# Patient Record
Sex: Female | Born: 1945
Health system: Southern US, Community
[De-identification: ages and names within clinical notes are randomized; demographics above are authoritative.]

## PROBLEM LIST (undated history)

## (undated) DIAGNOSIS — N189 Chronic kidney disease, unspecified: Secondary | ICD-10-CM

## (undated) DIAGNOSIS — Z8719 Personal history of other diseases of the digestive system: Secondary | ICD-10-CM

## (undated) DIAGNOSIS — K5792 Diverticulitis of intestine, part unspecified, without perforation or abscess without bleeding: Secondary | ICD-10-CM

## (undated) DIAGNOSIS — I8290 Acute embolism and thrombosis of unspecified vein: Secondary | ICD-10-CM

## (undated) DIAGNOSIS — K630 Abscess of intestine: Secondary | ICD-10-CM

## (undated) DIAGNOSIS — R42 Dizziness and giddiness: Secondary | ICD-10-CM

## (undated) DIAGNOSIS — K759 Inflammatory liver disease, unspecified: Secondary | ICD-10-CM

## (undated) DIAGNOSIS — H43399 Other vitreous opacities, unspecified eye: Secondary | ICD-10-CM

## (undated) DIAGNOSIS — R011 Cardiac murmur, unspecified: Secondary | ICD-10-CM

## (undated) DIAGNOSIS — J449 Chronic obstructive pulmonary disease, unspecified: Secondary | ICD-10-CM

## (undated) DIAGNOSIS — M1990 Unspecified osteoarthritis, unspecified site: Secondary | ICD-10-CM

## (undated) DIAGNOSIS — D573 Sickle-cell trait: Secondary | ICD-10-CM

## (undated) DIAGNOSIS — Z87440 Personal history of urinary (tract) infections: Secondary | ICD-10-CM

## (undated) DIAGNOSIS — E039 Hypothyroidism, unspecified: Secondary | ICD-10-CM

## (undated) DIAGNOSIS — Z9289 Personal history of other medical treatment: Secondary | ICD-10-CM

## (undated) DIAGNOSIS — I509 Heart failure, unspecified: Secondary | ICD-10-CM

## (undated) DIAGNOSIS — H269 Unspecified cataract: Secondary | ICD-10-CM

## (undated) DIAGNOSIS — E7800 Pure hypercholesterolemia, unspecified: Secondary | ICD-10-CM

## (undated) DIAGNOSIS — K219 Gastro-esophageal reflux disease without esophagitis: Secondary | ICD-10-CM

## (undated) DIAGNOSIS — J189 Pneumonia, unspecified organism: Secondary | ICD-10-CM

## (undated) DIAGNOSIS — F329 Major depressive disorder, single episode, unspecified: Secondary | ICD-10-CM

## (undated) DIAGNOSIS — E669 Obesity, unspecified: Secondary | ICD-10-CM

## (undated) DIAGNOSIS — R51 Headache: Secondary | ICD-10-CM

## (undated) DIAGNOSIS — I10 Essential (primary) hypertension: Secondary | ICD-10-CM

## (undated) DIAGNOSIS — F32A Depression, unspecified: Secondary | ICD-10-CM

## (undated) HISTORY — DX: Abscess of intestine: K63.0

## (undated) HISTORY — DX: Diverticulitis of intestine, part unspecified, without perforation or abscess without bleeding: K57.92

## (undated) HISTORY — DX: Heart failure, unspecified: I50.9

## (undated) HISTORY — DX: Obesity, unspecified: E66.9

## (undated) HISTORY — DX: Personal history of other medical treatment: Z92.89

## (undated) HISTORY — DX: Gastro-esophageal reflux disease without esophagitis: K21.9

---
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
## 1966-09-14 DIAGNOSIS — I8290 Acute embolism and thrombosis of unspecified vein: Secondary | ICD-10-CM

## 1966-09-14 HISTORY — DX: Acute embolism and thrombosis of unspecified vein: I82.90

## 2000-09-14 HISTORY — PX: CHOLECYSTECTOMY: SHX55

## 2007-04-07 LAB — HM COLONOSCOPY: HM Colonoscopy: NORMAL

## 2011-11-13 LAB — HM MAMMOGRAPHY

## 2013-02-20 ENCOUNTER — Encounter (HOSPITAL_COMMUNITY): Payer: Self-pay

## 2013-02-20 ENCOUNTER — Emergency Department (HOSPITAL_COMMUNITY)
Admission: EM | Admit: 2013-02-20 | Discharge: 2013-02-20 | Disposition: A | Discharge status: Home / Self Care | Payer: Medicare Other | Attending: Emergency Medicine | Admitting: Emergency Medicine

## 2013-02-20 DIAGNOSIS — R609 Edema, unspecified: Secondary | ICD-10-CM | POA: Insufficient documentation

## 2013-02-20 DIAGNOSIS — E039 Hypothyroidism, unspecified: Secondary | ICD-10-CM | POA: Diagnosis not present

## 2013-02-20 DIAGNOSIS — E7800 Pure hypercholesterolemia, unspecified: Secondary | ICD-10-CM | POA: Insufficient documentation

## 2013-02-20 DIAGNOSIS — D573 Sickle-cell trait: Secondary | ICD-10-CM | POA: Diagnosis not present

## 2013-02-20 DIAGNOSIS — R252 Cramp and spasm: Secondary | ICD-10-CM | POA: Insufficient documentation

## 2013-02-20 DIAGNOSIS — R110 Nausea: Secondary | ICD-10-CM | POA: Insufficient documentation

## 2013-02-20 DIAGNOSIS — F17200 Nicotine dependence, unspecified, uncomplicated: Secondary | ICD-10-CM | POA: Insufficient documentation

## 2013-02-20 DIAGNOSIS — R55 Syncope and collapse: Secondary | ICD-10-CM | POA: Insufficient documentation

## 2013-02-20 DIAGNOSIS — E876 Hypokalemia: Secondary | ICD-10-CM | POA: Diagnosis not present

## 2013-02-20 DIAGNOSIS — R635 Abnormal weight gain: Secondary | ICD-10-CM | POA: Insufficient documentation

## 2013-02-20 DIAGNOSIS — R61 Generalized hyperhidrosis: Secondary | ICD-10-CM | POA: Insufficient documentation

## 2013-02-20 DIAGNOSIS — I10 Essential (primary) hypertension: Secondary | ICD-10-CM | POA: Insufficient documentation

## 2013-02-20 DIAGNOSIS — R42 Dizziness and giddiness: Secondary | ICD-10-CM | POA: Diagnosis not present

## 2013-02-20 DIAGNOSIS — R5381 Other malaise: Secondary | ICD-10-CM | POA: Diagnosis not present

## 2013-02-20 DIAGNOSIS — R112 Nausea with vomiting, unspecified: Secondary | ICD-10-CM | POA: Diagnosis not present

## 2013-02-20 DIAGNOSIS — F329 Major depressive disorder, single episode, unspecified: Secondary | ICD-10-CM | POA: Diagnosis not present

## 2013-02-20 DIAGNOSIS — Z86718 Personal history of other venous thrombosis and embolism: Secondary | ICD-10-CM | POA: Diagnosis not present

## 2013-02-20 DIAGNOSIS — F3289 Other specified depressive episodes: Secondary | ICD-10-CM | POA: Insufficient documentation

## 2013-02-20 DIAGNOSIS — R404 Transient alteration of awareness: Secondary | ICD-10-CM | POA: Diagnosis not present

## 2013-02-20 DIAGNOSIS — R011 Cardiac murmur, unspecified: Secondary | ICD-10-CM | POA: Diagnosis not present

## 2013-02-20 HISTORY — DX: Pure hypercholesterolemia, unspecified: E78.00

## 2013-02-20 HISTORY — DX: Hypothyroidism, unspecified: E03.9

## 2013-02-20 HISTORY — DX: Depression, unspecified: F32.A

## 2013-02-20 HISTORY — DX: Major depressive disorder, single episode, unspecified: F32.9

## 2013-02-20 HISTORY — DX: Essential (primary) hypertension: I10

## 2013-02-20 HISTORY — DX: Sickle-cell trait: D57.3

## 2013-02-20 LAB — CBC WITH DIFFERENTIAL/PLATELET
Basophils Absolute: 0 10*3/uL (ref 0.0–0.1)
Basophils Relative: 0 % (ref 0–1)
Eosinophils Absolute: 0.2 10*3/uL (ref 0.0–0.7)
Eosinophils Relative: 2 % (ref 0–5)
HCT: 34.6 % — ABNORMAL LOW (ref 36.0–46.0)
Hemoglobin: 12.3 g/dL (ref 12.0–15.0)
Lymphocytes Relative: 15 % (ref 12–46)
Lymphs Abs: 1.4 10*3/uL (ref 0.7–4.0)
MCH: 33.5 pg (ref 26.0–34.0)
MCHC: 35.5 g/dL (ref 30.0–36.0)
MCV: 94.3 fL (ref 78.0–100.0)
Monocytes Absolute: 0.4 10*3/uL (ref 0.1–1.0)
Monocytes Relative: 4 % (ref 3–12)
Neutro Abs: 7.5 10*3/uL (ref 1.7–7.7)
Neutrophils Relative %: 79 % — ABNORMAL HIGH (ref 43–77)
Platelets: 202 10*3/uL (ref 150–400)
RBC: 3.67 MIL/uL — ABNORMAL LOW (ref 3.87–5.11)
RDW: 14.5 % (ref 11.5–15.5)
WBC: 9.5 10*3/uL (ref 4.0–10.5)

## 2013-02-20 LAB — URINALYSIS, ROUTINE W REFLEX MICROSCOPIC
Bilirubin Urine: NEGATIVE
Glucose, UA: NEGATIVE mg/dL
Hgb urine dipstick: NEGATIVE
Ketones, ur: NEGATIVE mg/dL
Nitrite: NEGATIVE
Protein, ur: NEGATIVE mg/dL
Specific Gravity, Urine: 1.009 (ref 1.005–1.030)
Urobilinogen, UA: 0.2 mg/dL (ref 0.0–1.0)
pH: 7 (ref 5.0–8.0)

## 2013-02-20 LAB — COMPREHENSIVE METABOLIC PANEL
ALT: 57 U/L — ABNORMAL HIGH (ref 0–35)
AST: 68 U/L — ABNORMAL HIGH (ref 0–37)
Albumin: 4.3 g/dL (ref 3.5–5.2)
Alkaline Phosphatase: 92 U/L (ref 39–117)
BUN: 10 mg/dL (ref 6–23)
CO2: 27 mEq/L (ref 19–32)
Calcium: 10.1 mg/dL (ref 8.4–10.5)
Chloride: 97 mEq/L (ref 96–112)
Creatinine, Ser: 1.65 mg/dL — ABNORMAL HIGH (ref 0.50–1.10)
GFR calc Af Amer: 36 mL/min — ABNORMAL LOW (ref >90)
GFR calc non Af Amer: 31 mL/min — ABNORMAL LOW (ref >90)
Glucose, Bld: 88 mg/dL (ref 70–99)
Potassium: 3.2 mEq/L — ABNORMAL LOW (ref 3.5–5.1)
Sodium: 139 mEq/L (ref 135–145)
Total Bilirubin: 1.1 mg/dL (ref 0.3–1.2)
Total Protein: 8.3 g/dL (ref 6.0–8.3)

## 2013-02-20 LAB — URINE MICROSCOPIC-ADD ON

## 2013-02-20 LAB — TROPONIN I: Troponin I: <0.3 ng/mL (ref <0.30)

## 2013-02-20 LAB — MAGNESIUM: Magnesium: 2.3 mg/dL (ref 1.5–2.5)

## 2013-02-20 LAB — PRO B NATRIURETIC PEPTIDE: Pro B Natriuretic peptide (BNP): 9.6 pg/mL (ref 0–125)

## 2013-02-20 MED ORDER — SODIUM CHLORIDE 0.9 % IV BOLUS (SEPSIS)
1000.0000 mL | Freq: Once | INTRAVENOUS | Status: AC
  Administered 2013-02-20: 1000 mL via INTRAVENOUS

## 2013-02-20 MED ORDER — POTASSIUM CHLORIDE CRYS ER 20 MEQ PO TBCR
40.0000 meq | EXTENDED_RELEASE_TABLET | Freq: Once | ORAL | Status: AC
  Administered 2013-02-20: 40 meq via ORAL
  Filled 2013-02-20: qty 2

## 2013-02-20 NOTE — ED Notes (Signed)
PND:LO31<AF> Expected date:<BR> Expected time:<BR> Means of arrival:<BR> Comments:<BR> Ems/ weakness,

## 2013-02-20 NOTE — ED Provider Notes (Addendum)
History     CSN: [id number]  Arrival date & time 02/20/13  1308   First MD Initiated Contact with Patient 02/20/13 [phone]      Chief Complaint  Patient presents with  . Dizziness  . Nausea    (Consider location/radiation/quality/duration/timing/severity/associated sxs/prior treatment) HPI Comments: Pt comes in with cc of dizziness. Pt has thyroid hx, HL, smoking hx. She is not hypertensive. The only meds she was on was a thyroid meds, she has not take it in a few days. Pt started having some diffuse swelling of her body, with weight gain, over the past few weeks. Pt started taking lasix 40 mg q daily for it. Today, she took 80 mg total. Reports going to the bathroom, and on her way, getting nauseated, dizzy - described as spinning and lightheadedness, got sweaty, and fell like she was going to loose balance, so she slowly sat down. She then urinated, and felt weak - called daughter. EMS had patient noted ot be in the 90s SBP at their arrival. She feels a lot better - EMS provided some fluids. She also c/o diffuse muscle cramps.  The history is provided by the patient.    Past Medical History  Diagnosis Date  . Hypertension   . Hypothyroid   . Depression   . Hypercholesteremia   . Sickle cell trait   . Embolus     Past Surgical History  Procedure Laterality Date  . Cholecystectomy    . Tonsillectomy      Family History  Problem Relation Age of Onset  . Hypertension Mother   . Aneurysm Mother   . Cancer Mother   . Cancer Other     History  Substance Use Topics  . Smoking status: Current Every Day Smoker -- 0.25 packs/day for 25 years    Types: Cigarettes  . Smokeless tobacco: Never Used  . Alcohol Use: No    OB History   Grav Para Term Preterm Abortions TAB SAB Ect Mult Living                  Review of Systems  Constitutional: Negative for activity change.  HENT: Negative for facial swelling and neck pain.   Respiratory: Negative for cough, shortness of  breath and wheezing.   Cardiovascular: Negative for chest pain and palpitations.  Gastrointestinal: Negative for nausea, vomiting, abdominal pain, diarrhea, constipation, blood in stool and abdominal distention.  Genitourinary: Negative for hematuria and difficulty urinating.  Skin: Negative for color change.  Neurological: Positive for dizziness and weakness. Negative for seizures, syncope and speech difficulty.  Hematological: Does not bruise/bleed easily.  Psychiatric/Behavioral: Negative for confusion.    Allergies  Estrogens  Home Medications  No current outpatient prescriptions on file.  BP 123/82  Pulse 88  Temp(Src) 97.4 F (36.3 C) (Oral)  Resp 18  SpO2 96%  Physical Exam  Nursing note and vitals reviewed. Constitutional: She is oriented to person, place, and time. She appears well-developed and well-nourished.  HENT:  Head: Normocephalic and atraumatic.  Eyes: EOM are normal. Pupils are equal, round, and reactive to light.  Neck: Neck supple. No JVD present.  Cardiovascular: Normal rate, regular rhythm and intact distal pulses.   Murmur heard. Pulmonary/Chest: Effort normal. No respiratory distress.  Abdominal: Soft. She exhibits no distension and no mass. There is no tenderness. There is no rebound and no guarding.  Musculoskeletal:  No pitting edema, symmetric swelling - if present.  Neurological: She is alert and oriented to person, place,  and time. No cranial nerve deficit. Coordination normal.  Cerebellar exam is normal (finger to nose) Sensory exam normal for bilateral upper and lower extremities - and patient is able to discriminate between sharp and dull. Motor exam is 4+/5   Skin: Skin is warm and dry.    ED Course  Procedures (including critical care time)  Labs Reviewed  CBC WITH DIFFERENTIAL  URINALYSIS, ROUTINE W REFLEX MICROSCOPIC  TROPONIN I  MAGNESIUM  COMPREHENSIVE METABOLIC PANEL  PRO B NATRIURETIC PEPTIDE  TSH   No results  found.   No diagnosis found.    MDM   Date: 02/20/2013  Rate: 67  Rhythm: normal sinus rhythm  QRS Axis: normal  Intervals: normal  ST/T Wave abnormalities: nonspecific ST/T changes  Conduction Disutrbances:none  Narrative Interpretation:   Old EKG Reviewed: none available  Pt comes in with cc of dizziness- near syncope and diffuse swelling x few months.  DDx includes: Orthostatic hypotension Stroke Vertebral artery dissection/stenosis Dysrhythmia PE Vasovagal/neurocardiogenic syncope Aortic stenosis Valvular disorder/Cardiomyopathy Anemia   Pt appeared to have had an orthostatic event or neurocardiogenic / vasovagal event. The prodrome appears to make the latter more likely. Low Bp at EMS arrival, makes dehydration and orthostatics possible as well - especially in light of her self medicating.  Finally thyroid dx is possible cause of the swelling.  We will hydrate, get basic labs and screening ekg and torponin. Pt has an appt with PCP in 1 week. Will get TSH ordered  - pcp can follow it.  I dont think this was a TIA event - her ABCD2 score would be 1 if it was, appropriate for outpatient workup.     Varney Biles, MD 02/20/13 [place], MD 02/20/13 1736

## 2013-02-20 NOTE — Progress Notes (Signed)
   CARE MANAGEMENT ED NOTE 02/20/2013  Patient:  Valerie Jackson, Valerie Jackson   Account Number:  [id number]  Date Initiated:  02/20/2013  Documentation initiated by:  Livia Snellen  Subjective/Objective Assessment:   Patient presented to ED with swelling to face and hands.     Subjective/Objective Assessment Detail:     Action/Plan:   Action/Plan Detail:   Anticipated DC Date:       Status Recommendation to Physician:   Result of Recommendation:    Other ED [place]  Other  PCP issues    Choice offered to / List presented to:            Status of service:  Completed, signed off  ED Comments:   ED Comments Detail:  Patient listed as not having a PCP.  EDCM spoke with patient who confirmed that she did not have a PCP as of yet since she just moved here a few months ago from [place]. Patient stated that she has an appointment with the nurse practioner at [facility] on [address] (she could not remember the name of the NP).  Patinet intends on making this group her PCP.  No further needs at this time.

## 2013-02-20 NOTE — ED Notes (Signed)
Per EMS- Patient c/o having swelling to hands and face. Patient self-medicated with Lasix 40 mg at 0700 and repeated at 1140. Patient then began to  Have dizziness, muscle cramping, and nausea. Patient denies COPD and CHF history. Patient given 437m NS IV per EMS.

## 2013-02-21 LAB — TSH: TSH: 67.77 u[IU]/mL — ABNORMAL HIGH (ref 0.350–4.500)

## 2013-02-27 ENCOUNTER — Ambulatory Visit (INDEPENDENT_AMBULATORY_CARE_PROVIDER_SITE_OTHER): Payer: Medicare Other | Admitting: Internal Medicine

## 2013-02-27 ENCOUNTER — Encounter: Payer: Self-pay | Admitting: Internal Medicine

## 2013-02-27 ENCOUNTER — Other Ambulatory Visit (INDEPENDENT_AMBULATORY_CARE_PROVIDER_SITE_OTHER): Payer: Medicare Other

## 2013-02-27 VITALS — BP 122/88 | HR 78 | Temp 97.6°F | Ht 65.5 in | Wt 206.8 lb

## 2013-02-27 DIAGNOSIS — I10 Essential (primary) hypertension: Secondary | ICD-10-CM

## 2013-02-27 DIAGNOSIS — E785 Hyperlipidemia, unspecified: Secondary | ICD-10-CM

## 2013-02-27 DIAGNOSIS — E876 Hypokalemia: Secondary | ICD-10-CM | POA: Diagnosis not present

## 2013-02-27 DIAGNOSIS — E039 Hypothyroidism, unspecified: Secondary | ICD-10-CM | POA: Diagnosis not present

## 2013-02-27 DIAGNOSIS — R739 Hyperglycemia, unspecified: Secondary | ICD-10-CM

## 2013-02-27 DIAGNOSIS — R600 Localized edema: Secondary | ICD-10-CM

## 2013-02-27 DIAGNOSIS — R7309 Other abnormal glucose: Secondary | ICD-10-CM | POA: Diagnosis not present

## 2013-02-27 DIAGNOSIS — R609 Edema, unspecified: Secondary | ICD-10-CM

## 2013-02-27 LAB — COMPREHENSIVE METABOLIC PANEL
ALT: 55 U/L — ABNORMAL HIGH (ref 0–35)
AST: 69 U/L — ABNORMAL HIGH (ref 0–37)
Albumin: 4.1 g/dL (ref 3.5–5.2)
Alkaline Phosphatase: 66 U/L (ref 39–117)
BUN: 9 mg/dL (ref 6–23)
CO2: 27 mEq/L (ref 19–32)
Calcium: 10 mg/dL (ref 8.4–10.5)
Chloride: 105 mEq/L (ref 96–112)
Creatinine, Ser: 1.9 mg/dL — ABNORMAL HIGH (ref 0.4–1.2)
GFR: 33.81 mL/min — ABNORMAL LOW (ref >60.00)
Glucose, Bld: 91 mg/dL (ref 70–99)
Potassium: 3.1 mEq/L — ABNORMAL LOW (ref 3.5–5.1)
Sodium: 142 mEq/L (ref 135–145)
Total Bilirubin: 0.9 mg/dL (ref 0.3–1.2)
Total Protein: 7.3 g/dL (ref 6.0–8.3)

## 2013-02-27 LAB — CBC
HCT: 30.8 % — ABNORMAL LOW (ref 36.0–46.0)
Hemoglobin: 10.5 g/dL — ABNORMAL LOW (ref 12.0–15.0)
MCHC: 34.2 g/dL (ref 30.0–36.0)
MCV: 98.9 fl (ref 78.0–100.0)
Platelets: 188 10*3/uL (ref 150.0–400.0)
RBC: 3.11 Mil/uL — ABNORMAL LOW (ref 3.87–5.11)
RDW: 15 % — ABNORMAL HIGH (ref 11.5–14.6)
WBC: 6.9 10*3/uL (ref 4.5–10.5)

## 2013-02-27 LAB — T4, FREE: Free T4: 0 ng/dL — ABNORMAL LOW (ref 0.60–1.60)

## 2013-02-27 LAB — LIPID PANEL
Cholesterol: 338 mg/dL — ABNORMAL HIGH (ref 0–200)
HDL: 48 mg/dL (ref >39.00)
Total CHOL/HDL Ratio: 7
Triglycerides: 309 mg/dL — ABNORMAL HIGH (ref 0.0–149.0)
VLDL: 61.8 mg/dL — ABNORMAL HIGH (ref 0.0–40.0)

## 2013-02-27 LAB — T3: T3, Total: <10 ng/dL — ABNORMAL LOW (ref 80.0–204.0)

## 2013-02-27 LAB — HEMOGLOBIN A1C: Hgb A1c MFr Bld: 4.7 % (ref 4.6–6.5)

## 2013-02-27 LAB — LDL CHOLESTEROL, DIRECT: Direct LDL: 226 mg/dL

## 2013-02-27 NOTE — Progress Notes (Signed)
HPI  Pt presents to the clinic today to establish care. She recently moved from [place]. She has not seen her PCP in [place] for a year prior to moving. She does have some concerns today about total body swelling. This started approximately 1 month ago. She self medicated with Lasix which did seem to bring some of the swelling down. She was seen in the ER 02/20/2013 and found to be hypotensive, hypokalemic and dehydrated likely due to overuse of Lasix which was not prescribed to her. She reports that she does not understand the reason for the swelling. She has no known heart issue, having a normal echo in 2011. She has never had any renal insufficiency. Additionally, she also has a history of hypothyroidism. She self d/c'd meds in Sept 2013. At the ER recently her TSH was 67.  Flu: 2012 TD: 2013 Pneumovax: never Eye doctor: yearly Dentist: yearly Pap Smear: no longer screening Mammogram: 2013 Colonoscopy: more than 5 years ago (does not want to repeat)  Past Medical History  Diagnosis Date  . Hypertension   . Hypothyroid   . Depression   . Hypercholesteremia   . Sickle cell trait   . Embolus   . GERD (gastroesophageal reflux disease)   . History of blood transfusion     No current outpatient prescriptions on file.   No current facility-administered medications for this visit.    Allergies  Allergen Reactions  . Estrogens     Blood clot in leg    Family History  Problem Relation Age of Onset  . Hypertension Mother   . Aneurysm Mother   . Cancer Mother     Uterine  . Thyroid cancer Mother   . Cancer Other   . Thyroid cancer Sister   . Arthritis Sister   . Hyperlipidemia Sister   . Hypertension Sister   . Stroke Sister   . Kidney disease Sister   . Hyperlipidemia Brother   . Thyroid cancer Daughter   . Breast cancer Maternal Grandmother   . Hypertension Maternal Grandmother     History   Social History  . Marital Status: Married    Spouse Name: N/A    Number  of Children: 1  . Years of Education: 14   Occupational History  . Retired     Therapist, sports   Social History Main Topics  . Smoking status: Current Every Day Smoker -- 0.25 packs/day for 25 years    Types: Cigarettes  . Smokeless tobacco: Never Used  . Alcohol Use: No  . Drug Use: No  . Sexually Active: Not on file   Other Topics Concern  . Not on file   Social History Narrative   Regular exercise-no   Caffeine Use-yes    ROS:  Constitutional: Denies fever, malaise, fatigue, headache or abrupt weight changes.  HEENT: Pt reports floaters. Denies eye pain, eye redness, ear pain, ringing in the ears, wax buildup, runny nose, nasal congestion, bloody nose, or sore throat. Respiratory: Denies difficulty breathing, shortness of breath, cough or sputum production.   Cardiovascular: Pt reports generalized swelling. Denies chest pain, chest tightness, palpitations.  Gastrointestinal: Denies abdominal pain, bloating, constipation, diarrhea or blood in the stool.  GU: Denies frequency, urgency, pain with urination, blood in urine, odor or discharge. Musculoskeletal: Denies decrease in range of motion, difficulty with gait, muscle pain or joint pain and swelling.  Skin: Denies redness, rashes, lesions or ulcercations.  Neurological: Denies dizziness, difficulty with memory, difficulty with speech or problems  with balance and coordination.   No other specific complaints in a complete review of systems (except as listed in HPI above).  PE:  BP 122/88  Pulse 78  Temp(Src) 97.6 F (36.4 C) (Oral)  Ht 5' 5.5" (1.664 m)  Wt 206 lb 12 oz (93.781 kg)  BMI 33.87 kg/m2  SpO2 96% Wt Readings from Last 3 Encounters:  02/27/13 206 lb 12 oz (93.781 kg)    General: Appears her stated age, obese but  well developed, well nourished in NAD. HEENT: Head: normal shape and size; Eyes: sclera white, no icterus, conjunctiva pink, PERRLA and EOMs intact; Ears: Tm's gray and intact, normal light reflex; Nose:  mucosa pink and moist, septum midline; Throat/Mouth: Teeth present, mucosa pink and moist, no lesions or ulcerations noted.  Neck: Normal range of motion. Neck supple, trachea midline. No massses, lumps or thyromegaly present.  Cardiovascular: Normal rate and rhythm. S1,S2 noted.  No murmur, rubs or gallops noted. No JVD, 1+ pitting edema of BLE.[name] No carotid bruits noted. Pulmonary/Chest: Normal effort and positive vesicular breath sounds. No respiratory distress. No wheezes, rales or ronchi noted.  Abdomen: Soft and nontender. Normal bowel sounds, no bruits noted. No distention or masses noted. Liver, spleen and kidneys non palpable. Musculoskeletal: Normal range of motion. No signs of joint swelling. No difficulty with gait.  Neurological: Alert and oriented. Cranial nerves II-XII intact. Coordination normal. +DTRs bilaterally. Psychiatric: Mood and affect normal. Behavior is normal. Judgment and thought content normal.     BMET    Component Value Date/Time   NA 139 02/20/2013 1600   K 3.2* 02/20/2013 1600   CL 97 02/20/2013 1600   CO2 27 02/20/2013 1600   GLUCOSE 88 02/20/2013 1600   BUN 10 02/20/2013 1600   CREATININE 1.65* 02/20/2013 1600   CALCIUM 10.1 02/20/2013 1600   GFRNONAA 31* 02/20/2013 1600   GFRAA 36* 02/20/2013 1600    Lipid Panel  No results found for this basename: chol, trig, hdl, cholhdl, vldl, ldlcalc    CBC    Component Value Date/Time   WBC 9.5 02/20/2013 1600   RBC 3.67* 02/20/2013 1600   HGB 12.3 02/20/2013 1600   HCT 34.6* 02/20/2013 1600   PLT 202 02/20/2013 1600   MCV 94.3 02/20/2013 1600   MCH 33.5 02/20/2013 1600   MCHC 35.5 02/20/2013 1600   RDW 14.5 02/20/2013 1600   LYMPHSABS 1.4 02/20/2013 1600   MONOABS 0.4 02/20/2013 1600   EOSABS 0.2 02/20/2013 1600   BASOSABS 0.0 02/20/2013 1600    Hgb A1C No results found for this basename: HGBA1C     Assessment and Plan:  Preventative Health:  Pt declines repeat colonoscopy, pap smear or pneumovax Continue to work on diet and  exercise

## 2013-02-27 NOTE — Patient Instructions (Signed)

## 2013-02-28 ENCOUNTER — Telehealth: Payer: Self-pay | Admitting: *Deleted

## 2013-02-28 DIAGNOSIS — E785 Hyperlipidemia, unspecified: Secondary | ICD-10-CM | POA: Insufficient documentation

## 2013-02-28 DIAGNOSIS — I10 Essential (primary) hypertension: Secondary | ICD-10-CM | POA: Insufficient documentation

## 2013-02-28 DIAGNOSIS — E039 Hypothyroidism, unspecified: Secondary | ICD-10-CM | POA: Insufficient documentation

## 2013-02-28 DIAGNOSIS — E876 Hypokalemia: Secondary | ICD-10-CM | POA: Insufficient documentation

## 2013-02-28 NOTE — Assessment & Plan Note (Signed)
Will recheck lipid profile today Continue to work on diet and exercise.

## 2013-02-28 NOTE — Telephone Encounter (Signed)
Pt wanted to let you know that the hypertensive diuretic she was previously taking was Avalide.

## 2013-02-28 NOTE — Assessment & Plan Note (Signed)
TSH elevated Will recheck T3 and free T4 If elevated will restart synthroid

## 2013-02-28 NOTE — Assessment & Plan Note (Signed)
Will repeat BMET today Stay off medications that are not prescribed for you

## 2013-02-28 NOTE — Assessment & Plan Note (Signed)
Well controlled without medication Continue to monitor

## 2013-03-03 ENCOUNTER — Other Ambulatory Visit: Payer: Self-pay | Admitting: Internal Medicine

## 2013-03-03 ENCOUNTER — Telehealth: Payer: Self-pay

## 2013-03-03 MED ORDER — LEVOTHYROXINE SODIUM 100 MCG PO TABS: 100.0000 ug | ORAL_TABLET | Freq: Every day | ORAL | Status: DC

## 2013-03-03 MED ORDER — SIMVASTATIN 10 MG PO TABS: 10.0000 mg | ORAL_TABLET | Freq: Every day | ORAL | Status: DC

## 2013-03-03 NOTE — Telephone Encounter (Signed)
I have called in statin and synthroid. I need to see her back in 1 month to recheck TSH

## 2013-03-03 NOTE — Telephone Encounter (Signed)
Patient advised per NP and would like a statin drug sent to her pharmacy. "She also stated that her pervious synthroid dose was 0.1 mcg, so NP may want to increase it."  Previous message---->Please call pt and let her know her cholesterol and triglycerides are very elevated. I want to know if she would like to try 3 months of lifestyle changes or start on a statin. All t3 and t4 are very low. I recommend she restart her synthroid. I need t oknow what dose she was on prior. All other labs were normal or stable.

## 2013-03-03 NOTE — Telephone Encounter (Signed)
Pt.notified

## 2013-03-21 DIAGNOSIS — H02839 Dermatochalasis of unspecified eye, unspecified eyelid: Secondary | ICD-10-CM | POA: Diagnosis not present

## 2013-03-21 DIAGNOSIS — H35379 Puckering of macula, unspecified eye: Secondary | ICD-10-CM | POA: Diagnosis not present

## 2013-03-21 DIAGNOSIS — H2510 Age-related nuclear cataract, unspecified eye: Secondary | ICD-10-CM | POA: Diagnosis not present

## 2013-03-21 DIAGNOSIS — H43819 Vitreous degeneration, unspecified eye: Secondary | ICD-10-CM | POA: Diagnosis not present

## 2013-04-12 ENCOUNTER — Other Ambulatory Visit: Payer: Medicare Other

## 2013-04-12 ENCOUNTER — Ambulatory Visit (INDEPENDENT_AMBULATORY_CARE_PROVIDER_SITE_OTHER): Payer: Medicare Other | Admitting: Internal Medicine

## 2013-04-12 ENCOUNTER — Encounter: Payer: Self-pay | Admitting: Internal Medicine

## 2013-04-12 VITALS — BP 112/74 | HR 92 | Temp 97.9°F | Wt 189.0 lb

## 2013-04-12 DIAGNOSIS — R102 Pelvic and perineal pain: Secondary | ICD-10-CM

## 2013-04-12 DIAGNOSIS — E669 Obesity, unspecified: Secondary | ICD-10-CM | POA: Insufficient documentation

## 2013-04-12 DIAGNOSIS — R109 Unspecified abdominal pain: Secondary | ICD-10-CM

## 2013-04-12 DIAGNOSIS — N949 Unspecified condition associated with female genital organs and menstrual cycle: Secondary | ICD-10-CM

## 2013-04-12 LAB — POCT URINALYSIS DIPSTICK
Bilirubin, UA: NEGATIVE
Blood, UA: NEGATIVE
Glucose, UA: NEGATIVE
Ketones, UA: NEGATIVE
Nitrite, UA: NEGATIVE
Protein, UA: 30
Spec Grav, UA: 1.01
Urobilinogen, UA: 0.2
pH, UA: 6.5

## 2013-04-12 MED ORDER — CIPROFLOXACIN HCL 500 MG PO TABS: 500.0000 mg | ORAL_TABLET | Freq: Two times a day (BID) | ORAL | Status: DC

## 2013-04-12 MED ORDER — HYDROCODONE-ACETAMINOPHEN 5-325 MG PO TABS: 1.0000 | ORAL_TABLET | Freq: Four times a day (QID) | ORAL | Status: DC | PRN

## 2013-04-12 NOTE — Assessment & Plan Note (Deleted)
Slightly elevated today Pt will discuss this with cardiology tomorrow Encouraged pt to maintain a low sodium diet and to work on exercise Will see if cardiology will draw CMET

## 2013-04-12 NOTE — Assessment & Plan Note (Signed)
Start a 2000 calorie low fat low sodium diet Try to get in aeroic exercise 3 days per week

## 2013-04-12 NOTE — Assessment & Plan Note (Deleted)
Will see if cardiology can draw TSH for me

## 2013-04-12 NOTE — Patient Instructions (Signed)
Pelvic Pain, Female Female pelvic pain can be caused by many different things and start from a variety of places. Pelvic pain refers to pain that is located in the lower half of the abdomen and between your hips. The pain may occur over a short period of time (acute) or may be reoccurring (chronic). The cause of pelvic pain may be related to disorders affecting the female reproductive organs (gynecologic), but it may also be related to the bladder, kidney stones, an intestinal complication, or muscle or skeletal problems. Getting help right away for pelvic pain is important, especially if there has been severe, sharp, or a sudden onset of unusual pain. It is also important to get help right away because some types of pelvic pain can be life threatening.  CAUSES  Below are only some of the causes of pelvic pain. The causes of pelvic pain can be in one of several categories.   Gynecologic.  Pelvic inflammatory disease.  Sexually transmitted infection.  Ovarian cyst or a twisted ovarian ligament (ovarian torsion).  Uterine lining that grows outside the uterus (endometriosis).  Fibroids, cysts, or tumors.  Ovulation.  Pregnancy.  Pregnancy that occurs outside the uterus (ectopic pregnancy).  Miscarriage.  Labor.  Abruption of the placenta or ruptured uterus.  Infection.  Uterine infection (endometritis).  Bladder infection.  Diverticulitis.  Miscarriage related to a uterine infection (septic abortion).  Bladder.  Inflammation of the bladder (cystitis).  Kidney stone(s).  Gastrointenstinal.  Constipation.  Diverticulitis.  Neurologic.  Trauma.  Feeling pelvic pain because of mental or emotional causes (psychosomatic).  Cancers of the bowel or pelvis. EVALUATION  Your caregiver will want to take a careful history of your concerns. This includes recent changes in your health, a careful gynecologic history of your periods (menses), and a sexual history. Obtaining  your family history and medical history is also important. Your caregiver may suggest a pelvic exam. A pelvic exam will help identify the location and severity of the pain. It also helps in the evaluation of which organ system may be involved. In order to identify the cause of the pelvic pain and be properly treated, your caregiver may order tests. These tests may include:   A pregnancy test.  Pelvic ultrasonography.  An X-ray exam of the abdomen.  A urinalysis or evaluation of vaginal discharge.  Blood tests. HOME CARE INSTRUCTIONS   Only take over-the-counter or prescription medicines for pain, discomfort, or fever as directed by your caregiver.   Rest as directed by your caregiver.   Eat a balanced diet.   Drink enough fluids to make your urine clear or pale yellow, or as directed.   Avoid sexual intercourse if it causes pain.   Apply warm or cold compresses to the lower abdomen depending on which one helps the pain.   Avoid stressful situations.   Keep a journal of your pelvic pain. Write down when it started, where the pain is located, and if there are things that seem to be associated with the pain, such as food or your menstrual cycle.  Follow up with your caregiver as directed.  SEEK MEDICAL CARE IF:  Your medicine does not help your pain.  You have abnormal vaginal discharge. SEEK IMMEDIATE MEDICAL CARE IF:   You have heavy bleeding from the vagina.   Your pelvic pain increases.   You feel lightheaded or faint.   You have chills.   You have pain with urination or blood in your urine.   You have uncontrolled  diarrhea or vomiting.   You have a fever or persistent symptoms for more than 3 days.  You have a fever and your symptoms suddenly get worse.   You are being physically or sexually abused.  MAKE SURE YOU:  Understand these instructions.  Will watch your condition.  Will get help if you are not doing well or get worse. Document  Released: 07/28/2004 Document Revised: 03/01/2012 Document Reviewed: 12/21/2011 [facility] Patient Information 2014 [place], [place].

## 2013-04-12 NOTE — Progress Notes (Signed)
HPI  Pt presents to the clinic today with c/o pelvic pain. This started Monday. She did have some associated nausea but no diarrhea of vomiting. She is having normal BM's. She has not noticed any blood in her stool. She did take some of her sisters vicodin which did help relieve the pain. She does c/o urgency butthis is a chronic issue for her and she is not taking her Detrol LA. She denies vaginal pain, bleeding, discharge or odor.  Past Medical History  Diagnosis Date  . Hypertension   . Hypothyroid   . Depression   . Hypercholesteremia   . Sickle cell trait   . Embolus   . GERD (gastroesophageal reflux disease)   . History of blood transfusion     Current Outpatient Prescriptions  Medication Sig Dispense Refill  . levothyroxine (SYNTHROID, LEVOTHROID) 100 MCG tablet Take 1 tablet (100 mcg total) by mouth daily.  90 tablet  3  . simvastatin (ZOCOR) 10 MG tablet Take 1 tablet (10 mg total) by mouth at bedtime.  90 tablet  3  . ciprofloxacin (CIPRO) 500 MG tablet Take 1 tablet (500 mg total) by mouth 2 (two) times daily.  6 tablet  0  . HYDROcodone-acetaminophen (NORCO/VICODIN) 5-325 MG per tablet Take 1 tablet by mouth every 6 (six) hours as needed for pain.  30 tablet  0   No current facility-administered medications for this visit.    Allergies  Allergen Reactions  . Estrogens     Blood clot in leg    Family History  Problem Relation Age of Onset  . Hypertension Mother   . Aneurysm Mother   . Cancer Mother     Uterine  . Thyroid cancer Mother   . Cancer Other   . Thyroid cancer Sister   . Arthritis Sister   . Hyperlipidemia Sister   . Hypertension Sister   . Stroke Sister   . Kidney disease Sister   . Hyperlipidemia Brother   . Thyroid cancer Daughter   . Breast cancer Maternal Grandmother   . Hypertension Maternal Grandmother     History   Social History  . Marital Status: Married    Spouse Name: N/A    Number of Children: 1  . Years of Education: 14    Occupational History  . Retired     Therapist, sports   Social History Main Topics  . Smoking status: Current Every Day Smoker -- 0.25 packs/day for 25 years    Types: Cigarettes  . Smokeless tobacco: Never Used  . Alcohol Use: No  . Drug Use: No  . Sexually Active: Yes   Other Topics Concern  . Not on file   Social History Narrative   Regular exercise-no   Caffeine Use-yes    ROS:  Constitutional: Denies fever, malaise, fatigue, headache or abrupt weight changes.  Gastrointestinal: Denies abdominal pain, bloating, constipation, diarrhea or blood in the stool.  GU: Pt reports urgency. Denies frequency, urgency, pain with urination, blood in urine, odor or discharge.   No other specific complaints in a complete review of systems (except as listed in HPI above).  PE:  BP 112/74  Pulse 92  Temp(Src) 97.9 F (36.6 C) (Oral)  Wt 189 lb (85.73 kg)  BMI 30.96 kg/m2  SpO2 98% Wt Readings from Last 3 Encounters:  04/12/13 189 lb (85.73 kg)  02/27/13 206 lb 12 oz (93.781 kg)    General: Appears her stated age, well developed, well nourished in NAD. Cardiovascular: Normal rate and  rhythm. S1,S2 noted.  No murmur, rubs or gallops noted. No JVD or BLE edema. No carotid bruits noted. Pulmonary/Chest: Normal effort and positive vesicular breath sounds. No respiratory distress. No wheezes, rales or ronchi noted.  Abdomen: Soft and tender in the pelvic region. Normal bowel sounds, no bruits noted. No distention or masses noted. Liver, spleen and kidneys non palpable. Musculoskeletal: Normal range of motion. No signs of joint swelling. No difficulty with gait.    BMET    Component Value Date/Time   NA 142 02/27/2013 1558   K 3.1* 02/27/2013 1558   CL 105 02/27/2013 1558   CO2 27 02/27/2013 1558   GLUCOSE 91 02/27/2013 1558   BUN 9 02/27/2013 1558   CREATININE 1.9* 02/27/2013 1558   CALCIUM 10.0 02/27/2013 1558   GFRNONAA 31* 02/20/2013 1600   GFRAA 36* 02/20/2013 1600    Lipid Panel      Component Value Date/Time   CHOL 338* 02/27/2013 1558   TRIG 309.0* 02/27/2013 1558   HDL 48.00 02/27/2013 1558   CHOLHDL 7 02/27/2013 1558   VLDL 61.8* 02/27/2013 1558    CBC    Component Value Date/Time   WBC 6.9 02/27/2013 1558   RBC 3.11* 02/27/2013 1558   HGB 10.5* 02/27/2013 1558   HCT 30.8* 02/27/2013 1558   PLT 188.0 02/27/2013 1558   MCV 98.9 02/27/2013 1558   MCH 33.5 02/20/2013 1600   MCHC 34.2 02/27/2013 1558   RDW 15.0* 02/27/2013 1558   LYMPHSABS 1.4 02/20/2013 1600   MONOABS 0.4 02/20/2013 1600   EOSABS 0.2 02/20/2013 1600   BASOSABS 0.0 02/20/2013 1600    Hgb A1C Lab Results  Component Value Date   HGBA1C 4.7 02/27/2013     Assessment and Plan:  Pelvic pain, bilateral, new onset with additional workup required:  Will check urinalysis to r/o infection Moderate leuks- will start cipro and send urine for culture Will obtain pelvic ultrasound eRx for Norco  RTC as needed or if symptoms persist or worsen

## 2013-04-12 NOTE — Assessment & Plan Note (Deleted)
Consume a low fat diet Work on diet and exercise Encouraged pt to quit smoking Will have cardiology draw lipid panel

## 2013-04-13 ENCOUNTER — Ambulatory Visit
Admission: RE | Admit: 2013-04-13 | Discharge: 2013-04-13 | Disposition: A | Discharge status: Home / Self Care | Payer: Medicare Other | Source: Ambulatory Visit | Attending: Internal Medicine | Admitting: Internal Medicine

## 2013-04-13 DIAGNOSIS — R102 Pelvic and perineal pain: Secondary | ICD-10-CM

## 2013-04-13 DIAGNOSIS — N859 Noninflammatory disorder of uterus, unspecified: Secondary | ICD-10-CM | POA: Diagnosis not present

## 2013-04-13 LAB — URINE CULTURE
Colony Count: NO GROWTH
Organism ID, Bacteria: NO GROWTH

## 2013-04-17 ENCOUNTER — Encounter: Payer: Self-pay | Admitting: Internal Medicine

## 2013-04-17 ENCOUNTER — Other Ambulatory Visit (INDEPENDENT_AMBULATORY_CARE_PROVIDER_SITE_OTHER): Payer: Medicare Other

## 2013-04-17 ENCOUNTER — Telehealth: Payer: Self-pay

## 2013-04-17 ENCOUNTER — Ambulatory Visit (INDEPENDENT_AMBULATORY_CARE_PROVIDER_SITE_OTHER): Payer: Medicare Other | Admitting: Internal Medicine

## 2013-04-17 ENCOUNTER — Encounter: Payer: Self-pay | Admitting: *Deleted

## 2013-04-17 VITALS — BP 102/78 | HR 93 | Temp 97.9°F | Wt 190.0 lb

## 2013-04-17 DIAGNOSIS — E039 Hypothyroidism, unspecified: Secondary | ICD-10-CM

## 2013-04-17 DIAGNOSIS — N8500 Endometrial hyperplasia, unspecified: Secondary | ICD-10-CM | POA: Diagnosis not present

## 2013-04-17 DIAGNOSIS — N390 Urinary tract infection, site not specified: Secondary | ICD-10-CM

## 2013-04-17 LAB — TSH: TSH: 0.92 u[IU]/mL (ref 0.35–5.50)

## 2013-04-17 LAB — POCT URINALYSIS DIPSTICK
Bilirubin, UA: NEGATIVE
Glucose, UA: NEGATIVE
Ketones, UA: NEGATIVE
Leukocytes, UA: NEGATIVE
Nitrite, UA: NEGATIVE
Protein, UA: 2000
Spec Grav, UA: 1.015
Urobilinogen, UA: 0.2
pH, UA: 5

## 2013-04-17 MED ORDER — LEVOTHYROXINE SODIUM 100 MCG PO TABS: 100.0000 ug | ORAL_TABLET | Freq: Every day | ORAL | Status: DC

## 2013-04-17 MED ORDER — SIMVASTATIN 10 MG PO TABS: 10.0000 mg | ORAL_TABLET | Freq: Every day | ORAL | Status: DC

## 2013-04-17 NOTE — Progress Notes (Signed)
Subjective:    Patient ID: Valerie Jackson, female    DOB: 03/20/1946, 67 y.o.   MRN: [id number]  HPI  Pt presents to the clinic today with c/o recurrent pelvic pain and pressure. This originally started about 1 week ago. She was treated for a UTI. Urine culture did come back negative. The pain is intermittent. She denies urgency, frequency, abnormal BM's, or abnormal vaginal bleeding. She has not had a pap smear in about 20 years ago. She did have a pelvic ultrasound last week as well. Additionally, she is here to f/u thyroid. In June, her TSH was 63. She was off of her synthroid at that time. We restarted her synthroid at that time. She would like her TSH rechecked today.  Review of Systems      Past Medical History  Diagnosis Date  . Hypertension   . Hypothyroid   . Depression   . Hypercholesteremia   . Sickle cell trait   . Embolus   . GERD (gastroesophageal reflux disease)   . History of blood transfusion     Current Outpatient Prescriptions  Medication Sig Dispense Refill  . HYDROcodone-acetaminophen (NORCO/VICODIN) 5-325 MG per tablet Take 1 tablet by mouth every 6 (six) hours as needed for pain.  30 tablet  0  . levothyroxine (SYNTHROID, LEVOTHROID) 100 MCG tablet Take 1 tablet (100 mcg total) by mouth daily.  90 tablet  3  . simvastatin (ZOCOR) 10 MG tablet Take 1 tablet (10 mg total) by mouth at bedtime.  90 tablet  3   No current facility-administered medications for this visit.    Allergies  Allergen Reactions  . Estrogens     Blood clot in leg    Family History  Problem Relation Age of Onset  . Hypertension Mother   . Aneurysm Mother   . Cancer Mother     Uterine  . Thyroid cancer Mother   . Cancer Other   . Thyroid cancer Sister   . Arthritis Sister   . Hyperlipidemia Sister   . Hypertension Sister   . Stroke Sister   . Kidney disease Sister   . Hyperlipidemia Brother   . Thyroid cancer Daughter   . Breast cancer Maternal Grandmother   .  Hypertension Maternal Grandmother     History   Social History  . Marital Status: Married    Spouse Name: N/A    Number of Children: 1  . Years of Education: 14   Occupational History  . Retired     Therapist, sports   Social History Main Topics  . Smoking status: Current Every Day Smoker -- 0.25 packs/day for 25 years    Types: Cigarettes  . Smokeless tobacco: Never Used  . Alcohol Use: No  . Drug Use: No  . Sexually Active: Yes   Other Topics Concern  . Not on file   Social History Narrative   Regular exercise-no   Caffeine Use-yes     Constitutional: Denies fever, malaise, fatigue, headache or abrupt weight changes. .  Gastrointestinal: Denies abdominal pain, bloating, constipation, diarrhea or blood in the stool.  GU: Pt reports pelvic pain. Denies urgency, frequency, pain with urination, burning sensation, blood in urine, odor or discharge. Skin: Denies redness, rashes, lesions or ulcercations.  Neurological: Denies dizziness, difficulty with memory, difficulty with speech or problems with balance and coordination.   No other specific complaints in a complete review of systems (except as listed in HPI above).  Objective:   Physical Exam   BP 102/78  Pulse 93  Temp(Src) 97.9 F (36.6 C) (Oral)  Wt 190 lb (86.183 kg)  BMI 31.13 kg/m2  SpO2 97% Wt Readings from Last 3 Encounters:  04/17/13 190 lb (86.183 kg)  04/12/13 189 lb (85.73 kg)  02/27/13 206 lb 12 oz (93.781 kg)    General: Appears her stated age, well developed, well nourished in NAD. Skin: Warm, dry and intact. No rashes, lesions or ulcerations noted. Cardiovascular: Normal rate and rhythm. S1,S2 noted.  No murmur, rubs or gallops noted. No JVD or BLE edema. No carotid bruits noted. Pulmonary/Chest: Normal effort and positive vesicular breath sounds. No respiratory distress. No wheezes, rales or ronchi noted.  Abdomen: Soft and tender in the pelvic region. Normal bowel sounds, no bruits noted. No distention  or masses noted. Liver, spleen and kidneys non palpable..  Neurological: Alert and oriented. Cranial nerves II-XII intact. Coordination normal. +DTRs bilaterally.   BMET    Component Value Date/Time   NA 142 02/27/2013 1558   K 3.1* 02/27/2013 1558   CL 105 02/27/2013 1558   CO2 27 02/27/2013 1558   GLUCOSE 91 02/27/2013 1558   BUN 9 02/27/2013 1558   CREATININE 1.9* 02/27/2013 1558   CALCIUM 10.0 02/27/2013 1558   GFRNONAA 31* 02/20/2013 1600   GFRAA 36* 02/20/2013 1600    Lipid Panel     Component Value Date/Time   CHOL 338* 02/27/2013 1558   TRIG 309.0* 02/27/2013 1558   HDL 48.00 02/27/2013 1558   CHOLHDL 7 02/27/2013 1558   VLDL 61.8* 02/27/2013 1558    CBC    Component Value Date/Time   WBC 6.9 02/27/2013 1558   RBC 3.11* 02/27/2013 1558   HGB 10.5* 02/27/2013 1558   HCT 30.8* 02/27/2013 1558   PLT 188.0 02/27/2013 1558   MCV 98.9 02/27/2013 1558   MCH 33.5 02/20/2013 1600   MCHC 34.2 02/27/2013 1558   RDW 15.0* 02/27/2013 1558   LYMPHSABS 1.4 02/20/2013 1600   MONOABS 0.4 02/20/2013 1600   EOSABS 0.2 02/20/2013 1600   BASOSABS 0.0 02/20/2013 1600    Hgb A1C Lab Results  Component Value Date   HGBA1C 4.7 02/27/2013         Assessment & Plan:   Pelvic pain, recurrent:  Ultrasounds reviewed- benign cystic hyperplasia Urinalysis negative for infection Refferal placed for gyn

## 2013-04-17 NOTE — Telephone Encounter (Signed)
Mail order refill done

## 2013-04-17 NOTE — Patient Instructions (Signed)

## 2013-04-17 NOTE — Assessment & Plan Note (Signed)
Will recheck TSH today Continue current therapy at this time

## 2013-04-19 ENCOUNTER — Encounter: Payer: Self-pay | Admitting: *Deleted

## 2013-04-24 DIAGNOSIS — N8500 Endometrial hyperplasia, unspecified: Secondary | ICD-10-CM | POA: Diagnosis not present

## 2013-04-24 DIAGNOSIS — Z9189 Other specified personal risk factors, not elsewhere classified: Secondary | ICD-10-CM | POA: Diagnosis not present

## 2013-05-02 DIAGNOSIS — R9389 Abnormal findings on diagnostic imaging of other specified body structures: Secondary | ICD-10-CM | POA: Diagnosis not present

## 2013-05-03 LAB — HM PAP SMEAR: HM Pap smear: NORMAL

## 2013-07-19 ENCOUNTER — Ambulatory Visit (INDEPENDENT_AMBULATORY_CARE_PROVIDER_SITE_OTHER): Payer: Medicare Other | Admitting: Internal Medicine

## 2013-07-19 ENCOUNTER — Encounter: Payer: Self-pay | Admitting: Internal Medicine

## 2013-07-19 ENCOUNTER — Ambulatory Visit: Payer: Medicare Other | Admitting: Internal Medicine

## 2013-07-19 ENCOUNTER — Other Ambulatory Visit (INDEPENDENT_AMBULATORY_CARE_PROVIDER_SITE_OTHER): Payer: Medicare Other

## 2013-07-19 VITALS — BP 122/80 | HR 81 | Temp 98.4°F | Resp 16 | Ht 65.5 in | Wt 191.5 lb

## 2013-07-19 DIAGNOSIS — D510 Vitamin B12 deficiency anemia due to intrinsic factor deficiency: Secondary | ICD-10-CM | POA: Diagnosis not present

## 2013-07-19 DIAGNOSIS — E039 Hypothyroidism, unspecified: Secondary | ICD-10-CM

## 2013-07-19 DIAGNOSIS — E876 Hypokalemia: Secondary | ICD-10-CM

## 2013-07-19 DIAGNOSIS — R7401 Elevation of levels of liver transaminase levels: Secondary | ICD-10-CM

## 2013-07-19 DIAGNOSIS — E785 Hyperlipidemia, unspecified: Secondary | ICD-10-CM

## 2013-07-19 DIAGNOSIS — Z23 Encounter for immunization: Secondary | ICD-10-CM | POA: Diagnosis not present

## 2013-07-19 DIAGNOSIS — D573 Sickle-cell trait: Secondary | ICD-10-CM | POA: Insufficient documentation

## 2013-07-19 DIAGNOSIS — R7402 Elevation of levels of lactic acid dehydrogenase (LDH): Secondary | ICD-10-CM | POA: Diagnosis not present

## 2013-07-19 LAB — CBC WITH DIFFERENTIAL/PLATELET
Basophils Absolute: 0.1 10*3/uL (ref 0.0–0.1)
Basophils Relative: 1.1 % (ref 0.0–3.0)
Eosinophils Absolute: 0.4 10*3/uL (ref 0.0–0.7)
Eosinophils Relative: 5.2 % — ABNORMAL HIGH (ref 0.0–5.0)
HCT: 36.5 % (ref 36.0–46.0)
Hemoglobin: 12.4 g/dL (ref 12.0–15.0)
Lymphocytes Relative: 31.6 % (ref 12.0–46.0)
Lymphs Abs: 2.5 10*3/uL (ref 0.7–4.0)
MCHC: 33.9 g/dL (ref 30.0–36.0)
MCV: 84.7 fl (ref 78.0–100.0)
Monocytes Absolute: 0.4 10*3/uL (ref 0.1–1.0)
Monocytes Relative: 4.7 % (ref 3.0–12.0)
Neutro Abs: 4.5 10*3/uL (ref 1.4–7.7)
Neutrophils Relative %: 57.4 % (ref 43.0–77.0)
Platelets: 222 10*3/uL (ref 150.0–400.0)
RBC: 4.31 Mil/uL (ref 3.87–5.11)
RDW: 15.1 % — ABNORMAL HIGH (ref 11.5–14.6)
WBC: 7.8 10*3/uL (ref 4.5–10.5)

## 2013-07-19 LAB — COMPREHENSIVE METABOLIC PANEL
ALT: 21 U/L (ref 0–35)
AST: 17 U/L (ref 0–37)
Albumin: 4.1 g/dL (ref 3.5–5.2)
Alkaline Phosphatase: 72 U/L (ref 39–117)
BUN: 10 mg/dL (ref 6–23)
CO2: 29 mEq/L (ref 19–32)
Calcium: 9.6 mg/dL (ref 8.4–10.5)
Chloride: 106 mEq/L (ref 96–112)
Creatinine, Ser: 1.2 mg/dL (ref 0.4–1.2)
GFR: 57.39 mL/min — ABNORMAL LOW (ref >60.00)
Glucose, Bld: 98 mg/dL (ref 70–99)
Potassium: 4.1 mEq/L (ref 3.5–5.1)
Sodium: 141 mEq/L (ref 135–145)
Total Bilirubin: 0.7 mg/dL (ref 0.3–1.2)
Total Protein: 7.6 g/dL (ref 6.0–8.3)

## 2013-07-19 LAB — TSH: TSH: 10.32 u[IU]/mL — ABNORMAL HIGH (ref 0.35–5.50)

## 2013-07-19 LAB — LIPID PANEL
Cholesterol: 130 mg/dL (ref 0–200)
HDL: 53.2 mg/dL (ref >39.00)
LDL Cholesterol: 50 mg/dL (ref 0–99)
Total CHOL/HDL Ratio: 2
Triglycerides: 135 mg/dL (ref 0.0–149.0)
VLDL: 27 mg/dL (ref 0.0–40.0)

## 2013-07-19 LAB — IBC PANEL
Iron: 73 ug/dL (ref 42–145)
Saturation Ratios: 24.1 % (ref 20.0–50.0)
Transferrin: 216.4 mg/dL (ref 212.0–360.0)

## 2013-07-19 LAB — FERRITIN: Ferritin: 100.6 ng/mL (ref 10.0–291.0)

## 2013-07-19 NOTE — Assessment & Plan Note (Signed)
I will recheck her CBC and will look at her iron level as well

## 2013-07-19 NOTE — Assessment & Plan Note (Signed)
I will recheck her TSH and will adjust her dose if needed 

## 2013-07-19 NOTE — Assessment & Plan Note (Addendum)
I think she has fatty liver disease - I tried to order an U/S but the software blocked the order (not covered by her medicare plan) Will recheck her LFT's today and will screen her for Hep C as well

## 2013-07-19 NOTE — Patient Instructions (Signed)

## 2013-07-19 NOTE — Progress Notes (Signed)
Pre-visit discussion using our clinic review tool. No additional management support is needed unless otherwise documented below in the visit note.  

## 2013-07-19 NOTE — Progress Notes (Signed)
  Subjective:    Patient ID: Valerie Jackson, female    DOB: [birth year], 67 y.o.   MRN: [id number]  Thyroid Problem Presents for follow-up visit. Patient reports no anxiety, cold intolerance, constipation, depressed mood, diaphoresis, diarrhea, dry skin, fatigue, hair loss, heat intolerance, hoarse voice, leg swelling, nail problem, palpitations, tremors, visual change, weight gain or weight loss. The symptoms have been stable. Past treatments include levothyroxine.      Review of Systems  Constitutional: Negative.  Negative for fever, chills, weight loss, weight gain, diaphoresis, activity change, appetite change, fatigue and unexpected weight change.  HENT: Negative.  Negative for hoarse voice.   Eyes: Negative.   Respiratory: Negative.  Negative for cough, choking, chest tightness, shortness of breath, wheezing and stridor.   Cardiovascular: Negative.  Negative for chest pain, palpitations and leg swelling.  Gastrointestinal: Negative.  Negative for nausea, vomiting, abdominal pain, diarrhea, constipation, blood in stool and anal bleeding.  Endocrine: Negative.  Negative for cold intolerance and heat intolerance.  Genitourinary: Negative.   Musculoskeletal: Negative.  Negative for arthralgias, joint swelling, myalgias and neck stiffness.  Skin: Negative.   Allergic/Immunologic: Negative.   Neurological: Negative for dizziness, tremors, weakness, light-headedness and headaches.  Hematological: Negative.  Negative for adenopathy. Does not bruise/bleed easily.  Psychiatric/Behavioral: Negative.        Objective:   Physical Exam  Vitals reviewed. Constitutional: She is oriented to person, place, and time. She appears well-developed and well-nourished. No distress.  HENT:  Head: Normocephalic and atraumatic.  Mouth/Throat: Oropharynx is clear and moist. No oropharyngeal exudate.  Eyes: Conjunctivae are normal. Right eye exhibits no discharge. Left eye exhibits no discharge. No scleral  icterus.  Neck: Normal range of motion. Neck supple. No JVD present. No tracheal deviation present. No thyromegaly present.  Cardiovascular: Normal rate, regular rhythm, normal heart sounds and intact distal pulses.  Exam reveals no gallop and no friction rub.   No murmur heard. Pulmonary/Chest: Effort normal and breath sounds normal. No stridor. No respiratory distress. She has no wheezes. She has no rales. She exhibits no tenderness.  Abdominal: Soft. Bowel sounds are normal. She exhibits no distension and no mass. There is no tenderness. There is no rebound and no guarding.  Musculoskeletal: Normal range of motion. She exhibits no edema and no tenderness.  Lymphadenopathy:    She has no cervical adenopathy.  Neurological: She is oriented to person, place, and time.  Skin: Skin is warm and dry. No rash noted. She is not diaphoretic. No erythema. No pallor.  Psychiatric: She has a normal mood and affect. Her behavior is normal. Judgment and thought content normal.     Lab Results  Component Value Date   WBC 6.9 02/27/2013   HGB 10.5* 02/27/2013   HCT 30.8* 02/27/2013   PLT 188.0 02/27/2013   GLUCOSE 91 02/27/2013   CHOL 338* 02/27/2013   TRIG 309.0* 02/27/2013   HDL 48.00 02/27/2013   LDLDIRECT 226.0 02/27/2013   ALT 55* 02/27/2013   AST 69* 02/27/2013   NA 142 02/27/2013   K 3.1* 02/27/2013   CL 105 02/27/2013   CREATININE 1.9* 02/27/2013   BUN 9 02/27/2013   CO2 27 02/27/2013   TSH 0.92 04/17/2013   HGBA1C 4.7 02/27/2013       Assessment & Plan:

## 2013-07-19 NOTE — Assessment & Plan Note (Signed)
FLP today 

## 2013-07-19 NOTE — Assessment & Plan Note (Signed)
I will recheck her K+ level today 

## 2013-07-20 ENCOUNTER — Encounter: Payer: Self-pay | Admitting: Internal Medicine

## 2013-07-20 LAB — HEPATITIS C ANTIBODY: HCV Ab: NEGATIVE

## 2013-07-20 MED ORDER — LEVOTHYROXINE SODIUM 125 MCG PO TABS: 125.0000 ug | ORAL_TABLET | Freq: Every day | ORAL | Status: DC

## 2013-07-20 NOTE — Addendum Note (Signed)
Addended by: Janith Lima on: 07/20/2013 07:35 AM   Modules accepted: Orders, Medications

## 2013-09-11 ENCOUNTER — Emergency Department (HOSPITAL_COMMUNITY): Payer: Medicare Other

## 2013-09-11 ENCOUNTER — Encounter (HOSPITAL_COMMUNITY): Payer: Self-pay | Admitting: Emergency Medicine

## 2013-09-11 ENCOUNTER — Inpatient Hospital Stay (HOSPITAL_COMMUNITY)
Admission: EM | Admit: 2013-09-11 | Discharge: 2013-09-21 | DRG: 372 | Disposition: A | Discharge status: Home / Self Care | Payer: Medicare Other | Attending: Internal Medicine | Admitting: Internal Medicine

## 2013-09-11 DIAGNOSIS — K5792 Diverticulitis of intestine, part unspecified, without perforation or abscess without bleeding: Secondary | ICD-10-CM

## 2013-09-11 DIAGNOSIS — F17200 Nicotine dependence, unspecified, uncomplicated: Secondary | ICD-10-CM | POA: Diagnosis present

## 2013-09-11 DIAGNOSIS — R7401 Elevation of levels of liver transaminase levels: Secondary | ICD-10-CM

## 2013-09-11 DIAGNOSIS — E876 Hypokalemia: Secondary | ICD-10-CM | POA: Diagnosis present

## 2013-09-11 DIAGNOSIS — Z8249 Family history of ischemic heart disease and other diseases of the circulatory system: Secondary | ICD-10-CM | POA: Diagnosis not present

## 2013-09-11 DIAGNOSIS — K630 Abscess of intestine: Principal | ICD-10-CM | POA: Diagnosis present

## 2013-09-11 DIAGNOSIS — R109 Unspecified abdominal pain: Secondary | ICD-10-CM | POA: Diagnosis not present

## 2013-09-11 DIAGNOSIS — Z803 Family history of malignant neoplasm of breast: Secondary | ICD-10-CM

## 2013-09-11 DIAGNOSIS — R197 Diarrhea, unspecified: Secondary | ICD-10-CM | POA: Diagnosis present

## 2013-09-11 DIAGNOSIS — R112 Nausea with vomiting, unspecified: Secondary | ICD-10-CM | POA: Diagnosis present

## 2013-09-11 DIAGNOSIS — I10 Essential (primary) hypertension: Secondary | ICD-10-CM | POA: Diagnosis not present

## 2013-09-11 DIAGNOSIS — J189 Pneumonia, unspecified organism: Secondary | ICD-10-CM | POA: Diagnosis not present

## 2013-09-11 DIAGNOSIS — K5720 Diverticulitis of large intestine with perforation and abscess without bleeding: Secondary | ICD-10-CM | POA: Diagnosis present

## 2013-09-11 DIAGNOSIS — D72829 Elevated white blood cell count, unspecified: Secondary | ICD-10-CM | POA: Diagnosis present

## 2013-09-11 DIAGNOSIS — R1013 Epigastric pain: Secondary | ICD-10-CM | POA: Diagnosis not present

## 2013-09-11 DIAGNOSIS — R141 Gas pain: Secondary | ICD-10-CM | POA: Diagnosis present

## 2013-09-11 DIAGNOSIS — F329 Major depressive disorder, single episode, unspecified: Secondary | ICD-10-CM | POA: Diagnosis present

## 2013-09-11 DIAGNOSIS — Z808 Family history of malignant neoplasm of other organs or systems: Secondary | ICD-10-CM | POA: Diagnosis not present

## 2013-09-11 DIAGNOSIS — K5730 Diverticulosis of large intestine without perforation or abscess without bleeding: Secondary | ICD-10-CM | POA: Diagnosis not present

## 2013-09-11 DIAGNOSIS — D573 Sickle-cell trait: Secondary | ICD-10-CM | POA: Diagnosis present

## 2013-09-11 DIAGNOSIS — R1032 Left lower quadrant pain: Secondary | ICD-10-CM | POA: Diagnosis present

## 2013-09-11 DIAGNOSIS — Z6831 Body mass index (BMI) 31.0-31.9, adult: Secondary | ICD-10-CM

## 2013-09-11 DIAGNOSIS — Z8701 Personal history of pneumonia (recurrent): Secondary | ICD-10-CM

## 2013-09-11 DIAGNOSIS — R142 Eructation: Secondary | ICD-10-CM | POA: Diagnosis present

## 2013-09-11 DIAGNOSIS — K219 Gastro-esophageal reflux disease without esophagitis: Secondary | ICD-10-CM | POA: Diagnosis present

## 2013-09-11 DIAGNOSIS — K651 Peritoneal abscess: Secondary | ICD-10-CM | POA: Diagnosis not present

## 2013-09-11 DIAGNOSIS — E7800 Pure hypercholesterolemia, unspecified: Secondary | ICD-10-CM | POA: Diagnosis present

## 2013-09-11 DIAGNOSIS — E039 Hypothyroidism, unspecified: Secondary | ICD-10-CM | POA: Diagnosis present

## 2013-09-11 DIAGNOSIS — E785 Hyperlipidemia, unspecified: Secondary | ICD-10-CM | POA: Diagnosis not present

## 2013-09-11 DIAGNOSIS — E669 Obesity, unspecified: Secondary | ICD-10-CM

## 2013-09-11 DIAGNOSIS — Z823 Family history of stroke: Secondary | ICD-10-CM

## 2013-09-11 DIAGNOSIS — K5732 Diverticulitis of large intestine without perforation or abscess without bleeding: Secondary | ICD-10-CM | POA: Diagnosis present

## 2013-09-11 DIAGNOSIS — R933 Abnormal findings on diagnostic imaging of other parts of digestive tract: Secondary | ICD-10-CM | POA: Diagnosis not present

## 2013-09-11 DIAGNOSIS — F3289 Other specified depressive episodes: Secondary | ICD-10-CM | POA: Diagnosis present

## 2013-09-11 LAB — CBC WITH DIFFERENTIAL/PLATELET
Basophils Absolute: 0 10*3/uL (ref 0.0–0.1)
Basophils Relative: 0 % (ref 0–1)
Eosinophils Absolute: 0.1 10*3/uL (ref 0.0–0.7)
Eosinophils Relative: 0 % (ref 0–5)
HCT: 37.2 % (ref 36.0–46.0)
Hemoglobin: 12.8 g/dL (ref 12.0–15.0)
Lymphocytes Relative: 8 % — ABNORMAL LOW (ref 12–46)
Lymphs Abs: 1.2 10*3/uL (ref 0.7–4.0)
MCH: 29.2 pg (ref 26.0–34.0)
MCHC: 34.4 g/dL (ref 30.0–36.0)
MCV: 84.7 fL (ref 78.0–100.0)
Monocytes Absolute: 0.8 10*3/uL (ref 0.1–1.0)
Monocytes Relative: 5 % (ref 3–12)
Neutro Abs: 13.4 10*3/uL — ABNORMAL HIGH (ref 1.7–7.7)
Neutrophils Relative %: 86 % — ABNORMAL HIGH (ref 43–77)
Platelets: 213 10*3/uL (ref 150–400)
RBC: 4.39 MIL/uL (ref 3.87–5.11)
RDW: 13.5 % (ref 11.5–15.5)
WBC: 15.5 10*3/uL — ABNORMAL HIGH (ref 4.0–10.5)

## 2013-09-11 LAB — COMPREHENSIVE METABOLIC PANEL
ALT: 58 U/L — ABNORMAL HIGH (ref 0–35)
AST: 48 U/L — ABNORMAL HIGH (ref 0–37)
Albumin: 3.5 g/dL (ref 3.5–5.2)
Alkaline Phosphatase: 98 U/L (ref 39–117)
BUN: 12 mg/dL (ref 6–23)
CO2: 22 mEq/L (ref 19–32)
Calcium: 9.9 mg/dL (ref 8.4–10.5)
Chloride: 98 mEq/L (ref 96–112)
Creatinine, Ser: 1.1 mg/dL (ref 0.50–1.10)
GFR calc Af Amer: 58 mL/min — ABNORMAL LOW (ref >90)
GFR calc non Af Amer: 50 mL/min — ABNORMAL LOW (ref >90)
Glucose, Bld: 130 mg/dL — ABNORMAL HIGH (ref 70–99)
Potassium: 3.3 mEq/L — ABNORMAL LOW (ref 3.5–5.1)
Sodium: 134 mEq/L — ABNORMAL LOW (ref 135–145)
Total Bilirubin: 1.2 mg/dL (ref 0.3–1.2)
Total Protein: 7.6 g/dL (ref 6.0–8.3)

## 2013-09-11 MED ORDER — IOHEXOL 300 MG/ML  SOLN
50.0000 mL | Freq: Once | INTRAMUSCULAR | Status: AC | PRN
  Administered 2013-09-11: 50 mL via ORAL

## 2013-09-11 MED ORDER — SODIUM CHLORIDE 0.9 % IV BOLUS (SEPSIS)
1000.0000 mL | Freq: Once | INTRAVENOUS | Status: AC
  Administered 2013-09-11: 1000 mL via INTRAVENOUS

## 2013-09-11 MED ORDER — HYDROMORPHONE HCL PF 1 MG/ML IJ SOLN
1.0000 mg | Freq: Once | INTRAMUSCULAR | Status: AC
  Administered 2013-09-11: 1 mg via INTRAVENOUS
  Filled 2013-09-11: qty 1

## 2013-09-11 MED ORDER — ONDANSETRON HCL 4 MG/2ML IJ SOLN
4.0000 mg | Freq: Once | INTRAMUSCULAR | Status: AC
  Administered 2013-09-11: 4 mg via INTRAVENOUS
  Filled 2013-09-11: qty 2

## 2013-09-11 MED ORDER — IOHEXOL 300 MG/ML  SOLN
100.0000 mL | Freq: Once | INTRAMUSCULAR | Status: AC | PRN
  Administered 2013-09-11: 100 mL via INTRAVENOUS

## 2013-09-11 NOTE — ED Provider Notes (Signed)
CSN: [id number]     Arrival date & time 09/11/13  2141 History   First MD Initiated Contact with Patient 09/11/13 2210     Chief Complaint  Patient presents with  . Abdominal Pain   (Consider location/radiation/quality/duration/timing/severity/associated sxs/prior Treatment) Patient is a 67 y.o. female presenting with abdominal pain. The history is provided by the patient. No language interpreter was used.  Abdominal Pain Pain severity:  Moderate Context: not sick contacts, not suspicious food intake and not trauma   Associated symptoms: vomiting   Associated symptoms: no chills, no diarrhea, no dysuria, no fever, no nausea and no shortness of breath    Pt is a 67 year old female who presents with lower abdominal pain. She reports that she has been having abdominal pain and episodic vomiting that started 2 days ago. The vomiting has resolved but she continues to have abdominal pain. She denies fever, chills or diarrhea. She reports that she feels pain and pressure in her lower abdomen when she tries to urinate or have a bowel movement.   Past Medical History  Diagnosis Date  . Hypertension   . Hypothyroid   . Depression   . Hypercholesteremia   . Sickle cell trait   . Embolus   . GERD (gastroesophageal reflux disease)   . History of blood transfusion    Past Surgical History  Procedure Laterality Date  . Cholecystectomy  2002  . Tonsillectomy  1952   Family History  Problem Relation Age of Onset  . Hypertension Mother   . Aneurysm Mother   . Cancer Mother     Uterine  . Thyroid cancer Mother   . Cancer Other   . Thyroid cancer Sister   . Arthritis Sister   . Hyperlipidemia Sister   . Hypertension Sister   . Stroke Sister   . Kidney disease Sister   . Hyperlipidemia Brother   . Thyroid cancer Daughter   . Breast cancer Maternal Grandmother   . Hypertension Maternal Grandmother    History  Substance Use Topics  . Smoking status: Current Every Day Smoker -- 0.25  packs/day for 25 years    Types: Cigarettes  . Smokeless tobacco: Never Used  . Alcohol Use: No   OB History   Grav Para Term Preterm Abortions TAB SAB Ect Mult Living                 Review of Systems  Constitutional: Negative for fever and chills.  Respiratory: Negative for shortness of breath.   Gastrointestinal: Positive for vomiting and abdominal pain. Negative for nausea and diarrhea.  Genitourinary: Negative for dysuria and pelvic pain.  All other systems reviewed and are negative.    Allergies  Estrogens  Home Medications   Current Outpatient Rx  Name  Route  Sig  Dispense  Refill  . levothyroxine (SYNTHROID, LEVOTHROID) 125 MCG tablet   Oral   Take 1 tablet (125 mcg total) by mouth daily.   90 tablet   1    BP 112/71  Pulse 81  Temp(Src) 98.3 F (36.8 C) (Oral)  Resp 20  Ht 5' 5"  (1.651 m)  Wt 185 lb (83.915 kg)  BMI 30.79 kg/m2  SpO2 94% Physical Exam  Nursing note and vitals reviewed. Constitutional: She is oriented to person, place, and time. She appears well-developed and well-nourished. No distress.  HENT:  Head: Normocephalic and atraumatic.  Mouth/Throat: Oropharynx is clear and moist.  Eyes: Conjunctivae and EOM are normal.  Neck: Normal range of  motion. Neck supple. No JVD present. No tracheal deviation present. No thyromegaly present.  Cardiovascular: Normal rate, regular rhythm, normal heart sounds and intact distal pulses.   Pulmonary/Chest: Effort normal and breath sounds normal. No respiratory distress. She has no wheezes.  Abdominal: Soft. Normal appearance and bowel sounds are normal. There is no hepatosplenomegaly. There is tenderness in the right lower quadrant and left lower quadrant. There is rebound and guarding. There is no rigidity, no CVA tenderness, no tenderness at McBurney's point and negative Murphy's sign.  Musculoskeletal: Normal range of motion.  Lymphadenopathy:    She has no cervical adenopathy.  Neurological: She is  alert and oriented to person, place, and time.  Skin: Skin is warm and dry.  Psychiatric: She has a normal mood and affect. Her behavior is normal. Judgment and thought content normal.    ED Course  Procedures (including critical care time) Labs Review Labs Reviewed - No data to display Imaging Review No results found.  EKG Interpretation   None       MDM   1. Acute diverticulitis    Acute diverticulitis with small abscess, approx 1.5cm. Will admit by hospitalist for IV antibiotics.    Elisha Headland, NP 09/18/13 2257

## 2013-09-11 NOTE — ED Notes (Signed)
Pt reports ab pain that started 2 days ago. Pain located epigastric region down to entire abdomen area with tenderness. Pt denies diarrhea but reports vomiting 2 days ago.

## 2013-09-12 ENCOUNTER — Encounter (HOSPITAL_COMMUNITY): Payer: Self-pay | Admitting: Internal Medicine

## 2013-09-12 DIAGNOSIS — K5720 Diverticulitis of large intestine with perforation and abscess without bleeding: Secondary | ICD-10-CM | POA: Diagnosis present

## 2013-09-12 DIAGNOSIS — I10 Essential (primary) hypertension: Secondary | ICD-10-CM

## 2013-09-12 DIAGNOSIS — K5732 Diverticulitis of large intestine without perforation or abscess without bleeding: Secondary | ICD-10-CM

## 2013-09-12 DIAGNOSIS — E876 Hypokalemia: Secondary | ICD-10-CM

## 2013-09-12 LAB — CBC
HCT: 34 % — ABNORMAL LOW (ref 36.0–46.0)
Hemoglobin: 11.9 g/dL — ABNORMAL LOW (ref 12.0–15.0)
MCH: 29.8 pg (ref 26.0–34.0)
MCHC: 35 g/dL (ref 30.0–36.0)
MCV: 85 fL (ref 78.0–100.0)
Platelets: 203 10*3/uL (ref 150–400)
RBC: 4 MIL/uL (ref 3.87–5.11)
RDW: 13.5 % (ref 11.5–15.5)
WBC: 16.5 10*3/uL — ABNORMAL HIGH (ref 4.0–10.5)

## 2013-09-12 LAB — COMPREHENSIVE METABOLIC PANEL
ALT: 78 U/L — ABNORMAL HIGH (ref 0–35)
AST: 61 U/L — ABNORMAL HIGH (ref 0–37)
Albumin: 3 g/dL — ABNORMAL LOW (ref 3.5–5.2)
Alkaline Phosphatase: 92 U/L (ref 39–117)
BUN: 12 mg/dL (ref 6–23)
CO2: 24 mEq/L (ref 19–32)
Calcium: 9.6 mg/dL (ref 8.4–10.5)
Chloride: 103 mEq/L (ref 96–112)
Creatinine, Ser: 1.02 mg/dL (ref 0.50–1.10)
GFR calc Af Amer: 64 mL/min — ABNORMAL LOW (ref >90)
GFR calc non Af Amer: 55 mL/min — ABNORMAL LOW (ref >90)
Glucose, Bld: 146 mg/dL — ABNORMAL HIGH (ref 70–99)
Potassium: 4.2 mEq/L (ref 3.7–5.3)
Sodium: 139 mEq/L (ref 137–147)
Total Bilirubin: 1.2 mg/dL (ref 0.3–1.2)
Total Protein: 6.9 g/dL (ref 6.0–8.3)

## 2013-09-12 LAB — PHOSPHORUS: Phosphorus: 3.5 mg/dL (ref 2.3–4.6)

## 2013-09-12 LAB — URINALYSIS, ROUTINE W REFLEX MICROSCOPIC
Bilirubin Urine: NEGATIVE
Glucose, UA: NEGATIVE mg/dL
Ketones, ur: NEGATIVE mg/dL
Leukocytes, UA: NEGATIVE
Nitrite: NEGATIVE
Protein, ur: 100 mg/dL — AB
Specific Gravity, Urine: 1.041 — ABNORMAL HIGH (ref 1.005–1.030)
Urobilinogen, UA: 1 mg/dL (ref 0.0–1.0)
pH: 6.5 (ref 5.0–8.0)

## 2013-09-12 LAB — LIPASE, BLOOD: Lipase: 24 U/L (ref 11–59)

## 2013-09-12 LAB — URINE MICROSCOPIC-ADD ON

## 2013-09-12 LAB — MAGNESIUM: Magnesium: 2.1 mg/dL (ref 1.5–2.5)

## 2013-09-12 LAB — TSH: TSH: 0.111 u[IU]/mL — ABNORMAL LOW (ref 0.350–4.500)

## 2013-09-12 MED ORDER — HYDROMORPHONE HCL PF 1 MG/ML IJ SOLN
1.0000 mg | Freq: Once | INTRAMUSCULAR | Status: AC
  Administered 2013-09-12: 1 mg via INTRAVENOUS
  Filled 2013-09-12: qty 1

## 2013-09-12 MED ORDER — HYDROCODONE-ACETAMINOPHEN 5-325 MG PO TABS
1.0000 | ORAL_TABLET | ORAL | Status: DC | PRN
  Administered 2013-09-12 (×2): 2 via ORAL
  Filled 2013-09-12 (×3): qty 2

## 2013-09-12 MED ORDER — POTASSIUM CHLORIDE 10 MEQ/100ML IV SOLN
10.0000 meq | INTRAVENOUS | Status: AC
  Administered 2013-09-12 (×4): 10 meq via INTRAVENOUS
  Filled 2013-09-12 (×4): qty 100

## 2013-09-12 MED ORDER — ACETAMINOPHEN 325 MG PO TABS: 650.0000 mg | ORAL_TABLET | Freq: Four times a day (QID) | ORAL | Status: DC | PRN

## 2013-09-12 MED ORDER — LEVOTHYROXINE SODIUM 125 MCG PO TABS
125.0000 ug | ORAL_TABLET | Freq: Every day | ORAL | Status: DC
  Administered 2013-09-12 – 2013-09-21 (×10): 125 ug via ORAL
  Filled 2013-09-12 (×12): qty 1

## 2013-09-12 MED ORDER — ACETAMINOPHEN 650 MG RE SUPP: 650.0000 mg | Freq: Four times a day (QID) | RECTAL | Status: DC | PRN

## 2013-09-12 MED ORDER — ONDANSETRON HCL 4 MG/2ML IJ SOLN
4.0000 mg | Freq: Four times a day (QID) | INTRAMUSCULAR | Status: DC | PRN
  Administered 2013-09-12 – 2013-09-13 (×2): 4 mg via INTRAVENOUS
  Filled 2013-09-12 (×2): qty 2

## 2013-09-12 MED ORDER — MORPHINE SULFATE 2 MG/ML IJ SOLN
2.0000 mg | INTRAMUSCULAR | Status: DC | PRN
  Administered 2013-09-12: 2 mg via INTRAVENOUS
  Filled 2013-09-12: qty 1

## 2013-09-12 MED ORDER — MORPHINE SULFATE 2 MG/ML IJ SOLN
2.0000 mg | INTRAMUSCULAR | Status: DC | PRN
  Administered 2013-09-12 – 2013-09-13 (×6): 4 mg via INTRAVENOUS
  Administered 2013-09-14 (×2): 2 mg via INTRAVENOUS
  Administered 2013-09-14: 4 mg via INTRAVENOUS
  Administered 2013-09-15: 2 mg via INTRAVENOUS
  Administered 2013-09-15 (×2): 4 mg via INTRAVENOUS
  Administered 2013-09-15: 2 mg via INTRAVENOUS
  Administered 2013-09-15: 4 mg via INTRAVENOUS
  Filled 2013-09-12 (×5): qty 2
  Filled 2013-09-12: qty 1
  Filled 2013-09-12 (×3): qty 2
  Filled 2013-09-12: qty 1
  Filled 2013-09-12 (×4): qty 2

## 2013-09-12 MED ORDER — ENOXAPARIN SODIUM 40 MG/0.4ML ~~LOC~~ SOLN
40.0000 mg | SUBCUTANEOUS | Status: DC
  Filled 2013-09-12 (×9): qty 0.4

## 2013-09-12 MED ORDER — SODIUM CHLORIDE 0.9 % IV SOLN
INTRAVENOUS | Status: AC
  Administered 2013-09-12: 04:00:00 via INTRAVENOUS

## 2013-09-12 MED ORDER — PIPERACILLIN-TAZOBACTAM 3.375 G IVPB
3.3750 g | Freq: Three times a day (TID) | INTRAVENOUS | Status: DC
  Administered 2013-09-12 – 2013-09-21 (×28): 3.375 g via INTRAVENOUS
  Filled 2013-09-12 (×30): qty 50

## 2013-09-12 MED ORDER — VITAMINS A & D EX OINT
TOPICAL_OINTMENT | CUTANEOUS | Status: AC
  Administered 2013-09-12: 04:00:00
  Filled 2013-09-12: qty 5

## 2013-09-12 MED ORDER — ONDANSETRON HCL 4 MG PO TABS: 4.0000 mg | ORAL_TABLET | Freq: Four times a day (QID) | ORAL | Status: DC | PRN

## 2013-09-12 MED ORDER — POLYETHYLENE GLYCOL 3350 17 GM/SCOOP PO POWD
1.0000 | Freq: Once | ORAL | Status: AC
  Administered 2013-09-12: 1 via ORAL
  Filled 2013-09-12: qty 255

## 2013-09-12 MED ORDER — HYDRALAZINE HCL 20 MG/ML IJ SOLN: 10.0000 mg | INTRAMUSCULAR | Status: DC | PRN

## 2013-09-12 NOTE — ED Notes (Signed)
Patient states that she is still unable to void.

## 2013-09-12 NOTE — Progress Notes (Signed)
Patient refusing Lovenox, statinig she will walk three times in the hall today instead

## 2013-09-12 NOTE — Progress Notes (Signed)
Pt got to 1316 around 3 a.m.[name] Pt was able use bedpan had an out put of 300cc.

## 2013-09-12 NOTE — H&P (Signed)
PCP:  [name], MD    Chief Complaint:  Abdominal pain  HPI: Valerie Jackson is a 67 y.o. female   has a past medical history of Hypertension; Hypothyroid; Depression; Hypercholesteremia; Sickle cell trait; Embolus; GERD (gastroesophageal reflux disease); and History of blood transfusion.   Presented with  2 day history of abdominal pain associated with nausea and vomiting. Denies fever chills or diarrhea. Pain mainly in the center of abdomen as well as right lower quadrant. Patient presented to emerge department where a CT scan was done showing diverticulitis to 1.5 cm abscess. ER physician spoke to Gen. surgery with this point recommends medicine admission with the IV antibiotics. Hospitalist was called for an admission.    Review of Systems:    Pertinent positives include: abdominal pain, nausea, vomiting,   Constitutional:  No weight loss, night sweats, Fevers, chills, fatigue, weight loss  HEENT:  No headaches, Difficulty swallowing,Tooth/dental problems,Sore throat,  No sneezing, itching, ear ache, nasal congestion, post nasal drip,  Cardio-vascular:  No chest pain, Orthopnea, PND, anasarca, dizziness, palpitations.no Bilateral lower extremity swelling  GI:  No heartburn, indigestion, diarrhea, change in bowel habits, loss of appetite, melena, blood in stool, hematemesis Resp:  no shortness of breath at rest. No dyspnea on exertion, No excess mucus, no productive cough, No non-productive cough, No coughing up of blood.No change in color of mucus.No wheezing. Skin:  no rash or lesions. No jaundice GU:  no dysuria, change in color of urine, no urgency or frequency. No straining to urinate.  No flank pain.  Musculoskeletal:  No joint pain or no joint swelling. No decreased range of motion. No back pain.  Psych:  No change in mood or affect. No depression or anxiety. No memory loss.  Neuro: no localizing neurological complaints, no tingling, no weakness, no double vision, no  gait abnormality, no slurred speech, no confusion  Otherwise ROS are negative except for above, 10 systems were reviewed  Past Medical History: Past Medical History  Diagnosis Date  . Hypertension   . Hypothyroid   . Depression   . Hypercholesteremia   . Sickle cell trait   . Embolus   . GERD (gastroesophageal reflux disease)   . History of blood transfusion    Past Surgical History  Procedure Laterality Date  . Cholecystectomy  2002  . Tonsillectomy  1952     Medications: Prior to Admission medications   Medication Sig Start Date End Date Taking? Authorizing Provider  levothyroxine (SYNTHROID, LEVOTHROID) 125 MCG tablet Take 1 tablet (125 mcg total) by mouth daily. 07/20/13  Yes Janith Lima, MD    Allergies:   Allergies  Allergen Reactions  . Estrogens     Blood clot in leg    Social History:  Ambulatory independently  Lives at home   reports that she has been smoking Cigarettes.  She has a 6.25 pack-year smoking history. She has never used smokeless tobacco. She reports that she does not drink alcohol or use illicit drugs.   Family History: family history includes Aneurysm in her mother; Arthritis in her sister; Breast cancer in her maternal grandmother; Cancer in her mother and other; Hyperlipidemia in her brother and sister; Hypertension in her maternal grandmother, mother, and sister; Kidney disease in her sister; Stroke in her sister; Thyroid cancer in her daughter, mother, and sister.    Physical Exam: Patient Vitals for the past 24 hrs:  BP Temp Temp src Pulse Resp SpO2 Height Weight  09/12/13 0303 152/68 mmHg 97.9 F (  36.6 C) Oral 63 18 97 % 5' 5"  (1.651 m) 85.231 kg (187 lb 14.4 oz)  09/12/13 0133 135/98 mmHg 97.9 F (36.6 C) Oral 73 17 96 % - -  09/11/13 2150 112/71 mmHg 98.3 F (36.8 C) Oral 81 20 94 % 5' 5"  (1.651 m) 83.915 kg (185 lb)    1. General:  Actively vomiting during the interview 2. Psychological: Alert and  Oriented 3. Head/ENT:    Dry Mucous Membranes                          Head Non traumatic, neck supple                          Normal Dentition 4. SKIN:  decreased Skin turgor,  Skin clean Dry and intact no rash 5. Heart: Regular rate and rhythm no Murmur, Rub or gallop 6. Lungs: Clear to auscultation bilaterally, no wheezes or crackles   7. Abdomen: Soft, right lower quadrant tenderness, Non distended 8. Lower extremities: no clubbing, cyanosis, or edema 9. Neurologically Grossly intact, moving all 4 extremities equally 10. MSK: Normal range of motion  body mass index is 31.27 kg/(m^2).   Labs on Admission:   Recent Labs  09/11/13 2240  NA 134*  K 3.3*  CL 98  CO2 22  GLUCOSE 130*  BUN 12  CREATININE 1.10  CALCIUM 9.9    Recent Labs  09/11/13 2240  AST 48*  ALT 58*  ALKPHOS 98  BILITOT 1.2  PROT 7.6  ALBUMIN 3.5    Recent Labs  09/11/13 2240  LIPASE 24    Recent Labs  09/11/13 2240  WBC 15.5*  NEUTROABS 13.4*  HGB 12.8  HCT 37.2  MCV 84.7  PLT 213   No results found for this basename: CKTOTAL, CKMB, CKMBINDEX, TROPONINI,  in the last 72 hours No results found for this basename: TSH, T4TOTAL, FREET3, T3FREE, THYROIDAB,  in the last 72 hours No results found for this basename: VITAMINB12, FOLATE, FERRITIN, TIBC, IRON, RETICCTPCT,  in the last 72 hours Lab Results  Component Value Date   HGBA1C 4.7 02/27/2013    Estimated Creatinine Clearance: 52.8 ml/min (by C-G formula based on Cr of 1.1). ABG No results found for this basename: phart, pco2, po2, hco3, tco2, acidbasedef, o2sat     No results found for this basename: DDIMER     Other results:   Cultures: No results found for this basename: sdes, specrequest, cult, reptstatus       Radiological Exams on Admission: Ct Abdomen Pelvis W Contrast  09/12/2013   CLINICAL DATA:  Abdominal pain starting 2 days ago. Pain is in the epigastric region along the entire abdomen with tenderness. Vomiting 2 days ago. White cell  count 15.5.  EXAM: CT ABDOMEN AND PELVIS WITH CONTRAST  TECHNIQUE: Multidetector CT imaging of the abdomen and pelvis was performed using the standard protocol following bolus administration of intravenous contrast.  CONTRAST:  49m OMNIPAQUE IOHEXOL 300 MG/ML SOLN, 1023mOMNIPAQUE IOHEXOL 300 MG/ML SOLN  COMPARISON:  None.  FINDINGS: Mild dependent changes in the lung bases. Small peripheral collapse. Surgical clip adjacent to the right cardiophrenic angle. Small esophageal hiatal hernia.  Surgical absence of the gallbladder. There is mild intra and extrahepatic bile duct dilatation. This may be physiologic in the setting of previous cholecystectomy. No obstructing stone or mass is visualized. Non radiopaque common duct stone is not excluded. Consider further  evaluation with liver function studies and if positive, consider MRCP in the elective setting. Diffuse fatty infiltration of the liver without focal liver lesion. Spleen size is normal. The pancreas, adrenal glands, abdominal aorta, inferior vena cava, and retroperitoneal lymph nodes are unremarkable. Multiple small subcentimeter cysts in the kidneys. No solid mass or hydronephrosis demonstrated. The stomach, small bowel, and colon are not abnormally distended. No free air or free fluid in the abdomen. Small umbilical hernia containing fat.  Pelvis: Uterus and ovaries are not enlarged. Diverticulosis of the sigmoid colon with infiltration around the colon and with small fluid collections inferior to the colon and in the perirectal space. Changes are consistent with acute diverticulitis. Early abscess measuring 1.5 cm diameter is suggested. The appendix is not visualized. Bladder wall is not thickened. No significant pelvic lymphadenopathy. The degenerative changes in the lumbar spine. No destructive bone lesions appreciated.  IMPRESSION: Diverticulosis with acute diverticulitis in the sigmoid colon. Pericolonic infiltration with small abscess measuring 1.5 cm  diameter.   Electronically Signed   By: Lucienne Capers M.D.   On: 09/12/2013 00:17    Chart has been reviewed  Assessment/Plan  A 67 year old female history of diet-controlled hypertension here with evidence of diverticulitis and small abscess  Present on Admission:  . Diverticulitis large intestine - admit, bowel rest, IV fluids, Zosyn IV if no improvement would need to reimage  . Hypertension - hydralazine when necessary  . Hypokalemia -replace  . Pericolonic abscess due to diverticulitis - IV Zosyn if increasing in size will need drainage    Prophylaxis:  Lovenox, Protonix  CODE STATUS: Full code  Other plan as per orders.  I have spent a total of 55 min on this admission  Jazline Cumbee 09/12/2013, 3:40 AM

## 2013-09-12 NOTE — Progress Notes (Signed)
[facility] PROGRESS NOTE  Valerie Jackson ESP:[id number] DOB: 1946/02/06 DOA: 09/11/2013 PCP: [name], MD  Presented with 2 day history of abdominal pain associated with nausea and vomiting. Denies fever chills or diarrhea. Pain mainly in the center of abdomen as well as right lower quadrant.  CT scan was done showing diverticulitis to 1.5 cm abscess. ER physician spoke to Gen. surgery with this point recommends medicine admission with the IV antibiotics. Hospitalist was called for an admission.     Assessment/Plan:  Diverticulitis with 1.5 cm abscess 1st episode of diverticulitis. Patient on IV Zosyn Normally has 3 BM a day.  No BM for past 3 days. Start on gentle slow miralax bowel prep (just to sip) Percocet for pain, morphine for breakthru.  If no improvement would re-image and consult surgery. Patient understands she will need a colonoscopy after this has healed.  Hypertension Not on medications at home  Hypokalemia Resolved with IV supplementation.    DVT Prophylaxis:  Patient refuses lovenox.  Will ambulate.  Code Status: full Family Communication: patient A&O Disposition Plan: inpatient.   Consultants:    Procedures:    Antibiotics:  Zosyn  HPI/Subjective: Patient with cramping pain at rest and significant pain with movement.  Objective: Filed Vitals:   09/12/13 0133 09/12/13 0303 09/12/13 0345 09/12/13 0527  BP: 135/98 152/68  122/82  Pulse: 73 63  76  Temp: 97.9 F (36.6 C) 97.9 F (36.6 C)  97.8 F (36.6 C)  TempSrc: Oral Oral  Oral  Resp: 17 18  18   Height:  5' 5"  (1.651 m)    Weight:  85.231 kg (187 lb 14.4 oz)    SpO2: 96% 97% 94% 97%    Intake/Output Summary (Last 24 hours) at 09/12/13 1113 Last data filed at 09/12/13 0853  Gross per 24 hour  Intake      0 ml  Output   1300 ml  Net  -1300 ml   Filed Weights   09/11/13 2150 09/12/13 0303  Weight: 83.915 kg (185 lb) 85.231 kg (187 lb 14.4 oz)    Exam: General: Well  developed, well nourished, NAD, appears stated age  20:  PERR, EOMI, Anicteic Sclera, MMM. No pharyngeal erythema or exudates  Neck: Supple, no JVD, no masses  Cardiovascular: RRR, S1 S2 auscultated, no rubs, murmurs or gallops.   Respiratory: Clear to auscultation bilaterally with equal chest rise  Abdomen: Soft, tender to palpation in RLQ, nondistended, + decreased.bowel sounds  Extremities: warm dry without cyanosis clubbing or edema.  Neuro: AAOx3, cranial nerves grossly intact. Strength 5/5 in upper and lower extremities  Skin: Without rashes exudates or nodules.   Psych: Normal affect and demeanor with intact judgement and insight       Data Reviewed: Basic Metabolic Panel:  Recent Labs Lab 09/11/13 2240 09/12/13 0608  NA 134* 139  K 3.3* 4.2  CL 98 103  CO2 22 24  GLUCOSE 130* 146*  BUN 12 12  CREATININE 1.10 1.02  CALCIUM 9.9 9.6  MG  --  2.1  PHOS  --  3.5   Liver Function Tests:  Recent Labs Lab 09/11/13 2240 09/12/13 0608  AST 48* 61*  ALT 58* 78*  ALKPHOS 98 92  BILITOT 1.2 1.2  PROT 7.6 6.9  ALBUMIN 3.5 3.0*    Recent Labs Lab 09/11/13 2240  LIPASE 24   No results found for this basename: AMMONIA,  in the last 168 hours  Recent Labs Lab 09/11/13 2240 09/12/13 0608  WBC  15.5* 16.5*  NEUTROABS 13.4*  --   HGB 12.8 11.9*  HCT 37.2 34.0*  MCV 84.7 85.0  PLT 213 203   BNP (last 3 results)  Recent Labs  02/20/13 1600  PROBNP 9.6     Studies: Ct Abdomen Pelvis W Contrast  09/12/2013   CLINICAL DATA:  Abdominal pain starting 2 days ago. Pain is in the epigastric region along the entire abdomen with tenderness. Vomiting 2 days ago. White cell count 15.5.  EXAM: CT ABDOMEN AND PELVIS WITH CONTRAST  TECHNIQUE: Multidetector CT imaging of the abdomen and pelvis was performed using the standard protocol following bolus administration of intravenous contrast.  CONTRAST:  96m OMNIPAQUE IOHEXOL 300 MG/ML SOLN, 1021mOMNIPAQUE IOHEXOL 300  MG/ML SOLN  COMPARISON:  None.  FINDINGS: Mild dependent changes in the lung bases. Small peripheral collapse. Surgical clip adjacent to the right cardiophrenic angle. Small esophageal hiatal hernia.  Surgical absence of the gallbladder. There is mild intra and extrahepatic bile duct dilatation. This may be physiologic in the setting of previous cholecystectomy. No obstructing stone or mass is visualized. Non radiopaque common duct stone is not excluded. Consider further evaluation with liver function studies and if positive, consider MRCP in the elective setting. Diffuse fatty infiltration of the liver without focal liver lesion. Spleen size is normal. The pancreas, adrenal glands, abdominal aorta, inferior vena cava, and retroperitoneal lymph nodes are unremarkable. Multiple small subcentimeter cysts in the kidneys. No solid mass or hydronephrosis demonstrated. The stomach, small bowel, and colon are not abnormally distended. No free air or free fluid in the abdomen. Small umbilical hernia containing fat.  Pelvis: Uterus and ovaries are not enlarged. Diverticulosis of the sigmoid colon with infiltration around the colon and with small fluid collections inferior to the colon and in the perirectal space. Changes are consistent with acute diverticulitis. Early abscess measuring 1.5 cm diameter is suggested. The appendix is not visualized. Bladder wall is not thickened. No significant pelvic lymphadenopathy. The degenerative changes in the lumbar spine. No destructive bone lesions appreciated.  IMPRESSION: Diverticulosis with acute diverticulitis in the sigmoid colon. Pericolonic infiltration with small abscess measuring 1.5 cm diameter.   Electronically Signed   By: WiLucienne Capers.D.   On: 09/12/2013 00:17    Scheduled Meds: . enoxaparin (LOVENOX) injection  40 mg Subcutaneous Q24H  . levothyroxine  125 mcg Oral QAC breakfast  . piperacillin-tazobactam (ZOSYN)  IV  3.375 g Intravenous Q8H  . polyethylene  glycol powder  1 Container Oral Once   Continuous Infusions: . sodium chloride 125 mL/hr at 09/12/13 0358    Active Problems:   Hypokalemia   Hypertension   Pericolonic abscess due to diverticulitis   Diverticulitis large intestine    YoKaren Kitchens[facility] Pager [phone] 7PM-7AM, please contact night-coverage at [email or web address], password [email or web address]2/30/2014, 11:13 AM  LOS: 1 day

## 2013-09-12 NOTE — Care Management Note (Signed)
   CARE MANAGEMENT NOTE 09/12/2013  Patient:  Valerie Jackson, Valerie Jackson   Account Number:  [id number]  Date Initiated:  09/12/2013  Documentation initiated by:  Jonmichael Beadnell  Subjective/Objective Assessment:   67 yo female admitte with Diverticulitis large intestine. PCP:  [name], MD     Action/Plan:   Home when stable   Anticipated DC Date:     Anticipated DC Plan:        [place]  CM consult      Choice offered to / List presented to:  NA   DME arranged  NA      DME agency  NA     [place] arranged  NA      [place] agency  NA   Status of service:  In process, will continue to follow Medicare Important Message given?   (If response is "NO", the following Medicare IM given date fields will be blank) Date Medicare IM given:   Date Additional Medicare IM given:    Discharge Disposition:    Per UR Regulation:  Reviewed for med. necessity/level of care/duration of stay  If discussed at [place] of Stay Meetings, dates discussed:    Comments:  09/12/13 [place] Chart reviewed for utilization of services. pt is a retired Therapist, sports. No needs identified at this time.

## 2013-09-13 DIAGNOSIS — D573 Sickle-cell trait: Secondary | ICD-10-CM

## 2013-09-13 DIAGNOSIS — E039 Hypothyroidism, unspecified: Secondary | ICD-10-CM

## 2013-09-13 LAB — BASIC METABOLIC PANEL
BUN: 9 mg/dL (ref 6–23)
CO2: 22 mEq/L (ref 19–32)
Calcium: 8.9 mg/dL (ref 8.4–10.5)
Chloride: 103 mEq/L (ref 96–112)
Creatinine, Ser: 1.08 mg/dL (ref 0.50–1.10)
GFR calc Af Amer: 60 mL/min — ABNORMAL LOW (ref >90)
GFR calc non Af Amer: 52 mL/min — ABNORMAL LOW (ref >90)
Glucose, Bld: 108 mg/dL — ABNORMAL HIGH (ref 70–99)
Potassium: 3.9 mEq/L (ref 3.7–5.3)
Sodium: 137 mEq/L (ref 137–147)

## 2013-09-13 LAB — CBC
HCT: 32.1 % — ABNORMAL LOW (ref 36.0–46.0)
Hemoglobin: 10.8 g/dL — ABNORMAL LOW (ref 12.0–15.0)
MCH: 29.3 pg (ref 26.0–34.0)
MCHC: 33.6 g/dL (ref 30.0–36.0)
MCV: 87 fL (ref 78.0–100.0)
Platelets: 194 10*3/uL (ref 150–400)
RBC: 3.69 MIL/uL — ABNORMAL LOW (ref 3.87–5.11)
RDW: 13.6 % (ref 11.5–15.5)
WBC: 10.6 10*3/uL — ABNORMAL HIGH (ref 4.0–10.5)

## 2013-09-13 NOTE — Progress Notes (Signed)
[facility] PROGRESS NOTE  Valerie Jackson VFM:[id number] DOB: [birth year] DOA: 09/11/2013 PCP: [name], MD  Assessment/Plan: Diverticulitis with 1.5 cm abscess  1st episode of diverticulitis.  -Continue  IV Zosyn , wbc's continued to decrease -Normally has 3 BM a day. No BM for past 3 days.  -Continue morphine for pain control   -If no improvement would re-image and consult surgery (percutaneous drainage of abscess).  -Consult GI in the a.m.: Patient understands she will need a colonoscopy after this has healed .  Hypertension  Not on medications at home   Hypokalemia  Resolved with IV supplementation.  Hypothyroidism -Continue home medication   Code Status: Full  Family Communication: No family available for discussion of plan Disposition Plan: Resolution of abscess/diverticulitis   Consultants:   Procedures: CT abdomen pelvis with contrast 09/11/2013 Diverticulosis with acute diverticulitis in the sigmoid colon.  Pericolonic infiltration with small abscess measuring 1.5 cm  diameter.  Antibiotics: Zosyn 12/30>>   HPI/Subjective: 67 yo BF PMHx sickle cell trait, HTN, hypothyroidism, hypokalemia. Presented with 2 day history of abdominal pain associated with nausea and vomiting. Denies fever chills or diarrhea. Pain mainly in the center of abdomen as well as right lower quadrant. CT scan was done showing diverticulitis to 1.5 cm abscess. ER physician spoke to Gen. surgery with this point recommends medicine admission with the IV antibiotics. 09/13/2013 patient states still having mid abdominal pain especially when she moves/changes position positive flatulence negative BM.      Objective: Filed Vitals:   09/12/13 1400 09/12/13 2253 09/13/13 0733 09/13/13 1444  BP: 125/71 115/73 122/70 119/68  Pulse: 77 77 73 71  Temp: 98 F (36.7 C) 98.3 F (36.8 C) 98.6 F (37 C) 98.7 F (37.1 C)  TempSrc: Oral Oral Oral Oral  Resp: 18 20 20 18   Height:       Weight:      SpO2: 96% 98% 96% 96%    Intake/Output Summary (Last 24 hours) at 09/13/13 1916 Last data filed at 09/13/13 1818  Gross per 24 hour  Intake   2460 ml  Output   2250 ml  Net    210 ml   Filed Weights   09/11/13 2150 09/12/13 0303  Weight: 83.915 kg (185 lb) 85.231 kg (187 lb 14.4 oz)    Exam:   General: A./O. x4, moderate abdominal pain   Cardiovascular: RRR, (-) M/R/G, DP/PT pulse (+1) bilat  Respiratory: CTA Bilat  Abdomen: Diffuse Abd tenderness RLQ> LUQ> LLQ, (-) Bowel sounds  Musculoskeletal: (-) pedal edema   Data Reviewed: Basic Metabolic Panel:  Recent Labs Lab 09/11/13 2240 09/12/13 0608 09/13/13 0440  NA 134* 139 137  K 3.3* 4.2 3.9  CL 98 103 103  CO2 22 24 22   GLUCOSE 130* 146* 108*  BUN 12 12 9   CREATININE 1.10 1.02 1.08  CALCIUM 9.9 9.6 8.9  MG  --  2.1  --   PHOS  --  3.5  --    Liver Function Tests:  Recent Labs Lab 09/11/13 2240 09/12/13 0608  AST 48* 61*  ALT 58* 78*  ALKPHOS 98 92  BILITOT 1.2 1.2  PROT 7.6 6.9  ALBUMIN 3.5 3.0*    Recent Labs Lab 09/11/13 2240  LIPASE 24   No results found for this basename: AMMONIA,  in the last 168 hours CBC:  Recent Labs Lab 09/11/13 2240 09/12/13 0608 09/13/13 0440  WBC 15.5* 16.5* 10.6*  NEUTROABS 13.4*  --   --   HGB  12.8 11.9* 10.8*  HCT 37.2 34.0* 32.1*  MCV 84.7 85.0 87.0  PLT 213 203 194   Cardiac Enzymes: No results found for this basename: CKTOTAL, CKMB, CKMBINDEX, TROPONINI,  in the last 168 hours BNP (last 3 results)  Recent Labs  02/20/13 1600  PROBNP 9.6   CBG: No results found for this basename: GLUCAP,  in the last 168 hours  No results found for this or any previous visit (from the past 240 hour(s)).   Studies: Ct Abdomen Pelvis W Contrast  09/12/2013   CLINICAL DATA:  Abdominal pain starting 2 days ago. Pain is in the epigastric region along the entire abdomen with tenderness. Vomiting 2 days ago. White cell count 15.5.  EXAM: CT  ABDOMEN AND PELVIS WITH CONTRAST  TECHNIQUE: Multidetector CT imaging of the abdomen and pelvis was performed using the standard protocol following bolus administration of intravenous contrast.  CONTRAST:  9m OMNIPAQUE IOHEXOL 300 MG/ML SOLN, 1022mOMNIPAQUE IOHEXOL 300 MG/ML SOLN  COMPARISON:  None.  FINDINGS: Mild dependent changes in the lung bases. Small peripheral collapse. Surgical clip adjacent to the right cardiophrenic angle. Small esophageal hiatal hernia.  Surgical absence of the gallbladder. There is mild intra and extrahepatic bile duct dilatation. This may be physiologic in the setting of previous cholecystectomy. No obstructing stone or mass is visualized. Non radiopaque common duct stone is not excluded. Consider further evaluation with liver function studies and if positive, consider MRCP in the elective setting. Diffuse fatty infiltration of the liver without focal liver lesion. Spleen size is normal. The pancreas, adrenal glands, abdominal aorta, inferior vena cava, and retroperitoneal lymph nodes are unremarkable. Multiple small subcentimeter cysts in the kidneys. No solid mass or hydronephrosis demonstrated. The stomach, small bowel, and colon are not abnormally distended. No free air or free fluid in the abdomen. Small umbilical hernia containing fat.  Pelvis: Uterus and ovaries are not enlarged. Diverticulosis of the sigmoid colon with infiltration around the colon and with small fluid collections inferior to the colon and in the perirectal space. Changes are consistent with acute diverticulitis. Early abscess measuring 1.5 cm diameter is suggested. The appendix is not visualized. Bladder wall is not thickened. No significant pelvic lymphadenopathy. The degenerative changes in the lumbar spine. No destructive bone lesions appreciated.  IMPRESSION: Diverticulosis with acute diverticulitis in the sigmoid colon. Pericolonic infiltration with small abscess measuring 1.5 cm diameter.    Electronically Signed   By: WiLucienne Capers.D.   On: 09/12/2013 00:17    Scheduled Meds: . enoxaparin (LOVENOX) injection  40 mg Subcutaneous Q24H  . levothyroxine  125 mcg Oral QAC breakfast  . piperacillin-tazobactam (ZOSYN)  IV  3.375 g Intravenous Q8H   Continuous Infusions:   Active Problems:   Hypokalemia   Hypertension   Pericolonic abscess due to diverticulitis   Diverticulitis large intestine    Time spent: 50 minutes   Ellajane Stong, J  [facility] Pager [phone] 7PM-7AM, please contact night-coverage at [email or web address], password [email or web address]2/31/2014, 7:16 PM  LOS: 2 days

## 2013-09-14 ENCOUNTER — Encounter (HOSPITAL_COMMUNITY): Payer: Self-pay | Admitting: Gastroenterology

## 2013-09-14 DIAGNOSIS — K630 Abscess of intestine: Secondary | ICD-10-CM

## 2013-09-14 DIAGNOSIS — K5792 Diverticulitis of intestine, part unspecified, without perforation or abscess without bleeding: Secondary | ICD-10-CM

## 2013-09-14 HISTORY — DX: Diverticulitis of intestine, part unspecified, without perforation or abscess without bleeding: K57.92

## 2013-09-14 HISTORY — DX: Abscess of intestine: K63.0

## 2013-09-14 LAB — COMPREHENSIVE METABOLIC PANEL
ALT: 123 U/L — ABNORMAL HIGH (ref 0–35)
AST: 75 U/L — ABNORMAL HIGH (ref 0–37)
Albumin: 2.6 g/dL — ABNORMAL LOW (ref 3.5–5.2)
Alkaline Phosphatase: 161 U/L — ABNORMAL HIGH (ref 39–117)
BUN: 7 mg/dL (ref 6–23)
CO2: 24 mEq/L (ref 19–32)
Calcium: 9.2 mg/dL (ref 8.4–10.5)
Chloride: 104 mEq/L (ref 96–112)
Creatinine, Ser: 1.14 mg/dL — ABNORMAL HIGH (ref 0.50–1.10)
GFR calc Af Amer: 56 mL/min — ABNORMAL LOW (ref >90)
GFR calc non Af Amer: 48 mL/min — ABNORMAL LOW (ref >90)
Glucose, Bld: 100 mg/dL — ABNORMAL HIGH (ref 70–99)
Potassium: 3.7 mEq/L (ref 3.7–5.3)
Sodium: 141 mEq/L (ref 137–147)
Total Bilirubin: 1.2 mg/dL (ref 0.3–1.2)
Total Protein: 6.4 g/dL (ref 6.0–8.3)

## 2013-09-14 LAB — CBC WITH DIFFERENTIAL/PLATELET
Basophils Absolute: 0 10*3/uL (ref 0.0–0.1)
Basophils Relative: 0 % (ref 0–1)
Eosinophils Absolute: 0.2 10*3/uL (ref 0.0–0.7)
Eosinophils Relative: 3 % (ref 0–5)
HCT: 31.3 % — ABNORMAL LOW (ref 36.0–46.0)
Hemoglobin: 10.7 g/dL — ABNORMAL LOW (ref 12.0–15.0)
Lymphocytes Relative: 18 % (ref 12–46)
Lymphs Abs: 1.6 10*3/uL (ref 0.7–4.0)
MCH: 29.4 pg (ref 26.0–34.0)
MCHC: 34.2 g/dL (ref 30.0–36.0)
MCV: 86 fL (ref 78.0–100.0)
Monocytes Absolute: 0.6 10*3/uL (ref 0.1–1.0)
Monocytes Relative: 8 % (ref 3–12)
Neutro Abs: 6 10*3/uL (ref 1.7–7.7)
Neutrophils Relative %: 71 % (ref 43–77)
Platelets: 205 10*3/uL (ref 150–400)
RBC: 3.64 MIL/uL — ABNORMAL LOW (ref 3.87–5.11)
RDW: 13.3 % (ref 11.5–15.5)
WBC: 8.4 10*3/uL (ref 4.0–10.5)

## 2013-09-14 LAB — MAGNESIUM: Magnesium: 2 mg/dL (ref 1.5–2.5)

## 2013-09-14 MED ORDER — SODIUM CHLORIDE 0.9 % IV SOLN
INTRAVENOUS | Status: DC
  Administered 2013-09-15 (×2): 100 mL/h via INTRAVENOUS
  Administered 2013-09-15 – 2013-09-19 (×8): via INTRAVENOUS
  Administered 2013-09-20 (×2): 100 mL/h via INTRAVENOUS
  Administered 2013-09-20 – 2013-09-21 (×2): via INTRAVENOUS

## 2013-09-14 MED ORDER — POLYETHYLENE GLYCOL 3350 17 G PO PACK
17.0000 g | PACK | Freq: Every day | ORAL | Status: DC
  Administered 2013-09-14 – 2013-09-17 (×4): 17 g via ORAL
  Filled 2013-09-14 (×5): qty 1

## 2013-09-14 NOTE — Progress Notes (Signed)
[facility] PROGRESS NOTE  Valerie Jackson TAV:[id number] DOB: [birth year] DOA: 09/11/2013 PCP: [name], MD  Assessment/Plan: Diverticulitis with 1.5 cm abscess  1st episode of diverticulitis.  -Continue  IV Zosyn , wbc's continued to decrease -Positive flatulence today however still no bowel movement start MiraLax daily   -Continue morphine for pain control   - Consulted GI Dr. Lizbeth Bark [facility]); agreed with continued anabiotic for 2-4 weeks with reimaging of abdomen at the two-week mark. If no resolution of abscess IR to perform (percutaneous drainage of abscess)  -Patient with flatulence diet advance to clear liquids -Start normal saline at 187m/hr to ensure patient does not become dehydrated  Hypertension  Stable continue to monitor   Hypokalemia  Resolved with IV supplementation.  Hypothyroidism -Continue home medication  DVT prophylaxis -Patient has refused Lovenox and SCDs counseled on the sequela of not following this treatment.   Code Status: Full  Family Communication: No family available for discussion of plan Disposition Plan: Resolution of abscess/diverticulitis   Consultants:   Procedures: CT abdomen pelvis with contrast 09/11/2013 Diverticulosis with acute diverticulitis in the sigmoid colon.  Pericolonic infiltration with small abscess measuring 1.5 cm  diameter.  Antibiotics: Zosyn 12/30>>   HPI/Subjective: 68yo BF PMHx sickle cell trait, HTN, hypothyroidism, hypokalemia. Presented with 2 day history of abdominal pain associated with nausea and vomiting. Denies fever chills or diarrhea. Pain mainly in the center of abdomen as well as right lower quadrant. CT scan was done showing diverticulitis to 1.5 cm abscess. ER physician spoke to Gen. surgery with this point recommends medicine admission with the IV antibiotics. 09/13/2013 patient states still having mid abdominal pain especially when she moves/changes position positive flatulence  negative BM. 09/14/2013 states abdomen is feeling improved, positive bowel sounds, negative nausea/vomiting with clear liquids, positive flatulence. States still no BM. States [facility].Magod [facility] evaluated her today     Objective: Filed Vitals:   09/13/13 1444 09/13/13 2329 09/14/13 0538 09/14/13 1402  BP: 119/68 124/74 134/70 152/88  Pulse: 71 79 72 78  Temp: 98.7 F (37.1 C) 98.5 F (36.9 C) 98.6 F (37 C) 97.6 F (36.4 C)  TempSrc: Oral Oral Oral Oral  Resp: 18 16 16 18   Height:      Weight:      SpO2: 96% 97% 98% 100%    Intake/Output Summary (Last 24 hours) at 09/14/13 1818 Last data filed at 09/14/13 1725  Gross per 24 hour  Intake    480 ml  Output   1850 ml  Net  -1370 ml   Filed Weights   09/11/13 2150 09/12/13 0303  Weight: 83.915 kg (185 lb) 85.231 kg (187 lb 14.4 oz)    Exam:   General: A./O. x4, mild - moderate abdominal pain   Cardiovascular: RRR, (-) M/R/G, DP/PT pulse (+1) bilat  Respiratory: CTA Bilat  Abdomen: Diffuse Abd tenderness RLQ> LUQ> LLQ, (+) Bowel sounds; overall improvement from yesterday's presentation  Musculoskeletal: (-) pedal edema   Data Reviewed: Basic Metabolic Panel:  Recent Labs Lab 09/11/13 2240 09/12/13 0608 09/13/13 0440 09/14/13 0741  NA 134* 139 137 141  K 3.3* 4.2 3.9 3.7  CL 98 103 103 104  CO2 22 24 22 24   GLUCOSE 130* 146* 108* 100*  BUN 12 12 9 7   CREATININE 1.10 1.02 1.08 1.14*  CALCIUM 9.9 9.6 8.9 9.2  MG  --  2.1  --  2.0  PHOS  --  3.5  --   --  Liver Function Tests:  Recent Labs Lab 09/11/13 2240 09/12/13 0608 09/14/13 0741  AST 48* 61* 75*  ALT 58* 78* 123*  ALKPHOS 98 92 161*  BILITOT 1.2 1.2 1.2  PROT 7.6 6.9 6.4  ALBUMIN 3.5 3.0* 2.6*    Recent Labs Lab 09/11/13 2240  LIPASE 24   No results found for this basename: AMMONIA,  in the last 168 hours CBC:  Recent Labs Lab 09/11/13 2240 09/12/13 0608 09/13/13 0440 09/14/13 0741  WBC 15.5* 16.5* 10.6* 8.4  NEUTROABS  13.4*  --   --  6.0  HGB 12.8 11.9* 10.8* 10.7*  HCT 37.2 34.0* 32.1* 31.3*  MCV 84.7 85.0 87.0 86.0  PLT 213 203 194 205   Cardiac Enzymes: No results found for this basename: CKTOTAL, CKMB, CKMBINDEX, TROPONINI,  in the last 168 hours BNP (last 3 results)  Recent Labs  02/20/13 1600  PROBNP 9.6   CBG: No results found for this basename: GLUCAP,  in the last 168 hours  No results found for this or any previous visit (from the past 240 hour(s)).   Studies: No results found.  Scheduled Meds: . enoxaparin (LOVENOX) injection  40 mg Subcutaneous Q24H  . levothyroxine  125 mcg Oral QAC breakfast  . piperacillin-tazobactam (ZOSYN)  IV  3.375 g Intravenous Q8H   Continuous Infusions:   Active Problems:   Hypokalemia   Hypertension   Unspecified hypothyroidism   Sickle cell trait   Pericolonic abscess due to diverticulitis   Diverticulitis large intestine    Time spent: 40 minutes   Kennth Vanbenschoten, J  [facility] Pager [phone]. If 7PM-7AM, please contact night-coverage at [email or web address], password [facility] 09/14/2013, 6:18 PM  LOS: 3 days

## 2013-09-14 NOTE — Consult Note (Signed)
Reason for Consult: Diverticulitis Referring Physician: Hospital team  Valerie Jackson is an 68 y.o. female.  HPI: Patient seen at the request of the hospital team for diverticulitis and small abscess who is slowly getting better on IV antibiotic but still has a moderate amount of abdominal pain but is passing gas and tolerating clear liquid and this is her first attack however diverticular problems run in the family and 1 relative does have Crohn's disease but no colon cancer or obvious colon polyps and she did have an uneventful colonoscopy in [place] about 10 years ago and she does have a long history of IBS and lately has been having bowel movements regularly after eating without any blood and has actually gained weight 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 Hypertension   . Hypothyroid   . Depression   . Hypercholesteremia   . Sickle cell trait   . Embolus   . GERD (gastroesophageal reflux disease)   . History of blood transfusion     Past Surgical History  Procedure Laterality Date  . Cholecystectomy  2002  . Tonsillectomy  1952    Family History  Problem Relation Age of Onset  . Hypertension Mother   . Aneurysm Mother   . Cancer Mother     Uterine  . Thyroid cancer Mother   . Cancer Other   . Thyroid cancer Sister   . Arthritis Sister   . Hyperlipidemia Sister   . Hypertension Sister   . Stroke Sister   . Kidney disease Sister   . Hyperlipidemia Brother   . Thyroid cancer Daughter   . Breast cancer Maternal Grandmother   . Hypertension Maternal Grandmother     Social History:  reports that she has been smoking Cigarettes.  She has a 6.25 pack-year smoking history. She has never used smokeless tobacco. She reports that she does not drink alcohol or use illicit drugs.  Allergies:  Allergies   Allergen Reactions  . Estrogens     Blood clot in leg    Medications: I have reviewed the patient's current medications.  Results for orders placed during the hospital encounter of 09/11/13 (from the past 48 hour(s))  CBC     Status: Abnormal   Collection Time    09/13/13  4:40 AM      Result Value Range   WBC 10.6 (*) 4.0 - 10.5 K/uL   RBC 3.69 (*) 3.87 - 5.11 MIL/uL   Hemoglobin 10.8 (*) 12.0 - 15.0 g/dL   HCT 32.1 (*) 36.0 - 46.0 %   MCV 87.0  78.0 - 100.0 fL   MCH 29.3  26.0 - 34.0 pg   MCHC 33.6  30.0 - 36.0 g/dL   RDW 13.6  11.5 - 15.5 %   Platelets 194  150 - 400 K/uL  BASIC METABOLIC PANEL     Status: Abnormal   Collection Time    09/13/13  4:40 AM      Result Value Range   Sodium 137  137 - 147 mEq/L   Comment: Please note change in reference range.   Potassium 3.9  3.7 - 5.3 mEq/L   Comment: Please note change in reference range.   Chloride 103  96 - 112 mEq/L   CO2 22  19 - 32 mEq/L  Glucose, Bld 108 (*) 70 - 99 mg/dL   BUN 9  6 - 23 mg/dL   Creatinine, Ser 1.08  0.50 - 1.10 mg/dL   Calcium 8.9  8.4 - 10.5 mg/dL   GFR calc non Af Amer 52 (*) >90 mL/min   GFR calc Af Amer 60 (*) >90 mL/min   Comment: (NOTE)     The eGFR has been calculated using the CKD EPI equation.     This calculation has not been validated in all clinical situations.     eGFR's persistently <90 mL/min signify possible Chronic Kidney     Disease.  CBC WITH DIFFERENTIAL     Status: Abnormal   Collection Time    09/14/13  7:41 AM      Result Value Range   WBC 8.4  4.0 - 10.5 K/uL   RBC 3.64 (*) 3.87 - 5.11 MIL/uL   Hemoglobin 10.7 (*) 12.0 - 15.0 g/dL   HCT 31.3 (*) 36.0 - 46.0 %   MCV 86.0  78.0 - 100.0 fL   MCH 29.4  26.0 - 34.0 pg   MCHC 34.2  30.0 - 36.0 g/dL   RDW 13.3  11.5 - 15.5 %   Platelets 205  150 - 400 K/uL   Neutrophils Relative % 71  43 - 77 %   Neutro Abs 6.0  1.7 - 7.7 K/uL   Lymphocytes Relative 18  12 - 46 %   Lymphs Abs 1.6  0.7 - 4.0 K/uL   Monocytes  Relative 8  3 - 12 %   Monocytes Absolute 0.6  0.1 - 1.0 K/uL   Eosinophils Relative 3  0 - 5 %   Eosinophils Absolute 0.2  0.0 - 0.7 K/uL   Basophils Relative 0  0 - 1 %   Basophils Absolute 0.0  0.0 - 0.1 K/uL  COMPREHENSIVE METABOLIC PANEL     Status: Abnormal   Collection Time    09/14/13  7:41 AM      Result Value Range   Sodium 141  137 - 147 mEq/L   Comment: Please note change in reference range.   Potassium 3.7  3.7 - 5.3 mEq/L   Comment: Please note change in reference range.   Chloride 104  96 - 112 mEq/L   CO2 24  19 - 32 mEq/L   Glucose, Bld 100 (*) 70 - 99 mg/dL   BUN 7  6 - 23 mg/dL   Creatinine, Ser 1.14 (*) 0.50 - 1.10 mg/dL   Calcium 9.2  8.4 - 10.5 mg/dL   Total Protein 6.4  6.0 - 8.3 g/dL   Albumin 2.6 (*) 3.5 - 5.2 g/dL   AST 75 (*) 0 - 37 U/L   ALT 123 (*) 0 - 35 U/L   Alkaline Phosphatase 161 (*) 39 - 117 U/L   Total Bilirubin 1.2  0.3 - 1.2 mg/dL   GFR calc non Af Amer 48 (*) >90 mL/min   GFR calc Af Amer 56 (*) >90 mL/min   Comment: (NOTE)     The eGFR has been calculated using the CKD EPI equation.     This calculation has not been validated in all clinical situations.     eGFR's persistently <90 mL/min signify possible Chronic Kidney     Disease.  MAGNESIUM     Status: None   Collection Time    09/14/13  7:41 AM      Result Value Range   Magnesium 2.0  1.5 -  2.5 mg/dL    No results found.  ROS negative except above Blood pressure 152/88, pulse 78, temperature 97.6 F (36.4 C), temperature source Oral, resp. rate 18, height 5' 5"  (1.651 m), weight 85.231 kg (187 lb 14.4 oz), SpO2 100.00%. Physical Exam vital signs stable afebrile no acute distress abdomen is soft occasional bowel sounds right lower quadrant tenderness is the worse but she also has some left lower quadrant as well but no guarding or rebound and her white count is decreased and CT report reviewed and no pedal edema good peripheral pulses  Assessment/Plan: Diverticulitis with small  abscess Plan: Would not advance diet until pain is significantly better and would continue antibiotics for 2 weeks and then repeat CT scan as long as patient does well and I will determine how much longer of antibiotics she needs or if we should proceed with drainage probably percutaneous versus surgery and she will need another colonoscopy at some point when stable and I'm happy to see her back in the office the week or 2 after discharge to set up all of the above and myself or one of my partners will check on her tomorrow  [facility] E 09/14/2013, 2:25 PM

## 2013-09-15 DIAGNOSIS — E785 Hyperlipidemia, unspecified: Secondary | ICD-10-CM

## 2013-09-15 LAB — CBC WITH DIFFERENTIAL/PLATELET
Basophils Absolute: 0 10*3/uL (ref 0.0–0.1)
Basophils Relative: 0 % (ref 0–1)
Eosinophils Absolute: 0.3 10*3/uL (ref 0.0–0.7)
Eosinophils Relative: 4 % (ref 0–5)
HCT: 31 % — ABNORMAL LOW (ref 36.0–46.0)
Hemoglobin: 10.6 g/dL — ABNORMAL LOW (ref 12.0–15.0)
Lymphocytes Relative: 19 % (ref 12–46)
Lymphs Abs: 1.3 10*3/uL (ref 0.7–4.0)
MCH: 29 pg (ref 26.0–34.0)
MCHC: 34.2 g/dL (ref 30.0–36.0)
MCV: 84.7 fL (ref 78.0–100.0)
Monocytes Absolute: 0.6 10*3/uL (ref 0.1–1.0)
Monocytes Relative: 9 % (ref 3–12)
Neutro Abs: 4.9 10*3/uL (ref 1.7–7.7)
Neutrophils Relative %: 69 % (ref 43–77)
Platelets: 215 10*3/uL (ref 150–400)
RBC: 3.66 MIL/uL — ABNORMAL LOW (ref 3.87–5.11)
RDW: 13.1 % (ref 11.5–15.5)
WBC: 7.2 10*3/uL (ref 4.0–10.5)

## 2013-09-15 LAB — COMPREHENSIVE METABOLIC PANEL
ALT: 99 U/L — ABNORMAL HIGH (ref 0–35)
AST: 47 U/L — ABNORMAL HIGH (ref 0–37)
Albumin: 2.6 g/dL — ABNORMAL LOW (ref 3.5–5.2)
Alkaline Phosphatase: 158 U/L — ABNORMAL HIGH (ref 39–117)
BUN: 5 mg/dL — ABNORMAL LOW (ref 6–23)
CO2: 23 mEq/L (ref 19–32)
Calcium: 9 mg/dL (ref 8.4–10.5)
Chloride: 104 mEq/L (ref 96–112)
Creatinine, Ser: 1.1 mg/dL (ref 0.50–1.10)
GFR calc Af Amer: 58 mL/min — ABNORMAL LOW (ref >90)
GFR calc non Af Amer: 50 mL/min — ABNORMAL LOW (ref >90)
Glucose, Bld: 102 mg/dL — ABNORMAL HIGH (ref 70–99)
Potassium: 3.2 mEq/L — ABNORMAL LOW (ref 3.7–5.3)
Sodium: 139 mEq/L (ref 137–147)
Total Bilirubin: 1 mg/dL (ref 0.3–1.2)
Total Protein: 6.5 g/dL (ref 6.0–8.3)

## 2013-09-15 LAB — MAGNESIUM: Magnesium: 2 mg/dL (ref 1.5–2.5)

## 2013-09-15 MED ORDER — MORPHINE SULFATE 2 MG/ML IJ SOLN
2.0000 mg | INTRAMUSCULAR | Status: DC | PRN
  Administered 2013-09-16: 4 mg via INTRAVENOUS
  Administered 2013-09-16 – 2013-09-17 (×3): 2 mg via INTRAVENOUS
  Administered 2013-09-17: 4 mg via INTRAVENOUS
  Administered 2013-09-17 – 2013-09-18 (×4): 2 mg via INTRAVENOUS
  Filled 2013-09-15 (×3): qty 1
  Filled 2013-09-15: qty 2
  Filled 2013-09-15 (×6): qty 1

## 2013-09-15 MED ORDER — MORPHINE SULFATE 4 MG/ML IJ SOLN
INTRAMUSCULAR | Status: AC
  Administered 2013-09-15: 4 mg
  Filled 2013-09-15: qty 1

## 2013-09-15 NOTE — Progress Notes (Signed)
[facility] PROGRESS NOTE  Valerie Jackson HGD:[id number] DOB: [birth year] DOA: 09/11/2013 PCP: [name], MD  Assessment/Plan: Diverticulitis with 1.5 cm abscess  1st episode of diverticulitis.  -Continue  IV Zosyn , wbc's continued to decrease -Positive flatulence today however still no bowel movement start MiraLax daily   -Continue morphine for pain control   - Consulted GI Dr. Lizbeth [facility]); agreed with continued anabiotic for 2-4 weeks with reimaging of abdomen at the two-week mark. If no resolution of abscess IR to perform (percutaneous drainage of abscess)  -Secondary to increasing pain with clear liquid diet will DC clear liquids and return to n.p.o. with ice chips  -Continue normal saline at 15m/hr to ensure patient does not become dehydrated  Hypertension  Stable continue to monitor   Hypokalemia  Resolved with IV supplementation. Continue to monitor  Hypothyroidism -Continue home medication  DVT prophylaxis -Patient has refused Lovenox and SCDs counseled on the sequela of not following this treatment.   Code Status: Full  Family Communication: No family available for discussion of plan Disposition Plan: Resolution of abscess/diverticulitis   [facility]   Procedures: CT abdomen pelvis with contrast 09/11/2013 Diverticulosis with acute diverticulitis in the sigmoid colon.  Pericolonic infiltration with small abscess measuring 1.5 cm  diameter.  Antibiotics: Zosyn 12/30>>   HPI/Subjective: 68yo BF PMHx sickle cell trait, HTN, hypothyroidism, hypokalemia. Presented with 2 day history of abdominal pain associated with nausea and vomiting. Denies fever chills or diarrhea. Pain mainly in the center of abdomen as well as right lower quadrant. CT scan was done showing diverticulitis to 1.5 cm abscess. ER physician spoke to Gen. surgery with this point recommends medicine admission with the IV antibiotics. 09/13/2013 patient  states still having mid abdominal pain especially when she moves/changes position positive flatulence negative BM. 09/14/2013 states abdomen is feeling improved, positive bowel sounds, negative nausea/vomiting with clear liquids, positive flatulence. States still no BM. [facility]. 09/15/2013 patient complained of increased abdominal pain which is now situated more in the left lower quadrant left upper quadrant of the abdomen. Pain in the epigastric area has decreased.    Objective: Filed Vitals:   09/14/13 2055 09/15/13 0512 09/15/13 1400 09/15/13 2251  BP: 144/92 133/60 107/65 140/65  Pulse: 73 67 64 58  Temp:  99.2 F (37.3 C) 98 F (36.7 C) 98.4 F (36.9 C)  TempSrc:  Oral Oral Oral  Resp:  18 18 14   Height:      Weight:      SpO2:  97% 98% 97%    Intake/Output Summary (Last 24 hours) at 09/15/13 2335 Last data filed at 09/15/13 1725  Gross per 24 hour  Intake 1276.67 ml  Output   2800 ml  Net -1523.33 ml   Filed Weights   09/11/13 2150 09/12/13 0303  Weight: 83.915 kg (185 lb) 85.231 kg (187 lb 14.4 oz)    Exam:   General: A./O. x4, mild - moderate abdominal pain now centered more in the LLQ/LUQ of abdomen   Cardiovascular: RRR, (-) M/R/G, DP/PT pulse (+1) bilat  Respiratory: CTA Bilat  Abdomen: Diffuse Abd tenderness LLQ>LUQ>RLQ, (+) Bowel sounds; setback from yesterday's presentation   Musculoskeletal: (-) pedal edema   Data Reviewed: Basic Metabolic Panel:  Recent Labs Lab 09/11/13 2240 09/12/13 0608 09/13/13 0440 09/14/13 0741 09/15/13 0426  NA 134* 139 137 141 139  K 3.3* 4.2 3.9 3.7 3.2*  CL 98 103 103 104 104  CO2 22 24 22 24 23   GLUCOSE 130* 146* 108* 100* 102*  BUN 12 12 9 7  5*  CREATININE 1.10 1.02 1.08 1.14* 1.10  CALCIUM 9.9 9.6 8.9 9.2 9.0  MG  --  2.1  --  2.0 2.0  PHOS  --  3.5  --   --   --    Liver Function Tests:  Recent Labs Lab 09/11/13 2240 09/12/13 0608 09/14/13 0741 09/15/13 0426   AST 48* 61* 75* 47*  ALT 58* 78* 123* 99*  ALKPHOS 98 92 161* 158*  BILITOT 1.2 1.2 1.2 1.0  PROT 7.6 6.9 6.4 6.5  ALBUMIN 3.5 3.0* 2.6* 2.6*    Recent Labs Lab 09/11/13 2240  LIPASE 24   No results found for this basename: AMMONIA,  in the last 168 hours CBC:  Recent Labs Lab 09/11/13 2240 09/12/13 0608 09/13/13 0440 09/14/13 0741 09/15/13 0426  WBC 15.5* 16.5* 10.6* 8.4 7.2  NEUTROABS 13.4*  --   --  6.0 4.9  HGB 12.8 11.9* 10.8* 10.7* 10.6*  HCT 37.2 34.0* 32.1* 31.3* 31.0*  MCV 84.7 85.0 87.0 86.0 84.7  PLT 213 203 194 205 215   Cardiac Enzymes: No results found for this basename: CKTOTAL, CKMB, CKMBINDEX, TROPONINI,  in the last 168 hours BNP (last 3 results)  Recent Labs  02/20/13 1600  PROBNP 9.6   CBG: No results found for this basename: GLUCAP,  in the last 168 hours  No results found for this or any previous visit (from the past 240 hour(s)).   Studies: No results found.  Scheduled Meds: . enoxaparin (LOVENOX) injection  40 mg Subcutaneous Q24H  . levothyroxine  125 mcg Oral QAC breakfast  . morphine      . piperacillin-tazobactam (ZOSYN)  IV  3.375 g Intravenous Q8H  . polyethylene glycol  17 g Oral Daily   Continuous Infusions: . sodium chloride 100 mL/hr (09/15/13 2142)    Active Problems:   Hypokalemia   Hypertension   Unspecified hypothyroidism   Sickle cell trait   Pericolonic abscess due to diverticulitis   Diverticulitis large intestine    Time spent: 40 minutes   WOODS, CURTIS, J  [facility] Pager [phone]. If 7PM-7AM, please contact night-coverage at [email or web address], password [facility] 09/15/2013, 11:35 PM  LOS: 4 days

## 2013-09-15 NOTE — Progress Notes (Signed)
[facility] Progress Note  Subjective: Patient still experiencing more abdominal pain more centralized less localized to the lower quadrants than yesterday no nausea or vomiting  Objective: Vital signs in last 24 hours: Temp:  [97.6 F (36.4 C)-99.2 F (37.3 C)] 99.2 F (37.3 C) (01/02 0512) Pulse Rate:  [67-78] 67 (01/02 0512) Resp:  [16-18] 18 (01/02 0512) BP: (133-152)/(60-92) 133/60 mmHg (01/02 0512) SpO2:  [97 %-100 %] 97 % (01/02 0512) Weight change:    PE: Abdomen fairly markedly tender throughout with some guarding although no rebound  Lab Results: Results for orders placed during the hospital encounter of 09/11/13 (from the past 24 hour(s))  COMPREHENSIVE METABOLIC PANEL     Status: Abnormal   Collection Time    09/15/13  4:26 AM      Result Value Range   Sodium 139  137 - 147 mEq/L   Potassium 3.2 (*) 3.7 - 5.3 mEq/L   Chloride 104  96 - 112 mEq/L   CO2 23  19 - 32 mEq/L   Glucose, Bld 102 (*) 70 - 99 mg/dL   BUN 5 (*) 6 - 23 mg/dL   Creatinine, Ser 1.10  0.50 - 1.10 mg/dL   Calcium 9.0  8.4 - 10.5 mg/dL   Total Protein 6.5  6.0 - 8.3 g/dL   Albumin 2.6 (*) 3.5 - 5.2 g/dL   AST 47 (*) 0 - 37 U/L   ALT 99 (*) 0 - 35 U/L   Alkaline Phosphatase 158 (*) 39 - 117 U/L   Total Bilirubin 1.0  0.3 - 1.2 mg/dL   GFR calc non Af Amer 50 (*) >90 mL/min   GFR calc Af Amer 58 (*) >90 mL/min  CBC WITH DIFFERENTIAL     Status: Abnormal   Collection Time    09/15/13  4:26 AM      Result Value Range   WBC 7.2  4.0 - 10.5 K/uL   RBC 3.66 (*) 3.87 - 5.11 MIL/uL   Hemoglobin 10.6 (*) 12.0 - 15.0 g/dL   HCT 31.0 (*) 36.0 - 46.0 %   MCV 84.7  78.0 - 100.0 fL   MCH 29.0  26.0 - 34.0 pg   MCHC 34.2  30.0 - 36.0 g/dL   RDW 13.1  11.5 - 15.5 %   Platelets 215  150 - 400 K/uL   Neutrophils Relative % 69  43 - 77 %   Neutro Abs 4.9  1.7 - 7.7 K/uL   Lymphocytes Relative 19  12 - 46 %   Lymphs Abs 1.3  0.7 - 4.0 K/uL   Monocytes Relative 9  3 - 12 %   Monocytes  Absolute 0.6  0.1 - 1.0 K/uL   Eosinophils Relative 4  0 - 5 %   Eosinophils Absolute 0.3  0.0 - 0.7 K/uL   Basophils Relative 0  0 - 1 %   Basophils Absolute 0.0  0.0 - 0.1 K/uL  MAGNESIUM     Status: None   Collection Time    09/15/13  4:26 AM      Result Value Range   Magnesium 2.0  1.5 - 2.5 mg/dL    Studies/Results: No results found.    Assessment: Diverticulitis with diverticular abscess  Plan: Broad-spectrum antibiotics with followup imaging in one or 2 weeks to see if her resolution of abscess. If not will need percutaneous or surgical drainage. Will hold diet clear liquids for now.    Shonn Farruggia C 09/15/2013, 10:17 AM

## 2013-09-15 NOTE — Progress Notes (Signed)
PATIENT HAD EPISODE OF FEELING A LITTLE DIZZY WHEN SHE STOOD UP, ORTHOSTATICS DONE ,NOT ORTHOSTATIC, FEELING OF DIZZINESS WENT AWAY. WILL CONTINUE TO MONITOR. ENCOURAGED PATIENT NOT TO GET OOB WITHOUT ASSISTANCE. Dondra Prader, RN

## 2013-09-16 ENCOUNTER — Inpatient Hospital Stay (HOSPITAL_COMMUNITY): Payer: Medicare Other

## 2013-09-16 LAB — COMPREHENSIVE METABOLIC PANEL
ALT: 84 U/L — ABNORMAL HIGH (ref 0–35)
AST: 44 U/L — ABNORMAL HIGH (ref 0–37)
Albumin: 2.6 g/dL — ABNORMAL LOW (ref 3.5–5.2)
Alkaline Phosphatase: 162 U/L — ABNORMAL HIGH (ref 39–117)
BUN: 4 mg/dL — ABNORMAL LOW (ref 6–23)
CO2: 25 mEq/L (ref 19–32)
Calcium: 9.3 mg/dL (ref 8.4–10.5)
Chloride: 107 mEq/L (ref 96–112)
Creatinine, Ser: 1.04 mg/dL (ref 0.50–1.10)
GFR calc Af Amer: 63 mL/min — ABNORMAL LOW (ref >90)
GFR calc non Af Amer: 54 mL/min — ABNORMAL LOW (ref >90)
Glucose, Bld: 100 mg/dL — ABNORMAL HIGH (ref 70–99)
Potassium: 3.5 mEq/L — ABNORMAL LOW (ref 3.7–5.3)
Sodium: 144 mEq/L (ref 137–147)
Total Bilirubin: 1 mg/dL (ref 0.3–1.2)
Total Protein: 6.5 g/dL (ref 6.0–8.3)

## 2013-09-16 LAB — CBC WITH DIFFERENTIAL/PLATELET
Basophils Absolute: 0 10*3/uL (ref 0.0–0.1)
Basophils Relative: 0 % (ref 0–1)
Eosinophils Absolute: 0.3 10*3/uL (ref 0.0–0.7)
Eosinophils Relative: 4 % (ref 0–5)
HCT: 31.3 % — ABNORMAL LOW (ref 36.0–46.0)
Hemoglobin: 10.6 g/dL — ABNORMAL LOW (ref 12.0–15.0)
Lymphocytes Relative: 16 % (ref 12–46)
Lymphs Abs: 1.4 10*3/uL (ref 0.7–4.0)
MCH: 29 pg (ref 26.0–34.0)
MCHC: 33.9 g/dL (ref 30.0–36.0)
MCV: 85.8 fL (ref 78.0–100.0)
Monocytes Absolute: 0.6 10*3/uL (ref 0.1–1.0)
Monocytes Relative: 7 % (ref 3–12)
Neutro Abs: 6.4 10*3/uL (ref 1.7–7.7)
Neutrophils Relative %: 73 % (ref 43–77)
Platelets: 214 10*3/uL (ref 150–400)
RBC: 3.65 MIL/uL — ABNORMAL LOW (ref 3.87–5.11)
RDW: 13.1 % (ref 11.5–15.5)
WBC: 8.9 10*3/uL (ref 4.0–10.5)

## 2013-09-16 LAB — MAGNESIUM: Magnesium: 2 mg/dL (ref 1.5–2.5)

## 2013-09-16 NOTE — Progress Notes (Signed)
[facility] PROGRESS NOTE Subjective patient states that she is passing gas still having pain. She feels the pain may be worse today than yesterday that she is still passing colitis. Her CT scan was 5 days ago and she has been in the hospital starting her 5th day of antibiotics.  Objective: Vital signs in last 24 hours: Temp:  [98 F (36.7 C)-98.4 F (36.9 C)] 98.3 F (36.8 C) (01/03 0510) Pulse Rate:  [58-64] 58 (01/03 0510) Resp:  [14-18] 14 (01/03 0510) BP: (107-140)/(65-72) 138/72 mmHg (01/03 0510) SpO2:  [92 %-98 %] 92 % (01/03 0510) Last BM Date: 09/11/13  Intake/Output from previous day: 01/02 0701 - 01/03 0700 In: 1626.7 [P.O.:590; I.V.:1036.7] Out: 4275 [Urine:4275] Intake/Output this shift:    PE: General-- no severe distress  Abdomen-- none distended with positive bowel sounds. His moderate lower abdominal tenderness is not really localized.  Lab Results:  Recent Labs  09/14/13 0741 09/15/13 0426 09/16/13 0501  WBC 8.4 7.2 8.9  HGB 10.7* 10.6* 10.6*  HCT 31.3* 31.0* 31.3*  PLT 205 215 214   BMET  Recent Labs  09/14/13 0741 09/15/13 0426 09/16/13 0501  NA 141 139 144  K 3.7 3.2* 3.5*  CL 104 104 107  CO2 24 23 25   CREATININE 1.14* 1.10 1.04   LFT  Recent Labs  09/14/13 0741 09/15/13 0426 09/16/13 0501  PROT 6.4 6.5 6.5  AST 75* 47* 44*  ALT 123* 99* 84*  ALKPHOS 161* 158* 162*  BILITOT 1.2 1.0 1.0   PT/INR No results found for this basename: LABPROT, INR,  in the last 72 hours PANCREAS No results found for this basename: LIPASE,  in the last 72 hours       Studies/Results: No results found.  Medications: I have reviewed the patient's current medications.  Assessment/Plan: 1. Diverticulitis with peridiverticular abscess. Patient is now been on 5 days of antibiotics and is not sure that she is improving. It's possible that the abscess is expanding. We will plan on going ahead with repeat CT scan tomorrow to reassess  the abscess.   Amandalee Lacap JR,Severt L 09/16/2013, 12:14 PM

## 2013-09-16 NOTE — Progress Notes (Signed)
PATIENT'S PAIN NOT CONTROLLED, CALLED ON CALL MD, ORDERS RECEIVED, Jalayne Ganesh, RN

## 2013-09-16 NOTE — Progress Notes (Signed)
[facility] PROGRESS NOTE  Valerie Jackson QJJ:[id number] DOB: 09-Mar-1946 DOA: 09/11/2013 PCP: Valerie Calico, MD  Assessment/Plan: Diverticulitis with 1.5 cm abscess  1st episode of diverticulitis.  -Continue  IV Zosyn , leukocytosis has resolved  -Continued Positive flatulence today, however still no bowel movement continue MiraLax daily   -Continue morphine for pain control   - Consulted GI Dr. Lizbeth Jackson [facility]); agreed with continued anabiotic for 2-4 weeks with reimaging of abdomen at the two-week mark. If no resolution of abscess IR to perform (percutaneous drainage of abscess). NOTE;  Dr. Laurence Jackson [facility] GI) saw patient today and would like to obtain an abdominal and pelvic CT in the a.m. instead of waiting for the previously discussed 2 weeks.  -Continue patient n.p.o. with ice chips  -Continue normal saline at 143m/hr to ensure patient does not become dehydrated  Hypertension  Stable continue to monitor   Hypokalemia  Resolved with IV supplementation. Continue to monitor  Hypothyroidism -Continue home medication  DVT prophylaxis -Patient has refused Lovenox and SCDs counseled on the sequela of not following this treatment.   Code Status: Full  Family Communication: No family available for discussion of plan Disposition Plan: Resolution of abscess/diverticulitis   [facility]   Procedures: CT abdomen pelvis with contrast 09/11/2013 Diverticulosis with acute diverticulitis in the sigmoid colon.  Pericolonic infiltration with small abscess measuring 1.5 cm  diameter.  Antibiotics: Zosyn 12/30>>   HPI/Subjective: 68yo BF PMHx sickle cell trait, HTN, hypothyroidism, hypokalemia. Presented with 2 day history of abdominal pain associated with nausea and vomiting. Denies fever chills or diarrhea. Pain mainly in the center of abdomen as well as right lower quadrant. CT scan was done showing diverticulitis to 1.5 cm abscess. ER  physician spoke to Gen. surgery with this point recommends medicine admission with the IV antibiotics. 09/13/2013 patient states still having mid abdominal pain especially when she moves/changes position positive flatulence negative BM. 09/14/2013 states abdomen is feeling improved, positive bowel sounds, negative nausea/vomiting with clear liquids, positive flatulence. States still no BM. States [facility] her today. 09/15/2013 patient complained of increased abdominal pain which is now situated more in the left lower quadrant left upper quadrant of the abdomen. Pain in the epigastric area has decreased. 09/16/2013 she concerned because he was visited this afternoon to surgeons who were informing her she might have to have a hemicolectomy with colostomy, she was unsure of the name of the physician or who placed consult. Currently she feels she is improving, she's had decreased requirement for pain medication, and been able to ambulate around room and to bathroom, without to much discomfort..      Objective: Filed Vitals:   09/15/13 0512 09/15/13 1400 09/15/13 2251 09/16/13 0510  BP: 133/60 107/65 140/65 138/72  Pulse: 67 64 58 58  Temp: 99.2 F (37.3 C) 98 F (36.7 C) 98.4 F (36.9 C) 98.3 F (36.8 C)  TempSrc: Oral Oral Oral Oral  Resp: 18 18 14 14   Height:      Weight:      SpO2: 97% 98% 97% 92%    Intake/Output Summary (Last 24 hours) at 09/16/13 0744 Last data filed at 09/16/13 0314  Gross per 24 hour  Intake 1626.67 ml  Output   4275 ml  Net -2648.33 ml   Filed Weights   09/11/13 2150 09/12/13 0303  Weight: 83.915 kg (185 lb) 85.231 kg (187 lb 14.4 oz)    Exam:   General: A./O.  x4, mild - moderate abdominal pain now centered more in the LLQ/RUQ of abdomen, mild epigastric pain to palpation   Cardiovascular: RRR, (-) M/R/G, DP/PT pulse (+1) bilat  Respiratory: CTA Bilat  Abdomen: Diffuse Abd tenderness LLQ>RLQ>LLQ, (+) Bowel sounds; positive flatus     Musculoskeletal: (-) pedal edema   Data Reviewed: Basic Metabolic Panel:  Recent Labs Lab 09/11/13 2240 09/12/13 0608 09/13/13 0440 09/14/13 0741 09/15/13 0426 09/16/13 0501  NA 134* 139 137 141 139 144  K 3.3* 4.2 3.9 3.7 3.2* 3.5*  CL 98 103 103 104 104 107  CO2 22 24 22 24 23 25   GLUCOSE 130* 146* 108* 100* 102* 100*  BUN 12 12 9 7  5* 4*  CREATININE 1.10 1.02 1.08 1.14* 1.10 1.04  CALCIUM 9.9 9.6 8.9 9.2 9.0 9.3  MG  --  2.1  --  2.0 2.0 2.0  PHOS  --  3.5  --   --   --   --    Liver Function Tests:  Recent Labs Lab 09/11/13 2240 09/12/13 0608 09/14/13 0741 09/15/13 0426 09/16/13 0501  AST 48* 61* 75* 47* 44*  ALT 58* 78* 123* 99* 84*  ALKPHOS 98 92 161* 158* 162*  BILITOT 1.2 1.2 1.2 1.0 1.0  PROT 7.6 6.9 6.4 6.5 6.5  ALBUMIN 3.5 3.0* 2.6* 2.6* 2.6*    Recent Labs Lab 09/11/13 2240  LIPASE 24   No results found for this basename: AMMONIA,  in the last 168 hours CBC:  Recent Labs Lab 09/11/13 2240 09/12/13 0608 09/13/13 0440 09/14/13 0741 09/15/13 0426 09/16/13 0501  WBC 15.5* 16.5* 10.6* 8.4 7.2 8.9  NEUTROABS 13.4*  --   --  6.0 4.9 6.4  HGB 12.8 11.9* 10.8* 10.7* 10.6* 10.6*  HCT 37.2 34.0* 32.1* 31.3* 31.0* 31.3*  MCV 84.7 85.0 87.0 86.0 84.7 85.8  PLT 213 203 194 205 215 214   Cardiac Enzymes: No results found for this basename: CKTOTAL, CKMB, CKMBINDEX, TROPONINI,  in the last 168 hours BNP (last 3 results)  Recent Labs  02/20/13 1600  PROBNP 9.6   CBG: No results found for this basename: GLUCAP,  in the last 168 hours  No results found for this or any previous visit (from the past 240 hour(s)).   Studies: No results found.  Scheduled Meds: . enoxaparin (LOVENOX) injection  40 mg Subcutaneous Q24H  . levothyroxine  125 mcg Oral QAC breakfast  . piperacillin-tazobactam (ZOSYN)  IV  3.375 g Intravenous Q8H  . polyethylene glycol  17 g Oral Daily   Continuous Infusions: . sodium chloride 100 mL/hr (09/15/13 2142)     Active Problems:   Hypokalemia   Hypertension   Unspecified hypothyroidism   Sickle cell trait   Pericolonic abscess due to diverticulitis   Diverticulitis large intestine    Time spent: 40 minutes   [name], J  [facility] Pager [phone]. If 7PM-7AM, please contact night-coverage at [email or web address], password [facility] 09/16/2013, 7:44 AM  LOS: 5 days

## 2013-09-17 ENCOUNTER — Inpatient Hospital Stay (HOSPITAL_COMMUNITY): Payer: Medicare Other

## 2013-09-17 LAB — COMPREHENSIVE METABOLIC PANEL
ALT: 63 U/L — ABNORMAL HIGH (ref 0–35)
AST: 23 U/L (ref 0–37)
Albumin: 2.7 g/dL — ABNORMAL LOW (ref 3.5–5.2)
Alkaline Phosphatase: 146 U/L — ABNORMAL HIGH (ref 39–117)
BUN: 6 mg/dL (ref 6–23)
CO2: 24 mEq/L (ref 19–32)
Calcium: 9.3 mg/dL (ref 8.4–10.5)
Chloride: 104 mEq/L (ref 96–112)
Creatinine, Ser: 1.05 mg/dL (ref 0.50–1.10)
GFR calc Af Amer: 62 mL/min — ABNORMAL LOW (ref >90)
GFR calc non Af Amer: 53 mL/min — ABNORMAL LOW (ref >90)
Glucose, Bld: 87 mg/dL (ref 70–99)
Potassium: 3.2 mEq/L — ABNORMAL LOW (ref 3.7–5.3)
Sodium: 142 mEq/L (ref 137–147)
Total Bilirubin: 0.9 mg/dL (ref 0.3–1.2)
Total Protein: 6.7 g/dL (ref 6.0–8.3)

## 2013-09-17 LAB — CBC WITH DIFFERENTIAL/PLATELET
Basophils Absolute: 0.1 10*3/uL (ref 0.0–0.1)
Basophils Relative: 1 % (ref 0–1)
Eosinophils Absolute: 0.4 10*3/uL (ref 0.0–0.7)
Eosinophils Relative: 5 % (ref 0–5)
HCT: 31 % — ABNORMAL LOW (ref 36.0–46.0)
Hemoglobin: 10.4 g/dL — ABNORMAL LOW (ref 12.0–15.0)
Lymphocytes Relative: 19 % (ref 12–46)
Lymphs Abs: 1.6 10*3/uL (ref 0.7–4.0)
MCH: 28.4 pg (ref 26.0–34.0)
MCHC: 33.5 g/dL (ref 30.0–36.0)
MCV: 84.7 fL (ref 78.0–100.0)
Monocytes Absolute: 0.6 10*3/uL (ref 0.1–1.0)
Monocytes Relative: 7 % (ref 3–12)
Neutro Abs: 5.6 10*3/uL (ref 1.7–7.7)
Neutrophils Relative %: 69 % (ref 43–77)
Platelets: 238 10*3/uL (ref 150–400)
RBC: 3.66 MIL/uL — ABNORMAL LOW (ref 3.87–5.11)
RDW: 13 % (ref 11.5–15.5)
WBC: 8.2 10*3/uL (ref 4.0–10.5)

## 2013-09-17 LAB — MAGNESIUM: Magnesium: 2 mg/dL (ref 1.5–2.5)

## 2013-09-17 MED ORDER — IOHEXOL 300 MG/ML  SOLN
50.0000 mL | Freq: Once | INTRAMUSCULAR | Status: AC | PRN
  Administered 2013-09-17: 50 mL via ORAL

## 2013-09-17 MED ORDER — IOHEXOL 300 MG/ML  SOLN
100.0000 mL | Freq: Once | INTRAMUSCULAR | Status: AC | PRN
  Administered 2013-09-17: 100 mL via INTRAVENOUS

## 2013-09-17 NOTE — Progress Notes (Addendum)
IR aware of request for CT guided abscess aspiration/poss drain for diverticular abscess. Imaging reviewed with Dr. Laurence Ferrari. Abscess is slightly larger and amenable to at least aspiration, possible drain placement. Keep pt NPO Hold Lovenox tomorrow. IR PA will see and d/w pt prior to procedure.  Ascencion Dike PA-C Interventional Radiology 09/17/2013 12:29 PM

## 2013-09-17 NOTE — Progress Notes (Signed)
[facility] PROGRESS NOTE Subjective Pt still having some pain ? Worse after the CT contrast. CT shows persistent abcess probably larger no perforation.  Objective: Vital signs in last 24 hours: Temp:  [98.4 F (36.9 C)-98.9 F (37.2 C)] 98.7 F (37.1 C) (01/04 0619) Pulse Rate:  [63-72] 63 (01/04 0619) Resp:  [16] 16 (01/04 0619) BP: (128-157)/(81-88) 138/83 mmHg (01/04 0619) SpO2:  [96 %-100 %] 96 % (01/04 0619) Last BM Date: 09/11/13  Intake/Output from previous day: 01/03 0701 - 01/04 0700 In: 5393.3 [P.O.:480; I.V.:4113.3; IV Piggyback:800] Out: 3100 [Urine:3100] Intake/Output this shift:    PE: General--NAD  Abdomen--+ BSs but still tender diffusely across the lower abd   Lab Results:  Recent Labs  09/15/13 0426 09/16/13 0501 09/17/13 0508  WBC 7.2 8.9 8.2  HGB 10.6* 10.6* 10.4*  HCT 31.0* 31.3* 31.0*  PLT 215 214 238   BMET  Recent Labs  09/15/13 0426 09/16/13 0501 09/17/13 0508  NA 139 144 142  K 3.2* 3.5* 3.2*  CL 104 107 104  CO2 23 25 24   CREATININE 1.10 1.04 1.05   LFT  Recent Labs  09/15/13 0426 09/16/13 0501 09/17/13 0508  PROT 6.5 6.5 6.7  AST 47* 44* 23  ALT 99* 84* 63*  ALKPHOS 158* 162* 146*  BILITOT 1.0 1.0 0.9   PT/INR No results found for this basename: LABPROT, INR,  in the last 72 hours PANCREAS No results found for this basename: LIPASE,  in the last 72 hours       Studies/Results: Ct Abdomen Pelvis W Contrast  09/17/2013   CLINICAL DATA:  Followup evaluation for sigmoid diverticulitis and pericolonic abscess.  EXAM: CT ABDOMEN AND PELVIS WITH CONTRAST  TECHNIQUE: Multidetector CT imaging of the abdomen and pelvis was performed using the standard protocol following bolus administration of intravenous contrast.  CONTRAST:  29m OMNIPAQUE IOHEXOL 300 MG/ML SOLN, 1066mOMNIPAQUE IOHEXOL 300 MG/ML SOLN  COMPARISON:  CT of the abdomen and pelvis 09/11/2013.  FINDINGS: Lung Bases: Minimal subsegmental  atelectasis in the lower lobes of the lungs bilaterally.  Abdomen/Pelvis: Status post cholecystectomy. Mild intrahepatic biliary ductal dilatation is unchanged compared to the prior study, and appears to be chronic in this patient. Common bile duct is slightly prominent measuring 10 mm in the porta hepatis (also unchanged). No focal hepatic lesions are identified on today's examination. Diffuse decreased attenuation throughout the hepatic parenchyma is suggestive of hepatic steatosis (although this is difficult to say for certain on a contrast-enhanced CT scan). The appearance of the pancreas, spleen and bilateral adrenal glands is unremarkable. Numerous subcentimeter low attenuation lesions in the kidneys bilaterally are unchanged in size, number and pattern of distribution compared to the prior study, and although too small to definitively characterize are favored to represent small cysts.  Again noted are numerous colonic diverticulae, most severe in the region of the sigmoid colon. Immediately inferior to the distal sigmoid colon in the central pelvis, posterior to the uterine body, there is a well-defined 2.4 x 3.4 x 2.0 cm low-attenuation fluid collection with rim enhancement, compatible with a diverticular abscess. This appears slightly larger than the prior study. This is best demonstrated on axial image 75 of series 2, sagittal image 103 of series 6, and coronal image 129 of series 5. Surrounding inflammatory changes are again noted in the associated sigmoid mesocolon. No pneumoperitoneum to suggest frank perforation at this time. Trace volume of ascites. No pathologic distention of small bowel. No definite lymphadenopathy identified within the abdomen  or pelvis. Uterus and bilateral ovaries are unremarkable in appearance. Urinary bladder is normal in appearance.  Musculoskeletal: There are no aggressive appearing lytic or blastic lesions noted in the visualized portions of the skeleton.  IMPRESSION: 1.  Persistent evidence of sigmoid diverticulitis with enlarging diverticular abscess, as discussed above. No signs of frank perforation at this time. 2. Status post cholecystectomy. Persistent mild dilatation of the common bile duct and mild intrahepatic biliary ductal dilatation, which appears to be chronic in this patient. Correlation with liver function tests is recommended. 3. Possible hepatic steatosis. 4. Additional incidental findings, as above.   Electronically Signed   By: Vinnie Langton M.D.   On: 09/17/2013 10:44    Medications: I have reviewed the patient's current medications.  Assessment/Plan: 1. Dirverticulitis with persistent abcess. Pt now has been on AB for 6-7 days and the abcess appears to be getting larger. Feel that need to proceed with CT guided catheter drainage. Have discussed with interventional radiology and with pt. Will order for tomorrow and keep on current ABs    Rosi Secrist JR,Banos L 09/17/2013, 12:29 PM

## 2013-09-17 NOTE — Progress Notes (Signed)
[facility] PROGRESS NOTE  Valerie Jackson NOM:[id number] DOB: 1945-10-20 DOA: 09/11/2013 PCP: Valerie Calico, MD  Assessment/Plan: Diverticulitis with 1.5 cm abscess  1st episode of diverticulitis.  -Continue  IV Zosyn , leukocytosis has resolved  -Continued Positive flatulence today, however still no bowel movement continue MiraLax daily   -Continue morphine for pain control   - Consulted [facility] Dr. Lizbeth Bark [facility]); agreed with continued anabiotic for 2-4 weeks with reimaging of abdomen at the two-week mark. If no resolution of abscess IR to perform (percutaneous drainage of abscess). NOTE;  Dr. Laurence [facility]) saw patient today and obtained and abdominal and pelvic CT see results below;  -Continue patient n.p.o. with ice chips  -Continue normal saline at 166m/hr to ensure patient does not become dehydrated -Per Dr. EOletta Lamasnote patient will be scheduled for IR drainage of abscess on 09/18/2013  Hypertension  Stable continue to monitor   Hypokalemia  Resolved with IV supplementation. Continue to monitor  Hypothyroidism -Continue home medication  DVT prophylaxis -Patient has refused Lovenox and SCDs counseled on the sequela of not following this treatment.   Code Status: Full  Family Communication: No family available for discussion of plan Disposition Plan: Resolution of abscess/diverticulitis   Consultants: Valerie Jackson from Valerie Jackson   Procedures: CT abdomen and pelvis with contrast 09/17/2013 1. Persistent evidence of sigmoid diverticulitis with enlarging  diverticular abscess, as discussed above. No signs of frank  perforation at this time.  2. Status post cholecystectomy. Persistent mild dilatation of the  common bile duct and mild intrahepatic biliary ductal dilatation,  which appears to be chronic in this patient. Correlation with liver  function tests is recommended.  3. Possible hepatic steatosis.    CT abdomen pelvis with contrast  09/11/2013 Diverticulosis with acute diverticulitis in the sigmoid colon.  Pericolonic infiltration with small abscess measuring 1.5 cm  diameter.  Antibiotics: Zosyn 12/30>>   HPI/Subjective: 68yo BF PMHx sickle cell trait, HTN, hypothyroidism, hypokalemia. Presented with 2 day history of abdominal pain associated with nausea and vomiting. Denies fever chills or diarrhea. Pain mainly in the center of abdomen as well as right lower quadrant. CT scan was done showing diverticulitis to 1.5 cm abscess. ER physician spoke to Gen. surgery with this point recommends medicine admission with the IV antibiotics. 09/13/2013 patient states still having mid abdominal pain especially when she moves/changes position positive flatulence negative BM. 09/14/2013 states abdomen is feeling improved, positive bowel sounds, negative nausea/vomiting with clear liquids, positive flatulence. States still no BM. [facility] [place] [facility] 09/15/2013 patient complained of increased abdominal pain which is now situated more in the left lower quadrant left upper quadrant of the abdomen. Pain in the epigastric area has decreased. 09/16/2013 she concerned because he was visited this afternoon to surgeons who were informing her she might have to have a hemicolectomy with colostomy, she was unsure of the name of the physician or who placed consult. Currently she feels she is improving, she's had decreased requirement for pain medication, and been able to ambulate around room and to bathroom, without to much discomfort.. 09/17/2013 patient sitting in bed comfortably, with some anxiety concerning procedure in the a.m.      Objective: Filed Vitals:   09/16/13 1500 09/16/13 2226 09/17/13 0619 09/17/13 1415  BP: 128/81 157/88 138/83 136/80  Pulse: 64 72 63 70  Temp: 98.4 F (36.9 C) 98.9 F (37.2 C) 98.7 F (37.1 C) 98.3 F (36.8 C)  TempSrc: Oral Oral  Oral Oral  Resp: 16 16 16 16   Height:      Weight:       SpO2: 100% 99% 96% 98%    Intake/Output Summary (Last 24 hours) at 09/17/13 1850 Last data filed at 09/17/13 1700  Gross per 24 hour  Intake 5187.33 ml  Output   2400 ml  Net 2787.33 ml   Filed Weights   09/11/13 2150 09/12/13 0303  Weight: 83.915 kg (185 lb) 85.231 kg (187 lb 14.4 oz)    Exam:   General: A./O. x4, mild - moderate abdominal pain now centered more in the LLQ/RUQ of abdomen, mild epigastric pain to palpation   Cardiovascular: RRR, (-) M/R/G, DP/PT pulse (+1) bilat  Respiratory: CTA Bilat  Abdomen: Nontender, nondistended, (+) Bowel sounds; positive flatus    Musculoskeletal: (-) pedal edema   Data Reviewed: Basic Metabolic Panel:  Recent Labs Lab 09/11/13 2240 09/12/13 0608 09/13/13 0440 09/14/13 0741 09/15/13 0426 09/16/13 0501 09/17/13 0508  NA 134* 139 137 141 139 144 142  K 3.3* 4.2 3.9 3.7 3.2* 3.5* 3.2*  CL 98 103 103 104 104 107 104  CO2 22 24 22 24 23 25 24   GLUCOSE 130* 146* 108* 100* 102* 100* 87  BUN 12 12 9 7  5* 4* 6  CREATININE 1.10 1.02 1.08 1.14* 1.10 1.04 1.05  CALCIUM 9.9 9.6 8.9 9.2 9.0 9.3 9.3  MG  --  2.1  --  2.0 2.0 2.0 2.0  PHOS  --  3.5  --   --   --   --   --    Liver Function Tests:  Recent Labs Lab 09/12/13 0608 09/14/13 0741 09/15/13 0426 09/16/13 0501 09/17/13 0508  AST 61* 75* 47* 44* 23  ALT 78* 123* 99* 84* 63*  ALKPHOS 92 161* 158* 162* 146*  BILITOT 1.2 1.2 1.0 1.0 0.9  PROT 6.9 6.4 6.5 6.5 6.7  ALBUMIN 3.0* 2.6* 2.6* 2.6* 2.7*    Recent Labs Lab 09/11/13 2240  LIPASE 24   No results found for this basename: AMMONIA,  in the last 168 hours CBC:  Recent Labs Lab 09/11/13 2240  09/13/13 0440 09/14/13 0741 09/15/13 0426 09/16/13 0501 09/17/13 0508  WBC 15.5*  < > 10.6* 8.4 7.2 8.9 8.2  NEUTROABS 13.4*  --   --  6.0 4.9 6.4 5.6  HGB 12.8  < > 10.8* 10.7* 10.6* 10.6* 10.4*  HCT 37.2  < > 32.1* 31.3* 31.0* 31.3* 31.0*  MCV 84.7  < > 87.0 86.0 84.7 85.8 84.7  PLT 213  < > 194 205 215  214 238  < > = values in this interval not displayed. Cardiac Enzymes: No results found for this basename: CKTOTAL, CKMB, CKMBINDEX, TROPONINI,  in the last 168 hours BNP (last 3 results)  Recent Labs  02/20/13 1600  PROBNP 9.6   CBG: No results found for this basename: GLUCAP,  in the last 168 hours  No results found for this or any previous visit (from the past 240 hour(s)).   Studies: Ct Abdomen Pelvis W Contrast  09/17/2013   CLINICAL DATA:  Followup evaluation for sigmoid diverticulitis and pericolonic abscess.  EXAM: CT ABDOMEN AND PELVIS WITH CONTRAST  TECHNIQUE: Multidetector CT imaging of the abdomen and pelvis was performed using the standard protocol following bolus administration of intravenous contrast.  CONTRAST:  17m OMNIPAQUE IOHEXOL 300 MG/ML SOLN, 1048mOMNIPAQUE IOHEXOL 300 MG/ML SOLN  COMPARISON:  CT of the abdomen and pelvis 09/11/2013.  FINDINGS: Lung  Bases: Minimal subsegmental atelectasis in the lower lobes of the lungs bilaterally.  Abdomen/Pelvis: Status post cholecystectomy. Mild intrahepatic biliary ductal dilatation is unchanged compared to the prior study, and appears to be chronic in this patient. Common bile duct is slightly prominent measuring 10 mm in the porta hepatis (also unchanged). No focal hepatic lesions are identified on today's examination. Diffuse decreased attenuation throughout the hepatic parenchyma is suggestive of hepatic steatosis (although this is difficult to say for certain on a contrast-enhanced CT scan). The appearance of the pancreas, spleen and bilateral adrenal glands is unremarkable. Numerous subcentimeter low attenuation lesions in the kidneys bilaterally are unchanged in size, number and pattern of distribution compared to the prior study, and although too small to definitively characterize are favored to represent small cysts.  Again noted are numerous colonic diverticulae, most severe in the region of the sigmoid colon. Immediately  inferior to the distal sigmoid colon in the central pelvis, posterior to the uterine body, there is a well-defined 2.4 x 3.4 x 2.0 cm low-attenuation fluid collection with rim enhancement, compatible with a diverticular abscess. This appears slightly larger than the prior study. This is best demonstrated on axial image 75 of series 2, sagittal image 103 of series 6, and coronal image 129 of series 5. Surrounding inflammatory changes are again noted in the associated sigmoid mesocolon. No pneumoperitoneum to suggest frank perforation at this time. Trace volume of ascites. No pathologic distention of small bowel. No definite lymphadenopathy identified within the abdomen or pelvis. Uterus and bilateral ovaries are unremarkable in appearance. Urinary bladder is normal in appearance.  Musculoskeletal: There are no aggressive appearing lytic or blastic lesions noted in the visualized portions of the skeleton.  IMPRESSION: 1. Persistent evidence of sigmoid diverticulitis with enlarging diverticular abscess, as discussed above. No signs of frank perforation at this time. 2. Status post cholecystectomy. Persistent mild dilatation of the common bile duct and mild intrahepatic biliary ductal dilatation, which appears to be chronic in this patient. Correlation with liver function tests is recommended. 3. Possible hepatic steatosis. 4. Additional incidental findings, as above.   Electronically Signed   By: Vinnie Langton M.D.   On: 09/17/2013 10:44    Scheduled Meds: . enoxaparin (LOVENOX) injection  40 mg Subcutaneous Q24H  . levothyroxine  125 mcg Oral QAC breakfast  . piperacillin-tazobactam (ZOSYN)  IV  3.375 g Intravenous Q8H  . polyethylene glycol  17 g Oral Daily   Continuous Infusions: . sodium chloride 100 mL/hr at 09/17/13 1305    Active Problems:   Hypokalemia   Hypertension   Unspecified hypothyroidism   Sickle cell trait   Pericolonic abscess due to diverticulitis   Diverticulitis large  intestine    Time spent: 40 minutes   Radha Coggins, J  [facility] Pager [phone]. If 7PM-7AM, please contact night-coverage at [email or web address], password [facility] 09/17/2013, 6:49 PM  LOS: 6 days

## 2013-09-18 ENCOUNTER — Inpatient Hospital Stay (HOSPITAL_COMMUNITY): Payer: Medicare Other

## 2013-09-18 DIAGNOSIS — K5732 Diverticulitis of large intestine without perforation or abscess without bleeding: Secondary | ICD-10-CM

## 2013-09-18 LAB — COMPREHENSIVE METABOLIC PANEL
ALT: 46 U/L — ABNORMAL HIGH (ref 0–35)
AST: 17 U/L (ref 0–37)
Albumin: 2.7 g/dL — ABNORMAL LOW (ref 3.5–5.2)
Alkaline Phosphatase: 133 U/L — ABNORMAL HIGH (ref 39–117)
BUN: 7 mg/dL (ref 6–23)
CO2: 23 mEq/L (ref 19–32)
Calcium: 9.3 mg/dL (ref 8.4–10.5)
Chloride: 103 mEq/L (ref 96–112)
Creatinine, Ser: 1 mg/dL (ref 0.50–1.10)
GFR calc Af Amer: 66 mL/min — ABNORMAL LOW (ref >90)
GFR calc non Af Amer: 57 mL/min — ABNORMAL LOW (ref >90)
Glucose, Bld: 81 mg/dL (ref 70–99)
Potassium: 3.1 mEq/L — ABNORMAL LOW (ref 3.7–5.3)
Sodium: 142 mEq/L (ref 137–147)
Total Bilirubin: 0.9 mg/dL (ref 0.3–1.2)
Total Protein: 6.8 g/dL (ref 6.0–8.3)

## 2013-09-18 LAB — CBC WITH DIFFERENTIAL/PLATELET
Basophils Absolute: 0 10*3/uL (ref 0.0–0.1)
Basophils Relative: 0 % (ref 0–1)
Eosinophils Absolute: 0.4 10*3/uL (ref 0.0–0.7)
Eosinophils Relative: 4 % (ref 0–5)
HCT: 32.2 % — ABNORMAL LOW (ref 36.0–46.0)
Hemoglobin: 10.9 g/dL — ABNORMAL LOW (ref 12.0–15.0)
Lymphocytes Relative: 14 % (ref 12–46)
Lymphs Abs: 1.4 10*3/uL (ref 0.7–4.0)
MCH: 28.7 pg (ref 26.0–34.0)
MCHC: 33.9 g/dL (ref 30.0–36.0)
MCV: 84.7 fL (ref 78.0–100.0)
Monocytes Absolute: 0.7 10*3/uL (ref 0.1–1.0)
Monocytes Relative: 7 % (ref 3–12)
Neutro Abs: 7.4 10*3/uL (ref 1.7–7.7)
Neutrophils Relative %: 75 % (ref 43–77)
Platelets: 238 10*3/uL (ref 150–400)
RBC: 3.8 MIL/uL — ABNORMAL LOW (ref 3.87–5.11)
RDW: 13 % (ref 11.5–15.5)
WBC: 9.9 10*3/uL (ref 4.0–10.5)

## 2013-09-18 LAB — PROTIME-INR
INR: 1.15 (ref 0.00–1.49)
Prothrombin Time: 14.5 seconds (ref 11.6–15.2)

## 2013-09-18 LAB — POTASSIUM: Potassium: 2.9 mEq/L — CL (ref 3.7–5.3)

## 2013-09-18 LAB — MAGNESIUM: Magnesium: 2 mg/dL (ref 1.5–2.5)

## 2013-09-18 MED ORDER — METRONIDAZOLE 500 MG PO TABS
500.0000 mg | ORAL_TABLET | Freq: Three times a day (TID) | ORAL | Status: DC
  Administered 2013-09-18 – 2013-09-19 (×4): 500 mg via ORAL
  Filled 2013-09-18 (×6): qty 1

## 2013-09-18 MED ORDER — POTASSIUM CHLORIDE 10 MEQ/100ML IV SOLN
10.0000 meq | INTRAVENOUS | Status: AC
  Administered 2013-09-18 – 2013-09-19 (×3): 10 meq via INTRAVENOUS
  Filled 2013-09-18 (×4): qty 100

## 2013-09-18 NOTE — Progress Notes (Signed)
[facility] PROGRESS NOTE Subjective Pt about same some pain with eating. A different radiologist reviewed CT today and didn't feel aspiration advisable and treat with AB and reconsider in 2-3 weeks if needed. Pt still NPO  Objective: Vital signs in last 24 hours: Temp:  [98.3 F (36.8 C)-98.8 F (37.1 C)] 98.3 F (36.8 C) (01/05 0601) Pulse Rate:  [63-70] 63 (01/05 0601) Resp:  [16-18] 16 (01/05 0601) BP: (134-175)/(71-86) 134/86 mmHg (01/05 0601) SpO2:  [98 %-99 %] 98 % (01/05 0601) Last BM Date: 09/17/13  Intake/Output from previous day: 01/04 0701 - 01/05 0700 In: 1940.7 [I.V.:1790.7; IV Piggyback:150] Out: 2200 [Urine:2200] Intake/Output this shift:    PE: General--NAD  Abdomen--nondistended and soft still tender in lower abdomen  Lab Results:  Recent Labs  09/16/13 0501 09/17/13 0508 09/18/13 0512  WBC 8.9 8.2 9.9  HGB 10.6* 10.4* 10.9*  HCT 31.3* 31.0* 32.2*  PLT 214 238 238   BMET  Recent Labs  09/16/13 0501 09/17/13 0508 09/18/13 0512  NA 144 142 142  K 3.5* 3.2* 3.1*  CL 107 104 103  CO2 25 24 23   CREATININE 1.04 1.05 1.00   LFT  Recent Labs  09/16/13 0501 09/17/13 0508 09/18/13 0512  PROT 6.5 6.7 6.8  AST 44* 23 17  ALT 84* 63* 46*  ALKPHOS 162* 146* 133*  BILITOT 1.0 0.9 0.9   PT/INR  Recent Labs  09/18/13 0512  LABPROT 14.5  INR 1.15   PANCREAS No results found for this basename: LIPASE,  in the last 72 hours       Studies/Results: Ct Abdomen Pelvis W Contrast  09/17/2013   CLINICAL DATA:  Followup evaluation for sigmoid diverticulitis and pericolonic abscess.  EXAM: CT ABDOMEN AND PELVIS WITH CONTRAST  TECHNIQUE: Multidetector CT imaging of the abdomen and pelvis was performed using the standard protocol following bolus administration of intravenous contrast.  CONTRAST:  78m OMNIPAQUE IOHEXOL 300 MG/ML SOLN, 1045mOMNIPAQUE IOHEXOL 300 MG/ML SOLN  COMPARISON:  CT of the abdomen and pelvis 09/11/2013.   FINDINGS: Lung Bases: Minimal subsegmental atelectasis in the lower lobes of the lungs bilaterally.  Abdomen/Pelvis: Status post cholecystectomy. Mild intrahepatic biliary ductal dilatation is unchanged compared to the prior study, and appears to be chronic in this patient. Common bile duct is slightly prominent measuring 10 mm in the porta hepatis (also unchanged). No focal hepatic lesions are identified on today's examination. Diffuse decreased attenuation throughout the hepatic parenchyma is suggestive of hepatic steatosis (although this is difficult to say for certain on a contrast-enhanced CT scan). The appearance of the pancreas, spleen and bilateral adrenal glands is unremarkable. Numerous subcentimeter low attenuation lesions in the kidneys bilaterally are unchanged in size, number and pattern of distribution compared to the prior study, and although too small to definitively characterize are favored to represent small cysts.  Again noted are numerous colonic diverticulae, most severe in the region of the sigmoid colon. Immediately inferior to the distal sigmoid colon in the central pelvis, posterior to the uterine body, there is a well-defined 2.4 x 3.4 x 2.0 cm low-attenuation fluid collection with rim enhancement, compatible with a diverticular abscess. This appears slightly larger than the prior study. This is best demonstrated on axial image 75 of series 2, sagittal image 103 of series 6, and coronal image 129 of series 5. Surrounding inflammatory changes are again noted in the associated sigmoid mesocolon. No pneumoperitoneum to suggest frank perforation at this time. Trace volume of ascites. No pathologic distention of  small bowel. No definite lymphadenopathy identified within the abdomen or pelvis. Uterus and bilateral ovaries are unremarkable in appearance. Urinary bladder is normal in appearance.  Musculoskeletal: There are no aggressive appearing lytic or blastic lesions noted in the visualized  portions of the skeleton.  IMPRESSION: 1. Persistent evidence of sigmoid diverticulitis with enlarging diverticular abscess, as discussed above. No signs of frank perforation at this time. 2. Status post cholecystectomy. Persistent mild dilatation of the common bile duct and mild intrahepatic biliary ductal dilatation, which appears to be chronic in this patient. Correlation with liver function tests is recommended. 3. Possible hepatic steatosis. 4. Additional incidental findings, as above.   Electronically Signed   By: Vinnie Langton M.D.   On: 09/17/2013 10:44    Medications: I have reviewed the patient's current medications.  Assessment/Plan: 1. Diverticulitis with abscess. Radiology prefers to wait  For now. Will start clears and oral flagyl and hope we can send home soon.   Valerie Jackson JR,Silveria L 09/18/2013, 9:45 AM

## 2013-09-18 NOTE — Progress Notes (Signed)
Patient ID: Valerie Jackson, female   DOB: [birth year], 68 y.o.   MRN: [id number] I have reviewed the imaging and consulted with Dr. Marlou Starks of [facility]. I recommend holding off on aspiration of the pelvic fluid collection at this time. The collection is very small and surrounded by bowel. Clinically the patient has improved. Would recommend a follow up scan no sooner than one, preferably two weeks.

## 2013-09-18 NOTE — Progress Notes (Signed)
[facility] PROGRESS NOTE  Raniyah Curenton TXM:[id number] DOB: 10-24-1945 DOA: 09/11/2013 PCP: [name], MD  Assessment/Plan: Diverticulitis with 1.5 cm abscess  1st episode of diverticulitis.  -Continue  IV Zosyn , leukocytosis has resolved  -Continued Positive flatulence today, and diarrhea with clear liquid diet. DC MiraLax    -Continue morphine for pain control   - Consulted GI Dr. Lizbeth Bark [facility]); agreed with continued anabiotic for 2-4 weeks with reimaging of abdomen at the two-week mark. If no resolution of abscess IR to perform (percutaneous drainage of abscess). NOTE;  Dr. Laurence Spates [facility]) saw patient today and obtained and abdominal and pelvic CT see results below;  -Continue patient n.p.o. with ice chips  -Continue normal saline at 116m/hr to ensure patient does not become dehydrated -09/18/2013 Met with  Dr. EOletta Lamasand we agreed the patient should have PICC line placed to allow antibiotics, possible feeding. Will place consult for IR PICC line placement   Hypertension  Stable continue to monitor   Hypokalemia  Will supplement with IV potassium, obtain repeat potassium level in the a.m.  Hypothyroidism -Continue home medication  DVT prophylaxis -Patient has refused Lovenox and SCDs counseled on the sequela of not following this treatment.   Code Status: Full  Family Communication: No family available for discussion of plan Disposition Plan: Resolution of abscess/diverticulitis   Consultants: Dr.Magod, Dr[facility]. PAutumn Messing (surgery)   Procedures: CT abdomen and pelvis with contrast 09/17/2013 1. Persistent evidence of sigmoid diverticulitis with enlarging  diverticular abscess, as discussed above. No signs of frank  perforation at this time.  2. Status post cholecystectomy. Persistent mild dilatation of the  common bile duct and mild intrahepatic biliary ductal dilatation,  which appears to be chronic in this  patient. Correlation with liver  function tests is recommended.  3. Possible hepatic steatosis.    CT abdomen pelvis with contrast 09/11/2013 Diverticulosis with acute diverticulitis in the sigmoid colon.  Pericolonic infiltration with small abscess measuring 1.5 cm  diameter.  Antibiotics: Zosyn 12/30>>   HPI/Subjective: 68yo BF PMHx sickle cell trait, HTN, hypothyroidism, hypokalemia. Presented with 2 day history of abdominal pain associated with nausea and vomiting. Denies fever chills or diarrhea. Pain mainly in the center of abdomen as well as right lower quadrant. CT scan was done showing diverticulitis to 1.5 cm abscess. ER physician spoke to Gen. surgery with this point recommends medicine admission with the IV antibiotics. 09/13/2013 patient states still having mid abdominal pain especially when she moves/changes position positive flatulence negative BM. 09/14/2013 states abdomen is feeling improved, positive bowel sounds, negative nausea/vomiting with clear liquids, positive flatulence. States still no BM. States [facility]. 09/15/2013 patient complained of increased abdominal pain which is now situated more in the left lower quadrant left upper quadrant of the abdomen. Pain in the epigastric area has decreased. 09/16/2013 she concerned because he was visited this afternoon to surgeons who were informing her she might have to have a hemicolectomy with colostomy, she was unsure of the name of the physician or who placed consult. Currently she feels she is improving, she's had decreased requirement for pain medication, and been able to ambulate around room and to bathroom, without to much discomfort.. 09/17/2013 patient sitting in bed comfortably, with some anxiety concerning procedure in the a.m. 09/18/2013 IR and Dr. TMarlou Starksfelt aspiration of colonic abscess was too high risk. Procedure was canceled. Today patient having increased abdominal pain, nausea,  diarrhea after  starting on clear liquid diet for a second time. Negative fever, chills. Negative nausea/vomiting.    Objective: Filed Vitals:   09/17/13 2049 09/17/13 2107 09/18/13 0108 09/18/13 0601  BP: 175/85 172/84 142/71 134/86  Pulse: 70   63  Temp: 98.8 F (37.1 C)   98.3 F (36.8 C)  TempSrc: Oral   Oral  Resp: 18   16  Height:      Weight:      SpO2: 99%   98%    Intake/Output Summary (Last 24 hours) at 09/18/13 2956 Last data filed at 09/18/13 0610  Gross per 24 hour  Intake 1940.67 ml  Output   2200 ml  Net -259.33 ml   Filed Weights   09/11/13 2150 09/12/13 0303  Weight: 83.915 kg (185 lb) 85.231 kg (187 lb 14.4 oz)    Exam:   General: A./O. x4, mild - moderate abdominal pain now centered more in the RLQ/epigastric area of  abdomen, with palpation   Cardiovascular: RRR, (-) M/R/G, DP/PT pulse (+1) bilat  Respiratory: CTA Bilat  Abdomen:  Positive pain epigastric/RLQ to palpation, nondistended, (+) Bowel sounds; positive flatus    Musculoskeletal: (-) pedal edema   Data Reviewed: Basic Metabolic Panel:  Recent Labs Lab 09/11/13 2240  09/12/13 0608  09/14/13 0741 09/15/13 0426 09/16/13 0501 09/17/13 0508 09/18/13 0512  NA 134*  --  139  < > 141 139 144 142 142  K 3.3*  --  4.2  < > 3.7 3.2* 3.5* 3.2* 3.1*  CL 98  --  103  < > 104 104 107 104 103  CO2 22  --  24  < > _0 GLUCOSE 130*  --  146*  < > 100* 102* 100* 87 81  BUN 12  --  12  < > 7 5* 4* 6 7  CREATININE 1.10  --  1.02  < > 1.14* 1.10 1.04 1.05 1.00  CALCIUM 9.9  --  9.6  < > 9.2 9.0 9.3 9.3 9.3  MG  --   < > 2.1  --  2.0 2.0 2.0 2.0 2.0  PHOS  --   --  3.5  --   --   --   --   --   --   < > = values in this interval not displayed. Liver Function Tests:  Recent Labs Lab 09/14/13 0741 09/15/13 0426 09/16/13 0501 09/17/13 0508 09/18/13 0512  AST 75* 47* 44* 23 17  ALT 123* 99* 84* 63* 46*  ALKPHOS 161* 158* 162* 146* 133*  BILITOT 1.2 1.0 1.0 0.9 0.9  PROT 6.4 6.5 6.5 6.7  6.8  ALBUMIN 2.6* 2.6* 2.6* 2.7* 2.7*    Recent Labs Lab 09/11/13 2240  LIPASE 24   No results found for this basename: AMMONIA,  in the last 168 hours CBC:  Recent Labs Lab 09/14/13 0741 09/15/13 0426 09/16/13 0501 09/17/13 0508 09/18/13 0512  WBC 8.4 7.2 8.9 8.2 9.9  NEUTROABS 6.0 4.9 6.4 5.6 7.4  HGB 10.7* 10.6* 10.6* 10.4* 10.9*  HCT 31.3* 31.0* 31.3* 31.0* 32.2*  MCV 86.0 84.7 85.8 84.7 84.7  PLT 205 215 214 238 238   Cardiac Enzymes: No results found for this basename: CKTOTAL, CKMB, CKMBINDEX, TROPONINI,  in the last 168 hours BNP (last 3 results)  Recent Labs  02/20/13 1600  PROBNP 9.6   CBG: No results found for this basename: GLUCAP,  in the last 168 hours  No results found for this or any previous visit (from the past 240 hour(s)).   Studies: Ct Abdomen Pelvis W Contrast  09/17/2013   CLINICAL DATA:  Followup evaluation for sigmoid diverticulitis and pericolonic abscess.  EXAM: CT ABDOMEN AND PELVIS WITH CONTRAST  TECHNIQUE: Multidetector CT imaging of the abdomen and pelvis was performed using the standard protocol following bolus administration of intravenous contrast.  CONTRAST:  42m OMNIPAQUE IOHEXOL 300 MG/ML SOLN, 1042mOMNIPAQUE IOHEXOL 300 MG/ML SOLN  COMPARISON:  CT of the abdomen and pelvis 09/11/2013.  FINDINGS: Lung Bases: Minimal subsegmental atelectasis in the lower lobes of the lungs bilaterally.  Abdomen/Pelvis: Status post cholecystectomy. Mild intrahepatic biliary ductal dilatation is unchanged compared to the prior study, and appears to be chronic in this patient. Common bile duct is slightly prominent measuring 10 mm in the porta hepatis (also unchanged). No focal hepatic lesions are identified on today's examination. Diffuse decreased attenuation throughout the hepatic parenchyma is suggestive of hepatic steatosis (although this is difficult to say for certain on a contrast-enhanced CT scan). The appearance of the pancreas, spleen and  bilateral adrenal glands is unremarkable. Numerous subcentimeter low attenuation lesions in the kidneys bilaterally are unchanged in size, number and pattern of distribution compared to the prior study, and although too small to definitively characterize are favored to represent small cysts.  Again noted are numerous colonic diverticulae, most severe in the region of the sigmoid colon. Immediately inferior to the distal sigmoid colon in the central pelvis, posterior to the uterine body, there is a well-defined 2.4 x 3.4 x 2.0 cm low-attenuation fluid collection with rim enhancement, compatible with a diverticular abscess. This appears slightly larger than the prior study. This is best demonstrated on axial image 75 of series 2, sagittal image 103 of series 6, and coronal image 129 of series 5. Surrounding inflammatory changes are again noted in the associated sigmoid mesocolon. No pneumoperitoneum to suggest frank perforation at this time. Trace volume of ascites. No pathologic distention of small bowel. No definite lymphadenopathy identified within the abdomen or pelvis. Uterus and bilateral ovaries are unremarkable in appearance. Urinary bladder is normal in appearance.  Musculoskeletal: There are no aggressive appearing lytic or blastic lesions noted in the visualized portions of the skeleton.  IMPRESSION: 1. Persistent evidence of sigmoid diverticulitis with enlarging diverticular abscess, as discussed above. No signs of frank perforation at this time. 2. Status post cholecystectomy. Persistent mild dilatation of the common bile duct and mild intrahepatic biliary ductal dilatation, which appears to be chronic in this patient. Correlation with liver function tests is recommended. 3. Possible hepatic steatosis. 4. Additional incidental findings, as above.   Electronically Signed   By: DaVinnie Langton.D.   On: 09/17/2013 10:44    Scheduled Meds: . enoxaparin (LOVENOX) injection  40 mg Subcutaneous Q24H  .  levothyroxine  125 mcg Oral QAC breakfast  . piperacillin-tazobactam (ZOSYN)  IV  3.375 g Intravenous Q8H  . polyethylene glycol  17 g Oral Daily   Continuous Infusions: . sodium chloride 100 mL/hr at 09/18/13 0100    Active Problems:   Hypokalemia   Hypertension   Unspecified hypothyroidism   Sickle cell trait   Pericolonic abscess due to diverticulitis   Diverticulitis large intestine    Time spent: 40 minutes   Jozy Mcphearson, J  [facility] Pager [phone] 7PM-7AM, please contact night-coverage at [email or web address], password [email or web address]/01/2014, 8:22 AM  LOS: 7 days

## 2013-09-18 NOTE — Consult Note (Signed)
Reason for Consult:abdominal pain Referring Physician: Dr. Blase Mess is an 68 y.o. female.  HPI:  The pt is a 68yo bf who presented last week with LLQ abdominal pain. She was found to have diverticulitis of sigmoid colon. She has been on Zosyn and she says she feels much better. Repeat CT yesterday shows a small fluid collection next to colon. She has not had a fever or elevated wbc in several days  Past Medical History  Diagnosis Date  . Hypertension   . Hypothyroid   . Depression   . Hypercholesteremia   . Sickle cell trait   . Embolus   . GERD (gastroesophageal reflux disease)   . History of blood transfusion     Past Surgical History  Procedure Laterality Date  . Cholecystectomy  2002  . Tonsillectomy  1952    Family History  Problem Relation Age of Onset  . Hypertension Mother   . Aneurysm Mother   . Cancer Mother     Uterine  . Thyroid cancer Mother   . Cancer Other   . Thyroid cancer Sister   . Arthritis Sister   . Hyperlipidemia Sister   . Hypertension Sister   . Stroke Sister   . Kidney disease Sister   . Hyperlipidemia Brother   . Thyroid cancer Daughter   . Breast cancer Maternal Grandmother   . Hypertension Maternal Grandmother     Social History:  reports that she has been smoking Cigarettes.  She has a 6.25 pack-year smoking history. She has never used smokeless tobacco. She reports that she does not drink alcohol or use illicit drugs.  Allergies:  Allergies  Allergen Reactions  . Estrogens     Blood clot in leg    Medications: I have reviewed the patient's current medications.  Results for orders placed during the hospital encounter of 09/11/13 (from the past 48 hour(s))  COMPREHENSIVE METABOLIC PANEL     Status: Abnormal   Collection Time    09/17/13  5:08 AM      Result Value Range   Sodium 142  137 - 147 mEq/L   Comment: Please note change in reference range.   Potassium 3.2 (*) 3.7 - 5.3 mEq/L   Comment: Please note change  in reference range.   Chloride 104  96 - 112 mEq/L   CO2 24  19 - 32 mEq/L   Glucose, Bld 87  70 - 99 mg/dL   BUN 6  6 - 23 mg/dL   Creatinine, Ser 1.05  0.50 - 1.10 mg/dL   Calcium 9.3  8.4 - 10.5 mg/dL   Total Protein 6.7  6.0 - 8.3 g/dL   Albumin 2.7 (*) 3.5 - 5.2 g/dL   AST 23  0 - 37 U/L   ALT 63 (*) 0 - 35 U/L   Alkaline Phosphatase 146 (*) 39 - 117 U/L   Total Bilirubin 0.9  0.3 - 1.2 mg/dL   GFR calc non Af Amer 53 (*) >90 mL/min   GFR calc Af Amer 62 (*) >90 mL/min   Comment: (NOTE)     The eGFR has been calculated using the CKD EPI equation.     This calculation has not been validated in all clinical situations.     eGFR's persistently <90 mL/min signify possible Chronic Kidney     Disease.  CBC WITH DIFFERENTIAL     Status: Abnormal   Collection Time    09/17/13  5:08 AM  Result Value Range   WBC 8.2  4.0 - 10.5 K/uL   RBC 3.66 (*) 3.87 - 5.11 MIL/uL   Hemoglobin 10.4 (*) 12.0 - 15.0 g/dL   HCT 31.0 (*) 36.0 - 46.0 %   MCV 84.7  78.0 - 100.0 fL   MCH 28.4  26.0 - 34.0 pg   MCHC 33.5  30.0 - 36.0 g/dL   RDW 13.0  11.5 - 15.5 %   Platelets 238  150 - 400 K/uL   Neutrophils Relative % 69  43 - 77 %   Neutro Abs 5.6  1.7 - 7.7 K/uL   Lymphocytes Relative 19  12 - 46 %   Lymphs Abs 1.6  0.7 - 4.0 K/uL   Monocytes Relative 7  3 - 12 %   Monocytes Absolute 0.6  0.1 - 1.0 K/uL   Eosinophils Relative 5  0 - 5 %   Eosinophils Absolute 0.4  0.0 - 0.7 K/uL   Basophils Relative 1  0 - 1 %   Basophils Absolute 0.1  0.0 - 0.1 K/uL  MAGNESIUM     Status: None   Collection Time    09/17/13  5:08 AM      Result Value Range   Magnesium 2.0  1.5 - 2.5 mg/dL  COMPREHENSIVE METABOLIC PANEL     Status: Abnormal   Collection Time    09/18/13  5:12 AM      Result Value Range   Sodium 142  137 - 147 mEq/L   Comment: Please note change in reference range.   Potassium 3.1 (*) 3.7 - 5.3 mEq/L   Comment: Please note change in reference range.   Chloride 103  96 - 112 mEq/L    CO2 23  19 - 32 mEq/L   Glucose, Bld 81  70 - 99 mg/dL   BUN 7  6 - 23 mg/dL   Creatinine, Ser 1.00  0.50 - 1.10 mg/dL   Calcium 9.3  8.4 - 10.5 mg/dL   Total Protein 6.8  6.0 - 8.3 g/dL   Albumin 2.7 (*) 3.5 - 5.2 g/dL   AST 17  0 - 37 U/L   ALT 46 (*) 0 - 35 U/L   Alkaline Phosphatase 133 (*) 39 - 117 U/L   Total Bilirubin 0.9  0.3 - 1.2 mg/dL   GFR calc non Af Amer 57 (*) >90 mL/min   GFR calc Af Amer 66 (*) >90 mL/min   Comment: (NOTE)     The eGFR has been calculated using the CKD EPI equation.     This calculation has not been validated in all clinical situations.     eGFR's persistently <90 mL/min signify possible Chronic Kidney     Disease.  CBC WITH DIFFERENTIAL     Status: Abnormal   Collection Time    09/18/13  5:12 AM      Result Value Range   WBC 9.9  4.0 - 10.5 K/uL   RBC 3.80 (*) 3.87 - 5.11 MIL/uL   Hemoglobin 10.9 (*) 12.0 - 15.0 g/dL   HCT 32.2 (*) 36.0 - 46.0 %   MCV 84.7  78.0 - 100.0 fL   MCH 28.7  26.0 - 34.0 pg   MCHC 33.9  30.0 - 36.0 g/dL   RDW 13.0  11.5 - 15.5 %   Platelets 238  150 - 400 K/uL   Neutrophils Relative % 75  43 - 77 %   Neutro Abs 7.4  1.7 - 7.7  K/uL   Lymphocytes Relative 14  12 - 46 %   Lymphs Abs 1.4  0.7 - 4.0 K/uL   Monocytes Relative 7  3 - 12 %   Monocytes Absolute 0.7  0.1 - 1.0 K/uL   Eosinophils Relative 4  0 - 5 %   Eosinophils Absolute 0.4  0.0 - 0.7 K/uL   Basophils Relative 0  0 - 1 %   Basophils Absolute 0.0  0.0 - 0.1 K/uL  MAGNESIUM     Status: None   Collection Time    09/18/13  5:12 AM      Result Value Range   Magnesium 2.0  1.5 - 2.5 mg/dL  PROTIME-INR     Status: None   Collection Time    09/18/13  5:12 AM      Result Value Range   Prothrombin Time 14.5  11.6 - 15.2 seconds   INR 1.15  0.00 - 1.49    Ct Abdomen Pelvis W Contrast  09/17/2013   CLINICAL DATA:  Followup evaluation for sigmoid diverticulitis and pericolonic abscess.  EXAM: CT ABDOMEN AND PELVIS WITH CONTRAST  TECHNIQUE: Multidetector CT  imaging of the abdomen and pelvis was performed using the standard protocol following bolus administration of intravenous contrast.  CONTRAST:  80m OMNIPAQUE IOHEXOL 300 MG/ML SOLN, 1070mOMNIPAQUE IOHEXOL 300 MG/ML SOLN  COMPARISON:  CT of the abdomen and pelvis 09/11/2013.  FINDINGS: Lung Bases: Minimal subsegmental atelectasis in the lower lobes of the lungs bilaterally.  Abdomen/Pelvis: Status post cholecystectomy. Mild intrahepatic biliary ductal dilatation is unchanged compared to the prior study, and appears to be chronic in this patient. Common bile duct is slightly prominent measuring 10 mm in the porta hepatis (also unchanged). No focal hepatic lesions are identified on today's examination. Diffuse decreased attenuation throughout the hepatic parenchyma is suggestive of hepatic steatosis (although this is difficult to say for certain on a contrast-enhanced CT scan). The appearance of the pancreas, spleen and bilateral adrenal glands is unremarkable. Numerous subcentimeter low attenuation lesions in the kidneys bilaterally are unchanged in size, number and pattern of distribution compared to the prior study, and although too small to definitively characterize are favored to represent small cysts.  Again noted are numerous colonic diverticulae, most severe in the region of the sigmoid colon. Immediately inferior to the distal sigmoid colon in the central pelvis, posterior to the uterine body, there is a well-defined 2.4 x 3.4 x 2.0 cm low-attenuation fluid collection with rim enhancement, compatible with a diverticular abscess. This appears slightly larger than the prior study. This is best demonstrated on axial image 75 of series 2, sagittal image 103 of series 6, and coronal image 129 of series 5. Surrounding inflammatory changes are again noted in the associated sigmoid mesocolon. No pneumoperitoneum to suggest frank perforation at this time. Trace volume of ascites. No pathologic distention of small  bowel. No definite lymphadenopathy identified within the abdomen or pelvis. Uterus and bilateral ovaries are unremarkable in appearance. Urinary bladder is normal in appearance.  Musculoskeletal: There are no aggressive appearing lytic or blastic lesions noted in the visualized portions of the skeleton.  IMPRESSION: 1. Persistent evidence of sigmoid diverticulitis with enlarging diverticular abscess, as discussed above. No signs of frank perforation at this time. 2. Status post cholecystectomy. Persistent mild dilatation of the common bile duct and mild intrahepatic biliary ductal dilatation, which appears to be chronic in this patient. Correlation with liver function tests is recommended. 3. Possible hepatic steatosis. 4. Additional incidental findings,  as above.   Electronically Signed   By: Vinnie Langton M.D.   On: 09/17/2013 10:44    Review of Systems  Constitutional: Negative.   HENT: Negative.   Eyes: Negative.   Respiratory: Negative.   Cardiovascular: Negative.   Gastrointestinal: Positive for abdominal pain and diarrhea.  Genitourinary: Negative.   Musculoskeletal: Negative.   Skin: Negative.   Neurological: Negative.   Endo/Heme/Allergies: Negative.   Psychiatric/Behavioral: Negative.    Blood pressure 134/86, pulse 63, temperature 98.3 F (36.8 C), temperature source Oral, resp. rate 16, height 5' 5"  (1.651 m), weight 187 lb 14.4 oz (85.231 kg), SpO2 98.00%. Physical Exam  Constitutional: She is oriented to person, place, and time. She appears well-developed and well-nourished.  HENT:  Head: Normocephalic and atraumatic.  Eyes: Conjunctivae and EOM are normal. Pupils are equal, round, and reactive to light.  Neck: Normal range of motion. Neck supple.  Cardiovascular: Normal rate, regular rhythm and normal heart sounds.   Respiratory: Effort normal and breath sounds normal.  GI: Soft. Bowel sounds are normal.  There is tenderness focally in LLQ but no sign of peritonitis   Musculoskeletal: Normal range of motion.  Neurological: She is alert and oriented to person, place, and time.  Skin: Skin is warm and dry.  Psychiatric: She has a normal mood and affect. Her behavior is normal.    Assessment/Plan: The pt appears to have sigmoid diverticulitis with a small abscess associated with it. She is better on IV abx. The fluid collection is very small and there is not a great window to get to it.  My recommendation is to add flagyl for better anaerobic coverage. I would try her on clears to see if she can tolerate it. If she does I would try switching her to oral abx like cipro and flagyl. She would need a repeat CT in a couple of weeks if she is still doing ok. I believe the fluid collection will either resolve or it will get bigger in which case aspiration would be easier and less risky. We will follow  TOTH III,PAUL S 09/18/2013, 9:31 AM

## 2013-09-19 ENCOUNTER — Inpatient Hospital Stay (HOSPITAL_COMMUNITY): Payer: Medicare Other

## 2013-09-19 LAB — CBC WITH DIFFERENTIAL/PLATELET
Basophils Absolute: 0 10*3/uL (ref 0.0–0.1)
Basophils Relative: 0 % (ref 0–1)
Eosinophils Absolute: 0.4 10*3/uL (ref 0.0–0.7)
Eosinophils Relative: 4 % (ref 0–5)
HCT: 33.6 % — ABNORMAL LOW (ref 36.0–46.0)
Hemoglobin: 11.6 g/dL — ABNORMAL LOW (ref 12.0–15.0)
Lymphocytes Relative: 19 % (ref 12–46)
Lymphs Abs: 1.9 10*3/uL (ref 0.7–4.0)
MCH: 29.2 pg (ref 26.0–34.0)
MCHC: 34.5 g/dL (ref 30.0–36.0)
MCV: 84.6 fL (ref 78.0–100.0)
Monocytes Absolute: 0.8 10*3/uL (ref 0.1–1.0)
Monocytes Relative: 8 % (ref 3–12)
Neutro Abs: 7 10*3/uL (ref 1.7–7.7)
Neutrophils Relative %: 69 % (ref 43–77)
Platelets: 280 10*3/uL (ref 150–400)
RBC: 3.97 MIL/uL (ref 3.87–5.11)
RDW: 12.9 % (ref 11.5–15.5)
WBC: 10.1 10*3/uL (ref 4.0–10.5)

## 2013-09-19 LAB — COMPREHENSIVE METABOLIC PANEL
ALT: 36 U/L — ABNORMAL HIGH (ref 0–35)
AST: 15 U/L (ref 0–37)
Albumin: 2.9 g/dL — ABNORMAL LOW (ref 3.5–5.2)
Alkaline Phosphatase: 120 U/L — ABNORMAL HIGH (ref 39–117)
BUN: 4 mg/dL — ABNORMAL LOW (ref 6–23)
CO2: 27 mEq/L (ref 19–32)
Calcium: 9.4 mg/dL (ref 8.4–10.5)
Chloride: 105 mEq/L (ref 96–112)
Creatinine, Ser: 1.08 mg/dL (ref 0.50–1.10)
GFR calc Af Amer: 60 mL/min — ABNORMAL LOW (ref >90)
GFR calc non Af Amer: 52 mL/min — ABNORMAL LOW (ref >90)
Glucose, Bld: 105 mg/dL — ABNORMAL HIGH (ref 70–99)
Potassium: 3.4 mEq/L — ABNORMAL LOW (ref 3.7–5.3)
Sodium: 143 mEq/L (ref 137–147)
Total Bilirubin: 0.9 mg/dL (ref 0.3–1.2)
Total Protein: 7.1 g/dL (ref 6.0–8.3)

## 2013-09-19 LAB — MAGNESIUM: Magnesium: 2 mg/dL (ref 1.5–2.5)

## 2013-09-19 MED ORDER — LIDOCAINE HCL 1 % IJ SOLN
INTRAMUSCULAR | Status: AC
  Filled 2013-09-19: qty 20

## 2013-09-19 NOTE — Procedures (Signed)
Interventional Radiology Procedure Note  Procedure: Placement of right brachial vein dual-lumen PowerPICC.  Tip at superior cavoatrial junction and ready for use. Complications: None immediate. Recommendations:  - Routine line care  Signed,  Criselda Peaches, MD [facility]

## 2013-09-19 NOTE — Care Management Note (Signed)
Cm spoke with patient at the bedside concerning discharge planning. Pt to possibly dc home with IV ABX. Pt offered choice of [place] agencies in [place]. Pt awaiting final decision of IV ABX or oral ABX upon discharge.      Venita Lick Garnet Chatmon,MSN,RN [phone]

## 2013-09-19 NOTE — Care Management Note (Signed)
Cm confirmed with Dr. Sherral Hammers, pt disposition is home when medically stable with IV ABX and possible TPN. Per pt choice [facility] to provide [facility] services at discharge. [facility] Pam Chandler notified. Pt's daughter to assist in home care.     [facility] Chaquita Basques,MSN,RN [phone]

## 2013-09-19 NOTE — Progress Notes (Signed)
INITIAL NUTRITION ASSESSMENT  DOCUMENTATION CODES Per approved criteria  -Obesity Unspecified   INTERVENTION: -Recommend to advance to low fiber diet as tolerated -Monitor PO intake  NUTRITION DIAGNOSIS: Inadequate oral intake related to inability to eat as evidenced by NPO status.   Goal: Pt to meet >/= 90% of their estimated nutrition needs   Monitor:  Diet advancement, total protein/energy intake, GI profile, labs, weights  Reason for Assessment: Malnutrition Screening Tool  68 y.o. female  Admitting Dx: <principal problem not specified>  ASSESSMENT: Valerie Jackson is a 68 y.o. female has a past medical history of Hypertension; Hypothyroid; Depression; Hypercholesteremia; Sickle cell trait; Embolus; GERD (gastroesophageal reflux disease); and History of blood transfusion. Presented with 2 day history of abdominal pain associated with nausea and vomiting. Denies fever chills or diarrhea. Pain mainly in the center of abdomen as well as right lower quadrant. Patient presented to emerge department where a CT scan was done showing diverticulitis to 1.5 cm abscess. ER physician spoke to Gen. surgery with this point recommends medicine admission with the IV antibiotics  -Pt reported gradual intentional wt loss of 20 lbs since 02/2013. -MD noted pt with diverticulitis and abscess. -D/c'd Miralax when pt began experiencing loose stools -100% PO on 1/05 when on clear liquid diet; however pt had feelings of nausea/abd pain post meal and was downgraded to NPO -Pt reported not having solid foods since 12/30. Was eager for diet advancement. RN noted they may trial her clear liquid again today pending GI recommendations -PICC line placed for antibiotics, may assist in pt tolerating diet advancement -Potassium being repleted  Height: Ht Readings from Last 1 Encounters:  09/12/13 5' 5"  (1.651 m)    Weight: Wt Readings from Last 1 Encounters:  09/12/13 187 lb 14.4 oz (85.231 kg)    Ideal  Body Weight: 125 lbs  % Ideal Body Weight: 150%  Wt Readings from Last 10 Encounters:  09/12/13 187 lb 14.4 oz (85.231 kg)  07/19/13 191 lb 8 oz (86.864 kg)  04/17/13 190 lb (86.183 kg)  04/12/13 189 lb (85.73 kg)  02/27/13 206 lb 12 oz (93.781 kg)    Usual Body Weight: 187 lbs  % Usual Body Weight: 100%  BMI:  Body mass index is 31.27 kg/(m^2). Obesity I  Estimated Nutritional Needs: Kcal: 1550-1750 Protein: 80-90 gram Fluid: 2000 ml/daily  Skin: WDL  Diet Order: NPO  EDUCATION NEEDS: -No education needs identified at this time   Intake/Output Summary (Last 24 hours) at 09/19/13 1023 Last data filed at 09/19/13 0600  Gross per 24 hour  Intake 2863.33 ml  Output   2000 ml  Net 863.33 ml    Last BM: 1/05   Labs:   Recent Labs Lab 09/17/13 0508 09/18/13 0512 09/18/13 1501 09/19/13 0510  NA 142 142  --  143  K 3.2* 3.1* 2.9* 3.4*  CL 104 103  --  105  CO2 24 23  --  27  BUN 6 7  --  4*  CREATININE 1.05 1.00  --  1.08  CALCIUM 9.3 9.3  --  9.4  MG 2.0 2.0  --  2.0  GLUCOSE 87 81  --  105*    CBG (last 3)  No results found for this basename: GLUCAP,  in the last 72 hours  Scheduled Meds: . enoxaparin (LOVENOX) injection  40 mg Subcutaneous Q24H  . levothyroxine  125 mcg Oral QAC breakfast  . lidocaine      . metroNIDAZOLE  500 mg Oral  Q8H  . piperacillin-tazobactam (ZOSYN)  IV  3.375 g Intravenous Q8H    Continuous Infusions: . sodium chloride 100 mL/hr at 09/19/13 8756    Past Medical History  Diagnosis Date  . Hypertension   . Hypothyroid   . Depression   . Hypercholesteremia   . Sickle cell trait   . Embolus   . GERD (gastroesophageal reflux disease)   . History of blood transfusion     Past Surgical History  Procedure Laterality Date  . Cholecystectomy  2002  . Tonsillectomy  Media LDN Clinical Dietitian [phone]

## 2013-09-19 NOTE — Progress Notes (Signed)
[facility] PROGRESS NOTE  Valerie Jackson XAJ:[id number] DOB: 1945-11-06 DOA: 09/11/2013 PCP: Valerie Calico, MD  Assessment/Plan: Diverticulitis with 1.5 cm abscess  1st episode of diverticulitis.  -Continue  IV Zosyn , leukocytosis has resolved  -Continued Positive flatulence today, and diarrhea with clear liquid diet. DC MiraLax    -Continue morphine for pain control   - Consulted GI Dr. Lizbeth Bark [facility]); agreed with continued anabiotic for 2-4 weeks with reimaging of abdomen at the two-week mark. If no resolution of abscess IR to perform (percutaneous drainage of abscess). NOTE;  Dr. Laurence Spates [facility]) saw patient today and obtained and abdominal and pelvic CT see results below;  -Continue patient n.p.o. with ice chips  -Continue normal saline at 151m/hr to ensure patient does not become dehydrated -09/18/2013 Met with  Dr. EOletta Lamasand we agreed the patient should have PICC line placed to allow antibiotics, possible feeding. -S/P IR PICC line placement 09/19/2013 -DO NOT RESTART FLAGYL, patient will restart clear liquids today if still having diffuse abdominal pain in the a.m. we will know for sure the cause. We'll then have to make arrangements for continued IV antibiotics and TPN prior to discharge  Hypertension  Stable continue to monitor   Hypokalemia  Will supplement with IV potassium, obtain repeat potassium level in the a.m.  Hypothyroidism -Continue home medication  DVT prophylaxis -Patient has refused Lovenox and SCDs counseled on the sequela of not following this treatment.   Code Status: Full  Family Communication: No family available for discussion of plan Disposition Plan: Resolution of abscess/diverticulitis   Consultants: Dr.Magod, [place] [facility] [place]. PAutumn Messing (surgery)   Procedures: CT abdomen and pelvis with contrast 09/17/2013 1. Persistent evidence of sigmoid diverticulitis with enlarging  diverticular abscess, as discussed  above. No signs of frank  perforation at this time.  2. Status post cholecystectomy. Persistent mild dilatation of the  common bile duct and mild intrahepatic biliary ductal dilatation,  which appears to be chronic in this patient. Correlation with liver  function tests is recommended.  3. Possible hepatic steatosis.    CT abdomen pelvis with contrast 09/11/2013 Diverticulosis with acute diverticulitis in the sigmoid colon.  Pericolonic infiltration with small abscess measuring 1.5 cm  diameter.  Antibiotics: Zosyn 12/30>> Flagyl 1/5>>.stopped 1/6   HPI/Subjective: 68yo BF PMHx sickle cell trait, HTN, hypothyroidism, hypokalemia. Presented with 2 day history of abdominal pain associated with nausea and vomiting. Denies fever chills or diarrhea. Pain mainly in the center of abdomen as well as right lower quadrant. CT scan was done showing diverticulitis to 1.5 cm abscess. ER physician spoke to Gen. surgery with this point recommends medicine admission with the IV antibiotics. 09/13/2013 patient states still having mid abdominal pain especially when she moves/changes position positive flatulence negative BM. 09/14/2013 states abdomen is feeling improved, positive bowel sounds, negative nausea/vomiting with clear liquids, positive flatulence. States still no BM. [facility]. 09/15/2013 patient complained of increased abdominal pain which is now situated more in the left lower quadrant left upper quadrant of the abdomen. Pain in the epigastric area has decreased. 09/16/2013 she concerned because he was visited this afternoon to surgeons who were informing her she might have to have a hemicolectomy with colostomy, she was unsure of the name of the physician or who placed consult. Currently she feels she is improving, she's had decreased requirement for pain medication, and been able to ambulate around room and to bathroom, without  to much discomfort.. 09/17/2013 patient  sitting in bed comfortably, with some anxiety concerning procedure in the a.m. 09/18/2013 IR and Dr. Marlou Starks felt aspiration of colonic abscess was too high risk. Procedure was canceled. Today patient having increased abdominal pain, nausea, diarrhea after starting on clear liquid diet for a second time. Negative fever, chills. Negative nausea/vomiting. 09/19/2013 patient  S/P right arm PICC placement double-lumen. Having increasing diffuse abdominal pain most likely secondary to Flagyl being started PO.     Objective: Filed Vitals:   09/19/13 1105 09/19/13 1130 09/19/13 1155 09/19/13 1333  BP: 126/83 130/84 133/82 144/88  Pulse: 59 60 59 58  Temp: 98 F (36.7 C) 98 F (36.7 C) 98.1 F (36.7 C) 98.1 F (36.7 C)  TempSrc: Oral Oral Oral Oral  Resp: _0 Height:      Weight:      SpO2: 99% 99% 98% 98%    Intake/Output Summary (Last 24 hours) at 09/19/13 1431 Last data filed at 09/19/13 1418  Gross per 24 hour  Intake   1840 ml  Output   2950 ml  Net  -1110 ml   Filed Weights   09/11/13 2150 09/12/13 0303  Weight: 83.915 kg (185 lb) 85.231 kg (187 lb 14.4 oz)    Exam:   General: A./O. x4, mild - moderate abdominal pain now centered more in the RLQ/epigastric area of  abdomen, with palpation   Cardiovascular: RRR, (-) M/R/G, DP/PT pulse (+1) bilat  Respiratory: CTA Bilat  Abdomen:  Positive diffuse pain to palpation, nondistended, (+) Bowel sounds; positive flatus    Musculoskeletal: (-) pedal edema   Data Reviewed: Basic Metabolic Panel:  Recent Labs Lab 09/15/13 0426 09/16/13 0501 09/17/13 0508 09/18/13 0512 09/18/13 1501 09/19/13 0510  NA 139 144 142 142  --  143  K 3.2* 3.5* 3.2* 3.1* 2.9* 3.4*  CL 104 107 104 103  --  105  CO2 _1 --  27  GLUCOSE 102* 100* 87 81  --  105*  BUN 5* 4* 6 7  --  4*  CREATININE 1.10 1.04 1.05 1.00  --  1.08  CALCIUM 9.0 9.3 9.3 9.3  --  9.4  MG 2.0 2.0 2.0 2.0  --  2.0   Liver Function Tests:  Recent  Labs Lab 09/15/13 0426 09/16/13 0501 09/17/13 0508 09/18/13 0512 09/19/13 0510  AST 47* 44* _2 ALT 99* 84* 63* 46* 36*  ALKPHOS 158* 162* 146* 133* 120*  BILITOT 1.0 1.0 0.9 0.9 0.9  PROT 6.5 6.5 6.7 6.8 7.1  ALBUMIN 2.6* 2.6* 2.7* 2.7* 2.9*   No results found for this basename: LIPASE, AMYLASE,  in the last 168 hours No results found for this basename: AMMONIA,  in the last 168 hours CBC:  Recent Labs Lab 09/15/13 0426 09/16/13 0501 09/17/13 0508 09/18/13 0512 09/19/13 0510  WBC 7.2 8.9 8.2 9.9 10.1  NEUTROABS 4.9 6.4 5.6 7.4 7.0  HGB 10.6* 10.6* 10.4* 10.9* 11.6*  HCT 31.0* 31.3* 31.0* 32.2* 33.6*  MCV 84.7 85.8 84.7 84.7 84.6  PLT 215 214 238 238 280   Cardiac Enzymes: No results found for this basename: CKTOTAL, CKMB, CKMBINDEX, TROPONINI,  in the last 168 hours BNP (last 3 results)  Recent Labs  02/20/13 1600  PROBNP 9.6   CBG: No results found for this basename: GLUCAP,  in the last 168 hours  No results found for this or any previous visit (from the past  240 hour(s)).   Studies: No results found.  Scheduled Meds: . enoxaparin (LOVENOX) injection  40 mg Subcutaneous Q24H  . levothyroxine  125 mcg Oral QAC breakfast  . lidocaine      . metroNIDAZOLE  500 mg Oral Q8H  . piperacillin-tazobactam (ZOSYN)  IV  3.375 g Intravenous Q8H   Continuous Infusions: . sodium chloride 100 mL/hr at 09/19/13 3382    Active Problems:   Hypokalemia   Hypertension   Unspecified hypothyroidism   Sickle cell trait   Pericolonic abscess due to diverticulitis   Diverticulitis large intestine    Time spent: 40 minutes   WOODS, CURTIS, J  [facility] Pager [phone]. If 7PM-7AM, please contact night-coverage at [email or web address], password [facility] 09/19/2013, 2:31 PM  LOS: 8 days

## 2013-09-19 NOTE — Progress Notes (Signed)
Subjective: She is feeling better her diet is up to full liquids  Objective: Vital signs in last 24 hours: Temp:  [98.1 F (36.7 C)-98.8 F (37.1 C)] 98.1 F (36.7 C) (01/07 0528) Pulse Rate:  [65-68] 65 (01/07 0528) Resp:  [18] 18 (01/07 0528) BP: (134-149)/(73-84) 134/73 mmHg (01/07 0528) SpO2:  [96 %-99 %] 99 % (01/07 0528) Last BM Date: 09/19/13 360 PO recorded. BM recorded 09/17/13 Clears now up to fulls Afebrile, VSS K+ 3.1 Alk Phos up Day 9 of Zosyn Flagyl discontinued yesterday by Dr. Sherral Hammers. Intake/Output from previous day: 01/06 0701 - 01/07 0700 In: 2910 [P.O.:360; I.V.:2400; IV Piggyback:150] Out: 1250 [Urine:1250] Intake/Output this shift: Total I/O In: 773.3 [I.V.:773.3] Out: -   General appearance: alert, cooperative and no distress GI: soft, she still feels something RLQ, but it's better.  No distension, or peritonitis.  Lab Results:   Recent Labs  09/19/13 0510 09/20/13 0550  WBC 10.1 8.2  HGB 11.6* 10.5*  HCT 33.6* 30.9*  PLT 280 243    BMET  Recent Labs  09/19/13 0510 09/20/13 0550  NA 143 144  K 3.4* 3.1*  CL 105 106  CO2 27 25  GLUCOSE 105* 94  BUN 4* 4*  CREATININE 1.08 1.00  CALCIUM 9.4 9.2   PT/INR  Recent Labs  09/18/13 0512  LABPROT 14.5  INR 1.15     Recent Labs Lab 09/16/13 0501 09/17/13 0508 09/18/13 0512 09/19/13 0510 09/20/13 0550  AST 44* 23 17 15 14   ALT 84* 63* 46* 36* 27  ALKPHOS 162* 146* 133* 120* 99  BILITOT 1.0 0.9 0.9 0.9 0.7  PROT 6.5 6.7 6.8 7.1 6.5  ALBUMIN 2.6* 2.7* 2.7* 2.9* 2.8*     Lipase     Component Value Date/Time   LIPASE 24 09/11/2013 2240     Studies/Results: Ir Fluoro Guide Cv Line Right  09/19/2013   CLINICAL DATA:  68 year old female with a clinical history of pneumonia in need durable central venous access for outpatient IV antibiotic therapy.  EXAM: IR RIGHT FLOURO GUIDE CV LINE; IR ULTRASOUND GUIDANCE VASC ACCESS RIGHT  TECHNIQUE: The right arm was prepped with  chlorhexidine, draped in the usual sterile fashion using maximum barrier technique (cap and mask, sterile gown, sterile gloves, large sterile sheet, hand hygiene and cutaneous antiseptic). Local anesthesia was attained by infiltration with 1% lidocaine.  Ultrasound demonstrated patency of the right brachial vein, and this was documented with an image. Under real-time ultrasound guidance, this vein was accessed with a 21 gauge micropuncture needle and image documentation was performed. The needle was exchanged over a guidewire for a peel-away sheath through which a 36 cm 5 [place] dual lumen Power injectable PICC was advanced, and positioned with its tip at the lower SVC/right atrial junction. Fluoroscopy during the procedure and fluoro spot radiograph confirms appropriate catheter position. The catheter was flushed, secured to the skin with Prolene sutures, and covered with a sterile dressing.  FLUOROSCOPY TIME:  12 seconds  COMPLICATIONS: None.  The patient tolerated the procedure well.  IMPRESSION: Successful placement of a right Power PICC with sonographic and fluoroscopic guidance. The catheter is ready for use.  Signed,  Criselda Peaches, MD  [facility]  [facility]   Electronically Signed   By: Jacqulynn Cadet M.D.   On: 09/19/2013 17:07   Ir US Guide Vasc Access Right  09/19/2013   CLINICAL DATA:  68 year old female with a clinical history of pneumonia in need durable  central venous access for outpatient IV antibiotic therapy.  EXAM: IR RIGHT FLOURO GUIDE CV LINE; IR ULTRASOUND GUIDANCE VASC ACCESS RIGHT  TECHNIQUE: The right arm was prepped with chlorhexidine, draped in the usual sterile fashion using maximum barrier technique (cap and mask, sterile gown, sterile gloves, large sterile sheet, hand hygiene and cutaneous antiseptic). Local anesthesia was attained by infiltration with 1% lidocaine.  Ultrasound demonstrated patency of the right brachial vein, and  this was documented with an image. Under real-time ultrasound guidance, this vein was accessed with a 21 gauge micropuncture needle and image documentation was performed. The needle was exchanged over a guidewire for a peel-away sheath through which a 36 cm 5 [place] dual lumen Power injectable PICC was advanced, and positioned with its tip at the lower SVC/right atrial junction. Fluoroscopy during the procedure and fluoro spot radiograph confirms appropriate catheter position. The catheter was flushed, secured to the skin with Prolene sutures, and covered with a sterile dressing.  FLUOROSCOPY TIME:  12 seconds  COMPLICATIONS: None.  The patient tolerated the procedure well.  IMPRESSION: Successful placement of a right Power PICC with sonographic and fluoroscopic guidance. The catheter is ready for use.  Signed,  Criselda Peaches, MD  [facility]  [facility]   Electronically Signed   By: Jacqulynn Cadet M.D.   On: 09/19/2013 17:07    Medications: . enoxaparin (LOVENOX) injection  40 mg Subcutaneous Q24H  . levothyroxine  125 mcg Oral QAC breakfast  . piperacillin-tazobactam (ZOSYN)  IV  3.375 g Intravenous Q8H    Assessment/Plan Diverticulitis with abscess Hypertension Hypothyroid Depression Hx of Sickle cell trait Hx of PE GERD Hypercholesterolemia   Plan:  Continue antibiotics, she can follow up with primary, or Dr. Oletta Lamas.  Would repeat CT scan in about 7 days.    LOS: 9 days    Vidyuth Belsito 09/20/2013

## 2013-09-19 NOTE — Progress Notes (Signed)
PT Cancellation Note  Patient Details Name: Valerie Jackson MRN: [id number] DOB: Mar 06, 1946   Cancelled Treatment:    Reason Eval/Treat Not Completed: PT screened, no needs identified, will sign off. Pt ambulated 300' independently.    Blondell Reveal Kistler 09/19/2013, 11:17 AM [phone]

## 2013-09-19 NOTE — Progress Notes (Signed)
  Subjective: Overall she seems better but she did have some gas pains during the night. No fever  Objective: Vital signs in last 24 hours: Temp:  [97.9 F (36.6 C)-98.9 F (37.2 C)] 97.9 F (36.6 C) (01/06 0532) Pulse Rate:  [52-72] 72 (01/06 0532) Resp:  [16-18] 18 (01/06 0532) BP: (136-145)/(70-88) 145/79 mmHg (01/06 0532) SpO2:  [96 %-98 %] 96 % (01/06 0532) Last BM Date: 09/18/13  Intake/Output from previous day: 01/05 0701 - 01/06 0700 In: 2863.3 [P.O.:480; I.V.:2383.3] Out: 2400 [Urine:2400] Intake/Output this shift:    Resp: clear to auscultation bilaterally Cardio: regular rate and rhythm GI: soft, focally tender in LLQ. no peritonitis  Lab Results:   Recent Labs  09/18/13 0512 09/19/13 0510  WBC 9.9 10.1  HGB 10.9* 11.6*  HCT 32.2* 33.6*  PLT 238 280   BMET  Recent Labs  09/18/13 0512 09/18/13 1501 09/19/13 0510  NA 142  --  143  K 3.1* 2.9* 3.4*  CL 103  --  105  CO2 23  --  27  GLUCOSE 81  --  105*  BUN 7  --  4*  CREATININE 1.00  --  1.08  CALCIUM 9.3  --  9.4   PT/INR  Recent Labs  09/18/13 0512  LABPROT 14.5  INR 1.15   ABG No results found for this basename: PHART, PCO2, PO2, HCO3,  in the last 72 hours  Studies/Results: No results found.  Anti-infectives: Anti-infectives   Start     Dose/Rate Route Frequency Ordered Stop   09/18/13 1400  metroNIDAZOLE (FLAGYL) tablet 500 mg     500 mg Oral 3 times per day 09/18/13 0953     09/12/13 0315  piperacillin-tazobactam (ZOSYN) IVPB 3.375 g     3.375 g 12.5 mL/hr over 240 Minutes Intravenous 3 times per day 09/12/13 0300        Assessment/Plan: s/p * No surgery found * Agree with adding flagyl Would let her have clears and see if she can tolerate. She did not get much of a trial yesterday  LOS: 8 days    TOTH III,PAUL S 09/19/2013

## 2013-09-20 LAB — COMPREHENSIVE METABOLIC PANEL
ALT: 27 U/L (ref 0–35)
AST: 14 U/L (ref 0–37)
Albumin: 2.8 g/dL — ABNORMAL LOW (ref 3.5–5.2)
Alkaline Phosphatase: 99 U/L (ref 39–117)
BUN: 4 mg/dL — ABNORMAL LOW (ref 6–23)
CO2: 25 mEq/L (ref 19–32)
Calcium: 9.2 mg/dL (ref 8.4–10.5)
Chloride: 106 mEq/L (ref 96–112)
Creatinine, Ser: 1 mg/dL (ref 0.50–1.10)
GFR calc Af Amer: 66 mL/min — ABNORMAL LOW (ref >90)
GFR calc non Af Amer: 57 mL/min — ABNORMAL LOW (ref >90)
Glucose, Bld: 94 mg/dL (ref 70–99)
Potassium: 3.1 mEq/L — ABNORMAL LOW (ref 3.7–5.3)
Sodium: 144 mEq/L (ref 137–147)
Total Bilirubin: 0.7 mg/dL (ref 0.3–1.2)
Total Protein: 6.5 g/dL (ref 6.0–8.3)

## 2013-09-20 LAB — CBC WITH DIFFERENTIAL/PLATELET
Basophils Absolute: 0 10*3/uL (ref 0.0–0.1)
Basophils Relative: 0 % (ref 0–1)
Eosinophils Absolute: 0.4 10*3/uL (ref 0.0–0.7)
Eosinophils Relative: 5 % (ref 0–5)
HCT: 30.9 % — ABNORMAL LOW (ref 36.0–46.0)
Hemoglobin: 10.5 g/dL — ABNORMAL LOW (ref 12.0–15.0)
Lymphocytes Relative: 19 % (ref 12–46)
Lymphs Abs: 1.6 10*3/uL (ref 0.7–4.0)
MCH: 28.8 pg (ref 26.0–34.0)
MCHC: 34 g/dL (ref 30.0–36.0)
MCV: 84.7 fL (ref 78.0–100.0)
Monocytes Absolute: 0.6 10*3/uL (ref 0.1–1.0)
Monocytes Relative: 7 % (ref 3–12)
Neutro Abs: 5.6 10*3/uL (ref 1.7–7.7)
Neutrophils Relative %: 69 % (ref 43–77)
Platelets: 243 10*3/uL (ref 150–400)
RBC: 3.65 MIL/uL — ABNORMAL LOW (ref 3.87–5.11)
RDW: 13.1 % (ref 11.5–15.5)
WBC: 8.2 10*3/uL (ref 4.0–10.5)

## 2013-09-20 LAB — MAGNESIUM: Magnesium: 1.9 mg/dL (ref 1.5–2.5)

## 2013-09-20 MED ORDER — POTASSIUM CHLORIDE 20 MEQ/15ML (10%) PO LIQD
20.0000 meq | Freq: Once | ORAL | Status: AC
  Administered 2013-09-20: 20 meq via ORAL
  Filled 2013-09-20: qty 15

## 2013-09-20 MED ORDER — OXYCODONE-ACETAMINOPHEN 5-325 MG PO TABS: 1.0000 | ORAL_TABLET | ORAL | Status: DC | PRN

## 2013-09-20 NOTE — Progress Notes (Signed)
[facility] PROGRESS NOTE Subjective Pt states she is ok after clears  Objective: Vital signs in last 24 hours: Temp:  [98.1 F (36.7 C)-98.8 F (37.1 C)] 98.1 F (36.7 C) (01/07 0528) Pulse Rate:  [58-68] 65 (01/07 0528) Resp:  [18] 18 (01/07 0528) BP: (133-149)/(73-88) 134/73 mmHg (01/07 0528) SpO2:  [96 %-99 %] 99 % (01/07 0528) Last BM Date: 09/19/13  Intake/Output from previous day: 01/06 0701 - 01/07 0700 In: 2910 [P.O.:360; I.V.:2400; IV Piggyback:150] Out: 1250 [Urine:1250] Intake/Output this shift:    PE: General--laughing no distress Heart-- Lungs-- Abdomen--soft minimal tender  Lab Results:  Recent Labs  09/18/13 0512 09/19/13 0510 09/20/13 0550  WBC 9.9 10.1 8.2  HGB 10.9* 11.6* 10.5*  HCT 32.2* 33.6* 30.9*  PLT 238 280 243   BMET  Recent Labs  09/18/13 0512 09/18/13 1501 09/19/13 0510 09/20/13 0550  NA 142  --  143 144  K 3.1* 2.9* 3.4* 3.1*  CL 103  --  105 106  CO2 23  --  27 25  CREATININE 1.00  --  1.08 1.00   LFT  Recent Labs  09/18/13 0512 09/19/13 0510 09/20/13 0550  PROT 6.8 7.1 6.5  AST 17 15 14   ALT 46* 36* 27  ALKPHOS 133* 120* 99  BILITOT 0.9 0.9 0.7   PT/INR  Recent Labs  09/18/13 0512  LABPROT 14.5  INR 1.15   PANCREAS No results found for this basename: LIPASE,  in the last 72 hours       Studies/Results: Ir Fluoro Guide Cv Line Right  09/19/2013   CLINICAL DATA:  68 year old female with a clinical history of pneumonia in need durable central venous access for outpatient IV antibiotic therapy.  EXAM: IR RIGHT FLOURO GUIDE CV LINE; IR ULTRASOUND GUIDANCE VASC ACCESS RIGHT  TECHNIQUE: The right arm was prepped with chlorhexidine, draped in the usual sterile fashion using maximum barrier technique (cap and mask, sterile gown, sterile gloves, large sterile sheet, hand hygiene and cutaneous antiseptic). Local anesthesia was attained by infiltration with 1% lidocaine.  Ultrasound demonstrated patency  of the right brachial vein, and this was documented with an image. Under real-time ultrasound guidance, this vein was accessed with a 21 gauge micropuncture needle and image documentation was performed. The needle was exchanged over a guidewire for a peel-away sheath through which a 36 cm 5 [place] dual lumen Power injectable PICC was advanced, and positioned with its tip at the lower SVC/right atrial junction. Fluoroscopy during the procedure and fluoro spot radiograph confirms appropriate catheter position. The catheter was flushed, secured to the skin with Prolene sutures, and covered with a sterile dressing.  FLUOROSCOPY TIME:  12 seconds  COMPLICATIONS: None.  The patient tolerated the procedure well.  IMPRESSION: Successful placement of a right Power PICC with sonographic and fluoroscopic guidance. The catheter is ready for use.  Signed,  Criselda Peaches, MD  [facility]  [facility]   Electronically Signed   By: Jacqulynn Cadet M.D.   On: 09/19/2013 17:07   Ir US Guide Vasc Access Right  09/19/2013   CLINICAL DATA:  68 year old female with a clinical history of pneumonia in need durable central venous access for outpatient IV antibiotic therapy.  EXAM: IR RIGHT FLOURO GUIDE CV LINE; IR ULTRASOUND GUIDANCE VASC ACCESS RIGHT  TECHNIQUE: The right arm was prepped with chlorhexidine, draped in the usual sterile fashion using maximum barrier technique (cap and mask, sterile gown, sterile gloves, large sterile sheet, hand hygiene  and cutaneous antiseptic). Local anesthesia was attained by infiltration with 1% lidocaine.  Ultrasound demonstrated patency of the right brachial vein, and this was documented with an image. Under real-time ultrasound guidance, this vein was accessed with a 21 gauge micropuncture needle and image documentation was performed. The needle was exchanged over a guidewire for a peel-away sheath through which a 36 cm 5 [place] dual lumen Power  injectable PICC was advanced, and positioned with its tip at the lower SVC/right atrial junction. Fluoroscopy during the procedure and fluoro spot radiograph confirms appropriate catheter position. The catheter was flushed, secured to the skin with Prolene sutures, and covered with a sterile dressing.  FLUOROSCOPY TIME:  12 seconds  COMPLICATIONS: None.  The patient tolerated the procedure well.  IMPRESSION: Successful placement of a right Power PICC with sonographic and fluoroscopic guidance. The catheter is ready for use.  Signed,  Criselda Peaches, MD  [facility]  [facility]   Electronically Signed   By: Jacqulynn Cadet M.D.   On: 09/19/2013 17:07    Medications: I have reviewed the patient's current medications.  Assessment/Plan: 1. Diverticulitis with abcess. Have discussed in detail with her and daughter. Think she could go home on po Augmentin and FLs with suppliments boast etc. Can f/u with me and/or surgery and repeat CT 3-4 weeks Oral pain meds and miralax to keep the stools sort.   Ebonie Westerlund JR,Hinger L 09/20/2013, 11:54 AM

## 2013-09-20 NOTE — Progress Notes (Signed)
[facility] PROGRESS NOTE  Valerie Jackson KCM:[id number] DOB: 07/01/1946 DOA: 09/11/2013 PCP: [name], MD  Brief narrative: 68 year old female with past medical history of sickle cell trait, hypertension who presented to [facility] 09/11/2013 with ongoing abdominal pain, nausea and vomiting. She was subsequently found to have a diverticular abscess roughly an inch in diameter which managed by NPO and IV antibiotics (Initially flagyl and zosyn but now only on Zosyn) and per IR not for perc drainage as it is too small for drainage. Pt had ongoing complaints of pain and diet was NPO/ CLD and it was even thought about TPN if she continues to struggle with pain. This am she is better and tolerates CLD.   Assessment/Plan:   Principal Problem: Diverticulitis with 1.5 cm abscess  - diet advanced from clear liquids to full liquids - on zosyn but will likely go home with PO Augmentin when ready for discharge - appreciate GI and surgery following  - CT abd to be repeated in about 1 week  Active Problems: Hypokalemia - replete with PO potassium  Code Status: Full  Family Communication: family at the bedside  Disposition Plan: home when stable  Consultants:  Dr.Magod, Dr. Katherine Basset from [facility]  Dr. Autumn Messing, (surgery)   Antibiotics:  Zosyn 12/30>>  Flagyl 1/5>>.stopped 1/6    Leisa Lenz, MD  [facility] Pager [phone]  If 7PM-7AM, please contact night-coverage [email or web address] Password [email or web address] 09/20/2013, 4:21 PM   LOS: 9 days    HPI/Subjective: Feels better this am.   Objective: Filed Vitals:   09/19/13 1155 09/19/13 1333 09/19/13 2026 09/20/13 0528  BP: 133/82 144/88 149/84 134/73  Pulse: 59 58 68 65  Temp: 98.1 F (36.7 C) 98.1 F (36.7 C) 98.8 F (37.1 C) 98.1 F (36.7 C)  TempSrc: Oral Oral Oral Oral  Resp: 18 18 18 18   Height:      Weight:      SpO2: 98% 98% 96% 99%    Intake/Output Summary (Last 24 hours) at 09/20/13 1621 Last data filed at  09/20/13 1344  Gross per 24 hour  Intake 2733.33 ml  Output      0 ml  Net 2733.33 ml    Exam:   General:  Pt is alert, follows commands appropriately, not in acute distress  Cardiovascular: Regular rate and rhythm, S1/S2, no murmurs, no rubs, no gallops  Respiratory: Clear to auscultation bilaterally, no wheezing, no crackles, no rhonchi  Abdomen: Soft, non tender, non distended, bowel sounds present, no guarding  Extremities: No edema, pulses DP and PT palpable bilaterally  Neuro: Grossly nonfocal  Data Reviewed: Basic Metabolic Panel:  Recent Labs Lab 09/16/13 0501 09/17/13 0508 09/18/13 0512 09/18/13 1501 09/19/13 0510 09/20/13 0550  NA 144 142 142  --  143 144  K 3.5* 3.2* 3.1* 2.9* 3.4* 3.1*  CL 107 104 103  --  105 106  CO2 25 24 23   --  27 25  GLUCOSE 100* 87 81  --  105* 94  BUN 4* 6 7  --  4* 4*  CREATININE 1.04 1.05 1.00  --  1.08 1.00  CALCIUM 9.3 9.3 9.3  --  9.4 9.2  MG 2.0 2.0 2.0  --  2.0 1.9   Liver Function Tests:  Recent Labs Lab 09/16/13 0501 09/17/13 0508 09/18/13 0512 09/19/13 0510 09/20/13 0550  AST 44* 23 17 15 14   ALT 84* 63* 46* 36* 27  ALKPHOS 162* 146* 133* 120* 99  BILITOT 1.0 0.9  0.9 0.9 0.7  PROT 6.5 6.7 6.8 7.1 6.5  ALBUMIN 2.6* 2.7* 2.7* 2.9* 2.8*   No results found for this basename: LIPASE, AMYLASE,  in the last 168 hours No results found for this basename: AMMONIA,  in the last 168 hours CBC:  Recent Labs Lab 09/16/13 0501 09/17/13 0508 09/18/13 0512 09/19/13 0510 09/20/13 0550  WBC 8.9 8.2 9.9 10.1 8.2  NEUTROABS 6.4 5.6 7.4 7.0 5.6  HGB 10.6* 10.4* 10.9* 11.6* 10.5*  HCT 31.3* 31.0* 32.2* 33.6* 30.9*  MCV 85.8 84.7 84.7 84.6 84.7  PLT 214 238 238 280 243   Cardiac Enzymes: No results found for this basename: CKTOTAL, CKMB, CKMBINDEX, TROPONINI,  in the last 168 hours BNP: No components found with this basename: POCBNP,  CBG: No results found for this basename: GLUCAP,  in the last 168 hours  No  results found for this or any previous visit (from the past 240 hour(s)).   Studies: Ir Fluoro Guide Cv Line Right  09/19/2013   CLINICAL DATA:  68 year old female with a clinical history of pneumonia in need durable central venous access for outpatient IV antibiotic therapy.  EXAM: IR RIGHT FLOURO GUIDE CV LINE; IR ULTRASOUND GUIDANCE VASC ACCESS RIGHT  TECHNIQUE: The right arm was prepped with chlorhexidine, draped in the usual sterile fashion using maximum barrier technique (cap and mask, sterile gown, sterile gloves, large sterile sheet, hand hygiene and cutaneous antiseptic). Local anesthesia was attained by infiltration with 1% lidocaine.  Ultrasound demonstrated patency of the right brachial vein, and this was documented with an image. Under real-time ultrasound guidance, this vein was accessed with a 21 gauge micropuncture needle and image documentation was performed. The needle was exchanged over a guidewire for a peel-away sheath through which a 36 cm 5 [place] dual lumen Power injectable PICC was advanced, and positioned with its tip at the lower SVC/right atrial junction. Fluoroscopy during the procedure and fluoro spot radiograph confirms appropriate catheter position. The catheter was flushed, secured to the skin with Prolene sutures, and covered with a sterile dressing.  FLUOROSCOPY TIME:  12 seconds  COMPLICATIONS: None.  The patient tolerated the procedure well.  IMPRESSION: Successful placement of a right Power PICC with sonographic and fluoroscopic guidance. The catheter is ready for use.  Signed,  Criselda Peaches, MD  [facility]  [facility]   Electronically Signed   By: Jacqulynn Cadet M.D.   On: 09/19/2013 17:07   Ir US Guide Vasc Access Right  09/19/2013   CLINICAL DATA:  68 year old female with a clinical history of pneumonia in need durable central venous access for outpatient IV antibiotic therapy.  EXAM: IR RIGHT FLOURO GUIDE CV LINE; IR  ULTRASOUND GUIDANCE VASC ACCESS RIGHT  TECHNIQUE: The right arm was prepped with chlorhexidine, draped in the usual sterile fashion using maximum barrier technique (cap and mask, sterile gown, sterile gloves, large sterile sheet, hand hygiene and cutaneous antiseptic). Local anesthesia was attained by infiltration with 1% lidocaine.  Ultrasound demonstrated patency of the right brachial vein, and this was documented with an image. Under real-time ultrasound guidance, this vein was accessed with a 21 gauge micropuncture needle and image documentation was performed. The needle was exchanged over a guidewire for a peel-away sheath through which a 36 cm 5 [place] dual lumen Power injectable PICC was advanced, and positioned with its tip at the lower SVC/right atrial junction. Fluoroscopy during the procedure and fluoro spot radiograph confirms appropriate catheter position. The catheter was  flushed, secured to the skin with Prolene sutures, and covered with a sterile dressing.  FLUOROSCOPY TIME:  12 seconds  COMPLICATIONS: None.  The patient tolerated the procedure well.  IMPRESSION: Successful placement of a right Power PICC with sonographic and fluoroscopic guidance. The catheter is ready for use.  Signed,  Criselda Peaches, MD  [facility]  [facility]   Electronically Signed   By: Jacqulynn Cadet M.D.   On: 09/19/2013 17:07    Scheduled Meds: . enoxaparin (LOVENOX) injection  40 mg Subcutaneous Q24H  . levothyroxine  125 mcg Oral QAC breakfast  . piperacillin-tazobactam (ZOSYN)  IV  3.375 g Intravenous Q8H   Continuous Infusions: . sodium chloride 100 mL/hr (09/20/13 1248)

## 2013-09-20 NOTE — ED Provider Notes (Signed)
Medical screening examination/treatment/procedure(s) were conducted as a shared visit with non-physician practitioner(s) and myself.  I personally evaluated the patient during the encounter.     Pt with lower abdominal pain.  CT scan revealed complicated acute diverticulitis.  Pt was admitted for iv abx and further treatment.  Kathalene Frames, MD 09/20/13 [phone]

## 2013-09-21 LAB — COMPREHENSIVE METABOLIC PANEL
ALT: 22 U/L (ref 0–35)
AST: 13 U/L (ref 0–37)
Albumin: 2.9 g/dL — ABNORMAL LOW (ref 3.5–5.2)
Alkaline Phosphatase: 96 U/L (ref 39–117)
BUN: <3 mg/dL — ABNORMAL LOW (ref 6–23)
CO2: 25 mEq/L (ref 19–32)
Calcium: 9.4 mg/dL (ref 8.4–10.5)
Chloride: 105 mEq/L (ref 96–112)
Creatinine, Ser: 0.97 mg/dL (ref 0.50–1.10)
GFR calc Af Amer: 68 mL/min — ABNORMAL LOW (ref >90)
GFR calc non Af Amer: 59 mL/min — ABNORMAL LOW (ref >90)
Glucose, Bld: 103 mg/dL — ABNORMAL HIGH (ref 70–99)
Potassium: 3.2 mEq/L — ABNORMAL LOW (ref 3.7–5.3)
Sodium: 142 mEq/L (ref 137–147)
Total Bilirubin: 0.6 mg/dL (ref 0.3–1.2)
Total Protein: 6.7 g/dL (ref 6.0–8.3)

## 2013-09-21 LAB — CBC WITH DIFFERENTIAL/PLATELET
Basophils Absolute: 0 10*3/uL (ref 0.0–0.1)
Basophils Relative: 0 % (ref 0–1)
Eosinophils Absolute: 0.4 10*3/uL (ref 0.0–0.7)
Eosinophils Relative: 4 % (ref 0–5)
HCT: 31.8 % — ABNORMAL LOW (ref 36.0–46.0)
Hemoglobin: 10.8 g/dL — ABNORMAL LOW (ref 12.0–15.0)
Lymphocytes Relative: 20 % (ref 12–46)
Lymphs Abs: 2.2 10*3/uL (ref 0.7–4.0)
MCH: 29.1 pg (ref 26.0–34.0)
MCHC: 34 g/dL (ref 30.0–36.0)
MCV: 85.7 fL (ref 78.0–100.0)
Monocytes Absolute: 0.7 10*3/uL (ref 0.1–1.0)
Monocytes Relative: 6 % (ref 3–12)
Neutro Abs: 7.5 10*3/uL (ref 1.7–7.7)
Neutrophils Relative %: 70 % (ref 43–77)
Platelets: 251 10*3/uL (ref 150–400)
RBC: 3.71 MIL/uL — ABNORMAL LOW (ref 3.87–5.11)
RDW: 13.4 % (ref 11.5–15.5)
WBC: 10.7 10*3/uL — ABNORMAL HIGH (ref 4.0–10.5)

## 2013-09-21 LAB — MAGNESIUM: Magnesium: 1.9 mg/dL (ref 1.5–2.5)

## 2013-09-21 MED ORDER — OXYCODONE-ACETAMINOPHEN 5-325 MG PO TABS: 1.0000 | ORAL_TABLET | ORAL | Status: DC | PRN

## 2013-09-21 MED ORDER — ONDANSETRON HCL 4 MG PO TABS: 4.0000 mg | ORAL_TABLET | Freq: Four times a day (QID) | ORAL | Status: DC | PRN

## 2013-09-21 MED ORDER — AMOXICILLIN-POT CLAVULANATE 875-125 MG PO TABS: 1.0000 | ORAL_TABLET | Freq: Two times a day (BID) | ORAL | Status: DC

## 2013-09-21 MED ORDER — POTASSIUM CHLORIDE ER 10 MEQ PO TBCR: 10.0000 meq | EXTENDED_RELEASE_TABLET | Freq: Two times a day (BID) | ORAL | Status: DC

## 2013-09-21 MED ORDER — POTASSIUM CHLORIDE 20 MEQ/15ML (10%) PO LIQD
20.0000 meq | Freq: Once | ORAL | Status: AC
  Administered 2013-09-21: 20 meq via ORAL
  Filled 2013-09-21: qty 15

## 2013-09-21 NOTE — Discharge Instructions (Signed)
Diverticulitis °A diverticulum is a small pouch or sac on the colon. Diverticulosis is the presence of these diverticula on the colon. Diverticulitis is the irritation (inflammation) or infection of diverticula. °CAUSES  °The colon and its diverticula contain bacteria. If food particles block the tiny opening to a diverticulum, the bacteria inside can grow and cause an increase in pressure. This leads to infection and inflammation and is called diverticulitis. °SYMPTOMS  °· Abdominal pain and tenderness. Usually, the pain is located on the left side of your abdomen. However, it could be located elsewhere. °· Fever. °· Bloating. °· Feeling sick to your stomach (nausea). °· Throwing up (vomiting). °· Abnormal stools. °DIAGNOSIS  °Your caregiver will take a history and perform a physical exam. Since many things can cause abdominal pain, other tests may be necessary. Tests may include: °· Blood tests. °· Urine tests. °· X-ray of the abdomen. °· CT scan of the abdomen. °Sometimes, surgery is needed to determine if diverticulitis or other conditions are causing your symptoms. °TREATMENT  °Most of the time, you can be treated without surgery. Treatment includes: °· Resting the bowels by only having liquids for a few days. As you improve, you will need to eat a low-fiber diet. °· Intravenous (IV) fluids if you are losing body fluids (dehydrated). °· Antibiotic medicines that treat infections may be given. °· Pain and nausea medicine, if needed. °· Surgery if the inflamed diverticulum has burst. °HOME CARE INSTRUCTIONS  °· Try a clear liquid diet (broth, tea, or water for as long as directed by your caregiver). You may then gradually begin a low-fiber diet as tolerated.  °A low-fiber diet is a diet with less than 10 grams of fiber. Choose the foods below to reduce fiber in the diet: °· White breads, cereals, rice, and pasta. °· Cooked fruits and vegetables or soft fresh fruits and vegetables without the skin. °· Ground or  well-cooked tender beef, ham, veal, lamb, pork, or poultry. °· Eggs and seafood. °· After your diverticulitis symptoms have improved, your caregiver may put you on a high-fiber diet. A high-fiber diet includes 14 grams of fiber for every 1000 calories consumed. For a standard 2000 calorie diet, you would need 28 grams of fiber. Follow these diet guidelines to help you increase the fiber in your diet. It is important to slowly increase the amount fiber in your diet to avoid gas, constipation, and bloating. °· Choose whole-grain breads, cereals, pasta, and brown rice. °· Choose fresh fruits and vegetables with the skin on. Do not overcook vegetables because the more vegetables are cooked, the more fiber is lost. °· Choose more nuts, seeds, legumes, dried peas, beans, and lentils. °· Look for food products that have greater than 3 grams of fiber per serving on the Nutrition Facts label. °· Take all medicine as directed by your caregiver. °· If your caregiver has given you a follow-up appointment, it is very important that you go. Not going could result in lasting (chronic) or permanent injury, pain, and disability. If there is any problem keeping the appointment, call to reschedule. °SEEK MEDICAL CARE IF:  °· Your pain does not improve. °· You have a hard time advancing your diet beyond clear liquids. °· Your bowel movements do not return to normal. °SEEK IMMEDIATE MEDICAL CARE IF:  °· Your pain becomes worse. °· You have an oral temperature above 102° F (38.9° C), not controlled by medicine. °· You have repeated vomiting. °· You have bloody or black, tarry stools. °·   Symptoms that brought you to your caregiver become worse or are not getting better. °MAKE SURE YOU:  °· Understand these instructions. °· Will watch your condition. °· Will get help right away if you are not doing well or get worse. °Document Released: 06/10/2005 Document Revised: 11/23/2011 Document Reviewed: 10/06/2010 °[employer]® Patient Information  ©2014 [employer]. ° °

## 2013-09-21 NOTE — Progress Notes (Signed)
Patient medically stable. DC instruction given. DC'd to home with daughter.

## 2013-09-21 NOTE — Discharge Summary (Signed)
Physician Discharge Summary  Valerie Jackson TDV:[id number] DOB: 05/27/1946 DOA: 09/11/2013  PCP: [name], MD  Admit date: 09/11/2013 Discharge date: 09/21/2013  Recommendations for Outpatient Follow-up:  1. Please followup with GI per scheduled appointment 2. please continue Augmentin for 2 weeks and discharged as prescribed 3. please have CT abdomen repeat as per scheduled appointment on 09/27/2013 at 10 am 4. please note that we have repleted potassium prior to discharge. Please continue taking potassium for 2 more days on discharge as prescribed  Discharge Diagnoses:  Active Problems:   Hypokalemia   Hypertension   Unspecified hypothyroidism   Sickle cell trait   Pericolonic abscess due to diverticulitis   Diverticulitis large intestine    Discharge Condition: medically stable for discharge home today  Diet recommendation: as tolerated   History of present illness:  68 year old female with past medical history of sickle cell trait, hypertension who presented to [facility] 09/11/2013 with ongoing abdominal pain, nausea and vomiting. She was subsequently found to have a diverticular abscess roughly an inch in diameter which managed by NPO and IV antibiotics (Initially flagyl and zosyn but now only on Zosyn) and per IR not for perc drainage as it is too small for drainage. Pt had ongoing complaints of pain and diet was NPO/ CLD and it was even thought about TPN if she continues to struggle with pain. This am she is better and tolerates regular diet.  Assessment/Plan:   Principal Problem:  Diverticulitis with 1.5 cm abscess  - diet advanced from clear liquids to full liquids to regular; tolerates well - on zosyn but being discharged with PO Augmentin  - appreciate GI and surgery following while pt in hospital  - CT abd to be repeated in about 1 week; i scheduled it for 01/14 Active Problems:  Hypokalemia  - repleted with PO potassium   Code Status: Full  Family Communication:  family at the bedside   Consultants:  Dr.Magod, Dr. Katherine Basset from [facility]  Dr. Autumn Messing, (surgery)   Antibiotics:  Zosyn 12/30>>  Flagyl 1/5>>.stopped 1/6    Signed:  Leisa Lenz, MD  [facility] 09/21/2013, 12:33 PM  Pager #: [phone]   Discharge Exam: Filed Vitals:   09/21/13 0535  BP: 142/82  Pulse: 80  Temp: 98.2 F (36.8 C)  Resp: 18   Filed Vitals:   09/20/13 0528 09/20/13 1400 09/20/13 2313 09/21/13 0535  BP: 134/73 129/72 151/87 142/82  Pulse: 65 81 69 80  Temp: 98.1 F (36.7 C) 98.1 F (36.7 C) 98.1 F (36.7 C) 98.2 F (36.8 C)  TempSrc: Oral Oral Oral Oral  Resp: 18 18 16 18   Height:      Weight:      SpO2: 99% 99% 97% 100%    General: Pt is alert, follows commands appropriately, not in acute distress Cardiovascular: Regular rate and rhythm, S1/S2 +, no murmurs, no rubs, no gallops Respiratory: Clear to auscultation bilaterally, no wheezing, no crackles, no rhonchi Abdominal: Soft, non tender, non distended, bowel sounds +, no guarding Extremities: no edema, no cyanosis, pulses palpable bilaterally DP and PT Neuro: Grossly nonfocal  Discharge Instructions  Discharge Orders   Future Appointments Provider Department Dept Phone   10/18/2013 10:00 AM Janith Lima, MD [place] [phone]   Future Orders Complete By Expires   Call MD for:  difficulty breathing, headache or visual disturbances  As directed    Call MD for:  persistant dizziness or light-headedness  As  directed    Call MD for:  persistant nausea and vomiting  As directed    Call MD for:  severe uncontrolled pain  As directed    Diet - low sodium heart healthy  As directed    Discharge instructions  As directed    Comments:     1. Please followup with GI per scheduled appointment 2. please continue Augmentin for 2 weeks and discharged as prescribed 3. please have CT abdomen repeat as per scheduled appointment on 09/27/2013 at 10 am 4.  please note that we have repleted potassium prior to discharge. Please continue taking potassium for 2 more days on discharge as prescribed   Increase activity slowly  As directed        Medication List         amoxicillin-clavulanate 875-125 MG per tablet  Commonly known as:  AUGMENTIN  Take 1 tablet by mouth 2 (two) times daily.     levothyroxine 125 MCG tablet  Commonly known as:  SYNTHROID, LEVOTHROID  Take 1 tablet (125 mcg total) by mouth daily.     ondansetron 4 MG tablet  Commonly known as:  ZOFRAN  Take 1 tablet (4 mg total) by mouth every 6 (six) hours as needed for nausea.     oxyCODONE-acetaminophen 5-325 MG per tablet  Commonly known as:  PERCOCET/ROXICET  Take 1 tablet by mouth every 4 (four) hours as needed for severe pain.     potassium chloride 10 MEQ tablet  Commonly known as:  K-DUR  Take 1 tablet (10 mEq total) by mouth 2 (two) times daily.           Follow-up Information   Follow up with EDWARDS JR,Kuehne L, MD. Schedule an appointment as soon as possible for a visit in 2 weeks.   Specialty:  Gastroenterology   Contact information:   [address] [place] [address] [phone]       Follow up with [facility] On 09/27/2013. (radiology dept; at 10 am; please call and verify)    Contact information:   [place] [id number] [phone]       The results of significant diagnostics from this hospitalization (including imaging, microbiology, ancillary and laboratory) are listed below for reference.    Significant Diagnostic Studies: Ct Abdomen Pelvis W Contrast  09/17/2013   CLINICAL DATA:  Followup evaluation for sigmoid diverticulitis and pericolonic abscess.  EXAM: CT ABDOMEN AND PELVIS WITH CONTRAST  TECHNIQUE: Multidetector CT imaging of the abdomen and pelvis was performed using the standard protocol following bolus administration of intravenous contrast.   CONTRAST:  68m OMNIPAQUE IOHEXOL 300 MG/ML SOLN, 1066mOMNIPAQUE IOHEXOL 300 MG/ML SOLN  COMPARISON:  CT of the abdomen and pelvis 09/11/2013.  FINDINGS: Lung Bases: Minimal subsegmental atelectasis in the lower lobes of the lungs bilaterally.  Abdomen/Pelvis: Status post cholecystectomy. Mild intrahepatic biliary ductal dilatation is unchanged compared to the prior study, and appears to be chronic in this patient. Common bile duct is slightly prominent measuring 10 mm in the porta hepatis (also unchanged). No focal hepatic lesions are identified on today's examination. Diffuse decreased attenuation throughout the hepatic parenchyma is suggestive of hepatic steatosis (although this is difficult to say for certain on a contrast-enhanced CT scan). The appearance of the pancreas, spleen and bilateral adrenal glands is  unremarkable. Numerous subcentimeter low attenuation lesions in the kidneys bilaterally are unchanged in size, number and pattern of distribution compared to the prior study, and although too small to definitively characterize are favored to represent small cysts.  Again noted are numerous colonic diverticulae, most severe in the region of the sigmoid colon. Immediately inferior to the distal sigmoid colon in the central pelvis, posterior to the uterine body, there is a well-defined 2.4 x 3.4 x 2.0 cm low-attenuation fluid collection with rim enhancement, compatible with a diverticular abscess. This appears slightly larger than the prior study. This is best demonstrated on axial image 75 of series 2, sagittal image 103 of series 6, and coronal image 129 of series 5. Surrounding inflammatory changes are again noted in the associated sigmoid mesocolon. No pneumoperitoneum to suggest frank perforation at this time. Trace volume of ascites. No pathologic distention of small bowel. No definite lymphadenopathy identified within the abdomen or pelvis. Uterus and bilateral ovaries are unremarkable in appearance.  Urinary bladder is normal in appearance.  Musculoskeletal: There are no aggressive appearing lytic or blastic lesions noted in the visualized portions of the skeleton.  IMPRESSION: 1. Persistent evidence of sigmoid diverticulitis with enlarging diverticular abscess, as discussed above. No signs of frank perforation at this time. 2. Status post cholecystectomy. Persistent mild dilatation of the common bile duct and mild intrahepatic biliary ductal dilatation, which appears to be chronic in this patient. Correlation with liver function tests is recommended. 3. Possible hepatic steatosis. 4. Additional incidental findings, as above.   Electronically Signed   By: Vinnie Langton M.D.   On: 09/17/2013 10:44   Ct Abdomen Pelvis W Contrast  09/12/2013   CLINICAL DATA:  Abdominal pain starting 2 days ago. Pain is in the epigastric region along the entire abdomen with tenderness. Vomiting 2 days ago. White cell count 15.5.  EXAM: CT ABDOMEN AND PELVIS WITH CONTRAST  TECHNIQUE: Multidetector CT imaging of the abdomen and pelvis was performed using the standard protocol following bolus administration of intravenous contrast.  CONTRAST:  21m OMNIPAQUE IOHEXOL 300 MG/ML SOLN, 1045mOMNIPAQUE IOHEXOL 300 MG/ML SOLN  COMPARISON:  None.  FINDINGS: Mild dependent changes in the lung bases. Small peripheral collapse. Surgical clip adjacent to the right cardiophrenic angle. Small esophageal hiatal hernia.  Surgical absence of the gallbladder. There is mild intra and extrahepatic bile duct dilatation. This may be physiologic in the setting of previous cholecystectomy. No obstructing stone or mass is visualized. Non radiopaque common duct stone is not excluded. Consider further evaluation with liver function studies and if positive, consider MRCP in the elective setting. Diffuse fatty infiltration of the liver without focal liver lesion. Spleen size is normal. The pancreas, adrenal glands, abdominal aorta, inferior vena cava, and  retroperitoneal lymph nodes are unremarkable. Multiple small subcentimeter cysts in the kidneys. No solid mass or hydronephrosis demonstrated. The stomach, small bowel, and colon are not abnormally distended. No free air or free fluid in the abdomen. Small umbilical hernia containing fat.  Pelvis: Uterus and ovaries are not enlarged. Diverticulosis of the sigmoid colon with infiltration around the colon and with small fluid collections inferior to the colon and in the perirectal space. Changes are consistent with acute diverticulitis. Early abscess measuring 1.5 cm diameter is suggested. The appendix is not visualized. Bladder wall is not thickened. No significant pelvic lymphadenopathy. The degenerative changes in the lumbar spine. No destructive bone lesions appreciated.  IMPRESSION: Diverticulosis with acute diverticulitis in the sigmoid colon. Pericolonic infiltration with small abscess  measuring 1.5 cm diameter.   Electronically Signed   By: Lucienne Capers M.D.   On: 09/12/2013 00:17   Ir Fluoro Guide Cv Line Right  09/19/2013   CLINICAL DATA:  68 year old female with a clinical history of pneumonia in need durable central venous access for outpatient IV antibiotic therapy.  EXAM: IR RIGHT FLOURO GUIDE CV LINE; IR ULTRASOUND GUIDANCE VASC ACCESS RIGHT  TECHNIQUE: The right arm was prepped with chlorhexidine, draped in the usual sterile fashion using maximum barrier technique (cap and mask, sterile gown, sterile gloves, large sterile sheet, hand hygiene and cutaneous antiseptic). Local anesthesia was attained by infiltration with 1% lidocaine.  Ultrasound demonstrated patency of the right brachial vein, and this was documented with an image. Under real-time ultrasound guidance, this vein was accessed with a 21 gauge micropuncture needle and image documentation was performed. The needle was exchanged over a guidewire for a peel-away sheath through which a 36 cm 5 [place] dual lumen Power injectable PICC was  advanced, and positioned with its tip at the lower SVC/right atrial junction. Fluoroscopy during the procedure and fluoro spot radiograph confirms appropriate catheter position. The catheter was flushed, secured to the skin with Prolene sutures, and covered with a sterile dressing.  FLUOROSCOPY TIME:  12 seconds  COMPLICATIONS: None.  The patient tolerated the procedure well.  IMPRESSION: Successful placement of a right Power PICC with sonographic and fluoroscopic guidance. The catheter is ready for use.  Signed,  Criselda Peaches, MD  [facility]  [facility]   Electronically Signed   By: Jacqulynn Cadet M.D.   On: 09/19/2013 17:07   Ir US Guide Vasc Access Right  09/19/2013   CLINICAL DATA:  68 year old female with a clinical history of pneumonia in need durable central venous access for outpatient IV antibiotic therapy.  EXAM: IR RIGHT FLOURO GUIDE CV LINE; IR ULTRASOUND GUIDANCE VASC ACCESS RIGHT  TECHNIQUE: The right arm was prepped with chlorhexidine, draped in the usual sterile fashion using maximum barrier technique (cap and mask, sterile gown, sterile gloves, large sterile sheet, hand hygiene and cutaneous antiseptic). Local anesthesia was attained by infiltration with 1% lidocaine.  Ultrasound demonstrated patency of the right brachial vein, and this was documented with an image. Under real-time ultrasound guidance, this vein was accessed with a 21 gauge micropuncture needle and image documentation was performed. The needle was exchanged over a guidewire for a peel-away sheath through which a 36 cm 5 [place] dual lumen Power injectable PICC was advanced, and positioned with its tip at the lower SVC/right atrial junction. Fluoroscopy during the procedure and fluoro spot radiograph confirms appropriate catheter position. The catheter was flushed, secured to the skin with Prolene sutures, and covered with a sterile dressing.  FLUOROSCOPY TIME:  12 seconds   COMPLICATIONS: None.  The patient tolerated the procedure well.  IMPRESSION: Successful placement of a right Power PICC with sonographic and fluoroscopic guidance. The catheter is ready for use.  Signed,  Criselda Peaches, MD  [facility]  [facility]   Electronically Signed   By: Jacqulynn Cadet M.D.   On: 09/19/2013 17:07    Microbiology: No results found for this or any previous visit (from the past 240 hour(s)).   Labs: Basic Metabolic Panel:  Recent Labs Lab 09/17/13 0508 09/18/13 0512 09/18/13 1501 09/19/13 0510 09/20/13 0550 09/21/13 0600  NA 142 142  --  143 144 142  K 3.2* 3.1* 2.9* 3.4* 3.1* 3.2*  CL  104 103  --  105 106 105  CO2 24 23  --  27 25 25   GLUCOSE 87 81  --  105* 94 103*  BUN 6 7  --  4* 4* <3*  CREATININE 1.05 1.00  --  1.08 1.00 0.97  CALCIUM 9.3 9.3  --  9.4 9.2 9.4  MG 2.0 2.0  --  2.0 1.9 1.9   Liver Function Tests:  Recent Labs Lab 09/17/13 0508 09/18/13 0512 09/19/13 0510 09/20/13 0550 09/21/13 0600  AST 23 17 15 14 13   ALT 63* 46* 36* 27 22  ALKPHOS 146* 133* 120* 99 96  BILITOT 0.9 0.9 0.9 0.7 0.6  PROT 6.7 6.8 7.1 6.5 6.7  ALBUMIN 2.7* 2.7* 2.9* 2.8* 2.9*   No results found for this basename: LIPASE, AMYLASE,  in the last 168 hours No results found for this basename: AMMONIA,  in the last 168 hours CBC:  Recent Labs Lab 09/17/13 0508 09/18/13 0512 09/19/13 0510 09/20/13 0550 09/21/13 0600  WBC 8.2 9.9 10.1 8.2 10.7*  NEUTROABS 5.6 7.4 7.0 5.6 7.5  HGB 10.4* 10.9* 11.6* 10.5* 10.8*  HCT 31.0* 32.2* 33.6* 30.9* 31.8*  MCV 84.7 84.7 84.6 84.7 85.7  PLT 238 238 280 243 251   Cardiac Enzymes: No results found for this basename: CKTOTAL, CKMB, CKMBINDEX, TROPONINI,  in the last 168 hours BNP: BNP (last 3 results)  Recent Labs  02/20/13 1600  PROBNP 9.6   CBG: No results found for this basename: GLUCAP,  in the last 168 hours  Time coordinating discharge: Over 30  minutes

## 2013-09-21 NOTE — Progress Notes (Signed)
[facility] PROGRESS NOTE Subjective Pt tolerating FLs w/o much pain c/o "gas"  Objective: Vital signs in last 24 hours: Temp:  [98.1 F (36.7 C)-98.2 F (36.8 C)] 98.2 F (36.8 C) (01/08 0535) Pulse Rate:  [69-81] 80 (01/08 0535) Resp:  [16-18] 18 (01/08 0535) BP: (129-151)/(72-87) 142/82 mmHg (01/08 0535) SpO2:  [97 %-100 %] 100 % (01/08 0535) Last BM Date: 09/20/13  Intake/Output from previous day: 01/07 0701 - 01/08 0700 In: 2973.3 [P.O.:480; I.V.:2373.3; IV Piggyback:120] Out: -  Intake/Output this shift:    PE: General--alert laughing  Abdomen--minimal tender nondistended  Lab Results:  Recent Labs  09/19/13 0510 09/20/13 0550 09/21/13 0600  WBC 10.1 8.2 10.7*  HGB 11.6* 10.5* 10.8*  HCT 33.6* 30.9* 31.8*  PLT 280 243 251   BMET  Recent Labs  09/18/13 1501 09/19/13 0510 09/20/13 0550 09/21/13 0600  NA  --  143 144 142  K 2.9* 3.4* 3.1* 3.2*  CL  --  105 106 105  CO2  --  27 25 25   CREATININE  --  1.08 1.00 0.97   LFT  Recent Labs  09/19/13 0510 09/20/13 0550 09/21/13 0600  PROT 7.1 6.5 6.7  AST 15 14 13   ALT 36* 27 22  ALKPHOS 120* 99 96  BILITOT 0.9 0.7 0.6   PT/INR No results found for this basename: LABPROT, INR,  in the last 72 hours PANCREAS No results found for this basename: LIPASE,  in the last 72 hours       Studies/Results: Ir Fluoro Guide Cv Line Right  09/19/2013   CLINICAL DATA:  68 year old female with a clinical history of pneumonia in need durable central venous access for outpatient IV antibiotic therapy.  EXAM: IR RIGHT FLOURO GUIDE CV LINE; IR ULTRASOUND GUIDANCE VASC ACCESS RIGHT  TECHNIQUE: The right arm was prepped with chlorhexidine, draped in the usual sterile fashion using maximum barrier technique (cap and mask, sterile gown, sterile gloves, large sterile sheet, hand hygiene and cutaneous antiseptic). Local anesthesia was attained by infiltration with 1% lidocaine.  Ultrasound demonstrated patency  of the right brachial vein, and this was documented with an image. Under real-time ultrasound guidance, this vein was accessed with a 21 gauge micropuncture needle and image documentation was performed. The needle was exchanged over a guidewire for a peel-away sheath through which a 36 cm 5 [place] dual lumen Power injectable PICC was advanced, and positioned with its tip at the lower SVC/right atrial junction. Fluoroscopy during the procedure and fluoro spot radiograph confirms appropriate catheter position. The catheter was flushed, secured to the skin with Prolene sutures, and covered with a sterile dressing.  FLUOROSCOPY TIME:  12 seconds  COMPLICATIONS: None.  The patient tolerated the procedure well.  IMPRESSION: Successful placement of a right Power PICC with sonographic and fluoroscopic guidance. The catheter is ready for use.  Signed,  Criselda Peaches, MD  [facility]  [facility]   Electronically Signed   By: Jacqulynn Cadet M.D.   On: 09/19/2013 17:07   Ir US Guide Vasc Access Right  09/19/2013   CLINICAL DATA:  68 year old female with a clinical history of pneumonia in need durable central venous access for outpatient IV antibiotic therapy.  EXAM: IR RIGHT FLOURO GUIDE CV LINE; IR ULTRASOUND GUIDANCE VASC ACCESS RIGHT  TECHNIQUE: The right arm was prepped with chlorhexidine, draped in the usual sterile fashion using maximum barrier technique (cap and mask, sterile gown, sterile gloves, large sterile sheet, hand hygiene and  cutaneous antiseptic). Local anesthesia was attained by infiltration with 1% lidocaine.  Ultrasound demonstrated patency of the right brachial vein, and this was documented with an image. Under real-time ultrasound guidance, this vein was accessed with a 21 gauge micropuncture needle and image documentation was performed. The needle was exchanged over a guidewire for a peel-away sheath through which a 36 cm 5 [place] dual lumen Power  injectable PICC was advanced, and positioned with its tip at the lower SVC/right atrial junction. Fluoroscopy during the procedure and fluoro spot radiograph confirms appropriate catheter position. The catheter was flushed, secured to the skin with Prolene sutures, and covered with a sterile dressing.  FLUOROSCOPY TIME:  12 seconds  COMPLICATIONS: None.  The patient tolerated the procedure well.  IMPRESSION: Successful placement of a right Power PICC with sonographic and fluoroscopic guidance. The catheter is ready for use.  Signed,  Criselda Peaches, MD  [facility]  [facility]   Electronically Signed   By: Jacqulynn Cadet M.D.   On: 09/19/2013 17:07    Medications: I have reviewed the patient's current medications.  Assessment/Plan: 1. Diverticulitis with small abscess.  Seems to be doing ok. Would send home on 2 weeks augmentin and miralax to keep stools soft and low residue diet and f/u in office with me in about 2 weeks.   Hildred Mollica JR,Weismann L 09/21/2013, 8:48 AM

## 2013-09-21 NOTE — Progress Notes (Signed)
Patient ID: Valerie Jackson, female   DOB: 11/04/1945, 68 y.o.   MRN: [id number]  General Surgery - [facility] - Progress Note  Subjective: Patient pleasant, no complaints, looking forward to "real food" for breakfast.  No nausea or emesis.  No abdominal pain.  Objective: Vital signs in last 24 hours: Temp:  [98.1 F (36.7 C)-98.2 F (36.8 C)] 98.2 F (36.8 C) (01/08 0535) Pulse Rate:  [69-81] 80 (01/08 0535) Resp:  [16-18] 18 (01/08 0535) BP: (129-151)/(72-87) 142/82 mmHg (01/08 0535) SpO2:  [97 %-100 %] 100 % (01/08 0535) Last BM Date: 09/20/13  Intake/Output from previous day: 01/07 0701 - 01/08 0700 In: 2973.3 [P.O.:480; I.V.:2373.3; IV Piggyback:120] Out: -   Exam: HEENT - clear, not icteric Neck - soft Abd - soft without distension; BS present; no tenderness; no mass Ext - no significant edema Neuro - grossly intact, no focal deficits  Lab Results:   Recent Labs  09/20/13 0550 09/21/13 0600  WBC 8.2 10.7*  HGB 10.5* 10.8*  HCT 30.9* 31.8*  PLT 243 251     Recent Labs  09/20/13 0550 09/21/13 0600  NA 144 142  K 3.1* 3.2*  CL 106 105  CO2 25 25  GLUCOSE 94 103*  BUN 4* <3*  CREATININE 1.00 0.97  CALCIUM 9.2 9.4    Studies/Results: No results found.  Assessment / Plan: 1.  Acute diverticulitis with small abscess  Improving on IV abx  Diet advanced per GI  Anticipating discharge home later today  Antibiotics for next 2 weeks per GI and medical service - po or IV per medical team  Will arrange follow up at [place] office in 3-4 weeks with Dr. Autumn Messing  Will sign off - call if surgical issue arises  Earnstine Regal, MD, [facility] Office: [phone]  09/21/2013

## 2013-09-25 ENCOUNTER — Other Ambulatory Visit: Payer: Self-pay | Admitting: Internal Medicine

## 2013-09-25 DIAGNOSIS — R7989 Other specified abnormal findings of blood chemistry: Secondary | ICD-10-CM

## 2013-09-25 DIAGNOSIS — K5732 Diverticulitis of large intestine without perforation or abscess without bleeding: Secondary | ICD-10-CM

## 2013-09-27 ENCOUNTER — Encounter: Payer: Self-pay | Admitting: Internal Medicine

## 2013-09-27 ENCOUNTER — Telehealth (INDEPENDENT_AMBULATORY_CARE_PROVIDER_SITE_OTHER): Payer: Self-pay

## 2013-09-27 ENCOUNTER — Ambulatory Visit (HOSPITAL_COMMUNITY)
Admission: RE | Admit: 2013-09-27 | Discharge: 2013-09-27 | Disposition: A | Discharge status: Home / Self Care | Payer: Medicare Other | Source: Ambulatory Visit | Attending: Internal Medicine | Admitting: Internal Medicine

## 2013-09-27 DIAGNOSIS — K632 Fistula of intestine: Secondary | ICD-10-CM | POA: Insufficient documentation

## 2013-09-27 DIAGNOSIS — E7800 Pure hypercholesterolemia, unspecified: Secondary | ICD-10-CM | POA: Diagnosis present

## 2013-09-27 DIAGNOSIS — Z8249 Family history of ischemic heart disease and other diseases of the circulatory system: Secondary | ICD-10-CM | POA: Diagnosis not present

## 2013-09-27 DIAGNOSIS — K7689 Other specified diseases of liver: Secondary | ICD-10-CM

## 2013-09-27 DIAGNOSIS — K838 Other specified diseases of biliary tract: Secondary | ICD-10-CM | POA: Insufficient documentation

## 2013-09-27 DIAGNOSIS — F329 Major depressive disorder, single episode, unspecified: Secondary | ICD-10-CM | POA: Diagnosis present

## 2013-09-27 DIAGNOSIS — Z86718 Personal history of other venous thrombosis and embolism: Secondary | ICD-10-CM | POA: Diagnosis not present

## 2013-09-27 DIAGNOSIS — Z6829 Body mass index (BMI) 29.0-29.9, adult: Secondary | ICD-10-CM | POA: Diagnosis not present

## 2013-09-27 DIAGNOSIS — K651 Peritoneal abscess: Secondary | ICD-10-CM

## 2013-09-27 DIAGNOSIS — K630 Abscess of intestine: Secondary | ICD-10-CM | POA: Diagnosis not present

## 2013-09-27 DIAGNOSIS — F3289 Other specified depressive episodes: Secondary | ICD-10-CM | POA: Diagnosis present

## 2013-09-27 DIAGNOSIS — N731 Chronic parametritis and pelvic cellulitis: Secondary | ICD-10-CM | POA: Diagnosis not present

## 2013-09-27 DIAGNOSIS — F17200 Nicotine dependence, unspecified, uncomplicated: Secondary | ICD-10-CM | POA: Diagnosis present

## 2013-09-27 DIAGNOSIS — Z823 Family history of stroke: Secondary | ICD-10-CM | POA: Diagnosis not present

## 2013-09-27 DIAGNOSIS — D573 Sickle-cell trait: Secondary | ICD-10-CM | POA: Diagnosis not present

## 2013-09-27 DIAGNOSIS — K5732 Diverticulitis of large intestine without perforation or abscess without bleeding: Secondary | ICD-10-CM

## 2013-09-27 DIAGNOSIS — I10 Essential (primary) hypertension: Secondary | ICD-10-CM | POA: Diagnosis not present

## 2013-09-27 DIAGNOSIS — D739 Disease of spleen, unspecified: Secondary | ICD-10-CM

## 2013-09-27 DIAGNOSIS — E785 Hyperlipidemia, unspecified: Secondary | ICD-10-CM | POA: Diagnosis present

## 2013-09-27 DIAGNOSIS — Z803 Family history of malignant neoplasm of breast: Secondary | ICD-10-CM | POA: Diagnosis not present

## 2013-09-27 DIAGNOSIS — K219 Gastro-esophageal reflux disease without esophagitis: Secondary | ICD-10-CM | POA: Diagnosis not present

## 2013-09-27 DIAGNOSIS — K5730 Diverticulosis of large intestine without perforation or abscess without bleeding: Secondary | ICD-10-CM | POA: Diagnosis not present

## 2013-09-27 DIAGNOSIS — N281 Cyst of kidney, acquired: Secondary | ICD-10-CM

## 2013-09-27 DIAGNOSIS — E039 Hypothyroidism, unspecified: Secondary | ICD-10-CM | POA: Diagnosis not present

## 2013-09-27 DIAGNOSIS — E669 Obesity, unspecified: Secondary | ICD-10-CM | POA: Diagnosis present

## 2013-09-27 DIAGNOSIS — Z808 Family history of malignant neoplasm of other organs or systems: Secondary | ICD-10-CM | POA: Diagnosis not present

## 2013-09-27 DIAGNOSIS — Z79899 Other long term (current) drug therapy: Secondary | ICD-10-CM | POA: Diagnosis not present

## 2013-09-27 DIAGNOSIS — M62838 Other muscle spasm: Secondary | ICD-10-CM | POA: Diagnosis not present

## 2013-09-27 MED ORDER — IOHEXOL 300 MG/ML  SOLN
100.0000 mL | Freq: Once | INTRAMUSCULAR | Status: AC | PRN
Start: 2013-09-27 — End: 2013-09-27
  Administered 2013-09-27: 100 mL via INTRAVENOUS

## 2013-09-27 MED ORDER — IOHEXOL 300 MG/ML  SOLN
50.0000 mL | Freq: Once | INTRAMUSCULAR | Status: AC | PRN
  Administered 2013-09-27: 50 mL via ORAL

## 2013-09-27 NOTE — Progress Notes (Unsigned)
Patient ID: Valerie Jackson, female   DOB: Jun 26, 1946, 68 y.o.   MRN: [id number] I got a phone call from radiology department in regards to CT abdomen/pelvis for [facility]. I have reviewed the results and it looks like she has an enlarging abscess from last imaging study about one week ago. I called interventional radiology and recommendation was to see if surgery can intervene first. If not, then most likely she will need drainage. I paged surgery on-call, waiting for response. Leisa Lenz [facility] [phone]

## 2013-09-27 NOTE — Telephone Encounter (Signed)
Dr. Charlies Silvers Patient is to be seen for peri abscess, Dr. Marlou Starks advise her to call office for urg. Appt.  Patient was called which stated she could not be here in the next 10-15 minutes, due to DR. Christiana Pellant. Ov being done for the day but she could be here tomorrow. Appt scheduled 09-28-12@3p  patient aware.

## 2013-09-28 ENCOUNTER — Encounter (HOSPITAL_COMMUNITY): Payer: Self-pay | Admitting: *Deleted

## 2013-09-28 ENCOUNTER — Encounter (INDEPENDENT_AMBULATORY_CARE_PROVIDER_SITE_OTHER): Payer: Self-pay | Admitting: Surgery

## 2013-09-28 ENCOUNTER — Inpatient Hospital Stay (HOSPITAL_COMMUNITY)
Admission: AD | Admit: 2013-09-28 | Discharge: 2013-10-17 | DRG: 330 | Disposition: A | Discharge status: Home Health | Payer: Medicare Other | Source: Ambulatory Visit | Attending: General Surgery | Admitting: General Surgery

## 2013-09-28 ENCOUNTER — Ambulatory Visit (INDEPENDENT_AMBULATORY_CARE_PROVIDER_SITE_OTHER): Payer: Medicare Other | Admitting: Surgery

## 2013-09-28 VITALS — BP 116/80 | HR 66 | Temp 97.5°F | Resp 16 | Ht 66.0 in | Wt 181.6 lb

## 2013-09-28 DIAGNOSIS — Z86718 Personal history of other venous thrombosis and embolism: Secondary | ICD-10-CM

## 2013-09-28 DIAGNOSIS — E7800 Pure hypercholesterolemia, unspecified: Secondary | ICD-10-CM | POA: Diagnosis present

## 2013-09-28 DIAGNOSIS — Z79899 Other long term (current) drug therapy: Secondary | ICD-10-CM

## 2013-09-28 DIAGNOSIS — K5720 Diverticulitis of large intestine with perforation and abscess without bleeding: Secondary | ICD-10-CM

## 2013-09-28 DIAGNOSIS — K219 Gastro-esophageal reflux disease without esophagitis: Secondary | ICD-10-CM | POA: Diagnosis present

## 2013-09-28 DIAGNOSIS — F17200 Nicotine dependence, unspecified, uncomplicated: Secondary | ICD-10-CM | POA: Diagnosis present

## 2013-09-28 DIAGNOSIS — E785 Hyperlipidemia, unspecified: Secondary | ICD-10-CM | POA: Diagnosis present

## 2013-09-28 DIAGNOSIS — Z803 Family history of malignant neoplasm of breast: Secondary | ICD-10-CM

## 2013-09-28 DIAGNOSIS — M62838 Other muscle spasm: Secondary | ICD-10-CM | POA: Diagnosis not present

## 2013-09-28 DIAGNOSIS — I10 Essential (primary) hypertension: Secondary | ICD-10-CM | POA: Diagnosis present

## 2013-09-28 DIAGNOSIS — E669 Obesity, unspecified: Secondary | ICD-10-CM | POA: Diagnosis present

## 2013-09-28 DIAGNOSIS — F3289 Other specified depressive episodes: Secondary | ICD-10-CM | POA: Diagnosis present

## 2013-09-28 DIAGNOSIS — K5732 Diverticulitis of large intestine without perforation or abscess without bleeding: Secondary | ICD-10-CM

## 2013-09-28 DIAGNOSIS — F329 Major depressive disorder, single episode, unspecified: Secondary | ICD-10-CM | POA: Diagnosis present

## 2013-09-28 DIAGNOSIS — K630 Abscess of intestine: Secondary | ICD-10-CM | POA: Diagnosis present

## 2013-09-28 DIAGNOSIS — Z823 Family history of stroke: Secondary | ICD-10-CM

## 2013-09-28 DIAGNOSIS — Z6829 Body mass index (BMI) 29.0-29.9, adult: Secondary | ICD-10-CM

## 2013-09-28 DIAGNOSIS — Z8249 Family history of ischemic heart disease and other diseases of the circulatory system: Secondary | ICD-10-CM

## 2013-09-28 DIAGNOSIS — D573 Sickle-cell trait: Secondary | ICD-10-CM | POA: Diagnosis present

## 2013-09-28 DIAGNOSIS — Z808 Family history of malignant neoplasm of other organs or systems: Secondary | ICD-10-CM

## 2013-09-28 DIAGNOSIS — E039 Hypothyroidism, unspecified: Secondary | ICD-10-CM | POA: Diagnosis present

## 2013-09-28 LAB — CBC WITH DIFFERENTIAL/PLATELET
Basophils Absolute: 0 10*3/uL (ref 0.0–0.1)
Basophils Relative: 0 % (ref 0–1)
Eosinophils Absolute: 0.3 10*3/uL (ref 0.0–0.7)
Eosinophils Relative: 3 % (ref 0–5)
HCT: 31.8 % — ABNORMAL LOW (ref 36.0–46.0)
Hemoglobin: 10.8 g/dL — ABNORMAL LOW (ref 12.0–15.0)
Lymphocytes Relative: 16 % (ref 12–46)
Lymphs Abs: 1.7 10*3/uL (ref 0.7–4.0)
MCH: 28.9 pg (ref 26.0–34.0)
MCHC: 34 g/dL (ref 30.0–36.0)
MCV: 85 fL (ref 78.0–100.0)
Monocytes Absolute: 0.6 10*3/uL (ref 0.1–1.0)
Monocytes Relative: 6 % (ref 3–12)
Neutro Abs: 7.7 10*3/uL (ref 1.7–7.7)
Neutrophils Relative %: 75 % (ref 43–77)
Platelets: 338 10*3/uL (ref 150–400)
RBC: 3.74 MIL/uL — ABNORMAL LOW (ref 3.87–5.11)
RDW: 13.3 % (ref 11.5–15.5)
WBC: 10.4 10*3/uL (ref 4.0–10.5)

## 2013-09-28 LAB — BASIC METABOLIC PANEL
BUN: 10 mg/dL (ref 6–23)
CO2: 25 mEq/L (ref 19–32)
Calcium: 9.6 mg/dL (ref 8.4–10.5)
Chloride: 100 mEq/L (ref 96–112)
Creatinine, Ser: 1.02 mg/dL (ref 0.50–1.10)
GFR calc Af Amer: 64 mL/min — ABNORMAL LOW (ref >90)
GFR calc non Af Amer: 55 mL/min — ABNORMAL LOW (ref >90)
Glucose, Bld: 109 mg/dL — ABNORMAL HIGH (ref 70–99)
Potassium: 3.5 mEq/L — ABNORMAL LOW (ref 3.7–5.3)
Sodium: 139 mEq/L (ref 137–147)

## 2013-09-28 LAB — PREALBUMIN: Prealbumin: 15.4 mg/dL — ABNORMAL LOW (ref 17.0–34.0)

## 2013-09-28 MED ORDER — PIPERACILLIN-TAZOBACTAM 3.375 G IVPB
3.3750 g | Freq: Three times a day (TID) | INTRAVENOUS | Status: DC
  Administered 2013-09-29 – 2013-10-04 (×17): 3.375 g via INTRAVENOUS
  Filled 2013-09-28 (×18): qty 50

## 2013-09-28 MED ORDER — ONDANSETRON HCL 4 MG/2ML IJ SOLN
4.0000 mg | Freq: Four times a day (QID) | INTRAMUSCULAR | Status: DC | PRN
  Administered 2013-10-10 – 2013-10-11 (×3): 4 mg via INTRAVENOUS
  Filled 2013-09-28 (×3): qty 2

## 2013-09-28 MED ORDER — PIPERACILLIN-TAZOBACTAM 3.375 G IVPB
3.3750 g | Freq: Once | INTRAVENOUS | Status: AC
  Administered 2013-09-28: 3.375 g via INTRAVENOUS
  Filled 2013-09-28: qty 50

## 2013-09-28 MED ORDER — ACETAMINOPHEN 325 MG PO TABS: 650.0000 mg | ORAL_TABLET | Freq: Four times a day (QID) | ORAL | Status: DC | PRN

## 2013-09-28 MED ORDER — HYDROMORPHONE HCL PF 1 MG/ML IJ SOLN
1.0000 mg | INTRAMUSCULAR | Status: DC | PRN
  Administered 2013-10-11 – 2013-10-12 (×7): 1 mg via INTRAVENOUS
  Filled 2013-09-28 (×8): qty 1

## 2013-09-28 MED ORDER — KCL IN DEXTROSE-NACL 20-5-0.45 MEQ/L-%-% IV SOLN
INTRAVENOUS | Status: DC
  Administered 2013-09-28: 18:00:00 via INTRAVENOUS
  Filled 2013-09-28 (×2): qty 1000

## 2013-09-28 MED ORDER — HYDROCODONE-ACETAMINOPHEN 5-325 MG PO TABS
1.0000 | ORAL_TABLET | ORAL | Status: DC | PRN
  Administered 2013-09-28 – 2013-10-15 (×9): 1 via ORAL
  Administered 2013-10-15: 2 via ORAL
  Administered 2013-10-16: 1 via ORAL
  Filled 2013-09-28 (×3): qty 1
  Filled 2013-09-28: qty 2
  Filled 2013-09-28: qty 1
  Filled 2013-09-28 (×2): qty 2
  Filled 2013-09-28 (×2): qty 1
  Filled 2013-09-28: qty 2
  Filled 2013-09-28 (×2): qty 1

## 2013-09-28 MED ORDER — ACETAMINOPHEN 650 MG RE SUPP: 650.0000 mg | Freq: Four times a day (QID) | RECTAL | Status: DC | PRN

## 2013-09-28 MED ORDER — FLUCONAZOLE 150 MG PO TABS
150.0000 mg | ORAL_TABLET | Freq: Once | ORAL | Status: AC
  Administered 2013-09-28: 150 mg via ORAL
  Filled 2013-09-28: qty 1

## 2013-09-28 NOTE — Progress Notes (Signed)
General Surgery [facility]  Chief Complaint  Patient presents with  . Follow-up    Diverticular disease with abscess - referral from Dr. Leisa Lenz    HISTORY: Patient is a 68 year old female recently hospitalized with acute diverticulitis with abscess formation. Patient was treated with bowel rest and intravenous antibiotics. She was discharged home 1 week ago on oral Augmentin.  Patient has continued to have lower abdominal pain. She is eating a very limited diet. She is having loose bowel movements. Patient underwent a CT scan of the abdomen and pelvis on 09/27/2013. This shows persistent acute diverticulitis involving the sigmoid colon. There is a fistula present and a complex abscess in the pelvis measuring 4.8 cm in size, increased from her prior study. Patient presents today for evaluation on recommendation from her medical doctor.  Past Medical History  Diagnosis Date  . Hypertension   . Hypothyroid   . Depression   . Hypercholesteremia   . Sickle cell trait   . Embolus   . GERD (gastroesophageal reflux disease)   . History of blood transfusion     Current Outpatient Prescriptions  Medication Sig Dispense Refill  . amoxicillin-clavulanate (AUGMENTIN) 875-125 MG per tablet Take 1 tablet by mouth 2 (two) times daily.  28 tablet  0  . levothyroxine (SYNTHROID, LEVOTHROID) 125 MCG tablet Take 1 tablet (125 mcg total) by mouth daily.  90 tablet  1  . ondansetron (ZOFRAN) 4 MG tablet Take 1 tablet (4 mg total) by mouth every 6 (six) hours as needed for nausea.  45 tablet  0  . oxyCODONE-acetaminophen (PERCOCET/ROXICET) 5-325 MG per tablet Take 1 tablet by mouth every 4 (four) hours as needed for severe pain.  60 tablet  0  . Polyethylene Glycol 3350 (MIRALAX PO) Take by mouth.      . potassium chloride (K-DUR) 10 MEQ tablet Take 1 tablet (10 mEq total) by mouth 2 (two) times daily.  4 tablet  0   No current facility-administered medications for this visit.     Allergies  Allergen Reactions  . Estrogens     Blood clot in leg    Family History  Problem Relation Age of Onset  . Hypertension Mother   . Aneurysm Mother   . Cancer Mother     Uterine  . Thyroid cancer Mother   . Cancer Other   . Thyroid cancer Sister   . Arthritis Sister   . Hyperlipidemia Sister   . Hypertension Sister   . Stroke Sister   . Kidney disease Sister   . Hyperlipidemia Brother   . Thyroid cancer Daughter   . Breast cancer Maternal Grandmother   . Hypertension Maternal Grandmother     History   Social History  . Marital Status: Divorced    Spouse Name: N/A    Number of Children: 1  . Years of Education: 14   Occupational History  . Retired     Therapist, sports   Social History Main Topics  . Smoking status: Current Every Day Smoker -- 0.25 packs/day for 25 years    Types: Cigarettes  . Smokeless tobacco: Never Used  . Alcohol Use: No  . Drug Use: No  . Sexual Activity: Yes   Other Topics Concern  . None   Social History Narrative   Regular exercise-no   Caffeine Use-yes    REVIEW OF SYSTEMS - PERTINENT POSITIVES ONLY: Persistent lower abdominal pain greater on the right than the left. Anorexia.  EXAM: Filed Vitals:  09/28/13 1514  BP: 116/80  Pulse: 66  Temp: 97.5 F (36.4 C)  Resp: 16    GENERAL: well-developed, well-nourished, mild discomfort HEENT: normocephalic; pupils equal and reactive; sclerae clear; dentition good; mucous membranes moist NECK:  symmetric on extension; no palpable anterior or posterior cervical lymphadenopathy; no supraclavicular masses; no tenderness CHEST: clear to auscultation bilaterally without rales, rhonchi, or wheezes CARDIAC: regular rate and rhythm without significant murmur; peripheral pulses are full ABDOMEN: soft without distension; bowel sounds present; no mass; no hepatosplenomegaly; tenderness to palpation bilateral lower quadrants, maximum tenderness in the suprapubic region with voluntary  guarding; mild rebound tenderness EXT:  non-tender without edema; no deformity NEURO: no gross focal deficits; no sign of tremor   LABORATORY RESULTS: See [facility] (CHL-Epic) for most recent results  RADIOLOGY RESULTS: See [facility] (CHL-Epic) for most recent results  IMPRESSION: #1 acute diverticulitis #2 pelvic abscess secondary to acute diverticulitis  PLAN: I discussed the findings on yesterday's CT scan with the patient and her daughter. We reviewed the written report and the images from her scan. This clearly shows a worsening acute inflammatory process despite oral antibiotic therapy and low-residue diet.  I have recommended readmission to the hospital. We will start her on intravenous antibiotics. We will schedule her for evaluation by interventional radiology for possible percutaneous drainage. If percutaneous drainage is not successful, she will require laparotomy with resection and probable colostomy placement. Patient and her daughter understand and agree to proceed.  I will notify Dr. Jackolyn Confer who is managing the general surgery service at the hospital.  Earnstine Regal, MD, [facility]  Primary Care Physician: [name], MD

## 2013-09-28 NOTE — H&P (Signed)
Valerie Regal, MD Physician Signed  Progress Notes Service date: 09/28/2013 3:39 PM  Related encounter: Office Visit from 09/28/2013 in [facility]    Chief Complaint   Patient presents with   .  Follow-up       Diverticular disease with abscess - referral from Dr. Leisa Lenz      HISTORY: Patient is a 68 year old female recently hospitalized with acute diverticulitis with abscess formation. Patient was treated with bowel rest and intravenous antibiotics. She was discharged home 1 week ago on oral Augmentin.   Patient has continued to have lower abdominal pain. She is eating a very limited diet. She is having loose bowel movements. Patient underwent a CT scan of the abdomen and pelvis on 09/27/2013. This shows persistent acute diverticulitis involving the sigmoid colon. There is a fistula present and a complex abscess in the pelvis measuring 4.8 cm in size, increased from her prior study. Patient presents today for evaluation on recommendation from her medical doctor.    Past Medical History   Diagnosis  Date   .  Hypertension     .  Hypothyroid     .  Depression     .  Hypercholesteremia     .  Sickle cell trait     .  Embolus     .  GERD (gastroesophageal reflux disease)     .  History of blood transfusion         Current Outpatient Prescriptions   Medication  Sig  Dispense  Refill   .  amoxicillin-clavulanate (AUGMENTIN) 875-125 MG per tablet  Take 1 tablet by mouth 2 (two) times daily.   28 tablet   0   .  levothyroxine (SYNTHROID, LEVOTHROID) 125 MCG tablet  Take 1 tablet (125 mcg total) by mouth daily.   90 tablet   1   .  ondansetron (ZOFRAN) 4 MG tablet  Take 1 tablet (4 mg total) by mouth every 6 (six) hours as needed for nausea.   45 tablet   0   .  oxyCODONE-acetaminophen (PERCOCET/ROXICET) 5-325 MG per tablet  Take 1 tablet by mouth every 4 (four) hours as needed for severe pain.   60 tablet   0   .   Polyethylene Glycol 3350 (MIRALAX PO)  Take by mouth.         .  potassium chloride (K-DUR) 10 MEQ tablet  Take 1 tablet (10 mEq total) by mouth 2 (two) times daily.   4 tablet   0       No current facility-administered medications for this visit.       Allergies   Allergen  Reactions   .  Estrogens         Blood clot in leg       Family History   Problem  Relation  Age of Onset   .  Hypertension  Mother     .  Aneurysm  Mother     .  Cancer  Mother         Uterine   .  Thyroid cancer  Mother     .  Cancer  Other     .  Thyroid cancer  Sister     .  Arthritis  Sister     .  Hyperlipidemia  Sister     .  Hypertension  Sister     .  Stroke  Sister     .  Kidney disease  Sister     .  Hyperlipidemia  Brother     .  Thyroid cancer  Daughter     .  Breast cancer  Maternal Grandmother     .  Hypertension  Maternal Grandmother         History       Social History   .  Marital Status:  Divorced       Spouse Name:  N/A       Number of Children:  1   .  Years of Education:  14       Occupational History   .  Retired         Therapist, sports       Social History Main Topics   .  Smoking status:  Current Every Day Smoker -- 0.25 packs/day for 25 years       Types:  Cigarettes   .  Smokeless tobacco:  Never Used   .  Alcohol Use:  No   .  Drug Use:  No   .  Sexual Activity:  Yes       Other Topics  Concern   .  None       Social History Narrative     Regular exercise-no     Caffeine Use-yes      REVIEW OF SYSTEMS - PERTINENT POSITIVES ONLY: Persistent lower abdominal pain greater on the right than the left. Anorexia.   EXAM: Filed Vitals:     09/28/13 1514   BP:  116/80   Pulse:  66   Temp:  97.5 F (36.4 C)   Resp:  16      GENERAL:      well-developed, well-nourished, mild discomfort HEENT:           normocephalic; pupils equal and reactive; sclerae clear; dentition good; mucous membranes moist NECK:             symmetric on extension; no palpable anterior  or posterior cervical lymphadenopathy; no supraclavicular masses; no tenderness CHEST:           clear to auscultation bilaterally without rales, rhonchi, or wheezes CARDIAC:       regular rate and rhythm without significant murmur; peripheral pulses are full ABDOMEN:     soft without distension; bowel sounds present; no mass; no hepatosplenomegaly; tenderness to palpation bilateral lower quadrants, maximum tenderness in the suprapubic region with voluntary guarding; mild rebound tenderness EXT:                non-tender without edema; no deformity NEURO:          no gross focal deficits; no sign of tremor     LABORATORY RESULTS: See [facility] (CHL-Epic) for most recent results   RADIOLOGY RESULTS: See [facility] (CHL-Epic) for most recent results   IMPRESSION: #1 acute diverticulitis #2 pelvic abscess secondary to acute diverticulitis   PLAN: I discussed the findings on yesterday's CT scan with the patient and her daughter. We reviewed the written report and the images from her scan. This clearly shows a worsening acute inflammatory process despite oral antibiotic therapy and low-residue diet.   I have recommended readmission to the hospital. We will start her on intravenous antibiotics. We will schedule her for evaluation by interventional radiology for possible percutaneous drainage. If percutaneous drainage is not successful, she will require laparotomy with resection and probable colostomy placement. Patient and her daughter understand and agree to proceed.   I  will notify Dr. Jackolyn Confer who is managing the general surgery service at the hospital.   Valerie Regal, MD, [facility]   Primary Care Physician: [name], MD  H&P by Dr. Harlow Asa done in the office.   She reports a vaginitis and 16 pound wt loss since November with this, she just cannot eat at home, no taste.  Labs pending, I will check  prealbumin and we may want to consider TNA if it is real low before considering surgery.  i will get a wet prep tonight.

## 2013-09-28 NOTE — Progress Notes (Signed)
ANTIBIOTIC CONSULT NOTE - INITIAL  Pharmacy Consult for Zosyn Indication: Diverticulitis with abscess  Allergies  Allergen Reactions  . Estrogens     Blood clot in leg    Patient Measurements: Wt: 82.4kg Ht: 66 in  Vital Signs: Temp: 97.8 F (36.6 C) (01/15 1651) Temp src: Oral (01/15 1651) BP: 105/46 mmHg (01/15 1651) Pulse Rate: 70 (01/15 1651) Intake/Output from previous day:   Intake/Output from this shift:    Labs: 09/21/13: SCr 0.97, CrCl 62 ml/min/1.65m (normalized)  No results found for this basename: WBC, HGB, PLT, LABCREA, CREATININE,  in the last 72 hours The CrCl is unknown because both a height and weight (above a minimum accepted value) are required for this calculation. No results found for this basename: VANCOTROUGH, VANCOPEAK, VANCORANDOM, GENTTROUGH, GENTPEAK, GENTRANDOM, TOBRATROUGH, TOBRAPEAK, TOBRARND, AMIKACINPEAK, AMIKACINTROU, AMIKACIN,  in the last 72 hours   Microbiology: No results found for this or any previous visit (from the past 720 hour(s)).  Medical History: Past Medical History  Diagnosis Date  . Hypertension   . Hypothyroid   . Depression   . Hypercholesteremia   . Sickle cell trait   . Embolus   . GERD (gastroesophageal reflux disease)   . History of blood transfusion     Medications:  Scheduled:  . piperacillin-tazobactam (ZOSYN)  IV  3.375 g Intravenous Once   Infusions:  . dextrose 5 % and 0.45 % NaCl with KCl 20 mEq/L     Assessment: 89yof recently hospitalized with acute diverticulitis with abscess formation. Patient was treated with bowel rest and IV antibiotics (Zosyn 12/30-1/8). She was discharged home 1 week ago on oral Augmentin. Pt continued to have abdominal pain, CT scan of the abdomen and pelvis on 01/14 shows persistent acute diverticulitis involving the sigmoid colon. There is a fistula present and a complex abscess in the pelvis measuring 4.8 cm in size, increased from her prior study. Admit, resume IV abx  (Zosyn per Pharmacy), IR to evaluate for possible drainage.   Goal of Therapy:  Eradication of infection Dose per renal function  Plan:  Zosyn 3.375gm IV q8h (4hr extended infusions) Follow up renal function & cultures (if ordered)  EPeggyann Juba PharmD, BCPS Pager: [phone]/15/2015,5:39 PM

## 2013-09-29 ENCOUNTER — Inpatient Hospital Stay (HOSPITAL_COMMUNITY): Payer: Medicare Other

## 2013-09-29 DIAGNOSIS — K5732 Diverticulitis of large intestine without perforation or abscess without bleeding: Secondary | ICD-10-CM

## 2013-09-29 DIAGNOSIS — I10 Essential (primary) hypertension: Secondary | ICD-10-CM

## 2013-09-29 LAB — URINE CULTURE
Colony Count: NO GROWTH
Culture: NO GROWTH

## 2013-09-29 MED ORDER — LEVOTHYROXINE SODIUM 125 MCG PO TABS
125.0000 ug | ORAL_TABLET | Freq: Every day | ORAL | Status: DC
  Administered 2013-09-29 – 2013-10-17 (×19): 125 ug via ORAL
  Filled 2013-09-29 (×23): qty 1

## 2013-09-29 MED ORDER — MIDAZOLAM HCL 2 MG/2ML IJ SOLN
INTRAMUSCULAR | Status: AC
  Filled 2013-09-29: qty 6

## 2013-09-29 MED ORDER — HEPARIN SODIUM (PORCINE) 5000 UNIT/ML IJ SOLN
5000.0000 [IU] | Freq: Three times a day (TID) | INTRAMUSCULAR | Status: AC
  Filled 2013-09-29 (×36): qty 1

## 2013-09-29 MED ORDER — POTASSIUM CHLORIDE IN NACL 40-0.9 MEQ/L-% IV SOLN
INTRAVENOUS | Status: DC
  Administered 2013-09-29 (×2): via INTRAVENOUS
  Administered 2013-09-30: 100 mL/h via INTRAVENOUS
  Administered 2013-09-30: 07:00:00 via INTRAVENOUS
  Administered 2013-10-01: 100 mL/h via INTRAVENOUS
  Administered 2013-10-02 – 2013-10-13 (×16): via INTRAVENOUS
  Filled 2013-09-29 (×36): qty 1000

## 2013-09-29 MED ORDER — FENTANYL CITRATE 0.05 MG/ML IJ SOLN
INTRAMUSCULAR | Status: AC
  Filled 2013-09-29: qty 6

## 2013-09-29 MED ORDER — FLUCONAZOLE 150 MG PO TABS
150.0000 mg | ORAL_TABLET | Freq: Once | ORAL | Status: AC
  Administered 2013-09-29: 150 mg via ORAL
  Filled 2013-09-29: qty 1

## 2013-09-29 NOTE — Progress Notes (Signed)
Patient seen and examined.  Still quite tender in her LLQ.  She went for CT guided drain placement and abscess is now 2 cm in size.  Will keep her on IV antibiotics and bowel rest.  When tenderness improves/resolves, will start clear liquids.

## 2013-09-29 NOTE — Care Management Note (Addendum)
    Page 1 of 1   10/16/2013     10:59:18 AM   CARE MANAGEMENT NOTE 10/16/2013  Patient:  KITRINA, MAURIN R   Account Number:  [id number]  Date Initiated:  09/29/2013  Documentation initiated by:  Sunday Spillers  Subjective/Objective Assessment:   68 yo female admitted with abd pain, diverticular abscess. PTA lived at home with daugther     Action/Plan:   Home when stable   Anticipated DC Date:  10/17/2013   Anticipated DC Plan:  [place]  CM consult      [facility] Choice  HOME HEALTH   Choice offered to / List presented to:  C-1 Patient        [place] arranged  HH-1 RN  [place].   Status of service:  Completed, signed off Medicare Important Message given?   (If response is "NO", the following Medicare IM given date fields will be blank) Date Medicare IM given:   Date Additional Medicare IM given:    Discharge Disposition:  [place]  Per UR Regulation:  Reviewed for med. necessity/level of care/duration of stay  If discussed at [place] of Stay Meetings, dates discussed:    Comments:  10-16-13 Sunday Spillers RN CM 1000 Spoke with patient at bedside, lives with daughter who will be primary caregiver at d/c. Wants [facility] for [facility] services. Awaiting final orders, contacted [facility] to follow for ostomy wound care and PT.

## 2013-09-29 NOTE — Progress Notes (Signed)
Patient ID: Valerie Jackson, female   DOB: 1946-01-07, 68 y.o.   MRN: [id number] Request received for CT guided drainage of diverticular abscess in pt. Imaging studies were reviewed by Dr. Barbie Banner. Additional PMH as below. Exam: pt awake/alert; chest- CTA bilat; heart- RRR; abd- soft,+BS, mildly tender pelvic region; ext- no edema.   Filed Vitals:   09/28/13 1651 09/28/13 1900 09/28/13 2201 09/29/13 0527  BP: 105/46  127/82 113/74  Pulse: 70  77 67  Temp: 97.8 F (36.6 C)  98.7 F (37.1 C) 97.7 F (36.5 C)  TempSrc: Oral  Oral Oral  Resp: 18  18 18   Height:  5' 6"  (1.676 m)    Weight:  182 lb 6.2 oz (82.73 kg)    SpO2: 100%  98% 98%   Past Medical History  Diagnosis Date  . Hypertension   . Hypothyroid   . Depression   . Hypercholesteremia   . Sickle cell trait   . Embolus   . GERD (gastroesophageal reflux disease)   . History of blood transfusion    Past Surgical History  Procedure Laterality Date  . Cholecystectomy  2002  . Tonsillectomy  1952   Ct Abdomen Pelvis W Contrast  09/27/2013   CLINICAL DATA:  Followup diverticulitis and diverticular abscess.  EXAM: CT ABDOMEN AND PELVIS WITH CONTRAST  TECHNIQUE: Multidetector CT imaging of the abdomen and pelvis was performed using the standard protocol following bolus administration of intravenous contrast.  CONTRAST:  121m OMNIPAQUE IOHEXOL 300 MG/ML  SOLN  COMPARISON:  09/17/2013  FINDINGS: Mild sigmoid diverticulitis shows no significant change. Extraluminal gas is again seen along a complex fistulous tract in the sigmoid mesocolon, posterior to the uterus. This terminates and a focal collection in the inferior pelvic cul-de-sac containing fluid in gas, which measures 3.3 x 4.8 by 4.4 cm. This is increased in size from 2.4 x 3.4 cm and is consistent with a diverticular abscess. No evidence of bowel obstruction.  Hepatic steatosis again demonstrated. Prior cholecystectomy again noted. Diffuse biliary ductal dilatation is stable. A few  tiny sub-cm low-attenuation lesions in the spleen are stable, presumably representing benign cysts or hemangiomas. The pancreas and adrenal glands are normal in appearance. A few tiny bilateral renal cysts are again demonstrated, however there is no evidence of renal masses or hydronephrosis. No other soft tissue masses or lymphadenopathy identified within the abdomen or pelvis.  IMPRESSION: Persistent mild sigmoid diverticulitis. Complex gas containing fistulae in the sigmoid mesocolon, terminating in a 4.8 cm abscess in the inferior pelvic cul-de-sac, which shows further increase in size since previous study.  Stable hepatic steatosis, and diffuse biliary ductal dilatation likely related to prior cholecystectomy.  These results will be called to the ordering clinician or representative by the Radiologist Assistant, and communication documented in the PACS Dashboard.   Electronically Signed   By: JEarle GellM.D.   On: 09/27/2013 12:08   Ct Abdomen Pelvis W Contrast  09/17/2013   CLINICAL DATA:  Followup evaluation for sigmoid diverticulitis and pericolonic abscess.  EXAM: CT ABDOMEN AND PELVIS WITH CONTRAST  TECHNIQUE: Multidetector CT imaging of the abdomen and pelvis was performed using the standard protocol following bolus administration of intravenous contrast.  CONTRAST:  539mOMNIPAQUE IOHEXOL 300 MG/ML SOLN, 1005mMNIPAQUE IOHEXOL 300 MG/ML SOLN  COMPARISON:  CT of the abdomen and pelvis 09/11/2013.  FINDINGS: Lung Bases: Minimal subsegmental atelectasis in the lower lobes of the lungs bilaterally.  Abdomen/Pelvis: Status post cholecystectomy. Mild intrahepatic biliary ductal dilatation is  unchanged compared to the prior study, and appears to be chronic in this patient. Common bile duct is slightly prominent measuring 10 mm in the porta hepatis (also unchanged). No focal hepatic lesions are identified on today's examination. Diffuse decreased attenuation throughout the hepatic parenchyma is suggestive of  hepatic steatosis (although this is difficult to say for certain on a contrast-enhanced CT scan). The appearance of the pancreas, spleen and bilateral adrenal glands is unremarkable. Numerous subcentimeter low attenuation lesions in the kidneys bilaterally are unchanged in size, number and pattern of distribution compared to the prior study, and although too small to definitively characterize are favored to represent small cysts.  Again noted are numerous colonic diverticulae, most severe in the region of the sigmoid colon. Immediately inferior to the distal sigmoid colon in the central pelvis, posterior to the uterine body, there is a well-defined 2.4 x 3.4 x 2.0 cm low-attenuation fluid collection with rim enhancement, compatible with a diverticular abscess. This appears slightly larger than the prior study. This is best demonstrated on axial image 75 of series 2, sagittal image 103 of series 6, and coronal image 129 of series 5. Surrounding inflammatory changes are again noted in the associated sigmoid mesocolon. No pneumoperitoneum to suggest frank perforation at this time. Trace volume of ascites. No pathologic distention of small bowel. No definite lymphadenopathy identified within the abdomen or pelvis. Uterus and bilateral ovaries are unremarkable in appearance. Urinary bladder is normal in appearance.  Musculoskeletal: There are no aggressive appearing lytic or blastic lesions noted in the visualized portions of the skeleton.  IMPRESSION: 1. Persistent evidence of sigmoid diverticulitis with enlarging diverticular abscess, as discussed above. No signs of frank perforation at this time. 2. Status post cholecystectomy. Persistent mild dilatation of the common bile duct and mild intrahepatic biliary ductal dilatation, which appears to be chronic in this patient. Correlation with liver function tests is recommended. 3. Possible hepatic steatosis. 4. Additional incidental findings, as above.   Electronically  Signed   By: Vinnie Langton M.D.   On: 09/17/2013 10:44   Ct Abdomen Pelvis W Contrast  09/12/2013   CLINICAL DATA:  Abdominal pain starting 2 days ago. Pain is in the epigastric region along the entire abdomen with tenderness. Vomiting 2 days ago. White cell count 15.5.  EXAM: CT ABDOMEN AND PELVIS WITH CONTRAST  TECHNIQUE: Multidetector CT imaging of the abdomen and pelvis was performed using the standard protocol following bolus administration of intravenous contrast.  CONTRAST:  76m OMNIPAQUE IOHEXOL 300 MG/ML SOLN, 1028mOMNIPAQUE IOHEXOL 300 MG/ML SOLN  COMPARISON:  None.  FINDINGS: Mild dependent changes in the lung bases. Small peripheral collapse. Surgical clip adjacent to the right cardiophrenic angle. Small esophageal hiatal hernia.  Surgical absence of the gallbladder. There is mild intra and extrahepatic bile duct dilatation. This may be physiologic in the setting of previous cholecystectomy. No obstructing stone or mass is visualized. Non radiopaque common duct stone is not excluded. Consider further evaluation with liver function studies and if positive, consider MRCP in the elective setting. Diffuse fatty infiltration of the liver without focal liver lesion. Spleen size is normal. The pancreas, adrenal glands, abdominal aorta, inferior vena cava, and retroperitoneal lymph nodes are unremarkable. Multiple small subcentimeter cysts in the kidneys. No solid mass or hydronephrosis demonstrated. The stomach, small bowel, and colon are not abnormally distended. No free air or free fluid in the abdomen. Small umbilical hernia containing fat.  Pelvis: Uterus and ovaries are not enlarged. Diverticulosis of the sigmoid colon  with infiltration around the colon and with small fluid collections inferior to the colon and in the perirectal space. Changes are consistent with acute diverticulitis. Early abscess measuring 1.5 cm diameter is suggested. The appendix is not visualized. Bladder wall is not  thickened. No significant pelvic lymphadenopathy. The degenerative changes in the lumbar spine. No destructive bone lesions appreciated.  IMPRESSION: Diverticulosis with acute diverticulitis in the sigmoid colon. Pericolonic infiltration with small abscess measuring 1.5 cm diameter.   Electronically Signed   By: Lucienne Capers M.D.   On: 09/12/2013 00:17   Ir Fluoro Guide Cv Line Right  09/19/2013   CLINICAL DATA:  68 year old female with a clinical history of pneumonia in need durable central venous access for outpatient IV antibiotic therapy.  EXAM: IR RIGHT FLOURO GUIDE CV LINE; IR ULTRASOUND GUIDANCE VASC ACCESS RIGHT  TECHNIQUE: The right arm was prepped with chlorhexidine, draped in the usual sterile fashion using maximum barrier technique (cap and mask, sterile gown, sterile gloves, large sterile sheet, hand hygiene and cutaneous antiseptic). Local anesthesia was attained by infiltration with 1% lidocaine.  Ultrasound demonstrated patency of the right brachial vein, and this was documented with an image. Under real-time ultrasound guidance, this vein was accessed with a 21 gauge micropuncture needle and image documentation was performed. The needle was exchanged over a guidewire for a peel-away sheath through which a 36 cm 5 [place] dual lumen Power injectable PICC was advanced, and positioned with its tip at the lower SVC/right atrial junction. Fluoroscopy during the procedure and fluoro spot radiograph confirms appropriate catheter position. The catheter was flushed, secured to the skin with Prolene sutures, and covered with a sterile dressing.  FLUOROSCOPY TIME:  12 seconds  COMPLICATIONS: None.  The patient tolerated the procedure well.  IMPRESSION: Successful placement of a right Power PICC with sonographic and fluoroscopic guidance. The catheter is ready for use.  Signed,  Criselda Peaches, MD  [facility]  [facility]   Electronically Signed   By: Jacqulynn Cadet M.D.   On: 09/19/2013 17:07   Ir US Guide Vasc Access Right  09/19/2013   CLINICAL DATA:  68 year old female with a clinical history of pneumonia in need durable central venous access for outpatient IV antibiotic therapy.  EXAM: IR RIGHT FLOURO GUIDE CV LINE; IR ULTRASOUND GUIDANCE VASC ACCESS RIGHT  TECHNIQUE: The right arm was prepped with chlorhexidine, draped in the usual sterile fashion using maximum barrier technique (cap and mask, sterile gown, sterile gloves, large sterile sheet, hand hygiene and cutaneous antiseptic). Local anesthesia was attained by infiltration with 1% lidocaine.  Ultrasound demonstrated patency of the right brachial vein, and this was documented with an image. Under real-time ultrasound guidance, this vein was accessed with a 21 gauge micropuncture needle and image documentation was performed. The needle was exchanged over a guidewire for a peel-away sheath through which a 36 cm 5 [place] dual lumen Power injectable PICC was advanced, and positioned with its tip at the lower SVC/right atrial junction. Fluoroscopy during the procedure and fluoro spot radiograph confirms appropriate catheter position. The catheter was flushed, secured to the skin with Prolene sutures, and covered with a sterile dressing.  FLUOROSCOPY TIME:  12 seconds  COMPLICATIONS: None.  The patient tolerated the procedure well.  IMPRESSION: Successful placement of a right Power PICC with sonographic and fluoroscopic guidance. The catheter is ready for use.  Signed,  Criselda Peaches, MD  [facility]  [facility]  Electronically Signed   By: Jacqulynn Cadet M.D.   On: 09/19/2013 17:07  Results for orders placed during the hospital encounter of 46/95/07  BASIC METABOLIC PANEL      Result Value Range   Sodium 139  137 - 147 mEq/L   Potassium 3.5 (*) 3.7 - 5.3 mEq/L   Chloride 100  96 - 112 mEq/L   CO2 25  19 - 32 mEq/L   Glucose, Bld 109 (*) 70 - 99  mg/dL   BUN 10  6 - 23 mg/dL   Creatinine, Ser 1.02  0.50 - 1.10 mg/dL   Calcium 9.6  8.4 - 10.5 mg/dL   GFR calc non Af Amer 55 (*) >90 mL/min   GFR calc Af Amer 64 (*) >90 mL/min  CBC WITH DIFFERENTIAL      Result Value Range   WBC 10.4  4.0 - 10.5 K/uL   RBC 3.74 (*) 3.87 - 5.11 MIL/uL   Hemoglobin 10.8 (*) 12.0 - 15.0 g/dL   HCT 31.8 (*) 36.0 - 46.0 %   MCV 85.0  78.0 - 100.0 fL   MCH 28.9  26.0 - 34.0 pg   MCHC 34.0  30.0 - 36.0 g/dL   RDW 13.3  11.5 - 15.5 %   Platelets 338  150 - 400 K/uL   Neutrophils Relative % 75  43 - 77 %   Neutro Abs 7.7  1.7 - 7.7 K/uL   Lymphocytes Relative 16  12 - 46 %   Lymphs Abs 1.7  0.7 - 4.0 K/uL   Monocytes Relative 6  3 - 12 %   Monocytes Absolute 0.6  0.1 - 1.0 K/uL   Eosinophils Relative 3  0 - 5 %   Eosinophils Absolute 0.3  0.0 - 0.7 K/uL   Basophils Relative 0  0 - 1 %   Basophils Absolute 0.0  0.0 - 0.1 K/uL  PREALBUMIN      Result Value Range   Prealbumin 15.4 (*) 17.0 - 34.0 mg/dL   A/P: Pt with diverticular abscess. Plan is for CT guided drainage of abscess today. Details/risks of procedure d/w pt/daughter with their understanding and consent.

## 2013-09-29 NOTE — Progress Notes (Signed)
Patient ID: Valerie Jackson, female   DOB: 09/05/1946, 68 y.o.   MRN: [id number] The patient was brought down for a pelvic abscess drain. Ct imaging shows that the abscess has markedly improved. The collection is mostly gas now. No abscess drain is indicated at this time.

## 2013-09-29 NOTE — Progress Notes (Signed)
Subjective: Feeling Ok this AM, discharge from vagina is better this AM after diflucan.  NPO and awaiting IR.  Objective: Vital signs in last 24 hours: Temp:  [97.5 F (36.4 C)-98.7 F (37.1 C)] 97.7 F (36.5 C) (01/16 0527) Pulse Rate:  [66-77] 67 (01/16 0527) Resp:  [16-18] 18 (01/16 0527) BP: (105-127)/(46-82) 113/74 mmHg (01/16 0527) SpO2:  [98 %-100 %] 98 % (01/16 0527) Weight:  [82.373 kg (181 lb 9.6 oz)-82.73 kg (182 lb 6.2 oz)] 82.73 kg (182 lb 6.2 oz) (01/15 1900) Last BM Date: 09/21/13 Npo Afebrile, VSS K+ 3.5 Prealbumin 15.3 Intake/Output from previous day: 01/15 0701 - 01/16 0700 In: 744.2 [P.O.:120; I.V.:574.2; IV Piggyback:50] Out: 1350 [Urine:1350] Intake/Output this shift:    General appearance: alert, cooperative and no distress GI: soft sore, no distension.  Lab Results:   Recent Labs  09/28/13 1719  WBC 10.4  HGB 10.8*  HCT 31.8*  PLT 338    BMET  Recent Labs  09/28/13 1719  NA 139  K 3.5*  CL 100  CO2 25  GLUCOSE 109*  BUN 10  CREATININE 1.02  CALCIUM 9.6   PT/INR No results found for this basename: LABPROT, INR,  in the last 72 hours  No results found for this basename: AST, ALT, ALKPHOS, BILITOT, PROT, ALBUMIN,  in the last 168 hours   Lipase     Component Value Date/Time   LIPASE 24 09/11/2013 2240     Studies/Results: Ct Abdomen Pelvis W Contrast  09/27/2013   CLINICAL DATA:  Followup diverticulitis and diverticular abscess.  EXAM: CT ABDOMEN AND PELVIS WITH CONTRAST  TECHNIQUE: Multidetector CT imaging of the abdomen and pelvis was performed using the standard protocol following bolus administration of intravenous contrast.  CONTRAST:  158m OMNIPAQUE IOHEXOL 300 MG/ML  SOLN  COMPARISON:  09/17/2013  FINDINGS: Mild sigmoid diverticulitis shows no significant change. Extraluminal gas is again seen along a complex fistulous tract in the sigmoid mesocolon, posterior to the uterus. This terminates and a focal collection in  the inferior pelvic cul-de-sac containing fluid in gas, which measures 3.3 x 4.8 by 4.4 cm. This is increased in size from 2.4 x 3.4 cm and is consistent with a diverticular abscess. No evidence of bowel obstruction.  Hepatic steatosis again demonstrated. Prior cholecystectomy again noted. Diffuse biliary ductal dilatation is stable. A few tiny sub-cm low-attenuation lesions in the spleen are stable, presumably representing benign cysts or hemangiomas. The pancreas and adrenal glands are normal in appearance. A few tiny bilateral renal cysts are again demonstrated, however there is no evidence of renal masses or hydronephrosis. No other soft tissue masses or lymphadenopathy identified within the abdomen or pelvis.  IMPRESSION: Persistent mild sigmoid diverticulitis. Complex gas containing fistulae in the sigmoid mesocolon, terminating in a 4.8 cm abscess in the inferior pelvic cul-de-sac, which shows further increase in size since previous study.  Stable hepatic steatosis, and diffuse biliary ductal dilatation likely related to prior cholecystectomy.  These results will be called to the ordering clinician or representative by the Radiologist Assistant, and communication documented in the PACS Dashboard.   Electronically Signed   By: JEarle GellM.D.   On: 09/27/2013 12:08    Medications: . fluconazole  150 mg Oral Once  . levothyroxine  125 mcg Oral Daily  . piperacillin-tazobactam (ZOSYN)  IV  3.375 g Intravenous Q8H    Assessment/Plan Diverticulitis with enlarging abscess on PO antibiotics. Vaginitis after antibiotics Hypertension  Hypothyroid  Depression  Hx of Sickle cell trait  Hx of PE  GERD  Hypercholesterolemia   Plan:  IR to drain today, IV antibiotics, IV hydration, and follow.   LOS: 1 day    Calleigh Lafontant 09/29/2013

## 2013-09-30 LAB — BASIC METABOLIC PANEL
BUN: 8 mg/dL (ref 6–23)
CO2: 25 mEq/L (ref 19–32)
Calcium: 9.4 mg/dL (ref 8.4–10.5)
Chloride: 106 mEq/L (ref 96–112)
Creatinine, Ser: 1.24 mg/dL — ABNORMAL HIGH (ref 0.50–1.10)
GFR calc Af Amer: 51 mL/min — ABNORMAL LOW (ref >90)
GFR calc non Af Amer: 44 mL/min — ABNORMAL LOW (ref >90)
Glucose, Bld: 93 mg/dL (ref 70–99)
Potassium: 4.5 mEq/L (ref 3.7–5.3)
Sodium: 142 mEq/L (ref 137–147)

## 2013-09-30 LAB — CBC
HCT: 30 % — ABNORMAL LOW (ref 36.0–46.0)
Hemoglobin: 9.7 g/dL — ABNORMAL LOW (ref 12.0–15.0)
MCH: 27.8 pg (ref 26.0–34.0)
MCHC: 32.3 g/dL (ref 30.0–36.0)
MCV: 86 fL (ref 78.0–100.0)
Platelets: 326 10*3/uL (ref 150–400)
RBC: 3.49 MIL/uL — ABNORMAL LOW (ref 3.87–5.11)
RDW: 13.3 % (ref 11.5–15.5)
WBC: 6.2 10*3/uL (ref 4.0–10.5)

## 2013-09-30 NOTE — Progress Notes (Signed)
General Surgery Note  LOS: 2 days  POD -     Assessment/Plan: 1.  Diverticulitis with abscess.  Admitted 09/10/2013 - 09/21/2013, but then was redmitted.  Went to IR 09/29/2013, but because of improvement of abscess - no drain  WBC - 6,200 - 09/30/2013  On Zosyn - 1/15 >>>>  Overall better.  Will leave on just sips from floor.  2.  DVT prophylaxis - SQ Heparin 3.  HTN 4.  Hypothyroid 5.  Depression 6.  History of Sickle cell trait 7.  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  She has only been in [place] for one year.  She is here because of her daughter. Still does not feel good.  She is not a runner.  I asked her about the [place].  Objective:   Filed Vitals:   09/30/13 0525  BP: 128/79  Pulse: 67  Temp: 97.9 F (36.6 C)  Resp: 18     Intake/Output from previous day:  01/16 0701 - 01/17 0700 In: 2056.7 [I.V.:1906.7; IV Piggyback:150] Out: 1800 [Urine:1800]  Intake/Output this shift:  Total I/O In: -  Out: 450 [Urine:450]   Physical Exam:   General: AA F who is alert and oriented.    HEENT: Normal. Pupils equal. .   Lungs: Clear   Abdomen: BS present. Tender RLQ.     Lab Results:    Recent Labs  09/28/13 1719 09/30/13 0608  WBC 10.4 6.2  HGB 10.8* 9.7*  HCT 31.8* 30.0*  PLT 338 326    BMET   Recent Labs  09/28/13 1719 09/30/13 0608  NA 139 142  K 3.5* 4.5  CL 100 106  CO2 25 25  GLUCOSE 109* 93  BUN 10 8  CREATININE 1.02 1.24*  CALCIUM 9.6 9.4    PT/INR  No results found for this basename: LABPROT, INR,  in the last 72 hours  ABG  No results found for this basename: PHART, PCO2, PO2, HCO3,  in the last 72 hours   Studies/Results:  Ct Pelvis Wo Contrast  09/29/2013   CLINICAL DATA:  Pelvic abscess.  EXAM: CT PELVIS WITHOUT CONTRAST  TECHNIQUE: Multidetector CT imaging of the pelvis was performed  following the standard protocol without intravenous contrast.  COMPARISON:  09/27/2013  FINDINGS: The patient was positioned prone for anticipated trans gluteal abscess drainage. Imaging demonstrates that the fluid component of the abscess has nearly completely resolved. There continues to be a gas collection deep in the pelvis with a small amount of fluid. A few air bubbles are present anterior and superior to the sigmoid colon. Overall, there has been marked improvement. There is persistent wall thickening of the colon. Today, the pelvic fluid collection which is mostly gas measures 2.0 x 1.6 cm and previously measured up to 4.5 cm.  IMPRESSION: Marked improvement in the pelvic abscess which is now mostly gas. No drainage is indicated at this time. Continued IV antibiotics is recommended.   Electronically Signed   By: Maryclare Bean M.D.   On: 09/29/2013 10:59     Anti-infectives:   Anti-infectives   Start     Dose/Rate Route Frequency Ordered Stop   09/29/13 2200  fluconazole (DIFLUCAN) tablet 150 mg     150 mg Oral  Once 09/29/13 0708 09/29/13 2118   09/29/13 0200  piperacillin-tazobactam (ZOSYN) IVPB 3.375  g     3.375 g 12.5 mL/hr over 240 Minutes Intravenous Every 8 hours 09/28/13 1743     09/28/13 2000  fluconazole (DIFLUCAN) tablet 150 mg     150 mg Oral  Once 09/28/13 1842 09/28/13 2127   09/28/13 1800  piperacillin-tazobactam (ZOSYN) IVPB 3.375 g     3.375 g 12.5 mL/hr over 240 Minutes Intravenous  Once 09/28/13 1650 09/28/13 2249      Alphonsa Overall, MD, FACS Pager: [place] [facility] Office: [phone] 09/30/2013

## 2013-10-01 LAB — CBC WITH DIFFERENTIAL/PLATELET
Basophils Absolute: 0.1 10*3/uL (ref 0.0–0.1)
Basophils Relative: 1 % (ref 0–1)
Eosinophils Absolute: 0.4 10*3/uL (ref 0.0–0.7)
Eosinophils Relative: 4 % (ref 0–5)
HCT: 32 % — ABNORMAL LOW (ref 36.0–46.0)
Hemoglobin: 10.4 g/dL — ABNORMAL LOW (ref 12.0–15.0)
Lymphocytes Relative: 23 % (ref 12–46)
Lymphs Abs: 2.1 10*3/uL (ref 0.7–4.0)
MCH: 27.7 pg (ref 26.0–34.0)
MCHC: 32.5 g/dL (ref 30.0–36.0)
MCV: 85.3 fL (ref 78.0–100.0)
Monocytes Absolute: 0.5 10*3/uL (ref 0.1–1.0)
Monocytes Relative: 5 % (ref 3–12)
Neutro Abs: 6.2 10*3/uL (ref 1.7–7.7)
Neutrophils Relative %: 68 % (ref 43–77)
Platelets: 333 10*3/uL (ref 150–400)
RBC: 3.75 MIL/uL — ABNORMAL LOW (ref 3.87–5.11)
RDW: 13.1 % (ref 11.5–15.5)
WBC: 9.1 10*3/uL (ref 4.0–10.5)

## 2013-10-01 MED ORDER — BIOTENE DRY MOUTH MT LIQD: 15.0000 mL | OROMUCOSAL | Status: DC | PRN

## 2013-10-01 MED ORDER — CHLORHEXIDINE GLUCONATE 0.12 % MT SOLN
15.0000 mL | Freq: Two times a day (BID) | OROMUCOSAL | Status: DC
  Administered 2013-10-01 – 2013-10-14 (×13): 15 mL via OROMUCOSAL
  Filled 2013-10-01 (×30): qty 15

## 2013-10-01 NOTE — Progress Notes (Signed)
Pt refusing heparin. Pt educated on about reason for heparin use and possible consequences. Dr Lucia Gaskins notified  that patient is refusing heparin. No new orders.

## 2013-10-01 NOTE — Progress Notes (Signed)
Pharmacy: Brief Antibiotic note: Zosyn   68 yo F on D#4 IV Zosyn for Diverticularitis with abscess.  Admitted 09/10/2013 - 09/21/2013 for diverticulitis, but then was readmitted Friday, 09/30/2103 for recurrent disease.  Started on IV zosyn at that time.   Slight bump in Scr 1.24, CrCl 47 ml/min   Cultures 1/15 Urine: NG Final    Plan  1.) Continue Zosyn 3.375 grams IV q8h 2.) Monitor renal function 3.) Pharmacy will sign off on writing notes since no cultures to follow.   Will monitor renal function and Length of therapy peripherally.   Houda Brau, Gaye Alken PharmD Pager #: [phone] 1:58 PM 10/01/2013

## 2013-10-01 NOTE — Progress Notes (Signed)
General Surgery Note  LOS: 3 days  POD -     Assessment/Plan: 1.  Diverticulitis with abscess.  Admitted 09/10/2013 - 09/21/2013 for diverticulitis, but then was readmitted Friday, 09/30/2103 for recurrent disease.  Went to IR 09/29/2013, but because of improvement of abscess - no drain was placed.  WBC - 9,100 - 10/01/2013  On Zosyn - 1/15 >>>>  Looks a little better than yesterday.  Will start clear liq tray.  2.  DVT prophylaxis - SQ Heparin 3.  HTN 4.  Hypothyroid 5.  Depression 6.  History of Sickle cell trait 7.  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  She has only been in [place] for one year.  She is here because of her daughter.   She is bored.  She has some pain with bowel movements, but is otherwise doing okay.  She has tolerated the sips from the floor.  Objective:   Filed Vitals:   10/01/13 0510  BP: 127/88  Pulse: 71  Temp: 98.1 F (36.7 C)  Resp: 18     Intake/Output from previous day:  01/17 0701 - 01/18 0700 In: 2533.3 [I.V.:2383.3; IV Piggyback:150] Out: 2525 [Urine:2525]  Intake/Output this shift:      Physical Exam:   General: AA F who is alert and oriented.    HEENT: Normal. Pupils equal. .   Lungs: Clear   Abdomen: BS present. She has minimal suprapubic pain.  No peritoneal sxes.     Lab Results:     Recent Labs  09/30/13 0608 10/01/13 0515  WBC 6.2 9.1  HGB 9.7* 10.4*  HCT 30.0* 32.0*  PLT 326 333    BMET    Recent Labs  09/28/13 1719 09/30/13 0608  NA 139 142  K 3.5* 4.5  CL 100 106  CO2 25 25  GLUCOSE 109* 93  BUN 10 8  CREATININE 1.02 1.24*  CALCIUM 9.6 9.4    PT/INR  No results found for this basename: LABPROT, INR,  in the last 72 hours  ABG  No results found for this basename: PHART, PCO2, PO2, HCO3,  in the last 72 hours   Studies/Results:  Ct Pelvis Wo Contrast  09/29/2013    CLINICAL DATA:  Pelvic abscess.  EXAM: CT PELVIS WITHOUT CONTRAST  TECHNIQUE: Multidetector CT imaging of the pelvis was performed following the standard protocol without intravenous contrast.  COMPARISON:  09/27/2013  FINDINGS: The patient was positioned prone for anticipated trans gluteal abscess drainage. Imaging demonstrates that the fluid component of the abscess has nearly completely resolved. There continues to be a gas collection deep in the pelvis with a small amount of fluid. A few air bubbles are present anterior and superior to the sigmoid colon. Overall, there has been marked improvement. There is persistent wall thickening of the colon. Today, the pelvic fluid collection which is mostly gas measures 2.0 x 1.6 cm and previously measured up to 4.5 cm.  IMPRESSION: Marked improvement in the pelvic abscess which is now mostly gas. No drainage is indicated at this time. Continued IV antibiotics is recommended.   Electronically Signed   By: Maryclare Bean M.D.   On: 09/29/2013 10:59     Anti-infectives:   Anti-infectives   Start     Dose/Rate Route Frequency Ordered Stop   09/29/13 2200  fluconazole (DIFLUCAN) tablet 150 mg  150 mg Oral  Once 09/29/13 0708 09/29/13 2118   09/29/13 0200  piperacillin-tazobactam (ZOSYN) IVPB 3.375 g     3.375 g 12.5 mL/hr over 240 Minutes Intravenous Every 8 hours 09/28/13 1743     09/28/13 2000  fluconazole (DIFLUCAN) tablet 150 mg     150 mg Oral  Once 09/28/13 1842 09/28/13 2127   09/28/13 1800  piperacillin-tazobactam (ZOSYN) IVPB 3.375 g     3.375 g 12.5 mL/hr over 240 Minutes Intravenous  Once 09/28/13 1650 09/28/13 2249      Alphonsa Overall, MD, FACS Pager: [facility] Office: [phone] 10/01/2013

## 2013-10-02 LAB — BASIC METABOLIC PANEL
BUN: 4 mg/dL — ABNORMAL LOW (ref 6–23)
CO2: 22 mEq/L (ref 19–32)
Calcium: 9.9 mg/dL (ref 8.4–10.5)
Chloride: 104 mEq/L (ref 96–112)
Creatinine, Ser: 1.14 mg/dL — ABNORMAL HIGH (ref 0.50–1.10)
GFR calc Af Amer: 56 mL/min — ABNORMAL LOW (ref >90)
GFR calc non Af Amer: 48 mL/min — ABNORMAL LOW (ref >90)
Glucose, Bld: 96 mg/dL (ref 70–99)
Potassium: 4.5 mEq/L (ref 3.7–5.3)
Sodium: 140 mEq/L (ref 137–147)

## 2013-10-02 LAB — CBC WITH DIFFERENTIAL/PLATELET
Basophils Absolute: 0 10*3/uL (ref 0.0–0.1)
Basophils Relative: 1 % (ref 0–1)
Eosinophils Absolute: 0.3 10*3/uL (ref 0.0–0.7)
Eosinophils Relative: 4 % (ref 0–5)
HCT: 33.1 % — ABNORMAL LOW (ref 36.0–46.0)
Hemoglobin: 11 g/dL — ABNORMAL LOW (ref 12.0–15.0)
Lymphocytes Relative: 33 % (ref 12–46)
Lymphs Abs: 2.3 10*3/uL (ref 0.7–4.0)
MCH: 28.4 pg (ref 26.0–34.0)
MCHC: 33.2 g/dL (ref 30.0–36.0)
MCV: 85.3 fL (ref 78.0–100.0)
Monocytes Absolute: 0.4 10*3/uL (ref 0.1–1.0)
Monocytes Relative: 6 % (ref 3–12)
Neutro Abs: 4 10*3/uL (ref 1.7–7.7)
Neutrophils Relative %: 57 % (ref 43–77)
Platelets: 341 10*3/uL (ref 150–400)
RBC: 3.88 MIL/uL (ref 3.87–5.11)
RDW: 13.3 % (ref 11.5–15.5)
WBC: 7.1 10*3/uL (ref 4.0–10.5)

## 2013-10-02 MED ORDER — ENSURE PUDDING PO PUDG
1.0000 | Freq: Three times a day (TID) | ORAL | Status: DC
  Administered 2013-10-02: 1 via ORAL
  Filled 2013-10-02 (×44): qty 1

## 2013-10-02 MED ORDER — BOOST / RESOURCE BREEZE PO LIQD
1.0000 | Freq: Two times a day (BID) | ORAL | Status: DC
  Administered 2013-10-02 – 2013-10-17 (×25): 1 via ORAL

## 2013-10-02 NOTE — Progress Notes (Signed)
  Subjective: She is a little down now, tired of being in hospital and sick.  Nothing taste good.  It hurts deep in RLQ and to have a BM.    Objective: Vital signs in last 24 hours: Temp:  [97.3 F (36.3 C)-98.5 F (36.9 C)] 98 F (36.7 C) (01/19 0500) Pulse Rate:  [65-91] 65 (01/19 0500) Resp:  [18] 18 (01/19 0500) BP: (127-147)/(85-91) 127/85 mmHg (01/19 0500) SpO2:  [96 %-98 %] 96 % (01/19 0500) Last BM Date: 10/01/13 360 PO recorded,  1 BM recorded 09/30/13.   Diet: clears Afebrile, VSS,  Creatinine is up some, but improved from 09/30/13  She has had a normal creatinine on admit. WBC is 7.1K No films Intake/Output from previous day: 01/18 0701 - 01/19 0700 In: 2910 [P.O.:360; I.V.:2400; IV Piggyback:150] Out: 7078 [Urine:1650] Intake/Output this shift:    General appearance: alert, cooperative and no distress Resp: clear to auscultation bilaterally GI: soft, tender deep RLQ, +BS and BM.  Lab Results:   Recent Labs  10/01/13 0515 10/02/13 0446  WBC 9.1 7.1  HGB 10.4* 11.0*  HCT 32.0* 33.1*  PLT 333 341    BMET  Recent Labs  09/30/13 0608 10/02/13 0446  NA 142 140  K 4.5 4.5  CL 106 104  CO2 25 22  GLUCOSE 93 96  BUN 8 4*  CREATININE 1.24* 1.14*  CALCIUM 9.4 9.9   PT/INR No results found for this basename: LABPROT, INR,  in the last 72 hours  No results found for this basename: AST, ALT, ALKPHOS, BILITOT, PROT, ALBUMIN,  in the last 168 hours   Lipase     Component Value Date/Time   LIPASE 24 09/11/2013 2240     Studies/Results: No results found.  Medications: . chlorhexidine  15 mL Mouth/Throat BID  . heparin subcutaneous  5,000 Units Subcutaneous Q8H  . levothyroxine  125 mcg Oral QAC breakfast  . piperacillin-tazobactam (ZOSYN)  IV  3.375 g Intravenous Q8H    Assessment/Plan 1. Diverticulitis with abscess. Admitted 09/10/2013 - 09/21/2013 for diverticulitis, but then was readmitted Friday, 09/30/2103 for recurrent disease.  Went to  IR 09/29/2013, but because of improvement of abscess - no drain was placed.  WBC - 9,100 - 10/01/2013  On Zosyn - 1/15 >>>>  2. DVT prophylaxis - SQ Heparin  3. HTN  4. Hypothyroid  5. Depression  6. History of Sickle cell trait  7. Hx of DVT in 20's on BCP (computer wrongly has her as having a PE)   Plan:  I will leave her on clears, and discuss with Dr. Dalbert Batman.  Repeat CT later this week and see how she is doing.     LOS: 4 days    Valerie Jackson 10/02/2013

## 2013-10-02 NOTE — Progress Notes (Addendum)
General surgery attending note:  Patient interviewed and examined. History to date reviewed and imaging studies reviewed. I agree with the assessment and treatment plan outlined by Mr. Creig Hines, [place].  In some ways she is better with absence of leukocytosis, absence of distention, resumption of bowel function. She still has pain, right lower greater than left lower quadrant, and is somewhat tender in RLQ and LLQ.  Allow full liquid diet Reevaluate tomorrow. If pain persists we'll need to reconsider  timing of nextCT scan. There is no acute surgical need.   Edsel Petrin. Dalbert Batman, M.D., [facility] General and Minimally invasive Surgery Breast and Colorectal Surgery Office:   [phone] Pager:   [phone]

## 2013-10-02 NOTE — Progress Notes (Signed)
INITIAL NUTRITION ASSESSMENT  DOCUMENTATION CODES Per approved criteria  -Not Applicable   INTERVENTION: - Ensure Pudding po TID, each supplement provides 170 kcal and 4 grams of protein - Resource Breeze po BID, each supplement provides 250 kcal and 9 grams of protein  NUTRITION DIAGNOSIS: Inadequate oral intake related to diverticular disease as evidenced by wt loss.   Goal: Pt to meet >/= 90% of their estimated nutrition needs   Monitor:  Wt, po intake, labs, acceptance of supplements  Reason for Assessment: Consult  68 y.o. female  Admitting Dx: <principal problem not specified>  ASSESSMENT: 68 year old female recently hospitalized with acute diverticulitis with abscess formation. Patient was treated with bowel rest and intravenous antibiotics. She was discharged home 1 week ago on oral Augmentin.  Patient has continued to have lower abdominal pain. She is eating a very limited diet. She is having loose bowel movements. Patient underwent a CT scan of the abdomen and pelvis on 09/27/2013. This shows persistent acute diverticulitis involving the sigmoid colon.  Pt reports that she has lost 25 lbs within the past year. She says that she has lost her taste for many foods. She does not like Ensure Complete, but agreed to try Ensure Pudding. She also had questions about her diet once discharged from the hospital which were answered by RD. She is recommended to follow a low residue diet by her physician.  Height: Ht Readings from Last 1 Encounters:  09/28/13 5' 6"  (1.676 m)    Weight: Wt Readings from Last 1 Encounters:  09/28/13 182 lb 6.2 oz (82.73 kg)    Ideal Body Weight: 59.3 kg  % Ideal Body Weight: 139%  Wt Readings from Last 10 Encounters:  09/28/13 182 lb 6.2 oz (82.73 kg)  09/28/13 181 lb 9.6 oz (82.373 kg)  09/12/13 187 lb 14.4 oz (85.231 kg)  07/19/13 191 lb 8 oz (86.864 kg)  04/17/13 190 lb (86.183 kg)  04/12/13 189 lb (85.73 kg)  02/27/13 206 lb 12 oz  (93.781 kg)    Usual Body Weight: 206 lbs, April 2014  % Usual Body Weight: 88%  BMI:  Body mass index is 29.45 kg/(m^2).  Estimated Nutritional Needs: Kcal: 2000-2200 Protein: 100-110 g Fluid: 2.0-2.2 L  Skin: WNL  Diet Order: Full Liquid  EDUCATION NEEDS: -Education needs addressed   Intake/Output Summary (Last 24 hours) at 10/02/13 1446 Last data filed at 10/02/13 1318  Gross per 24 hour  Intake 2103.33 ml  Output   1900 ml  Net 203.33 ml    Last BM: 1/17   Labs:   Recent Labs Lab 09/28/13 1719 09/30/13 0608 10/02/13 0446  NA 139 142 140  K 3.5* 4.5 4.5  CL 100 106 104  CO2 25 25 22   BUN 10 8 4*  CREATININE 1.02 1.24* 1.14*  CALCIUM 9.6 9.4 9.9  GLUCOSE 109* 93 96    CBG (last 3)  No results found for this basename: GLUCAP,  in the last 72 hours  Scheduled Meds: . chlorhexidine  15 mL Mouth/Throat BID  . heparin subcutaneous  5,000 Units Subcutaneous Q8H  . levothyroxine  125 mcg Oral QAC breakfast  . piperacillin-tazobactam (ZOSYN)  IV  3.375 g Intravenous Q8H    Continuous Infusions: . 0.9 % NaCl with KCl 40 mEq / L 100 mL/hr at 10/02/13 6294    Past Medical History  Diagnosis Date  . Hypertension   . Hypothyroid   . Depression   . Hypercholesteremia   . Sickle cell trait   .  Embolus   . GERD (gastroesophageal reflux disease)   . History of blood transfusion     Past Surgical History  Procedure Laterality Date  . Cholecystectomy  2002  . Tonsillectomy  1952    Terrace Arabia RD, LDN

## 2013-10-03 ENCOUNTER — Inpatient Hospital Stay (HOSPITAL_COMMUNITY): Payer: Medicare Other

## 2013-10-03 ENCOUNTER — Encounter (HOSPITAL_COMMUNITY): Payer: Self-pay | Admitting: Radiology

## 2013-10-03 MED ORDER — IOHEXOL 300 MG/ML  SOLN
100.0000 mL | Freq: Once | INTRAMUSCULAR | Status: AC | PRN
  Administered 2013-10-03: 100 mL via INTRAVENOUS

## 2013-10-03 MED ORDER — IOHEXOL 300 MG/ML  SOLN
25.0000 mL | INTRAMUSCULAR | Status: AC
  Administered 2013-10-03: 25 mL via ORAL

## 2013-10-03 NOTE — Progress Notes (Signed)
  Subjective: Alert and stable. Tolerating full liquid diet. States that she feels a little bloated.  States her pain is no better and no worse last 48 hours.Urine output increased. Denies pain at rest, but states it is still tender when palpated.Remains afebrile, normotensive with normal pulse rate.  Objective: Vital signs in last 24 hours: Temp:  [97.8 F (36.6 C)-98.1 F (36.7 C)] 98.1 F (36.7 C) (01/20 0528) Pulse Rate:  [69-84] 81 (01/20 0528) Resp:  [16-18] 16 (01/20 0528) BP: (120-153)/(82-92) 120/82 mmHg (01/20 0528) SpO2:  [98 %-100 %] 98 % (01/20 0528) Last BM Date: 10/01/13  Intake/Output from previous day: 01/19 0701 - 01/20 0700 In: 1426.7 [P.O.:480; I.V.:796.7; IV Piggyback:150] Out: 2550 [Urine:2550] Intake/Output this shift: Total I/O In: 946.7 [I.V.:796.7; IV Piggyback:150] Out: 1150 [Urine:1150]  General appearance: alert. Cooperative. Pleasant. Mental status normal. Does not appear ill. GI: soft, nondistended. Tender across lower abdomen with deep palpation. I don't feel a mass. A little bit of guarding present  Lab Results:   Recent Labs  10/01/13 0515 10/02/13 0446  WBC 9.1 7.1  HGB 10.4* 11.0*  HCT 32.0* 33.1*  PLT 333 341   BMET  Recent Labs  10/02/13 0446  NA 140  K 4.5  CL 104  CO2 22  GLUCOSE 96  BUN 4*  CREATININE 1.14*  CALCIUM 9.9   PT/INR No results found for this basename: LABPROT, INR,  in the last 72 hours ABG No results found for this basename: PHART, PCO2, PO2, HCO3,  in the last 72 hours  Studies/Results: No results found.  Anti-infectives: Anti-infectives   Start     Dose/Rate Route Frequency Ordered Stop   09/29/13 2200  fluconazole (DIFLUCAN) tablet 150 mg     150 mg Oral  Once 09/29/13 0708 09/29/13 2118   09/29/13 0200  piperacillin-tazobactam (ZOSYN) IVPB 3.375 g     3.375 g 12.5 mL/hr over 240 Minutes Intravenous Every 8 hours 09/28/13 1743     09/28/13 2000  fluconazole (DIFLUCAN) tablet 150 mg     150  mg Oral  Once 09/28/13 1842 09/28/13 2127   09/28/13 1800  piperacillin-tazobactam (ZOSYN) IVPB 3.375 g     3.375 g 12.5 mL/hr over 240 Minutes Intravenous  Once 09/28/13 1650 09/28/13 2249      Assessment/Plan:  1. Diverticulitis with abscess. Admitted 09/10/2013 - 09/21/2013 for diverticulitis, but then was readmitted Friday, 09/30/2103 for recurrent disease.  Went to IR 09/29/2013, but because of improvement of abscess - no drain was placed.  WBC - 7.1 on 10/02/2012 On Zosyn - 1/15 >>>>  Has persistent tenderness on palpation. Wonder whether we have missed treating  a drainable abscess Repeat CT abd/pelvis with contrast today  2. DVT prophylaxis - SQ Heparin  3. HTN  4. Hypothyroid  5. Depression  6. History of Sickle cell trait  7. Hx of DVT in 20's on BCP (computer wrongly has her as having a PE)    LOS: 5 days    Derrik Mceachern M 10/03/2013

## 2013-10-04 LAB — BASIC METABOLIC PANEL
BUN: 6 mg/dL (ref 6–23)
CO2: 23 mEq/L (ref 19–32)
Calcium: 10.1 mg/dL (ref 8.4–10.5)
Chloride: 103 mEq/L (ref 96–112)
Creatinine, Ser: 1.2 mg/dL — ABNORMAL HIGH (ref 0.50–1.10)
GFR calc Af Amer: 53 mL/min — ABNORMAL LOW (ref >90)
GFR calc non Af Amer: 45 mL/min — ABNORMAL LOW (ref >90)
Glucose, Bld: 99 mg/dL (ref 70–99)
Potassium: 4.3 mEq/L (ref 3.7–5.3)
Sodium: 141 mEq/L (ref 137–147)

## 2013-10-04 LAB — CBC
HCT: 36 % (ref 36.0–46.0)
Hemoglobin: 11.9 g/dL — ABNORMAL LOW (ref 12.0–15.0)
MCH: 28.2 pg (ref 26.0–34.0)
MCHC: 33.1 g/dL (ref 30.0–36.0)
MCV: 85.3 fL (ref 78.0–100.0)
Platelets: 360 10*3/uL (ref 150–400)
RBC: 4.22 MIL/uL (ref 3.87–5.11)
RDW: 13.5 % (ref 11.5–15.5)
WBC: 10.7 10*3/uL — ABNORMAL HIGH (ref 4.0–10.5)

## 2013-10-04 MED ORDER — POLYETHYLENE GLYCOL 3350 17 G PO PACK
17.0000 g | PACK | Freq: Every day | ORAL | Status: DC
  Administered 2013-10-04 – 2013-10-08 (×5): 17 g via ORAL
  Filled 2013-10-04 (×6): qty 1

## 2013-10-04 MED ORDER — SODIUM CHLORIDE 0.9 % IV SOLN
1.0000 g | INTRAVENOUS | Status: DC
  Administered 2013-10-04 – 2013-10-14 (×11): 1 g via INTRAVENOUS
  Filled 2013-10-04 (×14): qty 1

## 2013-10-04 NOTE — Progress Notes (Signed)
Subjective: She still has discomfort deep in RLQ.  Would like to move diet up, but  Waiting on Dr. Dalbert Batman.  Objective: Vital signs in last 24 hours: Temp:  [97.9 F (36.6 C)-98.4 F (36.9 C)] 97.9 F (36.6 C) (01/21 0535) Pulse Rate:  [82-90] 90 (01/21 0535) Resp:  [16-18] 18 (01/21 0535) BP: (120-137)/(79-94) 122/81 mmHg (01/21 0535) SpO2:  [97 %-98 %] 98 % (01/21 0535) Last BM Date: 10/01/13 1140 Po recorded;  Full liquid diet Afebrile, VSS Creatinine is 1.2,  WBC is up some to 10.7 Day 7 of Zosyn CT scan yesterday:  . The pelvic collections of gas and fluid particularly along the cul-de-sac from acute diverticulitis have reduced in size compared to the prior diagnostic CT of 09/27/2013. I do note that oral contrast, which is relatively concentrated in the sigmoid colon on today's exam, does appear to extend into these extraluminal connect collections, indicating continuity with the sigmoid colon. These collections are probably draining through a fistulous connection into the sigmoid colon.   Intake/Output from previous day: 01/20 0701 - 01/21 0700 In: 2127.9 [P.O.:1140; I.V.:887.9; IV Piggyback:100] Out: 2250 [Urine:2250] Intake/Output this shift: Total I/O In: -  Out: 300 [Urine:300]  General appearance: alert, cooperative and no distress GI: soft, non-tender; bowel sounds normal; no masses,  no organomegaly and sore in RLQ with deep palaption.  Lab Results:   Recent Labs  10/02/13 0446 10/04/13 0531  WBC 7.1 10.7*  HGB 11.0* 11.9*  HCT 33.1* 36.0  PLT 341 360    BMET  Recent Labs  10/02/13 0446 10/04/13 0531  NA 140 141  K 4.5 4.3  CL 104 103  CO2 22 23  GLUCOSE 96 99  BUN 4* 6  CREATININE 1.14* 1.20*  CALCIUM 9.9 10.1   PT/INR No results found for this basename: LABPROT, INR,  in the last 72 hours  No results found for this basename: AST, ALT, ALKPHOS, BILITOT, PROT, ALBUMIN,  in the last 168 hours   Lipase     Component Value Date/Time    LIPASE 24 09/11/2013 2240     Studies/Results: Ct Abdomen Pelvis W Contrast  10/03/2013   CLINICAL DATA:  Lower abdominal pain. Recent diverticulitis with pelvic abscess.  EXAM: CT ABDOMEN AND PELVIS WITH CONTRAST  TECHNIQUE: Multidetector CT imaging of the abdomen and pelvis was performed using the standard protocol following bolus administration of intravenous contrast.  CONTRAST:  168m OMNIPAQUE IOHEXOL 300 MG/ML  SOLN  COMPARISON:  CT PELVIS W/O CM dated 09/29/2013; CT ABD/PELVIS W CM dated 09/27/2013; CT ABD/PELVIS W CM dated 09/17/2013; CT ABD/PELVIS W CM dated 09/11/2013  FINDINGS: Diffuse hepatic steatosis. Intrahepatic biliary dilatation with common bile duct measuring approximately 7 mm (formerly 8 mm).  Pancreas and adrenal glands unremarkable. Stable 8 mm splenic hypodensity, image 13 of series 2.  Scattered small hypodense lesions in the kidneys, likely cysts but technically too small to characterize.  Small retroperitoneal lymph nodes are not pathologically enlarged by size criteria.  There is a small diverticulum of the transverse duodenum, without surrounding inflammatory findings.  Appendix normal.  Terminal ileum unremarkable.  There is diffuse wall thickening of the sigmoid colon with scattered sigmoid colon diverticula. Extraluminal gas in the pelvic cul-de-sac region noted with several small locules of gas and fluid on images 70-75 of series 2, and a string of gas locules along the posterior uterine margin slightly reduced in conspicuity compared to the prior diagnostic CT scan. There appears to be oral contrast in this  extraluminal gas and fluid, suggesting a continued connection/ fistula to the sigmoid colon.  Previously gas extended up towards the right ovary ; this appears to have resolved although continued inflammatory involvement in the vicinity of the right ovary is not completely excluded.  Urinary bladder unremarkable. There is degenerative disc disease at L4-5 and L5-S1.  Low-grade presacral edema.  IMPRESSION: 1. The pelvic collections of gas and fluid particularly along the cul-de-sac from acute diverticulitis have reduced in size compared to the prior diagnostic CT of 09/27/2013. I do note that oral contrast, which is relatively concentrated in the sigmoid colon on today's exam, does appear to extend into these extraluminal connect collections, indicating continuity with the sigmoid colon. These collections are probably draining through a fistulous connection into the sigmoid colon. 2. The gas density previously tracking along the right ovary has resolved, although some residual inflammatory involvement of the right ovary is not completely excluded. 3. Other incidental stable findings as noted above.   Electronically Signed   By: Sherryl Barters M.D.   On: 10/03/2013 13:29    Medications: . chlorhexidine  15 mL Mouth/Throat BID  . feeding supplement (ENSURE)  1 Container Oral TID BM  . feeding supplement (RESOURCE BREEZE)  1 Container Oral BID BM  . heparin subcutaneous  5,000 Units Subcutaneous Q8H  . levothyroxine  125 mcg Oral QAC breakfast  . piperacillin-tazobactam (ZOSYN)  IV  3.375 g Intravenous Q8H    Assessment/Plan 1. Diverticulitis with abscess. Admitted 09/10/2013 - 09/21/2013 for diverticulitis, but then was readmitted Friday, 09/30/2103 for recurrent disease.  Went to IR 09/29/2013, but because of improvement of abscess - no drain was placed.  WBC - 9,100 - 10/01/2013  On Zosyn - 1/15 >>>>  2. DVT prophylaxis - SQ Heparin  3. HTN  4. Hypothyroid  5. Depression  6. History of Sickle cell trait  7. Hx of DVT in 20's on BCP (computer wrongly has her as having a PE)  Plan:  Will discuss with Dr. Dalbert Batman.   LOS: 6 days    Earnstine Regal 10/04/2013

## 2013-10-04 NOTE — Progress Notes (Signed)
General surgery attending note:  I have interviewed and examined this patient this afternoon, and I agree with the assessment and treatment plan outlined by Mr. Creig Hines, [place].  She states she feels about the same. She is begging for a regular diet. She still having stools and passing flatus, but her stools are somewhat disordered, not well formed. She says she has no pain when she had relates that she says she has no pain at rest. She only has pain during palpation.  CT scan shows some luminal air bubbles but no significant abscess. I reviewed this with the radiologist yesterday.  Exam reveals her abdomen is quite soft and only tender in the suprapubic area, left lower quadrant right lower quadrant on deep palpation. No guarding or mass.  Assessment/plan: Acute diverticulitis, rectosigmoid colon with extraluminal air bubbles. Micro-perforation almost certain.  This is her second hospitalization within one month for this problem. I told her this almost certainly means she will eventually need a resection. We had a long discussion about this. It is possible that this episode will resolve allowing an one stage resection following elective bowel prep and readmission. It is also possible that she may not become asymptomatic and may require resection during this admission which may involve either colostomy or diverting loop ileostomy. She is totally opposed to a loop ileostomy because of her nursing history. She is afraid of skin necrosis and dehydration. Although she does not want a colostomy, she says she would much prefer that to a diverting loop ileostomy following the anastomosis. I told her that was reasonable and that we would not perform an ileostomy unless it was a life or death situation.  Today, I will change her antibiotics from Zosyn to [facility], advance her to a low-residue diet, start daily MiraLAX. We will see how she does.   Edsel Petrin. Dalbert Batman, M.D., [facility] General and Minimally invasive Surgery Breast and Colorectal Surgery Office:   [phone] Pager:   [phone]

## 2013-10-05 NOTE — Progress Notes (Addendum)
General surgery attending note:  Patient is doing better. Had 2 bowel movements which were soft. Tolerating low residue diet and tolerating MiraLAX without cramps or nausea. Fully ambulatory.  On exam, abdomen is soft and flat and essentially benign. The only findings are subjective tenderness to deep palpation in the suprapubic area. This seems much better.  Assess/Plan: Acute diverticulitis, rectosigmoid colon with extraluminal air bubbles. Microperforation  without significant abscess.  This is her second hospitalization within one month for this problem. I told her this almost certainly means she will eventually need a resection.    She is totally opposed to a loop ileostomy because of her nursing history. She is afraid of skin necrosis and dehydration. Although she does not want a colostomy, she says she would much prefer that to a diverting loop ileostomy following the anastomosis. I told her that was reasonable and that we would not perform an ileostomy unless it was a life or death situation.   Today, we will continue the IV Invanz and she has she does. Check CBC tomorrow.   Edsel Petrin. Dalbert Batman, M.D., [facility] General and Minimally invasive Surgery Breast and Colorectal Surgery Office:   [phone] Pager:   [phone]

## 2013-10-05 NOTE — Progress Notes (Addendum)
Due to patient request bed was not placed in the lowest position.  Patient is alert and oriented. Nurse was notified. Velora Heckler, SN [facility]

## 2013-10-06 LAB — CBC
HCT: 33.6 % — ABNORMAL LOW (ref 36.0–46.0)
Hemoglobin: 11 g/dL — ABNORMAL LOW (ref 12.0–15.0)
MCH: 28.4 pg (ref 26.0–34.0)
MCHC: 32.7 g/dL (ref 30.0–36.0)
MCV: 86.8 fL (ref 78.0–100.0)
Platelets: 287 10*3/uL (ref 150–400)
RBC: 3.87 MIL/uL (ref 3.87–5.11)
RDW: 13.8 % (ref 11.5–15.5)
WBC: 7.8 10*3/uL (ref 4.0–10.5)

## 2013-10-06 NOTE — Progress Notes (Signed)
  Subjective: She feels better, less discomfort. Tolerating PO's.    Objective: Vital signs in last 24 hours: Temp:  [97.2 F (36.2 C)-97.9 F (36.6 C)] 97.9 F (36.6 C) (01/23 0602) Pulse Rate:  [72-81] 72 (01/23 0602) Resp:  [18] 18 (01/23 0602) BP: (138-163)/(80-102) 138/93 mmHg (01/23 0602) SpO2:  [98 %] 98 % (01/23 0602) Last BM Date: 10/05/13 Low fiber diet Labs OK WBC 7.8 Intake/Output from previous day: 01/22 0701 - 01/23 0700 In: 1073.8 [P.O.:120; I.V.:903.8; IV Piggyback:50] Out: 3200 [Urine:3200] Intake/Output this shift: Total I/O In: -  Out: 450 [Urine:450]  General appearance: alert, cooperative and no distress GI: soft, non-tender; bowel sounds normal; no masses,  no organomegaly  Lab Results:   Recent Labs  10/04/13 0531 10/06/13 0552  WBC 10.7* 7.8  HGB 11.9* 11.0*  HCT 36.0 33.6*  PLT 360 287    BMET  Recent Labs  10/04/13 0531  NA 141  K 4.3  CL 103  CO2 23  GLUCOSE 99  BUN 6  CREATININE 1.20*  CALCIUM 10.1   PT/INR No results found for this basename: LABPROT, INR,  in the last 72 hours  No results found for this basename: AST, ALT, ALKPHOS, BILITOT, PROT, ALBUMIN,  in the last 168 hours   Lipase     Component Value Date/Time   LIPASE 24 09/11/2013 2240     Studies/Results: No results found.  Medications: . chlorhexidine  15 mL Mouth/Throat BID  . ertapenem  1 g Intravenous Q24H  . feeding supplement (ENSURE)  1 Container Oral TID BM  . feeding supplement (RESOURCE BREEZE)  1 Container Oral BID BM  . heparin subcutaneous  5,000 Units Subcutaneous Q8H  . levothyroxine  125 mcg Oral QAC breakfast  . polyethylene glycol  17 g Oral Daily    Assessment/Plan 1. Diverticulitis with abscess. Admitted 09/10/2013 - 09/21/2013 for diverticulitis, but then was readmitted Friday, 09/30/2103 for recurrent disease.  Went to IR 09/29/2013, but because of improvement of abscess - no drain was placed.  WBC - 9,100 - 10/01/2013  On Zosyn  - 1/15 >>>>  2. DVT prophylaxis - SQ Heparin  3. HTN  4. Hypothyroid  5. Depression  6. History of Sickle cell trait  7. Hx of DVT in 20's on BCP (computer wrongly has her as having a PE)   Plan:  Our plan currently is to continue IV antibiotics for now and reaccess  Next week.  LOS: 8 days    Valerie Jackson 10/06/2013

## 2013-10-06 NOTE — Progress Notes (Signed)
General surgery attending:  I have personally interviewed and examined this patient this afternoon. I agree with the assessment and treatment plan outlined Mr. Creig Hines, [place].  We are going slow with discharge plans because of her recurrent problems And 2 admissions within one month.. She is slowly but clearly improving on Invanz. We will continue the low-residue diet and daily MiraLAX.  Consider CT scan Monday and discharge home He come 2 weeks of antibiotics and further evaluation as outpatient. She is comfortable with this plan.    Edsel Petrin. Dalbert Batman, M.D., [facility] General and Minimally invasive Surgery Breast and Colorectal Surgery Office:   [phone] Pager:   [phone]

## 2013-10-06 NOTE — Progress Notes (Signed)
Patient continues to refuse ordered Heparin and SCD hose.  She is aware of their use of DVT prevention.  She states understanding and tells this nurse that she walks a lot and dose not need to have heparin or SCD hose.

## 2013-10-07 NOTE — Progress Notes (Signed)
Patient ID: [name], female   DOB: 1946-07-18, 68 y.o.   MRN: [id number]  General Surgery - [facility] - Progress Note  Subjective: Patient without complaint.  Mild pain well controlled.  Tolerating low residue diet.  Objective: Vital signs in last 24 hours: Temp:  [97.4 F (36.3 C)-98.5 F (36.9 C)] 97.4 F (36.3 C) (01/24 0526) Pulse Rate:  [85-88] 85 (01/24 0526) Resp:  [18] 18 (01/24 0526) BP: (135-153)/(82-95) 135/95 mmHg (01/24 0526) SpO2:  [94 %-99 %] 94 % (01/24 0526) Last BM Date: 10/05/13  Intake/Output from previous day: 01/23 0701 - 01/24 0700 In: 720 [P.O.:720] Out: 3900 [Urine:3900]  Exam: HEENT - clear, not icteric Neck - soft Chest - clear bilaterally Cor - RRR, no murmur Abd - soft without distension; BS present; mild tenderness, poorly localized Ext - no significant edema Neuro - grossly intact, no focal deficits  Lab Results:   Recent Labs  10/06/13 0552  WBC 7.8  HGB 11.0*  HCT 33.6*  PLT 287    No results found for this basename: NA, K, CL, CO2, GLUCOSE, BUN, CREATININE, CALCIUM,  in the last 72 hours  Studies/Results: No results found.  Assessment / Plan: 1.  Acute diverticulitis with abscess  IV Invanz  Low residue diet  Plan repeat CT Monday or Tuesday  Earnstine Regal, MD, [facility] Office: [phone]  10/07/2013

## 2013-10-08 MED ORDER — ZOLPIDEM TARTRATE 5 MG PO TABS
5.0000 mg | ORAL_TABLET | Freq: Every evening | ORAL | Status: DC | PRN
  Administered 2013-10-09 – 2013-10-17 (×8): 5 mg via ORAL
  Filled 2013-10-08 (×8): qty 1

## 2013-10-08 NOTE — Progress Notes (Signed)
Patient ID: Valerie Jackson, female   DOB: September 10, 1946, 68 y.o.   MRN: [id number]  General Surgery - [facility] - Progress Note  Subjective: Patient comfortable in bed.  Soft BM's.  Request sleeping pill.  Objective: Vital signs in last 24 hours: Temp:  [97.7 F (36.5 C)-98.1 F (36.7 C)] 97.7 F (36.5 C) (01/25 0535) Pulse Rate:  [79-88] 79 (01/25 0535) Resp:  [16-18] 18 (01/25 0535) BP: (127-147)/(86-101) 147/90 mmHg (01/25 0535) SpO2:  [96 %-97 %] 97 % (01/25 0535) Last BM Date: 10/08/13  Intake/Output from previous day: 01/24 0701 - 01/25 0700 In: 4676.3 [P.O.:1080; I.V.:3596.3] Out: 2850 [Urine:2850]  Exam: HEENT - clear, not icteric Neck - soft Chest - clear bilaterally Cor - RRR, no murmur Abd - soft without distension; mild suprapubic tenderness to palpation; no mass; BS present Ext - no significant edema Neuro - grossly intact, no focal deficits  Lab Results:   Recent Labs  10/06/13 0552  WBC 7.8  HGB 11.0*  HCT 33.6*  PLT 287    No results found for this basename: NA, K, CL, CO2, GLUCOSE, BUN, CREATININE, CALCIUM,  in the last 72 hours  Studies/Results: No results found.  Assessment / Plan: 1.  Acute diverticulitis with abscess  IV abx  Low residue diet  Ambulating  Repeat CT scan tomorrow or Tuesday  Earnstine Regal, MD, [facility] Office: [phone]  10/08/2013

## 2013-10-09 ENCOUNTER — Encounter (HOSPITAL_COMMUNITY): Payer: Self-pay | Admitting: Radiology

## 2013-10-09 ENCOUNTER — Inpatient Hospital Stay (HOSPITAL_COMMUNITY): Payer: Medicare Other

## 2013-10-09 LAB — COMPREHENSIVE METABOLIC PANEL
ALT: 25 U/L (ref 0–35)
AST: 23 U/L (ref 0–37)
Albumin: 3.4 g/dL — ABNORMAL LOW (ref 3.5–5.2)
Alkaline Phosphatase: 97 U/L (ref 39–117)
BUN: 15 mg/dL (ref 6–23)
CO2: 22 mEq/L (ref 19–32)
Calcium: 10 mg/dL (ref 8.4–10.5)
Chloride: 102 mEq/L (ref 96–112)
Creatinine, Ser: 0.96 mg/dL (ref 0.50–1.10)
GFR calc Af Amer: 69 mL/min — ABNORMAL LOW (ref >90)
GFR calc non Af Amer: 59 mL/min — ABNORMAL LOW (ref >90)
Glucose, Bld: 136 mg/dL — ABNORMAL HIGH (ref 70–99)
Potassium: 4.4 mEq/L (ref 3.7–5.3)
Sodium: 140 mEq/L (ref 137–147)
Total Bilirubin: 0.5 mg/dL (ref 0.3–1.2)
Total Protein: 7.8 g/dL (ref 6.0–8.3)

## 2013-10-09 LAB — CBC
HCT: 35.5 % — ABNORMAL LOW (ref 36.0–46.0)
Hemoglobin: 12 g/dL (ref 12.0–15.0)
MCH: 28.9 pg (ref 26.0–34.0)
MCHC: 33.8 g/dL (ref 30.0–36.0)
MCV: 85.5 fL (ref 78.0–100.0)
Platelets: 229 10*3/uL (ref 150–400)
RBC: 4.15 MIL/uL (ref 3.87–5.11)
RDW: 13.7 % (ref 11.5–15.5)
WBC: 8.3 10*3/uL (ref 4.0–10.5)

## 2013-10-09 MED ORDER — ERYTHROMYCIN BASE 250 MG PO TABS
1000.0000 mg | ORAL_TABLET | ORAL | Status: AC
  Administered 2013-10-10 (×3): 1000 mg via ORAL
  Filled 2013-10-09 (×3): qty 4

## 2013-10-09 MED ORDER — POLYETHYLENE GLYCOL 3350 17 G PO PACK
17.0000 g | PACK | Freq: Two times a day (BID) | ORAL | Status: DC
  Administered 2013-10-09 – 2013-10-15 (×8): 17 g via ORAL
  Filled 2013-10-09 (×15): qty 1

## 2013-10-09 MED ORDER — IOHEXOL 300 MG/ML  SOLN
100.0000 mL | Freq: Once | INTRAMUSCULAR | Status: AC | PRN
Start: 2013-10-09 — End: 2013-10-09
  Administered 2013-10-09: 100 mL via INTRAVENOUS

## 2013-10-09 MED ORDER — NEOMYCIN SULFATE 500 MG PO TABS
1000.0000 mg | ORAL_TABLET | ORAL | Status: AC
  Administered 2013-10-10 (×3): 1000 mg via ORAL
  Filled 2013-10-09 (×3): qty 2

## 2013-10-09 MED ORDER — IOHEXOL 300 MG/ML  SOLN
50.0000 mL | Freq: Once | INTRAMUSCULAR | Status: AC | PRN
  Administered 2013-10-09: 50 mL via ORAL

## 2013-10-09 NOTE — Progress Notes (Signed)
  Subjective: She is eating, low fiber, having soft BM's, but still has same deep RLQ discomfort she had last week at this time.  Objective: Vital signs in last 24 hours: Temp:  [97.9 F (36.6 C)-98.2 F (36.8 C)] 97.9 F (36.6 C) (01/26 0625) Pulse Rate:  [82-90] 82 (01/26 0625) Resp:  [18] 18 (01/26 0625) BP: (127-147)/(81-97) 127/81 mmHg (01/26 0625) SpO2:  [95 %-97 %] 97 % (01/26 0625) Last BM Date: 10/08/13 Low fiber diet, + BM x 2 .Readmitted 1/8 with 13 days of Zosyn Changed to Invanz 10/04/13 today will be day 6 of Invanz Afebrile, VSS BP up some  Last labs 10/06/13 Intake/Output from previous day: 01/25 0701 - 01/26 0700 In: 2282.5 [P.O.:480; I.V.:1802.5] Out: 2400 [Urine:2400] Intake/Output this shift:    General appearance: alert, cooperative and no distress Resp: clear to auscultation bilaterally GI: soft, tender deep RLQ, no real change from last week per the patient..  No distension  no pain just lying around.  Lab Results:  No results found for this basename: WBC, HGB, HCT, PLT,  in the last 72 hours  BMET No results found for this basename: NA, K, CL, CO2, GLUCOSE, BUN, CREATININE, CALCIUM,  in the last 72 hours PT/INR No results found for this basename: LABPROT, INR,  in the last 72 hours  No results found for this basename: AST, ALT, ALKPHOS, BILITOT, PROT, ALBUMIN,  in the last 168 hours   Lipase     Component Value Date/Time   LIPASE 24 09/11/2013 2240     Studies/Results: No results found.  Medications: . chlorhexidine  15 mL Mouth/Throat BID  . ertapenem  1 g Intravenous Q24H  . feeding supplement (ENSURE)  1 Container Oral TID BM  . feeding supplement (RESOURCE BREEZE)  1 Container Oral BID BM  . heparin subcutaneous  5,000 Units Subcutaneous Q8H  . levothyroxine  125 mcg Oral QAC breakfast  . polyethylene glycol  17 g Oral Daily    Assessment/Plan 1. Diverticulitis with abscess. Admitted 09/10/2013 - 09/21/2013 for diverticulitis, but  then was readmitted Friday, 09/30/2103 for recurrent disease.  Went to IR 09/29/2013, but because of improvement of abscess - no drain was placed.  WBC - 9,100 - 10/01/2013  On Zosyn - 1/15 >>>>  2. DVT prophylaxis - SQ Heparin  3. HTN  4. Hypothyroid  5. Depression  6. History of Sickle cell trait  7. Hx of DVT in 20's on BCP (computer wrongly has her as having a PE)   Plan:  Repeat labs, and CT this AM.   LOS: 11 days    Arash Karstens 10/09/2013

## 2013-10-09 NOTE — Progress Notes (Signed)
Pt continues to have tenderness despite 11 days of IV antibiotics.  She is not having pain unless she is examined, but on exam has palpable colon and tenderness.  I think she would greatly benefit from resection of affected bowel.  I discussed that I will remove sigmoid and make intraoperative decision whether bowel is safe to reconnect.  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  She will consent to colostomy.  I advised her that that is the standard that I would perform.  I also reviewed the operation and that taking ostomy down is another surgery.  We will tentatively plan for Wednesday.

## 2013-10-10 NOTE — Progress Notes (Signed)
Subjective: Continued pain despite 6 days of Invanz and prior to that zosyn.  Understands at this point she needs to undergo surgery to get her better and that may mean an ostomy  Objective: Vital signs in last 24 hours: Temp:  [97.4 F (36.3 C)-97.7 F (36.5 C)] 97.7 F (36.5 C) (01/27 0532) Pulse Rate:  [76-93] 90 (01/27 0532) Resp:  [18] 18 (01/27 0532) BP: (110-131)/(66-89) 116/79 mmHg (01/27 0532) SpO2:  [95 %-99 %] 97 % (01/27 0532) Last BM Date: 10/09/13  Intake/Output from previous day: 01/26 0701 - 01/27 0700 In: 2997.5 [P.O.:1200; I.V.:1797.5] Out: 3450 [Urine:3450] Intake/Output this shift:    PE: Gen:  Alert, NAD, pleasant Abd: Soft, tender in suprapubic and RLQ, ND, +BS, no HSM   Lab Results:   Recent Labs  10/09/13 1015  WBC 8.3  HGB 12.0  HCT 35.5*  PLT 229   BMET  Recent Labs  10/09/13 1015  NA 140  K 4.4  CL 102  CO2 22  GLUCOSE 136*  BUN 15  CREATININE 0.96  CALCIUM 10.0   PT/INR No results found for this basename: LABPROT, INR,  in the last 72 hours CMP     Component Value Date/Time   NA 140 10/09/2013 1015   K 4.4 10/09/2013 1015   CL 102 10/09/2013 1015   CO2 22 10/09/2013 1015   GLUCOSE 136* 10/09/2013 1015   BUN 15 10/09/2013 1015   CREATININE 0.96 10/09/2013 1015   CALCIUM 10.0 10/09/2013 1015   PROT 7.8 10/09/2013 1015   ALBUMIN 3.4* 10/09/2013 1015   AST 23 10/09/2013 1015   ALT 25 10/09/2013 1015   ALKPHOS 97 10/09/2013 1015   BILITOT 0.5 10/09/2013 1015   GFRNONAA 59* 10/09/2013 1015   GFRAA 69* 10/09/2013 1015   Lipase     Component Value Date/Time   LIPASE 24 09/11/2013 2240       Studies/Results: Ct Abdomen Pelvis W Contrast  10/09/2013   CLINICAL DATA:  Persistent lower abdominal pain.  Diverticulitis.  EXAM: CT ABDOMEN AND PELVIS WITH CONTRAST  TECHNIQUE: Multidetector CT imaging of the abdomen and pelvis was performed using the standard protocol following bolus administration of intravenous contrast.  CONTRAST:   135m OMNIPAQUE IOHEXOL 300 MG/ML  SOLN  COMPARISON:  10/03/2013  FINDINGS: Diffuse hepatic steatosis again noted. Mild intrahepatic biliary duct dilatation is stable. Extrahepatic common duct measures 7-8 mm. Gallbladder is surgically absent.  No focal abnormality is seen in the liver. 8 mm hypodensity in the upper spleen is unchanged as is the 4 mm hypodensity in the inferior spleen. These are nonspecific, but stable in the interval. Small hiatal hernia noted. Stomach, duodenum, pancreas, and adrenal glands are unremarkable.  Kidneys show areas of focal cortical scarring bilaterally. Scattered hypo attenuating lesions within the parenchyma of each kidney are too small to characterize but have not changed substantially these likely represent cysts.  No abdominal aortic aneurysm. No free fluid or lymphadenopathy in the abdomen.  Imaging through the pelvis again shows advanced diverticular change with wall thickening in the sigmoid colon. There is pericolonic edema/ inflammation but this appears stable to slightly decreased in the interval. There is no evidence for a discrete or drainable pericolonic fluid collection at this time. The terminal ileum is normal. The appendix is not visualized, the appendix is normal.  There does appear to be some trace fluid in the endometrial cavity of the uterus which would be abnormal in a postmenopausal female. No adnexal mass.  Bone windows  reveal no worrisome lytic or sclerotic osseous lesions.  IMPRESSION: Stable to slight improvement in pericolonic edema/ inflammation and wall thickening in the sigmoid colon. Imaging features remain compatible with diverticulitis. There is no pericolonic abscess at this time.  Question trace fluid in the endometrial cavity. To be abnormal in a postmenopausal female. Consider pelvic ultrasound after resolution of acute symptoms to further evaluate.   Electronically Signed   By: Misty Stanley M.D.   On: 10/09/2013 16:19     Anti-infectives: Anti-infectives   Start     Dose/Rate Route Frequency Ordered Stop   10/10/13 1700  neomycin (MYCIFRADIN) tablet 1,000 mg     1,000 mg Oral 3 times per day on Tue 10/09/13 1629 10/17/13 1659   10/10/13 1700  erythromycin (E-MYCIN) tablet 1,000 mg     1,000 mg Oral 3 times per day on Tue 10/09/13 1629 10/17/13 1659   10/04/13 1400  ertapenem (INVANZ) 1 g in sodium chloride 0.9 % 50 mL IVPB    Comments:  First dose now.   1 g 100 mL/hr over 30 Minutes Intravenous Every 24 hours 10/04/13 1224     09/29/13 2200  fluconazole (DIFLUCAN) tablet 150 mg     150 mg Oral  Once 09/29/13 0708 09/29/13 2118   09/29/13 0200  piperacillin-tazobactam (ZOSYN) IVPB 3.375 g  Status:  Discontinued     3.375 g 12.5 mL/hr over 240 Minutes Intravenous Every 8 hours 09/28/13 1743 10/04/13 1224   09/28/13 2000  fluconazole (DIFLUCAN) tablet 150 mg     150 mg Oral  Once 09/28/13 1842 09/28/13 2127   09/28/13 1800  piperacillin-tazobactam (ZOSYN) IVPB 3.375 g     3.375 g 12.5 mL/hr over 240 Minutes Intravenous  Once 09/28/13 1650 09/28/13 2249       Assessment/Plan 1. Sigmoid diverticulitis with abscess. Admitted 09/10/2013 - 09/21/2013 for diverticulitis, but then was readmitted Friday, 09/30/2103 for recurrent disease.  Went to IR 09/29/2013, but because of improvement of abscess - no drain was placed.  WBC - 8.3 on 1/26 On Zosyn - 1/15 >>>> 1/21 On Invanz - 1/21 >>>>  2. DVT prophylaxis - SQ Heparin  3. HTN  4. Hypothyroid  5. Depression  6. History of Sickle cell trait  7. Hx of DVT in 20's on BCP (computer wrongly has her as having a PE)   Plan:  1.  Repeat labs normal 2.  CT shows stable to only slightly improvement 3.  Will plan for OR tomorrow for open sigmoid colectomy possible ostomy 4.  Pre op miralax, neomycin and erythromycin for prep 5.  Consent form, hold heparin after midnight     LOS: 12 days    DORT, Johnna Bollier 10/10/2013, 8:03 AM Pager: [phone]

## 2013-10-10 NOTE — Progress Notes (Signed)
Seen, agree with above. Pt marked for ostomy Plan ex lap, sigmoid colectomy, possible ostomy vs primary anastomosis.

## 2013-10-10 NOTE — Consult Note (Signed)
WOC ostomy consult note: Preoperative Stoma Site Selection per Dr. Marlowe Aschoff request Patient seen per Dr. Marlowe Aschoff request for stoma site selection in anticipation of surgery tomorrow.  MD note appreciated and see that patient does not wish to have an ileostomy.  Marked for a colostomy today.  Abdomen seen in the sitting, standing and lying position.  Site selected is within the abdominal rectus muscle and 6.5cm to the left of the umbilicus and 2cm below.  A skin marking pen is used to mark and the mark is covered with a thin film adhesive dressing. Education provided: Patient assured that [place] nurse would be available after surgery in the event a stoma is created to assist with and direct ostomy care. Patient understands the role of the [place] nurse. [place] nursing team will follow in the event of stoma creation, and will remain available to this patient, the nursing and surgical teams.  Please re-consult if needed. Thanks, Maudie Flakes, MSN, RN, [place], [place], [place] [phone])

## 2013-10-10 NOTE — Progress Notes (Signed)
NUTRITION FOLLOW UP  Intervention:   - Continue Resource Breeze BID - Recommend MD allow pt to have double portions once diet advanced to low fiber. Encouraged high protein meal/beverage selections.  - Will continue to monitor   Nutrition Dx:   Inadequate oral intake related to diverticular disease as evidenced by wt loss - improving    Goal:   Pt to meet >/= 90% of their estimated nutrition needs - not met    Monitor:   Weights, labs, diet advancement, abdominal pain  Assessment:   68 year old female recently hospitalized with acute diverticulitis with abscess formation. Patient was treated with bowel rest and intravenous antibiotics. She was discharged home 1 week ago on oral Augmentin.  Patient has continued to have lower abdominal pain. She is eating a very limited diet. She is having loose bowel movements. Patient underwent a CT scan of the abdomen and pelvis on 09/27/2013. This shows persistent acute diverticulitis involving the sigmoid colon.   1/19 - Pt reports that she has lost 25 lbs within the past year. She says that she has lost her taste for many foods. She does not like Ensure Complete, but agreed to try Ensure Pudding. She also had questions about her diet once discharged from the hospital which were answered by RD. She is recommended to follow a low residue diet by her physician.  1/27 - Reviewed events since last RD visit. Diet advanced to low fiber 1/21. Plan is for surgery tomorrow for open sigmoid colectomy and possible ostomy. Met with pt who c/o small portion sizes of foods on low fiber diet. States she does not like the Ensure pudding but has been drinking the [employer]. States she has been eating 50% of meals. C/o abdominal pain on palpitation as well as lots of gas.   Height: Ht Readings from Last 1 Encounters:  09/28/13 5' 6"  (1.676 m)    Weight Status:   Wt Readings from Last 1 Encounters:  09/28/13 182 lb 6.2 oz (82.73 kg)    Re-estimated needs:   Kcal: 1750-1950 Protein: 70-90g Fluid: 1.7-1.9L/day  Skin: Intact  Diet Order: Full Liquid   Intake/Output Summary (Last 24 hours) at 10/10/13 1026 Last data filed at 10/10/13 1000  Gross per 24 hour  Intake   2580 ml  Output   3450 ml  Net   -870 ml    Last BM: 1/26   Labs:   Recent Labs Lab 10/04/13 0531 10/09/13 1015  NA 141 140  K 4.3 4.4  CL 103 102  CO2 23 22  BUN 6 15  CREATININE 1.20* 0.96  CALCIUM 10.1 10.0  GLUCOSE 99 136*    CBG (last 3)  No results found for this basename: GLUCAP,  in the last 72 hours  Scheduled Meds: . chlorhexidine  15 mL Mouth/Throat BID  . ertapenem  1 g Intravenous Q24H  . neomycin  1,000 mg Oral Custom   And  . erythromycin  1,000 mg Oral Custom  . feeding supplement (ENSURE)  1 Container Oral TID BM  . feeding supplement (RESOURCE BREEZE)  1 Container Oral BID BM  . heparin subcutaneous  5,000 Units Subcutaneous Q8H  . levothyroxine  125 mcg Oral QAC breakfast  . polyethylene glycol  17 g Oral BID    Continuous Infusions: . 0.9 % NaCl with KCl 40 mEq / L 75 mL/hr at 10/09/13 [place] MS, [place], [place] Pager [phone] After Hours Pager

## 2013-10-11 ENCOUNTER — Encounter (HOSPITAL_COMMUNITY): Payer: Medicare Other | Admitting: Anesthesiology

## 2013-10-11 ENCOUNTER — Inpatient Hospital Stay (HOSPITAL_COMMUNITY): Payer: Medicare Other | Admitting: Anesthesiology

## 2013-10-11 ENCOUNTER — Encounter (HOSPITAL_COMMUNITY): Admission: AD | Disposition: A | Discharge status: Home Health | Payer: Self-pay | Source: Ambulatory Visit

## 2013-10-11 DIAGNOSIS — K5730 Diverticulosis of large intestine without perforation or abscess without bleeding: Secondary | ICD-10-CM

## 2013-10-11 HISTORY — PX: COLON RESECTION: SHX5231

## 2013-10-11 LAB — SURGICAL PCR SCREEN
MRSA, PCR: NEGATIVE
Staphylococcus aureus: NEGATIVE

## 2013-10-11 SURGERY — COLON RESECTION
Anesthesia: General | Site: Abdomen

## 2013-10-11 MED ORDER — SODIUM CHLORIDE 0.9 % IJ SOLN
INTRAMUSCULAR | Status: AC
  Filled 2013-10-11: qty 10

## 2013-10-11 MED ORDER — BUPIVACAINE 0.25 % ON-Q PUMP DUAL CATH 300 ML
300.0000 mL | INJECTION | Status: DC
  Administered 2013-10-11: 300 mL
  Filled 2013-10-11: qty 300

## 2013-10-11 MED ORDER — HYDROMORPHONE HCL PF 1 MG/ML IJ SOLN
INTRAMUSCULAR | Status: AC
  Filled 2013-10-11: qty 1

## 2013-10-11 MED ORDER — 0.9 % SODIUM CHLORIDE (POUR BTL) OPTIME
TOPICAL | Status: DC | PRN
  Administered 2013-10-11: 5000 mL

## 2013-10-11 MED ORDER — LACTATED RINGERS IV SOLN: INTRAVENOUS | Status: DC

## 2013-10-11 MED ORDER — SENSORCAINE 0.25 % 300ML FOR PAIN PUMP OPTIME: 300.0000 mL | INTRAMUSCULAR | Status: DC

## 2013-10-11 MED ORDER — DEXAMETHASONE SODIUM PHOSPHATE 10 MG/ML IJ SOLN
INTRAMUSCULAR | Status: AC
  Filled 2013-10-11: qty 1

## 2013-10-11 MED ORDER — FENTANYL CITRATE 0.05 MG/ML IJ SOLN
INTRAMUSCULAR | Status: AC
  Filled 2013-10-11: qty 5

## 2013-10-11 MED ORDER — ROCURONIUM BROMIDE 100 MG/10ML IV SOLN
INTRAVENOUS | Status: DC | PRN
  Administered 2013-10-11 (×3): 10 mg via INTRAVENOUS
  Administered 2013-10-11: 30 mg via INTRAVENOUS

## 2013-10-11 MED ORDER — ONDANSETRON HCL 4 MG/2ML IJ SOLN
INTRAMUSCULAR | Status: DC | PRN
  Administered 2013-10-11: 4 mg via INTRAVENOUS

## 2013-10-11 MED ORDER — PROPOFOL 10 MG/ML IV BOLUS
INTRAVENOUS | Status: AC
  Filled 2013-10-11: qty 20

## 2013-10-11 MED ORDER — FENTANYL CITRATE 0.05 MG/ML IJ SOLN
INTRAMUSCULAR | Status: DC | PRN
  Administered 2013-10-11: 50 ug via INTRAVENOUS
  Administered 2013-10-11: 100 ug via INTRAVENOUS
  Administered 2013-10-11 (×6): 50 ug via INTRAVENOUS

## 2013-10-11 MED ORDER — HYDROMORPHONE HCL PF 1 MG/ML IJ SOLN
0.2500 mg | INTRAMUSCULAR | Status: DC | PRN
  Administered 2013-10-11 (×3): 0.5 mg via INTRAVENOUS

## 2013-10-11 MED ORDER — SODIUM CHLORIDE 0.9 % IV SOLN
INTRAVENOUS | Status: AC
  Filled 2013-10-11: qty 1

## 2013-10-11 MED ORDER — LACTATED RINGERS IV SOLN
INTRAVENOUS | Status: DC | PRN
  Administered 2013-10-11 (×3): via INTRAVENOUS

## 2013-10-11 MED ORDER — GLYCOPYRROLATE 0.2 MG/ML IJ SOLN
INTRAMUSCULAR | Status: AC
  Filled 2013-10-11: qty 3

## 2013-10-11 MED ORDER — FENTANYL CITRATE 0.05 MG/ML IJ SOLN
INTRAMUSCULAR | Status: AC
  Filled 2013-10-11: qty 2

## 2013-10-11 MED ORDER — MIDAZOLAM HCL 5 MG/5ML IJ SOLN
INTRAMUSCULAR | Status: DC | PRN
  Administered 2013-10-11: 2 mg via INTRAVENOUS

## 2013-10-11 MED ORDER — EPHEDRINE SULFATE 50 MG/ML IJ SOLN
INTRAMUSCULAR | Status: AC
  Filled 2013-10-11: qty 1

## 2013-10-11 MED ORDER — PROMETHAZINE HCL 25 MG/ML IJ SOLN
INTRAMUSCULAR | Status: AC
  Filled 2013-10-11: qty 1

## 2013-10-11 MED ORDER — NEOSTIGMINE METHYLSULFATE 1 MG/ML IJ SOLN
INTRAMUSCULAR | Status: DC | PRN
  Administered 2013-10-11: 4 mg via INTRAVENOUS

## 2013-10-11 MED ORDER — EPHEDRINE SULFATE 50 MG/ML IJ SOLN
INTRAMUSCULAR | Status: DC | PRN
  Administered 2013-10-11: 10 mg via INTRAVENOUS

## 2013-10-11 MED ORDER — NEOSTIGMINE METHYLSULFATE 1 MG/ML IJ SOLN
INTRAMUSCULAR | Status: AC
  Filled 2013-10-11: qty 10

## 2013-10-11 MED ORDER — ONDANSETRON HCL 4 MG/2ML IJ SOLN
INTRAMUSCULAR | Status: AC
  Filled 2013-10-11: qty 2

## 2013-10-11 MED ORDER — GLYCOPYRROLATE 0.2 MG/ML IJ SOLN
INTRAMUSCULAR | Status: DC | PRN
  Administered 2013-10-11: 0.6 mg via INTRAVENOUS

## 2013-10-11 MED ORDER — PROMETHAZINE HCL 25 MG/ML IJ SOLN
6.2500 mg | INTRAMUSCULAR | Status: DC | PRN
  Administered 2013-10-11: 12.5 mg via INTRAVENOUS

## 2013-10-11 MED ORDER — MIDAZOLAM HCL 2 MG/2ML IJ SOLN
INTRAMUSCULAR | Status: AC
Start: 2013-10-11 — End: 2013-10-11
  Filled 2013-10-11: qty 2

## 2013-10-11 MED ORDER — PROPOFOL 10 MG/ML IV BOLUS
INTRAVENOUS | Status: DC | PRN
  Administered 2013-10-11: 120 mg via INTRAVENOUS

## 2013-10-11 MED ORDER — ROCURONIUM BROMIDE 100 MG/10ML IV SOLN
INTRAVENOUS | Status: AC
  Filled 2013-10-11: qty 1

## 2013-10-11 MED ORDER — DEXAMETHASONE SODIUM PHOSPHATE 10 MG/ML IJ SOLN
INTRAMUSCULAR | Status: DC | PRN
  Administered 2013-10-11: 10 mg via INTRAVENOUS

## 2013-10-11 MED ORDER — SUCCINYLCHOLINE CHLORIDE 20 MG/ML IJ SOLN
INTRAMUSCULAR | Status: DC | PRN
  Administered 2013-10-11: 100 mg via INTRAVENOUS

## 2013-10-11 SURGICAL SUPPLY — 91 items
BANDAGE GAUZE ELAST BULKY 4 IN (GAUZE/BANDAGES/DRESSINGS) ×3 IMPLANT
BENZOIN TINCTURE PRP APPL 2/3 (GAUZE/BANDAGES/DRESSINGS) IMPLANT
BLADE EXTENDED COATED 6.5IN (ELECTRODE) ×3 IMPLANT
BLADE HEX COATED 2.75 (ELECTRODE) ×6 IMPLANT
BLADE SURG SZ10 CARB STEEL (BLADE) ×6 IMPLANT
CANISTER SUCTION 2500CC (MISCELLANEOUS) ×3 IMPLANT
CATH KIT ON Q 7.5IN SLV (PAIN MANAGEMENT) IMPLANT
CATH KIT ON-Q SILVERSOAK 5IN (CATHETERS) ×6 IMPLANT
CELLS DAT CNTRL 66122 CELL SVR (MISCELLANEOUS) IMPLANT
COUNTER NEEDLE 20 DBL MAG RED (NEEDLE) ×3 IMPLANT
COVER MAYO STAND STRL (DRAPES) ×6 IMPLANT
DECANTER SPIKE VIAL GLASS SM (MISCELLANEOUS) ×3 IMPLANT
DERMABOND ADVANCED (GAUZE/BANDAGES/DRESSINGS)
DERMABOND ADVANCED .7 DNX12 (GAUZE/BANDAGES/DRESSINGS) IMPLANT
DRAIN CHANNEL 19F RND (DRAIN) IMPLANT
DRAPE LAPAROSCOPIC ABDOMINAL (DRAPES) IMPLANT
DRAPE LG THREE QUARTER DISP (DRAPES) ×3 IMPLANT
DRAPE TOWEL STR TPT 18X26 WHT (DRAPES) IMPLANT
DRAPE UTILITY XL STRL (DRAPES) ×6 IMPLANT
DRAPE WARM FLUID 44X44 (DRAPE) ×3 IMPLANT
DRSG OPSITE POSTOP 4X10 (GAUZE/BANDAGES/DRESSINGS) IMPLANT
DRSG OPSITE POSTOP 4X6 (GAUZE/BANDAGES/DRESSINGS) IMPLANT
DRSG OPSITE POSTOP 4X8 (GAUZE/BANDAGES/DRESSINGS) IMPLANT
DRSG TEGADERM 4X4.75 (GAUZE/BANDAGES/DRESSINGS) IMPLANT
ELECT REM PT RETURN 9FT ADLT (ELECTROSURGICAL) ×3
ELECTRODE REM PT RTRN 9FT ADLT (ELECTROSURGICAL) ×2 IMPLANT
ENDOLOOP SUT PDS II  0 18 (SUTURE)
ENDOLOOP SUT PDS II 0 18 (SUTURE) IMPLANT
GLOVE BIO SURGEON STRL SZ 6 (GLOVE) ×6 IMPLANT
GLOVE INDICATOR 6.5 STRL GRN (GLOVE) ×6 IMPLANT
GOWN SPEC L3 XXLG W/TWL (GOWN DISPOSABLE) ×6 IMPLANT
GOWN STRL REIN 2XL LVL4 (GOWN DISPOSABLE) ×6 IMPLANT
GOWN STRL REUS W/TWL 2XL LVL3 (GOWN DISPOSABLE) ×6 IMPLANT
GOWN STRL REUS W/TWL XL LVL3 (GOWN DISPOSABLE) ×12 IMPLANT
KIT BASIN OR (CUSTOM PROCEDURE TRAY) ×3 IMPLANT
LEGGING LITHOTOMY PAIR STRL (DRAPES) IMPLANT
LIGASURE IMPACT 36 18CM CVD LR (INSTRUMENTS) ×3 IMPLANT
LUBRICANT JELLY K Y 4OZ (MISCELLANEOUS) IMPLANT
PACK GENERAL/GYN (CUSTOM PROCEDURE TRAY) ×3 IMPLANT
PAD ABD 8X10 STRL (GAUZE/BANDAGES/DRESSINGS) ×6 IMPLANT
PENCIL BUTTON HOLSTER BLD 10FT (ELECTRODE) ×3 IMPLANT
RETRACTOR WND ALEXIS 25 LRG (MISCELLANEOUS) ×2 IMPLANT
RTRCTR WOUND ALEXIS 18CM MED (MISCELLANEOUS)
RTRCTR WOUND ALEXIS 25CM LRG (MISCELLANEOUS) ×3
SCALPEL HARMONIC ACE (MISCELLANEOUS) IMPLANT
SCISSORS LAP 5X35 DISP (ENDOMECHANICALS) IMPLANT
SET IRRIG TUBING LAPAROSCOPIC (IRRIGATION / IRRIGATOR) IMPLANT
SHEET LAVH (DRAPES) ×3 IMPLANT
SLEEVE SURGEON STRL (DRAPES) IMPLANT
SOLUTION ANTI FOG 6CC (MISCELLANEOUS) IMPLANT
SPONGE GAUZE 4X4 12PLY (GAUZE/BANDAGES/DRESSINGS) IMPLANT
SPONGE LAP 18X18 X RAY DECT (DISPOSABLE) ×6 IMPLANT
STAPLER CUT CVD 40MM BLUE (STAPLE) ×3 IMPLANT
STAPLER PROXIMATE 75MM BLUE (STAPLE) ×3 IMPLANT
STAPLER VISISTAT 35W (STAPLE) ×3 IMPLANT
STRIP CLOSURE SKIN 1/2X4 (GAUZE/BANDAGES/DRESSINGS) IMPLANT
SUCTION POOLE TIP (SUCTIONS) ×3 IMPLANT
SUT MNCRL AB 4-0 PS2 18 (SUTURE) IMPLANT
SUT PDS AB 1 CTX 36 (SUTURE) IMPLANT
SUT PDS AB 1 TP1 96 (SUTURE) ×6 IMPLANT
SUT PROLENE 2 0 CT2 30 (SUTURE) ×3 IMPLANT
SUT PROLENE 2 0 KS (SUTURE) IMPLANT
SUT SILK 2 0 (SUTURE) ×1
SUT SILK 2 0 SH CR/8 (SUTURE) ×3 IMPLANT
SUT SILK 2 0SH CR/8 30 (SUTURE) ×3 IMPLANT
SUT SILK 2-0 18XBRD TIE 12 (SUTURE) ×2 IMPLANT
SUT SILK 3 0 (SUTURE) ×1
SUT SILK 3 0 SH CR/8 (SUTURE) ×3 IMPLANT
SUT SILK 3-0 18XBRD TIE 12 (SUTURE) ×2 IMPLANT
SUT VIC AB 2-0 SH 18 (SUTURE) IMPLANT
SUT VIC AB 3-0 SH 18 (SUTURE) IMPLANT
SUT VIC AB 3-0 SH 8-18 (SUTURE) ×6 IMPLANT
SUT VIC AB 4-0 PS2 27 (SUTURE) IMPLANT
SUT VICRYL 2 0 18  UND BR (SUTURE) ×1
SUT VICRYL 2 0 18 UND BR (SUTURE) ×2 IMPLANT
SUT VICRYL 3 0 BR 18  UND (SUTURE) ×1
SUT VICRYL 3 0 BR 18 UND (SUTURE) ×2 IMPLANT
SYR BULB IRRIGATION 50ML (SYRINGE) ×3 IMPLANT
TOWEL OR 17X26 10 PK STRL BLUE (TOWEL DISPOSABLE) ×6 IMPLANT
TOWEL OR NON WOVEN STRL DISP B (DISPOSABLE) ×6 IMPLANT
TRAY FOLEY CATH 14FRSI W/METER (CATHETERS) ×3 IMPLANT
TRAY LAP CHOLE (CUSTOM PROCEDURE TRAY) ×3 IMPLANT
TROCAR BLADELESS OPT 5 75 (ENDOMECHANICALS) IMPLANT
TROCAR XCEL BLUNT TIP 100MML (ENDOMECHANICALS) IMPLANT
TROCAR XCEL NON-BLD 11X100MML (ENDOMECHANICALS) IMPLANT
TROCAR XCEL UNIV SLVE 11M 100M (ENDOMECHANICALS) IMPLANT
TUBING CONNECTING 10 (TUBING) IMPLANT
TUBING INSUFFLATION 10FT LAP (TUBING) IMPLANT
TUNNELER SHEATH ON-Q 16GX12 DP (PAIN MANAGEMENT) ×3 IMPLANT
WATER STERILE IRR 1500ML POUR (IV SOLUTION) ×3 IMPLANT
YANKAUER SUCT BULB TIP 10FT TU (MISCELLANEOUS) ×3 IMPLANT

## 2013-10-11 NOTE — Transfer of Care (Signed)
Immediate Anesthesia Transfer of Care Note  Patient: Valerie Jackson  Procedure(s) Performed: Procedure(s): EXPLORATORY LAPAROTOMY WITH RECTOSIGMOID COLECTOMY AND END COLOSTOMY (N/A)  Patient Location: PACU  Anesthesia Type:General  Level of Consciousness: sedated and patient cooperative  Airway & Oxygen Therapy: Patient Spontanous Breathing and Patient connected to face mask oxygen  Post-op Assessment: Report given to PACU RN and Post -op Vital signs reviewed and stable  Post vital signs: Reviewed and stable  Complications: No apparent anesthesia complications

## 2013-10-11 NOTE — Progress Notes (Signed)
Patient is NPO, awaiting for surgery.  No concerns/questions.  Anxious for it to be over with.  Still quite tender to palpation in suprapubic area and RLQ.

## 2013-10-11 NOTE — Anesthesia Preprocedure Evaluation (Signed)
Anesthesia Evaluation    Airway Mallampati: II TM Distance: >3 FB Neck ROM: Full    Dental  (+) Caps and Dental Advisory Given,    Pulmonary Current Smoker,  breath sounds clear to auscultation  Pulmonary exam normal       Cardiovascular hypertension, Rhythm:Regular Rate:Normal     Neuro/Psych Depression    GI/Hepatic GERD-  ,Hx of Diverticulitis    Endo/Other  Hypothyroidism   Renal/GU      Musculoskeletal negative musculoskeletal ROS (+)   Abdominal   Peds  Hematology  (+) Sickle cell trait ,   Anesthesia Other Findings Caps covering upper incisors.  Reproductive/Obstetrics                           Anesthesia Physical Anesthesia Plan  ASA: II  Anesthesia Plan: General   Post-op Pain Management:    Induction: Intravenous  Airway Management Planned: Oral ETT  Additional Equipment:   Intra-op Plan:   Post-operative Plan: Extubation in OR  Informed Consent: I have reviewed the patients History and Physical, chart, labs and discussed the procedure including the risks, benefits and alternatives for the proposed anesthesia with the patient or authorized representative who has indicated his/her understanding and acceptance.   Dental advisory given  Plan Discussed with:   Anesthesia Plan Comments:         Anesthesia Quick Evaluation

## 2013-10-11 NOTE — Progress Notes (Signed)
Seen, agree with above. Plan sigmoid resection +/- ostomy.

## 2013-10-11 NOTE — Op Note (Signed)
OPERATIVE REPORT   PREOPERATIVE DIAGNOSIS: Non resolving sigmoid diverticulitis  POSTOPERATIVE DIAGNOSIS: Rectosigmoid diverticulitis with abscess   PROCEDURE PERFORMED: Sigmoid colectomy in conjunction with partial proctectomy with end sigmoid colostomy   SURGEON: Stark Klein, MD   ASSISTANT: Ralene Ok, MD   ANESTHESIA: General.   FINDINGS: Perforated sigmoid colon   ESTIMATED BLOOD LOSS: 150 mL.   COMPLICATIONS: None known.   PROCEDURE:  Patient was identified in the holding area and taken to  the operating room where she was placed supine on the operating room  table. General anesthesia was induced. The abdomen was prepped and  draped in a sterile fashion. Time-out was performed according to the  surgical safety check list. When all was correct we continued.   The midline was incised and the subcutaneous tissues were divided with a  Bovie electrocautery. The fascia was opened in the midline. The sigmoid colon did not immediately appear perforated.  The Bookwalter retractor was used to facilitate  visibility. The white line of Toldt of the descending colon was taken down with the Bovie  Electrocautery.  The GIA stapler was used to fire across the sigmoid proximal and the Ligasure was used to divide the mesocolon high up near the colon wall. The rectum was adherent to the posterior uterus and there was an abscess in between.  The rectum was mobilized below the level of the cervix.  The LigaSure was used to take down the mesorectum.   The rectum was divided with the contour stapler below the abscess and the firm inflamed rectum.  The abdomen was then irrigated copiously.  The right upper quadrant and right abdomen was also irrigated.  Attention was directed to the left abdomen for a location for an  ostomy. Kochers were used to pull the fascia to the midline and an  another Fransisca Kaufmann was used to elevate the skin to create a circular site.  A divot of fatty tissue was taken as  well. The fascia was opened in a  cruciate fashion separating the longitudinal fibers of the rectus. A  lap was placed behind the fascia to ensure that the Bovie could not go  through and puncture any of the intra-abdominal organs. A Kary Kos was  then advanced through the abdominal wall and used to retract the stump  of the sigmoid colon through the abdominal wall.  The  abdomen was then closed using running #1 looped PDS sutures. The wound  was then irrigated and packed with moist Kerlix. The skin was irrigated and dressed with Kerlix. The colon was then  opened with curved Mayo, and Bovie was used to control bleeding.  3-0 Vicryl sutures were used to create the ostomy. This was then dressed  with a 2 piece wafer. The patient was then taken to the PACU in stable condition.

## 2013-10-11 NOTE — Preoperative (Signed)
Beta Blockers   Reason not to administer Beta Blockers:Not Applicable 

## 2013-10-11 NOTE — Anesthesia Postprocedure Evaluation (Signed)
Anesthesia Post Note  Patient: Valerie Jackson  Procedure(s) Performed: Procedure(s) (LRB): EXPLORATORY LAPAROTOMY WITH RECTOSIGMOID COLECTOMY AND END COLOSTOMY (N/A)  Anesthesia type: General  Patient location: PACU  Post pain: Pain level controlled  Post assessment: Post-op Vital signs reviewed  Last Vitals:  Filed Vitals:   10/11/13 1625  BP:   Pulse: 82  Temp: 36.3 C  Resp: 18    Post vital signs: Reviewed  Level of consciousness: sedated  Complications: No apparent anesthesia complications

## 2013-10-12 ENCOUNTER — Encounter (HOSPITAL_COMMUNITY): Payer: Self-pay | Admitting: General Surgery

## 2013-10-12 MED ORDER — METHOCARBAMOL 100 MG/ML IJ SOLN
500.0000 mg | Freq: Four times a day (QID) | INTRAVENOUS | Status: DC | PRN
  Filled 2013-10-12: qty 5

## 2013-10-12 MED ORDER — DIPHENHYDRAMINE HCL 12.5 MG/5ML PO ELIX: 12.5000 mg | ORAL_SOLUTION | Freq: Four times a day (QID) | ORAL | Status: DC | PRN

## 2013-10-12 MED ORDER — DIPHENHYDRAMINE HCL 50 MG/ML IJ SOLN: 12.5000 mg | Freq: Four times a day (QID) | INTRAMUSCULAR | Status: DC | PRN

## 2013-10-12 MED ORDER — ENOXAPARIN SODIUM 40 MG/0.4ML ~~LOC~~ SOLN
40.0000 mg | SUBCUTANEOUS | Status: DC
  Administered 2013-10-12 – 2013-10-17 (×6): 40 mg via SUBCUTANEOUS
  Filled 2013-10-12 (×7): qty 0.4

## 2013-10-12 MED ORDER — ONDANSETRON HCL 4 MG/2ML IJ SOLN: 4.0000 mg | Freq: Four times a day (QID) | INTRAMUSCULAR | Status: DC | PRN

## 2013-10-12 MED ORDER — NALOXONE HCL 0.4 MG/ML IJ SOLN: 0.4000 mg | INTRAMUSCULAR | Status: DC | PRN

## 2013-10-12 MED ORDER — SODIUM CHLORIDE 0.9 % IJ SOLN: 9.0000 mL | INTRAMUSCULAR | Status: DC | PRN

## 2013-10-12 MED ORDER — MORPHINE SULFATE (PF) 1 MG/ML IV SOLN
INTRAVENOUS | Status: DC
  Administered 2013-10-12: 14:00:00 via INTRAVENOUS
  Administered 2013-10-12: 4.79 mg via INTRAVENOUS
  Administered 2013-10-13: 4.5 mg via INTRAVENOUS
  Administered 2013-10-13: 3 mg via INTRAVENOUS
  Administered 2013-10-13: 4.5 mg via INTRAVENOUS
  Administered 2013-10-13: 6 mg via INTRAVENOUS
  Administered 2013-10-14: 3 mL via INTRAVENOUS
  Administered 2013-10-14 (×2): 1.5 mg via INTRAVENOUS
  Administered 2013-10-14: 7.5 mg via INTRAVENOUS
  Administered 2013-10-14: 2 mg via INTRAVENOUS
  Administered 2013-10-14: 6 mg via INTRAVENOUS
  Administered 2013-10-15: 05:00:00 via INTRAVENOUS
  Administered 2013-10-15: 2 mg via INTRAVENOUS
  Administered 2013-10-15: 4.5 mg via INTRAVENOUS
  Filled 2013-10-12 (×3): qty 25

## 2013-10-12 NOTE — Consult Note (Signed)
WOC ostomy consult note Stoma type/location: LLQ Colostomy Stomal assessment/size: 1 and 3/8 inch round, moist, os at center Peristomal assessment: intact, clear Treatment options for stomal/peristomal skin: none indicated Output Serosanguinous, scant Ostomy pouching: 2pc. , 2 and 1/4 inches.  Patient observed me sizing stoma, cutting and preparing pouching system.  Understands impact of round stoma in flat plane with os at center.  Pleased with surgical outcome.  Daughter in room. Established with Secure Start. First day post op, only teaching is to [employer] and Roll closure.  Patient acknowledges how much easier this advancement is than clamp.  I will follow along with you. Thanks, Maudie Flakes, MSN, RN, [place], [place], [place] [phone])

## 2013-10-12 NOTE — Progress Notes (Signed)
1 Day Post-Op  Subjective: Pt is very uncomfortable, no N/V, but doesn't feel up to moving around.  Wonders why she has a pain pump.  Has foley cath.  Pt was very adamant that she didn't want blood thinners pre-operatively.  She says she had a DVT many years ago and doesn't see the statistical need for them.  She tries to negotiate not starting them, but eventually agrees to start lovenox.    Objective: Vital signs in last 24 hours: Temp:  [96.4 F (35.8 C)-98.5 F (36.9 C)] 97.6 F (36.4 C) (01/29 0556) Pulse Rate:  [70-99] 87 (01/29 0556) Resp:  [10-18] 16 (01/29 0556) BP: (105-141)/(66-93) 119/74 mmHg (01/29 0556) SpO2:  [96 %-100 %] 96 % (01/29 0556) Last BM Date: 10/10/13  Intake/Output from previous day: 01/28 0701 - 01/29 0700 In: 4491.3 [I.V.:4491.3] Out: 2400 [Urine:2300; Blood:100] Intake/Output this shift:    PE: Gen:  Alert, NAD, pleasant Abd: Soft, moderate tenderness, mild distension, diminished BS, no HSM, dressing C/D/I, on q pump in place, ostomy pink with only sanguinous drainage, no flatus or BM yet.   Lab Results:   Recent Labs  10/09/13 1015  WBC 8.3  HGB 12.0  HCT 35.5*  PLT 229   BMET  Recent Labs  10/09/13 1015  NA 140  K 4.4  CL 102  CO2 22  GLUCOSE 136*  BUN 15  CREATININE 0.96  CALCIUM 10.0   PT/INR No results found for this basename: LABPROT, INR,  in the last 72 hours CMP     Component Value Date/Time   NA 140 10/09/2013 1015   K 4.4 10/09/2013 1015   CL 102 10/09/2013 1015   CO2 22 10/09/2013 1015   GLUCOSE 136* 10/09/2013 1015   BUN 15 10/09/2013 1015   CREATININE 0.96 10/09/2013 1015   CALCIUM 10.0 10/09/2013 1015   PROT 7.8 10/09/2013 1015   ALBUMIN 3.4* 10/09/2013 1015   AST 23 10/09/2013 1015   ALT 25 10/09/2013 1015   ALKPHOS 97 10/09/2013 1015   BILITOT 0.5 10/09/2013 1015   GFRNONAA 59* 10/09/2013 1015   GFRAA 69* 10/09/2013 1015   Lipase     Component Value Date/Time   LIPASE 24 09/11/2013 2240        Studies/Results: No results found.  Anti-infectives: Anti-infectives   Start     Dose/Rate Route Frequency Ordered Stop   10/10/13 1700  neomycin (MYCIFRADIN) tablet 1,000 mg     1,000 mg Oral 3 times per day on Tue 10/09/13 1629 10/10/13 2354   10/10/13 1700  erythromycin (E-MYCIN) tablet 1,000 mg     1,000 mg Oral 3 times per day on Tue 10/09/13 1629 10/10/13 2353   10/04/13 1400  ertapenem (INVANZ) 1 g in sodium chloride 0.9 % 50 mL IVPB    Comments:  First dose now.   1 g 100 mL/hr over 30 Minutes Intravenous Every 24 hours 10/04/13 1224     09/29/13 2200  fluconazole (DIFLUCAN) tablet 150 mg     150 mg Oral  Once 09/29/13 0708 09/29/13 2118   09/29/13 0200  piperacillin-tazobactam (ZOSYN) IVPB 3.375 g  Status:  Discontinued     3.375 g 12.5 mL/hr over 240 Minutes Intravenous Every 8 hours 09/28/13 1743 10/04/13 1224   09/28/13 2000  fluconazole (DIFLUCAN) tablet 150 mg     150 mg Oral  Once 09/28/13 1842 09/28/13 2127   09/28/13 1800  piperacillin-tazobactam (ZOSYN) IVPB 3.375 g     3.375 g 12.5 mL/hr  over 240 Minutes Intravenous  Once 09/28/13 1650 09/28/13 2249       Assessment/Plan 1.  POD #1 s/p sigmoid colectomy with partial proctectomy with end sigmoid colostomy for Sigmoid diverticulitis with abscess.  -Admitted 09/10/2013 - 09/21/2013 for diverticulitis, but then was readmitted Friday, 09/30/2103 for recurrent disease.  -Went to IR 09/29/2013, but because of improvement of abscess - no drain was placed.  On Zosyn - 1/15 >>>> 1/21  On Invanz - 1/21 >>>>  2. HTN  3. Hypothyroid  4. Depression  5. History of Sickle cell trait  6. Hx of DVT in 20's on BCP (computer wrongly has her as having a PE) - up to this point pre-operatively has refused SQ heparin and lovenox, but has agreed to take it post operatively for dvt prophlyaxis 7. Deconditioning   Plan:  1. NPO, pain control, antiemetics, antibiotics (invanz) 2. Await bowel function prior to advancing 3.  Mobilize to chair and IS 4. SCD's and start Lovenox 5. Plan to remove foley tomorrow 6. PT consult, add robaxin    LOS: 14 days    DORT, [place] 10/12/2013, 8:16 AM Pager: [phone]

## 2013-10-12 NOTE — Progress Notes (Signed)
Pt was referred by nurse during huddle for chaplain visit. Chaplain visited pt and offered encouragement and prayer.    Charyl Dancer,  Chaplain

## 2013-10-12 NOTE — Progress Notes (Signed)
PCA added.    Seen, agree with above.

## 2013-10-12 NOTE — Progress Notes (Signed)
PT Cancellation Note  Patient Details Name: Valerie Jackson MRN: [id number] DOB: Dec 22, 1945   Cancelled Treatment:    Reason Eval/Treat Not Completed: Pain limiting ability to participate (pt declined to participate with PT at this time. will check back another day/time. Thanks. )   Weston Anna, MPT Pager: [phone]

## 2013-10-13 ENCOUNTER — Encounter (INDEPENDENT_AMBULATORY_CARE_PROVIDER_SITE_OTHER): Payer: Medicare Other | Admitting: General Surgery

## 2013-10-13 LAB — BASIC METABOLIC PANEL
BUN: 16 mg/dL (ref 6–23)
CO2: 25 mEq/L (ref 19–32)
Calcium: 9.6 mg/dL (ref 8.4–10.5)
Chloride: 105 mEq/L (ref 96–112)
Creatinine, Ser: 1.02 mg/dL (ref 0.50–1.10)
GFR calc Af Amer: 64 mL/min — ABNORMAL LOW (ref >90)
GFR calc non Af Amer: 55 mL/min — ABNORMAL LOW (ref >90)
Glucose, Bld: 112 mg/dL — ABNORMAL HIGH (ref 70–99)
Potassium: 4.9 mEq/L (ref 3.7–5.3)
Sodium: 143 mEq/L (ref 137–147)

## 2013-10-13 LAB — CBC
HCT: 27.4 % — ABNORMAL LOW (ref 36.0–46.0)
Hemoglobin: 9.3 g/dL — ABNORMAL LOW (ref 12.0–15.0)
MCH: 29.2 pg (ref 26.0–34.0)
MCHC: 33.9 g/dL (ref 30.0–36.0)
MCV: 85.9 fL (ref 78.0–100.0)
Platelets: 205 10*3/uL (ref 150–400)
RBC: 3.19 MIL/uL — ABNORMAL LOW (ref 3.87–5.11)
RDW: 14.2 % (ref 11.5–15.5)
WBC: 17.6 10*3/uL — ABNORMAL HIGH (ref 4.0–10.5)

## 2013-10-13 NOTE — Evaluation (Addendum)
Physical Therapy Evaluation Patient Details Name: MARSELA KUAN MRN: [id number] DOB: 05-11-1946 Today's Date: 10/13/2013 Time: 1027-2536 PT Time Calculation (min): 24 min  PT Assessment / Plan / Recommendation History of Present Illness  68 yo female s/p sigmoid colectomy, partial proctectomy, end colostomy 1/28. Hx of sickle cell, DVT, HTN  Clinical Impression  On eval, pt required Mod assist for 2 to mobilize safely. Increased assistance needed for bed mobility (pt stated she was able to do this herself on yesterday). Mod encouragement for pt to participate. Limited by pain. Will continue to follow and progress activity as able (and as pt will allow). At this time, recommend HHPT, 24/7 supervision. May need RW-will continue to assess gait and balance.     PT Assessment  Patient needs continued PT services    Follow Up Recommendations  Home health PT;Supervision/Assistance - 24 hour (depending on progress)    Does the patient have the potential to tolerate intense rehabilitation      Barriers to Discharge        Equipment Recommendations   (may need walker-to be determined)    Recommendations for Other Services OT consult   Frequency Min 3X/week    Precautions / Restrictions Precautions Precautions: Fall Precaution Comments: abdominal surgery. multiple lines, leads Restrictions Weight Bearing Restrictions: No   Pertinent Vitals/Pain Abdomen 9/10 with activity. PCA encouraged.       Mobility  Bed Mobility Overal bed mobility: Needs Assistance;+ 2 for safety/equipment;+2 for physical assistance Bed Mobility: Rolling;Sidelying to Sit;Sit to Sidelying;Sit to Supine Rolling: Min assist Sidelying to sit: Mod assist Sit to supine: Mod assist General bed mobility comments: Increased time for all tasks. Pt crying out/moaning with all movements including rolling. Multimodal cues for safety, technique, and hand placement. Attempted to instruct pt in logroll technique for bed  mobility, however pt is somewhat hesitant to follow therapist's recommendations and at times prefers to do things her own way.  Transfers Overall transfer level: Needs assistance Transfers: Sit to/from Stand Sit to Stand: +2 safety/equipment;+2 physical assistance;Mod assist;From elevated surface General transfer comment: Increased time. Multimodal cues for safety, technique, hand placement. Assist to rise, stabilize, control descent.  Ambulation/Gait Ambulation/Gait assistance: +2 safety/equipment;+2 physical assistance;Min assist Ambulation Distance (Feet): 85 Feet Assistive device: Rolling walker (2 wheeled) Gait Pattern/deviations: Trunk flexed;Decreased stride length;Decreased step length - right;Decreased step length - left General Gait Details: very slow gait speed. assist to stabilize intermittently. pt very stiff/guardedfatigues easily.     Exercises     PT Diagnosis: Difficulty walking;Acute pain;Generalized weakness  PT Problem List: Decreased range of motion;Decreased activity tolerance;Decreased balance;Decreased mobility;Pain;Decreased knowledge of precautions;Decreased knowledge of use of DME PT Treatment Interventions: DME instruction;Gait training;Functional mobility training;Therapeutic activities;Therapeutic exercise;Patient/family education;Balance training     PT Goals(Current goals can be found in the care plan section) Acute Rehab PT Goals Patient Stated Goal: less pain. to stay in bed.  PT Goal Formulation: With patient Time For Goal Achievement: 10/27/13 Potential to Achieve Goals: Good  Visit Information  Last PT Received On: 10/13/13 Assistance Needed: +2 History of Present Illness: 68 yo female s/p sigmoid colectomy, partial proctectomy, end colostomy 1/28. Hx of sickle cell, DVT, HTN       Prior Functioning  Home Living Family/patient expects to be discharged to:: Private residence Type of Home: House Home Access: Stairs to enter Engineer, site of Steps: 1 Home Layout: Two level Alternate Level Stairs-Number of Steps: 1 flight Home Equipment: None Prior Function Level of Independence: Independent Communication Communication:  No difficulties    Cognition  Cognition Arousal/Alertness: Awake/alert Behavior During Therapy: WFL for tasks assessed/performed Overall Cognitive Status: Within Functional Limits for tasks assessed    Extremity/Trunk Assessment Upper Extremity Assessment Upper Extremity Assessment: Generalized weakness Lower Extremity Assessment Lower Extremity Assessment: Generalized weakness Cervical / Trunk Assessment Cervical / Trunk Assessment: Normal   Balance Balance Overall balance assessment: Needs assistance Sitting-balance support: Feet supported;Bilateral upper extremity supported Sitting balance-Leahy Scale: Fair Standing balance support: During functional activity;Bilateral upper extremity supported Standing balance-Leahy Scale: Poor  End of Session PT - End of Session Activity Tolerance: Patient limited by pain;Patient limited by fatigue Patient left: in bed;with call bell/phone within reach;with family/visitor present (pt refused to sit up in recliner)  GP     Weston Anna, MPT Pager: [phone]

## 2013-10-13 NOTE — Progress Notes (Signed)
Pt refused to have her foley removed. Will pass on and continue to monitor.

## 2013-10-13 NOTE — Progress Notes (Signed)
2 Days Post-Op  Subjective: Pt feels a bit better.  Refusing foley removal until after working with PT.  On PCA which is controlling her pain better.  Has not been OOB yet.    Objective: Vital signs in last 24 hours: Temp:  [98 F (36.7 C)-98.5 F (36.9 C)] 98.5 F (36.9 C) (01/30 0600) Pulse Rate:  [89-97] 89 (01/30 0600) Resp:  [16-20] 17 (01/30 0737) BP: (112-124)/(71-75) 123/75 mmHg (01/30 0600) SpO2:  [91 %-99 %] 94 % (01/30 0600) Last BM Date: 10/11/13  Intake/Output from previous day: 01/29 0701 - 01/30 0700 In: 2683.8 [P.O.:960; I.V.:1673.8; IV Piggyback:50] Out: 3600 [Urine:3600] Intake/Output this shift: Total I/O In: 240 [P.O.:240] Out: 0   PE: Gen:  Alert, NAD, pleasant Abd: Soft, exquisitely tender, minimal distension, very active BS, midline wound clean, beefy red with good granulation tissue formation, drainage is serosanguinous.  On q pump in place.  Ostomy patent, dusky compared to yesterday, some condensation in bag and a little gas.  Lab Results:   Recent Labs  10/13/13 0458  WBC 17.6*  HGB 9.3*  HCT 27.4*  PLT 205   BMET  Recent Labs  10/13/13 0458  NA 143  K 4.9  CL 105  CO2 25  GLUCOSE 112*  BUN 16  CREATININE 1.02  CALCIUM 9.6   PT/INR No results found for this basename: LABPROT, INR,  in the last 72 hours CMP     Component Value Date/Time   NA 143 10/13/2013 0458   K 4.9 10/13/2013 0458   CL 105 10/13/2013 0458   CO2 25 10/13/2013 0458   GLUCOSE 112* 10/13/2013 0458   BUN 16 10/13/2013 0458   CREATININE 1.02 10/13/2013 0458   CALCIUM 9.6 10/13/2013 0458   PROT 7.8 10/09/2013 1015   ALBUMIN 3.4* 10/09/2013 1015   AST 23 10/09/2013 1015   ALT 25 10/09/2013 1015   ALKPHOS 97 10/09/2013 1015   BILITOT 0.5 10/09/2013 1015   GFRNONAA 55* 10/13/2013 0458   GFRAA 64* 10/13/2013 0458   Lipase     Component Value Date/Time   LIPASE 24 09/11/2013 2240       Studies/Results: No results found.  Anti-infectives: Anti-infectives   Start     Dose/Rate Route Frequency Ordered Stop   10/10/13 1700  neomycin (MYCIFRADIN) tablet 1,000 mg     1,000 mg Oral 3 times per day on Tue 10/09/13 1629 10/10/13 2354   10/10/13 1700  erythromycin (E-MYCIN) tablet 1,000 mg     1,000 mg Oral 3 times per day on Tue 10/09/13 1629 10/10/13 2353   10/04/13 1400  ertapenem (INVANZ) 1 g in sodium chloride 0.9 % 50 mL IVPB    Comments:  First dose now.   1 g 100 mL/hr over 30 Minutes Intravenous Every 24 hours 10/04/13 1224     09/29/13 2200  fluconazole (DIFLUCAN) tablet 150 mg     150 mg Oral  Once 09/29/13 0708 09/29/13 2118   09/29/13 0200  piperacillin-tazobactam (ZOSYN) IVPB 3.375 g  Status:  Discontinued     3.375 g 12.5 mL/hr over 240 Minutes Intravenous Every 8 hours 09/28/13 1743 10/04/13 1224   09/28/13 2000  fluconazole (DIFLUCAN) tablet 150 mg     150 mg Oral  Once 09/28/13 1842 09/28/13 2127   09/28/13 1800  piperacillin-tazobactam (ZOSYN) IVPB 3.375 g     3.375 g 12.5 mL/hr over 240 Minutes Intravenous  Once 09/28/13 1650 09/28/13 2249       Assessment/Plan 1. POD #  2 s/p sigmoid colectomy with partial proctectomy with end sigmoid colostomy for Sigmoid diverticulitis with abscess.  -Admitted 09/10/2013 - 09/21/2013 for diverticulitis, but then was readmitted Friday, 09/30/2103 for recurrent disease.  -Went to IR 09/29/2013, but because of improvement of abscess - no drain was placed.  On Zosyn - 1/15 >>>> 1/21  On Invanz - 1/21 >>>>  2. HTN  3. Hypothyroid  4. Depression  5. History of Sickle cell trait  6. Hx of DVT in 20's on BCP (computer wrongly has her as having a PE) - up to this point pre-operatively has refused SQ heparin and lovenox, but has agreed to take lovenox post operatively for dvt prophlyaxis  7. Deconditioning   Plan:  1. Pain control with PCA for now, antiemetics, antibiotics (invanz Day #9)  2. Advance to fulls as tolerated 3. Mobilize OOB and IS  4. SCD's and start Lovenox  5. Plan to remove foley  today after PT 6. PT consult, robaxin for muscle spasms 7. WD dressing changes BID 8. Remove On Q pain pump tomorrow with dressing changes    LOS: 15 days    DORT, Sakiyah Shur 10/13/2013, 9:07 AM Pager: [phone]

## 2013-10-13 NOTE — Progress Notes (Signed)
In to evaluate patient.  Order to change dressing QS.  Patient with abdominal dressing to mid abd CDI.  No note of dsg change.  Instructed patient order to change dressing QS.  Patient states, "the dressing is clean, dry, and intact.  I don't want it changed now.  I will wait for the Dr in the morning."  Abdominal dressing CDI.  Pt refuses dressing change at this time.

## 2013-10-13 NOTE — Progress Notes (Signed)
Pt refuses foley cath removal at this time.  Patient would like to leave foley intact until able to get up wit PT.  Patient unable to get up with PT 10/12/2013 due to pain control issues. Will report to day RN.

## 2013-10-13 NOTE — Progress Notes (Signed)
Pt is still having significant discomfort with moving, but walked today.  Abd soft.   Small amt stool in bag.

## 2013-10-14 LAB — BASIC METABOLIC PANEL
BUN: 12 mg/dL (ref 6–23)
CO2: 27 mEq/L (ref 19–32)
Calcium: 9.6 mg/dL (ref 8.4–10.5)
Chloride: 104 mEq/L (ref 96–112)
Creatinine, Ser: 0.94 mg/dL (ref 0.50–1.10)
GFR calc Af Amer: 71 mL/min — ABNORMAL LOW (ref >90)
GFR calc non Af Amer: 61 mL/min — ABNORMAL LOW (ref >90)
Glucose, Bld: 95 mg/dL (ref 70–99)
Potassium: 4.2 mEq/L (ref 3.7–5.3)
Sodium: 142 mEq/L (ref 137–147)

## 2013-10-14 LAB — CBC
HCT: 28.6 % — ABNORMAL LOW (ref 36.0–46.0)
Hemoglobin: 9.3 g/dL — ABNORMAL LOW (ref 12.0–15.0)
MCH: 28.3 pg (ref 26.0–34.0)
MCHC: 32.5 g/dL (ref 30.0–36.0)
MCV: 86.9 fL (ref 78.0–100.0)
Platelets: 189 10*3/uL (ref 150–400)
RBC: 3.29 MIL/uL — ABNORMAL LOW (ref 3.87–5.11)
RDW: 14.3 % (ref 11.5–15.5)
WBC: 10.2 10*3/uL (ref 4.0–10.5)

## 2013-10-14 LAB — GLUCOSE, CAPILLARY: Glucose-Capillary: 112 mg/dL — ABNORMAL HIGH (ref 70–99)

## 2013-10-14 MED ORDER — FLUCONAZOLE 100 MG PO TABS
100.0000 mg | ORAL_TABLET | Freq: Every day | ORAL | Status: AC
  Administered 2013-10-14 – 2013-10-16 (×3): 100 mg via ORAL
  Filled 2013-10-14 (×3): qty 1

## 2013-10-14 NOTE — Progress Notes (Signed)
This am pt was found sitting in a chair unresponsive. The code bell beside her was pulled. Apparently pt knew she was in trouble and needed help. We helped her to bed, and then she woke up and was responsive and answered our questions. She said she did not realize it was the code bell she was pulling. She said she got up by herself to go to the bathroom to clean up and stated that she started feeling bad and that is why she called for help. VS were stable, code responded. Pt okay after she woke up. Reminded that she is not to try to get up by herself and she is to call for help. Pt verbalized understanding.

## 2013-10-14 NOTE — Progress Notes (Signed)
Order to d/c pt's foley catheter. Pt refused to let us d/c it. Today she stated she would rather we d/c'd catheter tomorrow since she became light headed today when she got out of bed and almost passed out. Per pt request will d/c catheter tomorrow.

## 2013-10-14 NOTE — Progress Notes (Signed)
Patient ID: Valerie Jackson, female   DOB: Aug 20, 1946, 68 y.o.   MRN: [id number] [facility] Surgery Progress Note:   3 Days Post-Op  Subjective: Mental status is clear.  Patient is depressed.  Up in chair Objective: Vital signs in last 24 hours: Temp:  [98.1 F (36.7 C)-98.3 F (36.8 C)] 98.1 F (36.7 C) (01/31 0514) Pulse Rate:  [96] 96 (01/31 0514) Resp:  [16-26] 26 (01/31 0514) BP: (116-148)/(75-88) 148/79 mmHg (01/31 0514) SpO2:  [94 %-99 %] 94 % (01/31 0514)  Intake/Output from previous day: 01/30 0701 - 01/31 0700 In: 2158.8 [P.O.:820; I.V.:1338.8] Out: 3900 [Urine:3900] Intake/Output this shift:    Physical Exam: Work of breathing is not labored.  O2 in place and on PCA.  Incision covered.  Ostomy with little output.    Lab Results:  Results for orders placed during the hospital encounter of 09/28/13 (from the past 48 hour(s))  CBC     Status: Abnormal   Collection Time    10/13/13  4:58 AM      Result Value Range   WBC 17.6 (*) 4.0 - 10.5 K/uL   RBC 3.19 (*) 3.87 - 5.11 MIL/uL   Hemoglobin 9.3 (*) 12.0 - 15.0 g/dL   HCT 27.4 (*) 36.0 - 46.0 %   MCV 85.9  78.0 - 100.0 fL   MCH 29.2  26.0 - 34.0 pg   MCHC 33.9  30.0 - 36.0 g/dL   RDW 14.2  11.5 - 15.5 %   Platelets 205  150 - 400 K/uL  BASIC METABOLIC PANEL     Status: Abnormal   Collection Time    10/13/13  4:58 AM      Result Value Range   Sodium 143  137 - 147 mEq/L   Potassium 4.9  3.7 - 5.3 mEq/L   Chloride 105  96 - 112 mEq/L   CO2 25  19 - 32 mEq/L   Glucose, Bld 112 (*) 70 - 99 mg/dL   BUN 16  6 - 23 mg/dL   Creatinine, Ser 1.02  0.50 - 1.10 mg/dL   Calcium 9.6  8.4 - 10.5 mg/dL   GFR calc non Af Amer 55 (*) >90 mL/min   GFR calc Af Amer 64 (*) >90 mL/min   Comment: (NOTE)     The eGFR has been calculated using the CKD EPI equation.     This calculation has not been validated in all clinical situations.     eGFR's persistently <90 mL/min signify possible Chronic Kidney     Disease.  CBC      Status: Abnormal   Collection Time    10/14/13  4:37 AM      Result Value Range   WBC 10.2  4.0 - 10.5 K/uL   RBC 3.29 (*) 3.87 - 5.11 MIL/uL   Hemoglobin 9.3 (*) 12.0 - 15.0 g/dL   HCT 28.6 (*) 36.0 - 46.0 %   MCV 86.9  78.0 - 100.0 fL   MCH 28.3  26.0 - 34.0 pg   MCHC 32.5  30.0 - 36.0 g/dL   RDW 14.3  11.5 - 15.5 %   Platelets 189  150 - 400 K/uL  BASIC METABOLIC PANEL     Status: Abnormal   Collection Time    10/14/13  4:37 AM      Result Value Range   Sodium 142  137 - 147 mEq/L   Potassium 4.2  3.7 - 5.3 mEq/L   Chloride 104  96 -  112 mEq/L   CO2 27  19 - 32 mEq/L   Glucose, Bld 95  70 - 99 mg/dL   BUN 12  6 - 23 mg/dL   Creatinine, Ser 0.94  0.50 - 1.10 mg/dL   Calcium 9.6  8.4 - 10.5 mg/dL   GFR calc non Af Amer 61 (*) >90 mL/min   GFR calc Af Amer 71 (*) >90 mL/min   Comment: (NOTE)     The eGFR has been calculated using the CKD EPI equation.     This calculation has not been validated in all clinical situations.     eGFR's persistently <90 mL/min signify possible Chronic Kidney     Disease.    Radiology/Results: No results found.  Anti-infectives: Anti-infectives   Start     Dose/Rate Route Frequency Ordered Stop   10/10/13 1700  neomycin (MYCIFRADIN) tablet 1,000 mg     1,000 mg Oral 3 times per day on Tue 10/09/13 1629 10/10/13 2354   10/10/13 1700  erythromycin (E-MYCIN) tablet 1,000 mg     1,000 mg Oral 3 times per day on Tue 10/09/13 1629 10/10/13 2353   10/04/13 1400  ertapenem (INVANZ) 1 g in sodium chloride 0.9 % 50 mL IVPB    Comments:  First dose now.   1 g 100 mL/hr over 30 Minutes Intravenous Every 24 hours 10/04/13 1224     09/29/13 2200  fluconazole (DIFLUCAN) tablet 150 mg     150 mg Oral  Once 09/29/13 0708 09/29/13 2118   09/29/13 0200  piperacillin-tazobactam (ZOSYN) IVPB 3.375 g  Status:  Discontinued     3.375 g 12.5 mL/hr over 240 Minutes Intravenous Every 8 hours 09/28/13 1743 10/04/13 1224   09/28/13 2000  fluconazole (DIFLUCAN)  tablet 150 mg     150 mg Oral  Once 09/28/13 1842 09/28/13 2127   09/28/13 1800  piperacillin-tazobactam (ZOSYN) IVPB 3.375 g     3.375 g 12.5 mL/hr over 240 Minutes Intravenous  Once 09/28/13 1650 09/28/13 2249      Assessment/Plan: Problem List: Patient Active Problem List   Diagnosis Date Noted  . Diverticulitis of colon with perforation 09/28/2013  . Pericolonic abscess due to diverticulitis 09/12/2013  . Diverticulitis large intestine 09/12/2013  . Nonspecific elevation of levels of transaminase or lactic acid dehydrogenase (LDH) 07/19/2013  . Sickle cell trait 07/19/2013  . Obesity (BMI 30-39.9) 04/12/2013  . Hypokalemia 02/28/2013  . Other and unspecified hyperlipidemia 02/28/2013  . Hypertension 02/28/2013  . Unspecified hypothyroidism 02/28/2013    Having incisional pain.  Will add abdominal binder to facilitate walking.   3 Days Post-Op    LOS: 16 days   Matt B. Hassell Done, MD, [facility] [phone] beeper [phone]  10/14/2013 8:29 AM

## 2013-10-15 MED ORDER — AMOXICILLIN-POT CLAVULANATE 875-125 MG PO TABS: 1.0000 | ORAL_TABLET | Freq: Two times a day (BID) | ORAL | Status: DC

## 2013-10-15 MED ORDER — AMOXICILLIN-POT CLAVULANATE 875-125 MG PO TABS
1.0000 | ORAL_TABLET | Freq: Two times a day (BID) | ORAL | Status: DC
  Administered 2013-10-15 – 2013-10-17 (×4): 1 via ORAL
  Filled 2013-10-15 (×5): qty 1

## 2013-10-15 MED ORDER — SODIUM CHLORIDE 0.9 % IV SOLN
1.0000 g | INTRAVENOUS | Status: DC
  Filled 2013-10-15: qty 1

## 2013-10-15 NOTE — Progress Notes (Signed)
Patient ID: Valerie Jackson, female   DOB: Oct 10, 1945, 68 y.o.   MRN: [id number] [facility] Surgery Progress Note:   4 Days Post-Op  Subjective: Mental status is clearer.  Core strength is not enough to get her to pull up by herself. Objective: Vital signs in last 24 hours: Temp:  [97.6 F (36.4 C)-98.6 F (37 C)] 98.3 F (36.8 C) (02/01 0543) Pulse Rate:  [74-90] 80 (02/01 0543) Resp:  [16-21] 16 (02/01 0543) BP: (124-156)/(74-96) 156/96 mmHg (02/01 0543) SpO2:  [96 %-100 %] 98 % (02/01 0543) FiO2 (%):  [42 %] 42 % (01/31 2302)  Intake/Output from previous day: 01/31 0701 - 02/01 0700 In: 1610 [P.O.:480; I.V.:1130] Out: 3775 [Urine:3600; Stool:175] Intake/Output this shift:    Physical Exam: Work of breathing is not labored.  Dressing in place.  Q pump in place.  Lab Results:  Results for orders placed during the hospital encounter of 09/28/13 (from the past 48 hour(s))  CBC     Status: Abnormal   Collection Time    10/14/13  4:37 AM      Result Value Range   WBC 10.2  4.0 - 10.5 K/uL   RBC 3.29 (*) 3.87 - 5.11 MIL/uL   Hemoglobin 9.3 (*) 12.0 - 15.0 g/dL   HCT 28.6 (*) 36.0 - 46.0 %   MCV 86.9  78.0 - 100.0 fL   MCH 28.3  26.0 - 34.0 pg   MCHC 32.5  30.0 - 36.0 g/dL   RDW 14.3  11.5 - 15.5 %   Platelets 189  150 - 400 K/uL  BASIC METABOLIC PANEL     Status: Abnormal   Collection Time    10/14/13  4:37 AM      Result Value Range   Sodium 142  137 - 147 mEq/L   Potassium 4.2  3.7 - 5.3 mEq/L   Chloride 104  96 - 112 mEq/L   CO2 27  19 - 32 mEq/L   Glucose, Bld 95  70 - 99 mg/dL   BUN 12  6 - 23 mg/dL   Creatinine, Ser 0.94  0.50 - 1.10 mg/dL   Calcium 9.6  8.4 - 10.5 mg/dL   GFR calc non Af Amer 61 (*) >90 mL/min   GFR calc Af Amer 71 (*) >90 mL/min   Comment: (NOTE)     The eGFR has been calculated using the CKD EPI equation.     This calculation has not been validated in all clinical situations.     eGFR's persistently <90 mL/min signify possible Chronic  Kidney     Disease.  GLUCOSE, CAPILLARY     Status: Abnormal   Collection Time    10/14/13 11:39 AM      Result Value Range   Glucose-Capillary 112 (*) 70 - 99 mg/dL    Radiology/Results: No results found.  Anti-infectives: Anti-infectives   Start     Dose/Rate Route Frequency Ordered Stop   10/14/13 1845  fluconazole (DIFLUCAN) tablet 100 mg     100 mg Oral Daily 10/14/13 1832 10/17/13 0959   10/10/13 1700  neomycin (MYCIFRADIN) tablet 1,000 mg     1,000 mg Oral 3 times per day on Tue 10/09/13 1629 10/10/13 2354   10/10/13 1700  erythromycin (E-MYCIN) tablet 1,000 mg     1,000 mg Oral 3 times per day on Tue 10/09/13 1629 10/10/13 2353   10/04/13 1400  ertapenem (INVANZ) 1 g in sodium chloride 0.9 % 50 mL IVPB  Comments:  First dose now.   1 g 100 mL/hr over 30 Minutes Intravenous Every 24 hours 10/04/13 1224     09/29/13 2200  fluconazole (DIFLUCAN) tablet 150 mg     150 mg Oral  Once 09/29/13 0708 09/29/13 2118   09/29/13 0200  piperacillin-tazobactam (ZOSYN) IVPB 3.375 g  Status:  Discontinued     3.375 g 12.5 mL/hr over 240 Minutes Intravenous Every 8 hours 09/28/13 1743 10/04/13 1224   09/28/13 2000  fluconazole (DIFLUCAN) tablet 150 mg     150 mg Oral  Once 09/28/13 1842 09/28/13 2127   09/28/13 1800  piperacillin-tazobactam (ZOSYN) IVPB 3.375 g     3.375 g 12.5 mL/hr over 240 Minutes Intravenous  Once 09/28/13 1650 09/28/13 2249      Assessment/Plan: Problem List: Patient Active Problem List   Diagnosis Date Noted  . Diverticulitis of colon with perforation 09/28/2013  . Pericolonic abscess due to diverticulitis 09/12/2013  . Diverticulitis large intestine 09/12/2013  . Nonspecific elevation of levels of transaminase or lactic acid dehydrogenase (LDH) 07/19/2013  . Sickle cell trait 07/19/2013  . Obesity (BMI 30-39.9) 04/12/2013  . Hypokalemia 02/28/2013  . Other and unspecified hyperlipidemia 02/28/2013  . Hypertension 02/28/2013  . Unspecified  hypothyroidism 02/28/2013    Slow progress.  Will try abdominal binder 4 Days Post-Op    LOS: 17 days   Matt B. Hassell Done, MD, [facility] [phone] beeper [phone]  10/15/2013 7:56 AM

## 2013-10-15 NOTE — Progress Notes (Signed)
Patient was given information regarding discontinuing the use of foley catheter in AM. Patient has been educated about the importance of discontinuing the foley to assist in progression in her care in addition to decreasing risk of infection etc. Patient request to speak to rounding MD prior to having foley catheter removed.

## 2013-10-15 NOTE — Progress Notes (Addendum)
Patient has orders to have abdominal binder in place. Discuss the reason for this device. Patient prefers not to wear at this time. Patient has the on-Q pump and colostomy in place and feels that it is to much at this time. Nursing to reevaluate for the usage of the binder on next shift.

## 2013-10-15 NOTE — Progress Notes (Signed)
Attempted PICC x 4 attempts.  Unable to obtain.  Veins constrict and unable to thread guide wire.  Attempted brachial vein Pt c/o pain similar to nerve pain therefore stopped and needle withdrawn.  If PICC and IV accessed desired recommend referral to IR.

## 2013-10-15 NOTE — Progress Notes (Signed)
10-15-13   1240     IV Team Note;   Called back again today for iv restart; pt requiring frequent restarts;  Had a picc line on last admission;   Pt with very poor peripheral status;  Current iv site near left wrist area "hurts" per pt;  Pt states "it hurts like a nerve-pain when I move my wrist;"  2 IV RNs attempted new restart this am without success;   Suggest current piv be removed due to pain the pt is describing;  RN called to the room and will remove the iv;  Only other suggestion is another picc line which the pt is extremely anxious to have placed asap.    Raynelle Fanning RN IV Team

## 2013-10-15 NOTE — Progress Notes (Signed)
Pt states that she could move around easier if she didn't have her Morphine PCA. She is not self administering very much.

## 2013-10-16 LAB — CBC
HCT: 30.8 % — ABNORMAL LOW (ref 36.0–46.0)
Hemoglobin: 10.4 g/dL — ABNORMAL LOW (ref 12.0–15.0)
MCH: 28.8 pg (ref 26.0–34.0)
MCHC: 33.8 g/dL (ref 30.0–36.0)
MCV: 85.3 fL (ref 78.0–100.0)
Platelets: 236 10*3/uL (ref 150–400)
RBC: 3.61 MIL/uL — ABNORMAL LOW (ref 3.87–5.11)
RDW: 13.8 % (ref 11.5–15.5)
WBC: 9 10*3/uL (ref 4.0–10.5)

## 2013-10-16 LAB — BASIC METABOLIC PANEL
BUN: 10 mg/dL (ref 6–23)
CO2: 25 mEq/L (ref 19–32)
Calcium: 9.9 mg/dL (ref 8.4–10.5)
Chloride: 102 mEq/L (ref 96–112)
Creatinine, Ser: 1 mg/dL (ref 0.50–1.10)
GFR calc Af Amer: 66 mL/min — ABNORMAL LOW (ref >90)
GFR calc non Af Amer: 57 mL/min — ABNORMAL LOW (ref >90)
Glucose, Bld: 112 mg/dL — ABNORMAL HIGH (ref 70–99)
Potassium: 3.8 mEq/L (ref 3.7–5.3)
Sodium: 141 mEq/L (ref 137–147)

## 2013-10-16 MED ORDER — POLYETHYLENE GLYCOL 3350 17 G PO PACK
17.0000 g | PACK | Freq: Every day | ORAL | Status: DC
  Administered 2013-10-17: 17 g via ORAL
  Filled 2013-10-16: qty 1

## 2013-10-16 MED ORDER — METOPROLOL TARTRATE 1 MG/ML IV SOLN
5.0000 mg | Freq: Four times a day (QID) | INTRAVENOUS | Status: DC | PRN
  Filled 2013-10-16: qty 5

## 2013-10-16 MED ORDER — POLYETHYLENE GLYCOL 3350 17 G PO PACK
17.0000 g | PACK | Freq: Two times a day (BID) | ORAL | Status: DC | PRN
  Filled 2013-10-16: qty 1

## 2013-10-16 MED ORDER — MORPHINE SULFATE 2 MG/ML IJ SOLN: 2.0000 mg | INTRAMUSCULAR | Status: DC | PRN

## 2013-10-16 MED ORDER — ALUM & MAG HYDROXIDE-SIMETH 200-200-20 MG/5ML PO SUSP: 30.0000 mL | Freq: Four times a day (QID) | ORAL | Status: DC | PRN

## 2013-10-16 MED ORDER — METHOCARBAMOL 500 MG PO TABS
1000.0000 mg | ORAL_TABLET | Freq: Three times a day (TID) | ORAL | Status: DC
  Administered 2013-10-16: 500 mg via ORAL
  Filled 2013-10-16: qty 2

## 2013-10-16 MED ORDER — SODIUM CHLORIDE 0.9 % IJ SOLN: 3.0000 mL | Freq: Two times a day (BID) | INTRAMUSCULAR | Status: DC

## 2013-10-16 MED ORDER — LACTATED RINGERS IV BOLUS (SEPSIS): 1000.0000 mL | Freq: Three times a day (TID) | INTRAVENOUS | Status: DC | PRN

## 2013-10-16 MED ORDER — DIPHENHYDRAMINE HCL 50 MG/ML IJ SOLN: 12.5000 mg | Freq: Four times a day (QID) | INTRAMUSCULAR | Status: DC | PRN

## 2013-10-16 MED ORDER — ACETAMINOPHEN 500 MG PO TABS
1000.0000 mg | ORAL_TABLET | Freq: Three times a day (TID) | ORAL | Status: DC
  Filled 2013-10-16 (×3): qty 2

## 2013-10-16 MED ORDER — SODIUM CHLORIDE 0.9 % IJ SOLN: 3.0000 mL | INTRAMUSCULAR | Status: DC | PRN

## 2013-10-16 MED ORDER — METHOCARBAMOL 500 MG PO TABS
500.0000 mg | ORAL_TABLET | Freq: Three times a day (TID) | ORAL | Status: DC
  Administered 2013-10-16: 500 mg via ORAL
  Filled 2013-10-16 (×3): qty 1

## 2013-10-16 MED ORDER — ONDANSETRON HCL 4 MG PO TABS: 4.0000 mg | ORAL_TABLET | Freq: Four times a day (QID) | ORAL | Status: DC | PRN

## 2013-10-16 MED ORDER — ENSURE COMPLETE PO LIQD: 237.0000 mL | Freq: Two times a day (BID) | ORAL | Status: DC

## 2013-10-16 MED ORDER — LIP MEDEX EX OINT: 1.0000 "application " | TOPICAL_OINTMENT | Freq: Two times a day (BID) | CUTANEOUS | Status: DC

## 2013-10-16 MED ORDER — METOPROLOL TARTRATE 12.5 MG HALF TABLET
12.5000 mg | ORAL_TABLET | Freq: Two times a day (BID) | ORAL | Status: DC | PRN
  Filled 2013-10-16: qty 1

## 2013-10-16 MED ORDER — TRAMADOL HCL 50 MG PO TABS
50.0000 mg | ORAL_TABLET | Freq: Four times a day (QID) | ORAL | Status: DC | PRN
  Administered 2013-10-17 (×2): 50 mg via ORAL
  Filled 2013-10-16 (×2): qty 1

## 2013-10-16 MED ORDER — MAGIC MOUTHWASH
15.0000 mL | Freq: Four times a day (QID) | ORAL | Status: DC | PRN
  Filled 2013-10-16: qty 15

## 2013-10-16 NOTE — Consult Note (Signed)
WOC ostomy consult note Stoma type/location:  LLQ Had sigmoid colectomy in conjunction with partial proctectomy with end sigmoid colostomy.  Stomal assessment/size:  Not assessed this visit.  Pouch was changed this AM, per patient.  Peristomal assessment: Not assessed  Output dark brown, liquid stool Ostomy pouching: /2pc.  Education provided: materials in patient room regarding Colostomy. Patient indicates she has reviewed materials.  Worked in [facility] for many years and feels that she will manage colostomy well.  Discussed prinicples of ostomy care, including cutting barrier to fit and proximity of surgical incision creating a pouching challenge.  Agrees to take pouch system down tomorrow with [place] nurse to practice pouch application.  Education provided:            Geophysicist/field seismologist and Physiology of GI tract   Anatomy and Physiology of GU tract   Stoma Characteristics   Nutritional considerations for a patient with an ostomy   Barrier/pouch change procedure   Measuring stoma   Peristomal care   Pouch empty Learning considerations and barriers:   None   :    Printed materials given:   Colostomy care Outcomes/Evaluations:   Communicated understanding   Follow-up/reinforcement needed WOC nursing will continue to follow.  Domenic Moras RN BSN [place] Pager [phone]

## 2013-10-16 NOTE — Progress Notes (Signed)
Physical Therapy Treatment Patient Details Name: Valerie Jackson MRN: [id number] DOB: October 11, 1945 Today's Date: 10/16/2013 Time: 9458-5929 PT Time Calculation (min): 16 min  PT Assessment / Plan / Recommendation  History of Present Illness 67 yo female s/p sigmoid colectomy, partial proctectomy, end colostomy 1/28. Hx of sickle cell, DVT, HTN   PT Comments   Pt has more difficulty getting out of bed and needs further practice. Pt want to practice steps, bedroom on second level.  Recommend HHPT and maybe a RW.  Follow Up Recommendations  Home health PT;Supervision/Assistance - 24 hour     Does the patient have the potential to tolerate intense rehabilitation     Barriers to Discharge        Equipment Recommendations  Rolling walker with 5" wheels (may benefit)    Recommendations for Other Services    Frequency Min 3X/week   Progress towards PT Goals Progress towards PT goals: Progressing toward goals  Plan Current plan remains appropriate    Precautions / Restrictions Precautions Precautions: Fall Precaution Comments: abdominal surgery. colostomy   Pertinent Vitals/Pain abd pain4    Mobility  Bed Mobility Bed Mobility: Sidelying to Sit;Sit to Sidelying Rolling: Min guard Sidelying to sit: Mod assist Sit to supine: Min assist General bed mobility comments: increased time, Pt has more difficulty getting out of bed, encouraged pt to roll to side and opush up. Transfers Overall transfer level: Needs assistance Equipment used: Rolling walker (2 wheeled) Transfers: Sit to/from Stand Sit to Stand: Supervision;From elevated surface General transfer comment: Increased time. Multimodal cues for safety, technique, hand placement. Assist to rise, stabilize, control descent.  Ambulation/Gait Ambulation/Gait assistance: Min assist Ambulation Distance (Feet): 150 Feet Assistive device: Rolling walker (2 wheeled) Gait Pattern/deviations: Step-through pattern;Decreased stride  length;Trunk flexed General Gait Details: slow  speed, cues for posture.    Exercises     PT Diagnosis:    PT Problem List:   PT Treatment Interventions:     PT Goals (current goals can now be found in the care plan section)    Visit Information  Last PT Received On: 10/16/13 Assistance Needed: +1 History of Present Illness: 68 yo female s/p sigmoid colectomy, partial proctectomy, end colostomy 1/28. Hx of sickle cell, DVT, HTN    Subjective Data      Cognition  Cognition Arousal/Alertness: Awake/alert Behavior During Therapy: WFL for tasks assessed/performed Overall Cognitive Status: Within Functional Limits for tasks assessed    Balance     End of Session PT - End of Session Equipment Utilized During Treatment: Gait belt Activity Tolerance: Patient tolerated treatment well Patient left: in bed;with call bell/phone within reach Nurse Communication: Mobility status   GP     Claretha Cooper 10/16/2013, 6:02 PM

## 2013-10-16 NOTE — Progress Notes (Signed)
Nurse took pt to bathroom this a.m. Per pt request.  Pt voided and stated that she was feeling dizzy and requested a cool wash cloth.  Nurse then watched as pt intentionally sat down on the floor from sitting on the toilet.  Pt then proceeded to lay down on the floor.  Pt stated to two nurses that she felt hot and clammy and knows that lying on the cool bathroom floor would make her feel better.  Two nurses then assisted pt up off the floor and back to the bed.  Coralie Keens, PA was notified.

## 2013-10-16 NOTE — Progress Notes (Signed)
5 Days Post-Op  Subjective: Pt notes improvements in her condition.  Less pain, lost IV access, but tolerating fulls.  Tolerating PO meds.  Urinating well.  Improving with mobilization with PT.  They are recommending [place] PT with 24 hour supervision.  Dressing changes painful.  C/o feeling dizzy.  Depressed.  Pt continues to refuse many things that may be helpful in her recovery and care including removal of foley catheter, abdominal binder for comfort, robaxin muscle relaxer's.    Objective: Vital signs in last 24 hours: Temp:  [98 F (36.7 C)-98.8 F (37.1 C)] 98.8 F (37.1 C) (02/02 0451) Pulse Rate:  [83-101] 101 (02/02 0451) Resp:  [16-20] 18 (02/02 0451) BP: (112-147)/(75-90) 119/83 mmHg (02/02 0451) SpO2:  [92 %-99 %] 93 % (02/02 0451) FiO2 (%):  [40 %] 40 % (02/01 1010) Last BM Date: 10/15/13 (colostomy- frequent)  Intake/Output from previous day: 02/01 0701 - 02/02 0700 In: [place] [P.O.:720; I.V.:1120] Out: 2085 [Urine:1750; Stool:335] Intake/Output this shift:    PE: Gen:  Alert, NAD, anxious/depressed mood Abd: Soft, moderate tenderness with dressing change, ND, great BS, no HSM, midline wound C/D, ostomy pouch leaking a bit, greenish brown output from colostomy bag, good flatus in bag.   Lab Results:   Recent Labs  10/14/13 0437  WBC 10.2  HGB 9.3*  HCT 28.6*  PLT 189   BMET  Recent Labs  10/14/13 0437  NA 142  K 4.2  CL 104  CO2 27  GLUCOSE 95  BUN 12  CREATININE 0.94  CALCIUM 9.6   PT/INR No results found for this basename: LABPROT, INR,  in the last 72 hours CMP     Component Value Date/Time   NA 142 10/14/2013 0437   K 4.2 10/14/2013 0437   CL 104 10/14/2013 0437   CO2 27 10/14/2013 0437   GLUCOSE 95 10/14/2013 0437   BUN 12 10/14/2013 0437   CREATININE 0.94 10/14/2013 0437   CALCIUM 9.6 10/14/2013 0437   PROT 7.8 10/09/2013 1015   ALBUMIN 3.4* 10/09/2013 1015   AST 23 10/09/2013 1015   ALT 25 10/09/2013 1015   ALKPHOS 97 10/09/2013 1015   BILITOT  0.5 10/09/2013 1015   GFRNONAA 61* 10/14/2013 0437   GFRAA 71* 10/14/2013 0437   Lipase     Component Value Date/Time   LIPASE 24 09/11/2013 2240       Studies/Results: No results found.  Anti-infectives: Anti-infectives   Start     Dose/Rate Route Frequency Ordered Stop   10/16/13 1600  ertapenem (INVANZ) 1 g in sodium chloride 0.9 % 50 mL IVPB     1 g 100 mL/hr over 30 Minutes Intravenous Every 24 hours 10/15/13 1628     10/15/13 2200  amoxicillin-clavulanate (AUGMENTIN) 875-125 MG per tablet 1 tablet  Status:  Discontinued     1 tablet Oral Every 12 hours 10/15/13 1558 10/15/13 1558   10/15/13 1800  amoxicillin-clavulanate (AUGMENTIN) 875-125 MG per tablet 1 tablet     1 tablet Oral Every 12 hours 10/15/13 1556     10/14/13 1845  fluconazole (DIFLUCAN) tablet 100 mg     100 mg Oral Daily 10/14/13 1832 10/17/13 0959   10/10/13 1700  neomycin (MYCIFRADIN) tablet 1,000 mg     1,000 mg Oral 3 times per day on Tue 10/09/13 1629 10/10/13 2354   10/10/13 1700  erythromycin (E-MYCIN) tablet 1,000 mg     1,000 mg Oral 3 times per day on Tue 10/09/13 1629 10/10/13 2353  10/04/13 1400  ertapenem (INVANZ) 1 g in sodium chloride 0.9 % 50 mL IVPB  Status:  Discontinued    Comments:  First dose now.   1 g 100 mL/hr over 30 Minutes Intravenous Every 24 hours 10/04/13 1224 10/15/13 1623   09/29/13 2200  fluconazole (DIFLUCAN) tablet 150 mg     150 mg Oral  Once 09/29/13 0708 09/29/13 2118   09/29/13 0200  piperacillin-tazobactam (ZOSYN) IVPB 3.375 g  Status:  Discontinued     3.375 g 12.5 mL/hr over 240 Minutes Intravenous Every 8 hours 09/28/13 1743 10/04/13 1224   09/28/13 2000  fluconazole (DIFLUCAN) tablet 150 mg     150 mg Oral  Once 09/28/13 1842 09/28/13 2127   09/28/13 1800  piperacillin-tazobactam (ZOSYN) IVPB 3.375 g     3.375 g 12.5 mL/hr over 240 Minutes Intravenous  Once 09/28/13 1650 09/28/13 2249       Assessment/Plan 1. POD #5 s/p sigmoid colectomy with partial  proctectomy with end sigmoid colostomy for Sigmoid diverticulitis with abscess.  -Admitted 09/10/2013 - 09/21/2013 for diverticulitis, but then was readmitted Friday, 09/30/2103 for recurrent disease.  -Went to IR 09/29/2013, but because of improvement of abscess - no drain was placed.  On Zosyn - 1/15 >>>> 1/21  On Invanz - 1/21 >>>> 10/15/13 Augmentin 10/15/13 >>>>   2. HTN  3. Hypothyroid  4. Depression  5. History of Sickle cell trait  6. Hx of DVT in 20's on BCP (computer wrongly has her as having a PE) - up to this point pre-operatively has refused SQ heparin and lovenox, but has agreed to take lovenox post operatively for dvt prophlyaxis  7. Deconditioning   Plan:  1. PRN pain meds, robaxin 2. Advance to soft diet 3. Mobilize OOB and IS  4. SCD's and start Lovenox  5. Foley removed yesterday secondary to patient refusing removal on POD #1 or 2 6. PT recommending HH PT and 24 hour care as of now 7. WD dressing changes BID  8. Remove On Q pain pump  9. D/c home when pain better controlled, hopefully tomorrow     LOS: 18 days    Jackson, Valerie Clapper 10/16/2013, 7:45 AM Pager: [phone]

## 2013-10-16 NOTE — Progress Notes (Signed)
NUTRITION FOLLOW UP  Intervention:   - Continue Resource Breeze BID - Educated pt on diet therapy for new colostomy and discussed foods that can block on stoma as well as foods that produce odor and can thicken/thin stool output. Handouts provided with RD contact information. Also educated pt on high calorie/protein foods to maximize PO intake as pt with ongoing poor appetite, handouts also provided of this information.   - Will continue to monitor   Nutrition Dx:   Inadequate oral intake related to diverticular disease as evidenced by wt loss - improving    Goal:   Pt to meet >/= 90% of their estimated nutrition needs - not met    Monitor:   Weights, labs, intake  Assessment:   68 year old female recently hospitalized with acute diverticulitis with abscess formation. Patient was treated with bowel rest and intravenous antibiotics. She was discharged home 1 week ago on oral Augmentin.  Patient has continued to have lower abdominal pain. She is eating a very limited diet. She is having loose bowel movements. Patient underwent a CT scan of the abdomen and pelvis on 09/27/2013. This shows persistent acute diverticulitis involving the sigmoid colon.   1/19 - Pt reports that she has lost 25 lbs within the past year. She says that she has lost her taste for many foods. She does not like Ensure Complete, but agreed to try Ensure Pudding. She also had questions about her diet once discharged from the hospital which were answered by RD. She is recommended to follow a low residue diet by her physician.  1/27 - Reviewed events since last RD visit. Diet advanced to low fiber 1/21. Plan is for surgery tomorrow for open sigmoid colectomy and possible ostomy. Met with pt who c/o small portion sizes of foods on low fiber diet. States she does not like the Ensure pudding but has been drinking the [employer]. States she has been eating 50% of meals. C/o abdominal pain on palpitation as well as lots of  gas.   1/28 - Had sigmoid colectomy in conjunction with partial proctectomy with end sigmoid colostomy.   1/31 - Episode of unresponsiveness, then pt woke up.   2/2 - Diet advanced to dysphagia 3, thin. Met with pt who reports still nothing tastes good. Ate 50% of tuna sandwich today on white bread. Has been drinking 2 Resource Breeze per day (each contain 250 calories, 9g protein). Had 361m total colostomy output yesterday.   Height: Ht Readings from Last 1 Encounters:  09/28/13 5' 6"  (1.676 m)    Weight Status:   Wt Readings from Last 1 Encounters:  09/28/13 182 lb 6.2 oz (82.73 kg)    Re-estimated needs:  Kcal: 1750-1950 Protein: 70-90g Fluid: 1.7-1.9L/day  Skin: Intact  Diet Order: Dysphagia 3, thin   Intake/Output Summary (Last 24 hours) at 10/16/13 1428 Last data filed at 10/16/13 1415  Gross per 24 hour  Intake    600 ml  Output   1965 ml  Net  -1365 ml    Last BM: 2/2 from colostomy    Labs:   Recent Labs Lab 10/13/13 0458 10/14/13 0437 10/16/13 0910  NA 143 142 141  K 4.9 4.2 3.8  CL 105 104 102  CO2 25 27 25   BUN 16 12 10   CREATININE 1.02 0.94 1.00  CALCIUM 9.6 9.6 9.9  GLUCOSE 112* 95 112*    CBG (last 3)   Recent Labs  10/14/13 1139  GLUCAP 112*    Scheduled Meds: .  amoxicillin-clavulanate  1 tablet Oral Q12H  . enoxaparin (LOVENOX) injection  40 mg Subcutaneous Q24H  . feeding supplement (ENSURE COMPLETE)  237 mL Oral BID BM  . feeding supplement (RESOURCE BREEZE)  1 Container Oral BID BM  . levothyroxine  125 mcg Oral QAC breakfast  . methocarbamol  1,000 mg Oral TID  . polyethylene glycol  17 g Oral BID    Continuous Infusions: . 0.9 % NaCl with KCl 40 mEq / L 75 mL/hr at 10/14/13 0600     [place], [place], [place] Pager [phone] After Hours Pager

## 2013-10-16 NOTE — Progress Notes (Signed)
Has complaints of soreness in medications, but does can fascia feeling better.  Appetite slowly coming back.  Still tends to dictate care on what medicines, what dosing, etc.  We will try and let her recover and some of that to avoid conflict.  Lower her Robaxin dose.  Lower her MiraLax dose.  Hold off on a wound VAC since she refuses.  Hold off on replacing a line since she is close to discharge.  If she is not ready for discharge tomorrow and oral pain control is not adequate, replace IV access with a PICC line.  We will try and do intramuscular shots as needed for pain control if PO not enough.  D/C patient from hospital when patient meets criteria (anticipate in 1-2 day(s)):  Tolerating oral intake well Ambulating in walkways Adequate pain control without IV medications Urinating  Having flatus  [name], M.D., F.A.C.S. Gastrointestinal and Minimally Invasive Surgery [facility] [address] [place], [place] [address] [phone] Main / Paging

## 2013-10-17 ENCOUNTER — Ambulatory Visit: Payer: Medicare Other | Admitting: Internal Medicine

## 2013-10-17 LAB — CBC
HCT: 31.1 % — ABNORMAL LOW (ref 36.0–46.0)
Hemoglobin: 10.5 g/dL — ABNORMAL LOW (ref 12.0–15.0)
MCH: 28.6 pg (ref 26.0–34.0)
MCHC: 33.8 g/dL (ref 30.0–36.0)
MCV: 84.7 fL (ref 78.0–100.0)
Platelets: 253 10*3/uL (ref 150–400)
RBC: 3.67 MIL/uL — ABNORMAL LOW (ref 3.87–5.11)
RDW: 13.9 % (ref 11.5–15.5)
WBC: 10 10*3/uL (ref 4.0–10.5)

## 2013-10-17 MED ORDER — AMOXICILLIN-POT CLAVULANATE 875-125 MG PO TABS: 1.0000 | ORAL_TABLET | Freq: Two times a day (BID) | ORAL | Status: DC

## 2013-10-17 MED ORDER — DOCUSATE SODIUM 100 MG PO CAPS: 100.0000 mg | ORAL_CAPSULE | Freq: Two times a day (BID) | ORAL | Status: DC | PRN

## 2013-10-17 MED ORDER — TRAMADOL HCL 50 MG PO TABS: 50.0000 mg | ORAL_TABLET | Freq: Four times a day (QID) | ORAL | Status: DC | PRN

## 2013-10-17 NOTE — Discharge Instructions (Signed)
[facility]      [facility] [phone]  OPEN ABDOMINAL SURGERY: POST OP INSTRUCTIONS  Always review your discharge instruction sheet given to you by the facility where your surgery was performed.  IF YOU HAVE DISABILITY OR FAMILY LEAVE FORMS, YOU MUST BRING THEM TO THE OFFICE FOR PROCESSING.  PLEASE DO NOT GIVE THEM TO YOUR DOCTOR.  1. A prescription for pain medication may be given to you upon discharge.  Take your pain medication as prescribed, if needed.  If narcotic pain medicine is not needed, then you may take acetaminophen (Tylenol) or ibuprofen (Advil) as needed. 2. Take your usually prescribed medications unless otherwise directed. 3. If you need a refill on your pain medication, please contact your pharmacy. They will contact our office to request authorization.  Prescriptions will not be filled after 5pm or on week-ends. 4. You should follow a light diet the first few days after arrival home, such as soup and crackers, pudding, etc.unless your doctor has advised otherwise. A high-fiber, low fat diet can be resumed as tolerated.   Be sure to include lots of fluids daily. Most patients will experience some swelling and bruising on the chest and neck area.  Ice packs will help.  Swelling and bruising can take several days to resolve 5. Most patients will experience some swelling and bruising in the area of the incision. Ice pack will help. Swelling and bruising can take several days to resolve..  6. It is common to experience some constipation if taking pain medication after surgery.  Increasing fluid intake and taking a stool softener will usually help or prevent this problem from occurring.  A mild laxative (Milk of Magnesia or Miralax) should be taken according to package directions if there are no bowel movements after 48 hours. 7.  You may have steri-strips (small skin tapes) in place directly over the incision.  These strips should be left on the skin for 7-10 days.  If your  surgeon used skin glue on the incision, you may shower in 24 hours.  The glue will flake off over the next 2-3 weeks.  Any sutures or staples will be removed at the office during your follow-up visit. You may find that a light gauze bandage over your incision may keep your staples from being rubbed or pulled. You may shower and replace the bandage daily. 8. ACTIVITIES:  You may resume regular (light) daily activities beginning the next day--such as daily self-care, walking, climbing stairs--gradually increasing activities as tolerated.  You may have sexual intercourse when it is comfortable.  Refrain from any heavy lifting or straining until approved by your doctor. a. You may drive when you no longer are taking prescription pain medication, you can comfortably wear a seatbelt, and you can safely maneuver your car and apply brakes b. Return to Work: ___________________________________ 9. You should see your doctor in the office for a follow-up appointment approximately two weeks after your surgery.  Make sure that you call for this appointment within a day or two after you arrive home to insure a convenient appointment time. OTHER INSTRUCTIONS:  _____________________________________________________________ _____________________________________________________________  WHEN TO CALL YOUR DOCTOR: 1. Fever over 101.0 2. Inability to urinate 3. Nausea and/or vomiting 4. Extreme swelling or bruising 5. Continued bleeding from incision. 6. Increased pain, redness, or drainage from the incision. 7. Difficulty swallowing or breathing 8. Muscle cramping or spasms. 9. Numbness or tingling in hands or feet or around lips.  The clinic staff is available to   answer your questions during regular business hours.  Please don't hesitate to call and ask to speak to one of the nurses if you have concerns.  For further questions, please visit [email or web address]   

## 2013-10-17 NOTE — Progress Notes (Addendum)
Received referral from long length of stay meeting. Met with patient at bedside to explain and offer [facility] services. Consents obtained. Noted she will also have home health services. Explained to patient that [facility] will not interfere or replace services provided by home health. Left [facility] packet and contact information at bedside. She will receive a post hospital discharge call and will be evaluated for monthly home visits. Made inpatient RNCM aware of visit.  Atika Hall, MSN, RN,BSN- [facility] Hospital Liaison- [phone] 

## 2013-10-17 NOTE — Progress Notes (Signed)
6 Days Post-Op  Subjective: Pt doing much better.  C/o pain with dressing changes, but not taking pain meds as often as she has it available.  Up with PT moving well.  Tolerating diet and having good BM's in ostomy bag.    Objective: Vital signs in last 24 hours: Temp:  [97.9 F (36.6 C)-98 F (36.7 C)] 98 F (36.7 C) (02/03 0529) Pulse Rate:  [92-98] 98 (02/03 0529) Resp:  [17-18] 17 (02/03 0529) BP: (108-123)/(68-82) 110/68 mmHg (02/03 0529) SpO2:  [93 %-96 %] 96 % (02/03 0529) Last BM Date: 10/16/13  Intake/Output from previous day: 02/02 0701 - 02/03 0700 In: 600 [P.O.:600] Out: 2030 [Urine:1700; Stool:330] Intake/Output this shift: Total I/O In: 0  Out: 400 [Urine:400]  PE: Gen:  Alert, NAD, more pleasant but still anxious today Abd: Soft, moderate tenderness with dressing change, ND, great BS, no HSM, midline dressing clean (just changed), ostomy patent and pink with greenish brown output, good flatus in bag.    Lab Results:   Recent Labs  10/16/13 0910 10/17/13 0518  WBC 9.0 10.0  HGB 10.4* 10.5*  HCT 30.8* 31.1*  PLT 236 253   BMET  Recent Labs  10/16/13 0910  NA 141  K 3.8  CL 102  CO2 25  GLUCOSE 112*  BUN 10  CREATININE 1.00  CALCIUM 9.9   PT/INR No results found for this basename: LABPROT, INR,  in the last 72 hours CMP     Component Value Date/Time   NA 141 10/16/2013 0910   K 3.8 10/16/2013 0910   CL 102 10/16/2013 0910   CO2 25 10/16/2013 0910   GLUCOSE 112* 10/16/2013 0910   BUN 10 10/16/2013 0910   CREATININE 1.00 10/16/2013 0910   CALCIUM 9.9 10/16/2013 0910   PROT 7.8 10/09/2013 1015   ALBUMIN 3.4* 10/09/2013 1015   AST 23 10/09/2013 1015   ALT 25 10/09/2013 1015   ALKPHOS 97 10/09/2013 1015   BILITOT 0.5 10/09/2013 1015   GFRNONAA 57* 10/16/2013 0910   GFRAA 66* 10/16/2013 0910   Lipase     Component Value Date/Time   LIPASE 24 09/11/2013 2240       Studies/Results: No results found.  Anti-infectives: Anti-infectives   Start      Dose/Rate Route Frequency Ordered Stop   10/16/13 1600  ertapenem (INVANZ) 1 g in sodium chloride 0.9 % 50 mL IVPB  Status:  Discontinued     1 g 100 mL/hr over 30 Minutes Intravenous Every 24 hours 10/15/13 1628 10/16/13 0819   10/15/13 2200  amoxicillin-clavulanate (AUGMENTIN) 875-125 MG per tablet 1 tablet  Status:  Discontinued     1 tablet Oral Every 12 hours 10/15/13 1558 10/15/13 1558   10/15/13 1800  amoxicillin-clavulanate (AUGMENTIN) 875-125 MG per tablet 1 tablet     1 tablet Oral Every 12 hours 10/15/13 1556     10/14/13 1845  fluconazole (DIFLUCAN) tablet 100 mg     100 mg Oral Daily 10/14/13 1832 10/16/13 1111   10/10/13 1700  neomycin (MYCIFRADIN) tablet 1,000 mg     1,000 mg Oral 3 times per day on Tue 10/09/13 1629 10/10/13 2354   10/10/13 1700  erythromycin (E-MYCIN) tablet 1,000 mg     1,000 mg Oral 3 times per day on Tue 10/09/13 1629 10/10/13 2353   10/04/13 1400  ertapenem (INVANZ) 1 g in sodium chloride 0.9 % 50 mL IVPB  Status:  Discontinued    Comments:  First dose now.  1 g 100 mL/hr over 30 Minutes Intravenous Every 24 hours 10/04/13 1224 10/15/13 1623   09/29/13 2200  fluconazole (DIFLUCAN) tablet 150 mg     150 mg Oral  Once 09/29/13 0708 09/29/13 2118   09/29/13 0200  piperacillin-tazobactam (ZOSYN) IVPB 3.375 g  Status:  Discontinued     3.375 g 12.5 mL/hr over 240 Minutes Intravenous Every 8 hours 09/28/13 1743 10/04/13 1224   09/28/13 2000  fluconazole (DIFLUCAN) tablet 150 mg     150 mg Oral  Once 09/28/13 1842 09/28/13 2127   09/28/13 1800  piperacillin-tazobactam (ZOSYN) IVPB 3.375 g     3.375 g 12.5 mL/hr over 240 Minutes Intravenous  Once 09/28/13 1650 09/28/13 2249       Assessment/Plan 1. POD #6 s/p sigmoid colectomy with partial proctectomy with end sigmoid colostomy for Sigmoid diverticulitis with abscess.  -Admitted 09/10/2013 - 09/21/2013 for diverticulitis, but then was readmitted Friday, 09/30/2103 for recurrent disease.  -Went to IR  09/29/2013, but because of improvement of abscess - no drain was placed.  On Zosyn - 1/15 >>>> 1/21  On Invanz - 1/21 >>>> 10/15/13  Augmentin 10/15/13 >>>> ??? 2. HTN  3. Hypothyroid  4. Depression  5. History of Sickle cell trait  6. Hx of DVT in 20's on BCP (computer wrongly has her as having a PE) - up to this point pre-operatively has refused SQ heparin and lovenox, but has agreed to take lovenox post operatively for dvt prophlyaxis  7. Deconditioning   Plan:  1. PRN pain meds, robaxin  2. Tolerating soft diet with good BM's 3. Mobilize OOB and IS  4. SCD's and start Lovenox  5. PT recommending HH PT and 24 hour care as of now  6. WD dressing changes BID  7. Likely ready for d/c today, will check on patient after lunch      LOS: 19 days    DORT, Tzvi Economou 10/17/2013, 10:03 AM Pager: [phone]

## 2013-10-17 NOTE — Discharge Summary (Signed)
Followup with Dr. Barry Dienes for wound care.  Dr. Marcello Moores, colorectal surgeon in our group, aware.  She may be taking care of the patient for Dr. Barry Dienes.  She anticipates waiting on colostomy takedown until at least 6 months given massive inflammation and pus.

## 2013-10-17 NOTE — Progress Notes (Signed)
Physical Therapy Treatment Patient Details Name: Valerie Jackson MRN: [id number] DOB: Aug 01, 1946 Today's Date: 10/17/2013 Time: 2449-7530 PT Time Calculation (min): 38 min  PT Assessment / Plan / Recommendation  History of Present Illness 68 yo female s/p sigmoid colectomy, partial proctectomy, end colostomy 1/28. Hx of sickle cell, DVT, HTN   PT Comments   Assisted pt OOB (flat) with increased time and 25% VC's on tech to minimize ABD pain. Applied ABD binder while sitting then tighten to comfort while standing. Amb in hallway with RW.  Practiced going up/down 12 steps with one rail.  Pt required rest breaks between activity due to c/o feeling "woozy" esp when descending steps such that pt had to sit on the steps to rest briefly.  Returned to room via recliner with MAX c/o fatigue.   Follow Up Recommendations  Home health PT;Supervision/Assistance - 24 hour     Does the patient have the potential to tolerate intense rehabilitation     Barriers to Discharge        Equipment Recommendations  Rolling walker with 5" wheels    Recommendations for Other Services    Frequency     Progress towards PT Goals Progress towards PT goals: Progressing toward goals  Plan Current plan remains appropriate    Precautions / Restrictions Precautions Precaution Comments: abdominal surgery. colostomy.Abd binder for comfort Restrictions Weight Bearing Restrictions: No    Pertinent Vitals/Pain C/o ABD pain 5/10    Mobility  Bed Mobility Overal bed mobility: Needs Assistance Bed Mobility: Sidelying to Sit;Sit to Sidelying Rolling: Min guard Sidelying to sit: Min assist;Mod assist General bed mobility comments: increased time, Pt has more difficulty getting out of bed, encouraged pt to roll to side with B knees flex.  Transfers Overall transfer level: Needs assistance Equipment used: Rolling walker (2 wheeled) Transfers: Sit to/from Stand Sit to Stand: Supervision;Min guard General transfer  comment: Increased time. Multimodal cues for safety, technique, hand placement. Assist to rise, stabilize, control descent.  Ambulation/Gait Ambulation/Gait assistance: Min guard;Min assist Ambulation Distance (Feet): 135 Feet (95' 20', 20' sitting rest break) Assistive device: Rolling walker (2 wheeled) Gait Pattern/deviations: Trunk flexed;Decreased stride length;Step-through pattern Gait velocity: decreased General Gait Details: slow  speed, cues for posture. Stairs: Yes Stairs assistance: Min assist Stair Management: One rail Right Number of Stairs: 12 General stair comments: did well going up steps and recepricating steps but coming down pt c/o increased feeling "woozy" and had to sit on the steps to rest.  Pt sat a few min then cont the remaining steps to bottom.      PT Goals (current goals can now be found in the care plan section)    Visit Information  Last PT Received On: 10/17/13 Assistance Needed: +1 History of Present Illness: 68 yo female s/p sigmoid colectomy, partial proctectomy, end colostomy 1/28. Hx of sickle cell, DVT, HTN    Subjective Data      Cognition       Balance     End of Session PT - End of Session Equipment Utilized During Treatment: Gait belt (ABD binder) Activity Tolerance: Patient limited by fatigue Patient left: in chair;with call bell/phone within reach   Rica Koyanagi  PTA [facility]  Acute  Rehab Pager      [phone]

## 2013-10-17 NOTE — Consult Note (Signed)
WOC ostomy consult note Stoma type/location:  LLQ Colostomy Stomal assessment/size: 1 3/8" round, budded stoma Peristomal assessment: Intact.  Surgical incision 3 cm from stoma.  Barrier edge trimmed to accomodate incision.  Treatment options for stomal/peristomal skin:  Prefers skin prep barrier wipe to protect peristomal skin.   Output Liquid brown stool.  Ostomy pouching: ./2pc.  Used 2 1/4" 2 piece system today due to close proximity of surgical incision. Barrier ring also used at patient has small skin fold at 9 o'clock, creating a leak from ostomy pouch into surgical wound.  Education provided: Explained need for barrier ring and trimming edge of barrier.  Wound care provided as dressing had become soiled with ostomy leak.  NS moist to moist dressing placed over surgical site.       Outcomes/Evaluations:   Communicated understanding   Demonstrated skill Patient was able to complete the pouch change with coaching and states she feels comfortable.  Expressed discomfort with abdominal surgical incision dressing change.   Domenic Moras RN BSN [place] Pager [phone]

## 2013-10-17 NOTE — Progress Notes (Signed)
Discharge instructions reviewed with the pt.  Pt verbalized understanding of discharge instructions and new medications.  Home health set up for pt for wound and ostomy care.  Rolling walker delivered to pt's room and sent home with pt. No concerns voiced at time of discharge.

## 2013-10-17 NOTE — Plan of Care (Signed)
Problem: Phase I Progression Outcomes Goal: Pain controlled with appropriate interventions Outcome: Progressing Pain controlled to patients comfort level.  Problem: Phase II Progression Outcomes Goal: Vital signs stable Outcome: Progressing Vital signs are stable.  Goal: Surgical site without signs of infection Outcome: Progressing Surgical site appears to be without signs of infection. No redness or warmth present.  Goal: Dressings dry/intact Outcome: Progressing Dressing changed and remains dry and intact.  Goal: Foley discontinued Outcome: Progressing No foley in place.  Goal: Tolerating diet Outcome: Progressing Pt has poor appetite but tolerating diet. Pt requests resource breeze drinks throughout the night.

## 2013-10-17 NOTE — Progress Notes (Signed)
Close to discharge.  Making all basic goals.  Hopefully home later today.

## 2013-10-17 NOTE — Discharge Summary (Signed)
Physician Discharge Summary  Patient ID: Valerie Jackson MRN: [id number] DOB/AGE: Jan 18, 1946 68 y.o.  Admit date: 09/28/2013 Discharge date: 10/17/2013  Admitting Diagnosis: Diverticulitis of colon with perforation and abscess Obesity 30-39.9 Sickle cell trait  Discharge Diagnosis Patient Active Problem List   Diagnosis Date Noted  . Diverticulitis of colon with perforation 09/28/2013  . Pericolonic abscess due to diverticulitis 09/12/2013  . Diverticulitis large intestine 09/12/2013  . Nonspecific elevation of levels of transaminase or lactic acid dehydrogenase (LDH) 07/19/2013  . Sickle cell trait 07/19/2013  . Obesity (BMI 30-39.9) 04/12/2013  . Hypokalemia 02/28/2013  . Other and unspecified hyperlipidemia 02/28/2013  . Hypertension 02/28/2013  . Unspecified hypothyroidism 02/28/2013    Consultants [place] [facility] ([facility])  Imaging: No results found.  Procedures Dr. Barry Dienes (10/11/12) - Sigmoid colectomy (Hartmann's) in conjunction with partial proctectomy with end sigmoid colostomy   Hospital Course:  68 year old female recently hospitalized with acute diverticulitis with abscess formation. Patient was treated with bowel rest and intravenous antibiotics. She was discharged home on 09/21/13 on oral Augmentin. Patient has continued to have lower abdominal pain. She was eating a very limited diet. She was having loose bowel movements. Patient underwent a CT scan of the abdomen and pelvis on 09/27/2013. This shows persistent acute diverticulitis involving the sigmoid colon. There is a fistula present and a complex abscess in the pelvis measuring 4.8 cm in size, increased from her prior study. Patient presents on 09/28/13 for evaluation on recommendation from her medical doctor.  We treated the patient conservatively for the first few days.  On 09/29/13 she underwent a CT guided percutaneous drainage of her abdominal abscess, no drain was placed.  During  her hospital stay the patient refused administration of heparin for DVT prophylaxis.  After drainage of abscess by IR the patient started to improve and was started on a clear liquid diet.  She was continued on IV antibiotics and clear liquids, but her pain persisted despite another week of conservative management.  Her antibiotics were switched from Zosyn to [place] on 10/04/13 but did not see any improvement over the next week, thus the decision was made to proceed with sigmoid colon resection and colostomy on 10/11/13.  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  Patient underwent procedure listed above.  Tolerated procedure well and was transferred to the floor.  She did agree to take lovenox daily post-operatively.  Diet was advanced to clears on POD #1 and slowly advanced to a soft diet by the day of discharge.  On POD #6, the patient was voiding well, tolerating diet, ambulating well, pain well controlled, vital signs stable, incisions c/d/i and felt stable for discharge home.  Physical therapy worked with the patient for several day and recommended 24 hour supervision and HH PT and rolling walker.  She will have [facility], nursing for wound checks and ostomy care.  She will be released home with the help of her daughter.  DME wheeling walker ordered.  Patient will follow up in our office in 2-3 weeks with Dr. Barry Dienes and knows to call with questions or concerns.  She should f/u with her PCP upon discharge as well.        Medication List    STOP taking these medications       oxyCODONE-acetaminophen 5-325 MG per tablet  Commonly known as:  PERCOCET/ROXICET      TAKE these medications  amoxicillin-clavulanate 875-125 MG per tablet  Commonly known as:  AUGMENTIN  Take 1 tablet by mouth every 12 (twelve) hours.     docusate sodium 100 MG capsule  Commonly known as:  COLACE  Take 1 capsule (100 mg total) by mouth 2 (two)  times daily as needed for mild constipation.     levothyroxine 125 MCG tablet  Commonly known as:  SYNTHROID, LEVOTHROID  Take 1 tablet (125 mcg total) by mouth daily.     MIRALAX PO  Take by mouth.     ondansetron 4 MG tablet  Commonly known as:  ZOFRAN  Take 1 tablet (4 mg total) by mouth every 6 (six) hours as needed for nausea.     potassium chloride 10 MEQ tablet  Commonly known as:  K-DUR  Take 1 tablet (10 mEq total) by mouth 2 (two) times daily.     traMADol 50 MG tablet  Commonly known as:  ULTRAM  Take 1-2 tablets (50-100 mg total) by mouth every 6 (six) hours as needed for moderate pain or severe pain.             Follow-up Information   Schedule an appointment as soon as possible for a visit with Stark Klein, MD.   Specialty:  General Surgery   Contact information:   [address] [place] [address] [phone]       Schedule an appointment as soon as possible for a visit with [name], MD.   Specialty:  Internal Medicine   Contact information:   [address] [place], [place] [address] [phone]       Signed: [facility] [phone]  10/17/2013, 3:10 PM

## 2013-10-18 ENCOUNTER — Ambulatory Visit: Payer: Medicare Other | Admitting: Internal Medicine

## 2013-10-18 DIAGNOSIS — I10 Essential (primary) hypertension: Secondary | ICD-10-CM | POA: Diagnosis not present

## 2013-10-18 DIAGNOSIS — R5381 Other malaise: Secondary | ICD-10-CM | POA: Diagnosis not present

## 2013-10-18 DIAGNOSIS — F3289 Other specified depressive episodes: Secondary | ICD-10-CM | POA: Diagnosis not present

## 2013-10-18 DIAGNOSIS — K630 Abscess of intestine: Secondary | ICD-10-CM | POA: Diagnosis not present

## 2013-10-18 DIAGNOSIS — Z433 Encounter for attention to colostomy: Secondary | ICD-10-CM | POA: Diagnosis not present

## 2013-10-18 DIAGNOSIS — F329 Major depressive disorder, single episode, unspecified: Secondary | ICD-10-CM | POA: Diagnosis not present

## 2013-10-18 DIAGNOSIS — K5732 Diverticulitis of large intestine without perforation or abscess without bleeding: Secondary | ICD-10-CM | POA: Diagnosis not present

## 2013-10-18 DIAGNOSIS — Z48815 Encounter for surgical aftercare following surgery on the digestive system: Secondary | ICD-10-CM | POA: Diagnosis not present

## 2013-10-18 DIAGNOSIS — E669 Obesity, unspecified: Secondary | ICD-10-CM | POA: Diagnosis not present

## 2013-10-19 DIAGNOSIS — F329 Major depressive disorder, single episode, unspecified: Secondary | ICD-10-CM | POA: Diagnosis not present

## 2013-10-19 DIAGNOSIS — Z48815 Encounter for surgical aftercare following surgery on the digestive system: Secondary | ICD-10-CM | POA: Diagnosis not present

## 2013-10-19 DIAGNOSIS — I10 Essential (primary) hypertension: Secondary | ICD-10-CM | POA: Diagnosis not present

## 2013-10-19 DIAGNOSIS — K5732 Diverticulitis of large intestine without perforation or abscess without bleeding: Secondary | ICD-10-CM | POA: Diagnosis not present

## 2013-10-19 DIAGNOSIS — K630 Abscess of intestine: Secondary | ICD-10-CM | POA: Diagnosis not present

## 2013-10-19 DIAGNOSIS — F3289 Other specified depressive episodes: Secondary | ICD-10-CM | POA: Diagnosis not present

## 2013-10-19 DIAGNOSIS — Z433 Encounter for attention to colostomy: Secondary | ICD-10-CM | POA: Diagnosis not present

## 2013-10-20 DIAGNOSIS — F329 Major depressive disorder, single episode, unspecified: Secondary | ICD-10-CM | POA: Diagnosis not present

## 2013-10-20 DIAGNOSIS — K630 Abscess of intestine: Secondary | ICD-10-CM | POA: Diagnosis not present

## 2013-10-20 DIAGNOSIS — Z433 Encounter for attention to colostomy: Secondary | ICD-10-CM | POA: Diagnosis not present

## 2013-10-20 DIAGNOSIS — F3289 Other specified depressive episodes: Secondary | ICD-10-CM | POA: Diagnosis not present

## 2013-10-20 DIAGNOSIS — Z48815 Encounter for surgical aftercare following surgery on the digestive system: Secondary | ICD-10-CM | POA: Diagnosis not present

## 2013-10-20 DIAGNOSIS — I10 Essential (primary) hypertension: Secondary | ICD-10-CM | POA: Diagnosis not present

## 2013-10-20 DIAGNOSIS — K5732 Diverticulitis of large intestine without perforation or abscess without bleeding: Secondary | ICD-10-CM | POA: Diagnosis not present

## 2013-10-23 ENCOUNTER — Telehealth (INDEPENDENT_AMBULATORY_CARE_PROVIDER_SITE_OTHER): Payer: Self-pay

## 2013-10-23 DIAGNOSIS — F329 Major depressive disorder, single episode, unspecified: Secondary | ICD-10-CM | POA: Diagnosis not present

## 2013-10-23 DIAGNOSIS — K5732 Diverticulitis of large intestine without perforation or abscess without bleeding: Secondary | ICD-10-CM | POA: Diagnosis not present

## 2013-10-23 DIAGNOSIS — K630 Abscess of intestine: Secondary | ICD-10-CM | POA: Diagnosis not present

## 2013-10-23 DIAGNOSIS — Z433 Encounter for attention to colostomy: Secondary | ICD-10-CM | POA: Diagnosis not present

## 2013-10-23 DIAGNOSIS — Z48815 Encounter for surgical aftercare following surgery on the digestive system: Secondary | ICD-10-CM | POA: Diagnosis not present

## 2013-10-23 DIAGNOSIS — I10 Essential (primary) hypertension: Secondary | ICD-10-CM | POA: Diagnosis not present

## 2013-10-23 DIAGNOSIS — F3289 Other specified depressive episodes: Secondary | ICD-10-CM | POA: Diagnosis not present

## 2013-10-23 NOTE — Telephone Encounter (Signed)
Patient d/c on 10/17/13 from Exploratory Laparotomy on 10/11/13.  Patient d/c medications have Potassium listed.  Patient unsure if she should be taking Potassium and if so she would need a prescription.  Please advise.

## 2013-10-23 NOTE — Telephone Encounter (Signed)
Lets recheck labs and see.  Get bmet please.  tx

## 2013-10-24 DIAGNOSIS — K5732 Diverticulitis of large intestine without perforation or abscess without bleeding: Secondary | ICD-10-CM | POA: Diagnosis not present

## 2013-10-24 DIAGNOSIS — F329 Major depressive disorder, single episode, unspecified: Secondary | ICD-10-CM | POA: Diagnosis not present

## 2013-10-24 DIAGNOSIS — Z433 Encounter for attention to colostomy: Secondary | ICD-10-CM | POA: Diagnosis not present

## 2013-10-24 DIAGNOSIS — K630 Abscess of intestine: Secondary | ICD-10-CM | POA: Diagnosis not present

## 2013-10-24 DIAGNOSIS — Z48815 Encounter for surgical aftercare following surgery on the digestive system: Secondary | ICD-10-CM | POA: Diagnosis not present

## 2013-10-24 DIAGNOSIS — I10 Essential (primary) hypertension: Secondary | ICD-10-CM | POA: Diagnosis not present

## 2013-10-24 DIAGNOSIS — F3289 Other specified depressive episodes: Secondary | ICD-10-CM | POA: Diagnosis not present

## 2013-10-26 ENCOUNTER — Other Ambulatory Visit (INDEPENDENT_AMBULATORY_CARE_PROVIDER_SITE_OTHER): Payer: Self-pay | Admitting: General Surgery

## 2013-10-26 DIAGNOSIS — I10 Essential (primary) hypertension: Secondary | ICD-10-CM | POA: Diagnosis not present

## 2013-10-26 DIAGNOSIS — F329 Major depressive disorder, single episode, unspecified: Secondary | ICD-10-CM | POA: Diagnosis not present

## 2013-10-26 DIAGNOSIS — Z433 Encounter for attention to colostomy: Secondary | ICD-10-CM | POA: Diagnosis not present

## 2013-10-26 DIAGNOSIS — K630 Abscess of intestine: Secondary | ICD-10-CM | POA: Diagnosis not present

## 2013-10-26 DIAGNOSIS — Z48815 Encounter for surgical aftercare following surgery on the digestive system: Secondary | ICD-10-CM | POA: Diagnosis not present

## 2013-10-26 DIAGNOSIS — K5732 Diverticulitis of large intestine without perforation or abscess without bleeding: Secondary | ICD-10-CM | POA: Diagnosis not present

## 2013-10-26 DIAGNOSIS — Z9889 Other specified postprocedural states: Secondary | ICD-10-CM

## 2013-10-26 DIAGNOSIS — F3289 Other specified depressive episodes: Secondary | ICD-10-CM | POA: Diagnosis not present

## 2013-10-26 NOTE — Telephone Encounter (Signed)
Pt will need labs drawn.  Order is in [place].

## 2013-10-30 DIAGNOSIS — K630 Abscess of intestine: Secondary | ICD-10-CM | POA: Diagnosis not present

## 2013-10-30 DIAGNOSIS — Z48815 Encounter for surgical aftercare following surgery on the digestive system: Secondary | ICD-10-CM | POA: Diagnosis not present

## 2013-10-30 DIAGNOSIS — F329 Major depressive disorder, single episode, unspecified: Secondary | ICD-10-CM | POA: Diagnosis not present

## 2013-10-30 DIAGNOSIS — F3289 Other specified depressive episodes: Secondary | ICD-10-CM | POA: Diagnosis not present

## 2013-10-30 DIAGNOSIS — K5732 Diverticulitis of large intestine without perforation or abscess without bleeding: Secondary | ICD-10-CM | POA: Diagnosis not present

## 2013-10-30 DIAGNOSIS — I10 Essential (primary) hypertension: Secondary | ICD-10-CM | POA: Diagnosis not present

## 2013-10-30 DIAGNOSIS — Z433 Encounter for attention to colostomy: Secondary | ICD-10-CM | POA: Diagnosis not present

## 2013-10-31 ENCOUNTER — Ambulatory Visit: Payer: Medicare Other | Admitting: Internal Medicine

## 2013-11-02 DIAGNOSIS — K5732 Diverticulitis of large intestine without perforation or abscess without bleeding: Secondary | ICD-10-CM | POA: Diagnosis not present

## 2013-11-02 DIAGNOSIS — F3289 Other specified depressive episodes: Secondary | ICD-10-CM | POA: Diagnosis not present

## 2013-11-02 DIAGNOSIS — F329 Major depressive disorder, single episode, unspecified: Secondary | ICD-10-CM | POA: Diagnosis not present

## 2013-11-02 DIAGNOSIS — Z433 Encounter for attention to colostomy: Secondary | ICD-10-CM | POA: Diagnosis not present

## 2013-11-02 DIAGNOSIS — K630 Abscess of intestine: Secondary | ICD-10-CM | POA: Diagnosis not present

## 2013-11-02 DIAGNOSIS — Z48815 Encounter for surgical aftercare following surgery on the digestive system: Secondary | ICD-10-CM | POA: Diagnosis not present

## 2013-11-02 DIAGNOSIS — I10 Essential (primary) hypertension: Secondary | ICD-10-CM | POA: Diagnosis not present

## 2013-11-06 ENCOUNTER — Ambulatory Visit (INDEPENDENT_AMBULATORY_CARE_PROVIDER_SITE_OTHER): Payer: Medicare Other | Admitting: General Surgery

## 2013-11-06 ENCOUNTER — Ambulatory Visit: Payer: Medicare Other | Admitting: Internal Medicine

## 2013-11-06 ENCOUNTER — Encounter (INDEPENDENT_AMBULATORY_CARE_PROVIDER_SITE_OTHER): Payer: Self-pay | Admitting: General Surgery

## 2013-11-06 VITALS — BP 130/86 | HR 98 | Temp 98.4°F | Resp 18 | Ht 66.0 in | Wt 178.4 lb

## 2013-11-06 DIAGNOSIS — K5732 Diverticulitis of large intestine without perforation or abscess without bleeding: Secondary | ICD-10-CM

## 2013-11-06 DIAGNOSIS — K5720 Diverticulitis of large intestine with perforation and abscess without bleeding: Secondary | ICD-10-CM

## 2013-11-06 NOTE — Patient Instructions (Signed)
Continue wound/ostomy care  Follow up with me in 3-4 weeks.

## 2013-11-06 NOTE — Assessment & Plan Note (Signed)
Pt at expected level of recovery.  Cauterized hypertrophic granulation tissue.  Follow up in 3-4 weeks to assess wound.

## 2013-11-06 NOTE — Progress Notes (Signed)
HISTORY: The patient is a 68 year old female approximately 4 weeks status post Hartman's procedure for unresolving diverticulitis.  The patient was noted to have a small abscess behind the uterus and cervix. She had her midline wound left open.  She has been getting dressing changes. She complains of a foul odor from the drainage from her wound. She also complains of skin irritation from tape. She has occasional left-sided pains that shoot up towards the ostomy. She is getting very fatigued when she does even the smallest activity.   EXAM: General:  Alert and oriented.  Well dressed/groomed.   Incision:  ? Small amount of leakage from ostomy into wound.  Wound actually looks good, with no evidence of tunneling.  Cauterized with silver nitrate.    PATHOLOGY: Diverticuli, hyperplastic polyps.     ASSESSMENT AND PLAN:   Diverticulitis of colon with perforation Pt at expected level of recovery.  Cauterized hypertrophic granulation tissue.  Follow up in 3-4 weeks to assess wound.     She will need colonoscopy prior to ostomy takedown.     Milus Height, MD Surgical Oncology, [facility]  [name], MD Janith Lima, MD

## 2013-11-08 ENCOUNTER — Ambulatory Visit (INDEPENDENT_AMBULATORY_CARE_PROVIDER_SITE_OTHER): Payer: Medicare Other | Admitting: Internal Medicine

## 2013-11-08 ENCOUNTER — Encounter: Payer: Self-pay | Admitting: Internal Medicine

## 2013-11-08 ENCOUNTER — Other Ambulatory Visit (INDEPENDENT_AMBULATORY_CARE_PROVIDER_SITE_OTHER): Payer: Medicare Other

## 2013-11-08 ENCOUNTER — Ambulatory Visit: Payer: Medicare Other | Admitting: Internal Medicine

## 2013-11-08 VITALS — BP 122/80 | HR 86 | Temp 98.1°F | Resp 16 | Ht 66.0 in | Wt 178.5 lb

## 2013-11-08 DIAGNOSIS — I10 Essential (primary) hypertension: Secondary | ICD-10-CM | POA: Diagnosis not present

## 2013-11-08 DIAGNOSIS — F3289 Other specified depressive episodes: Secondary | ICD-10-CM | POA: Diagnosis not present

## 2013-11-08 DIAGNOSIS — E039 Hypothyroidism, unspecified: Secondary | ICD-10-CM

## 2013-11-08 DIAGNOSIS — J449 Chronic obstructive pulmonary disease, unspecified: Secondary | ICD-10-CM

## 2013-11-08 DIAGNOSIS — K5732 Diverticulitis of large intestine without perforation or abscess without bleeding: Secondary | ICD-10-CM | POA: Diagnosis not present

## 2013-11-08 DIAGNOSIS — R7401 Elevation of levels of liver transaminase levels: Secondary | ICD-10-CM | POA: Diagnosis not present

## 2013-11-08 DIAGNOSIS — R791 Abnormal coagulation profile: Secondary | ICD-10-CM

## 2013-11-08 DIAGNOSIS — D510 Vitamin B12 deficiency anemia due to intrinsic factor deficiency: Secondary | ICD-10-CM

## 2013-11-08 DIAGNOSIS — D573 Sickle-cell trait: Secondary | ICD-10-CM

## 2013-11-08 DIAGNOSIS — R7402 Elevation of levels of lactic acid dehydrogenase (LDH): Secondary | ICD-10-CM | POA: Diagnosis not present

## 2013-11-08 DIAGNOSIS — R7989 Other specified abnormal findings of blood chemistry: Secondary | ICD-10-CM

## 2013-11-08 DIAGNOSIS — Z433 Encounter for attention to colostomy: Secondary | ICD-10-CM | POA: Diagnosis not present

## 2013-11-08 DIAGNOSIS — R0989 Other specified symptoms and signs involving the circulatory and respiratory systems: Secondary | ICD-10-CM

## 2013-11-08 DIAGNOSIS — R0600 Dyspnea, unspecified: Secondary | ICD-10-CM

## 2013-11-08 DIAGNOSIS — R0609 Other forms of dyspnea: Secondary | ICD-10-CM

## 2013-11-08 DIAGNOSIS — R740 Nonspecific elevation of levels of transaminase and lactic acid dehydrogenase [LDH]: Secondary | ICD-10-CM

## 2013-11-08 DIAGNOSIS — J439 Emphysema, unspecified: Secondary | ICD-10-CM | POA: Insufficient documentation

## 2013-11-08 DIAGNOSIS — K630 Abscess of intestine: Secondary | ICD-10-CM | POA: Diagnosis not present

## 2013-11-08 DIAGNOSIS — F329 Major depressive disorder, single episode, unspecified: Secondary | ICD-10-CM | POA: Diagnosis not present

## 2013-11-08 DIAGNOSIS — Z48815 Encounter for surgical aftercare following surgery on the digestive system: Secondary | ICD-10-CM | POA: Diagnosis not present

## 2013-11-08 LAB — CBC WITH DIFFERENTIAL/PLATELET
Basophils Absolute: 0 10*3/uL (ref 0.0–0.1)
Basophils Relative: 0.5 % (ref 0.0–3.0)
Eosinophils Absolute: 0.8 10*3/uL — ABNORMAL HIGH (ref 0.0–0.7)
Eosinophils Relative: 10.1 % — ABNORMAL HIGH (ref 0.0–5.0)
HCT: 33.9 % — ABNORMAL LOW (ref 36.0–46.0)
Hemoglobin: 11.1 g/dL — ABNORMAL LOW (ref 12.0–15.0)
Lymphocytes Relative: 28.8 % (ref 12.0–46.0)
Lymphs Abs: 2.3 10*3/uL (ref 0.7–4.0)
MCHC: 32.8 g/dL (ref 30.0–36.0)
MCV: 89.2 fl (ref 78.0–100.0)
Monocytes Absolute: 0.6 10*3/uL (ref 0.1–1.0)
Monocytes Relative: 7.6 % (ref 3.0–12.0)
Neutro Abs: 4.3 10*3/uL (ref 1.4–7.7)
Neutrophils Relative %: 53 % (ref 43.0–77.0)
Platelets: 244 10*3/uL (ref 150.0–400.0)
RBC: 3.8 Mil/uL — ABNORMAL LOW (ref 3.87–5.11)
RDW: 16 % — ABNORMAL HIGH (ref 11.5–14.6)
WBC: 8.1 10*3/uL (ref 4.5–10.5)

## 2013-11-08 LAB — CARDIAC PANEL
CK-MB: 0.8 ng/mL (ref 0.3–4.0)
Relative Index: 3.5 calc — ABNORMAL HIGH (ref 0.0–2.5)
Total CK: 23 U/L (ref 7–177)

## 2013-11-08 LAB — D-DIMER, QUANTITATIVE: D-Dimer, Quant: 1.3 ug/mL-FEU — ABNORMAL HIGH (ref 0.00–0.48)

## 2013-11-08 LAB — IBC PANEL
Iron: 59 ug/dL (ref 42–145)
Saturation Ratios: 21.2 % (ref 20.0–50.0)
Transferrin: 198.7 mg/dL — ABNORMAL LOW (ref 212.0–360.0)

## 2013-11-08 LAB — TSH: TSH: 0.36 u[IU]/mL (ref 0.35–5.50)

## 2013-11-08 LAB — FERRITIN: Ferritin: 150.8 ng/mL (ref 10.0–291.0)

## 2013-11-08 LAB — TROPONIN I: Troponin I: <0.01 ng/mL (ref <0.06)

## 2013-11-08 NOTE — Patient Instructions (Signed)
Shortness of Breath Shortness of breath means you have trouble breathing. Shortness of breath may indicate that you have a medical problem. You should seek immediate medical care for shortness of breath. CAUSES   Not enough oxygen in the air (as with high altitudes or a smoke-filled room).  Short-term (acute) lung disease, including:  Infections, such as pneumonia.  Fluid in the lungs, such as heart failure.  A blood clot in the lungs (pulmonary embolism).  Long-term (chronic) lung diseases.  Heart disease (heart attack, angina, heart failure, and others).  Low red blood cells (anemia).  Poor physical fitness. This can cause shortness of breath when you exercise.  Chest or back injuries or stiffness.  Being overweight.  Smoking.  Anxiety. This can make you feel like you are not getting enough air. DIAGNOSIS  Serious medical problems can usually be found during your physical exam. Tests may also be done to determine why you are having shortness of breath. Tests may include:  Chest X-rays.  Lung function tests.  Blood tests.  Electrocardiography.  Exercise testing.  Echocardiography.  Imaging scans. Your caregiver may not be able to find a cause for your shortness of breath after your exam. In this case, it is important to have a follow-up exam with your caregiver as directed.  TREATMENT  Treatment for shortness of breath depends on the cause of your symptoms and can vary greatly. HOME CARE INSTRUCTIONS   Do not smoke. Smoking is a common cause of shortness of breath. If you smoke, ask for help to quit.  Avoid being around chemicals or things that may bother your breathing, such as paint fumes and dust.  Rest as needed. Slowly resume your usual activities.  If medicines were prescribed, take them as directed for the full length of time directed. This includes oxygen and any inhaled medicines.  Keep all follow-up appointments as directed by your caregiver. SEEK  MEDICAL CARE IF:   Your condition does not improve in the time expected.  You have a hard time doing your normal activities even with rest.  You have any side effects or problems with the medicines prescribed.  You develop any new symptoms. SEEK IMMEDIATE MEDICAL CARE IF:   Your shortness of breath gets worse.  You feel lightheaded, faint, or develop a cough not controlled with medicines.  You start coughing up blood.  You have pain with breathing.  You have chest pain or pain in your arms, shoulders, or abdomen.  You have a fever.  You are unable to walk up stairs or exercise the way you normally do. MAKE SURE YOU:  Understand these instructions.  Will watch your condition.  Will get help right away if you are not doing well or get worse. Document Released: 05/26/2001 Document Revised: 03/01/2012 Document Reviewed: 11/16/2011 [facility] Patient Information 2014 [place].

## 2013-11-08 NOTE — Progress Notes (Signed)
Pre visit review using our clinic review tool, if applicable. No additional management support is needed unless otherwise documented below in the visit note. 

## 2013-11-08 NOTE — Progress Notes (Signed)
Subjective:    Patient ID: Valerie Jackson, female    DOB: May 09, 1946, 68 y.o.   MRN: [id number]  Shortness of Breath This is a new problem. The current episode started in the past 7 days. The problem occurs intermittently. The problem has been unchanged. Pertinent negatives include no abdominal pain, chest pain, claudication, coryza, ear pain, fever, headaches, hemoptysis, leg pain, leg swelling, neck pain, orthopnea, PND, rash, rhinorrhea, sore throat, sputum production, swollen glands, syncope, vomiting or wheezing. The symptoms are aggravated by exercise. She has tried nothing for the symptoms. The treatment provided no relief. Her past medical history is significant for a recent surgery. There is no history of allergies, aspirin allergies, asthma, bronchiolitis, CAD, chronic lung disease, COPD, DVT, a heart failure, PE or pneumonia.      Review of Systems  Constitutional: Positive for fatigue. Negative for fever, chills, diaphoresis, activity change, appetite change and unexpected weight change.  HENT: Negative.  Negative for ear pain, rhinorrhea and sore throat.   Eyes: Negative.   Respiratory: Positive for shortness of breath. Negative for apnea, cough, hemoptysis, sputum production, choking, chest tightness, wheezing and stridor.   Cardiovascular: Negative.  Negative for chest pain, palpitations, orthopnea, claudication, leg swelling, syncope and PND.  Gastrointestinal: Negative.  Negative for nausea, vomiting, abdominal pain, diarrhea, constipation, blood in stool, abdominal distention, anal bleeding and rectal pain.  Endocrine: Negative.   Genitourinary: Negative.   Musculoskeletal: Negative.  Negative for neck pain.  Skin: Negative.  Negative for rash.  Allergic/Immunologic: Negative.   Neurological: Positive for light-headedness. Negative for dizziness, tremors, facial asymmetry, weakness, numbness and headaches.  Hematological: Negative.  Negative for adenopathy. Does not  bruise/bleed easily.  Psychiatric/Behavioral: Negative.        Objective:   Physical Exam  Vitals reviewed. Constitutional: She is oriented to person, place, and time. She appears well-developed and well-nourished. No distress.  HENT:  Head: Normocephalic and atraumatic.  Mouth/Throat: Oropharynx is clear and moist. No oropharyngeal exudate.  Eyes: Conjunctivae are normal. Right eye exhibits no discharge. Left eye exhibits no discharge. No scleral icterus.  Neck: Normal range of motion. Neck supple. No JVD present. No tracheal deviation present. No thyromegaly present.  Cardiovascular: Normal rate, regular rhythm, normal heart sounds and intact distal pulses.  Exam reveals no gallop and no friction rub.   No murmur heard. Pulmonary/Chest: Effort normal and breath sounds normal. No stridor. No respiratory distress. She has no wheezes. She has no rales. She exhibits no tenderness.  Abdominal: Soft. Bowel sounds are normal. She exhibits no distension and no mass. There is no tenderness. There is no rebound and no guarding.  Musculoskeletal: Normal range of motion. She exhibits no edema and no tenderness.  Lymphadenopathy:    She has no cervical adenopathy.  Neurological: She is oriented to person, place, and time.  Skin: Skin is warm and dry. No rash noted. She is not diaphoretic. No erythema. No pallor.  Psychiatric: She has a normal mood and affect. Her behavior is normal. Judgment and thought content normal.     Lab Results  Component Value Date   WBC 10.0 10/17/2013   HGB 10.5* 10/17/2013   HCT 31.1* 10/17/2013   PLT 253 10/17/2013   GLUCOSE 112* 10/16/2013   CHOL 130 07/19/2013   TRIG 135.0 07/19/2013   HDL 53.20 07/19/2013   LDLDIRECT 226.0 02/27/2013   LDLCALC 50 07/19/2013   ALT 25 10/09/2013   AST 23 10/09/2013   NA 141 10/16/2013   K  3.8 10/16/2013   CL 102 10/16/2013   CREATININE 1.00 10/16/2013   BUN 10 10/16/2013   CO2 25 10/16/2013   TSH 0.111* 09/12/2013   INR 1.15 09/18/2013   HGBA1C  4.7 02/27/2013       Assessment & Plan:

## 2013-11-09 ENCOUNTER — Encounter: Payer: Self-pay | Admitting: Internal Medicine

## 2013-11-09 NOTE — Assessment & Plan Note (Signed)
Her TSH is in the normal range

## 2013-11-09 NOTE — Assessment & Plan Note (Signed)
The anemia is some better, her iron and ferritin are WNL The software blocked my order for a B12 and folate level Will follow for now

## 2013-11-09 NOTE — Assessment & Plan Note (Signed)
Her EKG shows  NSR RSR' and some artifact but no specific changes  Her labs show normal cardiac enzymes but her d-dimer is slightly elevated so I have ordered a CT angio to see if she has had a PE

## 2013-11-10 ENCOUNTER — Ambulatory Visit (INDEPENDENT_AMBULATORY_CARE_PROVIDER_SITE_OTHER)
Admission: RE | Admit: 2013-11-10 | Discharge: 2013-11-10 | Disposition: A | Discharge status: Home / Self Care | Payer: Medicare Other | Source: Ambulatory Visit | Attending: Internal Medicine | Admitting: Internal Medicine

## 2013-11-10 ENCOUNTER — Encounter: Payer: Self-pay | Admitting: Internal Medicine

## 2013-11-10 DIAGNOSIS — R0989 Other specified symptoms and signs involving the circulatory and respiratory systems: Secondary | ICD-10-CM

## 2013-11-10 DIAGNOSIS — R7989 Other specified abnormal findings of blood chemistry: Secondary | ICD-10-CM

## 2013-11-10 DIAGNOSIS — R0609 Other forms of dyspnea: Secondary | ICD-10-CM | POA: Diagnosis not present

## 2013-11-10 DIAGNOSIS — R0600 Dyspnea, unspecified: Secondary | ICD-10-CM

## 2013-11-10 DIAGNOSIS — R791 Abnormal coagulation profile: Secondary | ICD-10-CM

## 2013-11-10 DIAGNOSIS — J984 Other disorders of lung: Secondary | ICD-10-CM | POA: Diagnosis not present

## 2013-11-10 DIAGNOSIS — J479 Bronchiectasis, uncomplicated: Secondary | ICD-10-CM | POA: Diagnosis not present

## 2013-11-10 MED ORDER — IOHEXOL 350 MG/ML SOLN
100.0000 mL | Freq: Once | INTRAVENOUS | Status: AC | PRN
  Administered 2013-11-10: 100 mL via INTRAVENOUS

## 2013-11-17 DIAGNOSIS — Z48815 Encounter for surgical aftercare following surgery on the digestive system: Secondary | ICD-10-CM | POA: Diagnosis not present

## 2013-11-17 DIAGNOSIS — K5732 Diverticulitis of large intestine without perforation or abscess without bleeding: Secondary | ICD-10-CM | POA: Diagnosis not present

## 2013-11-17 DIAGNOSIS — I10 Essential (primary) hypertension: Secondary | ICD-10-CM | POA: Diagnosis not present

## 2013-11-17 DIAGNOSIS — K630 Abscess of intestine: Secondary | ICD-10-CM | POA: Diagnosis not present

## 2013-11-17 DIAGNOSIS — F3289 Other specified depressive episodes: Secondary | ICD-10-CM | POA: Diagnosis not present

## 2013-11-17 DIAGNOSIS — Z433 Encounter for attention to colostomy: Secondary | ICD-10-CM | POA: Diagnosis not present

## 2013-11-17 DIAGNOSIS — F329 Major depressive disorder, single episode, unspecified: Secondary | ICD-10-CM | POA: Diagnosis not present

## 2013-11-22 DIAGNOSIS — Z433 Encounter for attention to colostomy: Secondary | ICD-10-CM | POA: Diagnosis not present

## 2013-11-22 DIAGNOSIS — I10 Essential (primary) hypertension: Secondary | ICD-10-CM | POA: Diagnosis not present

## 2013-11-22 DIAGNOSIS — F3289 Other specified depressive episodes: Secondary | ICD-10-CM | POA: Diagnosis not present

## 2013-11-22 DIAGNOSIS — Z48815 Encounter for surgical aftercare following surgery on the digestive system: Secondary | ICD-10-CM | POA: Diagnosis not present

## 2013-11-22 DIAGNOSIS — K630 Abscess of intestine: Secondary | ICD-10-CM | POA: Diagnosis not present

## 2013-11-22 DIAGNOSIS — F329 Major depressive disorder, single episode, unspecified: Secondary | ICD-10-CM | POA: Diagnosis not present

## 2013-11-22 DIAGNOSIS — K5732 Diverticulitis of large intestine without perforation or abscess without bleeding: Secondary | ICD-10-CM | POA: Diagnosis not present

## 2013-11-29 ENCOUNTER — Ambulatory Visit (INDEPENDENT_AMBULATORY_CARE_PROVIDER_SITE_OTHER): Payer: Medicare Other | Admitting: Internal Medicine

## 2013-11-29 ENCOUNTER — Encounter: Payer: Self-pay | Admitting: Internal Medicine

## 2013-11-29 VITALS — BP 130/80 | HR 80 | Temp 97.0°F | Resp 16 | Ht 66.0 in | Wt 179.0 lb

## 2013-11-29 DIAGNOSIS — I10 Essential (primary) hypertension: Secondary | ICD-10-CM

## 2013-11-29 DIAGNOSIS — J438 Other emphysema: Secondary | ICD-10-CM

## 2013-11-29 DIAGNOSIS — J439 Emphysema, unspecified: Secondary | ICD-10-CM

## 2013-11-29 DIAGNOSIS — R9389 Abnormal findings on diagnostic imaging of other specified body structures: Secondary | ICD-10-CM

## 2013-11-29 DIAGNOSIS — R918 Other nonspecific abnormal finding of lung field: Secondary | ICD-10-CM

## 2013-11-29 NOTE — Progress Notes (Signed)
Pre visit review using our clinic review tool, if applicable. No additional management support is needed unless otherwise documented below in the visit note. 

## 2013-11-29 NOTE — Progress Notes (Signed)
   Subjective:    Patient ID: Valerie Jackson, female    DOB: 1945/09/17, 68 y.o.   MRN: [id number]  HPI Comments: She returns for f/up on SOB, her CT showed emphysema and some nodules that will require close monitor. She does not c/o cough.     Review of Systems  Constitutional: Negative.  Negative for fever, chills, diaphoresis, appetite change and fatigue.  HENT: Negative.   Eyes: Negative.   Respiratory: Positive for shortness of breath. Negative for apnea, cough, choking, chest tightness, wheezing and stridor.   Cardiovascular: Negative.  Negative for chest pain, palpitations and leg swelling.  Gastrointestinal: Negative.  Negative for nausea, vomiting, abdominal pain, diarrhea, constipation and blood in stool.  Endocrine: Negative.   Genitourinary: Negative.   Musculoskeletal: Negative.   Skin: Negative.   Allergic/Immunologic: Negative.   Neurological: Negative.   Hematological: Negative.  Negative for adenopathy. Does not bruise/bleed easily.  Psychiatric/Behavioral: Negative.        Objective:   Physical Exam  Vitals reviewed. Constitutional: She is oriented to person, place, and time. She appears well-developed and well-nourished. No distress.  HENT:  Head: Normocephalic and atraumatic.  Mouth/Throat: Oropharynx is clear and moist. No oropharyngeal exudate.  Eyes: Conjunctivae are normal. Right eye exhibits no discharge. Left eye exhibits no discharge. No scleral icterus.  Neck: Normal range of motion. Neck supple. No JVD present. No tracheal deviation present. No thyromegaly present.  Cardiovascular: Normal rate, regular rhythm, normal heart sounds and intact distal pulses.  Exam reveals no gallop and no friction rub.   No murmur heard. Pulmonary/Chest: Effort normal and breath sounds normal. No stridor. No respiratory distress. She has no wheezes. She has no rales. She exhibits no tenderness.  Abdominal: Soft. Bowel sounds are normal. She exhibits no distension and  no mass. There is no tenderness. There is no rebound and no guarding.  Musculoskeletal: Normal range of motion. She exhibits no edema and no tenderness.  Lymphadenopathy:    She has no cervical adenopathy.  Neurological: She is oriented to person, place, and time.  Skin: Skin is warm and dry. No rash noted. She is not diaphoretic. No erythema. No pallor.  Psychiatric: She has a normal mood and affect. Her behavior is normal. Judgment and thought content normal.     Lab Results  Component Value Date   WBC 8.1 11/08/2013   HGB 11.1* 11/08/2013   HCT 33.9* 11/08/2013   PLT 244.0 11/08/2013   GLUCOSE 112* 10/16/2013   CHOL 130 07/19/2013   TRIG 135.0 07/19/2013   HDL 53.20 07/19/2013   LDLDIRECT 226.0 02/27/2013   LDLCALC 50 07/19/2013   ALT 25 10/09/2013   AST 23 10/09/2013   NA 141 10/16/2013   K 3.8 10/16/2013   CL 102 10/16/2013   CREATININE 1.00 10/16/2013   BUN 10 10/16/2013   CO2 25 10/16/2013   TSH 0.36 11/08/2013   INR 1.15 09/18/2013   HGBA1C 4.7 02/27/2013       Assessment & Plan:

## 2013-11-29 NOTE — Patient Instructions (Signed)
Chronic Obstructive Pulmonary Disease  Chronic obstructive pulmonary disease (COPD) is a common lung condition in which airflow from the lungs is limited. COPD is a general term that can be used to describe many different lung problems that limit airflow, including both chronic bronchitis and emphysema.  If you have COPD, your lung function will probably never return to normal, but there are measures you can take to improve lung function and make yourself feel better.   CAUSES   · Smoking (common).    · Exposure to secondhand smoke.    · Genetic problems.  · Chronic inflammatory lung diseases or recurrent infections.  SYMPTOMS   · Shortness of breath, especially with physical activity.    · Deep, persistent (chronic) cough with a large amount of thick mucus.    · Wheezing.    · Rapid breaths (tachypnea).    · Gray or bluish discoloration (cyanosis) of the skin, especially in fingers, toes, or lips.    · Fatigue.    · Weight loss.    · Frequent infections or episodes when breathing symptoms become much worse (exacerbations).    · Chest tightness.  DIAGNOSIS   Your healthcare provider will take a medical history and perform a physical examination to make the initial diagnosis.  Additional tests for COPD may include:   · Lung (pulmonary) function tests.  · Chest X-ray.  · CT scan.  · Blood tests.  TREATMENT   Treatment available to help you feel better when you have COPD include:   · Inhaler and nebulizer medicines. These help manage the symptoms of COPD and make your breathing more comfortable  · Supplemental oxygen. Supplemental oxygen is only helpful if you have a low oxygen level in your blood.    · Exercise and physical activity. These are beneficial for nearly all people with COPD. Some people may also benefit from a pulmonary rehabilitation program.  HOME CARE INSTRUCTIONS   · Take all medicines (inhaled or pills) as directed by your health care provider.  · Only take over-the-counter or prescription medicines  for pain, fever, or discomfort as directed by your health care provider.    · Avoid over-the-counter medicines or cough syrups that dry up your airway (such as antihistamines) and slow down the elimination of secretions unless instructed otherwise by your healthcare provider.    · If you are a smoker, the most important thing that you can do is stop smoking. Continuing to smoke will cause further lung damage and breathing trouble. Ask your health care provider for help with quitting smoking. He or she can direct you to community resources or hospitals that provide support.  · Avoid exposure to irritants such as smoke, chemicals, and fumes that aggravate your breathing.  · Use oxygen therapy and pulmonary rehabilitation if directed by your health care provider. If you require home oxygen therapy, ask your healthcare provider whether you should purchase a pulse oximeter to measure your oxygen level at home.    · Avoid contact with individuals who have a contagious illness.  · Avoid extreme temperature and humidity changes.  · Eat healthy foods. Eating smaller, more frequent meals and resting before meals may help you maintain your strength.  · Stay active, but balance activity with periods of rest. Exercise and physical activity will help you maintain your ability to do things you want to do.  · Preventing infection and hospitalization is very important when you have COPD. Make sure to receive all the vaccines your health care provider recommends, especially the pneumococcal and influenza vaccines. Ask your healthcare provider whether you   need a pneumonia vaccine.  · Learn and use relaxation techniques to manage stress.  · Learn and use controlled breathing techniques as directed by your health care provider. Controlled breathing techniques include:    · Pursed lip breathing. Start by breathing in (inhaling) through your nose for 1 second. Then, purse your lips as if you were going to whistle and breathe out (exhale)  through the pursed lips for 2 seconds.    · Diaphragmatic breathing. Start by putting one hand on your abdomen just above your waist. Inhale slowly through your nose. The hand on your abdomen should move out. Then purse your lips and exhale slowly. You should be able to feel the hand on your abdomen moving in as you exhale.    · Learn and use controlled coughing to clear mucus from your lungs. Controlled coughing is a series of short, progressive coughs. The steps of controlled coughing are:    1. Lean your head slightly forward.    2. Breathe in deeply using diaphragmatic breathing.    3. Try to hold your breath for 3 seconds.    4. Keep your mouth slightly open while coughing twice.    5. Spit any mucus out into a tissue.    6. Rest and repeat the steps once or twice as needed.  SEEK MEDICAL CARE IF:   · You are coughing up more mucus than usual.    · There is a change in the color or thickness of your mucus.    · Your breathing is more labored than usual.    · Your breathing is faster than usual.    SEEK IMMEDIATE MEDICAL CARE IF:   · You have shortness of breath while you are resting.    · You have shortness of breath that prevents you from:  · Being able to talk.    · Performing your usual physical activities.    · You have chest pain lasting longer than 5 minutes.    · Your skin color is more cyanotic than usual.  · You measure low oxygen saturations for longer than 5 minutes with a pulse oximeter.  MAKE SURE YOU:   · Understand these instructions.  · Will watch your condition.  · Will get help right away if you are not doing well or get worse.  Document Released: 06/10/2005 Document Revised: 06/21/2013 Document Reviewed: 04/27/2013  [employer]® Patient Information ©2014 [employer].

## 2013-11-30 ENCOUNTER — Telehealth: Payer: Self-pay | Admitting: Internal Medicine

## 2013-11-30 ENCOUNTER — Telehealth: Payer: Self-pay

## 2013-11-30 ENCOUNTER — Encounter: Payer: Self-pay | Admitting: Internal Medicine

## 2013-11-30 DIAGNOSIS — Z48815 Encounter for surgical aftercare following surgery on the digestive system: Secondary | ICD-10-CM | POA: Diagnosis not present

## 2013-11-30 DIAGNOSIS — F329 Major depressive disorder, single episode, unspecified: Secondary | ICD-10-CM | POA: Diagnosis not present

## 2013-11-30 DIAGNOSIS — Z433 Encounter for attention to colostomy: Secondary | ICD-10-CM | POA: Diagnosis not present

## 2013-11-30 DIAGNOSIS — K5732 Diverticulitis of large intestine without perforation or abscess without bleeding: Secondary | ICD-10-CM | POA: Diagnosis not present

## 2013-11-30 DIAGNOSIS — I10 Essential (primary) hypertension: Secondary | ICD-10-CM | POA: Diagnosis not present

## 2013-11-30 DIAGNOSIS — F3289 Other specified depressive episodes: Secondary | ICD-10-CM | POA: Diagnosis not present

## 2013-11-30 DIAGNOSIS — K630 Abscess of intestine: Secondary | ICD-10-CM | POA: Diagnosis not present

## 2013-11-30 NOTE — Telephone Encounter (Signed)
Pt's rn from [facility] (heather) called and reported that pt's bp manual-170/110 hr-63, and with electronic cuff-169/101 hr-66.  Pt does not c/o any sx and has no swelling

## 2013-11-30 NOTE — Telephone Encounter (Signed)
Relevant patient education assigned to patient using Emmi. ° °

## 2013-11-30 NOTE — Assessment & Plan Note (Signed)
Her only complaint is SOB, she has quit smoking She does not want to start treating this with an inhaler Will recheck the CT scan in 3 months

## 2013-11-30 NOTE — Telephone Encounter (Signed)
Recheck the BP today and tomorrow, if it remains elevated then will need to start meds

## 2013-11-30 NOTE — Assessment & Plan Note (Signed)
Her BP is well controlled 

## 2013-12-01 NOTE — Telephone Encounter (Signed)
Pt.notified

## 2013-12-04 ENCOUNTER — Telehealth: Payer: Self-pay | Admitting: Internal Medicine

## 2013-12-04 ENCOUNTER — Other Ambulatory Visit: Payer: Self-pay | Admitting: Internal Medicine

## 2013-12-04 NOTE — Telephone Encounter (Signed)
Patient is calling to report her BP over the last few days. Please advise.   Friday 12/01/13: 150 over 106  Sunday 12/03/13: 162 over 99  Today 12/04/13: 167 over 100   87-95 pulse range

## 2013-12-04 NOTE — Telephone Encounter (Signed)
Elevated BP. Start Losartan 1 a day; NAS diet. F/u w/Dr Ronnald Ramp in 2 wks Thx

## 2013-12-04 NOTE — Telephone Encounter (Signed)
Yes, it does, but in a good way. Pls sch f/u appt w/DrJones to discuss Thx

## 2013-12-04 NOTE — Telephone Encounter (Signed)
Pt notified but would like detail information/specifics as to how this medication will lower her BP. Does it act on her kidneys etc?

## 2013-12-05 ENCOUNTER — Ambulatory Visit (INDEPENDENT_AMBULATORY_CARE_PROVIDER_SITE_OTHER): Payer: Medicare Other | Admitting: General Surgery

## 2013-12-05 ENCOUNTER — Encounter (INDEPENDENT_AMBULATORY_CARE_PROVIDER_SITE_OTHER): Payer: Self-pay | Admitting: General Surgery

## 2013-12-05 VITALS — BP 156/110 | HR 78 | Temp 97.6°F | Resp 16 | Ht 66.0 in | Wt 180.8 lb

## 2013-12-05 DIAGNOSIS — B372 Candidiasis of skin and nail: Secondary | ICD-10-CM

## 2013-12-05 DIAGNOSIS — K5720 Diverticulitis of large intestine with perforation and abscess without bleeding: Secondary | ICD-10-CM

## 2013-12-05 DIAGNOSIS — K5732 Diverticulitis of large intestine without perforation or abscess without bleeding: Secondary | ICD-10-CM

## 2013-12-05 MED ORDER — NYSTATIN 100000 UNIT/GM EX POWD: CUTANEOUS | Status: DC

## 2013-12-05 NOTE — Progress Notes (Signed)
HISTORY: Patient is around 2 months status post Hartmann's procedure for severe diverticulitis. She is complaining of some pain and breakdown around her ostomy. She is not having fevers and chills. She has had some increased blood pressure recently. She has had issues with dizziness when she lays down. Her midline incision is nearly totally healed.    EXAM: General:  Alert and oriented.   Incision:  No drainage.  Doing well.  candidiasis around ostomy    ASSESSMENT AND PLAN:   Candidiasis of skin Nystatin powder around ostomy  Diverticulitis of colon with perforation Pt continues to heal well.    Will follow up in 2 months.  Will set up surgery/colonoscopy at that time.        Milus Height, MD Surgical Oncology, [facility]  [name], MD Janith Lima, MD

## 2013-12-05 NOTE — Assessment & Plan Note (Signed)
Nystatin powder around ostomy

## 2013-12-05 NOTE — Assessment & Plan Note (Signed)
Pt continues to heal well.    Will follow up in 2 months.  Will set up surgery/colonoscopy at that time.

## 2013-12-05 NOTE — Patient Instructions (Signed)
Apply nystatin powder around stoma when changing bag.

## 2013-12-06 NOTE — Telephone Encounter (Signed)
appt with Plot 12/07/13.

## 2013-12-06 NOTE — Telephone Encounter (Signed)
Left vm for pt to callback 

## 2013-12-06 NOTE — Telephone Encounter (Signed)
Patient called stating that BP continues to rise. Need to schedule appointment to discuss concerns with MD.

## 2013-12-07 ENCOUNTER — Encounter: Payer: Self-pay | Admitting: Internal Medicine

## 2013-12-07 ENCOUNTER — Ambulatory Visit (INDEPENDENT_AMBULATORY_CARE_PROVIDER_SITE_OTHER): Payer: Medicare Other | Admitting: Internal Medicine

## 2013-12-07 VITALS — BP 160/108 | HR 76 | Temp 98.4°F | Resp 16 | Wt 182.0 lb

## 2013-12-07 DIAGNOSIS — K630 Abscess of intestine: Secondary | ICD-10-CM | POA: Diagnosis not present

## 2013-12-07 DIAGNOSIS — F329 Major depressive disorder, single episode, unspecified: Secondary | ICD-10-CM | POA: Diagnosis not present

## 2013-12-07 DIAGNOSIS — I10 Essential (primary) hypertension: Secondary | ICD-10-CM | POA: Diagnosis not present

## 2013-12-07 DIAGNOSIS — F3289 Other specified depressive episodes: Secondary | ICD-10-CM | POA: Diagnosis not present

## 2013-12-07 DIAGNOSIS — R42 Dizziness and giddiness: Secondary | ICD-10-CM | POA: Diagnosis not present

## 2013-12-07 DIAGNOSIS — K5732 Diverticulitis of large intestine without perforation or abscess without bleeding: Secondary | ICD-10-CM | POA: Diagnosis not present

## 2013-12-07 DIAGNOSIS — Z433 Encounter for attention to colostomy: Secondary | ICD-10-CM | POA: Diagnosis not present

## 2013-12-07 DIAGNOSIS — Z48815 Encounter for surgical aftercare following surgery on the digestive system: Secondary | ICD-10-CM | POA: Diagnosis not present

## 2013-12-07 MED ORDER — VITAMIN D 1000 UNITS PO TABS: 1000.0000 [IU] | ORAL_TABLET | Freq: Every day | ORAL | Status: AC

## 2013-12-07 MED ORDER — MECLIZINE HCL 12.5 MG PO TABS: 12.5000 mg | ORAL_TABLET | Freq: Three times a day (TID) | ORAL | Status: DC | PRN

## 2013-12-07 MED ORDER — HYDROCHLOROTHIAZIDE 12.5 MG PO CAPS: 12.5000 mg | ORAL_CAPSULE | Freq: Every day | ORAL | Status: DC

## 2013-12-07 NOTE — Patient Instructions (Signed)
Benign Positional Vertigo symptoms Start Meclizine. Start Laruth Bouchard - Daroff exercise several times a day as dirrected.

## 2013-12-07 NOTE — Progress Notes (Signed)
   Subjective:    Patient ID: Valerie Jackson, female    DOB: 13-Oct-1945, 68 y.o.   MRN: [id number]  HPI  C/o HTN. H/o HTN 5 years ago - took Avalide - it was too stong...s/p colostomy placement post-resection of an abscess No BP is 160/100  BP Readings from Last 3 Encounters:  12/07/13 160/108  12/05/13 156/110  11/29/13 130/80   Wt Readings from Last 3 Encounters:  12/07/13 182 lb (82.555 kg)  12/05/13 180 lb 12.8 oz (82.01 kg)  11/29/13 179 lb (81.194 kg)      Review of Systems  Constitutional: Negative for chills, activity change, appetite change, fatigue and unexpected weight change.  HENT: Negative for congestion, mouth sores and sinus pressure.   Eyes: Negative for visual disturbance.  Respiratory: Negative for cough and chest tightness.   Gastrointestinal: Negative for nausea and abdominal pain.  Genitourinary: Negative for frequency, difficulty urinating and vaginal pain.  Musculoskeletal: Negative for back pain and gait problem.  Skin: Negative for pallor and rash.  Neurological: Negative for dizziness, tremors, weakness, numbness and headaches.  Psychiatric/Behavioral: Negative for confusion and sleep disturbance.       Objective:   Physical Exam  Constitutional: She appears well-developed. No distress.  HENT:  Head: Normocephalic.  Right Ear: External ear normal.  Left Ear: External ear normal.  Nose: Nose normal.  Mouth/Throat: Oropharynx is clear and moist.  Eyes: Conjunctivae are normal. Pupils are equal, round, and reactive to light. Right eye exhibits no discharge. Left eye exhibits no discharge.  Neck: Normal range of motion. Neck supple. No JVD present. No tracheal deviation present. No thyromegaly present.  Cardiovascular: Normal rate, regular rhythm and normal heart sounds.   Pulmonary/Chest: No stridor. No respiratory distress. She has no wheezes.  Abdominal: Soft. Bowel sounds are normal. She exhibits no distension and no mass. There is no  tenderness. There is no rebound and no guarding.  Musculoskeletal: She exhibits no edema and no tenderness.  Lymphadenopathy:    She has no cervical adenopathy.  Neurological: She displays normal reflexes. No cranial nerve deficit. She exhibits normal muscle tone. Coordination normal.  Skin: No rash noted. No erythema.  Psychiatric: She has a normal mood and affect. Her behavior is normal. Judgment and thought content normal.   colostomy       Assessment & Plan:

## 2013-12-07 NOTE — Assessment & Plan Note (Signed)
BPV

## 2013-12-07 NOTE — Assessment & Plan Note (Signed)
Start HCTZ Lab NAS diet Start walking

## 2013-12-07 NOTE — Progress Notes (Signed)
Pre visit review using our clinic review tool, if applicable. No additional management support is needed unless otherwise documented below in the visit note. 

## 2013-12-08 ENCOUNTER — Telehealth: Payer: Self-pay | Admitting: Internal Medicine

## 2013-12-08 NOTE — Telephone Encounter (Signed)
Relevant patient education assigned to patient using Emmi. ° °

## 2013-12-11 ENCOUNTER — Telehealth (INDEPENDENT_AMBULATORY_CARE_PROVIDER_SITE_OTHER): Payer: Self-pay

## 2013-12-11 NOTE — Telephone Encounter (Signed)
Requesting an extension on home health for 3 more weeks. The pt does have a f/u appt with Dr Barry Dienes on 01/30/14. Please advise.

## 2013-12-12 NOTE — Telephone Encounter (Signed)
LMOV.  Ok to extend home health visits x 3 weeks.  Dr. Barry Dienes will be made aware.

## 2013-12-13 ENCOUNTER — Telehealth (INDEPENDENT_AMBULATORY_CARE_PROVIDER_SITE_OTHER): Payer: Self-pay

## 2013-12-13 DIAGNOSIS — K5732 Diverticulitis of large intestine without perforation or abscess without bleeding: Secondary | ICD-10-CM | POA: Diagnosis not present

## 2013-12-13 DIAGNOSIS — Z433 Encounter for attention to colostomy: Secondary | ICD-10-CM | POA: Diagnosis not present

## 2013-12-13 DIAGNOSIS — Z48815 Encounter for surgical aftercare following surgery on the digestive system: Secondary | ICD-10-CM | POA: Diagnosis not present

## 2013-12-13 DIAGNOSIS — F329 Major depressive disorder, single episode, unspecified: Secondary | ICD-10-CM | POA: Diagnosis not present

## 2013-12-13 DIAGNOSIS — K630 Abscess of intestine: Secondary | ICD-10-CM | POA: Diagnosis not present

## 2013-12-13 DIAGNOSIS — I10 Essential (primary) hypertension: Secondary | ICD-10-CM | POA: Diagnosis not present

## 2013-12-13 DIAGNOSIS — F3289 Other specified depressive episodes: Secondary | ICD-10-CM | POA: Diagnosis not present

## 2013-12-13 NOTE — Telephone Encounter (Signed)
No notes in encounter.

## 2013-12-13 NOTE — Telephone Encounter (Signed)
Will contact [facility] on 12/18/13 with VO from Dr. Barry Dienes to transfer pt's care to Dr. Janith Lima with [place].

## 2013-12-13 NOTE — Telephone Encounter (Signed)
Heather with [facility] states patient has other issues that needs to be followed , COPD, HTN, she is asking could the patient be  transitioned to DR. Jones to be followed and to have further orders to be  Sent to [facility] . Heathers # [phone]

## 2013-12-17 DIAGNOSIS — K630 Abscess of intestine: Secondary | ICD-10-CM | POA: Diagnosis not present

## 2013-12-17 DIAGNOSIS — R5381 Other malaise: Secondary | ICD-10-CM | POA: Diagnosis not present

## 2013-12-17 DIAGNOSIS — I10 Essential (primary) hypertension: Secondary | ICD-10-CM | POA: Diagnosis not present

## 2013-12-17 DIAGNOSIS — Z433 Encounter for attention to colostomy: Secondary | ICD-10-CM | POA: Diagnosis not present

## 2013-12-17 DIAGNOSIS — Z48815 Encounter for surgical aftercare following surgery on the digestive system: Secondary | ICD-10-CM | POA: Diagnosis not present

## 2013-12-17 DIAGNOSIS — E669 Obesity, unspecified: Secondary | ICD-10-CM | POA: Diagnosis not present

## 2013-12-17 DIAGNOSIS — F3289 Other specified depressive episodes: Secondary | ICD-10-CM | POA: Diagnosis not present

## 2013-12-17 DIAGNOSIS — K5732 Diverticulitis of large intestine without perforation or abscess without bleeding: Secondary | ICD-10-CM | POA: Diagnosis not present

## 2013-12-17 DIAGNOSIS — F329 Major depressive disorder, single episode, unspecified: Secondary | ICD-10-CM | POA: Diagnosis not present

## 2013-12-18 NOTE — Telephone Encounter (Signed)
LMOV verbal order from Dr. Barry Dienes to change pt care to Dr. [name] with [place].

## 2013-12-18 NOTE — Telephone Encounter (Signed)
LMOM with Heather- [facility] giving verbal order on this pt ok to transfer pt's care to Dr Janith Lima with [facility] per Dr Barry Dienes.

## 2013-12-20 DIAGNOSIS — Z433 Encounter for attention to colostomy: Secondary | ICD-10-CM | POA: Diagnosis not present

## 2013-12-20 DIAGNOSIS — Z48815 Encounter for surgical aftercare following surgery on the digestive system: Secondary | ICD-10-CM | POA: Diagnosis not present

## 2013-12-20 DIAGNOSIS — K5732 Diverticulitis of large intestine without perforation or abscess without bleeding: Secondary | ICD-10-CM | POA: Diagnosis not present

## 2013-12-20 DIAGNOSIS — F329 Major depressive disorder, single episode, unspecified: Secondary | ICD-10-CM | POA: Diagnosis not present

## 2013-12-20 DIAGNOSIS — K630 Abscess of intestine: Secondary | ICD-10-CM | POA: Diagnosis not present

## 2013-12-20 DIAGNOSIS — F3289 Other specified depressive episodes: Secondary | ICD-10-CM | POA: Diagnosis not present

## 2013-12-20 DIAGNOSIS — I10 Essential (primary) hypertension: Secondary | ICD-10-CM | POA: Diagnosis not present

## 2013-12-25 ENCOUNTER — Encounter: Payer: Self-pay | Admitting: Internal Medicine

## 2013-12-26 DIAGNOSIS — F329 Major depressive disorder, single episode, unspecified: Secondary | ICD-10-CM | POA: Diagnosis not present

## 2013-12-26 DIAGNOSIS — Z433 Encounter for attention to colostomy: Secondary | ICD-10-CM | POA: Diagnosis not present

## 2013-12-26 DIAGNOSIS — I10 Essential (primary) hypertension: Secondary | ICD-10-CM | POA: Diagnosis not present

## 2013-12-26 DIAGNOSIS — F3289 Other specified depressive episodes: Secondary | ICD-10-CM | POA: Diagnosis not present

## 2013-12-26 DIAGNOSIS — Z48815 Encounter for surgical aftercare following surgery on the digestive system: Secondary | ICD-10-CM | POA: Diagnosis not present

## 2013-12-26 DIAGNOSIS — K5732 Diverticulitis of large intestine without perforation or abscess without bleeding: Secondary | ICD-10-CM | POA: Diagnosis not present

## 2013-12-26 DIAGNOSIS — K630 Abscess of intestine: Secondary | ICD-10-CM | POA: Diagnosis not present

## 2013-12-28 DIAGNOSIS — I10 Essential (primary) hypertension: Secondary | ICD-10-CM | POA: Diagnosis not present

## 2013-12-28 DIAGNOSIS — K630 Abscess of intestine: Secondary | ICD-10-CM | POA: Diagnosis not present

## 2013-12-28 DIAGNOSIS — Z433 Encounter for attention to colostomy: Secondary | ICD-10-CM | POA: Diagnosis not present

## 2013-12-28 DIAGNOSIS — K5732 Diverticulitis of large intestine without perforation or abscess without bleeding: Secondary | ICD-10-CM | POA: Diagnosis not present

## 2013-12-28 DIAGNOSIS — Z48815 Encounter for surgical aftercare following surgery on the digestive system: Secondary | ICD-10-CM | POA: Diagnosis not present

## 2013-12-28 DIAGNOSIS — F329 Major depressive disorder, single episode, unspecified: Secondary | ICD-10-CM | POA: Diagnosis not present

## 2013-12-28 DIAGNOSIS — F3289 Other specified depressive episodes: Secondary | ICD-10-CM | POA: Diagnosis not present

## 2013-12-31 ENCOUNTER — Encounter: Payer: Self-pay | Admitting: Internal Medicine

## 2014-01-02 DIAGNOSIS — I10 Essential (primary) hypertension: Secondary | ICD-10-CM | POA: Diagnosis not present

## 2014-01-02 DIAGNOSIS — K5732 Diverticulitis of large intestine without perforation or abscess without bleeding: Secondary | ICD-10-CM | POA: Diagnosis not present

## 2014-01-02 DIAGNOSIS — Z48815 Encounter for surgical aftercare following surgery on the digestive system: Secondary | ICD-10-CM | POA: Diagnosis not present

## 2014-01-02 DIAGNOSIS — Z433 Encounter for attention to colostomy: Secondary | ICD-10-CM | POA: Diagnosis not present

## 2014-01-02 DIAGNOSIS — K630 Abscess of intestine: Secondary | ICD-10-CM | POA: Diagnosis not present

## 2014-01-02 DIAGNOSIS — F3289 Other specified depressive episodes: Secondary | ICD-10-CM | POA: Diagnosis not present

## 2014-01-02 DIAGNOSIS — F329 Major depressive disorder, single episode, unspecified: Secondary | ICD-10-CM | POA: Diagnosis not present

## 2014-01-04 ENCOUNTER — Ambulatory Visit (INDEPENDENT_AMBULATORY_CARE_PROVIDER_SITE_OTHER): Payer: Medicare Other | Admitting: Internal Medicine

## 2014-01-04 ENCOUNTER — Ambulatory Visit (INDEPENDENT_AMBULATORY_CARE_PROVIDER_SITE_OTHER)
Admission: RE | Admit: 2014-01-04 | Discharge: 2014-01-04 | Disposition: A | Discharge status: Home / Self Care | Payer: Medicare Other | Source: Ambulatory Visit | Attending: Internal Medicine | Admitting: Internal Medicine

## 2014-01-04 ENCOUNTER — Encounter: Payer: Self-pay | Admitting: Internal Medicine

## 2014-01-04 VITALS — BP 122/82 | HR 85 | Temp 97.7°F | Resp 16 | Wt 183.0 lb

## 2014-01-04 DIAGNOSIS — I10 Essential (primary) hypertension: Secondary | ICD-10-CM

## 2014-01-04 DIAGNOSIS — R109 Unspecified abdominal pain: Secondary | ICD-10-CM | POA: Diagnosis not present

## 2014-01-04 DIAGNOSIS — R52 Pain, unspecified: Secondary | ICD-10-CM

## 2014-01-04 DIAGNOSIS — R1031 Right lower quadrant pain: Secondary | ICD-10-CM

## 2014-01-04 NOTE — Patient Instructions (Signed)
Abdominal Pain, Adult °Many things can cause abdominal pain. Usually, abdominal pain is not caused by a disease and will improve without treatment. It can often be observed and treated at home. Your health care provider will do a physical exam and possibly order blood tests and X-rays to help determine the seriousness of your pain. However, in many cases, more time must pass before a clear cause of the pain can be found. Before that point, your health care provider may not know if you need more testing or further treatment. °HOME CARE INSTRUCTIONS  °Monitor your abdominal pain for any changes. The following actions may help to alleviate any discomfort you are experiencing: °· Only take over-the-counter or prescription medicines as directed by your health care provider. °· Do not take laxatives unless directed to do so by your health care provider. °· Try a clear liquid diet (broth, tea, or water) as directed by your health care provider. Slowly move to a bland diet as tolerated. °SEEK MEDICAL CARE IF: °· You have unexplained abdominal pain. °· You have abdominal pain associated with nausea or diarrhea. °· You have pain when you urinate or have a bowel movement. °· You experience abdominal pain that wakes you in the night. °· You have abdominal pain that is worsened or improved by eating food. °· You have abdominal pain that is worsened with eating fatty foods. °SEEK IMMEDIATE MEDICAL CARE IF:  °· Your pain does not go away within 2 hours. °· You have a fever. °· You keep throwing up (vomiting). °· Your pain is felt only in portions of the abdomen, such as the right side or the left lower portion of the abdomen. °· You pass bloody or black tarry stools. °MAKE SURE YOU: °· Understand these instructions.   °· Will watch your condition.   °· Will get help right away if you are not doing well or get worse.   °Document Released: 06/10/2005 Document Revised: 06/21/2013 Document Reviewed: 05/10/2013 °[employer]® Patient  Information ©2014 [employer]. ° °

## 2014-01-04 NOTE — Assessment & Plan Note (Signed)
Her BP is well controlled today

## 2014-01-04 NOTE — Progress Notes (Signed)
Pre visit review using our clinic review tool, if applicable. No additional management support is needed unless otherwise documented below in the visit note. 

## 2014-01-04 NOTE — Assessment & Plan Note (Addendum)
Based on her symptoms and exam this appears to be benign - maybe IBS I will check a plain film to see if there is concern for SBO, ileus

## 2014-01-04 NOTE — Progress Notes (Signed)
Subjective:    Patient ID: Valerie Jackson, female    DOB: 09/25/1945, 68 y.o.   MRN: [id number]  Abdominal Pain This is a new problem. The current episode started today. The onset quality is gradual. The problem occurs intermittently. The problem has been unchanged. The pain is located in the RLQ. The pain is at a severity of 1/10. The pain is mild. The quality of the pain is aching (she feels bloated). The abdominal pain does not radiate. Pertinent negatives include no anorexia, arthralgias, belching, constipation, diarrhea, dysuria, fever, flatus, frequency, headaches, hematochezia, hematuria, melena, myalgias, nausea, vomiting or weight loss. Nothing aggravates the pain. The pain is relieved by nothing. She has tried nothing for the symptoms.      Review of Systems  Constitutional: Negative.  Negative for fever, chills, weight loss, diaphoresis, appetite change and fatigue.  HENT: Negative.   Eyes: Negative.   Respiratory: Negative.  Negative for cough, choking, chest tightness, shortness of breath and stridor.   Cardiovascular: Negative.  Negative for chest pain, palpitations and leg swelling.  Gastrointestinal: Positive for abdominal pain. Negative for nausea, vomiting, diarrhea, constipation, blood in stool, melena, hematochezia, abdominal distention, anal bleeding, rectal pain, anorexia and flatus.  Endocrine: Negative.   Genitourinary: Negative.  Negative for dysuria, frequency and hematuria.  Musculoskeletal: Negative.  Negative for arthralgias and myalgias.  Skin: Negative.   Allergic/Immunologic: Negative.   Neurological: Negative.  Negative for headaches.  Hematological: Negative.  Negative for adenopathy. Does not bruise/bleed easily.  Psychiatric/Behavioral: Negative.        Objective:   Physical Exam  Vitals reviewed. Constitutional: She is oriented to person, place, and time. She appears well-developed and well-nourished.  Non-toxic appearance. She does not have a  sickly appearance. She does not appear ill. No distress.  HENT:  Head: Normocephalic and atraumatic.  Mouth/Throat: Oropharynx is clear and moist. No oropharyngeal exudate.  Eyes: Conjunctivae are normal. Right eye exhibits no discharge. Left eye exhibits no discharge. No scleral icterus.  Neck: Normal range of motion. Neck supple. No JVD present. No tracheal deviation present. No thyromegaly present.  Cardiovascular: Normal rate, regular rhythm, normal heart sounds and intact distal pulses.  Exam reveals no gallop and no friction rub.   No murmur heard. Pulmonary/Chest: Effort normal and breath sounds normal. No stridor. No respiratory distress. She has no wheezes. She has no rales. She exhibits no tenderness.  Abdominal: Soft. Normal appearance and bowel sounds are normal. She exhibits no shifting dullness, no distension, no pulsatile liver, no fluid wave, no abdominal bruit, no ascites, no pulsatile midline mass and no mass. There is no hepatosplenomegaly, splenomegaly or hepatomegaly. There is tenderness in the right lower quadrant. There is no rebound, no guarding, no CVA tenderness, no tenderness at McBurney's point and negative Murphy's sign. No hernia. Hernia confirmed negative in the ventral area, confirmed negative in the right inguinal area and confirmed negative in the left inguinal area.  Musculoskeletal: Normal range of motion. She exhibits no edema and no tenderness.  Lymphadenopathy:    She has no cervical adenopathy.  Neurological: She is oriented to person, place, and time.  Skin: Skin is warm and dry. No rash noted. She is not diaphoretic. No erythema. No pallor.  Psychiatric: She has a normal mood and affect. Her behavior is normal. Judgment and thought content normal.     Lab Results  Component Value Date   WBC 8.1 11/08/2013   HGB 11.1* 11/08/2013   HCT 33.9* 11/08/2013  PLT 244.0 11/08/2013   GLUCOSE 112* 10/16/2013   CHOL 130 07/19/2013   TRIG 135.0 07/19/2013   HDL 53.20  07/19/2013   LDLDIRECT 226.0 02/27/2013   LDLCALC 50 07/19/2013   ALT 25 10/09/2013   AST 23 10/09/2013   NA 141 10/16/2013   K 3.8 10/16/2013   CL 102 10/16/2013   CREATININE 1.00 10/16/2013   BUN 10 10/16/2013   CO2 25 10/16/2013   TSH 0.36 11/08/2013   INR 1.15 09/18/2013   HGBA1C 4.7 02/27/2013       Assessment & Plan:

## 2014-01-09 DIAGNOSIS — K630 Abscess of intestine: Secondary | ICD-10-CM | POA: Diagnosis not present

## 2014-01-09 DIAGNOSIS — K5732 Diverticulitis of large intestine without perforation or abscess without bleeding: Secondary | ICD-10-CM | POA: Diagnosis not present

## 2014-01-09 DIAGNOSIS — Z48815 Encounter for surgical aftercare following surgery on the digestive system: Secondary | ICD-10-CM | POA: Diagnosis not present

## 2014-01-09 DIAGNOSIS — Z433 Encounter for attention to colostomy: Secondary | ICD-10-CM | POA: Diagnosis not present

## 2014-01-09 DIAGNOSIS — F329 Major depressive disorder, single episode, unspecified: Secondary | ICD-10-CM | POA: Diagnosis not present

## 2014-01-09 DIAGNOSIS — F3289 Other specified depressive episodes: Secondary | ICD-10-CM | POA: Diagnosis not present

## 2014-01-09 DIAGNOSIS — I10 Essential (primary) hypertension: Secondary | ICD-10-CM | POA: Diagnosis not present

## 2014-01-10 ENCOUNTER — Encounter: Payer: Self-pay | Admitting: Internal Medicine

## 2014-01-10 ENCOUNTER — Ambulatory Visit (INDEPENDENT_AMBULATORY_CARE_PROVIDER_SITE_OTHER): Payer: Medicare Other | Admitting: Internal Medicine

## 2014-01-10 VITALS — BP 118/82 | HR 71 | Temp 97.0°F | Resp 16 | Ht 66.0 in | Wt 185.0 lb

## 2014-01-10 DIAGNOSIS — E039 Hypothyroidism, unspecified: Secondary | ICD-10-CM

## 2014-01-10 DIAGNOSIS — I10 Essential (primary) hypertension: Secondary | ICD-10-CM

## 2014-01-10 DIAGNOSIS — J439 Emphysema, unspecified: Secondary | ICD-10-CM

## 2014-01-10 DIAGNOSIS — J438 Other emphysema: Secondary | ICD-10-CM

## 2014-01-10 MED ORDER — TIOTROPIUM BROMIDE MONOHYDRATE 2.5 MCG/ACT IN AERS: 2.0000 | INHALATION_SPRAY | Freq: Every day | RESPIRATORY_TRACT | Status: DC

## 2014-01-10 NOTE — Assessment & Plan Note (Signed)
Her TSH is in the normal range

## 2014-01-10 NOTE — Progress Notes (Signed)
Pre visit review using our clinic review tool, if applicable. No additional management support is needed unless otherwise documented below in the visit note. 

## 2014-01-10 NOTE — Patient Instructions (Signed)
Chronic Obstructive Pulmonary Disease  Chronic obstructive pulmonary disease (COPD) is a common lung condition in which airflow from the lungs is limited. COPD is a general term that can be used to describe many different lung problems that limit airflow, including both chronic bronchitis and emphysema.  If you have COPD, your lung function will probably never return to normal, but there are measures you can take to improve lung function and make yourself feel better.   CAUSES   · Smoking (common).    · Exposure to secondhand smoke.    · Genetic problems.  · Chronic inflammatory lung diseases or recurrent infections.  SYMPTOMS   · Shortness of breath, especially with physical activity.    · Deep, persistent (chronic) cough with a large amount of thick mucus.    · Wheezing.    · Rapid breaths (tachypnea).    · Gray or bluish discoloration (cyanosis) of the skin, especially in fingers, toes, or lips.    · Fatigue.    · Weight loss.    · Frequent infections or episodes when breathing symptoms become much worse (exacerbations).    · Chest tightness.  DIAGNOSIS   Your healthcare provider will take a medical history and perform a physical examination to make the initial diagnosis.  Additional tests for COPD may include:   · Lung (pulmonary) function tests.  · Chest X-ray.  · CT scan.  · Blood tests.  TREATMENT   Treatment available to help you feel better when you have COPD include:   · Inhaler and nebulizer medicines. These help manage the symptoms of COPD and make your breathing more comfortable  · Supplemental oxygen. Supplemental oxygen is only helpful if you have a low oxygen level in your blood.    · Exercise and physical activity. These are beneficial for nearly all people with COPD. Some people may also benefit from a pulmonary rehabilitation program.  HOME CARE INSTRUCTIONS   · Take all medicines (inhaled or pills) as directed by your health care provider.  · Only take over-the-counter or prescription medicines  for pain, fever, or discomfort as directed by your health care provider.    · Avoid over-the-counter medicines or cough syrups that dry up your airway (such as antihistamines) and slow down the elimination of secretions unless instructed otherwise by your healthcare provider.    · If you are a smoker, the most important thing that you can do is stop smoking. Continuing to smoke will cause further lung damage and breathing trouble. Ask your health care provider for help with quitting smoking. He or she can direct you to community resources or hospitals that provide support.  · Avoid exposure to irritants such as smoke, chemicals, and fumes that aggravate your breathing.  · Use oxygen therapy and pulmonary rehabilitation if directed by your health care provider. If you require home oxygen therapy, ask your healthcare provider whether you should purchase a pulse oximeter to measure your oxygen level at home.    · Avoid contact with individuals who have a contagious illness.  · Avoid extreme temperature and humidity changes.  · Eat healthy foods. Eating smaller, more frequent meals and resting before meals may help you maintain your strength.  · Stay active, but balance activity with periods of rest. Exercise and physical activity will help you maintain your ability to do things you want to do.  · Preventing infection and hospitalization is very important when you have COPD. Make sure to receive all the vaccines your health care provider recommends, especially the pneumococcal and influenza vaccines. Ask your healthcare provider whether you   need a pneumonia vaccine.  · Learn and use relaxation techniques to manage stress.  · Learn and use controlled breathing techniques as directed by your health care provider. Controlled breathing techniques include:    · Pursed lip breathing. Start by breathing in (inhaling) through your nose for 1 second. Then, purse your lips as if you were going to whistle and breathe out (exhale)  through the pursed lips for 2 seconds.    · Diaphragmatic breathing. Start by putting one hand on your abdomen just above your waist. Inhale slowly through your nose. The hand on your abdomen should move out. Then purse your lips and exhale slowly. You should be able to feel the hand on your abdomen moving in as you exhale.    · Learn and use controlled coughing to clear mucus from your lungs. Controlled coughing is a series of short, progressive coughs. The steps of controlled coughing are:    1. Lean your head slightly forward.    2. Breathe in deeply using diaphragmatic breathing.    3. Try to hold your breath for 3 seconds.    4. Keep your mouth slightly open while coughing twice.    5. Spit any mucus out into a tissue.    6. Rest and repeat the steps once or twice as needed.  SEEK MEDICAL CARE IF:   · You are coughing up more mucus than usual.    · There is a change in the color or thickness of your mucus.    · Your breathing is more labored than usual.    · Your breathing is faster than usual.    SEEK IMMEDIATE MEDICAL CARE IF:   · You have shortness of breath while you are resting.    · You have shortness of breath that prevents you from:  · Being able to talk.    · Performing your usual physical activities.    · You have chest pain lasting longer than 5 minutes.    · Your skin color is more cyanotic than usual.  · You measure low oxygen saturations for longer than 5 minutes with a pulse oximeter.  MAKE SURE YOU:   · Understand these instructions.  · Will watch your condition.  · Will get help right away if you are not doing well or get worse.  Document Released: 06/10/2005 Document Revised: 06/21/2013 Document Reviewed: 04/27/2013  [employer]® Patient Information ©2014 [employer].

## 2014-01-10 NOTE — Assessment & Plan Note (Addendum)
I don't think her SOB is related to the heart but I do think it is related to COPD I have asked her to start spiriva - I gave her samples and showed her how to use it and she demonstrated proficiency with its use

## 2014-01-10 NOTE — Progress Notes (Signed)
   Subjective:    Patient ID: Valerie Jackson, female    DOB: Jan 16, 1946, 68 y.o.   MRN: [id number]  HPI Comments: She feels SOB when talking and wants to try an inhaler.  Shortness of Breath This is a recurrent problem. The current episode started more than 1 month ago. The problem occurs intermittently. The problem has been unchanged. Pertinent negatives include no abdominal pain, chest pain, claudication, coryza, ear pain, fever, headaches, hemoptysis, leg pain, leg swelling, neck pain, orthopnea, PND, rash, sore throat, sputum production, swollen glands, syncope, vomiting or wheezing. Nothing aggravates the symptoms. She has tried nothing for the symptoms. The treatment provided no relief. Her past medical history is significant for COPD.      Review of Systems  Constitutional: Negative.  Negative for fever, chills, diaphoresis, appetite change and fatigue.  HENT: Negative.  Negative for ear pain and sore throat.   Eyes: Negative.   Respiratory: Positive for shortness of breath. Negative for apnea, cough, hemoptysis, sputum production, choking, chest tightness, wheezing and stridor.   Cardiovascular: Negative.  Negative for chest pain, orthopnea, claudication, leg swelling, syncope and PND.  Gastrointestinal: Negative.  Negative for nausea, vomiting, abdominal pain, diarrhea, constipation and blood in stool.  Endocrine: Negative.   Genitourinary: Negative.   Musculoskeletal: Negative.  Negative for neck pain.  Skin: Negative.  Negative for rash.  Allergic/Immunologic: Negative.   Neurological: Negative.  Negative for dizziness, tremors, syncope, weakness, light-headedness, numbness and headaches.  Hematological: Negative.  Negative for adenopathy. Does not bruise/bleed easily.  Psychiatric/Behavioral: Negative.        Objective:   Physical Exam  Vitals reviewed. Constitutional: She is oriented to person, place, and time. She appears well-developed and well-nourished. No distress.    HENT:  Head: Normocephalic and atraumatic.  Mouth/Throat: No oropharyngeal exudate.  Eyes: Conjunctivae are normal. Right eye exhibits no discharge. Left eye exhibits no discharge. No scleral icterus.  Neck: Normal range of motion. Neck supple. No JVD present. No tracheal deviation present. No thyromegaly present.  Cardiovascular: Normal rate, regular rhythm, normal heart sounds and intact distal pulses.  Exam reveals no gallop and no friction rub.   No murmur heard. Pulmonary/Chest: Effort normal and breath sounds normal. No stridor. No respiratory distress. She has no wheezes. She has no rales. She exhibits no tenderness.  Abdominal: Soft. Bowel sounds are normal. She exhibits no distension and no mass. There is no tenderness. There is no rebound and no guarding.  Musculoskeletal: Normal range of motion. She exhibits no edema and no tenderness.  Lymphadenopathy:    She has no cervical adenopathy.  Neurological: She is oriented to person, place, and time.  Skin: Skin is warm and dry. No rash noted. She is not diaphoretic. No erythema. No pallor.     Lab Results  Component Value Date   WBC 8.1 11/08/2013   HGB 11.1* 11/08/2013   HCT 33.9* 11/08/2013   PLT 244.0 11/08/2013   GLUCOSE 112* 10/16/2013   CHOL 130 07/19/2013   TRIG 135.0 07/19/2013   HDL 53.20 07/19/2013   LDLDIRECT 226.0 02/27/2013   LDLCALC 50 07/19/2013   ALT 25 10/09/2013   AST 23 10/09/2013   NA 141 10/16/2013   K 3.8 10/16/2013   CL 102 10/16/2013   CREATININE 1.00 10/16/2013   BUN 10 10/16/2013   CO2 25 10/16/2013   TSH 0.36 11/08/2013   INR 1.15 09/18/2013   HGBA1C 4.7 02/27/2013       Assessment & Plan:

## 2014-01-10 NOTE — Assessment & Plan Note (Signed)
Her BP is well controlled 

## 2014-01-15 DIAGNOSIS — K630 Abscess of intestine: Secondary | ICD-10-CM | POA: Diagnosis not present

## 2014-01-15 DIAGNOSIS — Z433 Encounter for attention to colostomy: Secondary | ICD-10-CM | POA: Diagnosis not present

## 2014-01-15 DIAGNOSIS — K5732 Diverticulitis of large intestine without perforation or abscess without bleeding: Secondary | ICD-10-CM | POA: Diagnosis not present

## 2014-01-15 DIAGNOSIS — I10 Essential (primary) hypertension: Secondary | ICD-10-CM | POA: Diagnosis not present

## 2014-01-15 DIAGNOSIS — Z48815 Encounter for surgical aftercare following surgery on the digestive system: Secondary | ICD-10-CM | POA: Diagnosis not present

## 2014-01-15 DIAGNOSIS — F329 Major depressive disorder, single episode, unspecified: Secondary | ICD-10-CM | POA: Diagnosis not present

## 2014-01-15 DIAGNOSIS — F3289 Other specified depressive episodes: Secondary | ICD-10-CM | POA: Diagnosis not present

## 2014-01-26 ENCOUNTER — Encounter: Payer: Self-pay | Admitting: Internal Medicine

## 2014-01-26 ENCOUNTER — Ambulatory Visit (INDEPENDENT_AMBULATORY_CARE_PROVIDER_SITE_OTHER): Payer: Medicare Other | Admitting: Internal Medicine

## 2014-01-26 VITALS — BP 96/62 | HR 70 | Ht 66.0 in | Wt 185.0 lb

## 2014-01-26 DIAGNOSIS — K5732 Diverticulitis of large intestine without perforation or abscess without bleeding: Secondary | ICD-10-CM

## 2014-01-26 DIAGNOSIS — Z933 Colostomy status: Secondary | ICD-10-CM | POA: Diagnosis not present

## 2014-01-26 DIAGNOSIS — K5792 Diverticulitis of intestine, part unspecified, without perforation or abscess without bleeding: Secondary | ICD-10-CM

## 2014-01-26 MED ORDER — MOVIPREP 100 G PO SOLR: 1.0000 | Freq: Once | ORAL | Status: DC

## 2014-01-26 NOTE — Patient Instructions (Signed)

## 2014-01-26 NOTE — Progress Notes (Signed)
HISTORY OF PRESENT ILLNESS:  Valerie Jackson is a 68 y.o. female, retired [employer] scientist, with multiple medical problems as listed below. She has a history of perforated diverticulitis for which she underwent rectosigmoid colectomy with colostomy late January 2015. She is sent today regarding colonoscopy in anticipation of colostomy takedown. She is accompanied by her daughter. She tells me that she had routine screening colonoscopy greater than 10 years ago in [place]. This was apparently unremarkable. GI review of systems is currently negative. Chronic medical problems are stable.  REVIEW OF SYSTEMS:  All non-GI ROS negative upon comprehensive review  Past Medical History  Diagnosis Date  . Hypertension   . Hypothyroid   . Depression   . Hypercholesteremia   . Sickle cell trait   . Embolus   . GERD (gastroesophageal reflux disease)   . History of blood transfusion   . Diverticulitis 09/2013    with perforation  . Pericolonic abscess   . Obesity   . Hypokalemia   . Hiatal hernia     Past Surgical History  Procedure Laterality Date  . Cholecystectomy  2002  . Tonsillectomy  1952  . Colon resection N/A 10/11/2013    Procedure: EXPLORATORY LAPAROTOMY WITH RECTOSIGMOID COLECTOMY AND END COLOSTOMY;  Surgeon: Stark Klein, MD;  Location: [facility];  Service: General;  Laterality: N/A;    Social History [name]  reports that she has quit smoking. Her smoking use included Cigarettes. She has a 6.25 pack-year smoking history. She has never used smokeless tobacco. She reports that she does not drink alcohol or use illicit drugs.  family history includes Aneurysm in her mother; Arthritis in her sister; Breast cancer in her maternal grandmother; Cancer in her mother and other; Hyperlipidemia in her brother and sister; Hypertension in her maternal grandmother, mother, and sister; Kidney disease in her sister; Stroke in her sister; Thyroid cancer in her daughter, mother, and sister.  Allergies   Allergen Reactions  . Estrogens     Blood clot in leg       PHYSICAL EXAMINATION: Vital signs: BP 96/62  Pulse 70  Ht 5' 6"  (1.676 m)  Wt 185 lb (83.915 kg)  BMI 29.87 kg/m2  Constitutional: generally well-appearing, no acute distress Psychiatric: alert and oriented x3, cooperative Eyes: extraocular movements intact, anicteric, conjunctiva pink Mouth: oral pharynx moist, no lesions Neck: supple no lymphadenopathy Cardiovascular: heart regular rate and rhythm, no murmur Lungs: clear to auscultation bilaterally Abdomen: soft, nontender, nondistended, no obvious ascites, no peritoneal signs, normal bowel sounds, no organomegaly. Ostomy in left midabdomen looks fine. Surgical incisions well-healed Rectal: Not performed Extremities: no lower extremity edema bilaterally Skin: no lesions on visible extremities Neuro: No focal deficits.  ASSESSMENT:  #1. History of perforated diverticulitis status post rectosigmoid colectomy with colostomy. Doing well #2. Chronic medical problems. Stable  PLAN:  #1. Colonoscopy via colostomy.The nature of the procedure, as well as the risks, benefits, and alternatives were carefully and thoroughly reviewed with the patient. Ample time for discussion and questions allowed. The patient understood, was satisfied, and agreed to proceed. The procedure is currently scheduled for Wednesday, 01/31/2013

## 2014-01-30 ENCOUNTER — Ambulatory Visit (INDEPENDENT_AMBULATORY_CARE_PROVIDER_SITE_OTHER): Payer: Medicare Other | Admitting: General Surgery

## 2014-01-30 ENCOUNTER — Encounter (INDEPENDENT_AMBULATORY_CARE_PROVIDER_SITE_OTHER): Payer: Self-pay | Admitting: General Surgery

## 2014-01-30 VITALS — BP 124/72 | HR 81 | Temp 98.4°F | Ht 65.0 in | Wt 182.0 lb

## 2014-01-30 DIAGNOSIS — K5720 Diverticulitis of large intestine with perforation and abscess without bleeding: Secondary | ICD-10-CM

## 2014-01-30 DIAGNOSIS — K5732 Diverticulitis of large intestine without perforation or abscess without bleeding: Secondary | ICD-10-CM

## 2014-01-30 NOTE — Patient Instructions (Signed)
End Colostomy Reversal An end colostomy reversal is surgery that reverses an end colostomy. The large intestine is disconnected from the opening in the abdomen (stoma). It is then reconnected to the large intestine inside the body. A stoma and pouch are no longer needed. Bowel movements can resume through the rectum. LET YOUR CAREGIVER KNOW ABOUT:  Allergies to food or medicine.  Medicines taken, including vitamins, health supplements, herbs, eyedrops, over-the-counter medicines, and creams.  Use of steroids (by mouth or creams).  Previous problems with anesthetics or numbing medicines.  History of bleeding problems or blood clots.  Previous surgery.  Other health problems, including diabetes and kidney problems.  Possibility of pregnancy, if this applies. RISKS AND COMPLICATIONS General surgical complications may include:  Reaction to anesthesia.  Damage to surrounding nerves, tissues, or structures.  Blood clot.  Bleeding.  Scarring. Specific risks for colostomy reversal, while rare, may include:  Intestinal paralysis (ileus). This is a normal part of recovery. It usually goes away in 3 to 7 days. However, it can last longer in some people.  Leaking at the joined part of the intestine (anastamotic leak).  Infection of the surgical cut (incision) or the place where the stoma was located.  A collection of pus (abscess) in the abdomen or pelvis.  Intestinal blockage.  Narrowing at the joined part of the intestine (stricture).  Urinary and sexual dysfunction. BEFORE THE PROCEDURE It is important to follow your surgeon's instructions prior to your procedure. This will help you to avoid complications. Steps before your procedure may include:  A physical exam, rectal exam, X-rays, colonoscopy, and other procedures.  Chemotherapy or radiation therapy if the stoma was created for cancer.  A review of the procedure, the anesthesia being used, and what to expect after the  procedure. You may be asked to:  Stop taking certain medicines for several days prior to your procedure. This may include blood thinners (such as aspirin).  Take certain medicines, such as antibiotics or stool softeners.  Avoid eating and drinking after midnight the night before the procedure. This will help you to avoid complications from the anesthesia.  Quit smoking. Smoking increases the chances of a healing problem after your procedure. PROCEDURE  You will be given medicine that makes you sleep (general anesthetic). The procedure may be done as open surgery, with a large incision. It may also be done as laparoscopic surgery, with several smaller incisions. The surgeon will stitch or staple the intestine ends back together. This surgery takes several hours. AFTER THE PROCEDURE  You will be given pain medicine.  Your caregivers will slowly increase your diet and movement.  You can expect to be in the hospital for about 5 to 10 days.  You should arrange for someone to help you with activities at home while you recover. Document Released: 11/23/2011 Document Reviewed: 11/23/2011 [facility] Patient Information 2014 [employer].

## 2014-01-30 NOTE — Progress Notes (Signed)
HISTORY: The patient is a 68 year old female is status post Hartman's procedure for a contained perforated diverticulitis in January 2015.  Her energy level is finally back to normal. She has a colonoscopy scheduled tomorrow. She is eating normally. Her wound is completely healed. She is not having any pain and is on any pain medication. She is quite anxious to get rid of her ostomy.   PERTINENT REVIEW OF SYSTEMS: Otherwise negative x 11.    Filed Vitals:   01/30/14 0938  BP: 124/72  Pulse: 81  Temp: 98.4 F (36.9 C)   Wt Readings from Last 3 Encounters:  01/30/14 182 lb (82.555 kg)  01/26/14 185 lb (83.915 kg)  01/10/14 185 lb (83.915 kg)    EXAM: Head: Normocephalic and atraumatic.  Eyes:  Conjunctivae are normal. Pupils are equal, round, and reactive to light. No scleral icterus.  Neck:  Normal range of motion. Neck supple. No tracheal deviation present. No thyromegaly present.  Resp: No respiratory distress, normal effort. Abd:  Abdomen is soft, non distended and non tender. No masses are palpable.  There is no rebound and no guarding. parastomal hernia is present.   Neurological: Alert and oriented to person, place, and time. Coordination normal.  Skin: Skin is warm and dry. No rash noted. No diaphoretic. No erythema. No pallor.  Psychiatric: Normal mood and affect. Normal behavior. Judgment and thought content normal.      ASSESSMENT AND PLAN:   Diverticulitis of colon with perforation We will plan colostomy takedown with parastomal hernia repair.  She has colonoscopy planned tomorrow.    We discussed risk/benefits of surgery.  I discussed the risk of wound infection, anastomotic leak, bleeding, infection, wound dehiscence, blood clot, pneumonia, or other cardiac or pulmonary complications, risk of needing additional surgery.  She understands and wishes to proceed. I am not going to make her do a full bowel prep next time.    We will do 1/4-1/2 bottle of miralax and  antibiotics.    I reviewed the anatomy and the surgical process.       Milus Height, MD Surgical Oncology, [facility]  [name], MD Janith Lima, MD

## 2014-01-30 NOTE — Assessment & Plan Note (Signed)
We will plan colostomy takedown with parastomal hernia repair.  She has colonoscopy planned tomorrow.    We discussed risk/benefits of surgery.  I discussed the risk of wound infection, anastomotic leak, bleeding, infection, wound dehiscence, blood clot, pneumonia, or other cardiac or pulmonary complications, risk of needing additional surgery.  She understands and wishes to proceed. I am not going to make her do a full bowel prep next time.    We will do 1/4-1/2 bottle of miralax and antibiotics.    I reviewed the anatomy and the surgical process.

## 2014-01-31 ENCOUNTER — Encounter: Payer: Self-pay | Admitting: Internal Medicine

## 2014-01-31 ENCOUNTER — Ambulatory Visit (AMBULATORY_SURGERY_CENTER): Payer: Medicare Other | Admitting: Internal Medicine

## 2014-01-31 ENCOUNTER — Encounter: Payer: Medicare Other | Admitting: Internal Medicine

## 2014-01-31 VITALS — BP 127/79 | HR 59 | Temp 97.4°F | Resp 15 | Ht 66.0 in | Wt 185.0 lb

## 2014-01-31 DIAGNOSIS — D126 Benign neoplasm of colon, unspecified: Secondary | ICD-10-CM

## 2014-01-31 DIAGNOSIS — E669 Obesity, unspecified: Secondary | ICD-10-CM | POA: Diagnosis not present

## 2014-01-31 DIAGNOSIS — K5732 Diverticulitis of large intestine without perforation or abscess without bleeding: Secondary | ICD-10-CM | POA: Diagnosis not present

## 2014-01-31 DIAGNOSIS — E039 Hypothyroidism, unspecified: Secondary | ICD-10-CM | POA: Diagnosis not present

## 2014-01-31 DIAGNOSIS — D573 Sickle-cell trait: Secondary | ICD-10-CM | POA: Diagnosis not present

## 2014-01-31 DIAGNOSIS — I10 Essential (primary) hypertension: Secondary | ICD-10-CM | POA: Diagnosis not present

## 2014-01-31 MED ORDER — SODIUM CHLORIDE 0.9 % IV SOLN: 500.0000 mL | INTRAVENOUS | Status: DC

## 2014-01-31 NOTE — Progress Notes (Signed)
Called to room to assist during endoscopic procedure.  Patient ID and intended procedure confirmed with present staff. Received instructions for my participation in the procedure from the performing physician.  

## 2014-01-31 NOTE — Op Note (Signed)
[place]  [address]. [place], [id number]   COLONOSCOPY PROCEDURE REPORT  PATIENT: Valerie Jackson, Valerie Jackson  MR#: [id number] BIRTHDATE: [birth year] , 67  yrs. old GENDER: Female ENDOSCOPIST: Eustace Quail, MD REFERRED MV:HQION Barry Dienes, M.D. PROCEDURE DATE:  01/31/2014 PROCEDURE:   Colonoscopy with snare polypectomy x 2 First Screening Colonoscopy - Avg.  risk and is 50 yrs.  old or older - No.  Prior Negative Screening - Now for repeat screening. N/A  History of Adenoma - Now for follow-up colonoscopy & has been > or = to 3 yrs.  N/A  Polyps Removed Today? Yes. ASA CLASS:   Class II INDICATIONS:follow up for previously diagnosed diverticulitis status post partial colectomy with colostomy. Reversal anticipated.  MEDICATIONS: MAC sedation, administered by CRNA and propofol (Diprivan) 362m IV  DESCRIPTION OF PROCEDURE:   After the risks benefits and alternatives of the procedure were thoroughly explained, informed consent was obtained.  A digital rectal exam revealed no abnormalities of the rectum.   The LB [id number] endoscope was introduced through the anus and advanced to the cecum, which was identified by both the appendix and ileocecal valve. No adverse events experienced.   The quality of the prep was excellent, using MoviPrep  The instrument was then slowly withdrawn as the colon was fully examined.  COLON FINDINGS:  The scope was inserted the the ostomy.Two polyps were found in the ascending  (7 mm) and transverse  (3 mm) colon. A polypectomy was performed with a cold snare.  The resection was complete and the polyp tissue was completely retrieved.   The colon mucosa was otherwise normal. The rectal stump was intubated and was grossly normal to the surgical suture line.  Retroflexion was not performed due to a narrow rectal vault. The time to cecum=5 minutes 13 seconds.  Withdrawal time=7 minutes 27 seconds.  The scope was withdrawn and the  procedure completed. COMPLICATIONS: There were no complications.  ENDOSCOPIC IMPRESSION: 1.   Two polyps were found in the ascending colon and transverse colon; polypectomy was performed with a cold snare 2.   The colon mucosa  (including evaluation of the rectal stump) was otherwise normal  RECOMMENDATIONS: 1.  Repeat colonoscopy in 5 years if polyp adenomatous; otherwise 10 years 2.   Return to Dr. BBarry Dienesfor surgery as planned.   eSigned:  JEustace Quail MD 01/31/2014 11:46 AM   cc: FStark Klein MD, TJanith Lima MD, and The Patient

## 2014-01-31 NOTE — Progress Notes (Signed)
Procedure ends, to recovery, report given and VSS. 

## 2014-01-31 NOTE — Progress Notes (Signed)
New ostomy bag applied to site per pt

## 2014-01-31 NOTE — Patient Instructions (Signed)
YOU HAD AN ENDOSCOPIC PROCEDURE TODAY AT [facility]: Refer to the procedure report that was given to you for any specific questions about what was found during the examination.  If the procedure report does not answer your questions, please call your gastroenterologist to clarify.  If you requested that your care partner not be given the details of your procedure findings, then the procedure report has been included in a sealed envelope for you to review at your convenience later.  YOU SHOULD EXPECT: Some feelings of bloating in the abdomen. Passage of more gas than usual.  Walking can help get rid of the air that was put into your GI tract during the procedure and reduce the bloating. If you had a lower endoscopy (such as a colonoscopy or flexible sigmoidoscopy) you may notice spotting of blood in your stool or on the toilet paper. If you underwent a bowel prep for your procedure, then you may not have a normal bowel movement for a few days.  DIET: Your first meal following the procedure should be a light meal and then it is ok to progress to your normal diet.  A half-sandwich or bowl of soup is an example of a good first meal.  Heavy or fried foods are harder to digest and may make you feel nauseous or bloated.  Likewise meals heavy in dairy and vegetables can cause extra gas to form and this can also increase the bloating.  Drink plenty of fluids but you should avoid alcoholic beverages for 24 hours.  ACTIVITY: Your care partner should take you home directly after the procedure.  You should plan to take it easy, moving slowly for the rest of the day.  You can resume normal activity the day after the procedure however you should NOT DRIVE or use heavy machinery for 24 hours (because of the sedation medicines used during the test).    SYMPTOMS TO REPORT IMMEDIATELY: A gastroenterologist can be reached at any hour.  During normal business hours, 8:30 AM to 5:00 PM Monday through Friday,  call [phone].  After hours and on weekends, please call the GI answering service at [phone] who will take a message and have the physician on call contact you.   Following lower endoscopy (colonoscopy or flexible sigmoidoscopy):  Excessive amounts of blood in the stool  Significant tenderness or worsening of abdominal pains  Swelling of the abdomen that is new, acute  Fever of 100F or higher FOLLOW UP: If any biopsies were taken you will be contacted by phone or by letter within the next 1-3 weeks.  Call your gastroenterologist if you have not heard about the biopsies in 3 weeks.  Our staff will call the home number listed on your records the next business day following your procedure to check on you and address any questions or concerns that you may have at that time regarding the information given to you following your procedure. This is a courtesy call and so if there is no answer at the home number and we have not heard from you through the emergency physician on call, we will assume that you have returned to your regular daily activities without incident.  SIGNATURES/CONFIDENTIALITY: You and/or your care partner have signed paperwork which will be entered into your electronic medical record.  These signatures attest to the fact that that the information above on your After Visit Summary has been reviewed and is understood.  Full responsibility of the confidentiality of this discharge  information lies with you and/or your care-partner.  Await pathology  Please read handout about polyps  Continue your normal medications  Please return to Dr. Barry Dienes for surgery as planned

## 2014-02-01 ENCOUNTER — Telehealth: Payer: Self-pay | Admitting: *Deleted

## 2014-02-01 DIAGNOSIS — K5732 Diverticulitis of large intestine without perforation or abscess without bleeding: Secondary | ICD-10-CM | POA: Diagnosis not present

## 2014-02-01 DIAGNOSIS — F329 Major depressive disorder, single episode, unspecified: Secondary | ICD-10-CM | POA: Diagnosis not present

## 2014-02-01 DIAGNOSIS — F3289 Other specified depressive episodes: Secondary | ICD-10-CM | POA: Diagnosis not present

## 2014-02-01 DIAGNOSIS — K630 Abscess of intestine: Secondary | ICD-10-CM | POA: Diagnosis not present

## 2014-02-01 DIAGNOSIS — I10 Essential (primary) hypertension: Secondary | ICD-10-CM | POA: Diagnosis not present

## 2014-02-01 DIAGNOSIS — Z433 Encounter for attention to colostomy: Secondary | ICD-10-CM | POA: Diagnosis not present

## 2014-02-01 DIAGNOSIS — Z48815 Encounter for surgical aftercare following surgery on the digestive system: Secondary | ICD-10-CM | POA: Diagnosis not present

## 2014-02-01 NOTE — Telephone Encounter (Signed)
  Follow up Call-  Call back number 01/31/2014  Post procedure Call Back phone  # [phone]  Permission to leave phone message Yes     Patient questions:  Message left to call us if necessary.

## 2014-02-06 ENCOUNTER — Encounter: Payer: Self-pay | Admitting: Internal Medicine

## 2014-02-07 ENCOUNTER — Other Ambulatory Visit: Payer: Self-pay | Admitting: Internal Medicine

## 2014-02-07 ENCOUNTER — Telehealth: Payer: Self-pay

## 2014-02-07 ENCOUNTER — Other Ambulatory Visit (INDEPENDENT_AMBULATORY_CARE_PROVIDER_SITE_OTHER): Payer: Medicare Other

## 2014-02-07 DIAGNOSIS — R9389 Abnormal findings on diagnostic imaging of other specified body structures: Secondary | ICD-10-CM

## 2014-02-07 DIAGNOSIS — I10 Essential (primary) hypertension: Secondary | ICD-10-CM

## 2014-02-07 DIAGNOSIS — R918 Other nonspecific abnormal finding of lung field: Secondary | ICD-10-CM

## 2014-02-07 DIAGNOSIS — J439 Emphysema, unspecified: Secondary | ICD-10-CM

## 2014-02-07 LAB — CREATININE, SERUM: Creatinine, Ser: 1.3 mg/dL — ABNORMAL HIGH (ref 0.4–1.2)

## 2014-02-07 LAB — BUN: BUN: 19 mg/dL (ref 6–23)

## 2014-02-07 NOTE — Telephone Encounter (Signed)
Returned call to patient to advised recheck lungs per MD//phone rang and then D/C, will try again later.

## 2014-02-07 NOTE — Telephone Encounter (Signed)
Pt.notified

## 2014-02-07 NOTE — Telephone Encounter (Signed)
Per [facility], pt contacted to provided appointment information for CT. Pt would like to know the reason CT is being ordered?

## 2014-02-07 NOTE — Telephone Encounter (Signed)
To recheck the lung nodules and abnormal lymph nodes

## 2014-02-08 ENCOUNTER — Ambulatory Visit (INDEPENDENT_AMBULATORY_CARE_PROVIDER_SITE_OTHER)
Admission: RE | Admit: 2014-02-08 | Discharge: 2014-02-08 | Disposition: A | Discharge status: Home / Self Care | Payer: Medicare Other | Source: Ambulatory Visit | Attending: Internal Medicine | Admitting: Internal Medicine

## 2014-02-08 ENCOUNTER — Encounter: Payer: Self-pay | Admitting: Internal Medicine

## 2014-02-08 DIAGNOSIS — J438 Other emphysema: Secondary | ICD-10-CM | POA: Diagnosis not present

## 2014-02-08 DIAGNOSIS — J439 Emphysema, unspecified: Secondary | ICD-10-CM

## 2014-02-08 DIAGNOSIS — J984 Other disorders of lung: Secondary | ICD-10-CM | POA: Diagnosis not present

## 2014-02-08 DIAGNOSIS — R918 Other nonspecific abnormal finding of lung field: Secondary | ICD-10-CM | POA: Diagnosis not present

## 2014-02-08 DIAGNOSIS — R9389 Abnormal findings on diagnostic imaging of other specified body structures: Secondary | ICD-10-CM

## 2014-02-08 LAB — HM COLONOSCOPY

## 2014-02-08 MED ORDER — IOHEXOL 300 MG/ML  SOLN
80.0000 mL | Freq: Once | INTRAMUSCULAR | Status: AC | PRN
  Administered 2014-02-08: 80 mL via INTRAVENOUS

## 2014-02-14 ENCOUNTER — Encounter (HOSPITAL_COMMUNITY): Payer: Self-pay | Admitting: Pharmacy Technician

## 2014-02-20 NOTE — Progress Notes (Signed)
ekg 11-08-13 epic Chest ct 02-08-14 epic

## 2014-02-20 NOTE — Patient Instructions (Addendum)
[place]  02/20/2014   Your procedure is scheduled on: Tuesday june 16th, 2015  Report to [facility] Main Entrance and follow signs to  [place] at 530AM.  Call this number if you have problems the morning of surgery [phone]   Remember:  Do not eat food or drink liquids :After Midnight.     Take these medicines the morning of surgery with A SIP OF WATER: spiriva, synthroid                               You may not have any metal on your body including hair pins and piercings  Do not wear jewelry, make-up, lotions, powders, or deodorant.   Men may shave face and neck.  Do not bring valuables to the hospital. [place].  Contacts, dentures or bridgework may not be worn into surgery.  Leave suitcase in the car. After surgery it may be brought to your room.  For patients admitted to the hospital, checkout time is 11:00 AM the day of discharge.   Patients discharged the day of surgery will not be allowed to drive home.  Name and phone number of your driver:  Special Instructions: N/A ________________________________________________________________________  [facility] - Preparing for Surgery Before surgery, you can play an important role.  Because skin is not sterile, your skin needs to be as free of germs as possible.  You can reduce the number of germs on your skin by washing with CHG (chlorahexidine gluconate) soap before surgery.  CHG is an antiseptic cleaner which kills germs and bonds with the skin to continue killing germs even after washing. Please DO NOT use if you have an allergy to CHG or antibacterial soaps.  If your skin becomes reddened/irritated stop using the CHG and inform your nurse when you arrive at Short Stay. Do not shave (including legs and underarms) for at least 48 hours prior to the first CHG shower.  You may shave your face/neck. Please follow these instructions carefully:  1.  Shower with CHG Soap  the night before surgery and the  morning of Surgery.  2.  If you choose to wash your hair, wash your hair first as usual with your  normal  shampoo.  3.  After you shampoo, rinse your hair and body thoroughly to remove the  shampoo.                           4.  Use CHG as you would any other liquid soap.  You can apply chg directly  to the skin and wash                       Gently with a scrungie or clean washcloth.  5.  Apply the CHG Soap to your body ONLY FROM THE NECK DOWN.   Do not use on face/ open                           Wound or open sores. Avoid contact with eyes, ears mouth and genitals (private parts).                       Wash face,  Genitals (private parts) with your normal soap.  6.  Wash thoroughly, paying special attention to the area where your surgery  will be performed.  7.  Thoroughly rinse your body with warm water from the neck down.  8.  DO NOT shower/wash with your normal soap after using and rinsing off  the CHG Soap.                9.  Pat yourself dry with a clean towel.            10.  Wear clean pajamas.            11.  Place clean sheets on your bed the night of your first shower and do not  sleep with pets. Day of Surgery : Do not apply any lotions/deodorants the morning of surgery.  Please wear clean clothes to the hospital/surgery center.  FAILURE TO FOLLOW THESE INSTRUCTIONS MAY RESULT IN THE CANCELLATION OF YOUR SURGERY PATIENT SIGNATURE_________________________________  NURSE SIGNATURE__________________________________  ________________________________________________________________________

## 2014-02-21 ENCOUNTER — Encounter (HOSPITAL_COMMUNITY): Payer: Self-pay

## 2014-02-21 ENCOUNTER — Encounter (HOSPITAL_COMMUNITY)
Admission: RE | Admit: 2014-02-21 | Discharge: 2014-02-21 | Disposition: A | Discharge status: Home / Self Care | Payer: Medicare Other | Source: Ambulatory Visit | Attending: General Surgery | Admitting: General Surgery

## 2014-02-21 DIAGNOSIS — Z01818 Encounter for other preprocedural examination: Secondary | ICD-10-CM | POA: Diagnosis not present

## 2014-02-21 DIAGNOSIS — Z01812 Encounter for preprocedural laboratory examination: Secondary | ICD-10-CM | POA: Diagnosis not present

## 2014-02-21 HISTORY — DX: Unspecified osteoarthritis, unspecified site: M19.90

## 2014-02-21 HISTORY — DX: Acute embolism and thrombosis of unspecified vein: I82.90

## 2014-02-21 HISTORY — DX: Inflammatory liver disease, unspecified: K75.9

## 2014-02-21 HISTORY — DX: Headache: R51

## 2014-02-21 HISTORY — DX: Personal history of other diseases of the digestive system: Z87.19

## 2014-02-21 HISTORY — DX: Cardiac murmur, unspecified: R01.1

## 2014-02-21 LAB — COMPREHENSIVE METABOLIC PANEL
ALT: 29 U/L (ref 0–35)
AST: 20 U/L (ref 0–37)
Albumin: 3.9 g/dL (ref 3.5–5.2)
Alkaline Phosphatase: 92 U/L (ref 39–117)
BUN: 23 mg/dL (ref 6–23)
CO2: 26 mEq/L (ref 19–32)
Calcium: 10.5 mg/dL (ref 8.4–10.5)
Chloride: 101 mEq/L (ref 96–112)
Creatinine, Ser: 1.2 mg/dL — ABNORMAL HIGH (ref 0.50–1.10)
GFR calc Af Amer: 53 mL/min — ABNORMAL LOW (ref >90)
GFR calc non Af Amer: 45 mL/min — ABNORMAL LOW (ref >90)
Glucose, Bld: 117 mg/dL — ABNORMAL HIGH (ref 70–99)
Potassium: 3.6 mEq/L — ABNORMAL LOW (ref 3.7–5.3)
Sodium: 142 mEq/L (ref 137–147)
Total Bilirubin: 0.5 mg/dL (ref 0.3–1.2)
Total Protein: 7.9 g/dL (ref 6.0–8.3)

## 2014-02-21 LAB — CBC WITH DIFFERENTIAL/PLATELET
Basophils Absolute: 0.1 10*3/uL (ref 0.0–0.1)
Basophils Relative: 1 % (ref 0–1)
Eosinophils Absolute: 0.4 10*3/uL (ref 0.0–0.7)
Eosinophils Relative: 5 % (ref 0–5)
HCT: 38.4 % (ref 36.0–46.0)
Hemoglobin: 13 g/dL (ref 12.0–15.0)
Lymphocytes Relative: 27 % (ref 12–46)
Lymphs Abs: 2.2 10*3/uL (ref 0.7–4.0)
MCH: 29.3 pg (ref 26.0–34.0)
MCHC: 33.9 g/dL (ref 30.0–36.0)
MCV: 86.7 fL (ref 78.0–100.0)
Monocytes Absolute: 0.5 10*3/uL (ref 0.1–1.0)
Monocytes Relative: 6 % (ref 3–12)
Neutro Abs: 4.9 10*3/uL (ref 1.7–7.7)
Neutrophils Relative %: 61 % (ref 43–77)
Platelets: 244 10*3/uL (ref 150–400)
RBC: 4.43 MIL/uL (ref 3.87–5.11)
RDW: 13.1 % (ref 11.5–15.5)
WBC: 8 10*3/uL (ref 4.0–10.5)

## 2014-02-21 LAB — ABO/RH: ABO/RH(D): A POS

## 2014-02-21 LAB — APTT: aPTT: 27 seconds (ref 24–37)

## 2014-02-21 LAB — PROTIME-INR
INR: 1.04 (ref 0.00–1.49)
Prothrombin Time: 13.4 seconds (ref 11.6–15.2)

## 2014-02-21 NOTE — Progress Notes (Signed)
02/21/14 1029  OBSTRUCTIVE SLEEP APNEA  Have you ever been diagnosed with sleep apnea through a sleep study? No  Do you snore loudly (loud enough to be heard through closed doors)?  1  Do you often feel tired, fatigued, or sleepy during the daytime? 1  Has anyone observed you stop breathing during your sleep? 0  Do you have, or are you being treated for high blood pressure? 1  BMI more than 35 kg/m2? 0  Age over 69 years old? 1  Neck circumference greater than 40 cm/16 inches? 0  Gender: 0  Obstructive Sleep Apnea Score 4  Score 4 or greater  Results sent to PCP

## 2014-02-26 ENCOUNTER — Encounter (HOSPITAL_COMMUNITY): Payer: Self-pay | Admitting: Anesthesiology

## 2014-02-26 NOTE — Anesthesia Preprocedure Evaluation (Addendum)
Anesthesia Evaluation  Patient identified by MRN, date of birth, ID band Patient awake    Reviewed: Allergy & Precautions, H&P , NPO status , Patient's Chart, lab work & pertinent test results  Airway Mallampati: II TM Distance: >3 FB Neck ROM: Full    Dental no notable dental hx.    Pulmonary COPD COPD inhaler, former smoker,  breath sounds clear to auscultation  Pulmonary exam normal       Cardiovascular hypertension, Pt. on medications Rhythm:Regular Rate:Normal     Neuro/Psych  Headaches, PSYCHIATRIC DISORDERS Depression    GI/Hepatic hiatal hernia, GERD-  ,(+) Hepatitis -Hepatitis age 65. H/O nonspecific elevations of LFTs   Endo/Other  Hypothyroidism   Renal/GU negative Renal ROS  negative genitourinary   Musculoskeletal negative musculoskeletal ROS (+)   Abdominal (+) + obese,   Peds negative pediatric ROS (+)  Hematology negative hematology ROS (+)   Anesthesia Other Findings   Reproductive/Obstetrics negative OB ROS                          Anesthesia Physical Anesthesia Plan  ASA: III  Anesthesia Plan: General   Post-op Pain Management:    Induction: Intravenous  Airway Management Planned: Oral ETT  Additional Equipment:   Intra-op Plan:   Post-operative Plan: Extubation in OR  Informed Consent: I have reviewed the patients History and Physical, chart, labs and discussed the procedure including the risks, benefits and alternatives for the proposed anesthesia with the patient or authorized representative who has indicated his/her understanding and acceptance.   Dental advisory given  Plan Discussed with: CRNA  Anesthesia Plan Comments:         Anesthesia Quick Evaluation

## 2014-02-27 ENCOUNTER — Inpatient Hospital Stay (HOSPITAL_COMMUNITY)
Admission: RE | Admit: 2014-02-27 | Discharge: 2014-03-05 | DRG: 345 | Disposition: A | Discharge status: Home / Self Care | Payer: Medicare Other | Source: Ambulatory Visit | Attending: General Surgery | Admitting: General Surgery

## 2014-02-27 ENCOUNTER — Encounter (HOSPITAL_COMMUNITY): Payer: Self-pay

## 2014-02-27 ENCOUNTER — Inpatient Hospital Stay (HOSPITAL_COMMUNITY): Payer: Medicare Other | Admitting: Anesthesiology

## 2014-02-27 ENCOUNTER — Encounter (HOSPITAL_COMMUNITY): Payer: Medicare Other | Admitting: Anesthesiology

## 2014-02-27 ENCOUNTER — Encounter (HOSPITAL_COMMUNITY)
Admission: RE | Disposition: A | Discharge status: Home / Self Care | Payer: Self-pay | Source: Ambulatory Visit | Attending: General Surgery

## 2014-02-27 DIAGNOSIS — I129 Hypertensive chronic kidney disease with stage 1 through stage 4 chronic kidney disease, or unspecified chronic kidney disease: Secondary | ICD-10-CM | POA: Diagnosis present

## 2014-02-27 DIAGNOSIS — J438 Other emphysema: Secondary | ICD-10-CM | POA: Diagnosis present

## 2014-02-27 DIAGNOSIS — Z87891 Personal history of nicotine dependence: Secondary | ICD-10-CM

## 2014-02-27 DIAGNOSIS — Z6832 Body mass index (BMI) 32.0-32.9, adult: Secondary | ICD-10-CM

## 2014-02-27 DIAGNOSIS — K435 Parastomal hernia without obstruction or  gangrene: Secondary | ICD-10-CM | POA: Diagnosis present

## 2014-02-27 DIAGNOSIS — IMO0002 Reserved for concepts with insufficient information to code with codable children: Principal | ICD-10-CM | POA: Diagnosis present

## 2014-02-27 DIAGNOSIS — E876 Hypokalemia: Secondary | ICD-10-CM | POA: Diagnosis not present

## 2014-02-27 DIAGNOSIS — Z79899 Other long term (current) drug therapy: Secondary | ICD-10-CM | POA: Diagnosis not present

## 2014-02-27 DIAGNOSIS — E669 Obesity, unspecified: Secondary | ICD-10-CM | POA: Diagnosis present

## 2014-02-27 DIAGNOSIS — D573 Sickle-cell trait: Secondary | ICD-10-CM | POA: Diagnosis present

## 2014-02-27 DIAGNOSIS — Z433 Encounter for attention to colostomy: Secondary | ICD-10-CM

## 2014-02-27 DIAGNOSIS — K5792 Diverticulitis of intestine, part unspecified, without perforation or abscess without bleeding: Secondary | ICD-10-CM | POA: Diagnosis present

## 2014-02-27 DIAGNOSIS — I10 Essential (primary) hypertension: Secondary | ICD-10-CM | POA: Diagnosis not present

## 2014-02-27 DIAGNOSIS — K5732 Diverticulitis of large intestine without perforation or abscess without bleeding: Secondary | ICD-10-CM | POA: Diagnosis not present

## 2014-02-27 DIAGNOSIS — J439 Emphysema, unspecified: Secondary | ICD-10-CM | POA: Diagnosis present

## 2014-02-27 DIAGNOSIS — K219 Gastro-esophageal reflux disease without esophagitis: Secondary | ICD-10-CM | POA: Diagnosis present

## 2014-02-27 DIAGNOSIS — K5700 Diverticulitis of small intestine with perforation and abscess without bleeding: Secondary | ICD-10-CM

## 2014-02-27 DIAGNOSIS — E039 Hypothyroidism, unspecified: Secondary | ICD-10-CM | POA: Diagnosis present

## 2014-02-27 DIAGNOSIS — E785 Hyperlipidemia, unspecified: Secondary | ICD-10-CM | POA: Diagnosis present

## 2014-02-27 DIAGNOSIS — N182 Chronic kidney disease, stage 2 (mild): Secondary | ICD-10-CM

## 2014-02-27 DIAGNOSIS — J449 Chronic obstructive pulmonary disease, unspecified: Secondary | ICD-10-CM

## 2014-02-27 HISTORY — PX: COLOSTOMY TAKEDOWN: SHX5783

## 2014-02-27 LAB — CBC
HCT: 31.5 % — ABNORMAL LOW (ref 36.0–46.0)
Hemoglobin: 10.8 g/dL — ABNORMAL LOW (ref 12.0–15.0)
MCH: 29.8 pg (ref 26.0–34.0)
MCHC: 34.3 g/dL (ref 30.0–36.0)
MCV: 86.8 fL (ref 78.0–100.0)
Platelets: 210 10*3/uL (ref 150–400)
RBC: 3.63 MIL/uL — ABNORMAL LOW (ref 3.87–5.11)
RDW: 13 % (ref 11.5–15.5)
WBC: 16.4 10*3/uL — ABNORMAL HIGH (ref 4.0–10.5)

## 2014-02-27 LAB — CREATININE, SERUM
Creatinine, Ser: 1.18 mg/dL — ABNORMAL HIGH (ref 0.50–1.10)
GFR calc Af Amer: 53 mL/min — ABNORMAL LOW (ref >90)
GFR calc non Af Amer: 46 mL/min — ABNORMAL LOW (ref >90)

## 2014-02-27 LAB — TYPE AND SCREEN
ABO/RH(D): A POS
Antibody Screen: NEGATIVE

## 2014-02-27 SURGERY — CLOSURE, COLOSTOMY
Anesthesia: General

## 2014-02-27 MED ORDER — GLYCOPYRROLATE 0.2 MG/ML IJ SOLN
INTRAMUSCULAR | Status: DC | PRN
  Administered 2014-02-27: 0.6 mg via INTRAVENOUS

## 2014-02-27 MED ORDER — MIDAZOLAM HCL 5 MG/5ML IJ SOLN
INTRAMUSCULAR | Status: DC | PRN
  Administered 2014-02-27: 2 mg via INTRAVENOUS

## 2014-02-27 MED ORDER — LIDOCAINE HCL (CARDIAC) 20 MG/ML IV SOLN
INTRAVENOUS | Status: DC | PRN
  Administered 2014-02-27: 60 mg via INTRAVENOUS

## 2014-02-27 MED ORDER — CEFAZOLIN SODIUM-DEXTROSE 2-3 GM-% IV SOLR
INTRAVENOUS | Status: AC
  Filled 2014-02-27: qty 50

## 2014-02-27 MED ORDER — METRONIDAZOLE IN NACL 5-0.79 MG/ML-% IV SOLN
INTRAVENOUS | Status: DC | PRN
  Administered 2014-02-27: 500 mg via INTRAVENOUS

## 2014-02-27 MED ORDER — HYDROMORPHONE HCL PF 2 MG/ML IJ SOLN
INTRAMUSCULAR | Status: AC
  Filled 2014-02-27: qty 1

## 2014-02-27 MED ORDER — ALVIMOPAN 12 MG PO CAPS: 12.0000 mg | ORAL_CAPSULE | Freq: Two times a day (BID) | ORAL | Status: DC

## 2014-02-27 MED ORDER — LEVOTHYROXINE SODIUM 125 MCG PO TABS
125.0000 ug | ORAL_TABLET | Freq: Every day | ORAL | Status: DC
  Administered 2014-02-28 – 2014-03-05 (×6): 125 ug via ORAL
  Filled 2014-02-27 (×10): qty 1

## 2014-02-27 MED ORDER — HYDROCHLOROTHIAZIDE 12.5 MG PO CAPS
12.5000 mg | ORAL_CAPSULE | Freq: Every morning | ORAL | Status: DC
  Administered 2014-02-28 – 2014-03-04 (×5): 12.5 mg via ORAL
  Filled 2014-02-27 (×7): qty 1

## 2014-02-27 MED ORDER — HYDROMORPHONE HCL PF 1 MG/ML IJ SOLN: 0.2500 mg | INTRAMUSCULAR | Status: DC | PRN

## 2014-02-27 MED ORDER — 0.9 % SODIUM CHLORIDE (POUR BTL) OPTIME
TOPICAL | Status: DC | PRN
Start: 2014-02-27 — End: 2014-02-27
  Administered 2014-02-27: 2000 mL

## 2014-02-27 MED ORDER — PROPOFOL 10 MG/ML IV BOLUS
INTRAVENOUS | Status: AC
  Filled 2014-02-27: qty 20

## 2014-02-27 MED ORDER — DEXAMETHASONE SODIUM PHOSPHATE 10 MG/ML IJ SOLN
INTRAMUSCULAR | Status: AC
  Filled 2014-02-27: qty 1

## 2014-02-27 MED ORDER — PHENYLEPHRINE HCL 10 MG/ML IJ SOLN
INTRAMUSCULAR | Status: DC | PRN
  Administered 2014-02-27: 60 ug via INTRAVENOUS
  Administered 2014-02-27 (×3): 40 ug via INTRAVENOUS
  Administered 2014-02-27: 60 ug via INTRAVENOUS
  Administered 2014-02-27: 40 ug via INTRAVENOUS

## 2014-02-27 MED ORDER — DEXAMETHASONE SODIUM PHOSPHATE 10 MG/ML IJ SOLN
INTRAMUSCULAR | Status: DC | PRN
  Administered 2014-02-27: 10 mg via INTRAVENOUS

## 2014-02-27 MED ORDER — SODIUM CHLORIDE 0.9 % IJ SOLN: 9.0000 mL | INTRAMUSCULAR | Status: DC | PRN

## 2014-02-27 MED ORDER — LIDOCAINE HCL 1 % IJ SOLN
INTRAMUSCULAR | Status: AC
  Filled 2014-02-27: qty 20

## 2014-02-27 MED ORDER — CEFAZOLIN SODIUM-DEXTROSE 2-3 GM-% IV SOLR
2.0000 g | INTRAVENOUS | Status: AC
Start: 2014-02-27 — End: 2014-02-27
  Administered 2014-02-27: 2 g via INTRAVENOUS

## 2014-02-27 MED ORDER — NALOXONE HCL 0.4 MG/ML IJ SOLN: 0.4000 mg | INTRAMUSCULAR | Status: DC | PRN

## 2014-02-27 MED ORDER — MORPHINE SULFATE 2 MG/ML IJ SOLN
1.0000 mg | INTRAMUSCULAR | Status: DC | PRN
  Administered 2014-03-01 – 2014-03-02 (×4): 2 mg via INTRAVENOUS
  Filled 2014-02-27 (×5): qty 1

## 2014-02-27 MED ORDER — ENOXAPARIN SODIUM 40 MG/0.4ML ~~LOC~~ SOLN
40.0000 mg | SUBCUTANEOUS | Status: DC
  Administered 2014-02-28 – 2014-03-04 (×5): 40 mg via SUBCUTANEOUS
  Filled 2014-02-27 (×9): qty 0.4

## 2014-02-27 MED ORDER — TIOTROPIUM BROMIDE MONOHYDRATE 18 MCG IN CAPS
18.0000 ug | ORAL_CAPSULE | Freq: Every day | RESPIRATORY_TRACT | Status: DC
  Administered 2014-02-28 – 2014-03-03 (×4): 18 ug via RESPIRATORY_TRACT
  Filled 2014-02-27: qty 5

## 2014-02-27 MED ORDER — CISATRACURIUM BESYLATE 20 MG/10ML IV SOLN
INTRAVENOUS | Status: AC
  Filled 2014-02-27: qty 10

## 2014-02-27 MED ORDER — METRONIDAZOLE IN NACL 5-0.79 MG/ML-% IV SOLN
INTRAVENOUS | Status: AC
  Filled 2014-02-27: qty 100

## 2014-02-27 MED ORDER — KCL IN DEXTROSE-NACL 20-5-0.45 MEQ/L-%-% IV SOLN
INTRAVENOUS | Status: DC
  Administered 2014-02-27 – 2014-03-02 (×6): via INTRAVENOUS
  Filled 2014-02-27 (×8): qty 1000

## 2014-02-27 MED ORDER — MECLIZINE HCL 12.5 MG PO TABS
12.5000 mg | ORAL_TABLET | Freq: Three times a day (TID) | ORAL | Status: DC | PRN
  Filled 2014-02-27: qty 1

## 2014-02-27 MED ORDER — BUPIVACAINE ON-Q PAIN PUMP (FOR ORDER SET NO CHG)
INJECTION | Status: DC
Start: 2014-02-27 — End: 2014-03-02
  Filled 2014-02-27: qty 1

## 2014-02-27 MED ORDER — NEOSTIGMINE METHYLSULFATE 10 MG/10ML IV SOLN
INTRAVENOUS | Status: AC
  Filled 2014-02-27: qty 1

## 2014-02-27 MED ORDER — METRONIDAZOLE IN NACL 5-0.79 MG/ML-% IV SOLN
500.0000 mg | Freq: Three times a day (TID) | INTRAVENOUS | Status: AC
  Administered 2014-02-27 – 2014-02-28 (×2): 500 mg via INTRAVENOUS
  Filled 2014-02-27 (×2): qty 100

## 2014-02-27 MED ORDER — MORPHINE SULFATE (PF) 1 MG/ML IV SOLN
INTRAVENOUS | Status: DC
  Administered 2014-02-27: 1.5 mg via INTRAVENOUS
  Administered 2014-02-27: 11:00:00 via INTRAVENOUS
  Administered 2014-02-27: 18 mg via INTRAVENOUS
  Administered 2014-02-27: 17:00:00 via INTRAVENOUS
  Administered 2014-02-28: 12 mg via INTRAVENOUS
  Administered 2014-02-28 (×2): 1.5 mg via INTRAVENOUS
  Filled 2014-02-27: qty 25

## 2014-02-27 MED ORDER — MORPHINE SULFATE (PF) 1 MG/ML IV SOLN
INTRAVENOUS | Status: AC
  Filled 2014-02-27: qty 25

## 2014-02-27 MED ORDER — CISATRACURIUM BESYLATE (PF) 10 MG/5ML IV SOLN
INTRAVENOUS | Status: DC | PRN
  Administered 2014-02-27: 8 mg via INTRAVENOUS
  Administered 2014-02-27: 4 mg via INTRAVENOUS
  Administered 2014-02-27 (×2): 2 mg via INTRAVENOUS

## 2014-02-27 MED ORDER — DIPHENHYDRAMINE HCL 12.5 MG/5ML PO ELIX: 12.5000 mg | ORAL_SOLUTION | Freq: Four times a day (QID) | ORAL | Status: DC | PRN

## 2014-02-27 MED ORDER — GLYCOPYRROLATE 0.2 MG/ML IJ SOLN
INTRAMUSCULAR | Status: AC
  Filled 2014-02-27: qty 3

## 2014-02-27 MED ORDER — EPHEDRINE SULFATE 50 MG/ML IJ SOLN
INTRAMUSCULAR | Status: DC | PRN
  Administered 2014-02-27: 5 mg via INTRAVENOUS

## 2014-02-27 MED ORDER — BUPIVACAINE 0.25 % ON-Q PUMP DUAL CATH 300 ML
300.0000 mL | INJECTION | Status: DC
  Administered 2014-02-27: 300 mL
  Filled 2014-02-27: qty 300

## 2014-02-27 MED ORDER — ACETAMINOPHEN 500 MG PO TABS
1000.0000 mg | ORAL_TABLET | Freq: Four times a day (QID) | ORAL | Status: DC
  Filled 2014-02-27: qty 2

## 2014-02-27 MED ORDER — LACTATED RINGERS IV SOLN
INTRAVENOUS | Status: DC | PRN
  Administered 2014-02-27 (×3): via INTRAVENOUS

## 2014-02-27 MED ORDER — HYDROMORPHONE HCL PF 1 MG/ML IJ SOLN
INTRAMUSCULAR | Status: DC | PRN
  Administered 2014-02-27 (×3): 0.5 mg via INTRAVENOUS
  Administered 2014-02-27: 1 mg via INTRAVENOUS

## 2014-02-27 MED ORDER — FENTANYL CITRATE 0.05 MG/ML IJ SOLN
INTRAMUSCULAR | Status: AC
  Filled 2014-02-27: qty 5

## 2014-02-27 MED ORDER — ONDANSETRON HCL 4 MG PO TABS
4.0000 mg | ORAL_TABLET | Freq: Four times a day (QID) | ORAL | Status: DC | PRN
Start: 2014-02-27 — End: 2014-03-05
  Administered 2014-03-02: 4 mg via ORAL
  Filled 2014-02-27: qty 1

## 2014-02-27 MED ORDER — PHENYLEPHRINE 40 MCG/ML (10ML) SYRINGE FOR IV PUSH (FOR BLOOD PRESSURE SUPPORT)
PREFILLED_SYRINGE | INTRAVENOUS | Status: AC
  Filled 2014-02-27: qty 10

## 2014-02-27 MED ORDER — NEOSTIGMINE METHYLSULFATE 10 MG/10ML IV SOLN
INTRAVENOUS | Status: DC | PRN
  Administered 2014-02-27: 4 mg via INTRAVENOUS

## 2014-02-27 MED ORDER — DIPHENHYDRAMINE HCL 50 MG/ML IJ SOLN: 12.5000 mg | Freq: Four times a day (QID) | INTRAMUSCULAR | Status: DC | PRN

## 2014-02-27 MED ORDER — TIOTROPIUM BROMIDE MONOHYDRATE 2.5 MCG/ACT IN AERS: 2.0000 | INHALATION_SPRAY | Freq: Every day | RESPIRATORY_TRACT | Status: DC

## 2014-02-27 MED ORDER — PROMETHAZINE HCL 25 MG/ML IJ SOLN: 6.2500 mg | INTRAMUSCULAR | Status: DC | PRN

## 2014-02-27 MED ORDER — EPHEDRINE SULFATE 50 MG/ML IJ SOLN
INTRAMUSCULAR | Status: AC
  Filled 2014-02-27: qty 1

## 2014-02-27 MED ORDER — FENTANYL CITRATE 0.05 MG/ML IJ SOLN
INTRAMUSCULAR | Status: DC | PRN
  Administered 2014-02-27 (×4): 50 ug via INTRAVENOUS

## 2014-02-27 MED ORDER — ONDANSETRON HCL 4 MG/2ML IJ SOLN
INTRAMUSCULAR | Status: DC | PRN
  Administered 2014-02-27: 4 mg via INTRAVENOUS

## 2014-02-27 MED ORDER — SUCCINYLCHOLINE CHLORIDE 20 MG/ML IJ SOLN
INTRAMUSCULAR | Status: DC | PRN
  Administered 2014-02-27: 120 mg via INTRAVENOUS

## 2014-02-27 MED ORDER — CIPROFLOXACIN IN D5W 400 MG/200ML IV SOLN
400.0000 mg | Freq: Two times a day (BID) | INTRAVENOUS | Status: AC
  Administered 2014-02-27: 400 mg via INTRAVENOUS
  Filled 2014-02-27: qty 200

## 2014-02-27 MED ORDER — LIDOCAINE HCL (CARDIAC) 20 MG/ML IV SOLN
INTRAVENOUS | Status: AC
  Filled 2014-02-27: qty 5

## 2014-02-27 MED ORDER — MIDAZOLAM HCL 2 MG/2ML IJ SOLN
INTRAMUSCULAR | Status: AC
  Filled 2014-02-27: qty 2

## 2014-02-27 MED ORDER — ONDANSETRON HCL 4 MG/2ML IJ SOLN
4.0000 mg | Freq: Four times a day (QID) | INTRAMUSCULAR | Status: DC | PRN
  Administered 2014-02-27 (×2): 4 mg via INTRAVENOUS

## 2014-02-27 MED ORDER — BUPIVACAINE-EPINEPHRINE (PF) 0.25% -1:200000 IJ SOLN
INTRAMUSCULAR | Status: AC
  Filled 2014-02-27: qty 30

## 2014-02-27 MED ORDER — ONDANSETRON HCL 4 MG/2ML IJ SOLN
INTRAMUSCULAR | Status: AC
  Filled 2014-02-27: qty 2

## 2014-02-27 MED ORDER — ONDANSETRON HCL 4 MG/2ML IJ SOLN
4.0000 mg | Freq: Four times a day (QID) | INTRAMUSCULAR | Status: DC | PRN
  Filled 2014-02-27 (×2): qty 2

## 2014-02-27 MED ORDER — PROPOFOL 10 MG/ML IV BOLUS
INTRAVENOUS | Status: DC | PRN
  Administered 2014-02-27: 150 mg via INTRAVENOUS

## 2014-02-27 MED ORDER — SODIUM CHLORIDE 0.9 % IJ SOLN
INTRAMUSCULAR | Status: AC
  Filled 2014-02-27: qty 10

## 2014-02-27 SURGICAL SUPPLY — 69 items
BLADE EXTENDED COATED 6.5IN (ELECTRODE) ×2 IMPLANT
BLADE HEX COATED 2.75 (ELECTRODE) ×4 IMPLANT
BLADE SURG SZ10 CARB STEEL (BLADE) IMPLANT
CANISTER SUCTION 2500CC (MISCELLANEOUS) IMPLANT
CATH KIT ON Q 7.5IN SLV (PAIN MANAGEMENT) IMPLANT
CATH KIT ON-Q SILVERSOAK 7.5IN (CATHETERS) ×4 IMPLANT
CELLS DAT CNTRL 66122 CELL SVR (MISCELLANEOUS) IMPLANT
COUNTER NEEDLE 20 DBL MAG RED (NEEDLE) IMPLANT
COVER MAYO STAND STRL (DRAPES) ×2 IMPLANT
DECANTER SPIKE VIAL GLASS SM (MISCELLANEOUS) IMPLANT
DRAIN CHANNEL 19F RND (DRAIN) IMPLANT
DRAPE LAPAROSCOPIC ABDOMINAL (DRAPES) ×2 IMPLANT
DRAPE LG THREE QUARTER DISP (DRAPES) ×6 IMPLANT
DRAPE UTILITY XL STRL (DRAPES) ×6 IMPLANT
DRAPE WARM FLUID 44X44 (DRAPE) ×2 IMPLANT
DRSG OPSITE POSTOP 4X10 (GAUZE/BANDAGES/DRESSINGS) ×2 IMPLANT
DRSG OPSITE POSTOP 4X6 (GAUZE/BANDAGES/DRESSINGS) ×2 IMPLANT
DRSG OPSITE POSTOP 4X8 (GAUZE/BANDAGES/DRESSINGS) IMPLANT
DRSG TEGADERM 4X4.75 (GAUZE/BANDAGES/DRESSINGS) ×2 IMPLANT
DRSG TELFA 3X8 NADH (GAUZE/BANDAGES/DRESSINGS) ×2 IMPLANT
ELECT REM PT RETURN 9FT ADLT (ELECTROSURGICAL) ×2
ELECTRODE REM PT RTRN 9FT ADLT (ELECTROSURGICAL) ×1 IMPLANT
ENDOLOOP SUT PDS II  0 18 (SUTURE)
ENDOLOOP SUT PDS II 0 18 (SUTURE) IMPLANT
GAUZE SPONGE 4X4 12PLY STRL (GAUZE/BANDAGES/DRESSINGS) IMPLANT
GLOVE BIO SURGEON STRL SZ 6 (GLOVE) ×4 IMPLANT
GLOVE INDICATOR 6.5 STRL GRN (GLOVE) ×4 IMPLANT
GOWN SPEC L4 XLG W/TWL (GOWN DISPOSABLE) IMPLANT
GOWN STRL REUS W/ TWL XL LVL3 (GOWN DISPOSABLE) ×8 IMPLANT
GOWN STRL REUS W/TWL XL LVL3 (GOWN DISPOSABLE) ×8
KIT BASIN OR (CUSTOM PROCEDURE TRAY) ×4 IMPLANT
LEGGING LITHOTOMY PAIR STRL (DRAPES) ×2 IMPLANT
LUBRICANT JELLY K Y 4OZ (MISCELLANEOUS) ×2 IMPLANT
MANIFOLD NEPTUNE II (INSTRUMENTS) ×2 IMPLANT
PACK GENERAL/GYN (CUSTOM PROCEDURE TRAY) ×4 IMPLANT
PENCIL BUTTON HOLSTER BLD 10FT (ELECTRODE) IMPLANT
RTRCTR WOUND ALEXIS 18CM MED (MISCELLANEOUS)
SCISSORS LAP 5X35 DISP (ENDOMECHANICALS) IMPLANT
SEPRAFILM MEMBRANE 5X6 (MISCELLANEOUS) ×2 IMPLANT
SLEEVE SURGEON STRL (DRAPES) IMPLANT
SPONGE LAP 18X18 X RAY DECT (DISPOSABLE) ×4 IMPLANT
STAPLER CIRC CVD 29MM 37CM (STAPLE) ×2 IMPLANT
STAPLER VISISTAT 35W (STAPLE) ×2 IMPLANT
SUCTION POOLE TIP (SUCTIONS) ×2 IMPLANT
SUT MNCRL AB 4-0 PS2 18 (SUTURE) IMPLANT
SUT NOVA 1 T20/GS 25DT (SUTURE) ×6 IMPLANT
SUT PDS AB 1 CTX 36 (SUTURE) IMPLANT
SUT PDS AB 1 TP1 96 (SUTURE) ×4 IMPLANT
SUT PROLENE 0 SH 30 (SUTURE) ×2 IMPLANT
SUT PROLENE 2 0 KS (SUTURE) IMPLANT
SUT SILK 2 0 (SUTURE)
SUT SILK 2 0 SH CR/8 (SUTURE) IMPLANT
SUT SILK 2-0 18XBRD TIE 12 (SUTURE) IMPLANT
SUT SILK 3 0 (SUTURE)
SUT SILK 3 0 SH CR/8 (SUTURE) IMPLANT
SUT SILK 3-0 18XBRD TIE 12 (SUTURE) IMPLANT
SUT VIC AB 2-0 SH 18 (SUTURE) ×2 IMPLANT
SUT VIC AB 3-0 SH 18 (SUTURE) ×2 IMPLANT
SUT VICRYL 2 0 18  UND BR (SUTURE) ×1
SUT VICRYL 2 0 18 UND BR (SUTURE) ×1 IMPLANT
SUT VICRYL 3 0 BR 18  UND (SUTURE) ×1
SUT VICRYL 3 0 BR 18 UND (SUTURE) ×1 IMPLANT
SYR BULB IRRIGATION 50ML (SYRINGE) IMPLANT
TOWEL OR 17X26 10 PK STRL BLUE (TOWEL DISPOSABLE) ×4 IMPLANT
TOWEL OR NON WOVEN STRL DISP B (DISPOSABLE) ×4 IMPLANT
TRAY FOLEY CATH 14FRSI W/METER (CATHETERS) ×2 IMPLANT
TUBING CONNECTING 10 (TUBING) IMPLANT
TUNNELER SHEATH ON-Q 16GX12 DP (PAIN MANAGEMENT) ×2 IMPLANT
YANKAUER SUCT BULB TIP 10FT TU (MISCELLANEOUS) IMPLANT

## 2014-02-27 NOTE — H&P (View-Only) (Signed)
HISTORY: The patient is a 68 year old female is status post Hartman's procedure for a contained perforated diverticulitis in January 2015.  Her energy level is finally back to normal. She has a colonoscopy scheduled tomorrow. She is eating normally. Her wound is completely healed. She is not having any pain and is on any pain medication. She is quite anxious to get rid of her ostomy.   PERTINENT REVIEW OF SYSTEMS: Otherwise negative x 11.    Filed Vitals:   01/30/14 0938  BP: 124/72  Pulse: 81  Temp: 98.4 F (36.9 C)   Wt Readings from Last 3 Encounters:  01/30/14 182 lb (82.555 kg)  01/26/14 185 lb (83.915 kg)  01/10/14 185 lb (83.915 kg)    EXAM: Head: Normocephalic and atraumatic.  Eyes:  Conjunctivae are normal. Pupils are equal, round, and reactive to light. No scleral icterus.  Neck:  Normal range of motion. Neck supple. No tracheal deviation present. No thyromegaly present.  Resp: No respiratory distress, normal effort. Abd:  Abdomen is soft, non distended and non tender. No masses are palpable.  There is no rebound and no guarding. parastomal hernia is present.   Neurological: Alert and oriented to person, place, and time. Coordination normal.  Skin: Skin is warm and dry. No rash noted. No diaphoretic. No erythema. No pallor.  Psychiatric: Normal mood and affect. Normal behavior. Judgment and thought content normal.      ASSESSMENT AND PLAN:   Diverticulitis of colon with perforation We will plan colostomy takedown with parastomal hernia repair.  She has colonoscopy planned tomorrow.    We discussed risk/benefits of surgery.  I discussed the risk of wound infection, anastomotic leak, bleeding, infection, wound dehiscence, blood clot, pneumonia, or other cardiac or pulmonary complications, risk of needing additional surgery.  She understands and wishes to proceed. I am not going to make her do a full bowel prep next time.    We will do 1/4-1/2 bottle of miralax and  antibiotics.    I reviewed the anatomy and the surgical process.       Milus Height, MD Surgical Oncology, [facility]  [name], MD Janith Lima, MD

## 2014-02-27 NOTE — Transfer of Care (Signed)
Immediate Anesthesia Transfer of Care Note  Patient: Valerie Jackson  Procedure(s) Performed: Procedure(s): COLOSTOMY TAKEDOWN /PARASTOMAL HERNIA REPAIR (N/A)  Patient Location: PACU  Anesthesia Type:General  Level of Consciousness: awake, alert  and oriented  Airway & Oxygen Therapy: Patient Spontanous Breathing and Patient connected to face mask oxygen  Post-op Assessment: Report given to PACU RN and Post -op Vital signs reviewed and stable  Post vital signs: Reviewed and stable  Complications: No apparent anesthesia complications

## 2014-02-27 NOTE — Op Note (Signed)
PRE-OPERATIVE DIAGNOSIS: diverticulitis, parastomal hernia  POST-OPERATIVE DIAGNOSIS:  Same  PROCEDURE:  Procedure(s): Colostomy takedown with resection of colon, repair of parastomal hernia  SURGEON:  Surgeon(s): Stark Klein, MD  ASSISTANT:   Armandina Gemma, MD  ANESTHESIA:   general  DRAINS: none   LOCAL MEDICATIONS USED:  NONE  SPECIMEN:  Source of Specimen:  colostomy and anastamotic rings  DISPOSITION OF SPECIMEN:  PATHOLOGY  COUNTS:  YES  DICTATION: .Dragon Dictation  PLAN OF CARE: Admit to inpatient   PATIENT DISPOSITION:  PACU - hemodynamically stable.   EBL: 150 mL  FINDINGS: very low rectal stump   PROCEDURE:  Patient was identified in the holding area and taken to the operating room where she was placed supine on the operating room table. General anesthesia was induced. A Foley catheter was placed. The patient was then placed in the low lithotomy position. The stoma was closed with a 2-0 Prolene.  Patient's abdomen was prepped and draped in sterile fashion. Timeout was performed according to the surgical safety checklist. When all was correct, we continued. A midline incision was made with a #10 blade taking care to excise it at the prior scar. The subcutaneous tissues were divided with the cautery. The peritoneum was entered at the umbilical location. Fascia was elevated with Kocher clamps and care was taken to take down the intra-abdominal adhesions sharply.  Then the fascia was opened with the cautery. The omentum was elevated superiorly as possible. There was a portion stuck in the pelvis which was dissected free with the cautery and a right angle. A Bookwalter structure was then used for visualization. The small bowel was tucked up into the right upper quadrant. The omentum was tucked up into the left upper quadrant. The uterus was retracted anteriorly.  The sutures from the sigmoid resection were identified and elevated. The assistant went below and placed a  sizer into the rectum. The rectal stump was quite low behind the uterus. This was dissected from the vagina and the posterior uterus for around 2 cm.   At this point, the ostomy was then addressed.  A circumferential incision was made around the ostomy with a #15 blade. The cautery was used to dissect the ostomy from the surrounding tissues. The Allis clamps were used to elevate the ostomy. A tonsil was then used to separate the subcutaneous fat from the ostomy wall. Once this had been done adequately, the ostomy site was pulled into the abdomen. The remainder of the reducible peristomal hernia was taken down to the abdomen. The omentum was examined for there are several areas that were tied off on the omentum. The adnexa was also bleeding, and several suture ligatures were placed on the gonadal vein right at the ovary.    The ostomy site was then resected. The sizers were used to decide that the 29 mm EEA was the appropriate stapler for this patient. The anvil was then placed into the distal descending colon and a 0 Prolene pursestring suture was placed around this. This was tied down taking care to make sure that the colon wall was tied around the anvil. The assistant then went below and he is EEA stapler into the rectum. The vagina was checked to make sure stapler was in the rectum. Spike was then deployed through the rectal wall. This was coupled with the handle. Care was taken to make sure that the colon was not twisted. This tightened down. The vagina was then rechecked to ensure that the stapler was  removed from the vagina and incorporating the vagina wall and to the anterior stapler. The stapler was then fired. Pressure was held for 30 seconds. The stapler was then opened and very gently removed. The bowel cannot clamp was placed on the descending colon and irrigation was placed in the pelvis. The assistant insufflated the rectum with the proctoscope.  There was no sign of leakage. The assistant reviewed  the anastomotic location there is no sign of bleeding. This appear intact. The anastomotic rings were then examined and both were seen to be intact.    The abdomen was reirrigated and the air was allowed to evacuate from the rectum. The Bookwalter was removed as well as all the laparotomy sponges. The drapes, suction, and Bovie were changed and covered according to the colon protocol.    The parastomal hernia was then addressed. The fascial defect was closed primarily in 2 layers of #1 Novafil pops. There is no residual fascial defect. The On-Q catheters were placed on either side of the incision in the pre-peritoneal space. The Seprafilm was placed under the ostomy site. The fascia was then closed using #1 loop PDS suture x2. The wounds were irrigated and inspected for hemostasis. The skin was then closed using a skin stapler. The ostomy site was closed with 2 staples at each corner and then a wick was placed in the center. The wounds were then dressed using the honeycomb dressings. The catheters were advanced into the On-Q tunnelers and secured in place as well. The patient was allowed to hemorrhage from anesthesia. The patient was taken to the PACU in stable condition. Needle, sponge, and instrument counts were correct x2.

## 2014-02-27 NOTE — Interval H&P Note (Signed)
History and Physical Interval Note:  02/27/2014 7:22 AM  Valerie Jackson  has presented today for surgery, with the diagnosis of diverticulitis  The various methods of treatment have been discussed with the patient and family. After consideration of risks, benefits and other options for treatment, the patient has consented to  Procedure(s): COLOSTOMY TAKEDOWN /PARASTOMAL HERNIA REPAIR (N/A) as a surgical intervention .  The patient's history has been reviewed, patient examined, no change in status, stable for surgery.  I have reviewed the patient's chart and labs.  Questions were answered to the patient's satisfaction.     BYERLY,FAERA

## 2014-02-28 ENCOUNTER — Encounter (HOSPITAL_COMMUNITY): Payer: Self-pay | Admitting: General Surgery

## 2014-02-28 LAB — BASIC METABOLIC PANEL
BUN: 15 mg/dL (ref 6–23)
CO2: 23 mEq/L (ref 19–32)
Calcium: 9.6 mg/dL (ref 8.4–10.5)
Chloride: 104 mEq/L (ref 96–112)
Creatinine, Ser: 1.21 mg/dL — ABNORMAL HIGH (ref 0.50–1.10)
GFR calc Af Amer: 52 mL/min — ABNORMAL LOW (ref >90)
GFR calc non Af Amer: 45 mL/min — ABNORMAL LOW (ref >90)
Glucose, Bld: 142 mg/dL — ABNORMAL HIGH (ref 70–99)
Potassium: 4.3 mEq/L (ref 3.7–5.3)
Sodium: 141 mEq/L (ref 137–147)

## 2014-02-28 LAB — PHOSPHORUS: Phosphorus: 2.4 mg/dL (ref 2.3–4.6)

## 2014-02-28 LAB — MAGNESIUM: Magnesium: 1.6 mg/dL (ref 1.5–2.5)

## 2014-02-28 LAB — CBC
HCT: 30 % — ABNORMAL LOW (ref 36.0–46.0)
Hemoglobin: 10.1 g/dL — ABNORMAL LOW (ref 12.0–15.0)
MCH: 29.3 pg (ref 26.0–34.0)
MCHC: 33.7 g/dL (ref 30.0–36.0)
MCV: 87 fL (ref 78.0–100.0)
Platelets: 196 10*3/uL (ref 150–400)
RBC: 3.45 MIL/uL — ABNORMAL LOW (ref 3.87–5.11)
RDW: 13.3 % (ref 11.5–15.5)
WBC: 15.3 10*3/uL — ABNORMAL HIGH (ref 4.0–10.5)

## 2014-02-28 MED ORDER — ACETAMINOPHEN 10 MG/ML IV SOLN
1000.0000 mg | Freq: Four times a day (QID) | INTRAVENOUS | Status: AC
  Administered 2014-02-28 – 2014-03-01 (×4): 1000 mg via INTRAVENOUS
  Filled 2014-02-28 (×5): qty 100

## 2014-02-28 MED ORDER — DIPHENHYDRAMINE HCL 12.5 MG/5ML PO ELIX: 12.5000 mg | ORAL_SOLUTION | Freq: Four times a day (QID) | ORAL | Status: DC | PRN

## 2014-02-28 MED ORDER — LORAZEPAM 0.5 MG PO TABS: 0.5000 mg | ORAL_TABLET | Freq: Three times a day (TID) | ORAL | Status: DC | PRN

## 2014-02-28 MED ORDER — NALOXONE HCL 0.4 MG/ML IJ SOLN: 0.4000 mg | INTRAMUSCULAR | Status: DC | PRN

## 2014-02-28 MED ORDER — ONDANSETRON HCL 4 MG/2ML IJ SOLN: 4.0000 mg | Freq: Four times a day (QID) | INTRAMUSCULAR | Status: DC | PRN

## 2014-02-28 MED ORDER — SODIUM CHLORIDE 0.9 % IJ SOLN: 9.0000 mL | INTRAMUSCULAR | Status: DC | PRN

## 2014-02-28 MED ORDER — MORPHINE SULFATE (PF) 1 MG/ML IV SOLN: INTRAVENOUS | Status: DC

## 2014-02-28 MED ORDER — KETOROLAC TROMETHAMINE 15 MG/ML IJ SOLN
15.0000 mg | Freq: Four times a day (QID) | INTRAMUSCULAR | Status: AC
  Administered 2014-02-28 – 2014-03-01 (×4): 15 mg via INTRAVENOUS
  Filled 2014-02-28 (×4): qty 1

## 2014-02-28 MED ORDER — DIPHENHYDRAMINE HCL 50 MG/ML IJ SOLN: 12.5000 mg | Freq: Four times a day (QID) | INTRAMUSCULAR | Status: DC | PRN

## 2014-02-28 MED ORDER — LORAZEPAM 2 MG/ML IJ SOLN
0.5000 mg | Freq: Three times a day (TID) | INTRAMUSCULAR | Status: DC | PRN
  Administered 2014-02-28: 1 mg via INTRAVENOUS
  Filled 2014-02-28: qty 1

## 2014-02-28 NOTE — Care Management Note (Addendum)
    Page 1 of 1   03/05/2014     11:28:27 AM CARE MANAGEMENT NOTE 03/05/2014  Patient:  Valerie Jackson, Valerie Jackson   Account Number:  [id number]  Date Initiated:  02/28/2014  Documentation initiated by:  Dessa Phi  Subjective/Objective Assessment:   68 Y/O F ADMITTED W/DIVERTICULITIS.     Action/Plan:   FROM HOME.   Anticipated DC Date:  03/05/2014   Anticipated DC Plan:  [place]  CM consult      Choice offered to / List presented to:             Status of service:  Completed, signed off Medicare Important Message given?  YES (If response is "NO", the following Medicare IM given date fields will be blank) Date Medicare IM given:  03/05/2014 Date Additional Medicare IM given:    Discharge Disposition:  HOME/SELF CARE  Per UR Regulation:  Reviewed for med. necessity/level of care/duration of stay  If discussed at [place] of Stay Meetings, dates discussed:    Comments:  02/28/14 KATHY MAHABIR RN,BSN NCM [phone] POD#1 COLOSTOMY TAKEDOWN/HERNIA REPAIR.NO ANTICIPATED D/C NEEDS.

## 2014-02-28 NOTE — Anesthesia Postprocedure Evaluation (Signed)
  Anesthesia Post-op Note  Patient: Valerie Jackson  Procedure(s) Performed: Procedure(s) (LRB): COLOSTOMY TAKEDOWN /PARASTOMAL HERNIA REPAIR (N/A)  Patient Location: PACU  Anesthesia Type: General  Level of Consciousness: awake and alert   Airway and Oxygen Therapy: Patient Spontanous Breathing  Post-op Pain: mild  Post-op Assessment: Post-op Vital signs reviewed, Patient's Cardiovascular Status Stable, Respiratory Function Stable, Patent Airway and No signs of Nausea or vomiting  Last Vitals:  Filed Vitals:   02/28/14 1012  BP: 118/65  Pulse: 72  Temp: 36.3 C  Resp: 22    Post-op Vital Signs: stable   Complications: No apparent anesthesia complications

## 2014-02-28 NOTE — Progress Notes (Signed)
1 Day Post-Op  Subjective: Had some issues with PCA.  Felt like either too sedated or too much in pain.    Objective: Vital signs in last 24 hours: Temp:  [97.4 F (36.3 C)-97.6 F (36.4 C)] 97.5 F (36.4 C) (06/17 0028) Pulse Rate:  [77-93] 77 (06/17 0527) Resp:  [8-26] 18 (06/17 0644) BP: (96-125)/(55-89) 125/75 mmHg (06/17 0527) SpO2:  [93 %-100 %] 96 % (06/17 0644) Weight:  [192 lb 7.4 oz (87.3 kg)] 192 lb 7.4 oz (87.3 kg) (06/16 1159)    Intake/Output from previous day: 06/16 0701 - 06/17 0700 In: 3319.5 [I.V.:3019.5; IV Piggyback:300] Out: 5672 [Urine:1625; Blood:150] Intake/Output this shift:    General appearance: alert, cooperative and mild distress Resp: breathing comfortably GI: soft, bloated, approp tender  Lab Results:   Recent Labs  02/27/14 1237 02/28/14 0345  WBC 16.4* 15.3*  HGB 10.8* 10.1*  HCT 31.5* 30.0*  PLT 210 196   BMET  Recent Labs  02/27/14 1237 02/28/14 0345  NA  --  141  K  --  4.3  CL  --  104  CO2  --  23  GLUCOSE  --  142*  BUN  --  15  CREATININE 1.18* 1.21*  CALCIUM  --  9.6   PT/INR No results found for this basename: LABPROT, INR,  in the last 72 hours ABG No results found for this basename: PHART, PCO2, PO2, HCO3,  in the last 72 hours  Studies/Results: No results found.  Anti-infectives: Anti-infectives   Start     Dose/Rate Route Frequency Ordered Stop   02/27/14 1800  ciprofloxacin (CIPRO) IVPB 400 mg     400 mg 200 mL/hr over 60 Minutes Intravenous Every 12 hours 02/27/14 1159 02/27/14 1910   02/27/14 1800  metroNIDAZOLE (FLAGYL) IVPB 500 mg     500 mg 100 mL/hr over 60 Minutes Intravenous Every 8 hours 02/27/14 1159 02/28/14 0304   02/27/14 0532  ceFAZolin (ANCEF) IVPB 2 g/50 mL premix     2 g 100 mL/hr over 30 Minutes Intravenous On call to O.R. 02/27/14 0532 02/27/14 0740      Assessment/Plan: s/p Procedure(s): COLOSTOMY TAKEDOWN /PARASTOMAL HERNIA REPAIR (N/A) Continue foley due to urinary  output monitoring Add iv tylenol and toradol. Change to reduced dose pca Ambulate/incentive spirometer Anticipate d/c foley tomorrow  LOS: 1 day    [facility] 02/28/2014

## 2014-03-01 ENCOUNTER — Ambulatory Visit: Payer: Medicare Other | Admitting: Internal Medicine

## 2014-03-01 LAB — BASIC METABOLIC PANEL
BUN: 18 mg/dL (ref 6–23)
CO2: 24 mEq/L (ref 19–32)
Calcium: 9.3 mg/dL (ref 8.4–10.5)
Chloride: 108 mEq/L (ref 96–112)
Creatinine, Ser: 1.4 mg/dL — ABNORMAL HIGH (ref 0.50–1.10)
GFR calc Af Amer: 43 mL/min — ABNORMAL LOW (ref >90)
GFR calc non Af Amer: 37 mL/min — ABNORMAL LOW (ref >90)
Glucose, Bld: 120 mg/dL — ABNORMAL HIGH (ref 70–99)
Potassium: 4.3 mEq/L (ref 3.7–5.3)
Sodium: 142 mEq/L (ref 137–147)

## 2014-03-01 LAB — CBC
HCT: 24.2 % — ABNORMAL LOW (ref 36.0–46.0)
Hemoglobin: 8.2 g/dL — ABNORMAL LOW (ref 12.0–15.0)
MCH: 29.6 pg (ref 26.0–34.0)
MCHC: 33.9 g/dL (ref 30.0–36.0)
MCV: 87.4 fL (ref 78.0–100.0)
Platelets: 159 10*3/uL (ref 150–400)
RBC: 2.77 MIL/uL — ABNORMAL LOW (ref 3.87–5.11)
RDW: 13.6 % (ref 11.5–15.5)
WBC: 14.7 10*3/uL — ABNORMAL HIGH (ref 4.0–10.5)

## 2014-03-01 MED ORDER — TRAMADOL HCL 50 MG PO TABS
50.0000 mg | ORAL_TABLET | Freq: Four times a day (QID) | ORAL | Status: DC | PRN
  Administered 2014-03-01: 100 mg via ORAL
  Administered 2014-03-01 (×2): 50 mg via ORAL
  Administered 2014-03-02 – 2014-03-03 (×2): 100 mg via ORAL
  Filled 2014-03-01 (×2): qty 2
  Filled 2014-03-01 (×2): qty 1
  Filled 2014-03-01: qty 2

## 2014-03-01 NOTE — Progress Notes (Signed)
RN attempted to remove foley twice this shift. The first time the patient refused to have the foley removed and the second time the patient requested to speak with the MD about keeping the foley for another day.

## 2014-03-01 NOTE — Progress Notes (Signed)
Patient ID: Valerie Jackson, female   DOB: 1946/07/09, 68 y.o.   MRN: [id number] 2 Days Post-Op  Subjective: pca d/c'd.  No n/v.  No flatus.  Tolerating some clears.    Objective: Vital signs in last 24 hours: Temp:  [97.4 F (36.3 C)-98 F (36.7 C)] 97.9 F (36.6 C) (06/18 0434) Pulse Rate:  [72-89] 78 (06/18 0434) Resp:  [16-22] 18 (06/18 0434) BP: (118-124)/(64-73) 122/72 mmHg (06/18 0434) SpO2:  [98 %-100 %] 98 % (06/18 0434) Last BM Date:  (prior to admission- per patient. )  Intake/Output from previous day: 06/17 0701 - 06/18 0700 In: 2783.3 [I.V.:2383.3; IV Piggyback:400] Out: 2600 [Urine:2600] Intake/Output this shift:    General appearance: alert, cooperative and mild distress Resp: breathing comfortably GI: soft, bloated but much softer abdomen, approp tender  Lab Results:   Recent Labs  02/28/14 0345 03/01/14 0419  WBC 15.3* 14.7*  HGB 10.1* 8.2*  HCT 30.0* 24.2*  PLT 196 159   BMET  Recent Labs  02/28/14 0345 03/01/14 0419  NA 141 142  K 4.3 4.3  CL 104 108  CO2 23 24  GLUCOSE 142* 120*  BUN 15 18  CREATININE 1.21* 1.40*  CALCIUM 9.6 9.3   PT/INR No results found for this basename: LABPROT, INR,  in the last 72 hours ABG No results found for this basename: PHART, PCO2, PO2, HCO3,  in the last 72 hours  Studies/Results: No results found.  Anti-infectives: Anti-infectives   Start     Dose/Rate Route Frequency Ordered Stop   02/27/14 1800  ciprofloxacin (CIPRO) IVPB 400 mg     400 mg 200 mL/hr over 60 Minutes Intravenous Every 12 hours 02/27/14 1159 02/27/14 1910   02/27/14 1800  metroNIDAZOLE (FLAGYL) IVPB 500 mg     500 mg 100 mL/hr over 60 Minutes Intravenous Every 8 hours 02/27/14 1159 02/28/14 0304   02/27/14 0532  ceFAZolin (ANCEF) IVPB 2 g/50 mL premix     2 g 100 mL/hr over 30 Minutes Intravenous On call to O.R. 02/27/14 0532 02/27/14 0740      Assessment/Plan: s/p Procedure(s): COLOSTOMY TAKEDOWN /PARASTOMAL HERNIA REPAIR  (N/A) Continue foley due to diuresis D/c toradol since cr had slight bump.  Try tramadol.  Ambulate/incentive spirometer Advance diet to fulls.     LOS: 2 days    [facility] 03/01/2014

## 2014-03-02 LAB — CBC
HCT: 26 % — ABNORMAL LOW (ref 36.0–46.0)
Hemoglobin: 8.5 g/dL — ABNORMAL LOW (ref 12.0–15.0)
MCH: 28.7 pg (ref 26.0–34.0)
MCHC: 32.7 g/dL (ref 30.0–36.0)
MCV: 87.8 fL (ref 78.0–100.0)
Platelets: 154 10*3/uL (ref 150–400)
RBC: 2.96 MIL/uL — ABNORMAL LOW (ref 3.87–5.11)
RDW: 13.6 % (ref 11.5–15.5)
WBC: 8.7 10*3/uL (ref 4.0–10.5)

## 2014-03-02 LAB — BASIC METABOLIC PANEL
BUN: 11 mg/dL (ref 6–23)
CO2: 25 mEq/L (ref 19–32)
Calcium: 9 mg/dL (ref 8.4–10.5)
Chloride: 104 mEq/L (ref 96–112)
Creatinine, Ser: 1.18 mg/dL — ABNORMAL HIGH (ref 0.50–1.10)
GFR calc Af Amer: 53 mL/min — ABNORMAL LOW (ref >90)
GFR calc non Af Amer: 46 mL/min — ABNORMAL LOW (ref >90)
Glucose, Bld: 99 mg/dL (ref 70–99)
Potassium: 3.7 mEq/L (ref 3.7–5.3)
Sodium: 140 mEq/L (ref 137–147)

## 2014-03-02 NOTE — Progress Notes (Signed)
Patient ID: [name], female   DOB: 05-14-1946, 68 y.o.   MRN: [id number] Patient ID: TAHESHA SKEET, female   DOB: 1946-05-01, 68 y.o.   MRN: [id number] 3 Days Post-Op  Subjective: Small amt flatus.  No BM.  Tolerating full liquids.    Objective: Vital signs in last 24 hours: Temp:  [97.8 F (36.6 C)-98.4 F (36.9 C)] 97.9 F (36.6 C) (06/19 0537) Pulse Rate:  [79-91] 89 (06/19 0537) Resp:  [16-18] 16 (06/19 0537) BP: (122-138)/(62-81) 128/77 mmHg (06/19 0537) SpO2:  [90 %-100 %] 90 % (06/19 0829) Last BM Date:  (prior to surgery. )  Intake/Output from previous day: 06/18 0701 - 06/19 0700 In: 1956.7 [P.O.:840; I.V.:1116.7] Out: 3950 [Urine:3950] Intake/Output this shift: Total I/O In: -  Out: 300 [Urine:300]  General appearance: alert, cooperative and mild distress Resp: breathing comfortably GI: soft, bloated but much softer abdomen, approp tender.  Dressing removed with OnQ.  No evidence of infection.    Lab Results:   Recent Labs  03/01/14 0419 03/02/14 0351  WBC 14.7* 8.7  HGB 8.2* 8.5*  HCT 24.2* 26.0*  PLT 159 154   BMET  Recent Labs  03/01/14 0419 03/02/14 0351  NA 142 140  K 4.3 3.7  CL 108 104  CO2 24 25  GLUCOSE 120* 99  BUN 18 11  CREATININE 1.40* 1.18*  CALCIUM 9.3 9.0   PT/INR No results found for this basename: LABPROT, INR,  in the last 72 hours ABG No results found for this basename: PHART, PCO2, PO2, HCO3,  in the last 72 hours  Studies/Results: No results found.  Anti-infectives: Anti-infectives   Start     Dose/Rate Route Frequency Ordered Stop   02/27/14 1800  ciprofloxacin (CIPRO) IVPB 400 mg     400 mg 200 mL/hr over 60 Minutes Intravenous Every 12 hours 02/27/14 1159 02/27/14 1910   02/27/14 1800  metroNIDAZOLE (FLAGYL) IVPB 500 mg     500 mg 100 mL/hr over 60 Minutes Intravenous Every 8 hours 02/27/14 1159 02/28/14 0304   02/27/14 0532  ceFAZolin (ANCEF) IVPB 2 g/50 mL premix     2 g 100 mL/hr over 30 Minutes  Intravenous On call to O.R. 02/27/14 0532 02/27/14 0740      Assessment/Plan: s/p Procedure(s): COLOSTOMY TAKEDOWN /PARASTOMAL HERNIA REPAIR (N/A) Foley out, pt has voided.   Cr back to normal.  Saline lock IVF.  Ambulate/incentive spirometer Leave diet at fulls since sl bloated. Await BM or more flatus for reg diet.       LOS: 3 days    [facility] 03/02/2014

## 2014-03-03 ENCOUNTER — Encounter (HOSPITAL_COMMUNITY): Payer: Self-pay | Admitting: Surgery

## 2014-03-03 DIAGNOSIS — K435 Parastomal hernia without obstruction or  gangrene: Secondary | ICD-10-CM | POA: Diagnosis present

## 2014-03-03 DIAGNOSIS — N182 Chronic kidney disease, stage 2 (mild): Secondary | ICD-10-CM

## 2014-03-03 LAB — BASIC METABOLIC PANEL
BUN: 8 mg/dL (ref 6–23)
CO2: 27 mEq/L (ref 19–32)
Calcium: 9.2 mg/dL (ref 8.4–10.5)
Chloride: 102 mEq/L (ref 96–112)
Creatinine, Ser: 1.21 mg/dL — ABNORMAL HIGH (ref 0.50–1.10)
GFR calc Af Amer: 52 mL/min — ABNORMAL LOW (ref >90)
GFR calc non Af Amer: 45 mL/min — ABNORMAL LOW (ref >90)
Glucose, Bld: 94 mg/dL (ref 70–99)
Potassium: 3.7 mEq/L (ref 3.7–5.3)
Sodium: 142 mEq/L (ref 137–147)

## 2014-03-03 LAB — CBC
HCT: 26.4 % — ABNORMAL LOW (ref 36.0–46.0)
Hemoglobin: 8.8 g/dL — ABNORMAL LOW (ref 12.0–15.0)
MCH: 29.1 pg (ref 26.0–34.0)
MCHC: 33.3 g/dL (ref 30.0–36.0)
MCV: 87.4 fL (ref 78.0–100.0)
Platelets: 178 10*3/uL (ref 150–400)
RBC: 3.02 MIL/uL — ABNORMAL LOW (ref 3.87–5.11)
RDW: 13 % (ref 11.5–15.5)
WBC: 8.2 10*3/uL (ref 4.0–10.5)

## 2014-03-03 MED ORDER — MECLIZINE HCL 12.5 MG PO TABS
12.5000 mg | ORAL_TABLET | Freq: Three times a day (TID) | ORAL | Status: DC | PRN
Start: 2014-03-03 — End: 2014-03-05
  Administered 2014-03-03: 12.5 mg via ORAL
  Filled 2014-03-03 (×2): qty 1

## 2014-03-03 MED ORDER — SACCHAROMYCES BOULARDII 250 MG PO CAPS
250.0000 mg | ORAL_CAPSULE | Freq: Two times a day (BID) | ORAL | Status: DC
  Administered 2014-03-03 – 2014-03-05 (×4): 250 mg via ORAL
  Filled 2014-03-03 (×6): qty 1

## 2014-03-03 MED ORDER — ALUM & MAG HYDROXIDE-SIMETH 200-200-20 MG/5ML PO SUSP: 30.0000 mL | Freq: Four times a day (QID) | ORAL | Status: DC | PRN

## 2014-03-03 MED ORDER — LACTATED RINGERS IV BOLUS (SEPSIS): 1000.0000 mL | Freq: Three times a day (TID) | INTRAVENOUS | Status: AC | PRN

## 2014-03-03 MED ORDER — ENSURE COMPLETE PO LIQD: 237.0000 mL | Freq: Three times a day (TID) | ORAL | Status: DC

## 2014-03-03 MED ORDER — LIP MEDEX EX OINT
1.0000 "application " | TOPICAL_OINTMENT | Freq: Two times a day (BID) | CUTANEOUS | Status: DC
  Administered 2014-03-03 – 2014-03-05 (×5): 1 via TOPICAL
  Filled 2014-03-03: qty 7

## 2014-03-03 MED ORDER — ACETAMINOPHEN 500 MG PO TABS
1000.0000 mg | ORAL_TABLET | Freq: Three times a day (TID) | ORAL | Status: DC
  Administered 2014-03-03 (×3): 1000 mg via ORAL
  Filled 2014-03-03 (×6): qty 2

## 2014-03-03 MED ORDER — OXYCODONE HCL 5 MG PO TABS
5.0000 mg | ORAL_TABLET | ORAL | Status: DC | PRN
  Administered 2014-03-04: 5 mg via ORAL
  Filled 2014-03-03: qty 1

## 2014-03-03 MED ORDER — MAGIC MOUTHWASH
15.0000 mL | Freq: Four times a day (QID) | ORAL | Status: DC | PRN
Start: 2014-03-03 — End: 2014-03-05
  Filled 2014-03-03: qty 15

## 2014-03-03 MED ORDER — TIOTROPIUM BROMIDE MONOHYDRATE 18 MCG IN CAPS
18.0000 ug | ORAL_CAPSULE | Freq: Every day | RESPIRATORY_TRACT | Status: DC
  Filled 2014-03-03: qty 5

## 2014-03-03 MED ORDER — MORPHINE SULFATE 2 MG/ML IJ SOLN: 2.0000 mg | INTRAMUSCULAR | Status: DC | PRN

## 2014-03-03 MED ORDER — MENTHOL 3 MG MT LOZG
1.0000 | LOZENGE | OROMUCOSAL | Status: DC | PRN
Start: 2014-03-03 — End: 2014-03-05

## 2014-03-03 MED ORDER — PHENOL 1.4 % MT LIQD: 2.0000 | OROMUCOSAL | Status: DC | PRN

## 2014-03-03 NOTE — Plan of Care (Signed)
Problem: Discharge Progression Outcomes Goal: Barriers To Progression Addressed/Resolved Outcome: Progressing Patient is gradually ambulating more

## 2014-03-03 NOTE — Progress Notes (Signed)
[place], MD, [address] [place], [place] [address] Phone: [phone] FAX: [place] [phone] [birth year]  CARE TEAM:  PCP: [name], MD  Outpatient Care Team: Patient Care Team: Janith Lima, MD as PCP - General (Internal Medicine)  Inpatient Treatment Team: Treatment Team: Attending Imajean Mcdermid: Stark Klein, MD; Registered Nurse: Erma Heritage, RN; Registered Nurse: Charlyne Mom, RN; Technician: Coralie Carpen, NT; Registered Nurse: Val Riles, RN; Student Nurse: Lupita Shutter, Student-RN; Technician: Darlyn Chamber, NT; Technician: Harlen Labs, NT; Registered Nurse: Angus Palms, RN   Subjective:  Sore Tol fulls  Objective:  Vital signs:  Filed Vitals:   03/02/14 1331 03/02/14 2028 03/03/14 0443 03/03/14 0914  BP: 134/74 140/87 123/79   Pulse: 82 83 76   Temp: 98.3 F (36.8 C) 98.5 F (36.9 C) 98.2 F (36.8 C)   TempSrc: Oral Oral Oral   Resp: 18 16 16    Height:      Weight:      SpO2: 97% 97% 94% 94%    Last BM Date: 03/02/14 (pt states "a hershey drop")  Intake/Output   Yesterday:  06/19 0701 - 06/20 0700 In: 720 [P.O.:720] Out: 2400 [Urine:2400] This shift:     Bowel function:  Flatus: Mild  BM: no  Drain: n/a  Physical Exam:  General: Pt awake/alert/oriented x4 in no acute distress Eyes: PERRL, normal EOM.  Sclera clear.  No icterus Neuro: CN II-XII intact w/o focal sensory/motor deficits. Lymph: No head/neck/groin lymphadenopathy Psych:  No delerium/psychosis/paranoia HENT: Normocephalic, Mucus membranes moist.  No thrush Neck: Supple, No tracheal deviation Chest: No chest wall pain w good excursion CV:  Pulses intact.  Regular rhythm MS: Normal AROM mjr joints.  No obvious deformity Abdomen: Soft.  Nondistended.  Mildly tender at incisions only.  Midline closed.  Old colostomy healing w clean  central wound.  No evidence of peritonitis.  No incarcerated hernias. Ext:  SCDs BLE.  No mjr edema.  No cyanosis Skin: No petechiae / purpura   Problem List:   Principal Problem:   Parastomal hernia s/p colostomy takedown & repair 02/27/2014 Active Problems:   Hypertension   Obesity (BMI 30-39.9)   COPD (chronic obstructive pulmonary disease) with emphysema   Diverticulitis   CKD (chronic kidney disease) stage 2, GFR 60-89 ml/min   Assessment  Valerie Jackson  68 y.o. female  4 Days Post-Op  Procedure(s): COLOSTOMY TAKEDOWN /PARASTOMAL HERNIA REPAIR  Improving  Plan:  -improve pain control.  Tylenol/ice/heat RTC.  Inc from tramadol to oxycodone -adv to solids -CKD - Cr OK w diuretic -HTN controlled -synthroid  -VTE prophylaxis- SCDs, etc -mobilize as tolerated to help recovery  D/C patient from hospital when patient meets criteria (anticipate in 1-2 day(s)):  Tolerating oral intake well Ambulating in walkways Adequate pain control without IV medications Urinating  Having flatus   [name], M.D., F.A.C.S. Gastrointestinal and Minimally Invasive Surgery [facility] [address] [place], [place] [address] [phone] Main / Paging   03/03/2014   Results:   Labs: Results for orders placed during the hospital encounter of 02/27/14 (from the past 48 hour(s))  CBC     Status: Abnormal   Collection Time    03/02/14  3:51 AM      Result Value Ref Range   WBC 8.7  4.0 - 10.5 K/uL  RBC 2.96 (*) 3.87 - 5.11 MIL/uL   Hemoglobin 8.5 (*) 12.0 - 15.0 g/dL   HCT 26.0 (*) 36.0 - 46.0 %   MCV 87.8  78.0 - 100.0 fL   MCH 28.7  26.0 - 34.0 pg   MCHC 32.7  30.0 - 36.0 g/dL   RDW 13.6  11.5 - 15.5 %   Platelets 154  150 - 400 K/uL  BASIC METABOLIC PANEL     Status: Abnormal   Collection Time    03/02/14  3:51 AM      Result Value Ref Range   Sodium 140  137 - 147 mEq/L   Potassium 3.7  3.7 - 5.3 mEq/L   Chloride 104   96 - 112 mEq/L   CO2 25  19 - 32 mEq/L   Glucose, Bld 99  70 - 99 mg/dL   BUN 11  6 - 23 mg/dL   Creatinine, Ser 1.18 (*) 0.50 - 1.10 mg/dL   Calcium 9.0  8.4 - 10.5 mg/dL   GFR calc non Af Amer 46 (*) >90 mL/min   GFR calc Af Amer 53 (*) >90 mL/min   Comment: (NOTE)     The eGFR has been calculated using the CKD EPI equation.     This calculation has not been validated in all clinical situations.     eGFR's persistently <90 mL/min signify possible Chronic Kidney     Disease.  CBC     Status: Abnormal   Collection Time    03/03/14  3:59 AM      Result Value Ref Range   WBC 8.2  4.0 - 10.5 K/uL   RBC 3.02 (*) 3.87 - 5.11 MIL/uL   Hemoglobin 8.8 (*) 12.0 - 15.0 g/dL   HCT 26.4 (*) 36.0 - 46.0 %   MCV 87.4  78.0 - 100.0 fL   MCH 29.1  26.0 - 34.0 pg   MCHC 33.3  30.0 - 36.0 g/dL   RDW 13.0  11.5 - 15.5 %   Platelets 178  150 - 400 K/uL  BASIC METABOLIC PANEL     Status: Abnormal   Collection Time    03/03/14  3:59 AM      Result Value Ref Range   Sodium 142  137 - 147 mEq/L   Potassium 3.7  3.7 - 5.3 mEq/L   Chloride 102  96 - 112 mEq/L   CO2 27  19 - 32 mEq/L   Glucose, Bld 94  70 - 99 mg/dL   BUN 8  6 - 23 mg/dL   Creatinine, Ser 1.21 (*) 0.50 - 1.10 mg/dL   Calcium 9.2  8.4 - 10.5 mg/dL   GFR calc non Af Amer 45 (*) >90 mL/min   GFR calc Af Amer 52 (*) >90 mL/min   Comment: (NOTE)     The eGFR has been calculated using the CKD EPI equation.     This calculation has not been validated in all clinical situations.     eGFR's persistently <90 mL/min signify possible Chronic Kidney     Disease.    Imaging / Studies: No results found.  Medications / Allergies: per chart  Antibiotics: Anti-infectives   Start     Dose/Rate Route Frequency Ordered Stop   02/27/14 1800  ciprofloxacin (CIPRO) IVPB 400 mg     400 mg 200 mL/hr over 60 Minutes Intravenous Every 12 hours 02/27/14 1159 02/27/14 1910   02/27/14 1800  metroNIDAZOLE (FLAGYL) IVPB 500 mg  500 mg 100 mL/hr  over 60 Minutes Intravenous Every 8 hours 02/27/14 1159 02/28/14 0304   02/27/14 0532  ceFAZolin (ANCEF) IVPB 2 g/50 mL premix     2 g 100 mL/hr over 30 Minutes Intravenous On call to O.R. 02/27/14 0532 02/27/14 0740       Note: This dictation was prepared with Dragon/digital dictation along with Smartphrase technology. Any transcriptional errors that result from this process are unintentional.

## 2014-03-03 NOTE — Progress Notes (Signed)
Pt feeling much better this afternoon, no dizziness, has ambulated in hallway and tolerating well.  Diet tolerated, still passing lots of flatus and very small BMs

## 2014-03-03 NOTE — Progress Notes (Signed)
Meclizine very helpful per pt, still not well enough to ambulate, states she will let staff know when she feels able.  Up in room to bathroom at least

## 2014-03-03 NOTE — Progress Notes (Signed)
Pt c/o dizziness, started after ultram was given and she was up bathing, laying down didn't relieve it.  Bp139/83,hr77, sats 97%, will order meclizine from pharm and try that

## 2014-03-03 NOTE — Progress Notes (Signed)
Patient c/o pain around the saline lock.  The patient was very anxious to have the saline lock removed.  The RN removed the saline lock at the patient's request.  The RN was going to restart a peripheral IV site but the patient  stated that she wanted to talk with the doctor in the   morning about keeping the PIV out of her arm.  Will continue to monitor the patient.

## 2014-03-04 LAB — BASIC METABOLIC PANEL
BUN: 11 mg/dL (ref 6–23)
CO2: 29 mEq/L (ref 19–32)
Calcium: 9.4 mg/dL (ref 8.4–10.5)
Chloride: 99 mEq/L (ref 96–112)
Creatinine, Ser: 1.36 mg/dL — ABNORMAL HIGH (ref 0.50–1.10)
GFR calc Af Amer: 45 mL/min — ABNORMAL LOW (ref >90)
GFR calc non Af Amer: 39 mL/min — ABNORMAL LOW (ref >90)
Glucose, Bld: 104 mg/dL — ABNORMAL HIGH (ref 70–99)
Potassium: 3.5 mEq/L — ABNORMAL LOW (ref 3.7–5.3)
Sodium: 141 mEq/L (ref 137–147)

## 2014-03-04 LAB — CBC
HCT: 29.1 % — ABNORMAL LOW (ref 36.0–46.0)
Hemoglobin: 9.7 g/dL — ABNORMAL LOW (ref 12.0–15.0)
MCH: 28.7 pg (ref 26.0–34.0)
MCHC: 33.3 g/dL (ref 30.0–36.0)
MCV: 86.1 fL (ref 78.0–100.0)
Platelets: 204 10*3/uL (ref 150–400)
RBC: 3.38 MIL/uL — ABNORMAL LOW (ref 3.87–5.11)
RDW: 13.1 % (ref 11.5–15.5)
WBC: 8.1 10*3/uL (ref 4.0–10.5)

## 2014-03-04 MED ORDER — OXYCODONE HCL 5 MG PO TABS
10.0000 mg | ORAL_TABLET | ORAL | Status: DC | PRN
  Administered 2014-03-04 – 2014-03-05 (×3): 10 mg via ORAL
  Filled 2014-03-04 (×3): qty 2

## 2014-03-04 MED ORDER — ACETAMINOPHEN 500 MG PO TABS
1000.0000 mg | ORAL_TABLET | Freq: Four times a day (QID) | ORAL | Status: DC
  Administered 2014-03-04 (×2): 1000 mg via ORAL
  Filled 2014-03-04 (×4): qty 2

## 2014-03-04 NOTE — Progress Notes (Signed)
[place], MD, [address] [place], [place] [address] Phone: [phone] FAX: [place] [phone] [birth year]  CARE TEAM:  PCP: [name], MD  Outpatient Care Team: Patient Care Team: Janith Lima, MD as PCP - General (Internal Medicine)  Inpatient Treatment Team: Treatment Team: Attending Kiondra Caicedo: Stark Klein, MD; Registered Nurse: Charlyne Mom, RN; Registered Nurse: Val Riles, RN; Student Nurse: Lupita Shutter, Student-RN; Technician: Darlyn Chamber, NT; Technician: Harlen Labs, NT; Registered Nurse: Angus Palms, RN; Registered Nurse: Patton Salles, RN   Subjective:  Sore - oversedated w tramadol Tol some solids Staying in bed mostly   Objective:  Vital signs:  Filed Vitals:   03/03/14 0914 03/03/14 1448 03/03/14 2218 03/04/14 0452  BP:  122/79 118/71 126/85  Pulse:  79 75 88  Temp:  98.1 F (36.7 C) 98.2 F (36.8 C) 97.5 F (36.4 C)  TempSrc:  Oral Oral Oral  Resp:  18 20 20   Height:      Weight:      SpO2: 94% 94% 95% 96%    Last BM Date: 03/03/14 ("wet gas" per pt)  Intake/Output   Yesterday:  06/20 0701 - 06/21 0700 In: 720 [P.O.:720] Out: 250 [Urine:250] This shift:     Bowel function:  Flatus: LARGE  BM: Yes, loose  Drain: n/a  Physical Exam:  General: Pt awake/alert/oriented x4 in no acute distress Eyes: PERRL, normal EOM.  Sclera clear.  No icterus Neuro: CN II-XII intact w/o focal sensory/motor deficits. Lymph: No head/neck/groin lymphadenopathy Psych:  No delerium/psychosis/paranoia HENT: Normocephalic, Mucus membranes moist.  No thrush Neck: Supple, No tracheal deviation Chest: No chest wall pain w good excursion CV:  Pulses intact.  Regular rhythm MS: Normal AROM mjr joints.  No obvious deformity Abdomen: Soft.  Nondistended.  Mildly tender at incisions only.  Midline closed.  Old colostomy  healing w clean central wound.  No evidence of peritonitis.  No incarcerated hernias. Ext:  SCDs BLE.  No mjr edema.  No cyanosis Skin: No petechiae / purpura   Problem List:   Principal Problem:   Parastomal hernia s/p colostomy takedown & repair 02/27/2014 Active Problems:   Hypertension   Obesity (BMI 30-39.9)   COPD (chronic obstructive pulmonary disease) with emphysema   Diverticulitis   CKD (chronic kidney disease) stage 2, GFR 60-89 ml/min   Assessment  Deshonda R Olgin  68 y.o. female  5 Days Post-Op  Procedure(s): COLOSTOMY TAKEDOWN /PARASTOMAL HERNIA REPAIR  Improving  Plan:  -improve pain control.  Heat RTC.  Inc tylenol & oxycodone.  Try to wean off IV morphine -anxiolysis -adv to HH solids -CKD - Cr OK w diuretic -HTN controlled -synthroid  -VTE prophylaxis- SCDs, etc -mobilize as tolerated to help recovery  D/C patient from hospital when patient meets criteria (anticipate in 1 day(s))  Not quite ready today:  Tolerating oral intake well Ambulating in walkways Adequate pain control without IV medications Urinating  Having flatus   [name], M.D., F.A.C.S. Gastrointestinal and Minimally Invasive Surgery [facility] [address] [place], [place] [address] [phone] Main / Paging   03/04/2014   Results:   Labs: Results for orders placed during the hospital encounter of 02/27/14 (from the past 48 hour(s))  CBC     Status: Abnormal   Collection Time    03/03/14  3:59 AM  Result Value Ref Range   WBC 8.2  4.0 - 10.5 K/uL   RBC 3.02 (*) 3.87 - 5.11 MIL/uL   Hemoglobin 8.8 (*) 12.0 - 15.0 g/dL   HCT 26.4 (*) 36.0 - 46.0 %   MCV 87.4  78.0 - 100.0 fL   MCH 29.1  26.0 - 34.0 pg   MCHC 33.3  30.0 - 36.0 g/dL   RDW 13.0  11.5 - 15.5 %   Platelets 178  150 - 400 K/uL  BASIC METABOLIC PANEL     Status: Abnormal   Collection Time    03/03/14  3:59 AM      Result Value Ref Range   Sodium 142  137  - 147 mEq/L   Potassium 3.7  3.7 - 5.3 mEq/L   Chloride 102  96 - 112 mEq/L   CO2 27  19 - 32 mEq/L   Glucose, Bld 94  70 - 99 mg/dL   BUN 8  6 - 23 mg/dL   Creatinine, Ser 1.21 (*) 0.50 - 1.10 mg/dL   Calcium 9.2  8.4 - 10.5 mg/dL   GFR calc non Af Amer 45 (*) >90 mL/min   GFR calc Af Amer 52 (*) >90 mL/min   Comment: (NOTE)     The eGFR has been calculated using the CKD EPI equation.     This calculation has not been validated in all clinical situations.     eGFR's persistently <90 mL/min signify possible Chronic Kidney     Disease.  CBC     Status: Abnormal   Collection Time    03/04/14  4:46 AM      Result Value Ref Range   WBC 8.1  4.0 - 10.5 K/uL   RBC 3.38 (*) 3.87 - 5.11 MIL/uL   Hemoglobin 9.7 (*) 12.0 - 15.0 g/dL   HCT 29.1 (*) 36.0 - 46.0 %   MCV 86.1  78.0 - 100.0 fL   MCH 28.7  26.0 - 34.0 pg   MCHC 33.3  30.0 - 36.0 g/dL   RDW 13.1  11.5 - 15.5 %   Platelets 204  150 - 400 K/uL  BASIC METABOLIC PANEL     Status: Abnormal   Collection Time    03/04/14  4:46 AM      Result Value Ref Range   Sodium 141  137 - 147 mEq/L   Potassium 3.5 (*) 3.7 - 5.3 mEq/L   Chloride 99  96 - 112 mEq/L   CO2 29  19 - 32 mEq/L   Glucose, Bld 104 (*) 70 - 99 mg/dL   BUN 11  6 - 23 mg/dL   Creatinine, Ser 1.36 (*) 0.50 - 1.10 mg/dL   Calcium 9.4  8.4 - 10.5 mg/dL   GFR calc non Af Amer 39 (*) >90 mL/min   GFR calc Af Amer 45 (*) >90 mL/min   Comment: (NOTE)     The eGFR has been calculated using the CKD EPI equation.     This calculation has not been validated in all clinical situations.     eGFR's persistently <90 mL/min signify possible Chronic Kidney     Disease.    Imaging / Studies: No results found.  Medications / Allergies: per chart  Antibiotics: Anti-infectives   Start     Dose/Rate Route Frequency Ordered Stop   02/27/14 1800  ciprofloxacin (CIPRO) IVPB 400 mg     400 mg 200 mL/hr over 60 Minutes Intravenous Every 12 hours 02/27/14 1159  02/27/14 1910    02/27/14 1800  metroNIDAZOLE (FLAGYL) IVPB 500 mg     500 mg 100 mL/hr over 60 Minutes Intravenous Every 8 hours 02/27/14 1159 02/28/14 0304   02/27/14 0532  ceFAZolin (ANCEF) IVPB 2 g/50 mL premix     2 g 100 mL/hr over 30 Minutes Intravenous On call to O.R. 02/27/14 0532 02/27/14 0740       Note: This dictation was prepared with Dragon/digital dictation along with Smartphrase technology. Any transcriptional errors that result from this process are unintentional.

## 2014-03-04 NOTE — Progress Notes (Signed)
Pt tolerated staple removal well from midline incision, site looks good, ostomy site cleansed and redressed (staples left in this incision)

## 2014-03-05 ENCOUNTER — Other Ambulatory Visit: Payer: Self-pay | Admitting: *Deleted

## 2014-03-05 LAB — CBC
HCT: 30.8 % — ABNORMAL LOW (ref 36.0–46.0)
Hemoglobin: 10.2 g/dL — ABNORMAL LOW (ref 12.0–15.0)
MCH: 29 pg (ref 26.0–34.0)
MCHC: 33.1 g/dL (ref 30.0–36.0)
MCV: 87.5 fL (ref 78.0–100.0)
Platelets: 233 10*3/uL (ref 150–400)
RBC: 3.52 MIL/uL — ABNORMAL LOW (ref 3.87–5.11)
RDW: 13.4 % (ref 11.5–15.5)
WBC: 11 10*3/uL — ABNORMAL HIGH (ref 4.0–10.5)

## 2014-03-05 LAB — BASIC METABOLIC PANEL
BUN: 13 mg/dL (ref 6–23)
CO2: 26 mEq/L (ref 19–32)
Calcium: 9.7 mg/dL (ref 8.4–10.5)
Chloride: 100 mEq/L (ref 96–112)
Creatinine, Ser: 1.39 mg/dL — ABNORMAL HIGH (ref 0.50–1.10)
GFR calc Af Amer: 44 mL/min — ABNORMAL LOW (ref >90)
GFR calc non Af Amer: 38 mL/min — ABNORMAL LOW (ref >90)
Glucose, Bld: 110 mg/dL — ABNORMAL HIGH (ref 70–99)
Potassium: 3.3 mEq/L — ABNORMAL LOW (ref 3.7–5.3)
Sodium: 141 mEq/L (ref 137–147)

## 2014-03-05 MED ORDER — NYSTATIN 100000 UNIT/ML MT SUSP: 5.0000 mL | Freq: Four times a day (QID) | OROMUCOSAL | Status: DC

## 2014-03-05 MED ORDER — LORAZEPAM 0.5 MG PO TABS: 0.5000 mg | ORAL_TABLET | Freq: Three times a day (TID) | ORAL | Status: DC | PRN

## 2014-03-05 MED ORDER — OXYCODONE HCL 10 MG PO TABS: 10.0000 mg | ORAL_TABLET | ORAL | Status: DC | PRN

## 2014-03-05 MED ORDER — POTASSIUM CHLORIDE ER 20 MEQ PO TBCR: 20.0000 meq | EXTENDED_RELEASE_TABLET | Freq: Two times a day (BID) | ORAL | Status: DC

## 2014-03-05 MED ORDER — ACETAMINOPHEN 500 MG PO TABS: 1000.0000 mg | ORAL_TABLET | Freq: Four times a day (QID) | ORAL | Status: DC

## 2014-03-05 MED ORDER — HYDROCHLOROTHIAZIDE 12.5 MG PO CAPS: 12.5000 mg | ORAL_CAPSULE | Freq: Every morning | ORAL | Status: DC

## 2014-03-05 MED ORDER — LEVOTHYROXINE SODIUM 125 MCG PO TABS: 125.0000 ug | ORAL_TABLET | Freq: Every day | ORAL | Status: DC

## 2014-03-05 NOTE — Telephone Encounter (Signed)
Left msg on triage wanting her HCTZ & Levothyroxine sent to [facility]. Called pt back no answer LMOM both med has been sent to mail service...Johny Chess

## 2014-03-05 NOTE — Discharge Instructions (Signed)
[facility], [place] [phone]  ABDOMINAL SURGERY: POST OP INSTRUCTIONS  Always review your discharge instruction sheet given to you by the facility where your surgery was performed.  IF YOU HAVE DISABILITY OR FAMILY LEAVE FORMS, YOU MUST BRING THEM TO THE OFFICE FOR PROCESSING.  PLEASE DO NOT GIVE THEM TO YOUR DOCTOR.  1. A prescription for pain medication may be given to you upon discharge.  Take your pain medication as prescribed, if needed.  If narcotic pain medicine is not needed, then you may take acetaminophen (Tylenol) or ibuprofen (Advil) as needed. 2. Take your usually prescribed medications unless otherwise directed. 3. If you need a refill on your pain medication, please contact your pharmacy. They will contact our office to request authorization.  Prescriptions will not be filled after 5pm or on week-ends. 4. You should follow a light diet the first few days after arrival home, such as soup and crackers, pudding, etc.unless your doctor has advised otherwise. A high-fiber, low fat diet can be resumed as tolerated.   Be sure to include lots of fluids daily. Most patients will experience some swelling and bruising on the chest and neck area.  Ice packs will help.  Swelling and bruising can take several days to resolve 5. Most patients will experience some swelling and bruising in the area of the incision. Ice pack will help. Swelling and bruising can take several days to resolve..  6. It is common to experience some constipation if taking pain medication after surgery.  Increasing fluid intake and taking a stool softener will usually help or prevent this problem from occurring.  A mild laxative (Milk of Magnesia or Miralax) should be taken according to package directions if there are no bowel movements after 48 hours. 7.  You may have steri-strips (small skin tapes) in place directly over the incision.  These strips should be left on the skin for 10-14 days.  If your  surgeon used skin glue on the incision, you may shower in 48 hours.  The glue will flake off over the next 2-3 weeks.  Any sutures or staples will be removed at the office during your follow-up visit. You may find that a light gauze bandage over your incision may keep your staples from being rubbed or pulled. You may shower and replace the bandage daily. 8. ACTIVITIES:  You may resume regular (light) daily activities beginning the next day--such as daily self-care, walking, climbing stairs--gradually increasing activities as tolerated.  You may have sexual intercourse when it is comfortable.  Refrain from any heavy lifting or straining until approved by your doctor. a. You may drive when you no longer are taking prescription pain medication, you can comfortably wear a seatbelt, and you can safely maneuver your car and apply brakes b. Return to Work: __________80 weeks if applicable_________________________ 9. You should see your doctor in the office for a follow-up appointment approximately two weeks after your surgery.  Make sure that you call for this appointment within a day or two after you arrive home to insure a convenient appointment time. OTHER INSTRUCTIONS:  _____________________________________________________________ _____________________________________________________________  WHEN TO CALL YOUR DOCTOR: 1. Fever over 101.0 2. Inability to urinate 3. Nausea and/or vomiting 4. Extreme swelling or bruising 5. Continued bleeding from incision. 6. Increased pain, redness, or drainage from the incision. 7. Difficulty swallowing or breathing 8. Muscle cramping or spasms. 9. Numbness or tingling in hands or feet or around lips.  The clinic staff is  available to answer your questions during regular business hours.  Please dont hesitate to call and ask to speak to one of the nurses if you have concerns.  For further questions, please visit [email or web address]

## 2014-03-05 NOTE — Discharge Summary (Signed)
Physician Discharge Summary  Patient ID: Valerie Jackson MRN: [id number] DOB/AGE: 69-Jan-1947 69 y.o.  Admit date: 02/27/2014 Discharge date: 03/05/2014  Admission Diagnoses: Patient Active Problem List   Diagnosis Date Noted  . Diverticulitis, s/p hartman's procedure 09/2013 with new parastomal hernia 03/03/2014  . CKD (chronic kidney disease) stage 2, GFR 60-89 ml/min 03/03/2014  . Diverticulitis 02/27/2014  . Chest x-ray abnormality 11/29/2013  . COPD (chronic obstructive pulmonary disease) with emphysema 11/08/2013  . Nonspecific elevation of levels of transaminase or lactic acid dehydrogenase (LDH) 07/19/2013  . Sickle cell trait 07/19/2013  . Obesity (BMI 30-39.9) 04/12/2013  . Other and unspecified hyperlipidemia 02/28/2013  . Hypertension 02/28/2013  . Unspecified hypothyroidism 02/28/2013    Discharge Diagnoses:  Principal Problem:   Parastomal hernia s/p colostomy takedown & repair 02/27/2014 Active Problems:   Hypertension   Obesity (BMI 30-39.9)   COPD (chronic obstructive pulmonary disease) with emphysema   Diverticulitis   CKD (chronic kidney disease) stage 2, GFR 60-89 ml/min hypokalemia  Discharged Condition: stable  Hospital Course:  Patient was admitted to the hospital following her colostomy takedown and parastomal hernia repair. She had issues with pain control up front. She used her PCA and got slightly somnolent. She also had some depression and her respiratory status. This was decreased to a reduced dose PCA, however she still had some issues. She was then placed on 24 hours of portal and IV Tylenol. This controlled her pain much better. However, she did have a slight bump in her creatinine from 1.2-1.4. She was not placed on any additional Toradol. She was able to void with her catheter out. She improved with oral oxycodone and occasional anti-anxiolytic. While she was in the hospital. One of her old friends passed away suddenly. She was unable to go to the  funeral and was very upset about this.  Consults: None  Significant Diagnostic Studies: labs: 11 WBCs, HCT 30.8, cr. 1.39, K 3.3.    Treatments: surgery: see above.    Discharge Exam: Blood pressure 105/57, pulse 88, temperature 98.4 F (36.9 C), temperature source Oral, resp. rate 18, height 5' 5"  (1.651 m), weight 192 lb 7.4 oz (87.3 kg), SpO2 95.00%. General appearance: alert, cooperative and no distress Resp: breathing comfortably GI: s, nt, nd, no drainage from wound.  no erythema.    Disposition: 01-Home or Self Care      Discharge Instructions   Call MD for:  persistant nausea and vomiting    Complete by:  As directed      Call MD for:  redness, tenderness, or signs of infection (pain, swelling, redness, odor or green/yellow discharge around incision site)    Complete by:  As directed      Call MD for:  severe uncontrolled pain    Complete by:  As directed      Call MD for:  temperature >100.4    Complete by:  As directed      Diet - low sodium heart healthy    Complete by:  As directed      Increase activity slowly    Complete by:  As directed             Medication List         acetaminophen 500 MG tablet  Commonly known as:  TYLENOL  Take 2 tablets (1,000 mg total) by mouth every 6 (six) hours.     cholecalciferol 1000 UNITS tablet  Commonly known as:  VITAMIN D  Take 1 tablet (  1,000 Units total) by mouth daily.     hydrochlorothiazide 12.5 MG capsule  Commonly known as:  MICROZIDE  Take 1 capsule (12.5 mg total) by mouth every morning.     levothyroxine 125 MCG tablet  Commonly known as:  SYNTHROID, LEVOTHROID  Take 1 tablet (125 mcg total) by mouth daily before breakfast.     LORazepam 0.5 MG tablet  Commonly known as:  ATIVAN  Take 1-2 tablets (0.5-1 mg total) by mouth every 8 (eight) hours as needed for anxiety.     meclizine 12.5 MG tablet  Commonly known as:  ANTIVERT  Take 12.5 mg by mouth 3 (three) times daily as needed for dizziness (pt  does take one dose every morning).     meclizine 12.5 MG tablet  Commonly known as:  ANTIVERT  Take 1 tablet (12.5 mg total) by mouth 3 (three) times daily as needed for dizziness.     nystatin 100000 UNIT/ML suspension  Commonly known as:  MYCOSTATIN  Take 5 mLs (500,000 Units total) by mouth 4 (four) times daily.     Oxycodone HCl 10 MG Tabs  Take 1-1.5 tablets (10-15 mg total) by mouth every 4 (four) hours as needed for moderate pain, severe pain or breakthrough pain.     polyethylene glycol packet  Commonly known as:  MIRALAX / GLYCOLAX  Take 17 g by mouth daily.     Potassium Chloride ER 20 MEQ Tbcr  Take 20 mEq by mouth 2 (two) times daily.     Tiotropium Bromide Monohydrate 2.5 MCG/ACT Aers  Commonly known as:  SPIRIVA RESPIMAT  Inhale 2 puffs into the lungs daily.       Follow-up Information   Follow up with [facility], [place] In 2 weeks.   Specialty:  General Surgery   Contact information:   [address] [place] [address] [phone]       Signed: Stark Klein 03/05/2014, 10:48 AM

## 2014-03-10 ENCOUNTER — Emergency Department (HOSPITAL_COMMUNITY): Payer: Medicare Other

## 2014-03-10 ENCOUNTER — Inpatient Hospital Stay (HOSPITAL_COMMUNITY)
Admission: EM | Admit: 2014-03-10 | Discharge: 2014-03-22 | DRG: 863 | Disposition: A | Discharge status: Skilled Nursing Facility | Payer: Medicare Other | Attending: General Surgery | Admitting: General Surgery

## 2014-03-10 ENCOUNTER — Encounter (HOSPITAL_COMMUNITY): Payer: Self-pay | Admitting: Emergency Medicine

## 2014-03-10 DIAGNOSIS — N182 Chronic kidney disease, stage 2 (mild): Secondary | ICD-10-CM | POA: Diagnosis present

## 2014-03-10 DIAGNOSIS — D573 Sickle-cell trait: Secondary | ICD-10-CM | POA: Diagnosis present

## 2014-03-10 DIAGNOSIS — Z9089 Acquired absence of other organs: Secondary | ICD-10-CM

## 2014-03-10 DIAGNOSIS — Z8249 Family history of ischemic heart disease and other diseases of the circulatory system: Secondary | ICD-10-CM | POA: Diagnosis not present

## 2014-03-10 DIAGNOSIS — F329 Major depressive disorder, single episode, unspecified: Secondary | ICD-10-CM | POA: Diagnosis not present

## 2014-03-10 DIAGNOSIS — K219 Gastro-esophageal reflux disease without esophagitis: Secondary | ICD-10-CM | POA: Diagnosis not present

## 2014-03-10 DIAGNOSIS — Z823 Family history of stroke: Secondary | ICD-10-CM | POA: Diagnosis not present

## 2014-03-10 DIAGNOSIS — Z6830 Body mass index (BMI) 30.0-30.9, adult: Secondary | ICD-10-CM | POA: Diagnosis not present

## 2014-03-10 DIAGNOSIS — K759 Inflammatory liver disease, unspecified: Secondary | ICD-10-CM | POA: Diagnosis not present

## 2014-03-10 DIAGNOSIS — Z803 Family history of malignant neoplasm of breast: Secondary | ICD-10-CM | POA: Diagnosis not present

## 2014-03-10 DIAGNOSIS — R011 Cardiac murmur, unspecified: Secondary | ICD-10-CM | POA: Diagnosis not present

## 2014-03-10 DIAGNOSIS — Z87891 Personal history of nicotine dependence: Secondary | ICD-10-CM | POA: Diagnosis not present

## 2014-03-10 DIAGNOSIS — K435 Parastomal hernia without obstruction or  gangrene: Secondary | ICD-10-CM | POA: Diagnosis present

## 2014-03-10 DIAGNOSIS — E876 Hypokalemia: Secondary | ICD-10-CM | POA: Diagnosis not present

## 2014-03-10 DIAGNOSIS — K625 Hemorrhage of anus and rectum: Secondary | ICD-10-CM | POA: Diagnosis not present

## 2014-03-10 DIAGNOSIS — N739 Female pelvic inflammatory disease, unspecified: Secondary | ICD-10-CM | POA: Diagnosis present

## 2014-03-10 DIAGNOSIS — N730 Acute parametritis and pelvic cellulitis: Secondary | ICD-10-CM | POA: Diagnosis present

## 2014-03-10 DIAGNOSIS — Z9049 Acquired absence of other specified parts of digestive tract: Secondary | ICD-10-CM

## 2014-03-10 DIAGNOSIS — IMO0002 Reserved for concepts with insufficient information to code with codable children: Secondary | ICD-10-CM | POA: Diagnosis present

## 2014-03-10 DIAGNOSIS — J449 Chronic obstructive pulmonary disease, unspecified: Secondary | ICD-10-CM | POA: Diagnosis not present

## 2014-03-10 DIAGNOSIS — R51 Headache: Secondary | ICD-10-CM | POA: Diagnosis not present

## 2014-03-10 DIAGNOSIS — N731 Chronic parametritis and pelvic cellulitis: Secondary | ICD-10-CM | POA: Diagnosis not present

## 2014-03-10 DIAGNOSIS — N189 Chronic kidney disease, unspecified: Secondary | ICD-10-CM | POA: Diagnosis not present

## 2014-03-10 DIAGNOSIS — I129 Hypertensive chronic kidney disease with stage 1 through stage 4 chronic kidney disease, or unspecified chronic kidney disease: Secondary | ICD-10-CM | POA: Diagnosis present

## 2014-03-10 DIAGNOSIS — E039 Hypothyroidism, unspecified: Secondary | ICD-10-CM | POA: Diagnosis present

## 2014-03-10 DIAGNOSIS — Z5189 Encounter for other specified aftercare: Secondary | ICD-10-CM | POA: Diagnosis not present

## 2014-03-10 DIAGNOSIS — E785 Hyperlipidemia, unspecified: Secondary | ICD-10-CM | POA: Diagnosis not present

## 2014-03-10 DIAGNOSIS — K5732 Diverticulitis of large intestine without perforation or abscess without bleeding: Secondary | ICD-10-CM | POA: Diagnosis not present

## 2014-03-10 DIAGNOSIS — J438 Other emphysema: Secondary | ICD-10-CM | POA: Diagnosis present

## 2014-03-10 DIAGNOSIS — I82409 Acute embolism and thrombosis of unspecified deep veins of unspecified lower extremity: Secondary | ICD-10-CM | POA: Diagnosis not present

## 2014-03-10 DIAGNOSIS — I10 Essential (primary) hypertension: Secondary | ICD-10-CM | POA: Diagnosis not present

## 2014-03-10 DIAGNOSIS — E669 Obesity, unspecified: Secondary | ICD-10-CM | POA: Diagnosis present

## 2014-03-10 DIAGNOSIS — K630 Abscess of intestine: Secondary | ICD-10-CM | POA: Diagnosis not present

## 2014-03-10 DIAGNOSIS — K651 Peritoneal abscess: Secondary | ICD-10-CM | POA: Diagnosis not present

## 2014-03-10 DIAGNOSIS — Z9889 Other specified postprocedural states: Secondary | ICD-10-CM

## 2014-03-10 DIAGNOSIS — K921 Melena: Secondary | ICD-10-CM | POA: Diagnosis present

## 2014-03-10 DIAGNOSIS — M5430 Sciatica, unspecified side: Secondary | ICD-10-CM | POA: Diagnosis not present

## 2014-03-10 DIAGNOSIS — K929 Disease of digestive system, unspecified: Secondary | ICD-10-CM | POA: Diagnosis present

## 2014-03-10 DIAGNOSIS — T8140XA Infection following a procedure, unspecified, initial encounter: Secondary | ICD-10-CM | POA: Diagnosis present

## 2014-03-10 DIAGNOSIS — F3289 Other specified depressive episodes: Secondary | ICD-10-CM | POA: Diagnosis not present

## 2014-03-10 DIAGNOSIS — K6389 Other specified diseases of intestine: Secondary | ICD-10-CM | POA: Diagnosis not present

## 2014-03-10 DIAGNOSIS — Z8739 Personal history of other diseases of the musculoskeletal system and connective tissue: Secondary | ICD-10-CM | POA: Diagnosis not present

## 2014-03-10 DIAGNOSIS — Z808 Family history of malignant neoplasm of other organs or systems: Secondary | ICD-10-CM

## 2014-03-10 DIAGNOSIS — Y832 Surgical operation with anastomosis, bypass or graft as the cause of abnormal reaction of the patient, or of later complication, without mention of misadventure at the time of the procedure: Secondary | ICD-10-CM | POA: Diagnosis present

## 2014-03-10 DIAGNOSIS — J439 Emphysema, unspecified: Secondary | ICD-10-CM | POA: Diagnosis present

## 2014-03-10 LAB — CBC
HCT: 29.1 % — ABNORMAL LOW (ref 36.0–46.0)
Hemoglobin: 9.7 g/dL — ABNORMAL LOW (ref 12.0–15.0)
MCH: 29 pg (ref 26.0–34.0)
MCHC: 33.3 g/dL (ref 30.0–36.0)
MCV: 87.1 fL (ref 78.0–100.0)
Platelets: 268 10*3/uL (ref 150–400)
RBC: 3.34 MIL/uL — ABNORMAL LOW (ref 3.87–5.11)
RDW: 13.5 % (ref 11.5–15.5)
WBC: 17.5 10*3/uL — ABNORMAL HIGH (ref 4.0–10.5)

## 2014-03-10 LAB — COMPREHENSIVE METABOLIC PANEL
ALT: 39 U/L — ABNORMAL HIGH (ref 0–35)
AST: 36 U/L (ref 0–37)
Albumin: 3.3 g/dL — ABNORMAL LOW (ref 3.5–5.2)
Alkaline Phosphatase: 125 U/L — ABNORMAL HIGH (ref 39–117)
BUN: 8 mg/dL (ref 6–23)
CO2: 25 mEq/L (ref 19–32)
Calcium: 9.5 mg/dL (ref 8.4–10.5)
Chloride: 96 mEq/L (ref 96–112)
Creatinine, Ser: 1.3 mg/dL — ABNORMAL HIGH (ref 0.50–1.10)
GFR calc Af Amer: 47 mL/min — ABNORMAL LOW (ref >90)
GFR calc non Af Amer: 41 mL/min — ABNORMAL LOW (ref >90)
Glucose, Bld: 162 mg/dL — ABNORMAL HIGH (ref 70–99)
Potassium: 3.1 mEq/L — ABNORMAL LOW (ref 3.7–5.3)
Sodium: 137 mEq/L (ref 137–147)
Total Bilirubin: 0.7 mg/dL (ref 0.3–1.2)
Total Protein: 7.6 g/dL (ref 6.0–8.3)

## 2014-03-10 LAB — URINE MICROSCOPIC-ADD ON

## 2014-03-10 LAB — URINALYSIS, ROUTINE W REFLEX MICROSCOPIC
Bilirubin Urine: NEGATIVE
Glucose, UA: NEGATIVE mg/dL
Hgb urine dipstick: NEGATIVE
Ketones, ur: NEGATIVE mg/dL
Nitrite: NEGATIVE
Protein, ur: NEGATIVE mg/dL
Specific Gravity, Urine: 1.013 (ref 1.005–1.030)
Urobilinogen, UA: 0.2 mg/dL (ref 0.0–1.0)
pH: 6.5 (ref 5.0–8.0)

## 2014-03-10 LAB — I-STAT CG4 LACTIC ACID, ED: Lactic Acid, Venous: 3.47 mmol/L — ABNORMAL HIGH (ref 0.5–2.2)

## 2014-03-10 LAB — POC OCCULT BLOOD, ED: Fecal Occult Bld: POSITIVE — AB

## 2014-03-10 MED ORDER — HEPARIN SODIUM (PORCINE) 5000 UNIT/ML IJ SOLN
5000.0000 [IU] | Freq: Three times a day (TID) | INTRAMUSCULAR | Status: DC
  Filled 2014-03-10 (×23): qty 1

## 2014-03-10 MED ORDER — IOHEXOL 300 MG/ML  SOLN: 80.0000 mL | Freq: Once | INTRAMUSCULAR | Status: DC | PRN

## 2014-03-10 MED ORDER — LORAZEPAM 0.5 MG PO TABS
0.5000 mg | ORAL_TABLET | Freq: Three times a day (TID) | ORAL | Status: DC | PRN
  Administered 2014-03-11: 1 mg via ORAL
  Administered 2014-03-13: 0.5 mg via ORAL
  Administered 2014-03-16 – 2014-03-17 (×2): 1 mg via ORAL
  Filled 2014-03-10: qty 1
  Filled 2014-03-10 (×3): qty 2

## 2014-03-10 MED ORDER — IOHEXOL 300 MG/ML  SOLN
50.0000 mL | Freq: Once | INTRAMUSCULAR | Status: AC | PRN
  Administered 2014-03-10: 50 mL via ORAL

## 2014-03-10 MED ORDER — TIOTROPIUM BROMIDE MONOHYDRATE 2.5 MCG/ACT IN AERS: 2.0000 | INHALATION_SPRAY | Freq: Every day | RESPIRATORY_TRACT | Status: DC

## 2014-03-10 MED ORDER — ONDANSETRON HCL 4 MG/2ML IJ SOLN
4.0000 mg | Freq: Four times a day (QID) | INTRAMUSCULAR | Status: DC | PRN
Start: 2014-03-10 — End: 2014-03-22

## 2014-03-10 MED ORDER — MORPHINE SULFATE 4 MG/ML IJ SOLN
4.0000 mg | INTRAMUSCULAR | Status: DC | PRN
  Administered 2014-03-10 – 2014-03-13 (×18): 4 mg via INTRAVENOUS
  Filled 2014-03-10 (×19): qty 1

## 2014-03-10 MED ORDER — LEVOTHYROXINE SODIUM 125 MCG PO TABS
125.0000 ug | ORAL_TABLET | Freq: Every day | ORAL | Status: DC
  Administered 2014-03-12 – 2014-03-22 (×11): 125 ug via ORAL
  Filled 2014-03-10 (×14): qty 1

## 2014-03-10 MED ORDER — MORPHINE SULFATE 4 MG/ML IJ SOLN
6.0000 mg | Freq: Once | INTRAMUSCULAR | Status: AC
  Administered 2014-03-10: 6 mg via INTRAVENOUS
  Filled 2014-03-10: qty 2

## 2014-03-10 MED ORDER — ONDANSETRON HCL 4 MG/2ML IJ SOLN
4.0000 mg | Freq: Once | INTRAMUSCULAR | Status: AC
  Administered 2014-03-10: 4 mg via INTRAVENOUS
  Filled 2014-03-10: qty 2

## 2014-03-10 MED ORDER — MECLIZINE HCL 12.5 MG PO TABS
12.5000 mg | ORAL_TABLET | Freq: Three times a day (TID) | ORAL | Status: DC | PRN
  Filled 2014-03-10: qty 1

## 2014-03-10 MED ORDER — TIOTROPIUM BROMIDE MONOHYDRATE 18 MCG IN CAPS
18.0000 ug | ORAL_CAPSULE | Freq: Every day | RESPIRATORY_TRACT | Status: DC
  Administered 2014-03-11 – 2014-03-22 (×12): 18 ug via RESPIRATORY_TRACT
  Filled 2014-03-10 (×3): qty 5

## 2014-03-10 MED ORDER — PIPERACILLIN-TAZOBACTAM 3.375 G IVPB
3.3750 g | Freq: Three times a day (TID) | INTRAVENOUS | Status: DC
  Administered 2014-03-10 – 2014-03-22 (×36): 3.375 g via INTRAVENOUS
  Filled 2014-03-10 (×38): qty 50

## 2014-03-10 MED ORDER — PANTOPRAZOLE SODIUM 40 MG IV SOLR
40.0000 mg | Freq: Every day | INTRAVENOUS | Status: DC
  Administered 2014-03-10 – 2014-03-21 (×12): 40 mg via INTRAVENOUS
  Filled 2014-03-10 (×13): qty 40

## 2014-03-10 MED ORDER — HYDROCHLOROTHIAZIDE 12.5 MG PO CAPS
12.5000 mg | ORAL_CAPSULE | Freq: Every morning | ORAL | Status: DC
  Administered 2014-03-10 – 2014-03-22 (×12): 12.5 mg via ORAL
  Filled 2014-03-10 (×13): qty 1

## 2014-03-10 MED ORDER — IOHEXOL 300 MG/ML  SOLN
100.0000 mL | Freq: Once | INTRAMUSCULAR | Status: AC | PRN
  Administered 2014-03-10: 100 mL via INTRAVENOUS

## 2014-03-10 MED ORDER — NYSTATIN 100000 UNIT/ML MT SUSP
5.0000 mL | Freq: Four times a day (QID) | OROMUCOSAL | Status: DC
  Administered 2014-03-10 – 2014-03-22 (×36): 500000 [IU] via ORAL
  Filled 2014-03-10 (×51): qty 5

## 2014-03-10 MED ORDER — KCL IN DEXTROSE-NACL 20-5-0.9 MEQ/L-%-% IV SOLN
INTRAVENOUS | Status: AC
  Administered 2014-03-10 – 2014-03-13 (×6): via INTRAVENOUS
  Administered 2014-03-13: 100 mL via INTRAVENOUS
  Administered 2014-03-15 – 2014-03-16 (×3): via INTRAVENOUS
  Administered 2014-03-17: 75 mL/h via INTRAVENOUS
  Administered 2014-03-18: 04:00:00 via INTRAVENOUS
  Filled 2014-03-10 (×18): qty 1000

## 2014-03-10 MED ORDER — SODIUM CHLORIDE 0.9 % IV BOLUS (SEPSIS)
1000.0000 mL | Freq: Once | INTRAVENOUS | Status: AC
  Administered 2014-03-10: 1000 mL via INTRAVENOUS

## 2014-03-10 NOTE — H&P (Signed)
Valerie Jackson is an 68 y.o. female.   Chief Complaint: blood in stool HPI: the pt is a 68yo bf who is about 12 days out from a colostomy takedown. She had a history of diverticulitis with sigmoid colectomy and colostomy. She denies fever. She has had some lower abdominal tenderness. She noticed today the when she would pass gas she would expel some bloody material. She denies any lightheadedness. She came to ER where a CT shows abscess near anastamosis worrisome for contained leak  Past Medical History  Diagnosis Date  . Hypertension   . Hypothyroid   . Depression   . Hypercholesteremia   . Sickle cell trait   . GERD (gastroesophageal reflux disease)   . History of blood transfusion 1965  . Diverticulitis 09/2013    with perforation  . Pericolonic abscess 2015  . Obesity   . Hypokalemia   . H/O hiatal hernia   . Arthritis     fingers  . Headache(784.0)     migraine every 5- to 6 years  . Blood clot in vein 1968  . Hepatitis age 72 or 28    had heapatitis, not sure which kind  . Heart murmur since age 47    mild  . Diverticulitis of colon with perforation 09/28/2013    Past Surgical History  Procedure Laterality Date  . Cholecystectomy  2002  . Tonsillectomy  1952  . Colon resection N/A 10/11/2013    Procedure: EXPLORATORY LAPAROTOMY WITH RECTOSIGMOID COLECTOMY AND END COLOSTOMY;  Surgeon: Valerie Klein, MD;  Location: [facility];  Service: General;  Laterality: N/A;  . Colostomy takedown N/A 02/27/2014    Procedure: COLOSTOMY TAKEDOWN Valerie Jackson HERNIA REPAIR;  Surgeon: Valerie Klein, MD;  Location: [facility];  Service: General;  Laterality: N/A;    Family History  Problem Relation Age of Onset  . Hypertension Mother   . Aneurysm Mother   . Cancer Mother     Uterine  . Thyroid cancer Mother   . Cancer Other   . Thyroid cancer Valerie   . Arthritis Valerie   . Hyperlipidemia Valerie   . Hypertension Valerie   . Stroke Valerie   . Kidney disease Valerie   . Hyperlipidemia Brother    . Thyroid cancer Daughter   . Breast cancer Maternal Grandmother   . Hypertension Maternal Grandmother    Social History:  reports that she quit smoking about 6 months ago. Her smoking use included Cigarettes. She has a 6.25 pack-year smoking history. She has never used smokeless tobacco. She reports that she does not drink alcohol or use illicit drugs.  Allergies:  Allergies  Allergen Reactions  . Estrogens     Blood clot in leg     (Not in a hospital admission)  Results for orders placed during the hospital encounter of 03/10/14 (from the past 48 hour(s))  CBC     Status: Abnormal   Collection Time    03/10/14 10:31 AM      Result Value Ref Range   WBC 17.5 (*) 4.0 - 10.5 K/uL   RBC 3.34 (*) 3.87 - 5.11 MIL/uL   Hemoglobin 9.7 (*) 12.0 - 15.0 g/dL   HCT 29.1 (*) 36.0 - 46.0 %   MCV 87.1  78.0 - 100.0 fL   MCH 29.0  26.0 - 34.0 pg   MCHC 33.3  30.0 - 36.0 g/dL   RDW 13.5  11.5 - 15.5 %   Platelets 268  150 - 400 K/uL  COMPREHENSIVE METABOLIC  PANEL     Status: Abnormal   Collection Time    03/10/14 10:31 AM      Result Value Ref Range   Sodium 137  137 - 147 mEq/L   Potassium 3.1 (*) 3.7 - 5.3 mEq/L   Chloride 96  96 - 112 mEq/L   CO2 25  19 - 32 mEq/L   Glucose, Bld 162 (*) 70 - 99 mg/dL   BUN 8  6 - 23 mg/dL   Creatinine, Ser 1.30 (*) 0.50 - 1.10 mg/dL   Calcium 9.5  8.4 - 10.5 mg/dL   Total Protein 7.6  6.0 - 8.3 g/dL   Albumin 3.3 (*) 3.5 - 5.2 g/dL   AST 36  0 - 37 U/L   ALT 39 (*) 0 - 35 U/L   Alkaline Phosphatase 125 (*) 39 - 117 U/L   Total Bilirubin 0.7  0.3 - 1.2 mg/dL   GFR calc non Af Amer 41 (*) >90 mL/min   GFR calc Af Amer 47 (*) >90 mL/min   Comment: (NOTE)     The eGFR has been calculated using the CKD EPI equation.     This calculation has not been validated in all clinical situations.     eGFR's persistently <90 mL/min signify possible Chronic Kidney     Disease.  I-STAT CG4 LACTIC ACID, ED     Status: Abnormal   Collection Time     03/10/14 10:58 AM      Result Value Ref Range   Lactic Acid, Venous 3.47 (*) 0.5 - 2.2 mmol/L  POC OCCULT BLOOD, ED     Status: Abnormal   Collection Time    03/10/14 11:55 AM      Result Value Ref Range   Fecal Occult Bld POSITIVE (*) NEGATIVE  URINALYSIS, ROUTINE W REFLEX MICROSCOPIC     Status: Abnormal   Collection Time    03/10/14  1:30 PM      Result Value Ref Range   Color, Urine YELLOW  YELLOW   APPearance CLEAR  CLEAR   Specific Gravity, Urine 1.013  1.005 - 1.030   pH 6.5  5.0 - 8.0   Glucose, UA NEGATIVE  NEGATIVE mg/dL   Hgb urine dipstick NEGATIVE  NEGATIVE   Bilirubin Urine NEGATIVE  NEGATIVE   Ketones, ur NEGATIVE  NEGATIVE mg/dL   Protein, ur NEGATIVE  NEGATIVE mg/dL   Urobilinogen, UA 0.2  0.0 - 1.0 mg/dL   Nitrite NEGATIVE  NEGATIVE   Leukocytes, UA TRACE (*) NEGATIVE  URINE MICROSCOPIC-ADD ON     Status: Abnormal   Collection Time    03/10/14  1:30 PM      Result Value Ref Range   Squamous Epithelial / LPF FEW (*) RARE   WBC, UA 0-2  <3 WBC/hpf   Ct Abdomen Pelvis W Contrast  03/10/2014   CLINICAL DATA:  abd pain recent reversal of colostomy. RECTAL BLEEDING  EXAM: CT ABDOMEN AND PELVIS WITH CONTRAST  TECHNIQUE: Multidetector CT imaging of the abdomen and pelvis was performed using the standard protocol following bolus administration of intravenous contrast.  CONTRAST:  61m OMNIPAQUE IOHEXOL 300 MG/ML SOLN, 1072mOMNIPAQUE IOHEXOL 300 MG/ML SOLN  COMPARISON:  02/08/2014 and earlier studies  FINDINGS: Minimal dependent atelectasis in the visualized lung bases. Diffuse fatty infiltration of the liver. Surgical clips in the gallbladder fossa. Central intrahepatic and extrahepatic biliary ductal dilatation as before. No focal liver lesion. Unremarkable spleen, adrenal glands, pancreas. Small bilateral low-attenuation renal lesions probably  cysts. No hydronephrosis. Aorta and portal vein patent.  Stomach is physiologically distended. Small bowel colon are nondilated.  There is moderate fecal material in the proximal colon. Rectosigmoid is decompressed. There is circumferential wall thickening in the proximal sigmoid colon. There is a anastomotic staple line near the rectosigmoid junction. There is adjacent enhancing fluid collection in the cul-de-sac measured approximately 4.4 x 5.9 x6.1 cm , containing a few small loculated gas bubbles. There are mild inflammatory/ edematous changes in the adjacent fat. Uterus and adnexal regions unremarkable. Urinary bladder is distended containing gas suggesting recent instrumentation. No ascites. No free air. No hydronephrosis. Degenerative disc disease L5-S1.  IMPRESSION: 1. 6.1 abscess adjacent to rectosigmoid anastomotic staple line with a few gas bubbles suggesting leak. This should be approachable for percutaneous transgluteal drainage if needed. 2. Additional ancillary findings as above.   Electronically Signed   By: Arne Cleveland M.D.   On: 03/10/2014 13:23    Review of Systems  Constitutional: Negative.   HENT: Negative.   Eyes: Negative.   Respiratory: Negative.   Cardiovascular: Negative.   Gastrointestinal: Positive for abdominal pain and blood in stool.  Genitourinary: Negative.   Musculoskeletal: Negative.   Skin: Negative.   Neurological: Negative.   Endo/Heme/Allergies: Negative.   Psychiatric/Behavioral: Negative.     Blood pressure 110/66, pulse 76, temperature 97.5 F (36.4 C), temperature source Oral, resp. rate 18, SpO2 96.00%. Physical Exam  Constitutional: She is oriented to person, place, and time. She appears well-developed and well-nourished.  HENT:  Head: Normocephalic and atraumatic.  Eyes: Conjunctivae and EOM are normal. Pupils are equal, round, and reactive to light.  Neck: Normal range of motion. Neck supple.  Cardiovascular: Normal rate, regular rhythm and normal heart sounds.   Respiratory: Effort normal and breath sounds normal.  GI: Soft. Bowel sounds are normal.  There is mild  distension. There is moderate tenderness over lower abdomen but no peritonitis  Musculoskeletal: Normal range of motion.  Neurological: She is alert and oriented to person, place, and time.  Skin: Skin is warm and dry.  Psychiatric: She has a normal mood and affect. Her behavior is normal.     Assessment/Plan The pt has an abscess associated with recent colostomy takedown near anastamosis and elevated wbc. She does not have peritonitis and hemodynamics are good. She is far enough out from surgery that i would be difficult to safely get back into her abdomen. Since she otherwise looks good I will plan to admit for abx, bowel rest, and have IR perc drain abscess.  TOTH III,PAUL S 03/10/2014, 3:35 PM

## 2014-03-10 NOTE — ED Provider Notes (Signed)
CSN: [id number]     Arrival date & time 03/10/14  0945 History   First MD Initiated Contact with Patient 03/10/14 1009     Chief Complaint  Patient presents with  . Rectal Bleeding     (Consider location/radiation/quality/duration/timing/severity/associated sxs/prior Treatment) HPI Patient presents to the emergency department with rectal bleeding, that started this morning and increasing abdominal pain.  The patient, states, that palpation makes the pain, worse.  Patient, states she took 2 oxycodone prior to arrival with minimal relief of her symptoms.  The patient, states, that nothing seems make her condition, better.  The patient denies chest pain, shortness of breath, vomiting, hematemesis, back pain, dysuria, fever, headache, blurred vision, weakness, dizziness, rash or syncope.  The patient, states, that she had a reversal of colostomy from a prior diverticulitis with abscess. Past Medical History  Diagnosis Date  . Hypertension   . Hypothyroid   . Depression   . Hypercholesteremia   . Sickle cell trait   . GERD (gastroesophageal reflux disease)   . History of blood transfusion 1965  . Diverticulitis 09/2013    with perforation  . Pericolonic abscess 2015  . Obesity   . Hypokalemia   . H/O hiatal hernia   . Arthritis     fingers  . Headache(784.0)     migraine every 5- to 6 years  . Blood clot in vein 1968  . Hepatitis age 44 or 47    had heapatitis, not sure which kind  . Heart murmur since age 58    mild  . Diverticulitis of colon with perforation 09/28/2013   Past Surgical History  Procedure Laterality Date  . Cholecystectomy  2002  . Tonsillectomy  1952  . Colon resection N/A 10/11/2013    Procedure: EXPLORATORY LAPAROTOMY WITH RECTOSIGMOID COLECTOMY AND END COLOSTOMY;  Surgeon: Stark Klein, MD;  Location: [facility];  Service: General;  Laterality: N/A;  . Colostomy takedown N/A 02/27/2014    Procedure: COLOSTOMY TAKEDOWN [name] HERNIA REPAIR;  Surgeon: Stark Klein, MD;  Location: [facility];  Service: General;  Laterality: N/A;   Family History  Problem Relation Age of Onset  . Hypertension Mother   . Aneurysm Mother   . Cancer Mother     Uterine  . Thyroid cancer Mother   . Cancer Other   . Thyroid cancer Sister   . Arthritis Sister   . Hyperlipidemia Sister   . Hypertension Sister   . Stroke Sister   . Kidney disease Sister   . Hyperlipidemia Brother   . Thyroid cancer Daughter   . Breast cancer Maternal Grandmother   . Hypertension Maternal Grandmother    History  Substance Use Topics  . Smoking status: Former Smoker -- 0.25 packs/day for 25 years    Types: Cigarettes    Quit date: 09/06/2013  . Smokeless tobacco: Never Used  . Alcohol Use: No   OB History   Grav Para Term Preterm Abortions TAB SAB Ect Mult Living                 Review of Systems  All other systems negative except as documented in the HPI. All pertinent positives and negatives as reviewed in the HPI.  Allergies  Estrogens  Home Medications   Prior to Admission medications   Medication Sig Start Date End Date Taking? Authorizing Provider  cholecalciferol (VITAMIN D) 1000 UNITS tablet Take 1 tablet (1,000 Units total) by mouth daily. 12/07/13 12/07/14 Yes Aleksei Plotnikov V, MD  hydrochlorothiazide (MICROZIDE)  12.5 MG capsule Take 1 capsule (12.5 mg total) by mouth every morning. 03/05/14  Yes Janith Lima, MD  levothyroxine (SYNTHROID, LEVOTHROID) 125 MCG tablet Take 1 tablet (125 mcg total) by mouth daily before breakfast. 03/05/14  Yes Janith Lima, MD  LORazepam (ATIVAN) 0.5 MG tablet Take 1-2 tablets (0.5-1 mg total) by mouth every 8 (eight) hours as needed for anxiety. 03/05/14  Yes Stark Klein, MD  meclizine (ANTIVERT) 12.5 MG tablet Take 12.5 mg by mouth 3 (three) times daily as needed for dizziness (pt does take one dose every morning).   Yes Historical Provider, MD  nystatin (MYCOSTATIN) 100000 UNIT/ML suspension Take 5 mLs (500,000 Units  total) by mouth 4 (four) times daily. 03/05/14  Yes Stark Klein, MD  oxyCODONE 10 MG TABS Take 1-1.5 tablets (10-15 mg total) by mouth every 4 (four) hours as needed for moderate pain, severe pain or breakthrough pain. 03/05/14  Yes Stark Klein, MD  Tiotropium Bromide Monohydrate (SPIRIVA RESPIMAT) 2.5 MCG/ACT AERS Inhale 2 puffs into the lungs daily. 01/10/14  Yes Janith Lima, MD   BP 110/82  Pulse 75  Temp(Src) 97.5 F (36.4 C) (Oral)  Resp 16  SpO2 97% Physical Exam  Nursing note and vitals reviewed. Constitutional: She is oriented to person, place, and time. She appears well-developed and well-nourished. No distress.  HENT:  Head: Normocephalic and atraumatic.  Mouth/Throat: Oropharynx is clear and moist. No oropharyngeal exudate.  Eyes: Pupils are equal, round, and reactive to light.  Neck: Normal range of motion. Neck supple.  Cardiovascular: Normal rate, regular rhythm and normal heart sounds.  Exam reveals no gallop and no friction rub.   No murmur heard. Pulmonary/Chest: Effort normal and breath sounds normal. No respiratory distress.  Abdominal: Soft. Bowel sounds are normal. She exhibits no distension. There is tenderness. There is no rebound and no guarding.    Neurological: She is alert and oriented to person, place, and time. She exhibits normal muscle tone. Coordination normal.  Skin: Skin is warm and dry. No rash noted. No erythema.    ED Course  Procedures (including critical care time) Labs Review Labs Reviewed  CBC - Abnormal; Notable for the following:    WBC 17.5 (*)    RBC 3.34 (*)    Hemoglobin 9.7 (*)    HCT 29.1 (*)    All other components within normal limits  COMPREHENSIVE METABOLIC PANEL - Abnormal; Notable for the following:    Potassium 3.1 (*)    Glucose, Bld 162 (*)    Creatinine, Ser 1.30 (*)    Albumin 3.3 (*)    ALT 39 (*)    Alkaline Phosphatase 125 (*)    GFR calc non Af Amer 41 (*)    GFR calc Af Amer 47 (*)    All other  components within normal limits  URINALYSIS, ROUTINE W REFLEX MICROSCOPIC - Abnormal; Notable for the following:    Leukocytes, UA TRACE (*)    All other components within normal limits  URINE MICROSCOPIC-ADD ON - Abnormal; Notable for the following:    Squamous Epithelial / LPF FEW (*)    All other components within normal limits  I-STAT CG4 LACTIC ACID, ED - Abnormal; Notable for the following:    Lactic Acid, Venous 3.47 (*)    All other components within normal limits  POC OCCULT BLOOD, ED - Abnormal; Notable for the following:    Fecal Occult Bld POSITIVE (*)    All other components within normal limits  Imaging Review Ct Abdomen Pelvis W Contrast  03/10/2014   CLINICAL DATA:  abd pain recent reversal of colostomy. RECTAL BLEEDING  EXAM: CT ABDOMEN AND PELVIS WITH CONTRAST  TECHNIQUE: Multidetector CT imaging of the abdomen and pelvis was performed using the standard protocol following bolus administration of intravenous contrast.  CONTRAST:  41m OMNIPAQUE IOHEXOL 300 MG/ML SOLN, 1026mOMNIPAQUE IOHEXOL 300 MG/ML SOLN  COMPARISON:  02/08/2014 and earlier studies  FINDINGS: Minimal dependent atelectasis in the visualized lung bases. Diffuse fatty infiltration of the liver. Surgical clips in the gallbladder fossa. Central intrahepatic and extrahepatic biliary ductal dilatation as before. No focal liver lesion. Unremarkable spleen, adrenal glands, pancreas. Small bilateral low-attenuation renal lesions probably cysts. No hydronephrosis. Aorta and portal vein patent.  Stomach is physiologically distended. Small bowel colon are nondilated. There is moderate fecal material in the proximal colon. Rectosigmoid is decompressed. There is circumferential wall thickening in the proximal sigmoid colon. There is a anastomotic staple line near the rectosigmoid junction. There is adjacent enhancing fluid collection in the cul-de-sac measured approximately 4.4 x 5.9 x6.1 cm , containing a few small loculated  gas bubbles. There are mild inflammatory/ edematous changes in the adjacent fat. Uterus and adnexal regions unremarkable. Urinary bladder is distended containing gas suggesting recent instrumentation. No ascites. No free air. No hydronephrosis. Degenerative disc disease L5-S1.  IMPRESSION: 1. 6.1 abscess adjacent to rectosigmoid anastomotic staple line with a few gas bubbles suggesting leak. This should be approachable for percutaneous transgluteal drainage if needed. 2. Additional ancillary findings as above.   Electronically Signed   By: DaArne Cleveland.D.   On: 03/10/2014 13:23    I spoke with Dr. ToMarlou Starksith General surgery.  He'll be in to evaluate the patient for admission and further evaluation.  Patient is stable at this time the patient is given the results, and the plan.  All questions were answered   ChBrent GeneralPA-C 03/10/14 1425

## 2014-03-10 NOTE — ED Provider Notes (Signed)
Patient reports she had a reversal of a colonoscopy done on June 16. She had had a diverticular abscess 4. She reports she started getting left lateral abdominal pain without fevers. She states she sweats when she takes her pain medications. She states her stools are still loose and have not been performed yet since her surgery. She states this morning when she wiped twice she saw blood on the tissue.  Patient has a well-healing lower midline abdominal surgical scar. She indicates her pain is to the left of the scar. She has tenderness to light touch.  Medical screening examination/treatment/procedure(s) were conducted as a shared visit with non-physician practitioner(s) and myself.  I personally evaluated the patient during the encounter.   EKG Interpretation None       Rolland Porter, MD, Abram Sander   Janice Norrie, MD 03/10/14 [phone]

## 2014-03-10 NOTE — ED Notes (Signed)
Bed: WA01 Expected date:  Expected time:  Means of arrival:  Comments: 

## 2014-03-10 NOTE — Progress Notes (Signed)
ANTIBIOTIC CONSULT NOTE - INITIAL  Pharmacy Consult for Zosyn Indication: Intra-abdominal infection  Allergies  Allergen Reactions  . Estrogens     Blood clot in leg    Patient Measurements: Height: 5' 5"  (165.1 cm) Weight: 182 lb (82.555 kg) IBW/kg (Calculated) : 57   Vital Signs: Temp: 97.6 F (36.4 C) (06/27 1728) Temp src: Oral (06/27 1728) BP: 119/67 mmHg (06/27 1728) Pulse Rate: 73 (06/27 1728) Intake/Output from previous day:   Intake/Output from this shift: Total I/O In: -  Out: 200 [Urine:200]  Labs:  Recent Labs  03/10/14 1031  WBC 17.5*  HGB 9.7*  PLT 268  CREATININE 1.30*   Estimated Creatinine Clearance: 43.3 ml/min (by C-G formula based on Cr of 1.3). No results found for this basename: VANCOTROUGH, VANCOPEAK, VANCORANDOM, GENTTROUGH, GENTPEAK, GENTRANDOM, TOBRATROUGH, TOBRAPEAK, TOBRARND, AMIKACINPEAK, AMIKACINTROU, AMIKACIN,  in the last 72 hours   Microbiology: No results found for this or any previous visit (from the past 720 hour(s)).  Medical History: Past Medical History  Diagnosis Date  . Hypertension   . Hypothyroid   . Depression   . Hypercholesteremia   . Sickle cell trait   . GERD (gastroesophageal reflux disease)   . History of blood transfusion 1965  . Diverticulitis 09/2013    with perforation  . Pericolonic abscess 2015  . Obesity   . Hypokalemia   . H/O hiatal hernia   . Arthritis     fingers  . Headache(784.0)     migraine every 5- to 6 years  . Blood clot in vein 1968  . Hepatitis age 31 or 67    had heapatitis, not sure which kind  . Heart murmur since age 43    mild  . Diverticulitis of colon with perforation 09/28/2013    Medications:  Anti-infectives   None     Assessment: 68yo F presents 12 days post colostomy takedown with abd tenderness, blood per rectum with flatus. CT showed abscess near the anastamosis. Pharmacy is asked to dose Zosyn. SCr is slightly elevated, CrCl ~ 86m/min. No history of renal  insufficiency.   Goal of Therapy:  Appropriate antibiotic dosing for renal function; eradication of infection  Plan:   Zosyn 3.375g IV Q8H infused over 4hrs.  Dose adjustment would only be necessary if SCr rises significantly to bring CrCl <286mmin.   Pharmacy will sign-off. Please re-consult usKoreaf SCr increases significantly.  Thanks.   ToRomeo RabonPharmD, pager [phone] 98756/27/2015,6:00 PM.

## 2014-03-10 NOTE — ED Provider Notes (Signed)
See prior note   Janice Norrie, MD 03/10/14 1432

## 2014-03-10 NOTE — ED Notes (Signed)
Pt went to bathroom without assistance and didn't give a urine specimen.

## 2014-03-10 NOTE — ED Notes (Signed)
Patient states she passed gas twice today and each time she had a small amount of bright red blood on the toilet tissue. Patient states she was discharged on 03/05/14 for s/p colostomy closure.

## 2014-03-11 ENCOUNTER — Inpatient Hospital Stay (HOSPITAL_COMMUNITY): Payer: Medicare Other

## 2014-03-11 ENCOUNTER — Encounter (HOSPITAL_COMMUNITY): Payer: Self-pay | Admitting: Radiology

## 2014-03-11 LAB — BASIC METABOLIC PANEL
BUN: 5 mg/dL — ABNORMAL LOW (ref 6–23)
CO2: 23 mEq/L (ref 19–32)
Calcium: 9 mg/dL (ref 8.4–10.5)
Chloride: 103 mEq/L (ref 96–112)
Creatinine, Ser: 1.22 mg/dL — ABNORMAL HIGH (ref 0.50–1.10)
GFR calc Af Amer: 51 mL/min — ABNORMAL LOW (ref >90)
GFR calc non Af Amer: 44 mL/min — ABNORMAL LOW (ref >90)
Glucose, Bld: 126 mg/dL — ABNORMAL HIGH (ref 70–99)
Potassium: 3.4 mEq/L — ABNORMAL LOW (ref 3.7–5.3)
Sodium: 140 mEq/L (ref 137–147)

## 2014-03-11 LAB — CBC
HCT: 25.9 % — ABNORMAL LOW (ref 36.0–46.0)
Hemoglobin: 8.7 g/dL — ABNORMAL LOW (ref 12.0–15.0)
MCH: 29 pg (ref 26.0–34.0)
MCHC: 33.6 g/dL (ref 30.0–36.0)
MCV: 86.3 fL (ref 78.0–100.0)
Platelets: 225 10*3/uL (ref 150–400)
RBC: 3 MIL/uL — ABNORMAL LOW (ref 3.87–5.11)
RDW: 13.6 % (ref 11.5–15.5)
WBC: 12.4 10*3/uL — ABNORMAL HIGH (ref 4.0–10.5)

## 2014-03-11 MED ORDER — KETOROLAC TROMETHAMINE 60 MG/2ML IM SOLN
INTRAMUSCULAR | Status: AC
  Administered 2014-03-11: 30 mg
  Filled 2014-03-11: qty 2

## 2014-03-11 MED ORDER — KETOROLAC TROMETHAMINE 30 MG/ML IJ SOLN
30.0000 mg | Freq: Once | INTRAMUSCULAR | Status: AC
  Filled 2014-03-11: qty 1

## 2014-03-11 MED ORDER — DIAZEPAM 5 MG/ML IJ SOLN
INTRAMUSCULAR | Status: AC
  Administered 2014-03-11: 2 mg
  Filled 2014-03-11: qty 2

## 2014-03-11 MED ORDER — MIDAZOLAM HCL 2 MG/2ML IJ SOLN
INTRAMUSCULAR | Status: AC
  Filled 2014-03-11: qty 6

## 2014-03-11 MED ORDER — FENTANYL CITRATE 0.05 MG/ML IJ SOLN
INTRAMUSCULAR | Status: AC | PRN
  Administered 2014-03-11 (×2): 50 ug via INTRAVENOUS

## 2014-03-11 MED ORDER — DIAZEPAM 5 MG/ML IJ SOLN: 2.0000 mg | Freq: Once | INTRAMUSCULAR | Status: AC

## 2014-03-11 MED ORDER — FENTANYL CITRATE 0.05 MG/ML IJ SOLN
INTRAMUSCULAR | Status: AC
  Filled 2014-03-11: qty 6

## 2014-03-11 MED ORDER — SODIUM CHLORIDE 0.9 % IJ SOLN
10.0000 mL | INTRAMUSCULAR | Status: DC | PRN
  Administered 2014-03-18 – 2014-03-21 (×3): 10 mL
  Administered 2014-03-22: 20 mL

## 2014-03-11 MED ORDER — MIDAZOLAM HCL 2 MG/2ML IJ SOLN
INTRAMUSCULAR | Status: AC | PRN
  Administered 2014-03-11 (×3): 1 mg via INTRAVENOUS

## 2014-03-11 NOTE — H&P (Signed)
Reason for Consult:Abdominal abscess, request for perc drain Consulting Radiologist: Hoss Referring Physician: Marlou Starks   HPI: Valerie Jackson is an 68 y.o. female with hx of diverticulitis with prior partial colectomy and colostomy. She recently underwent colostomy takedown and has now developed post op pain. CT scan shows abscess near anastomosis worrisome for leak. Dr. Marlou Starks has admitted pt and has asked IR to eval for perc drainage. Imaging, chart, PMHx, meds reviewed.  Past Medical History:  Past Medical History  Diagnosis Date  . Hypertension   . Hypothyroid   . Depression   . Hypercholesteremia   . Sickle cell trait   . GERD (gastroesophageal reflux disease)   . History of blood transfusion 1965  . Diverticulitis 09/2013    with perforation  . Pericolonic abscess 2015  . Obesity   . Hypokalemia   . H/O hiatal hernia   . Arthritis     fingers  . Headache(784.0)     migraine every 5- to 6 years  . Blood clot in vein 1968  . Hepatitis age 28 or 52    had heapatitis, not sure which kind  . Heart murmur since age 20    mild  . Diverticulitis of colon with perforation 09/28/2013    Surgical History:  Past Surgical History  Procedure Laterality Date  . Cholecystectomy  2002  . Tonsillectomy  1952  . Colon resection N/A 10/11/2013    Procedure: EXPLORATORY LAPAROTOMY WITH RECTOSIGMOID COLECTOMY AND END COLOSTOMY;  Surgeon: Stark Klein, MD;  Location: [facility];  Service: General;  Laterality: N/A;  . Colostomy takedown N/A 02/27/2014    Procedure: COLOSTOMY TAKEDOWN [name] HERNIA REPAIR;  Surgeon: Stark Klein, MD;  Location: [facility];  Service: General;  Laterality: N/A;    Family History:  Family History  Problem Relation Age of Onset  . Hypertension Mother   . Aneurysm Mother   . Cancer Mother     Uterine  . Thyroid cancer Mother   . Cancer Other   . Thyroid cancer Sister   . Arthritis Sister   . Hyperlipidemia Sister   . Hypertension Sister   . Stroke Sister    . Kidney disease Sister   . Hyperlipidemia Brother   . Thyroid cancer Daughter   . Breast cancer Maternal Grandmother   . Hypertension Maternal Grandmother     Social History:  reports that she quit smoking about 6 months ago. Her smoking use included Cigarettes. She has a 6.25 pack-year smoking history. She has never used smokeless tobacco. She reports that she does not drink alcohol or use illicit drugs.  Allergies:  Allergies  Allergen Reactions  . Estrogens     Blood clot in leg    Medications:  Continuous: . dextrose 5 % and 0.9 % NaCl with KCl 20 mEq/L 100 mL/hr at 03/11/14 0428    ROS: See HPI for pertinent findings, otherwise complete 10 system review negative.  Physical Exam: Blood pressure 96/66, pulse 76, temperature 98.1 F (36.7 C), temperature source Oral, resp. rate 16, height 5' 5"  (1.651 m), weight 182 lb (82.555 kg), SpO2 99.00%. GEn: NAD, nontoxic ENT: unremarkable airway Lungs: CTA without w/r/r Heart: Regular Abdomen:soft, ND, mildly tender low abd   Labs: CBC  Recent Labs  03/10/14 1031 03/11/14 0529  WBC 17.5* 12.4*  HGB 9.7* 8.7*  HCT 29.1* 25.9*  PLT 268 225   MET  Recent Labs  03/10/14 1031 03/11/14 0529  NA 137 140  K 3.1* 3.4*  CL 96  103  CO2 25 23  GLUCOSE 162* 126*  BUN 8 5*  CREATININE 1.30* 1.22*  CALCIUM 9.5 9.0    Recent Labs  03/10/14 1031  PROT 7.6  ALBUMIN 3.3*  AST 36  ALT 39*  ALKPHOS 125*  BILITOT 0.7     Ct Abdomen Pelvis W Contrast  03/10/2014   CLINICAL DATA:  abd pain recent reversal of colostomy. RECTAL BLEEDING  EXAM: CT ABDOMEN AND PELVIS WITH CONTRAST  TECHNIQUE: Multidetector CT imaging of the abdomen and pelvis was performed using the standard protocol following bolus administration of intravenous contrast.  CONTRAST:  2m OMNIPAQUE IOHEXOL 300 MG/ML SOLN, 1031mOMNIPAQUE IOHEXOL 300 MG/ML SOLN  COMPARISON:  02/08/2014 and earlier studies  FINDINGS: Minimal dependent atelectasis in the  visualized lung bases. Diffuse fatty infiltration of the liver. Surgical clips in the gallbladder fossa. Central intrahepatic and extrahepatic biliary ductal dilatation as before. No focal liver lesion. Unremarkable spleen, adrenal glands, pancreas. Small bilateral low-attenuation renal lesions probably cysts. No hydronephrosis. Aorta and portal vein patent.  Stomach is physiologically distended. Small bowel colon are nondilated. There is moderate fecal material in the proximal colon. Rectosigmoid is decompressed. There is circumferential wall thickening in the proximal sigmoid colon. There is a anastomotic staple line near the rectosigmoid junction. There is adjacent enhancing fluid collection in the cul-de-sac measured approximately 4.4 x 5.9 x6.1 cm , containing a few small loculated gas bubbles. There are mild inflammatory/ edematous changes in the adjacent fat. Uterus and adnexal regions unremarkable. Urinary bladder is distended containing gas suggesting recent instrumentation. No ascites. No free air. No hydronephrosis. Degenerative disc disease L5-S1.  IMPRESSION: 1. 6.1 abscess adjacent to rectosigmoid anastomotic staple line with a few gas bubbles suggesting leak. This should be approachable for percutaneous transgluteal drainage if needed. 2. Additional ancillary findings as above.   Electronically Signed   By: DaArne Cleveland.D.   On: 03/10/2014 13:23    Assessment/Plan: Pelvic abscess adjacent to anastomosis from recent colostomy takedown. Concern for leak Plan for CT guided perc drainage today Explained procedure, risks, complications, use of sedation. Labs reviewed. Consent signed in chart  BRAscencion DikeA-C 03/11/2014, 8:03 AM

## 2014-03-11 NOTE — Progress Notes (Signed)
Subjective: Still with LLQ pain.  Objective: Vital signs in last 24 hours: Temp:  [97.5 F (36.4 C)-98.2 F (36.8 C)] 98.1 F (36.7 C) (06/28 0549) Pulse Rate:  [73-84] 76 (06/28 0549) Resp:  [14-18] 16 (06/28 0549) BP: (96-135)/(61-82) 96/66 mmHg (06/28 0549) SpO2:  [96 %-100 %] 96 % (06/28 0809) Weight:  [182 lb (82.555 kg)] 182 lb (82.555 kg) (06/27 1728) Last BM Date: 03/10/14  Intake/Output from previous day: 06/27 0701 - 06/28 0700 In: 1218.3 [I.V.:1168.3; IV Piggyback:50] Out: 200 [Urine:200] Intake/Output this shift:    PE: General- In NAD Abdomen-soft, wounds clean, tender in lower abdomen  Lab Results:   Recent Labs  03/10/14 1031 03/11/14 0529  WBC 17.5* 12.4*  HGB 9.7* 8.7*  HCT 29.1* 25.9*  PLT 268 225   BMET  Recent Labs  03/10/14 1031 03/11/14 0529  NA 137 140  K 3.1* 3.4*  CL 96 103  CO2 25 23  GLUCOSE 162* 126*  BUN 8 5*  CREATININE 1.30* 1.22*  CALCIUM 9.5 9.0   PT/INR No results found for this basename: LABPROT, INR,  in the last 72 hours Comprehensive Metabolic Panel:    Component Value Date/Time   NA 140 03/11/2014 0529   NA 137 03/10/2014 1031   K 3.4* 03/11/2014 0529   K 3.1* 03/10/2014 1031   CL 103 03/11/2014 0529   CL 96 03/10/2014 1031   CO2 23 03/11/2014 0529   CO2 25 03/10/2014 1031   BUN 5* 03/11/2014 0529   BUN 8 03/10/2014 1031   CREATININE 1.22* 03/11/2014 0529   CREATININE 1.30* 03/10/2014 1031   GLUCOSE 126* 03/11/2014 0529   GLUCOSE 162* 03/10/2014 1031   CALCIUM 9.0 03/11/2014 0529   CALCIUM 9.5 03/10/2014 1031   AST 36 03/10/2014 1031   AST 20 02/21/2014 1110   ALT 39* 03/10/2014 1031   ALT 29 02/21/2014 1110   ALKPHOS 125* 03/10/2014 1031   ALKPHOS 92 02/21/2014 1110   BILITOT 0.7 03/10/2014 1031   BILITOT 0.5 02/21/2014 1110   PROT 7.6 03/10/2014 1031   PROT 7.9 02/21/2014 1110   ALBUMIN 3.3* 03/10/2014 1031   ALBUMIN 3.9 02/21/2014 1110     Studies/Results: Ct Abdomen Pelvis W Contrast  03/10/2014   CLINICAL  DATA:  abd pain recent reversal of colostomy. RECTAL BLEEDING  EXAM: CT ABDOMEN AND PELVIS WITH CONTRAST  TECHNIQUE: Multidetector CT imaging of the abdomen and pelvis was performed using the standard protocol following bolus administration of intravenous contrast.  CONTRAST:  59m OMNIPAQUE IOHEXOL 300 MG/ML SOLN, 1032mOMNIPAQUE IOHEXOL 300 MG/ML SOLN  COMPARISON:  02/08/2014 and earlier studies  FINDINGS: Minimal dependent atelectasis in the visualized lung bases. Diffuse fatty infiltration of the liver. Surgical clips in the gallbladder fossa. Central intrahepatic and extrahepatic biliary ductal dilatation as before. No focal liver lesion. Unremarkable spleen, adrenal glands, pancreas. Small bilateral low-attenuation renal lesions probably cysts. No hydronephrosis. Aorta and portal vein patent.  Stomach is physiologically distended. Small bowel colon are nondilated. There is moderate fecal material in the proximal colon. Rectosigmoid is decompressed. There is circumferential wall thickening in the proximal sigmoid colon. There is a anastomotic staple line near the rectosigmoid junction. There is adjacent enhancing fluid collection in the cul-de-sac measured approximately 4.4 x 5.9 x6.1 cm , containing a few small loculated gas bubbles. There are mild inflammatory/ edematous changes in the adjacent fat. Uterus and adnexal regions unremarkable. Urinary bladder is distended containing gas suggesting recent instrumentation. No ascites. No free air. No  hydronephrosis. Degenerative disc disease L5-S1.  IMPRESSION: 1. 6.1 abscess adjacent to rectosigmoid anastomotic staple line with a few gas bubbles suggesting leak. This should be approachable for percutaneous transgluteal drainage if needed. 2. Additional ancillary findings as above.   Electronically Signed   By: Arne Cleveland M.D.   On: 03/10/2014 13:23    Anti-infectives: Anti-infectives   Start     Dose/Rate Route Frequency Ordered Stop   03/10/14 2000   piperacillin-tazobactam (ZOSYN) IVPB 3.375 g     3.375 g 12.5 mL/hr over 240 Minutes Intravenous 3 times per day 03/10/14 1802        Assessment Principal Problem:   Pelvic abscess-adjacent to anastomotic staple line. Active Problems:   COPD (chronic obstructive pulmonary disease) with emphysema   Parastomal hernia s/p colostomy takedown & repair 02/27/2014   CKD (chronic kidney disease) stage 2, GFR 60-89 ml/min    LOS: 1 day   Plan: Continue IV abxs.  IR to drain abscess today.   ROSENBOWER,TODD Lenna Sciara 03/11/2014

## 2014-03-11 NOTE — Progress Notes (Signed)
Peripherally Inserted Central Catheter/Midline Placement  The IV Nurse has discussed with the patient and/or persons authorized to consent for the patient, the purpose of this procedure and the potential benefits and risks involved with this procedure.  The benefits include less needle sticks, lab draws from the catheter and patient may be discharged home with the catheter.  Risks include, but not limited to, infection, bleeding, blood clot (thrombus formation), and puncture of an artery; nerve damage and irregular heat beat.  Alternatives to this procedure were also discussed.  PICC/Midline Placement Documentation  PICC / Midline Single Lumen 46/19/01 PICC Right Basilic 37 cm 0 cm (Active)  Indication for Insertion or Continuance of Line Home intravenous therapies (PICC only);Limited venous access - need for IV therapy >5 days (PICC only) 03/11/2014  5:17 PM  Exposed Catheter (cm) 0 cm 03/11/2014  5:17 PM  Site Assessment Clean;Dry;Intact 03/11/2014  5:17 PM  Line Status Flushed;Saline locked;Blood return noted 03/11/2014  5:17 PM  Dressing Type Transparent 03/11/2014  5:17 PM  Dressing Status Clean;Dry;Intact;Antimicrobial disc in place 03/11/2014  5:17 PM  Line Care Connections checked and tightened 03/11/2014  5:17 PM  Dressing Intervention New dressing 03/11/2014  5:17 PM  Dressing Change Due 03/18/14 03/11/2014  5:17 PM       Rolena Infante 03/11/2014, 5:21 PM

## 2014-03-11 NOTE — Progress Notes (Signed)
Pt reported decrease in rt buttock and radiating pain. Alert with vital signs wnl-see flow sheet. Continues to deny numbness or tingling in RLE. Neurovascular checks wnl. Able to wiggle toes and move foot without increased pain. IV team in to start PICC line.

## 2014-03-11 NOTE — Progress Notes (Signed)
Pt's pain between 3-4. Able to move entire RLE without increased pain. Neurovascular checks remain wnl.

## 2014-03-11 NOTE — Procedures (Signed)
R transgluteal 12 Fr pelvic abscess drain Find - dark fluid No comp

## 2014-03-11 NOTE — Progress Notes (Addendum)
Dr. Zella Richer aware via phone pt complains of severe pain in rt buttock radiating into rt leg and rt foot. No numbness or tingling noted with good pedal pulses. Purposeful movement of rt toes. See new orders received.

## 2014-03-12 MED ORDER — GABAPENTIN 600 MG PO TABS
600.0000 mg | ORAL_TABLET | Freq: Every day | ORAL | Status: DC
  Filled 2014-03-12: qty 1

## 2014-03-12 MED ORDER — GABAPENTIN 300 MG PO CAPS
600.0000 mg | ORAL_CAPSULE | Freq: Every day | ORAL | Status: DC
  Administered 2014-03-12 – 2014-03-15 (×4): 600 mg via ORAL
  Filled 2014-03-12 (×11): qty 2

## 2014-03-12 NOTE — Progress Notes (Signed)
During patient rounds, patient expresses concern over discharge plan.  Feels that she is not progressing, extreme pain with movement, 1-person assist.  Patient feels that she will not have enough help at home or with a home health nurse. CM notified, agreed to speak with patient further about concerns.

## 2014-03-12 NOTE — Progress Notes (Signed)
Subjective: Patient states she is in a lot of pain after drain placement. She states yesterday she was in so much pain it was difficult to move RLE. She states today she is able to move RLE and denies any loss of sensation, temperature or motor function. She c/o pain in her right buttock region down the posterior thigh to her ankle worse with movement.   Objective: Physical Exam: BP 119/69  Pulse 84  Temp(Src) 98.2 F (36.8 C) (Oral)  Resp 18  Ht 5' 5"  (1.651 m)  Wt 182 lb (82.555 kg)  BMI 30.29 kg/m2  SpO2 97%  General: A&Ox3, appears in pain with movement Abd: Soft, ND Right TG perc drain dressing C/D/I, no signs of leaking or bleeding. Tenderness at site, 24 hour output 30 cc thin brown in color, Cx gram negative rods/pending, RLE neurologically intact, DP intact bilaterally 2+, ext warm bilaterally, intact ROM bilaterally.   Labs: CBC  Recent Labs  03/10/14 1031 03/11/14 0529  WBC 17.5* 12.4*  HGB 9.7* 8.7*  HCT 29.1* 25.9*  PLT 268 225   BMET  Recent Labs  03/10/14 1031 03/11/14 0529  NA 137 140  K 3.1* 3.4*  CL 96 103  CO2 25 23  GLUCOSE 162* 126*  BUN 8 5*  CREATININE 1.30* 1.22*  CALCIUM 9.5 9.0   LFT  Recent Labs  03/10/14 1031  PROT 7.6  ALBUMIN 3.3*  AST 36  ALT 39*  ALKPHOS 125*  BILITOT 0.7   PT/INR No results found for this basename: LABPROT, INR,  in the last 72 hours   Studies/Results: Ct Abdomen Pelvis W Contrast  03/10/2014   CLINICAL DATA:  abd pain recent reversal of colostomy. RECTAL BLEEDING  EXAM: CT ABDOMEN AND PELVIS WITH CONTRAST  TECHNIQUE: Multidetector CT imaging of the abdomen and pelvis was performed using the standard protocol following bolus administration of intravenous contrast.  CONTRAST:  43m OMNIPAQUE IOHEXOL 300 MG/ML SOLN, 1026mOMNIPAQUE IOHEXOL 300 MG/ML SOLN  COMPARISON:  02/08/2014 and earlier studies  FINDINGS: Minimal dependent atelectasis in the visualized lung bases. Diffuse fatty infiltration of the  liver. Surgical clips in the gallbladder fossa. Central intrahepatic and extrahepatic biliary ductal dilatation as before. No focal liver lesion. Unremarkable spleen, adrenal glands, pancreas. Small bilateral low-attenuation renal lesions probably cysts. No hydronephrosis. Aorta and portal vein patent.  Stomach is physiologically distended. Small bowel colon are nondilated. There is moderate fecal material in the proximal colon. Rectosigmoid is decompressed. There is circumferential wall thickening in the proximal sigmoid colon. There is a anastomotic staple line near the rectosigmoid junction. There is adjacent enhancing fluid collection in the cul-de-sac measured approximately 4.4 x 5.9 x6.1 cm , containing a few small loculated gas bubbles. There are mild inflammatory/ edematous changes in the adjacent fat. Uterus and adnexal regions unremarkable. Urinary bladder is distended containing gas suggesting recent instrumentation. No ascites. No free air. No hydronephrosis. Degenerative disc disease L5-S1.  IMPRESSION: 1. 6.1 abscess adjacent to rectosigmoid anastomotic staple line with a few gas bubbles suggesting leak. This should be approachable for percutaneous transgluteal drainage if needed. 2. Additional ancillary findings as above.   Electronically Signed   By: DaArne Cleveland.D.   On: 03/10/2014 13:23   Ct Image Guided Drainage Percut Cath  Peritoneal Retroperit  03/11/2014   CLINICAL DATA:  Pelvic abscess  EXAM: RADIOLOGY EXAMINATION  FLUOROSCOPY TIME:  None  MEDICATIONS AND MEDICAL HISTORY: Versed two mg, Fentanyl 1 on mcg.  Additional Medications: None.  ANESTHESIA/SEDATION: Moderate  sedation time: 20 minutes  CONTRAST:  None  PROCEDURE: The procedure, risks, benefits, and alternatives were explained to the patient. Questions regarding the procedure were encouraged and answered. The patient understands and consents to the procedure.  The right gluteal region was prepped with Betadine in a sterile  fashion, and a sterile drape was applied covering the operative field. A sterile gown and sterile gloves were used for the procedure.  Under CT guidance, an 18 gauge needle was inserted into the pelvic abscess via right trans gluteal approach. It was removed over an Amplatz. A 12 French dilator followed by a 12 [place] drain was inserted and looped in the fluid collection. It was string fixed and sewn to the skin. Bloody fluid was aspirated.  FINDINGS: Images document 74 French drain placement into a pelvic abscess.  COMPLICATIONS: None  IMPRESSION: Successful pelvic abscess strain.   Electronically Signed   By: Maryclare Bean M.D.   On: 03/11/2014 11:33    Assessment/Plan: Pelvic abscess adjacent to anastomosis from recent colostomy takedown. Concern for leak S/p 40F right transgluteal drain placement, Cx gram negative rods/pending, pain at site and RLE, neurologically and motor function intact, pulses intact.  Continue to monitor output and flush TID Plans per CCS    LOS: 2 days    Hedy Jacob PA-C 03/12/2014 11:05 AM

## 2014-03-12 NOTE — Care Management Note (Addendum)
    Page 1 of 2   03/20/2014     2:47:15 PM CARE MANAGEMENT NOTE 03/20/2014  Patient:  Valerie Jackson, Valerie Jackson   Account Number:  [id number]  Date Initiated:  03/12/2014  Documentation initiated by:  Sunday Spillers  Subjective/Objective Assessment:   68 yo female admitted s/p I&D of abscess for post op infection. PTA lived at home with daughter.     Action/Plan:   patient requesting SNF for IV abx and rehab   Anticipated DC Date:  03/21/2014   Anticipated DC Plan:  SKILLED NURSING FACILITY  In-house referral  Clinical Social Worker      DC Forensic scientist  CM consult      [facility] Choice  HOME HEALTH   Choice offered to / List presented to:  C-1 Patient        [place] arranged  HH-1 RN  [place].   Status of service:  Completed, signed off Medicare Important Message given?  YES (If response is "NO", the following Medicare IM given date fields will be blank) Date Medicare IM given:  03/16/2014 Medicare IM given by:  Olga Coaster Date Additional Medicare IM given:   Additional Medicare IM given by:    Discharge Disposition:  [place]  Per UR Regulation:  Reviewed for med. necessity/level of care/duration of stay  If discussed at [place] of Stay Meetings, dates discussed:    Comments:  03-20-14 Sunday Spillers RN CM 1400 Long discussion at bedside regarding d/c options. Patient interested in SNF, LTAC, and Home options. Discussed differences in the level of care with each option, patient concerned about cost and support. Patient is reviewing SNF offers, arranged to have Pam from [facility] to teach about [place] and TNA, contacted [facility] regarding LTACH and they will accept with TNA and abx. Will f/u with patient to see what her decision is.

## 2014-03-12 NOTE — Progress Notes (Signed)
Patient ID: Valerie Jackson, female   DOB: 15-Apr-1946, 68 y.o.   MRN: [id number]    Subjective: LLQ pain better, but feels shooting pain down back of right leg starting at drain site.  N/v improved.    Objective: Vital signs in last 24 hours: Temp:  [97.5 F (36.4 C)-98.4 F (36.9 C)] 98.2 F (36.8 C) (06/29 1000) Pulse Rate:  [62-84] 84 (06/29 1000) Resp:  [16-18] 18 (06/29 1000) BP: (103-125)/(65-80) 119/69 mmHg (06/29 1000) SpO2:  [94 %-99 %] 97 % (06/29 1000) Last BM Date: 03/10/14  Intake/Output from previous day: 06/28 0701 - 06/29 0700 In: 2610 [I.V.:2400; IV Piggyback:200] Out: 1020 [Urine:1000; Drains:20] Intake/Output this shift: Total I/O In: 240 [P.O.:240] Out: 0   PE: General- In NAD Abdomen-soft, wounds clean, tender in lower abdomen  Lab Results:   Recent Labs  03/10/14 1031 03/11/14 0529  WBC 17.5* 12.4*  HGB 9.7* 8.7*  HCT 29.1* 25.9*  PLT 268 225   BMET  Recent Labs  03/10/14 1031 03/11/14 0529  NA 137 140  K 3.1* 3.4*  CL 96 103  CO2 25 23  GLUCOSE 162* 126*  BUN 8 5*  CREATININE 1.30* 1.22*  CALCIUM 9.5 9.0   PT/INR No results found for this basename: LABPROT, INR,  in the last 72 hours Comprehensive Metabolic Panel:    Component Value Date/Time   NA 140 03/11/2014 0529   NA 137 03/10/2014 1031   K 3.4* 03/11/2014 0529   K 3.1* 03/10/2014 1031   CL 103 03/11/2014 0529   CL 96 03/10/2014 1031   CO2 23 03/11/2014 0529   CO2 25 03/10/2014 1031   BUN 5* 03/11/2014 0529   BUN 8 03/10/2014 1031   CREATININE 1.22* 03/11/2014 0529   CREATININE 1.30* 03/10/2014 1031   GLUCOSE 126* 03/11/2014 0529   GLUCOSE 162* 03/10/2014 1031   CALCIUM 9.0 03/11/2014 0529   CALCIUM 9.5 03/10/2014 1031   AST 36 03/10/2014 1031   AST 20 02/21/2014 1110   ALT 39* 03/10/2014 1031   ALT 29 02/21/2014 1110   ALKPHOS 125* 03/10/2014 1031   ALKPHOS 92 02/21/2014 1110   BILITOT 0.7 03/10/2014 1031   BILITOT 0.5 02/21/2014 1110   PROT 7.6 03/10/2014 1031   PROT 7.9  02/21/2014 1110   ALBUMIN 3.3* 03/10/2014 1031   ALBUMIN 3.9 02/21/2014 1110     Studies/Results: Ct Image Guided Drainage Percut Cath  Peritoneal Retroperit  03/11/2014   CLINICAL DATA:  Pelvic abscess  EXAM: RADIOLOGY EXAMINATION  FLUOROSCOPY TIME:  None  MEDICATIONS AND MEDICAL HISTORY: Versed two mg, Fentanyl 1 on mcg.  Additional Medications: None.  ANESTHESIA/SEDATION: Moderate sedation time: 20 minutes  CONTRAST:  None  PROCEDURE: The procedure, risks, benefits, and alternatives were explained to the patient. Questions regarding the procedure were encouraged and answered. The patient understands and consents to the procedure.  The right gluteal region was prepped with Betadine in a sterile fashion, and a sterile drape was applied covering the operative field. A sterile gown and sterile gloves were used for the procedure.  Under CT guidance, an 18 gauge needle was inserted into the pelvic abscess via right trans gluteal approach. It was removed over an Amplatz. A 12 French dilator followed by a 12 [place] drain was inserted and looped in the fluid collection. It was string fixed and sewn to the skin. Bloody fluid was aspirated.  FINDINGS: Images document 66 French drain placement into a pelvic abscess.  COMPLICATIONS: None  IMPRESSION: Successful  pelvic abscess strain.   Electronically Signed   By: Maryclare Bean M.D.   On: 03/11/2014 11:33    Anti-infectives: Anti-infectives   Start     Dose/Rate Route Frequency Ordered Stop   03/10/14 2000  piperacillin-tazobactam (ZOSYN) IVPB 3.375 g     3.375 g 12.5 mL/hr over 240 Minutes Intravenous 3 times per day 03/10/14 1802        Assessment Principal Problem:   Pelvic abscess-continue drain, antibiotics, clear liquids.   Active Problems:   COPD (chronic obstructive pulmonary disease) with emphysema   Parastomal hernia s/p colostomy takedown & repair 02/27/2014   CKD (chronic kidney disease) stage 2, GFR 60-89 ml/min May need SNF.  Will go ahead and  get SW consult.     LOS: 2 days      [facility] 03/12/2014

## 2014-03-13 MED ORDER — POTASSIUM CHLORIDE 10 MEQ/100ML IV SOLN
10.0000 meq | INTRAVENOUS | Status: AC
  Administered 2014-03-13 (×4): 10 meq via INTRAVENOUS
  Filled 2014-03-13 (×4): qty 100

## 2014-03-13 NOTE — Progress Notes (Signed)
  Subjective: Pt still with discomfort at rt TG drain site with associated radiculopathy/RLE pain and unable to bear sig weight on rt leg.   Objective: Vital signs in last 24 hours: Temp:  [97.9 F (36.6 C)-98.3 F (36.8 C)] 97.9 F (36.6 C) (06/30 0522) Pulse Rate:  [78-85] 78 (06/30 0522) Resp:  [18] 18 (06/30 0522) BP: (123-129)/(74-84) 129/84 mmHg (06/30 0522) SpO2:  [97 %-99 %] 99 % (06/30 0522) Last BM Date: 03/12/14  Intake/Output from previous day: 06/29 0701 - 06/30 0700 In: 3790 [P.O.:1320; I.V.:2400; IV Piggyback:50] Out: 1920 [Urine:1900; Drains:20] Intake/Output this shift: Total I/O In: 1146.7 [P.O.:240; I.V.:601.7; Other:5; IV Piggyback:300] Out: 22 [Urine:850; Drains:10]  Rt TG drain intact, output minimal but appears feculent; drain irrigated with 10 cc's sterile NS with return of feculent appearing liquid and air; mod tender at drain site but no sig erythema; pt has some dullness to sensation of RLE and pain with dorsiflexion/plantarfexion of rt foot  Lab Results:   Recent Labs  03/11/14 0529  WBC 12.4*  HGB 8.7*  HCT 25.9*  PLT 225   BMET  Recent Labs  03/11/14 0529  NA 140  K 3.4*  CL 103  CO2 23  GLUCOSE 126*  BUN 5*  CREATININE 1.22*  CALCIUM 9.0   PT/INR No results found for this basename: LABPROT, INR,  in the last 72 hours ABG No results found for this basename: PHART, PCO2, PO2, HCO3,  in the last 72 hours  Studies/Results: No results found.  Anti-infectives: Anti-infectives   Start     Dose/Rate Route Frequency Ordered Stop   03/10/14 2000  piperacillin-tazobactam (ZOSYN) IVPB 3.375 g     3.375 g 12.5 mL/hr over 240 Minutes Intravenous 3 times per day 03/10/14 1802        Assessment/Plan: s/p pelvic abscess drainage via rt TG approach 6/28; pt has persistent sciatic nerve irritation from drain which is sig limiting her mobility; case d/w Drs. Yamagata/Byerly and decision made to remove drain at this time. Rt TG drain  removed in its entirety without immediate complications- pt did experience a mod amt of pain during removal and IV morphine was administered. Gauze dressing was applied over site. Follow up CT scan at discretion of CCS.  LOS: 3 days    ALLRED,D [facility] 03/13/2014

## 2014-03-13 NOTE — Progress Notes (Signed)
Agree.  Patient was not tolerating right TG drain.

## 2014-03-13 NOTE — Progress Notes (Signed)
Patient ID: Valerie Jackson, female   DOB: [birth year], 68 y.o.   MRN: [id number]    Subjective: Despite neurontin, pt having horrible sciatica.  Much less tender abdomen.      Objective: Vital signs in last 24 hours: Temp:  [97.9 F (36.6 C)-98.6 F (37 C)] 97.9 F (36.6 C) (06/30 0522) Pulse Rate:  [78-85] 78 (06/30 0522) Resp:  [18] 18 (06/30 0522) BP: (113-129)/(70-84) 129/84 mmHg (06/30 0522) SpO2:  [97 %-99 %] 99 % (06/30 0522) Last BM Date: 03/12/14  Intake/Output from previous day: 06/29 0701 - 06/30 0700 In: 3790 [P.O.:1320; I.V.:2400; IV Piggyback:50] Out: 1920 [Urine:1900; Drains:20] Intake/Output this shift: Total I/O In: 915 [P.O.:240; I.V.:400; Other:5] Out: 76 [Urine:850; Drains:10]  PE: General- In NAD Abdomen-soft, wounds clean, tender in lower abdomen.  Drain looks like stool.    Lab Results:   Recent Labs  03/11/14 0529  WBC 12.4*  HGB 8.7*  HCT 25.9*  PLT 225   BMET  Recent Labs  03/11/14 0529  NA 140  K 3.4*  CL 103  CO2 23  GLUCOSE 126*  BUN 5*  CREATININE 1.22*  CALCIUM 9.0   PT/INR No results found for this basename: LABPROT, INR,  in the last 72 hours Comprehensive Metabolic Panel:    Component Value Date/Time   NA 140 03/11/2014 0529   NA 137 03/10/2014 1031   K 3.4* 03/11/2014 0529   K 3.1* 03/10/2014 1031   CL 103 03/11/2014 0529   CL 96 03/10/2014 1031   CO2 23 03/11/2014 0529   CO2 25 03/10/2014 1031   BUN 5* 03/11/2014 0529   BUN 8 03/10/2014 1031   CREATININE 1.22* 03/11/2014 0529   CREATININE 1.30* 03/10/2014 1031   GLUCOSE 126* 03/11/2014 0529   GLUCOSE 162* 03/10/2014 1031   CALCIUM 9.0 03/11/2014 0529   CALCIUM 9.5 03/10/2014 1031   AST 36 03/10/2014 1031   AST 20 02/21/2014 1110   ALT 39* 03/10/2014 1031   ALT 29 02/21/2014 1110   ALKPHOS 125* 03/10/2014 1031   ALKPHOS 92 02/21/2014 1110   BILITOT 0.7 03/10/2014 1031   BILITOT 0.5 02/21/2014 1110   PROT 7.6 03/10/2014 1031   PROT 7.9 02/21/2014 1110   ALBUMIN 3.3* 03/10/2014  1031   ALBUMIN 3.9 02/21/2014 1110     Studies/Results: No results found.  Anti-infectives: Anti-infectives   Start     Dose/Rate Route Frequency Ordered Stop   03/10/14 2000  piperacillin-tazobactam (ZOSYN) IVPB 3.375 g     3.375 g 12.5 mL/hr over 240 Minutes Intravenous 3 times per day 03/10/14 1802        Assessment Principal Problem:   Pelvic abscess- discuss drain with IR.  May need to pull vs reposition.   Active Problems:   COPD (chronic obstructive pulmonary disease) with emphysema   Parastomal hernia s/p colostomy takedown & repair 02/27/2014   CKD (chronic kidney disease) stage 2, GFR 60-89 ml/min May need SNF.  Will go ahead and get SW consult.     LOS: 3 days      BYERLY,FAERA 03/13/2014

## 2014-03-13 NOTE — Progress Notes (Signed)
Clinical Social Work Department CLINICAL SOCIAL WORK PLACEMENT NOTE 03/13/2014  Patient:  Valerie Jackson, Valerie Jackson  Account Number:  [id number] Admit date:  03/10/2014  Clinical Social Worker:  Werner Lean, LCSW  Date/time:  03/13/2014 09:00 AM  Clinical Social Work is seeking post-discharge placement for this patient at the following level of care:   [place]   (*CSW will update this form in Epic as items are completed)   03/13/2014  Patient/family provided with [place] Department of Clinical Social Work's list of facilities offering this level of care within the geographic area requested by the patient (or if unable, by the patient's family).  03/13/2014  Patient/family informed of their freedom to choose among providers that offer the needed level of care, that participate in Medicare, Medicaid or managed care program needed by the patient, have an available bed and are willing to accept the patient.    Patient/family informed of [facility]' ownership interest in [facility], as well as of the fact that they are under no obligation to receive care at this facility.  PASARR submitted to EDS on 03/13/2014 PASARR number received on 03/13/2014  FL2 transmitted to all facilities in geographic area requested by pt/family on  03/13/2014 FL2 transmitted to all facilities within larger geographic area on   Patient informed that his/her managed care company has contracts with or will negotiate with  certain facilities, including the following:     Patient/family informed of bed offers received:  03/13/2014 Patient chooses bed at  Physician recommends and patient chooses bed at    Patient to be transferred to  on   Patient to be transferred to facility by  Patient and family notified of transfer on  Name of family member notified:    The following physician request were entered in Epic:   Additional Comments:  Werner Lean LCSW [phone]

## 2014-03-13 NOTE — Progress Notes (Signed)
Clinical Social Work Department BRIEF PSYCHOSOCIAL ASSESSMENT 03/13/2014  Patient:  Valerie Jackson, Valerie Jackson     Account Number:  [id number]     Admit date:  03/10/2014  Clinical Social Worker:  Lacie Scotts  Date/Time:  03/13/2014 08:51 AM  Referred by:  RN  Date Referred:  03/12/2014 Referred for  SNF Placement   Other Referral:   Interview type:  Patient Other interview type:    PSYCHOSOCIAL DATA Living Status:  FAMILY Admitted from facility:   Level of care:   Primary support name:  Danie Binder Primary support relationship to patient:  CHILD, ADULT Degree of support available:   supportive    CURRENT CONCERNS Current Concerns  Post-Acute Placement   Other Concerns:    SOCIAL WORK ASSESSMENT / PLAN Pt is a 68 yr old female living at home with her daughter prior to hospitalization. CSW met with pt to assist with d/c planning. ST Rehab is needed following hospital d/c . Pt is in agreement with this plan. SNF search has been initiated and bed offer provided. CSW will continue to follow to assist with d/c planning.   Assessment/plan status:  Psychosocial Support/Ongoing Assessment of Needs Other assessment/ plan:   Information/referral to community resources:   Insurance coverage for SNF and ambulance transport reviewed.    PATIENT'S/FAMILY'S RESPONSE TO PLAN OF CARE: Pt is comfortable going home with IV meds but is complaining of pain and difficulty with ambulation. She feels ST Rehab will be helpful. Pt appreciates assistance from [place].   Werner Lean LCSW [phone]

## 2014-03-14 LAB — CULTURE, ROUTINE-ABSCESS

## 2014-03-14 NOTE — Plan of Care (Signed)
Problem: Phase II Progression Outcomes Goal: IV changed to normal saline lock Outcome: Not Progressing Pt IV fluids rate decreased today.

## 2014-03-14 NOTE — Progress Notes (Signed)
Patient ID: Valerie Jackson, female   DOB: [birth year], 68 y.o.   MRN: [id number]    Subjective: Drain pulled for sciatic irritation.  That is better now.  Having a little cramping in abdomen.       Objective: Vital signs in last 24 hours: Temp:  [98.3 F (36.8 C)-99.9 F (37.7 C)] 99.9 F (37.7 C) (07/01 0625) Pulse Rate:  [81-96] 96 (07/01 0625) Resp:  [18] 18 (07/01 0625) BP: (110-154)/(75-84) 110/75 mmHg (07/01 0625) SpO2:  [97 %-100 %] 97 % (07/01 0625) Last BM Date: 03/13/14  Intake/Output from previous day: 06/30 0701 - 07/01 0700 In: 3376.7 [P.O.:720; I.V.:2201.7; IV Piggyback:450] Out: 2910 [Urine:2900; Drains:10] Intake/Output this shift:    PE: General- In NAD Abdomen-soft, wounds clean, very mild tenderness in lower abdomen.    Lab Results:  No results found for this basename: WBC, HGB, HCT, PLT,  in the last 72 hours BMET No results found for this basename: NA, K, CL, CO2, GLUCOSE, BUN, CREATININE, CALCIUM,  in the last 72 hours PT/INR No results found for this basename: LABPROT, INR,  in the last 72 hours Comprehensive Metabolic Panel:    Component Value Date/Time   NA 140 03/11/2014 0529   NA 137 03/10/2014 1031   K 3.4* 03/11/2014 0529   K 3.1* 03/10/2014 1031   CL 103 03/11/2014 0529   CL 96 03/10/2014 1031   CO2 23 03/11/2014 0529   CO2 25 03/10/2014 1031   BUN 5* 03/11/2014 0529   BUN 8 03/10/2014 1031   CREATININE 1.22* 03/11/2014 0529   CREATININE 1.30* 03/10/2014 1031   GLUCOSE 126* 03/11/2014 0529   GLUCOSE 162* 03/10/2014 1031   CALCIUM 9.0 03/11/2014 0529   CALCIUM 9.5 03/10/2014 1031   AST 36 03/10/2014 1031   AST 20 02/21/2014 1110   ALT 39* 03/10/2014 1031   ALT 29 02/21/2014 1110   ALKPHOS 125* 03/10/2014 1031   ALKPHOS 92 02/21/2014 1110   BILITOT 0.7 03/10/2014 1031   BILITOT 0.5 02/21/2014 1110   PROT 7.6 03/10/2014 1031   PROT 7.9 02/21/2014 1110   ALBUMIN 3.3* 03/10/2014 1031   ALBUMIN 3.9 02/21/2014 1110     Studies/Results: No results  found.  Anti-infectives: Anti-infectives   Start     Dose/Rate Route Frequency Ordered Stop   03/10/14 2000  piperacillin-tazobactam (ZOSYN) IVPB 3.375 g     3.375 g 12.5 mL/hr over 240 Minutes Intravenous 3 times per day 03/10/14 1802        Assessment Principal Problem:   Pelvic abscess- Depending on symptoms, labs, vitals, may need repeat scan tomorrow and +/- repeat drain in different position.   Active Problems:   COPD (chronic obstructive pulmonary disease) with emphysema   Parastomal hernia s/p colostomy takedown & repair 02/27/2014   CKD (chronic kidney disease) stage 2, GFR 60-89 ml/min Try full liquids today. Recheck labs in AM.   LOS: 4 days      Valerie Jackson 03/14/2014

## 2014-03-15 LAB — CBC
HCT: 24.6 % — ABNORMAL LOW (ref 36.0–46.0)
Hemoglobin: 8.3 g/dL — ABNORMAL LOW (ref 12.0–15.0)
MCH: 29.4 pg (ref 26.0–34.0)
MCHC: 33.7 g/dL (ref 30.0–36.0)
MCV: 87.2 fL (ref 78.0–100.0)
Platelets: 282 10*3/uL (ref 150–400)
RBC: 2.82 MIL/uL — ABNORMAL LOW (ref 3.87–5.11)
RDW: 13.4 % (ref 11.5–15.5)
WBC: 8.3 10*3/uL (ref 4.0–10.5)

## 2014-03-15 LAB — BASIC METABOLIC PANEL
Anion gap: 13 (ref 5–15)
BUN: 3 mg/dL — ABNORMAL LOW (ref 6–23)
CO2: 25 mEq/L (ref 19–32)
Calcium: 9.3 mg/dL (ref 8.4–10.5)
Chloride: 104 mEq/L (ref 96–112)
Creatinine, Ser: 1.21 mg/dL — ABNORMAL HIGH (ref 0.50–1.10)
GFR calc Af Amer: 52 mL/min — ABNORMAL LOW (ref >90)
GFR calc non Af Amer: 45 mL/min — ABNORMAL LOW (ref >90)
Glucose, Bld: 97 mg/dL (ref 70–99)
Potassium: 3.2 mEq/L — ABNORMAL LOW (ref 3.7–5.3)
Sodium: 142 mEq/L (ref 137–147)

## 2014-03-15 NOTE — Progress Notes (Signed)
Patient ID: Valerie Jackson, female   DOB: 1946/02/16, 68 y.o.   MRN: [id number]    Subjective: Pt tolerated full liquids.  However, she is having night sweats.  She also feels like she has to urinate when she has a BM and needs to have a BM when she has to urinate.        Objective: Vital signs in last 24 hours: Temp:  [98 F (36.7 C)-99 F (37.2 C)] 98.4 F (36.9 C) (07/02 0611) Pulse Rate:  [82-88] 82 (07/02 0611) Resp:  [16-18] 16 (07/02 0611) BP: (108-134)/(74-79) 108/74 mmHg (07/02 0611) SpO2:  [95 %-99 %] 96 % (07/02 0727) Last BM Date: 03/13/14  Intake/Output from previous day: 07/01 0701 - 07/02 0700 In: 1286.7 [P.O.:60; I.V.:1076.7; IV Piggyback:150] Out: 9295 [Urine:3325] Intake/Output this shift:    PE: General- In NAD Abdomen-soft, wounds clean, non tender.    Lab Results:   Recent Labs  03/15/14 0550  WBC 8.3  HGB 8.3*  HCT 24.6*  PLT 282   BMET  Recent Labs  03/15/14 0550  NA 142  K 3.2*  CL 104  CO2 25  GLUCOSE 97  BUN 3*  CREATININE 1.21*  CALCIUM 9.3   PT/INR No results found for this basename: LABPROT, INR,  in the last 72 hours Comprehensive Metabolic Panel:    Component Value Date/Time   NA 142 03/15/2014 0550   NA 140 03/11/2014 0529   K 3.2* 03/15/2014 0550   K 3.4* 03/11/2014 0529   CL 104 03/15/2014 0550   CL 103 03/11/2014 0529   CO2 25 03/15/2014 0550   CO2 23 03/11/2014 0529   BUN 3* 03/15/2014 0550   BUN 5* 03/11/2014 0529   CREATININE 1.21* 03/15/2014 0550   CREATININE 1.22* 03/11/2014 0529   GLUCOSE 97 03/15/2014 0550   GLUCOSE 126* 03/11/2014 0529   CALCIUM 9.3 03/15/2014 0550   CALCIUM 9.0 03/11/2014 0529   AST 36 03/10/2014 1031   AST 20 02/21/2014 1110   ALT 39* 03/10/2014 1031   ALT 29 02/21/2014 1110   ALKPHOS 125* 03/10/2014 1031   ALKPHOS 92 02/21/2014 1110   BILITOT 0.7 03/10/2014 1031   BILITOT 0.5 02/21/2014 1110   PROT 7.6 03/10/2014 1031   PROT 7.9 02/21/2014 1110   ALBUMIN 3.3* 03/10/2014 1031   ALBUMIN 3.9 02/21/2014 1110      Studies/Results: No results found.  Anti-infectives: Anti-infectives   Start     Dose/Rate Route Frequency Ordered Stop   03/10/14 2000  piperacillin-tazobactam (ZOSYN) IVPB 3.375 g     3.375 g 12.5 mL/hr over 240 Minutes Intravenous 3 times per day 03/10/14 1802        Assessment Principal Problem:   Pelvic abscess- Depending on symptoms, labs, vitals, may need repeat scan tomorrow and +/- repeat drain in different position.  Night sweats are concerning.  Will try low residue diet today.  Will tentatively plan to repeat CT tomorrow.   Active Problems:   COPD (chronic obstructive pulmonary disease) with emphysema   Parastomal hernia s/p colostomy takedown & repair 02/27/2014   CKD (chronic kidney disease) stage 2, GFR 60-89 ml/min    LOS: 5 days      [facility] 03/15/2014

## 2014-03-16 ENCOUNTER — Inpatient Hospital Stay (HOSPITAL_COMMUNITY): Payer: Medicare Other

## 2014-03-16 ENCOUNTER — Encounter (HOSPITAL_COMMUNITY): Payer: Self-pay | Admitting: Radiology

## 2014-03-16 MED ORDER — IOHEXOL 300 MG/ML  SOLN: 25.0000 mL | INTRAMUSCULAR | Status: DC

## 2014-03-16 MED ORDER — IOHEXOL 300 MG/ML  SOLN
100.0000 mL | Freq: Once | INTRAMUSCULAR | Status: AC | PRN
  Administered 2014-03-16: 100 mL via INTRAVENOUS

## 2014-03-16 NOTE — Progress Notes (Signed)
UR completed 

## 2014-03-16 NOTE — Progress Notes (Signed)
CSW assisting with d/c planning. Pt hopes to return home at d/c. If increased pain with ambulation returns pt feels ST Rehab will be needed. Pt has bed offers and daughter planned to tour SNF's. CT results are pending. CSW will continue to follow to assist with d/c planning.  Werner Lean LCSW [phone]

## 2014-03-16 NOTE — Progress Notes (Signed)
IM ( Important Message from [facility]) given to patient about their rights as a patientAneta Jackson [phone]

## 2014-03-16 NOTE — Progress Notes (Addendum)
  Subjective: Feels well today, having bms  Objective: Vital signs in last 24 hours: Temp:  [98 F (36.7 C)-98.4 F (36.9 C)] 98.4 F (36.9 C) (07/03 0600) Pulse Rate:  [74-85] 74 (07/03 0600) Resp:  [16-17] 16 (07/03 0600) BP: (111-138)/(73-86) 111/73 mmHg (07/03 0600) SpO2:  [96 %-98 %] 96 % (07/03 0600) Last BM Date: 03/15/14  Intake/Output from previous day: 07/02 0701 - 07/03 0700 In: 2530 [P.O.:1080; I.V.:1400; IV Piggyback:50] Out: 2800 [Urine:2800] Intake/Output this shift:    General appearance: no distress Resp: clear to auscultation bilaterally Cardio: regular rate and rhythm GI: wound clean without infection, bs present, nontender  Lab Results:   Recent Labs  03/15/14 0550  WBC 8.3  HGB 8.3*  HCT 24.6*  PLT 282   BMET  Recent Labs  03/15/14 0550  NA 142  K 3.2*  CL 104  CO2 25  GLUCOSE 97  BUN 3*  CREATININE 1.21*  CALCIUM 9.3    Anti-infectives: Anti-infectives   Start     Dose/Rate Route Frequency Ordered Stop   03/10/14 2000  piperacillin-tazobactam (ZOSYN) IVPB 3.375 g     3.375 g 12.5 mL/hr over 240 Minutes Intravenous 3 times per day 03/10/14 1802        Assessment/Plan: Pelvic abscess   Will repeat ct today, if getting better wbc normal tomorrow may transition to oral abx   Addendum: ct with residual air that goes to anastomosis, cannot rule out leak, did not get contrast although wanted it with oral contrast, discussed with radiology, will plan gg enema in am tomorrow. If she is actively leaking then would manage differently.   [facility] 03/16/2014

## 2014-03-17 ENCOUNTER — Inpatient Hospital Stay (HOSPITAL_COMMUNITY): Payer: Medicare Other

## 2014-03-17 LAB — CBC
HCT: 25.2 % — ABNORMAL LOW (ref 36.0–46.0)
Hemoglobin: 8.3 g/dL — ABNORMAL LOW (ref 12.0–15.0)
MCH: 28.8 pg (ref 26.0–34.0)
MCHC: 32.9 g/dL (ref 30.0–36.0)
MCV: 87.5 fL (ref 78.0–100.0)
Platelets: 305 10*3/uL (ref 150–400)
RBC: 2.88 MIL/uL — ABNORMAL LOW (ref 3.87–5.11)
RDW: 13.5 % (ref 11.5–15.5)
WBC: 7.2 10*3/uL (ref 4.0–10.5)

## 2014-03-17 LAB — BASIC METABOLIC PANEL
Anion gap: 13 (ref 5–15)
BUN: 7 mg/dL (ref 6–23)
CO2: 25 mEq/L (ref 19–32)
Calcium: 9.6 mg/dL (ref 8.4–10.5)
Chloride: 104 mEq/L (ref 96–112)
Creatinine, Ser: 1.3 mg/dL — ABNORMAL HIGH (ref 0.50–1.10)
GFR calc Af Amer: 47 mL/min — ABNORMAL LOW (ref >90)
GFR calc non Af Amer: 41 mL/min — ABNORMAL LOW (ref >90)
Glucose, Bld: 94 mg/dL (ref 70–99)
Potassium: 3.1 mEq/L — ABNORMAL LOW (ref 3.7–5.3)
Sodium: 142 mEq/L (ref 137–147)

## 2014-03-17 MED ORDER — POTASSIUM CHLORIDE CRYS ER 20 MEQ PO TBCR
40.0000 meq | EXTENDED_RELEASE_TABLET | Freq: Once | ORAL | Status: AC
  Administered 2014-03-17: 40 meq via ORAL
  Filled 2014-03-17: qty 2

## 2014-03-17 MED ORDER — ENOXAPARIN SODIUM 40 MG/0.4ML ~~LOC~~ SOLN
40.0000 mg | SUBCUTANEOUS | Status: DC
  Filled 2014-03-17 (×6): qty 0.4

## 2014-03-17 NOTE — Progress Notes (Signed)
Small leak on gastrograffin enema but clinically doing well having BM. Will place on ice chips for today and follow.  Area too small to drain.

## 2014-03-17 NOTE — Progress Notes (Addendum)
Subjective: Passing flatus and stool, no complaints this am, tol clears  Objective: Vital signs in last 24 hours: Temp:  [97.9 F (36.6 C)-99.2 F (37.3 C)] 98.3 F (36.8 C) (07/04 0536) Pulse Rate:  [76-87] 76 (07/04 0536) Resp:  [16-18] 16 (07/04 0536) BP: (118-128)/(74-81) 128/81 mmHg (07/04 0536) SpO2:  [97 %-98 %] 97 % (07/04 0536) Last BM Date: 03/15/14  Intake/Output from previous day: 07/03 0701 - 07/04 0700 In: 1951.7 [P.O.:600; I.V.:1201.7; IV Piggyback:150] Out: 3100 [Urine:3100] Intake/Output this shift:    General appearance: no distress Resp: clear to auscultation bilaterally Cardio: regular rate and rhythm GI: wound without infection, soft nt bs present  Lab Results:   Recent Labs  03/15/14 0550 03/17/14 0450  WBC 8.3 7.2  HGB 8.3* 8.3*  HCT 24.6* 25.2*  PLT 282 305   BMET  Recent Labs  03/15/14 0550 03/17/14 0450  NA 142 142  K 3.2* 3.1*  CL 104 104  CO2 25 25  GLUCOSE 97 94  BUN 3* 7  CREATININE 1.21* 1.30*  CALCIUM 9.3 9.6   PT/INR No results found for this basename: LABPROT, INR,  in the last 72 hours ABG No results found for this basename: PHART, PCO2, PO2, HCO3,  in the last 72 hours  Studies/Results: Ct Abdomen Pelvis W Contrast  03/16/2014   CLINICAL DATA:  f/u pelvic abscess  New  EXAM: CT ABDOMEN AND PELVIS WITH CONTRAST  TECHNIQUE: Multidetector CT imaging of the abdomen and pelvis was performed using the standard protocol following bolus administration of intravenous contrast.  CONTRAST:  175m OMNIPAQUE IOHEXOL 300 MG/ML  SOLN  COMPARISON:  CT abdomen pelvis 03/10/2014, pelvic CT 03/11/2014  FINDINGS: Lung bases are negative.  Diffuse low attenuation throughout the liver parenchyma. Liver otherwise negative, status post cholecystectomy.  The adrenals and pancreas unremarkable. Stable small 8 mm hypodensity in the spleen which is otherwise unremarkable. Stable small low attenuating foci within the kidneys likely cyst.  A focal  loculated collection of air is appreciated in the pelvis in the region of prior abscess bed, measuring 2.6 x 1.6 cm. This collection extends to the anastomotic site in rectosigmoid colon region. There has otherwise been drainage of the fluid in this area. There are mild inflammatory changes in the presacral region of the pelvis. No bowel obstruction is appreciated. Postsurgical changes are appreciated within the rectosigmoid colon.  Otherwise no drainable loculated fluid collections within the abdomen or pelvis no further regions of free fluid.  There is no abdominal aortic aneurysm. The celiac, SMA, IMA, portal vein, SMV are opacified.  There are no aggressive appearing osseous lesions. Multilevel spondylosis within the spine. No abdominal wall or inguinal hernia.  IMPRESSION: Residual small loculated air in the previous abscess site in the pelvis. This finding extends to the anastomotic site of the rectosigmoid colon. Communication cannot be excluded. Surveillance evaluation is recommended. If clinically warranted further evaluation with single-contrast water-soluble barium enema may help to further characterize these findings. These results were called by telephone at the time of interpretation on 03/16/2014 at 1:17 PM to Dr. MRolm Bookbinder, who verbally acknowledged these results.  Mild residual inflammatory changes in the presacral region of pelvis.  Hepatic steatosis   Electronically Signed   By: HMargaree MackintoshM.D.   On: 03/16/2014 13:18    Anti-infectives: Anti-infectives   Start     Dose/Rate Route Frequency Ordered Stop   03/10/14 2000  piperacillin-tazobactam (ZOSYN) IVPB 3.375 g     3.375 g 12.5  mL/hr over 240 Minutes Intravenous 3 times per day 03/10/14 1802        Assessment/Plan: 1.Pelvic abscess, ? Leak s/p colostomy takedown Did well overnight, will get gg enema this am to see if active leak. If no active leak will begin advancing diet. If leaking will make npo consider tna  2.  Hypokalemia- replace today with oral potassium recheck in am 3. Lovenox, scds [facility] 03/17/2014

## 2014-03-18 LAB — BASIC METABOLIC PANEL
Anion gap: 12 (ref 5–15)
BUN: 6 mg/dL (ref 6–23)
CO2: 26 mEq/L (ref 19–32)
Calcium: 9.6 mg/dL (ref 8.4–10.5)
Chloride: 106 mEq/L (ref 96–112)
Creatinine, Ser: 1.23 mg/dL — ABNORMAL HIGH (ref 0.50–1.10)
GFR calc Af Amer: 51 mL/min — ABNORMAL LOW (ref >90)
GFR calc non Af Amer: 44 mL/min — ABNORMAL LOW (ref >90)
Glucose, Bld: 95 mg/dL (ref 70–99)
Potassium: 3.5 mEq/L — ABNORMAL LOW (ref 3.7–5.3)
Sodium: 144 mEq/L (ref 137–147)

## 2014-03-18 LAB — MAGNESIUM: Magnesium: 1.9 mg/dL (ref 1.5–2.5)

## 2014-03-18 LAB — PHOSPHORUS: Phosphorus: 3.4 mg/dL (ref 2.3–4.6)

## 2014-03-18 MED ORDER — TRACE MINERALS CR-CU-F-FE-I-MN-MO-SE-ZN IV SOLN
INTRAVENOUS | Status: AC
  Administered 2014-03-18: 18:00:00 via INTRAVENOUS
  Filled 2014-03-18: qty 1000

## 2014-03-18 MED ORDER — KCL IN DEXTROSE-NACL 20-5-0.9 MEQ/L-%-% IV SOLN
INTRAVENOUS | Status: AC
  Administered 2014-03-18: 1000 mL via INTRAVENOUS
  Filled 2014-03-18: qty 1000

## 2014-03-18 MED ORDER — INSULIN ASPART 100 UNIT/ML ~~LOC~~ SOLN
0.0000 [IU] | Freq: Four times a day (QID) | SUBCUTANEOUS | Status: DC
  Administered 2014-03-20 – 2014-03-22 (×8): 1 [IU] via SUBCUTANEOUS

## 2014-03-18 MED ORDER — POTASSIUM CHLORIDE 10 MEQ/100ML IV SOLN
10.0000 meq | INTRAVENOUS | Status: AC
  Administered 2014-03-18 (×4): 10 meq via INTRAVENOUS
  Filled 2014-03-18 (×4): qty 100

## 2014-03-18 MED ORDER — ZOLPIDEM TARTRATE 5 MG PO TABS
5.0000 mg | ORAL_TABLET | Freq: Every evening | ORAL | Status: DC | PRN
  Administered 2014-03-19 – 2014-03-21 (×4): 5 mg via ORAL
  Filled 2014-03-18 (×4): qty 1

## 2014-03-18 MED ORDER — ZOLPIDEM TARTRATE 10 MG PO TABS: 10.0000 mg | ORAL_TABLET | Freq: Every evening | ORAL | Status: DC | PRN

## 2014-03-18 MED ORDER — FAT EMULSION 20 % IV EMUL
240.0000 mL | INTRAVENOUS | Status: AC
  Administered 2014-03-18: 240 mL via INTRAVENOUS
  Filled 2014-03-18: qty 250

## 2014-03-18 NOTE — Progress Notes (Signed)
INITIAL NUTRITION ASSESSMENT  DOCUMENTATION CODES Per approved criteria  -Obesity Unspecified   INTERVENTION: -TPN per pharmacy RD to follow  NUTRITION DIAGNOSIS: Inadequate oral intake related to altered GI function as evidenced by npo status.   Goal: Patient to meet >90% estimated needs with TPN.  Monitor:  TPN, lbs, weight trend  Reason for Assessment: new TPN  68 y.o. female  Admitting Dx: Pelvic abscess in female  ASSESSMENT: Patient with diverticulitis with prior partial colectomy and colostomy with recent colostomy takedown and now presents with anastomotic leak requiring bowel rest.    Patients reports being hungry and wishes she could eat.  Good intake prior to admit.  Weight trend overall stable.  Height: Ht Readings from Last 1 Encounters:  03/10/14 5' 5"  (1.651 m)    Weight: Wt Readings from Last 1 Encounters:  03/10/14 182 lb (82.555 kg)    Ideal Body Weight: 125 lbs  % Ideal Body Weight: 146  Wt Readings from Last 10 Encounters:  03/10/14 182 lb (82.555 kg)  02/27/14 192 lb 7.4 oz (87.3 kg)  02/27/14 192 lb 7.4 oz (87.3 kg)  02/21/14 187 lb 6.4 oz (85.004 kg)  01/31/14 185 lb (83.915 kg)  01/30/14 182 lb (82.555 kg)  01/26/14 185 lb (83.915 kg)  01/10/14 185 lb (83.915 kg)  01/04/14 183 lb (83.008 kg)  12/07/13 182 lb (82.555 kg)    Usual Body Weight: 182 lbs  % Usual Body Weight: 100  BMI:  Body mass index is 30.29 kg/(m^2).  Estimated Nutritional Needs: Kcal: 1800-1900 Protein: 80-90 gm Fluid: 1.8L  Skin: intact  Diet Order: NPO  EDUCATION NEEDS: -Education needs addressed   Intake/Output Summary (Last 24 hours) at 03/18/14 1433 Last data filed at 03/18/14 1007  Gross per 24 hour  Intake 1246.25 ml  Output   1850 ml  Net -603.75 ml    Labs:   Recent Labs Lab 03/15/14 0550 03/17/14 0450 03/18/14 0544 03/18/14 0545  NA 142 142 144  --   K 3.2* 3.1* 3.5*  --   CL 104 104 106  --   CO2 25 25 26   --   BUN 3* 7  6  --   CREATININE 1.21* 1.30* 1.23*  --   CALCIUM 9.3 9.6 9.6  --   MG  --   --   --  1.9  PHOS  --   --   --  3.4  GLUCOSE 97 94 95  --     CBG (last 3)  No results found for this basename: GLUCAP,  in the last 72 hours  Scheduled Meds: . enoxaparin (LOVENOX) injection  40 mg Subcutaneous Q24H  . gabapentin  600 mg Oral QHS  . hydrochlorothiazide  12.5 mg Oral q morning - 10a  . insulin aspart  0-9 Units Subcutaneous 4 times per day  . levothyroxine  125 mcg Oral QAC breakfast  . nystatin  5 mL Oral QID  . pantoprazole (PROTONIX) IV  40 mg Intravenous QHS  . piperacillin-tazobactam (ZOSYN)  IV  3.375 g Intravenous 3 times per day  . tiotropium  18 mcg Inhalation Daily    Continuous Infusions: . dextrose 5 % and 0.9 % NaCl with KCl 20 mEq/L 75 mL/hr at 03/18/14 0417  . dextrose 5 % and 0.9 % NaCl with KCl 20 mEq/L    . Marland KitchenTPN (CLINIMIX-E) Adult     And  . fat emulsion      Past Medical History  Diagnosis Date  .  Hypertension   . Hypothyroid   . Depression   . Hypercholesteremia   . Sickle cell trait   . GERD (gastroesophageal reflux disease)   . History of blood transfusion 1965  . Diverticulitis 09/2013    with perforation  . Pericolonic abscess 2015  . Obesity   . Hypokalemia   . H/O hiatal hernia   . Arthritis     fingers  . Headache(784.0)     migraine every 5- to 6 years  . Blood clot in vein 1968  . Hepatitis age 63 or 64    had heapatitis, not sure which kind  . Heart murmur since age 21    mild  . Diverticulitis of colon with perforation 09/28/2013    Past Surgical History  Procedure Laterality Date  . Cholecystectomy  2002  . Tonsillectomy  1952  . Colon resection N/A 10/11/2013    Procedure: EXPLORATORY LAPAROTOMY WITH RECTOSIGMOID COLECTOMY AND END COLOSTOMY;  Surgeon: Stark Klein, MD;  Location: [facility];  Service: General;  Laterality: N/A;  . Colostomy takedown N/A 02/27/2014    Procedure: COLOSTOMY TAKEDOWN [name] HERNIA REPAIR;  Surgeon:  Stark Klein, MD;  Location: [facility];  Service: General;  Laterality: N/A;    Antonieta Iba, RD, LDN Clinical Inpatient Dietitian Pager:  [phone] Weekend and after hours pager:  [phone]

## 2014-03-18 NOTE — Progress Notes (Signed)
Pt states she will not take Neurontin as she does not have Neuropathy.

## 2014-03-18 NOTE — Progress Notes (Signed)
Subjective: No complaints, concerned about findings, having flatus and stool, no ab pain, ambulating, cannot sleep well  Objective: Vital signs in last 24 hours: Temp:  [97.9 F (36.6 C)-98.7 F (37.1 C)] 97.9 F (36.6 C) (07/05 0607) Pulse Rate:  [69-80] 80 (07/05 0607) Resp:  [16-20] 20 (07/05 0607) BP: (106-148)/(69-83) 106/69 mmHg (07/05 0607) SpO2:  [96 %-100 %] 97 % (07/05 0848) Last BM Date: 03/17/14  Intake/Output from previous day: 07/04 0701 - 07/05 0700 In: 1785.4 [I.V.:1685.4; IV Piggyback:100] Out: 2200 [Urine:2200] Intake/Output this shift:    General appearance: no distress GI: soft nontender nondistended wounds clean  Lab Results:   Recent Labs  03/17/14 0450  WBC 7.2  HGB 8.3*  HCT 25.2*  PLT 305   BMET  Recent Labs  03/17/14 0450 03/18/14 0544  NA 142 144  K 3.1* 3.5*  CL 104 106  CO2 25 26  GLUCOSE 94 95  BUN 7 6  CREATININE 1.30* 1.23*  CALCIUM 9.6 9.6    Studies/Results: Ct Abdomen Pelvis W Contrast  03/16/2014   CLINICAL DATA:  f/u pelvic abscess  New  EXAM: CT ABDOMEN AND PELVIS WITH CONTRAST  TECHNIQUE: Multidetector CT imaging of the abdomen and pelvis was performed using the standard protocol following bolus administration of intravenous contrast.  CONTRAST:  173m OMNIPAQUE IOHEXOL 300 MG/ML  SOLN  COMPARISON:  CT abdomen pelvis 03/10/2014, pelvic CT 03/11/2014  FINDINGS: Lung bases are negative.  Diffuse low attenuation throughout the liver parenchyma. Liver otherwise negative, status post cholecystectomy.  The adrenals and pancreas unremarkable. Stable small 8 mm hypodensity in the spleen which is otherwise unremarkable. Stable small low attenuating foci within the kidneys likely cyst.  A focal loculated collection of air is appreciated in the pelvis in the region of prior abscess bed, measuring 2.6 x 1.6 cm. This collection extends to the anastomotic site in rectosigmoid colon region. There has otherwise been drainage of the fluid  in this area. There are mild inflammatory changes in the presacral region of the pelvis. No bowel obstruction is appreciated. Postsurgical changes are appreciated within the rectosigmoid colon.  Otherwise no drainable loculated fluid collections within the abdomen or pelvis no further regions of free fluid.  There is no abdominal aortic aneurysm. The celiac, SMA, IMA, portal vein, SMV are opacified.  There are no aggressive appearing osseous lesions. Multilevel spondylosis within the spine. No abdominal wall or inguinal hernia.  IMPRESSION: Residual small loculated air in the previous abscess site in the pelvis. This finding extends to the anastomotic site of the rectosigmoid colon. Communication cannot be excluded. Surveillance evaluation is recommended. If clinically warranted further evaluation with single-contrast water-soluble barium enema may help to further characterize these findings. These results were called by telephone at the time of interpretation on 03/16/2014 at 1:17 PM to Dr. MRolm Bookbinder, who verbally acknowledged these results.  Mild residual inflammatory changes in the presacral region of pelvis.  Hepatic steatosis   Electronically Signed   By: HMargaree MackintoshM.D.   On: 03/16/2014 13:18    Anti-infectives: Anti-infectives   Start     Dose/Rate Route Frequency Ordered Stop   03/10/14 2000  piperacillin-tazobactam (ZOSYN) IVPB 3.375 g     3.375 g 12.5 mL/hr over 240 Minutes Intravenous 3 times per day 03/10/14 1802        Assessment/Plan: Anastomotic leak s/p colostomy takedown  1. Iv pain meds as needed 2. pulm toilet, ambulate 3. Hypokalemia- replace today and recheck in am tomorrow 4.  GI- gg enema with small anastomotic leak, there is not large enough collection to put another drain in right now, she does not require surgery or diversion right now. Will plan on bowel rest, initiate tpn today, discussed follow up ct to reevaluate collection to possibly drain.  Discussed  conservative mgt and hopefully will heal and may be able to go home or to snf while on tpn.  If worsens or not healing discussed options including ileostomy.  She understands plan. 5. Lovenox, scds  Sheronda Parran 03/18/2014

## 2014-03-18 NOTE — Progress Notes (Signed)
PARENTERAL NUTRITION CONSULT NOTE - INITIAL  Pharmacy Consult for TNA Indication: anastomotic leak requiring bowel rest  Allergies  Allergen Reactions  . Estrogens     Blood clot in leg    Patient Measurements: Height: 5' 5"  (165.1 cm) Weight: 182 lb (82.555 kg) IBW/kg (Calculated) : 57 Adjusted Body Weight: 65 kg  Vital Signs: Temp: 97.9 F (36.6 C) (07/05 0607) Temp src: Oral (07/05 0607) BP: 106/69 mmHg (07/05 0607) Pulse Rate: 80 (07/05 0607) Intake/Output from previous day: 07/04 0701 - 07/05 0700 In: 1785.4 [I.V.:1685.4; IV Piggyback:100] Out: 2200 [Urine:2200] Intake/Output from this shift:    Labs:  Recent Labs  03/17/14 0450  WBC 7.2  HGB 8.3*  HCT 25.2*  PLT 305     Recent Labs  03/17/14 0450 03/18/14 0544  NA 142 144  K 3.1* 3.5*  CL 104 106  CO2 25 26  GLUCOSE 94 95  BUN 7 6  CREATININE 1.30* 1.23*  CALCIUM 9.6 9.6   Estimated Creatinine Clearance: 45.8 ml/min (by C-G formula based on Cr of 1.23).   No results found for this basename: GLUCAP,  in the last 72 hours  Insulin Requirements in the past 24 hours:  No insulin orders  Current Nutrition: NPO  IVF: D5-NS with KCl 20 mEq/L at 75 ml/hr  Assessment:  54 yoF with anastomotic leak s/p colostomy [place].  Per surgery, plan is conservative management with bowel rest and TNA.   Glucose: no history of DM, starting CBG checks with TNA  Electrolytes: K 3.5 - replacement ordered by surgery.  Mag/Phos and other lytes WNL.  Renal: CKD stage 2, SCr 1.23, has been stable  LFTs: ordered for AM, mildly elevated on admission  TGs: ordered for AM  Prealbumin: ordered for AM  Nutritional Goals:  RD recs: pending Estimated daily goals: 86g protein and 1750 kcal  TPN Access: PICC, single lumen TPN day#: 1  Plan: At 1800 today:  Start Clinimix E 5/15 at 40 ml/hr.  20% fat emulsion at 10 ml/hr.  Plan to advance as tolerated to the goal rate.  TNA to contain standard  multivitamins and trace elements.  Reduce IVF to 35 ml/hr.  Add CBG checks q6h with sensitive scale SSI.   TNA lab panels on Mondays & Thursdays.  F/u RD recommendations for nutrition goals.  Hershal Coria, PharmD, BCPS Pager: [phone] 03/18/2014 8:53 AM

## 2014-03-18 NOTE — Progress Notes (Signed)
Peripherally Inserted Central Catheter/Midline Placement  The IV Nurse has discussed with the patient and/or persons authorized to consent for the patient, the purpose of this procedure and the potential benefits and risks involved with this procedure.  The benefits include less needle sticks, lab draws from the catheter and patient may be discharged home with the catheter.  Risks include, but not limited to, infection, bleeding, blood clot (thrombus formation), and puncture of an artery; nerve damage and irregular heat beat.  Alternatives to this procedure were also discussed.  PICC/Midline Placement Documentation  PICC / Midline Double Lumen 24/46/28 PICC Right Basilic 38 cm 0 cm (Active)  Indication for Insertion or Continuance of Line Administration of hyperosmolar/irritating solutions (i.e. TPN, Vancomycin, etc.);Poor Vasculature-patient has had multiple peripheral attempts or PIVs lasting less than 24 hours 03/18/2014  4:57 PM  Exposed Catheter (cm) 0 cm 03/18/2014  4:57 PM  Site Assessment Clean;Dry;Intact 03/18/2014  4:57 PM  Lumen #1 Status Flushed;Saline locked;Blood return noted 03/18/2014  4:57 PM  Lumen #2 Status Flushed;Saline locked;Blood return noted 03/18/2014  4:57 PM  Dressing Type Transparent 03/18/2014  4:57 PM  Dressing Status Clean;Dry;Intact;Antimicrobial disc in place 03/18/2014  4:57 PM  Line Care Connections checked and tightened 03/18/2014  4:57 PM  Line Adjustment (NICU/IV Team Only) No 03/18/2014  4:57 PM  Dressing Intervention New dressing 03/18/2014  4:57 PM  Dressing Change Due 03/25/14 03/18/2014  4:57 PM       Rolena Infante 03/18/2014, 4:59 PM

## 2014-03-19 LAB — GLUCOSE, CAPILLARY
Glucose-Capillary: 101 mg/dL — ABNORMAL HIGH (ref 70–99)
Glucose-Capillary: 120 mg/dL — ABNORMAL HIGH (ref 70–99)
Glucose-Capillary: 121 mg/dL — ABNORMAL HIGH (ref 70–99)
Glucose-Capillary: 124 mg/dL — ABNORMAL HIGH (ref 70–99)

## 2014-03-19 LAB — MAGNESIUM: Magnesium: 2.1 mg/dL (ref 1.5–2.5)

## 2014-03-19 LAB — DIFFERENTIAL
Basophils Absolute: 0 10*3/uL (ref 0.0–0.1)
Basophils Relative: 1 % (ref 0–1)
Eosinophils Absolute: 0.7 10*3/uL (ref 0.0–0.7)
Eosinophils Relative: 10 % — ABNORMAL HIGH (ref 0–5)
Lymphocytes Relative: 30 % (ref 12–46)
Lymphs Abs: 2.2 10*3/uL (ref 0.7–4.0)
Monocytes Absolute: 0.4 10*3/uL (ref 0.1–1.0)
Monocytes Relative: 5 % (ref 3–12)
Neutro Abs: 3.9 10*3/uL (ref 1.7–7.7)
Neutrophils Relative %: 54 % (ref 43–77)

## 2014-03-19 LAB — COMPREHENSIVE METABOLIC PANEL
ALT: 39 U/L — ABNORMAL HIGH (ref 0–35)
AST: 35 U/L (ref 0–37)
Albumin: 3.1 g/dL — ABNORMAL LOW (ref 3.5–5.2)
Alkaline Phosphatase: 79 U/L (ref 39–117)
Anion gap: 15 (ref 5–15)
BUN: 8 mg/dL (ref 6–23)
CO2: 25 mEq/L (ref 19–32)
Calcium: 9.7 mg/dL (ref 8.4–10.5)
Chloride: 100 mEq/L (ref 96–112)
Creatinine, Ser: 1.28 mg/dL — ABNORMAL HIGH (ref 0.50–1.10)
GFR calc Af Amer: 48 mL/min — ABNORMAL LOW (ref >90)
GFR calc non Af Amer: 42 mL/min — ABNORMAL LOW (ref >90)
Glucose, Bld: 108 mg/dL — ABNORMAL HIGH (ref 70–99)
Potassium: 3.5 mEq/L — ABNORMAL LOW (ref 3.7–5.3)
Sodium: 140 mEq/L (ref 137–147)
Total Bilirubin: 0.6 mg/dL (ref 0.3–1.2)
Total Protein: 7.1 g/dL (ref 6.0–8.3)

## 2014-03-19 LAB — CBC
HCT: 23 % — ABNORMAL LOW (ref 36.0–46.0)
Hemoglobin: 7.7 g/dL — ABNORMAL LOW (ref 12.0–15.0)
MCH: 29.1 pg (ref 26.0–34.0)
MCHC: 33.5 g/dL (ref 30.0–36.0)
MCV: 86.8 fL (ref 78.0–100.0)
Platelets: 320 10*3/uL (ref 150–400)
RBC: 2.65 MIL/uL — ABNORMAL LOW (ref 3.87–5.11)
RDW: 13.5 % (ref 11.5–15.5)
WBC: 7.2 10*3/uL (ref 4.0–10.5)

## 2014-03-19 LAB — PHOSPHORUS: Phosphorus: 3.7 mg/dL (ref 2.3–4.6)

## 2014-03-19 LAB — TRIGLYCERIDES: Triglycerides: 249 mg/dL — ABNORMAL HIGH (ref <150)

## 2014-03-19 LAB — PREALBUMIN: Prealbumin: 28.6 mg/dL (ref 17.0–34.0)

## 2014-03-19 MED ORDER — KCL IN DEXTROSE-NACL 20-5-0.9 MEQ/L-%-% IV SOLN
INTRAVENOUS | Status: AC
  Filled 2014-03-19: qty 1000

## 2014-03-19 MED ORDER — FAT EMULSION 20 % IV EMUL
250.0000 mL | INTRAVENOUS | Status: AC
  Administered 2014-03-19: 250 mL via INTRAVENOUS
  Filled 2014-03-19: qty 250

## 2014-03-19 MED ORDER — POTASSIUM CHLORIDE 10 MEQ/100ML IV SOLN
10.0000 meq | INTRAVENOUS | Status: AC
  Administered 2014-03-19 (×3): 10 meq via INTRAVENOUS
  Filled 2014-03-19 (×3): qty 100

## 2014-03-19 MED ORDER — M.V.I. ADULT IV INJ
INJECTION | INTRAVENOUS | Status: AC
  Administered 2014-03-19: 18:00:00 via INTRAVENOUS
  Filled 2014-03-19: qty 2000

## 2014-03-19 NOTE — Progress Notes (Addendum)
PARENTERAL NUTRITION CONSULT NOTE  Pharmacy Consult for TNA Indication: anastomotic leak requiring bowel rest  Allergies  Allergen Reactions  . Estrogens     Blood clot in leg    Patient Measurements: Height: 5' 5"  (165.1 cm) Weight: 182 lb (82.555 kg) IBW/kg (Calculated) : 57 Adjusted Body Weight: 65 kg  Vital Signs: Temp: 97.9 F (36.6 C) (07/06 0630) Temp src: Oral (07/06 0630) BP: 110/64 mmHg (07/06 0630) Pulse Rate: 47 (07/06 0630) Intake/Output from previous day: 07/05 0701 - 07/06 0700 In: 1866.7 [P.O.:30; I.V.:673.3; IV Piggyback:550; TPN:613.3] Out: 2500 [Urine:2500] Intake/Output from this shift: Total I/O In: 1151.7 [P.O.:30; I.V.:408.3; IV Piggyback:100; TPN:613.3] Out: 1350 [Urine:1350]  Labs:  Recent Labs  03/17/14 0450 03/19/14 0346  WBC 7.2 7.2  HGB 8.3* 7.7*  HCT 25.2* 23.0*  PLT 305 320     Recent Labs  03/17/14 0450 03/18/14 0544 03/18/14 0545 03/19/14 0346  NA 142 144  --  140  K 3.1* 3.5*  --  3.5*  CL 104 106  --  100  CO2 25 26  --  25  GLUCOSE 94 95  --  108*  BUN 7 6  --  8  CREATININE 1.30* 1.23*  --  1.28*  CALCIUM 9.6 9.6  --  9.7  MG  --   --  1.9 2.1  PHOS  --   --  3.4 3.7  PROT  --   --   --  7.1  ALBUMIN  --   --   --  3.1*  AST  --   --   --  35  ALT  --   --   --  39*  ALKPHOS  --   --   --  79  BILITOT  --   --   --  0.6  corrected Ca = 10.4 Estimated Creatinine Clearance: 44 ml/min (by C-G formula based on Cr of 1.28).    Recent Labs  03/19/14 0005  GLUCAP 101*    Insulin Requirements in the past 24 hours:  none  Current Nutrition:  NPO Clinimix-E 5/15 at 40 mL/hr  IVF: D5-NS with KCl 20 mEq/L at 35 ml/hr  TPN Access: PICC, single lumen  Nutritional Goals:  From RD assessment 7/5 PM: 1800 - 1900 KCal/day 80 - 90 grams protein/day  Goal Rate and Formula: Clinimix-E 5/20 at 70 mL/hr + Fat Emulsion 20% at 10 mL/hr to deliver 1958 KCal/day, 84 grams protein/day   Assessment:  69 yoF with  anastomotic leak s/p colostomy [place].  Per surgery, plan is conservative management with bowel rest and TNA.  7/6   Beginning D#2 TPN  Glucose: no history of DM.  CBGs < 150, not requiring SSI coverage so far.  Electrolytes: K 3.5 - unchanged after KCl 42mq IV x 4 runs yesterday.  Other lytes WNL  Renal: CKD stage 2, SCr 1.28, has been stable  LFTs: minimal elevation of ALT on admission, stable.  TGs: pending  Prealbumin: pending  Plan:  1. KCl 131m IV q1h x 3 2. At 1800 hr today:  Change Clinimix to E 5/20 at 50 mL/hr  Continue Fat Emulsion 20% at 10 mL/hr  Continue standard multivitamins and trace elements in TNA.  Reduce IVF to 25 ml/hr.  Continue CBG checks q6h with sensitive scale SSI.  3. Follow-up on TG and prealbumin result from today. 4. BMet, Mg, Phos tomorrow. 5. Over next few days as tolerated, increase Clinimix-E 5/20 to goal rate of 70 mL/hr.  Clayburn Pert, PharmD, BCPS Pager: [phone] 03/19/2014  7:11 AM  Addendum: TG level 249 - elevated, but not high enough to alter fat emulsion orders yet.  Will follow TG level - rechecking in AM.

## 2014-03-19 NOTE — Progress Notes (Signed)
Patient ID: Valerie Jackson, female   DOB: Jul 02, 1946, 68 y.o.   MRN: [id number]    Subjective: Feeling OK, no abd pain.    Objective: Vital signs in last 24 hours: Temp:  [97.9 F (36.6 C)-98.3 F (36.8 C)] 97.9 F (36.6 C) (07/06 0630) Pulse Rate:  [47-76] 47 (07/06 0630) Resp:  [16-20] 20 (07/06 0630) BP: (99-135)/(64-80) 110/64 mmHg (07/06 0630) SpO2:  [94 %-98 %] 98 % (07/06 0630) Last BM Date: 03/18/14  Intake/Output from previous day: 07/05 0701 - 07/06 0700 In: 1866.7 [P.O.:30; I.V.:673.3; IV Piggyback:550; TPN:613.3] Out: 2500 [Urine:2500] Intake/Output this shift:    General appearance: no distress GI: soft nontender nondistended wounds clean abd soft, nt, nd  Lab Results:   Recent Labs  03/17/14 0450 03/19/14 0346  WBC 7.2 7.2  HGB 8.3* 7.7*  HCT 25.2* 23.0*  PLT 305 320   BMET  Recent Labs  03/18/14 0544 03/19/14 0346  NA 144 140  K 3.5* 3.5*  CL 106 100  CO2 26 25  GLUCOSE 95 108*  BUN 6 8  CREATININE 1.23* 1.28*  CALCIUM 9.6 9.7    Studies/Results: No results found.  Anti-infectives: Anti-infectives   Start     Dose/Rate Route Frequency Ordered Stop   03/10/14 2000  piperacillin-tazobactam (ZOSYN) IVPB 3.375 g     3.375 g 12.5 mL/hr over 240 Minutes Intravenous 3 times per day 03/10/14 1802        Assessment/Plan: Anastomotic leak s/p colostomy takedown  1. Iv pain meds as needed 2. pulm toilet, ambulate 3. Hypokalemia- replace today and recheck in am tomorrow 4. GI- gg enema with small anastomotic leak, there is not large enough collection to put another drain in right now, she does not require surgery or diversion right now. Will plan on bowel rest, TNA, Will get repeat CT later this week.  5. Lovenox, scds  Valerie Jackson 03/19/2014

## 2014-03-20 LAB — GLUCOSE, CAPILLARY
Glucose-Capillary: 132 mg/dL — ABNORMAL HIGH (ref 70–99)
Glucose-Capillary: 126 mg/dL — ABNORMAL HIGH (ref 70–99)
Glucose-Capillary: 125 mg/dL — ABNORMAL HIGH (ref 70–99)
Glucose-Capillary: 119 mg/dL — ABNORMAL HIGH (ref 70–99)

## 2014-03-20 LAB — BASIC METABOLIC PANEL
Anion gap: 15 (ref 5–15)
BUN: 10 mg/dL (ref 6–23)
CO2: 26 mEq/L (ref 19–32)
Calcium: 10.2 mg/dL (ref 8.4–10.5)
Chloride: 99 mEq/L (ref 96–112)
Creatinine, Ser: 1.35 mg/dL — ABNORMAL HIGH (ref 0.50–1.10)
GFR calc Af Amer: 45 mL/min — ABNORMAL LOW (ref >90)
GFR calc non Af Amer: 39 mL/min — ABNORMAL LOW (ref >90)
Glucose, Bld: 130 mg/dL — ABNORMAL HIGH (ref 70–99)
Potassium: 3.6 mEq/L — ABNORMAL LOW (ref 3.7–5.3)
Sodium: 140 mEq/L (ref 137–147)

## 2014-03-20 LAB — PHOSPHORUS: Phosphorus: 4.2 mg/dL (ref 2.3–4.6)

## 2014-03-20 LAB — TRIGLYCERIDES: Triglycerides: 245 mg/dL — ABNORMAL HIGH (ref <150)

## 2014-03-20 LAB — MAGNESIUM: Magnesium: 2.3 mg/dL (ref 1.5–2.5)

## 2014-03-20 MED ORDER — POTASSIUM CHLORIDE 10 MEQ/100ML IV SOLN
10.0000 meq | INTRAVENOUS | Status: AC
  Administered 2014-03-20 (×2): 10 meq via INTRAVENOUS
  Filled 2014-03-20 (×2): qty 100

## 2014-03-20 MED ORDER — M.V.I. ADULT IV INJ
INJECTION | INTRAVENOUS | Status: AC
  Administered 2014-03-20: 18:00:00 via INTRAVENOUS
  Filled 2014-03-20: qty 2000

## 2014-03-20 MED ORDER — FAT EMULSION 20 % IV EMUL
250.0000 mL | INTRAVENOUS | Status: AC
  Administered 2014-03-20: 250 mL via INTRAVENOUS
  Filled 2014-03-20: qty 250

## 2014-03-20 MED ORDER — KCL IN DEXTROSE-NACL 20-5-0.9 MEQ/L-%-% IV SOLN
INTRAVENOUS | Status: AC
  Administered 2014-03-21: 12:00:00 via INTRAVENOUS
  Filled 2014-03-20: qty 1000

## 2014-03-20 NOTE — Progress Notes (Signed)
PARENTERAL NUTRITION CONSULT NOTE  Pharmacy Consult for TNA Indication: anastomotic leak requiring bowel rest  Allergies  Allergen Reactions  . Estrogens     Blood clot in leg    Patient Measurements: Height: 5' 5"  (165.1 cm) Weight: 182 lb (82.555 kg) IBW/kg (Calculated) : 57 Adjusted Body Weight: 65 kg  Vital Signs: Temp: 98 F (36.7 C) (07/07 0515) Temp src: Oral (07/07 0515) BP: 116/78 mmHg (07/07 0515) Pulse Rate: 79 (07/07 0515) Intake/Output from previous day: 07/06 0701 - 07/07 0700 In: 2185.4 [P.O.:50; I.V.:630.4; IV Piggyback:400; TPN:1105] Out: 2400 [Urine:2400] Intake/Output from this shift:    Labs:  Recent Labs  03/19/14 0346  WBC 7.2  HGB 7.7*  HCT 23.0*  PLT 320     Recent Labs  03/18/14 0544 03/18/14 0545 03/19/14 0346 03/19/14 0405 03/20/14 0535  NA 144  --  140  --  140  K 3.5*  --  3.5*  --  3.6*  CL 106  --  100  --  99  CO2 26  --  25  --  26  GLUCOSE 95  --  108*  --  130*  BUN 6  --  8  --  10  CREATININE 1.23*  --  1.28*  --  1.35*  CALCIUM 9.6  --  9.7  --  10.2  MG  --  1.9 2.1  --  2.3  PHOS  --  3.4 3.7  --  4.2  PROT  --   --  7.1  --   --   ALBUMIN  --   --  3.1*  --   --   AST  --   --  35  --   --   ALT  --   --  39*  --   --   ALKPHOS  --   --  79  --   --   BILITOT  --   --  0.6  --   --   PREALBUMIN  --   --  28.6  --   --   TRIG  --   --   --  249* 245*  corrected Ca = 10.4 Estimated Creatinine Clearance: 41.7 ml/min (by C-G formula based on Cr of 1.35).    Recent Labs  03/19/14 1323 03/19/14 2305 03/20/14 0520  GLUCAP 120* 124* 132*    Insulin Requirements in the past 24 hours:  1 unit Novolog sensitive scale   Current Nutrition:  NPO Clinimix-E 5/20 at 50 mL/hr 20% Lipids at 10 mL/hr   IVF: D5-NS with KCl 20 mEq/L at 25 ml/hr  TPN Access: PICC, single lumen  Nutritional Goals:  From RD assessment 7/5 PM: 1800 - 1900 KCal/day 80 - 90 grams protein/day  Goal Rate and  Formula: Clinimix-E 5/20 at 70 mL/hr + Fat Emulsion 20% at 10 mL/hr to deliver 1958 KCal/day, 84 grams protein/day   Assessment:  1 yoF with anastomotic leak s/p colostomy [place].  Per surgery, plan is conservative management with bowel rest and TNA.  7/7   Beginning D#3 TPN  Glucose: no history of DM.  CBGs < 150, requiring minimal insulin.  Electrolytes: K 3.6 - small increase after KCl 61mq IV x 3 runs yesterday.  Other lytes WNL  Renal: CKD stage 2, SCr 1.35, has been stable  LFTs: minimal elevation of ALT on admission, stable.  TGs: 245 High but stable. Continue to monitor.  Prealbumin: 28.6 (7/6)  Plan:  1. KCl 151m IV q1h  x 2 2. At 1800 hr today:  Increase Clinimix to E 5/20 at 60 mL/hr  Continue Fat Emulsion 20% at 10 mL/hr  Continue standard multivitamins and trace elements in TNA.  Reduce IVF to 15 ml/hr.  Continue CBG checks q6h with sensitive scale SSI.  3. BMet, Mg, Phos tomorrow. 4. Over next few days as tolerated, increase Clinimix-E 5/20 to goal rate of 70 mL/hr.  Kizzie Furnish, PharmD Pager: [phone] 03/20/2014 8:38 AM

## 2014-03-20 NOTE — Progress Notes (Signed)
Patient ID: Valerie Jackson, female   DOB: 1946-02-04, 68 y.o.   MRN: [id number]    Subjective: Feels OK.     Objective: Vital signs in last 24 hours: Temp:  [98 F (36.7 C)-98.3 F (36.8 C)] 98 F (36.7 C) (07/07 0515) Pulse Rate:  [46-85] 79 (07/07 0515) Resp:  [16-20] 16 (07/07 0515) BP: (110-133)/(66-81) 116/78 mmHg (07/07 0515) SpO2:  [93 %-98 %] 97 % (07/07 0515) Last BM Date: 03/20/14  Intake/Output from previous day: 07/06 0701 - 07/07 0700 In: 2185.4 [P.O.:50; I.V.:630.4; IV Piggyback:400; TPN:1105] Out: 2400 [Urine:2400] Intake/Output this shift:    General appearance: no distress GI: soft nontender nondistended wounds clean abd soft, nt, nd  Lab Results:   Recent Labs  03/19/14 0346  WBC 7.2  HGB 7.7*  HCT 23.0*  PLT 320   BMET  Recent Labs  03/19/14 0346 03/20/14 0535  NA 140 140  K 3.5* 3.6*  CL 100 99  CO2 25 26  GLUCOSE 108* 130*  BUN 8 10  CREATININE 1.28* 1.35*  CALCIUM 9.7 10.2    Studies/Results: No results found.  Anti-infectives: Anti-infectives   Start     Dose/Rate Route Frequency Ordered Stop   03/10/14 2000  piperacillin-tazobactam (ZOSYN) IVPB 3.375 g     3.375 g 12.5 mL/hr over 240 Minutes Intravenous 3 times per day 03/10/14 1802        Assessment/Plan: Anastomotic leak s/p colostomy takedown  1. Iv pain meds as needed 2. pulm toilet, ambulate 3. Hypokalemia- replace today and recheck in am tomorrow 4. GI- gg enema with small anastomotic leak, there is not large enough collection to put another drain in right now, she does not require surgery or diversion right now. Will plan on bowel rest, TNA, Will get repeat CT later this week. Would plan go to LTAC Thursday/friday with PICC and TNA. 5. Lovenox, scds  Alfhild Partch 03/20/2014

## 2014-03-20 NOTE — Progress Notes (Signed)
CSW met with pt to assist with d/c planning. Pt now requires TNA. Pt does not feel this can be managed at home. SNF search initiated to locate facility that can accommodate TNA. Bed offers are pending and will be provided to pt later today.  Werner Lean LCSW [phone]

## 2014-03-20 NOTE — Progress Notes (Signed)
[facility]  Patient Status: New pt for [facility] this admission  [facility] is providing the following services:   HHRN and Home Infusion Pharmacy for home TNA.  [facility] Coordinator provided in hospital  Hands on teaching with pt today in room in preparation for possible DC home on TNA. Pt did very well. Pt was previously taught regarding TNA in 1/15 when she was for possible DC on TNA to home.  [place] team will continue to follow and support DC home when MD orders.  If patient discharges after hours, please call [phone].   Larry Sierras 03/20/2014, 11:07 PM

## 2014-03-21 ENCOUNTER — Inpatient Hospital Stay (HOSPITAL_COMMUNITY): Payer: Medicare Other

## 2014-03-21 LAB — GLUCOSE, CAPILLARY
Glucose-Capillary: 126 mg/dL — ABNORMAL HIGH (ref 70–99)
Glucose-Capillary: 128 mg/dL — ABNORMAL HIGH (ref 70–99)
Glucose-Capillary: 125 mg/dL — ABNORMAL HIGH (ref 70–99)
Glucose-Capillary: 128 mg/dL — ABNORMAL HIGH (ref 70–99)

## 2014-03-21 LAB — BASIC METABOLIC PANEL
Anion gap: 14 (ref 5–15)
BUN: UNDETERMINED mg/dL (ref 6–23)
BUN: 13 mg/dL (ref 6–23)
CO2: UNDETERMINED mEq/L (ref 19–32)
CO2: 24 mEq/L (ref 19–32)
Calcium: UNDETERMINED mg/dL (ref 8.4–10.5)
Calcium: 10.6 mg/dL — ABNORMAL HIGH (ref 8.4–10.5)
Chloride: UNDETERMINED mEq/L (ref 96–112)
Chloride: 98 mEq/L (ref 96–112)
Creatinine, Ser: UNDETERMINED mg/dL (ref 0.50–1.10)
Creatinine, Ser: 1.36 mg/dL — ABNORMAL HIGH (ref 0.50–1.10)
GFR calc Af Amer: 45 mL/min — ABNORMAL LOW (ref >90)
GFR calc non Af Amer: 39 mL/min — ABNORMAL LOW (ref >90)
Glucose, Bld: UNDETERMINED mg/dL (ref 70–99)
Glucose, Bld: 119 mg/dL — ABNORMAL HIGH (ref 70–99)
Potassium: UNDETERMINED mEq/L (ref 3.7–5.3)
Potassium: 4.2 mEq/L (ref 3.7–5.3)
Sodium: UNDETERMINED mEq/L (ref 137–147)
Sodium: 136 mEq/L — ABNORMAL LOW (ref 137–147)

## 2014-03-21 LAB — PHOSPHORUS
Phosphorus: UNDETERMINED mg/dL (ref 2.3–4.6)
Phosphorus: 3.8 mg/dL (ref 2.3–4.6)

## 2014-03-21 LAB — MAGNESIUM
Magnesium: UNDETERMINED mg/dL (ref 1.5–2.5)
Magnesium: 2.4 mg/dL (ref 1.5–2.5)

## 2014-03-21 MED ORDER — IOHEXOL 300 MG/ML  SOLN
100.0000 mL | Freq: Once | INTRAMUSCULAR | Status: AC | PRN
  Administered 2014-03-21: 100 mL via INTRAVENOUS

## 2014-03-21 MED ORDER — FAT EMULSION 20 % IV EMUL
250.0000 mL | INTRAVENOUS | Status: DC
  Administered 2014-03-21: 250 mL via INTRAVENOUS
  Filled 2014-03-21: qty 250

## 2014-03-21 MED ORDER — FLUCONAZOLE 200 MG PO TABS
200.0000 mg | ORAL_TABLET | Freq: Once | ORAL | Status: AC
  Administered 2014-03-21: 200 mg via ORAL
  Filled 2014-03-21: qty 1

## 2014-03-21 MED ORDER — KCL IN DEXTROSE-NACL 20-5-0.9 MEQ/L-%-% IV SOLN
INTRAVENOUS | Status: DC
Start: 2014-03-21 — End: 2014-03-22
  Filled 2014-03-21: qty 1000

## 2014-03-21 MED ORDER — TRACE MINERALS CR-CU-F-FE-I-MN-MO-SE-ZN IV SOLN
INTRAVENOUS | Status: DC
  Administered 2014-03-21: 18:00:00 via INTRAVENOUS
  Filled 2014-03-21: qty 2000

## 2014-03-21 NOTE — Progress Notes (Signed)
PARENTERAL NUTRITION CONSULT NOTE  Pharmacy Consult for TNA Indication: anastomotic leak requiring bowel rest  Allergies  Allergen Reactions  . Estrogens     Blood clot in leg    Patient Measurements: Height: 5' 5"  (165.1 cm) Weight: 182 lb (82.555 kg) IBW/kg (Calculated) : 57 Adjusted Body Weight: 65 kg  Vital Signs: Temp: 97.6 F (36.4 C) (07/08 0558) Temp src: Oral (07/08 0558) BP: 100/61 mmHg (07/08 0558) Pulse Rate: 85 (07/08 0558) Intake/Output from previous day: 07/07 0701 - 07/08 0700 In: 2355.8 [I.V.:460.8; IV Piggyback:400; OZD:6644] Out: 1600 [Urine:1600] Intake/Output from this shift:    Labs:  Recent Labs  03/19/14 0346  WBC 7.2  HGB 7.7*  HCT 23.0*  PLT 320     Recent Labs  03/19/14 0346 03/19/14 0405 03/20/14 0535 03/21/14 0510  NA 140  --  140 SPECIMEN CONTAMINATED, UNABLE TO PERFORM TEST(S).  K 3.5*  --  3.6* SPECIMEN CONTAMINATED, UNABLE TO PERFORM TEST(S).  CL 100  --  99 SPECIMEN CONTAMINATED, UNABLE TO PERFORM TEST(S).  CO2 25  --  26 SPECIMEN CONTAMINATED, UNABLE TO PERFORM TEST(S).  GLUCOSE 108*  --  130* SPECIMEN CONTAMINATED, UNABLE TO PERFORM TEST(S).  BUN 8  --  10 SPECIMEN CONTAMINATED, UNABLE TO PERFORM TEST(S).  CREATININE 1.28*  --  1.35* SPECIMEN CONTAMINATED, UNABLE TO PERFORM TEST(S).  CALCIUM 9.7  --  10.2 SPECIMEN CONTAMINATED, UNABLE TO PERFORM TEST(S).  MG 2.1  --  2.3 SPECIMEN CONTAMINATED, UNABLE TO PERFORM TEST(S).  PHOS 3.7  --  4.2 SPECIMEN CONTAMINATED, UNABLE TO PERFORM TEST(S).  PROT 7.1  --   --   --   ALBUMIN 3.1*  --   --   --   AST 35  --   --   --   ALT 39*  --   --   --   ALKPHOS 79  --   --   --   BILITOT 0.6  --   --   --   PREALBUMIN 28.6  --   --   --   TRIG  --  249* 245*  --   corrected Ca = 10.4 CrCl cannot be calculated (Patient has no sCr result on file.).    Recent Labs  03/20/14 1748 03/20/14 2300 03/21/14 0526  GLUCAP 125* 119* 126*    Insulin Requirements in the past 24  hours:  3 units Novolog sensitive scale  Current Nutrition:  NPO Clinimix-E 5/20 at 60 mL/hr 20% Lipids at 10 mL/hr  IVF: D5-NS with KCl 20 mEq/L at 15 ml/hr  TPN Access: PICC, double lumen  Nutritional Goals:  From RD assessment 7/5 PM: 1800 - 1900 KCal/day 80 - 90 grams protein/day  Goal Rate and Formula: Clinimix-E 5/20 at 70 mL/hr + Fat Emulsion 20% at 10 mL/hr to deliver 1958 KCal/day, 84 grams protein/day  Assessment:  71 yoF with anastomotic leak s/p colostomy [place].  Per surgery, plan is conservative management with bowel rest and TNA.  Likely discharge 7/9-10 with PICC and TNA.  7/8   Beginning D#4 TPN  Glucose: no history of DM.  CBGs < 150, requiring minimal insulin.  Electrolytes: Na slightly low, K+ WNL after replacement, other lytes WNL  Renal: CKD stage 2, SCr 1.36, slightly up from admission  LFTs: minimal elevation of ALT, other LFTs WNL (7/6)  TGs: 245 (7/7), elevated but not high enough to adjust fat emulsion orders. Continue to monitor.  Prealbumin: 28.6 (7/6)  Plan:  1. At 1800 hr today:  Increase Clinimix to E 5/20 goal rate of 70 mL/hr  Continue Fat Emulsion 20% at 10 mL/hr  Continue standard multivitamins and trace elements in TNA.  Reduce IVF to [facility].  Continue CBG checks q6h with sensitive scale SSI.  2. TNA labs in AM.  Hershal Coria, PharmD, BCPS Pager: [phone] 03/21/2014 7:12 AM

## 2014-03-21 NOTE — Progress Notes (Signed)
CSW assisting with d/c planning. RN liaison from [employer] met with pt / daughter today to answer questions. Pt will accept placement at either [place] or [place] facility depending on which one has a pvt room at d/c. PN reviewed. MD notes pt may be ready for d/c Thurs / Fri. SNF has been notified and will begin preparing for pt.  Werner Lean LCSW [phone]

## 2014-03-21 NOTE — Progress Notes (Signed)
Notified Md that patient vaginal area is red, irritated and itchy, MD stated to enter order for Diflucan 251m once, order entered [id number] MendsRN 03-21-2014 16:31pm

## 2014-03-21 NOTE — Progress Notes (Signed)
Patient ID: Valerie Jackson, female   DOB: 1946/01/28, 68 y.o.   MRN: [id number]    Subjective: No overnight events.   Objective: Vital signs in last 24 hours: Temp:  [97.6 F (36.4 C)-98.2 F (36.8 C)] 98 F (36.7 C) (07/08 1433) Pulse Rate:  [83-87] 83 (07/08 1433) Resp:  [16-18] 18 (07/08 1433) BP: (100-126)/(61-83) 103/67 mmHg (07/08 1433) SpO2:  [97 %-100 %] 99 % (07/08 1433) Last BM Date: 03/20/14  Intake/Output from previous day: 07/07 0701 - 07/08 0700 In: 2355.8 [I.V.:460.8; IV Piggyback:400; KWI:0973] Out: 1600 [Urine:1600] Intake/Output this shift: Total I/O In: 0  Out: 450 [Urine:450]  General appearance: no distress GI: soft nontender nondistended wounds clean abd soft, nt, nd  Lab Results:   Recent Labs  03/19/14 0346  WBC 7.2  HGB 7.7*  HCT 23.0*  PLT 320   BMET  Recent Labs  03/21/14 0510 03/21/14 0735  NA SPECIMEN CONTAMINATED, UNABLE TO PERFORM TEST(S). 136*  K SPECIMEN CONTAMINATED, UNABLE TO PERFORM TEST(S). 4.2  CL SPECIMEN CONTAMINATED, UNABLE TO PERFORM TEST(S). 98  CO2 SPECIMEN CONTAMINATED, UNABLE TO PERFORM TEST(S). 24  GLUCOSE SPECIMEN CONTAMINATED, UNABLE TO PERFORM TEST(S). 119*  BUN SPECIMEN CONTAMINATED, UNABLE TO PERFORM TEST(S). 13  CREATININE SPECIMEN CONTAMINATED, UNABLE TO PERFORM TEST(S). 1.36*  CALCIUM SPECIMEN CONTAMINATED, UNABLE TO PERFORM TEST(S). 10.6*    Studies/Results: No results found.  Anti-infectives: Anti-infectives   Start     Dose/Rate Route Frequency Ordered Stop   03/10/14 2000  piperacillin-tazobactam (ZOSYN) IVPB 3.375 g     3.375 g 12.5 mL/hr over 240 Minutes Intravenous 3 times per day 03/10/14 1802        Assessment/Plan: Anastomotic leak s/p colostomy takedown  1. Iv pain meds as needed 2. pulm toilet, ambulate 3. Hypokalemia- replace today and recheck in am tomorrow. 4. GI- gg enema with small anastomotic leak.  Repeat CT scan today.  If no fluid collection large enough to drain, would  d/c tomorrow to LTAC with TNA.   5. Lovenox, scds  Valerie Jackson 03/21/2014

## 2014-03-22 DIAGNOSIS — K630 Abscess of intestine: Secondary | ICD-10-CM | POA: Diagnosis not present

## 2014-03-22 DIAGNOSIS — K219 Gastro-esophageal reflux disease without esophagitis: Secondary | ICD-10-CM | POA: Diagnosis not present

## 2014-03-22 DIAGNOSIS — R5381 Other malaise: Secondary | ICD-10-CM | POA: Diagnosis not present

## 2014-03-22 DIAGNOSIS — N731 Chronic parametritis and pelvic cellulitis: Secondary | ICD-10-CM | POA: Diagnosis not present

## 2014-03-22 DIAGNOSIS — K651 Peritoneal abscess: Secondary | ICD-10-CM | POA: Diagnosis not present

## 2014-03-22 DIAGNOSIS — G5700 Lesion of sciatic nerve, unspecified lower limb: Secondary | ICD-10-CM | POA: Diagnosis not present

## 2014-03-22 DIAGNOSIS — J438 Other emphysema: Secondary | ICD-10-CM | POA: Diagnosis not present

## 2014-03-22 DIAGNOSIS — K759 Inflammatory liver disease, unspecified: Secondary | ICD-10-CM | POA: Diagnosis not present

## 2014-03-22 DIAGNOSIS — F411 Generalized anxiety disorder: Secondary | ICD-10-CM | POA: Diagnosis not present

## 2014-03-22 DIAGNOSIS — F3289 Other specified depressive episodes: Secondary | ICD-10-CM | POA: Diagnosis not present

## 2014-03-22 DIAGNOSIS — I82409 Acute embolism and thrombosis of unspecified deep veins of unspecified lower extremity: Secondary | ICD-10-CM | POA: Diagnosis not present

## 2014-03-22 DIAGNOSIS — K929 Disease of digestive system, unspecified: Secondary | ICD-10-CM | POA: Diagnosis not present

## 2014-03-22 DIAGNOSIS — F329 Major depressive disorder, single episode, unspecified: Secondary | ICD-10-CM | POA: Diagnosis not present

## 2014-03-22 DIAGNOSIS — Z5189 Encounter for other specified aftercare: Secondary | ICD-10-CM | POA: Diagnosis not present

## 2014-03-22 DIAGNOSIS — R011 Cardiac murmur, unspecified: Secondary | ICD-10-CM | POA: Diagnosis not present

## 2014-03-22 DIAGNOSIS — R51 Headache: Secondary | ICD-10-CM | POA: Diagnosis not present

## 2014-03-22 DIAGNOSIS — N189 Chronic kidney disease, unspecified: Secondary | ICD-10-CM | POA: Diagnosis not present

## 2014-03-22 DIAGNOSIS — E669 Obesity, unspecified: Secondary | ICD-10-CM | POA: Diagnosis not present

## 2014-03-22 DIAGNOSIS — I10 Essential (primary) hypertension: Secondary | ICD-10-CM | POA: Diagnosis not present

## 2014-03-22 DIAGNOSIS — Z8739 Personal history of other diseases of the musculoskeletal system and connective tissue: Secondary | ICD-10-CM | POA: Diagnosis not present

## 2014-03-22 DIAGNOSIS — N182 Chronic kidney disease, stage 2 (mild): Secondary | ICD-10-CM | POA: Diagnosis not present

## 2014-03-22 DIAGNOSIS — D573 Sickle-cell trait: Secondary | ICD-10-CM | POA: Diagnosis not present

## 2014-03-22 DIAGNOSIS — E039 Hypothyroidism, unspecified: Secondary | ICD-10-CM | POA: Diagnosis not present

## 2014-03-22 DIAGNOSIS — K5732 Diverticulitis of large intestine without perforation or abscess without bleeding: Secondary | ICD-10-CM | POA: Diagnosis not present

## 2014-03-22 DIAGNOSIS — J449 Chronic obstructive pulmonary disease, unspecified: Secondary | ICD-10-CM | POA: Diagnosis not present

## 2014-03-22 DIAGNOSIS — R42 Dizziness and giddiness: Secondary | ICD-10-CM | POA: Diagnosis not present

## 2014-03-22 DIAGNOSIS — Z452 Encounter for adjustment and management of vascular access device: Secondary | ICD-10-CM | POA: Diagnosis not present

## 2014-03-22 DIAGNOSIS — R5383 Other fatigue: Secondary | ICD-10-CM | POA: Diagnosis not present

## 2014-03-22 DIAGNOSIS — B3781 Candidal esophagitis: Secondary | ICD-10-CM | POA: Diagnosis not present

## 2014-03-22 DIAGNOSIS — Z111 Encounter for screening for respiratory tuberculosis: Secondary | ICD-10-CM | POA: Diagnosis not present

## 2014-03-22 DIAGNOSIS — D649 Anemia, unspecified: Secondary | ICD-10-CM | POA: Diagnosis not present

## 2014-03-22 DIAGNOSIS — E785 Hyperlipidemia, unspecified: Secondary | ICD-10-CM | POA: Diagnosis not present

## 2014-03-22 LAB — COMPREHENSIVE METABOLIC PANEL
ALT: 32 U/L (ref 0–35)
AST: 26 U/L (ref 0–37)
Albumin: 3.4 g/dL — ABNORMAL LOW (ref 3.5–5.2)
Alkaline Phosphatase: 77 U/L (ref 39–117)
Anion gap: 16 — ABNORMAL HIGH (ref 5–15)
BUN: 17 mg/dL (ref 6–23)
CO2: 24 mEq/L (ref 19–32)
Calcium: 10.3 mg/dL (ref 8.4–10.5)
Chloride: 99 mEq/L (ref 96–112)
Creatinine, Ser: 1.35 mg/dL — ABNORMAL HIGH (ref 0.50–1.10)
GFR calc Af Amer: 45 mL/min — ABNORMAL LOW (ref >90)
GFR calc non Af Amer: 39 mL/min — ABNORMAL LOW (ref >90)
Glucose, Bld: 124 mg/dL — ABNORMAL HIGH (ref 70–99)
Potassium: 3.6 mEq/L — ABNORMAL LOW (ref 3.7–5.3)
Sodium: 139 mEq/L (ref 137–147)
Total Bilirubin: 0.5 mg/dL (ref 0.3–1.2)
Total Protein: 8.1 g/dL (ref 6.0–8.3)

## 2014-03-22 LAB — PHOSPHORUS: Phosphorus: 4.3 mg/dL (ref 2.3–4.6)

## 2014-03-22 LAB — MAGNESIUM: Magnesium: 2.3 mg/dL (ref 1.5–2.5)

## 2014-03-22 LAB — GLUCOSE, CAPILLARY
Glucose-Capillary: 118 mg/dL — ABNORMAL HIGH (ref 70–99)
Glucose-Capillary: 121 mg/dL — ABNORMAL HIGH (ref 70–99)

## 2014-03-22 MED ORDER — FAT EMULSION 20 % IV EMUL
250.0000 mL | INTRAVENOUS | Status: DC
  Filled 2014-03-22: qty 250

## 2014-03-22 MED ORDER — POTASSIUM CHLORIDE 10 MEQ/100ML IV SOLN
10.0000 meq | INTRAVENOUS | Status: AC
  Administered 2014-03-22 (×2): 10 meq via INTRAVENOUS
  Filled 2014-03-22 (×2): qty 100

## 2014-03-22 MED ORDER — HEPARIN SOD (PORK) LOCK FLUSH 100 UNIT/ML IV SOLN
250.0000 [IU] | INTRAVENOUS | Status: DC | PRN
  Administered 2014-03-22: 17:00:00

## 2014-03-22 MED ORDER — PIPERACILLIN-TAZOBACTAM 3.375 G IVPB: 3.3750 g | Freq: Three times a day (TID) | INTRAVENOUS | Status: DC

## 2014-03-22 MED ORDER — TRACE MINERALS CR-CU-F-FE-I-MN-MO-SE-ZN IV SOLN
INTRAVENOUS | Status: DC
  Filled 2014-03-22: qty 2000

## 2014-03-22 MED ORDER — ZOLPIDEM TARTRATE 5 MG PO TABS: 5.0000 mg | ORAL_TABLET | Freq: Every evening | ORAL | Status: DC | PRN

## 2014-03-22 MED ORDER — OXYCODONE HCL 10 MG PO TABS: 10.0000 mg | ORAL_TABLET | ORAL | Status: DC | PRN

## 2014-03-22 MED ORDER — SODIUM CHLORIDE 0.9 % IV SOLN
3.0000 g | Freq: Four times a day (QID) | INTRAVENOUS | Status: DC
  Filled 2014-03-22: qty 3

## 2014-03-22 NOTE — Discharge Instructions (Signed)
Call for fevers/ chills/increasing pain.  Avoid strenuous activity.  Continue TNA. Nothing by mouth except for sips with meds and hard candy OK.

## 2014-03-22 NOTE — Discharge Summary (Signed)
Physician Discharge Summary  Patient ID: Valerie Jackson MRN: [id number] DOB/AGE: [birth year] 68 y.o.  Admit date: 03/10/2014 Discharge date: 03/22/2014  Admission Diagnoses: Patient Active Problem List   Diagnosis Date Noted  . Pelvic abscess in female 03/10/2014  . Parastomal hernia s/p colostomy takedown & repair 02/27/2014 03/03/2014  . CKD (chronic kidney disease) stage 2, GFR 60-89 ml/min 03/03/2014  . Diverticulitis 02/27/2014  . Chest x-ray abnormality 11/29/2013  . COPD (chronic obstructive pulmonary disease) with emphysema 11/08/2013  . Nonspecific elevation of levels of transaminase or lactic acid dehydrogenase (LDH) 07/19/2013  . Sickle cell trait 07/19/2013  . Obesity (BMI 30-39.9) 04/12/2013  . Other and unspecified hyperlipidemia 02/28/2013  . Hypertension 02/28/2013  . Unspecified hypothyroidism 02/28/2013    Discharge Diagnoses:  Same + anastamotic leak  Discharged Condition: stable  Hospital Course:  Pt admitted with pelvic abscess following colostomy takedown.  A perc drain was placed.  She got severe sciatic neuropathy from the drain so it was pulled.  This improved.  She was re scanned, and was found to have some air, but no significant fluid collection, so a new drain could not be placed.  This was examined with a gastrograffin enema, and it was found to be a small leak.  She was placed on TNA and made NPO.  Repeat scan showed air pocket near anastamosis is smaller.  She is stable.  We will continue her on TNA with NPO, and continue antibiotics for another week.  She is ambulatory and able to void.    Consults: pharmacy  Significant Diagnostic Studies: labs: normal WBCs.  Mild hypokalemia that improved.    Treatments: TNA, antibiotics, drain (now removed)  Discharge Exam: Blood pressure 111/63, pulse 93, temperature 98.1 F (36.7 C), temperature source Oral, resp. rate 16, height 5' 5"  (1.651 m), weight 182 lb (82.555 kg), SpO2 100.00%. General appearance:  alert, cooperative and no distress Resp: breathing comfortably GI: soft, non-tender; bowel sounds normal; no masses,  no organomegaly  Disposition: 01-Home or Self Care  Discharge Instructions   TNA per pharmacy consult    Complete by:  As directed             Medication List         cholecalciferol 1000 UNITS tablet  Commonly known as:  VITAMIN D  Take 1 tablet (1,000 Units total) by mouth daily.     hydrochlorothiazide 12.5 MG capsule  Commonly known as:  MICROZIDE  Take 1 capsule (12.5 mg total) by mouth every morning.     levothyroxine 125 MCG tablet  Commonly known as:  SYNTHROID, LEVOTHROID  Take 1 tablet (125 mcg total) by mouth daily before breakfast.     LORazepam 0.5 MG tablet  Commonly known as:  ATIVAN  Take 1-2 tablets (0.5-1 mg total) by mouth every 8 (eight) hours as needed for anxiety.     meclizine 12.5 MG tablet  Commonly known as:  ANTIVERT  Take 12.5 mg by mouth 3 (three) times daily as needed for dizziness (pt does take one dose every morning).     nystatin 100000 UNIT/ML suspension  Commonly known as:  MYCOSTATIN  Take 5 mLs (500,000 Units total) by mouth 4 (four) times daily.     Oxycodone HCl 10 MG Tabs  Take 1-1.5 tablets (10-15 mg total) by mouth every 4 (four) hours as needed.     piperacillin-tazobactam 3.375 GM/50ML IVPB  Commonly known as:  ZOSYN  Inject 50 mLs (3.375 g total) into the vein  every 8 (eight) hours.     Tiotropium Bromide Monohydrate 2.5 MCG/ACT Aers  Commonly known as:  SPIRIVA RESPIMAT  Inhale 2 puffs into the lungs daily.     zolpidem 5 MG tablet  Commonly known as:  AMBIEN  Take 1 tablet (5 mg total) by mouth at bedtime as needed for sleep.           Follow-up Information   Follow up with [facility], MD. Schedule an appointment as soon as possible for a visit in 2 weeks.   Specialty:  General Surgery   Contact information:   [address] [place] [address] [phone]        Signed: Stark Klein 03/22/2014, 7:01 AM

## 2014-03-22 NOTE — Progress Notes (Signed)
Nurse reviewed discharge instructions with pt before discharge.  No concerns at time of discharge.  Pt given discharge packet to give to staff at [facility].  Pt verbalized understanding of follow up appointments and discharge instructions.  Report called to nurse at [facility] facility.

## 2014-03-22 NOTE — Progress Notes (Signed)
PARENTERAL NUTRITION CONSULT NOTE  Pharmacy Consult for TNA Indication: anastomotic leak requiring bowel rest  Allergies  Allergen Reactions  . Estrogens     Blood clot in leg    Patient Measurements: Height: 5' 5"  (165.1 cm) Weight: 182 lb (82.555 kg) IBW/kg (Calculated) : 57 Adjusted Body Weight: 65 kg  Vital Signs: Temp: 98.1 F (36.7 C) (07/09 0520) Temp src: Oral (07/09 0520) BP: 111/63 mmHg (07/09 0520) Pulse Rate: 93 (07/09 0520) Intake/Output from previous day: 07/08 0701 - 07/09 0700 In: 120.5 [I.V.:120.5] Out: 1950 [Urine:1950] Intake/Output from this shift: Total I/O In: 120.5 [I.V.:120.5] Out: 1000 [Urine:1000]  Labs: No results found for this basename: WBC, HGB, HCT, PLT, APTT, INR,  in the last 72 hours   Recent Labs  03/20/14 0535 03/21/14 0510 03/21/14 0735 03/22/14 0500  NA 140 SPECIMEN CONTAMINATED, UNABLE TO PERFORM TEST(S). 136* 139  K 3.6* SPECIMEN CONTAMINATED, UNABLE TO PERFORM TEST(S). 4.2 3.6*  CL 99 SPECIMEN CONTAMINATED, UNABLE TO PERFORM TEST(S). 98 99  CO2 26 SPECIMEN CONTAMINATED, UNABLE TO PERFORM TEST(S). 24 24  GLUCOSE 130* SPECIMEN CONTAMINATED, UNABLE TO PERFORM TEST(S). 119* 124*  BUN 10 SPECIMEN CONTAMINATED, UNABLE TO PERFORM TEST(S). 13 17  CREATININE 1.35* SPECIMEN CONTAMINATED, UNABLE TO PERFORM TEST(S). 1.36* 1.35*  CALCIUM 10.2 SPECIMEN CONTAMINATED, UNABLE TO PERFORM TEST(S). 10.6* 10.3  MG 2.3 SPECIMEN CONTAMINATED, UNABLE TO PERFORM TEST(S). 2.4 2.3  PHOS 4.2 SPECIMEN CONTAMINATED, UNABLE TO PERFORM TEST(S). 3.8 4.3  PROT  --   --   --  8.1  ALBUMIN  --   --   --  3.4*  AST  --   --   --  26  ALT  --   --   --  32  ALKPHOS  --   --   --  77  BILITOT  --   --   --  0.5  TRIG 245*  --   --   --   corrected Ca = 10.8 Estimated Creatinine Clearance: 41.7 ml/min (by C-G formula based on Cr of 1.35).    Recent Labs  03/21/14 1304 03/21/14 1648 03/21/14 2307  GLUCAP 128* 125* 128*    Nutritional Goals:   From RD assessment 7/5 PM: 1800 - 1900 KCal/day 80 - 90 grams protein/day  Goal Rate and Formula: Clinimix-E 5/20 at 70 mL/hr + Fat Emulsion 20% at 10 mL/hr to deliver 1958 KCal/day, 84 grams protein/day  Assessment:  86 yoF with anastomotic leak s/p colostomy [place].  Per surgery, plan is for conservative management with bowel rest and TNA.  Likely discharge 7/9-10 with PICC and TNA.  7/9   Beginning D#5 TPN TPN Access: PICC, double lumen  Insulin Requirements in the past 24 hours:  4 units Novolog sensitive scale  Current Nutrition:  NPO Clinimix-E 5/20 at 70 mL/hr 20% Lipids at 10 mL/hr  IVF: D5-NS with KCl 20 mEq/L at 10 ml/hr  Labs:  Glucose: no history of DM.  CBGs < 150, requiring minimal insulin.  Electrolytes: K slightly low, others WNL  Renal: CKD stage 2, SCr slightly elevated from admission  LFTs: Transaminase elevations resolved.  All LFTs now < ULN.  TGs: 245 (7/7), elevated but not high enough to adjust fat emulsion orders. Continue to monitor.  Prealbumin: 28.6 (7/6) - WNL, but could also reflect some artifactual elevation due to CKD.  Plan:  1. KCl 10 mEq IV q1h x 2 today 2. Continue Clinimix to E 5/20 goal rate of 70 mL/hr 3. Continue Fat Emulsion  20% at 10 mL/hr 4. Continue standard multivitamins and trace elements in TNA. 5. Continue CBG checks q6h with sensitive scale SSI.  6. BMet tomorrow. 7. Routine TNA labs every Monday and Thursday.  Clayburn Pert, PharmD, BCPS Pager: [phone] 03/22/2014  6:27 AM

## 2014-03-22 NOTE — Progress Notes (Signed)
Clinical Social Work Department CLINICAL SOCIAL WORK PLACEMENT NOTE 03/22/2014  Patient:  Valerie Jackson, Valerie Jackson  Account Number:  [id number] Admit date:  03/10/2014  Clinical Social Worker:  Werner Lean, LCSW  Date/time:  03/13/2014 09:00 AM  Clinical Social Work is seeking post-discharge placement for this patient at the following level of care:   [place]   (*CSW will update this form in Epic as items are completed)   03/13/2014  Patient/family provided with [place] Department of Clinical Social Work's list of facilities offering this level of care within the geographic area requested by the patient (or if unable, by the patient's family).  03/13/2014  Patient/family informed of their freedom to choose among providers that offer the needed level of care, that participate in Medicare, Medicaid or managed care program needed by the patient, have an available bed and are willing to accept the patient.    Patient/family informed of [facility]' ownership interest in [facility], as well as of the fact that they are under no obligation to receive care at this facility.  PASARR submitted to EDS on 03/13/2014 PASARR number received on 03/13/2014  FL2 transmitted to all facilities in geographic area requested by pt/family on  03/13/2014 FL2 transmitted to all facilities within larger geographic area on   Patient informed that his/her managed care company has contracts with or will negotiate with  certain facilities, including the following:     Patient/family informed of bed offers received:  03/13/2014 Patient chooses bed at [facility] [place] Physician recommends and patient chooses bed at    Patient to be transferred to [place] on  03/22/2014 Patient to be transferred to facility by Family Patient and family notified of transfer on 03/22/2014 Name of family member notified:  Daughter  The following physician request were  entered in Epic:   Additional Comments: Pt / daughter in agreement with d/c to SNF today. daughter will transport. Nsg reviewed d/c summary, scripts, avs. Scripts included in d/c packet.  Werner Lean LCSW [phone]

## 2014-03-23 ENCOUNTER — Non-Acute Institutional Stay (SKILLED_NURSING_FACILITY): Payer: Medicare Other | Admitting: Internal Medicine

## 2014-03-23 DIAGNOSIS — M545 Low back pain, unspecified: Secondary | ICD-10-CM

## 2014-03-23 DIAGNOSIS — R5381 Other malaise: Secondary | ICD-10-CM

## 2014-03-23 DIAGNOSIS — F411 Generalized anxiety disorder: Secondary | ICD-10-CM | POA: Diagnosis not present

## 2014-03-23 DIAGNOSIS — G4700 Insomnia, unspecified: Secondary | ICD-10-CM

## 2014-03-23 DIAGNOSIS — E039 Hypothyroidism, unspecified: Secondary | ICD-10-CM | POA: Diagnosis not present

## 2014-03-23 DIAGNOSIS — R42 Dizziness and giddiness: Secondary | ICD-10-CM | POA: Diagnosis not present

## 2014-03-23 DIAGNOSIS — I10 Essential (primary) hypertension: Secondary | ICD-10-CM

## 2014-03-23 DIAGNOSIS — N731 Chronic parametritis and pelvic cellulitis: Secondary | ICD-10-CM

## 2014-03-23 DIAGNOSIS — D62 Acute posthemorrhagic anemia: Secondary | ICD-10-CM

## 2014-03-23 DIAGNOSIS — R5383 Other fatigue: Secondary | ICD-10-CM

## 2014-03-23 DIAGNOSIS — R531 Weakness: Secondary | ICD-10-CM

## 2014-03-23 DIAGNOSIS — N739 Female pelvic inflammatory disease, unspecified: Secondary | ICD-10-CM

## 2014-03-23 NOTE — Progress Notes (Signed)
Patient ID: Valerie Jackson, female   DOB: 1946/02/09, 68 y.o.   MRN: [id number]     Facility: [facility]   PCP: [name], MD  Code Status: full code  Allergies  Allergen Reactions  . Estrogens     Blood clot in leg    Chief Complaint: new admission  HPI:  68 y/o female patient is here for STR after hospital admission from 03/10/14-03/22/14 with pelvic abscess following colostomy takedown. She had a percutaneous drain placed and then this was complicated by sciatic neuropathy. The drain was then removed and pain improved. She was started on TPN and is NPO for now. She is on antibiotics via PICC line and her for rehabilitation. She is seen in her room with her daughter at her bedside. She feels weak and tired and has been dizzy with change of position.  She complaints of occassional nausea and has heartburn Gets winded easily  Review of Systems:  Constitutional: Negative for fever, chills, diaphoresis.  HENT: Negative for congestion, hearing loss and sore throat.   Eyes: Negative for eye pain, blurred vision, double vision and discharge.  Respiratory: Negative for cough, sputum production, wheezing.   Cardiovascular: Negative for chest pain, palpitations, orthopnea and leg swelling.  Gastrointestinal: Negative for vomiting, abdominal pain, diarrhea and constipation.  Genitourinary: Negative for dysuria, urgency, frequency, hematuria and flank pain.  Musculoskeletal: some discomfort 3/10 in lower back region, no radiation. Negative for falls, joint pain and myalgias.  Skin: Negative for itching and rash.  Neurological: Negative for tingling, focal weakness and headaches.  Psychiatric/Behavioral: Negative for depression and memory loss. The patient is not nervous/anxious.     Past Medical History  Diagnosis Date  . Hypertension   . Hypothyroid   . Depression   . Hypercholesteremia   . Sickle cell trait   . GERD (gastroesophageal reflux disease)   .  History of blood transfusion 1965  . Diverticulitis 09/2013    with perforation  . Pericolonic abscess 2015  . Obesity   . Hypokalemia   . H/O hiatal hernia   . Arthritis     fingers  . Headache(784.0)     migraine every 5- to 6 years  . Blood clot in vein 1968  . Hepatitis age 27 or 65    had heapatitis, not sure which kind  . Heart murmur since age 67    mild  . Diverticulitis of colon with perforation 09/28/2013   Past Surgical History  Procedure Laterality Date  . Cholecystectomy  2002  . Tonsillectomy  1952  . Colon resection N/A 10/11/2013    Procedure: EXPLORATORY LAPAROTOMY WITH RECTOSIGMOID COLECTOMY AND END COLOSTOMY;  Surgeon: Stark Klein, MD;  Location: [facility];  Service: General;  Laterality: N/A;  . Colostomy takedown N/A 02/27/2014    Procedure: COLOSTOMY TAKEDOWN [name] HERNIA REPAIR;  Surgeon: Stark Klein, MD;  Location: [facility];  Service: General;  Laterality: N/A;   Social History:   reports that she quit smoking about 6 months ago. Her smoking use included Cigarettes. She has a 6.25 pack-year smoking history. She has never used smokeless tobacco. She reports that she does not drink alcohol or use illicit drugs.  Family History  Problem Relation Age of Onset  . Hypertension Mother   . Aneurysm Mother   . Cancer Mother     Uterine  . Thyroid cancer Mother   . Cancer Other   . Thyroid cancer Sister   . Arthritis Sister   .  Hyperlipidemia Sister   . Hypertension Sister   . Stroke Sister   . Kidney disease Sister   . Hyperlipidemia Brother   . Thyroid cancer Daughter   . Breast cancer Maternal Grandmother   . Hypertension Maternal Grandmother     Medications: Patient's Medications  New Prescriptions   No medications on file  Previous Medications   CHOLECALCIFEROL (VITAMIN D) 1000 UNITS TABLET    Take 1 tablet (1,000 Units total) by mouth daily.   HYDROCHLOROTHIAZIDE (MICROZIDE) 12.5 MG CAPSULE    Take 1 capsule (12.5 mg total) by mouth every  morning.   LEVOTHYROXINE (SYNTHROID, LEVOTHROID) 125 MCG TABLET    Take 1 tablet (125 mcg total) by mouth daily before breakfast.   LORAZEPAM (ATIVAN) 0.5 MG TABLET    Take 1-2 tablets (0.5-1 mg total) by mouth every 8 (eight) hours as needed for anxiety.   MECLIZINE (ANTIVERT) 12.5 MG TABLET    Take 12.5 mg by mouth 3 (three) times daily as needed for dizziness (pt does take one dose every morning).   NYSTATIN (MYCOSTATIN) 100000 UNIT/ML SUSPENSION    Take 5 mLs (500,000 Units total) by mouth 4 (four) times daily.   OXYCODONE HCL 10 MG TABS    Take 1-1.5 tablets (10-15 mg total) by mouth every 4 (four) hours as needed.   PIPERACILLIN-TAZOBACTAM (ZOSYN) 3.375 GM/50ML IVPB    Inject 50 mLs (3.375 g total) into the vein every 8 (eight) hours.   TIOTROPIUM BROMIDE MONOHYDRATE (SPIRIVA RESPIMAT) 2.5 MCG/ACT AERS    Inhale 2 puffs into the lungs daily.   ZOLPIDEM (AMBIEN) 5 MG TABLET    Take 1 tablet (5 mg total) by mouth at bedtime as needed for sleep.  Modified Medications   No medications on file  Discontinued Medications   No medications on file     Physical Exam: Filed Vitals:   03/23/14 1518  BP: 111/65  Pulse: 88  Temp: 97.3 F (36.3 C)  Resp: 18  Weight: 177 lb (80.287 kg)  SpO2: 97%    General- elderly female in no acute distress Head- atraumatic, normocephalic Eyes- PERRLA, EOMI, no pallor, no icterus, no discharge Neck- no lymphadenopathy, no thyromegaly Throat- moist mucus membrane Cardiovascular- normal s1,s2, no murmurs Respiratory- bilateral clear to auscultation, no wheeze, no rhonchi, no crackles, no use of accessory muscles Abdomen- bowel sounds present, soft, non tender, no CVA tenderness Musculoskeletal- able to move all 4 extremities, no spinal and paraspinal tenderness, steady gait, no leg edema, weakness present Neurological- no focal deficit Skin- warm and dry, incision site on abdominal wall healing well with steristrip in place. Also has small open wound  area healing well on abdominal wall. Drain insertion area on right buttock healing well. Right arm picc line site clean Psychiatry- alert and oriented to person, place and time, normal mood and affect    Labs reviewed: Basic Metabolic Panel:  Recent Labs  03/21/14 0510 03/21/14 0735 03/22/14 0500  NA SPECIMEN CONTAMINATED, UNABLE TO PERFORM TEST(S). 136* 139  K SPECIMEN CONTAMINATED, UNABLE TO PERFORM TEST(S). 4.2 3.6*  CL SPECIMEN CONTAMINATED, UNABLE TO PERFORM TEST(S). 98 99  CO2 SPECIMEN CONTAMINATED, UNABLE TO PERFORM TEST(S). 24 24  GLUCOSE SPECIMEN CONTAMINATED, UNABLE TO PERFORM TEST(S). 119* 124*  BUN SPECIMEN CONTAMINATED, UNABLE TO PERFORM TEST(S). 13 17  CREATININE SPECIMEN CONTAMINATED, UNABLE TO PERFORM TEST(S). 1.36* 1.35*  CALCIUM SPECIMEN CONTAMINATED, UNABLE TO PERFORM TEST(S). 10.6* 10.3  MG SPECIMEN CONTAMINATED, UNABLE TO PERFORM TEST(S). 2.4 2.3  PHOS SPECIMEN CONTAMINATED, UNABLE TO  PERFORM TEST(S). 3.8 4.3   Liver Function Tests:  Recent Labs  03/10/14 1031 03/19/14 0346 03/22/14 0500  AST 36 35 26  ALT 39* 39* 32  ALKPHOS 125* 79 77  BILITOT 0.7 0.6 0.5  PROT 7.6 7.1 8.1  ALBUMIN 3.3* 3.1* 3.4*    Recent Labs  09/11/13 2240  LIPASE 24   No results found for this basename: AMMONIA,  in the last 8760 hours CBC:  Recent Labs  11/08/13 1609 02/21/14 1110  03/15/14 0550 03/17/14 0450 03/19/14 0346  WBC 8.1 8.0  < > 8.3 7.2 7.2  NEUTROABS 4.3 4.9  --   --   --  3.9  HGB 11.1* 13.0  < > 8.3* 8.3* 7.7*  HCT 33.9* 38.4  < > 24.6* 25.2* 23.0*  MCV 89.2 86.7  < > 87.2 87.5 86.8  PLT 244.0 244  < > 282 305 320  < > = values in this interval not displayed. Cardiac Enzymes:  Recent Labs  11/08/13 1609  CKTOTAL 23  CKMB 0.8  TROPONINI <0.01   BNP: No components found with this basename: POCBNP,  CBG:  Recent Labs  03/21/14 2307 03/22/14 0632 03/22/14 1138  GLUCAP 128* 118* 121*    Assessment/Plan  Generalized weakness Will  have her work with physical therapy and occupational therapy team to help with gait training and muscle strengthening exercises.fall precautions. Skin care. Encourage to be out of bed.   Dizziness Her dconditoning and bp medication could both be causing this. Will d/c her HCTZ with bp being on lower side of normal and will change her prn meclizine to 12.5 mg bid for now. Also to check her orthostatics given the symptom and low hemoglobin on discharge  Pelvic abscess S/p drainage and to complete her unasyn until 03/29/14. Has follow up with surgery. picc line care. Continue TPN. Monitor lytes  Anemia Likely post blood loss. Recheck h&h  HTN Hold HCTZ for now with c/o dizziness. check bp bid and consider starting bp medication if bp > 140/90  Hypothyroidism Continue levothyroxine 125 mcg daily  Insomnia Continue prn ambien for her sleep  Anxiety Somewhat anxious, continue lorazepam q8h prn for now  Low back pain'continue oxycodone 10-15 mg q4h prn for pain, monitor bowel regimen  Family/ staff Communication: reviewed care plan with patient and nursing supervisor  Goals of care: short term rehabilitation   Labs/tests ordered: cbc with diff, cmp next lab draw    Blanchie Serve, MD  [facility] [phone] (Monday-Friday 8 am - 5 pm) [phone] (afterhours)

## 2014-03-26 ENCOUNTER — Other Ambulatory Visit: Payer: Self-pay | Admitting: *Deleted

## 2014-03-26 MED ORDER — LORAZEPAM 0.5 MG PO TABS: ORAL_TABLET | ORAL | Status: DC

## 2014-03-26 NOTE — Telephone Encounter (Signed)
[facility] 

## 2014-04-03 ENCOUNTER — Encounter (INDEPENDENT_AMBULATORY_CARE_PROVIDER_SITE_OTHER): Payer: Self-pay | Admitting: General Surgery

## 2014-04-03 ENCOUNTER — Ambulatory Visit (INDEPENDENT_AMBULATORY_CARE_PROVIDER_SITE_OTHER): Payer: Medicare Other | Admitting: General Surgery

## 2014-04-03 ENCOUNTER — Other Ambulatory Visit (INDEPENDENT_AMBULATORY_CARE_PROVIDER_SITE_OTHER): Payer: Self-pay | Admitting: General Surgery

## 2014-04-03 VITALS — BP 125/79 | HR 73 | Temp 98.6°F | Resp 19 | Ht 65.5 in | Wt 177.4 lb

## 2014-04-03 DIAGNOSIS — K9189 Other postprocedural complications and disorders of digestive system: Secondary | ICD-10-CM

## 2014-04-03 DIAGNOSIS — IMO0002 Reserved for concepts with insufficient information to code with codable children: Secondary | ICD-10-CM

## 2014-04-03 DIAGNOSIS — B3781 Candidal esophagitis: Secondary | ICD-10-CM

## 2014-04-03 DIAGNOSIS — K435 Parastomal hernia without obstruction or  gangrene: Secondary | ICD-10-CM

## 2014-04-03 DIAGNOSIS — B370 Candidal stomatitis: Secondary | ICD-10-CM | POA: Insufficient documentation

## 2014-04-03 DIAGNOSIS — K929 Disease of digestive system, unspecified: Secondary | ICD-10-CM

## 2014-04-03 MED ORDER — FLUCONAZOLE 100 MG PO TABS: 100.0000 mg | ORAL_TABLET | Freq: Every day | ORAL | Status: AC

## 2014-04-03 NOTE — Patient Instructions (Signed)
Home with TNA  Contrast enema next week to eval for leak.  Follow up with me in 2-3 weeks.

## 2014-04-04 ENCOUNTER — Telehealth (INDEPENDENT_AMBULATORY_CARE_PROVIDER_SITE_OTHER): Payer: Self-pay | Admitting: *Deleted

## 2014-04-04 NOTE — Telephone Encounter (Signed)
LMOM regarding her test Dr. Barry Dienes ordered.  Advise pt it has been scheduled for 04-11-14 arriving at 8:45 a.m.  If pt hasn't already received the bowel prep, advise pt to go to [facility] to pick it up.  It is scheduled for [place], [place].  Anderson Malta

## 2014-04-05 NOTE — Progress Notes (Signed)
HISTORY: Pt is approximately 5 weeks s/p colostomy takedown and parastomal hernia repair following Hartmann's procedure 1/28 for refractory diverticulitis.  She was readmitted for pelvic abscess which was shown to be small leak.  She was made NPO and placed on TNA. Pelvic drain had to be removed due to irritation of sciatic nerve.  Imaging in the hospital prior to d/c demonstrated improvement in cavity size.  She remains afebrile.  She is not having rectal bleeding, pelvic pain, or changes in bowel habits.  She is continuing to get TNA in the SNF.  She is on her feet again and ready to go home with her daughter.     EXAM: General:  Alert and oriented.   Incision:  Healing well.     PATHOLOGY: Diagnosis Colon, colostomy stoma, with anastomotic rings - FINDINGS CONSISTENT WITH OSTOMY. - COLONIC MUCOSA WITH REACTIVE CHANGES. - NO DYSPLASIA OR MALIGNANCY   ASSESSMENT AND PLAN:   Parastomal hernia s/p colostomy takedown & repair 02/27/2014 Small anastamotic leak. Asymptomatic Gastrograffin enema next week to evaluate leak.  If no longer present, can start clear liquids PO.  Sent message that OK with me for her to go home with TNA      Milus Height, MD Surgical Oncology, [facility]  [name], MD Janith Lima, MD

## 2014-04-05 NOTE — Assessment & Plan Note (Signed)
Small anastamotic leak. Asymptomatic Gastrograffin enema next week to evaluate leak.  If no longer present, can start clear liquids PO.  Sent message that OK with me for her to go home with TNA

## 2014-04-06 ENCOUNTER — Non-Acute Institutional Stay (SKILLED_NURSING_FACILITY): Payer: Medicare Other | Admitting: Internal Medicine

## 2014-04-06 ENCOUNTER — Encounter: Payer: Self-pay | Admitting: Internal Medicine

## 2014-04-06 DIAGNOSIS — J438 Other emphysema: Secondary | ICD-10-CM

## 2014-04-06 DIAGNOSIS — G5700 Lesion of sciatic nerve, unspecified lower limb: Secondary | ICD-10-CM | POA: Diagnosis not present

## 2014-04-06 DIAGNOSIS — B3731 Acute candidiasis of vulva and vagina: Secondary | ICD-10-CM | POA: Insufficient documentation

## 2014-04-06 DIAGNOSIS — N731 Chronic parametritis and pelvic cellulitis: Secondary | ICD-10-CM

## 2014-04-06 DIAGNOSIS — N739 Female pelvic inflammatory disease, unspecified: Secondary | ICD-10-CM

## 2014-04-06 DIAGNOSIS — B373 Candidiasis of vulva and vagina: Secondary | ICD-10-CM

## 2014-04-06 DIAGNOSIS — E039 Hypothyroidism, unspecified: Secondary | ICD-10-CM | POA: Diagnosis not present

## 2014-04-06 DIAGNOSIS — Z452 Encounter for adjustment and management of vascular access device: Secondary | ICD-10-CM | POA: Diagnosis not present

## 2014-04-06 DIAGNOSIS — F418 Other specified anxiety disorders: Secondary | ICD-10-CM | POA: Insufficient documentation

## 2014-04-06 DIAGNOSIS — B370 Candidal stomatitis: Secondary | ICD-10-CM

## 2014-04-06 DIAGNOSIS — N182 Chronic kidney disease, stage 2 (mild): Secondary | ICD-10-CM | POA: Diagnosis not present

## 2014-04-06 DIAGNOSIS — K219 Gastro-esophageal reflux disease without esophagitis: Secondary | ICD-10-CM

## 2014-04-06 DIAGNOSIS — R42 Dizziness and giddiness: Secondary | ICD-10-CM

## 2014-04-06 DIAGNOSIS — B3781 Candidal esophagitis: Secondary | ICD-10-CM | POA: Diagnosis not present

## 2014-04-06 DIAGNOSIS — K929 Disease of digestive system, unspecified: Secondary | ICD-10-CM | POA: Diagnosis not present

## 2014-04-06 DIAGNOSIS — J439 Emphysema, unspecified: Secondary | ICD-10-CM

## 2014-04-06 DIAGNOSIS — F411 Generalized anxiety disorder: Secondary | ICD-10-CM

## 2014-04-06 DIAGNOSIS — D649 Anemia, unspecified: Secondary | ICD-10-CM | POA: Diagnosis not present

## 2014-04-06 NOTE — Progress Notes (Signed)
Patient ID: Valerie Jackson, female   DOB: December 30, 1945, 68 y.o.   MRN: [id number]    Facility: [facility]  Chief Complaint  Patient presents with  . Discharge Note    discharge visit  . Allergies  Allergen Reactions  . Estrogens     Blood clot in leg   HPI 68 y/o female patient  is seen today for discharge visit. She is here for rehabilitation after admission for pelvic abscess drainage and is now s/p tube feed. She has made improvement with therapy and getting discharged today. Pt seen in hallway. She is in no distress, moving around without any assistive device and has no complaints. Her dizziness is under control with scheduled meclizine and her HCTZ continues to be on hold. She has completed her antibiotics  Review of Systems  Constitutional: Negative for fever, chills, malaise/fatigue and diaphoresis.  HENT: Negative for congestion, hearing loss and sore throat.   Eyes: Negative for blurred vision, double vision and discharge.  Respiratory: Negative for cough, sputum production, shortness of breath and wheezing.   Cardiovascular: Negative for chest pain, palpitations, orthopnea and leg swelling.  Gastrointestinal: Negative for heartburn, nausea, vomiting, abdominal pain Genitourinary: Negative for dysuria, urgency, frequency and flank pain. Has vaginal itching.  Musculoskeletal: Negative for back pain, falls, joint pain and myalgias.  Skin: Negative for itching and rash.  Neurological: Negative for dizziness, tingling, focal weakness and headaches.  Psychiatric/Behavioral: Negative for depression and memory loss. The patient is not nervous/anxious.    Past Medical History  Diagnosis Date  . Hypertension   . Hypothyroid   . Depression   . Hypercholesteremia   . Sickle cell trait   . GERD (gastroesophageal reflux disease)   . History of blood transfusion 1965  . Diverticulitis 09/2013    with perforation  . Pericolonic abscess 2015  . Obesity   .  Hypokalemia   . H/O hiatal hernia   . Arthritis     fingers  . Headache(784.0)     migraine every 5- to 6 years  . Blood clot in vein 1968  . Hepatitis age 46 or 18    had heapatitis, not sure which kind  . Heart murmur since age 57    mild  . Diverticulitis of colon with perforation 09/28/2013   Current Outpatient Prescriptions on File Prior to Visit  Medication Sig Dispense Refill  . cholecalciferol (VITAMIN D) 1000 UNITS tablet Take 1 tablet (1,000 Units total) by mouth daily.  100 tablet  3  . fluconazole (DIFLUCAN) 100 MG tablet Take 1 tablet (100 mg total) by mouth daily.  7 tablet  0  . levothyroxine (SYNTHROID, LEVOTHROID) 125 MCG tablet Take 1 tablet (125 mcg total) by mouth daily before breakfast.  90 tablet  3  . LORazepam (ATIVAN) 0.5 MG tablet Take one tablet by mouth every 8 hours as needed for anxiety  90 tablet  5  . meclizine (ANTIVERT) 12.5 MG tablet Take 12.5 mg by mouth 2 (two) times daily.       [name] nystatin (MYCOSTATIN) 100000 UNIT/ML suspension Take 5 mLs (500,000 Units total) by mouth 4 (four) times daily.  120 mL  0  . Oxycodone HCl 10 MG TABS Take 1-1.5 tablets (10-15 mg total) by mouth every 4 (four) hours as needed.  40 tablet  0  . Tiotropium Bromide Monohydrate (SPIRIVA RESPIMAT) 2.5 MCG/ACT AERS Inhale 2 puffs into the lungs daily.  4 g  11  . zolpidem (AMBIEN) 5 MG  tablet Take 1 tablet (5 mg total) by mouth at bedtime as needed for sleep.  30 tablet  0   No current facility-administered medications on file prior to visit.   Physical exam BP 108/72  Pulse 80  Temp(Src) 97.8 F (36.6 C)  Resp 17  Ht 5' 5"  (1.651 m)  Wt 177 lb (80.287 kg)  BMI 29.45 kg/m2  General- elderly female in no acute distress Head- atraumatic, normocephalic Eyes- no pallor, no icterus, no discharge Neck- no lymphadenopathy Cardiovascular- normal s1,s2, no murmurs Respiratory- bilateral clear to auscultation, no wheeze, no rhonchi, no crackles Abdomen- bowel sounds present,  soft, non tender Musculoskeletal- able to move all 4 extremities,  no leg edema Neurological- no focal deficit Skin- warm and dry, picc line in place and site clean Psychiatry- alert and oriented to person, place and time, normal mood and affect  Assessment/plan  She is stable to be discharged home with home health services for RN to help with teaching on self administering TPN, PICC line care, adjusting TPN and checking labs per protocol. She will also need a tub bench.  30 days script on medications have been provided Patient to follow with PCP outpatient in 1-2 weeks post discharge, appointment to be scheduled Has follow up with her surgeon  Her HCTZ has been on hold. Would have PCP resume it if her bp starts rising  On meclizine bid for now, with improvement of her dizziness on outpatient follow up, it can be changed to prn over time  On fluconazole until 04/13/14 only  Discharge care plan reviewed with patient, nursing supervisor and home health care agency

## 2014-04-10 DIAGNOSIS — Z452 Encounter for adjustment and management of vascular access device: Secondary | ICD-10-CM | POA: Diagnosis not present

## 2014-04-10 DIAGNOSIS — N182 Chronic kidney disease, stage 2 (mild): Secondary | ICD-10-CM | POA: Diagnosis not present

## 2014-04-10 DIAGNOSIS — Z79899 Other long term (current) drug therapy: Secondary | ICD-10-CM | POA: Diagnosis not present

## 2014-04-10 DIAGNOSIS — D649 Anemia, unspecified: Secondary | ICD-10-CM | POA: Diagnosis not present

## 2014-04-10 DIAGNOSIS — K929 Disease of digestive system, unspecified: Secondary | ICD-10-CM | POA: Diagnosis not present

## 2014-04-10 DIAGNOSIS — G5700 Lesion of sciatic nerve, unspecified lower limb: Secondary | ICD-10-CM | POA: Diagnosis not present

## 2014-04-11 ENCOUNTER — Ambulatory Visit
Admission: RE | Admit: 2014-04-11 | Discharge: 2014-04-11 | Disposition: A | Discharge status: Home / Self Care | Payer: Medicare Other | Source: Ambulatory Visit | Attending: General Surgery | Admitting: General Surgery

## 2014-04-11 ENCOUNTER — Telehealth: Payer: Self-pay | Admitting: *Deleted

## 2014-04-11 ENCOUNTER — Telehealth (INDEPENDENT_AMBULATORY_CARE_PROVIDER_SITE_OTHER): Payer: Self-pay | Admitting: *Deleted

## 2014-04-11 DIAGNOSIS — K6389 Other specified diseases of intestine: Secondary | ICD-10-CM | POA: Diagnosis not present

## 2014-04-11 DIAGNOSIS — K9189 Other postprocedural complications and disorders of digestive system: Secondary | ICD-10-CM

## 2014-04-11 NOTE — Telephone Encounter (Signed)
Pt called wanting to know if she could have solid food since she had that DG Colon test done this morning. She has been TPN since 03-16-14.  I advised pt that in Dr. Tyrone Sage office visit, that it stated that if she didn't have a leak, that she could go on liquids, but I wanted to verify this from Dr. Barry Dienes first and I would let her know.  She stated that she would appreciate it b/c she is really hungry.  Pt verbalized understanding.  Anderson Malta

## 2014-04-11 NOTE — Telephone Encounter (Signed)
Tell her no steak yet, but liquids OK.

## 2014-04-11 NOTE — Telephone Encounter (Signed)
Received labs from [facility] for patient and patient has been discharged from facility. I called [facility] and spoke with Lattie Haw and told her to send labs to Primary Provider. She will send to Dr. [name].

## 2014-04-12 NOTE — Telephone Encounter (Signed)
LMOM for pt to return the call regarding the message below.  If pt calls, please advise her that Dr. Barry Dienes advised that she only have liquids for now.   Thanks!  Anderson Malta

## 2014-04-12 NOTE — Telephone Encounter (Signed)
Pt advised and understands.

## 2014-04-12 NOTE — Telephone Encounter (Signed)
error 

## 2014-04-16 DIAGNOSIS — N182 Chronic kidney disease, stage 2 (mild): Secondary | ICD-10-CM | POA: Diagnosis not present

## 2014-04-16 DIAGNOSIS — D649 Anemia, unspecified: Secondary | ICD-10-CM | POA: Diagnosis not present

## 2014-04-16 DIAGNOSIS — G5700 Lesion of sciatic nerve, unspecified lower limb: Secondary | ICD-10-CM | POA: Diagnosis not present

## 2014-04-16 DIAGNOSIS — K929 Disease of digestive system, unspecified: Secondary | ICD-10-CM | POA: Diagnosis not present

## 2014-04-16 DIAGNOSIS — Z452 Encounter for adjustment and management of vascular access device: Secondary | ICD-10-CM | POA: Diagnosis not present

## 2014-04-17 LAB — CBC WITH DIFFERENTIAL/PLATELET
BASO%: 1 %
Basophils Absolute: 0 /uL
EOS%: 5 %
Eosinophils Absolute: 0 /uL
Granulocyte count absolute: 5.3
Granulocyte percent: 62 %G (ref 37–80)
HCT: 32 %
HGB: 10.7 g/dL
LYMPH%: 26 %
Lymphocytes Absolute: 2 /uL
MCHC: 33.5
MCV: 86 fL
MONO%: 6 %
Monocytes Absolute: 1 /uL
RBC: 3.71
RDW: 14.2
WBC: 8.5
platelet count: 225

## 2014-04-17 LAB — COMPREHENSIVE METABOLIC PANEL
Albumin: 3.9
Magnesium: 1.8 mg/dL (ref 1.6–2.4)
Phosphorus: 3.1 mg/dL (ref 2.5–4.9)
Total Protein: 7.2 g/dL

## 2014-04-23 ENCOUNTER — Encounter (INDEPENDENT_AMBULATORY_CARE_PROVIDER_SITE_OTHER): Payer: Self-pay | Admitting: General Surgery

## 2014-04-23 ENCOUNTER — Ambulatory Visit (INDEPENDENT_AMBULATORY_CARE_PROVIDER_SITE_OTHER): Payer: Medicare Other | Admitting: General Surgery

## 2014-04-23 ENCOUNTER — Telehealth (INDEPENDENT_AMBULATORY_CARE_PROVIDER_SITE_OTHER): Payer: Self-pay

## 2014-04-23 VITALS — BP 118/84 | HR 100 | Temp 97.4°F | Resp 18 | Ht 66.0 in | Wt 189.0 lb

## 2014-04-23 DIAGNOSIS — K435 Parastomal hernia without obstruction or  gangrene: Secondary | ICD-10-CM

## 2014-04-23 DIAGNOSIS — IMO0002 Reserved for concepts with insufficient information to code with codable children: Secondary | ICD-10-CM

## 2014-04-23 NOTE — Progress Notes (Signed)
HISTORY: Pt is approximately 7 weeks s/p colostomy takedown and parastomal hernia repair following Hartmann's procedure 1/28 for refractory diverticulitis.  She was readmitted for pelvic abscess which was shown to be small leak.  She was made NPO and placed on TNA. Pelvic drain had to be removed due to irritation of sciatic nerve. Pt is s/p repeat enema, and she is doing well.  She denies fever/chills.  She has been advanced to full liquids.  She is still having some pain at the parastomal hernia site, but not much at midline.      EXAM: General:  Alert and oriented.   Incision:  Healing well.     PATHOLOGY: Diagnosis Colon, colostomy stoma, with anastomotic rings - FINDINGS CONSISTENT WITH OSTOMY. - COLONIC MUCOSA WITH REACTIVE CHANGES. - NO DYSPLASIA OR MALIGNANCY   ASSESSMENT AND PLAN:   Parastomal hernia s/p colostomy takedown & repair 02/27/2014 Advancing diet. Liberalizing activity. Follow up in 6 weeks.         Milus Height, MD Surgical Oncology, [facility]  [name], MD No ref. provider found

## 2014-04-23 NOTE — Patient Instructions (Signed)
OK to eat. We will make arrangements for Home health to pull picc line.  Ok to drive to [place] if you are able to walk around your house 3 times and click your heels and say "There's no place like [place]."

## 2014-04-23 NOTE — Assessment & Plan Note (Signed)
Advancing diet. Liberalizing activity. Follow up in 6 weeks.

## 2014-04-23 NOTE — Telephone Encounter (Signed)
Per Dr Marlowe Aschoff order Maudie Mercury at [facility] nurse intake given order to remove picc line from right arm. HHN scheduled to go to pts home tomorrow.

## 2014-04-24 DIAGNOSIS — N182 Chronic kidney disease, stage 2 (mild): Secondary | ICD-10-CM | POA: Diagnosis not present

## 2014-04-24 DIAGNOSIS — D649 Anemia, unspecified: Secondary | ICD-10-CM | POA: Diagnosis not present

## 2014-04-24 DIAGNOSIS — G5700 Lesion of sciatic nerve, unspecified lower limb: Secondary | ICD-10-CM | POA: Diagnosis not present

## 2014-04-24 DIAGNOSIS — K929 Disease of digestive system, unspecified: Secondary | ICD-10-CM | POA: Diagnosis not present

## 2014-04-24 DIAGNOSIS — Z452 Encounter for adjustment and management of vascular access device: Secondary | ICD-10-CM | POA: Diagnosis not present

## 2014-05-08 ENCOUNTER — Other Ambulatory Visit (INDEPENDENT_AMBULATORY_CARE_PROVIDER_SITE_OTHER): Payer: Self-pay | Admitting: General Surgery

## 2014-05-14 ENCOUNTER — Encounter: Payer: Self-pay | Admitting: Internal Medicine

## 2014-05-14 ENCOUNTER — Other Ambulatory Visit (INDEPENDENT_AMBULATORY_CARE_PROVIDER_SITE_OTHER): Payer: Medicare Other

## 2014-05-14 ENCOUNTER — Ambulatory Visit (INDEPENDENT_AMBULATORY_CARE_PROVIDER_SITE_OTHER): Payer: Medicare Other | Admitting: Internal Medicine

## 2014-05-14 VITALS — BP 138/88 | HR 94 | Temp 98.0°F | Resp 20 | Ht 66.0 in | Wt 190.2 lb

## 2014-05-14 DIAGNOSIS — R7309 Other abnormal glucose: Secondary | ICD-10-CM | POA: Diagnosis not present

## 2014-05-14 DIAGNOSIS — N182 Chronic kidney disease, stage 2 (mild): Secondary | ICD-10-CM

## 2014-05-14 DIAGNOSIS — I10 Essential (primary) hypertension: Secondary | ICD-10-CM

## 2014-05-14 DIAGNOSIS — R740 Nonspecific elevation of levels of transaminase and lactic acid dehydrogenase [LDH]: Secondary | ICD-10-CM

## 2014-05-14 DIAGNOSIS — E039 Hypothyroidism, unspecified: Secondary | ICD-10-CM

## 2014-05-14 DIAGNOSIS — R7401 Elevation of levels of liver transaminase levels: Secondary | ICD-10-CM

## 2014-05-14 DIAGNOSIS — R7402 Elevation of levels of lactic acid dehydrogenase (LDH): Secondary | ICD-10-CM | POA: Diagnosis not present

## 2014-05-14 DIAGNOSIS — D538 Other specified nutritional anemias: Secondary | ICD-10-CM

## 2014-05-14 LAB — COMPREHENSIVE METABOLIC PANEL
ALT: 36 U/L — ABNORMAL HIGH (ref 0–35)
AST: 33 U/L (ref 0–37)
Albumin: 3.7 g/dL (ref 3.5–5.2)
Alkaline Phosphatase: 71 U/L (ref 39–117)
BUN: 12 mg/dL (ref 6–23)
CO2: 27 mEq/L (ref 19–32)
Calcium: 9.9 mg/dL (ref 8.4–10.5)
Chloride: 107 mEq/L (ref 96–112)
Creatinine, Ser: 1.5 mg/dL — ABNORMAL HIGH (ref 0.4–1.2)
GFR: 45.3 mL/min — ABNORMAL LOW (ref >60.00)
Glucose, Bld: 96 mg/dL (ref 70–99)
Potassium: 3.4 mEq/L — ABNORMAL LOW (ref 3.5–5.1)
Sodium: 142 mEq/L (ref 135–145)
Total Bilirubin: 1 mg/dL (ref 0.2–1.2)
Total Protein: 7.8 g/dL (ref 6.0–8.3)

## 2014-05-14 LAB — CBC WITH DIFFERENTIAL/PLATELET
Basophils Absolute: 0 10*3/uL (ref 0.0–0.1)
Basophils Relative: 0.6 % (ref 0.0–3.0)
Eosinophils Absolute: 0.3 10*3/uL (ref 0.0–0.7)
Eosinophils Relative: 3.7 % (ref 0.0–5.0)
HCT: 34.4 % — ABNORMAL LOW (ref 36.0–46.0)
Hemoglobin: 11.4 g/dL — ABNORMAL LOW (ref 12.0–15.0)
Lymphocytes Relative: 28.8 % (ref 12.0–46.0)
Lymphs Abs: 2.2 10*3/uL (ref 0.7–4.0)
MCHC: 33.2 g/dL (ref 30.0–36.0)
MCV: 86.9 fl (ref 78.0–100.0)
Monocytes Absolute: 0.4 10*3/uL (ref 0.1–1.0)
Monocytes Relative: 6 % (ref 3.0–12.0)
Neutro Abs: 4.6 10*3/uL (ref 1.4–7.7)
Neutrophils Relative %: 60.9 % (ref 43.0–77.0)
Platelets: 220 10*3/uL (ref 150.0–400.0)
RBC: 3.96 Mil/uL (ref 3.87–5.11)
RDW: 14.7 % (ref 11.5–15.5)
WBC: 7.5 10*3/uL (ref 4.0–10.5)

## 2014-05-14 LAB — HEMOGLOBIN A1C: Hgb A1c MFr Bld: 4.7 % (ref 4.6–6.5)

## 2014-05-14 LAB — FERRITIN: Ferritin: 77.7 ng/mL (ref 10.0–291.0)

## 2014-05-14 LAB — IBC PANEL
Iron: 87 ug/dL (ref 42–145)
Saturation Ratios: 27.4 % (ref 20.0–50.0)
Transferrin: 226.6 mg/dL (ref 212.0–360.0)

## 2014-05-14 LAB — TSH: TSH: 0.45 u[IU]/mL (ref 0.35–4.50)

## 2014-05-14 NOTE — Progress Notes (Signed)
Subjective:    Patient ID: Valerie Jackson, female    DOB: [birth year], 68 y.o.   MRN: [id number]  Anemia Presents for follow-up visit. There has been no abdominal pain, anorexia, bruising/bleeding easily, confusion, fever, leg swelling, light-headedness, malaise/fatigue, pallor, palpitations, paresthesias, pica or weight loss. Signs of blood loss that are not present include hematemesis, hematochezia, melena and vaginal bleeding. Past medical history includes hypothyroidism, recent illness and recent surgery. There is no history of alcohol abuse or recent trauma. There are no compliance problems.       Review of Systems  Constitutional: Negative.  Negative for fever, chills, weight loss, malaise/fatigue, diaphoresis, appetite change and fatigue.  HENT: Negative.   Eyes: Negative.   Respiratory: Negative.  Negative for apnea, cough, choking, chest tightness, shortness of breath, wheezing and stridor.   Cardiovascular: Negative.  Negative for chest pain, palpitations and leg swelling.  Gastrointestinal: Negative.  Negative for nausea, vomiting, abdominal pain, diarrhea, constipation, blood in stool, melena, hematochezia, abdominal distention, anal bleeding, rectal pain, anorexia and hematemesis.  Endocrine: Negative.   Genitourinary: Negative.  Negative for vaginal bleeding.  Musculoskeletal: Negative.  Negative for arthralgias, back pain, myalgias and neck pain.  Skin: Negative.  Negative for pallor.  Allergic/Immunologic: Negative.   Neurological: Negative.  Negative for dizziness, tremors, seizures, syncope, facial asymmetry, speech difficulty, weakness, light-headedness, numbness, headaches and paresthesias.  Hematological: Negative.  Negative for adenopathy. Does not bruise/bleed easily.  Psychiatric/Behavioral: Positive for sleep disturbance. Negative for suicidal ideas, hallucinations, behavioral problems, confusion, self-injury, dysphoric mood, decreased concentration and agitation.  The patient is not nervous/anxious and is not hyperactive.        Objective:   Physical Exam  Vitals reviewed. Constitutional: She is oriented to person, place, and time. She appears well-developed and well-nourished. No distress.  HENT:  Head: Normocephalic and atraumatic.  Mouth/Throat: Oropharynx is clear and moist. No oropharyngeal exudate.  Eyes: Conjunctivae are normal. Right eye exhibits no discharge. Left eye exhibits no discharge. No scleral icterus.  Neck: Normal range of motion. Neck supple. No JVD present. No tracheal deviation present. No thyromegaly present.  Cardiovascular: Normal rate, regular rhythm, normal heart sounds and intact distal pulses.  Exam reveals no gallop and no friction rub.   No murmur heard. Pulmonary/Chest: Effort normal and breath sounds normal. No stridor. No respiratory distress. She has no wheezes. She has no rales. She exhibits no tenderness.  Abdominal: Soft. Bowel sounds are normal. She exhibits no distension and no mass. There is no tenderness. There is no rebound and no guarding.  Musculoskeletal: Normal range of motion. She exhibits no edema and no tenderness.  Lymphadenopathy:    She has no cervical adenopathy.  Neurological: She is oriented to person, place, and time.  Skin: Skin is warm and dry. No rash noted. She is not diaphoretic. No erythema. No pallor.  Psychiatric: She has a normal mood and affect. Her behavior is normal. Judgment and thought content normal.     Lab Results  Component Value Date   WBC 7.2 03/19/2014   HGB 7.7* 03/19/2014   HCT 23.0* 03/19/2014   PLT 320 03/19/2014   GLUCOSE 124* 03/22/2014   CHOL 130 07/19/2013   TRIG 245* 03/20/2014   HDL 53.20 07/19/2013   LDLDIRECT 226.0 02/27/2013   LDLCALC 50 07/19/2013   ALT 32 03/22/2014   AST 26 03/22/2014   NA 139 03/22/2014   K 3.6* 03/22/2014   CL 99 03/22/2014   CREATININE 1.35* 03/22/2014   BUN 17  03/22/2014   CO2 24 03/22/2014   TSH 0.36 11/08/2013   INR 1.04 02/21/2014   HGBA1C 4.7  02/27/2013       Assessment & Plan:

## 2014-05-14 NOTE — Progress Notes (Signed)
Pre visit review using our clinic review tool, if applicable. No additional management support is needed unless otherwise documented below in the visit note. 

## 2014-05-14 NOTE — Assessment & Plan Note (Signed)
My order for B12 and folate levels was blocked Will recheck her CBC and iron levels

## 2014-05-14 NOTE — Patient Instructions (Signed)
Anemia, Nonspecific Anemia is a condition in which the concentration of red blood cells or hemoglobin in the blood is below normal. Hemoglobin is a substance in red blood cells that carries oxygen to the tissues of the body. Anemia results in not enough oxygen reaching these tissues.  CAUSES  Common causes of anemia include:   Excessive bleeding. Bleeding may be internal or external. This includes excessive bleeding from periods (in women) or from the intestine.   Poor nutrition.   Chronic kidney, thyroid, and liver disease.  Bone marrow disorders that decrease red blood cell production.  Cancer and treatments for cancer.  HIV, AIDS, and their treatments.  Spleen problems that increase red blood cell destruction.  Blood disorders.  Excess destruction of red blood cells due to infection, medicines, and autoimmune disorders. SIGNS AND SYMPTOMS   Minor weakness.   Dizziness.   Headache.  Palpitations.   Shortness of breath, especially with exercise.   Paleness.  Cold sensitivity.  Indigestion.  Nausea.  Difficulty sleeping.  Difficulty concentrating. Symptoms may occur suddenly or they may develop slowly.  DIAGNOSIS  Additional blood tests are often needed. These help your health care provider determine the best treatment. Your health care provider will check your stool for blood and look for other causes of blood loss.  TREATMENT  Treatment varies depending on the cause of the anemia. Treatment can include:   Supplements of iron, vitamin B12, or folic acid.   Hormone medicines.   A blood transfusion. This may be needed if blood loss is severe.   Hospitalization. This may be needed if there is significant continual blood loss.   Dietary changes.  Spleen removal. HOME CARE INSTRUCTIONS Keep all follow-up appointments. It often takes many weeks to correct anemia, and having your health care provider check on your condition and your response to  treatment is very important. SEEK IMMEDIATE MEDICAL CARE IF:   You develop extreme weakness, shortness of breath, or chest pain.   You become dizzy or have trouble concentrating.  You develop heavy vaginal bleeding.   You develop a rash.   You have bloody or black, tarry stools.   You faint.   You vomit up blood.   You vomit repeatedly.   You have abdominal pain.  You have a fever or persistent symptoms for more than 2-3 days.   You have a fever and your symptoms suddenly get worse.   You are dehydrated.  MAKE SURE YOU:  Understand these instructions.  Will watch your condition.  Will get help right away if you are not doing well or get worse. Document Released: 10/08/2004 Document Revised: 05/03/2013 Document Reviewed: 02/24/2013 ExitCare Patient Information 2015 [employer]. This information is not intended to replace advice given to you by your health care provider. Make sure you discuss any questions you have with your health care provider.  

## 2014-05-15 ENCOUNTER — Encounter: Payer: Self-pay | Admitting: Internal Medicine

## 2014-05-15 NOTE — Assessment & Plan Note (Signed)
Her BP is well controlled 

## 2014-05-15 NOTE — Assessment & Plan Note (Signed)
Her renal function is stable

## 2014-05-15 NOTE — Assessment & Plan Note (Signed)
Her TSH is on the normal range She will stay on the current dose

## 2014-05-22 ENCOUNTER — Encounter: Payer: Self-pay | Admitting: Internal Medicine

## 2014-05-22 LAB — COMPREHENSIVE METABOLIC PANEL
ALT: 57 U/L — AB (ref 7–35)
AST: 38 U/L
Alkaline Phosphatase: 99 U/L
BUN: 21 mg/dL (ref 4–21)
CO2: 22 mmol/L
Chloride: 109 mmol/L
Creat: 1.14
Glucose: 112
Potassium: 4.2 mmol/L
Sodium: 140 mmol/L (ref 137–147)
Total Bilirubin: 0.5 mg/dL

## 2014-06-02 ENCOUNTER — Other Ambulatory Visit: Payer: Self-pay | Admitting: Internal Medicine

## 2014-06-04 ENCOUNTER — Encounter (INDEPENDENT_AMBULATORY_CARE_PROVIDER_SITE_OTHER): Payer: Medicare Other | Admitting: General Surgery

## 2014-06-04 DIAGNOSIS — Z8719 Personal history of other diseases of the digestive system: Secondary | ICD-10-CM | POA: Diagnosis not present

## 2014-07-04 ENCOUNTER — Other Ambulatory Visit: Payer: Self-pay

## 2014-07-04 MED ORDER — LEVOTHYROXINE SODIUM 125 MCG PO TABS: 125.0000 ug | ORAL_TABLET | Freq: Every day | ORAL | Status: DC

## 2014-07-05 ENCOUNTER — Other Ambulatory Visit: Payer: Self-pay

## 2014-07-05 MED ORDER — LEVOTHYROXINE SODIUM 125 MCG PO TABS: 125.0000 ug | ORAL_TABLET | Freq: Every day | ORAL | Status: DC

## 2014-08-31 DIAGNOSIS — K432 Incisional hernia without obstruction or gangrene: Secondary | ICD-10-CM | POA: Diagnosis not present

## 2014-09-18 ENCOUNTER — Ambulatory Visit (INDEPENDENT_AMBULATORY_CARE_PROVIDER_SITE_OTHER): Payer: Medicare Other | Admitting: Internal Medicine

## 2014-09-18 ENCOUNTER — Encounter: Payer: Self-pay | Admitting: Internal Medicine

## 2014-09-18 ENCOUNTER — Other Ambulatory Visit (INDEPENDENT_AMBULATORY_CARE_PROVIDER_SITE_OTHER): Payer: Medicare Other

## 2014-09-18 VITALS — BP 130/84 | HR 99 | Temp 97.9°F | Resp 16 | Ht 66.0 in | Wt 197.0 lb

## 2014-09-18 DIAGNOSIS — F5105 Insomnia due to other mental disorder: Secondary | ICD-10-CM | POA: Diagnosis not present

## 2014-09-18 DIAGNOSIS — E785 Hyperlipidemia, unspecified: Secondary | ICD-10-CM | POA: Diagnosis not present

## 2014-09-18 DIAGNOSIS — F409 Phobic anxiety disorder, unspecified: Secondary | ICD-10-CM | POA: Insufficient documentation

## 2014-09-18 DIAGNOSIS — N182 Chronic kidney disease, stage 2 (mild): Secondary | ICD-10-CM

## 2014-09-18 DIAGNOSIS — E038 Other specified hypothyroidism: Secondary | ICD-10-CM

## 2014-09-18 DIAGNOSIS — I10 Essential (primary) hypertension: Secondary | ICD-10-CM

## 2014-09-18 LAB — CBC WITH DIFFERENTIAL/PLATELET
Basophils Absolute: 0.1 10*3/uL (ref 0.0–0.1)
Basophils Relative: 0.8 % (ref 0.0–3.0)
Eosinophils Absolute: 0.3 10*3/uL (ref 0.0–0.7)
Eosinophils Relative: 3.4 % (ref 0.0–5.0)
HCT: 37.5 % (ref 36.0–46.0)
Hemoglobin: 12.4 g/dL (ref 12.0–15.0)
Lymphocytes Relative: 23.9 % (ref 12.0–46.0)
Lymphs Abs: 1.9 10*3/uL (ref 0.7–4.0)
MCHC: 33.2 g/dL (ref 30.0–36.0)
MCV: 87.3 fl (ref 78.0–100.0)
Monocytes Absolute: 0.4 10*3/uL (ref 0.1–1.0)
Monocytes Relative: 4.9 % (ref 3.0–12.0)
Neutro Abs: 5.2 10*3/uL (ref 1.4–7.7)
Neutrophils Relative %: 67 % (ref 43.0–77.0)
Platelets: 207 10*3/uL (ref 150.0–400.0)
RBC: 4.29 Mil/uL (ref 3.87–5.11)
RDW: 14.8 % (ref 11.5–15.5)
WBC: 7.8 10*3/uL (ref 4.0–10.5)

## 2014-09-18 LAB — LIPID PANEL
Cholesterol: 247 mg/dL — ABNORMAL HIGH (ref 0–200)
HDL: 50.1 mg/dL (ref >39.00)
LDL Cholesterol: 167 mg/dL — ABNORMAL HIGH (ref 0–99)
NonHDL: 196.9
Total CHOL/HDL Ratio: 5
Triglycerides: 152 mg/dL — ABNORMAL HIGH (ref 0.0–149.0)
VLDL: 30.4 mg/dL (ref 0.0–40.0)

## 2014-09-18 LAB — BASIC METABOLIC PANEL
BUN: 13 mg/dL (ref 6–23)
CO2: 28 mEq/L (ref 19–32)
Calcium: 9.7 mg/dL (ref 8.4–10.5)
Chloride: 106 mEq/L (ref 96–112)
Creatinine, Ser: 1.3 mg/dL — ABNORMAL HIGH (ref 0.4–1.2)
GFR: 54.06 mL/min — ABNORMAL LOW (ref >60.00)
Glucose, Bld: 103 mg/dL — ABNORMAL HIGH (ref 70–99)
Potassium: 3.6 mEq/L (ref 3.5–5.1)
Sodium: 140 mEq/L (ref 135–145)

## 2014-09-18 LAB — TSH: TSH: 0.82 u[IU]/mL (ref 0.35–4.50)

## 2014-09-18 MED ORDER — ZOLPIDEM TARTRATE 5 MG PO TABS: 5.0000 mg | ORAL_TABLET | Freq: Every evening | ORAL | Status: DC | PRN

## 2014-09-18 NOTE — Assessment & Plan Note (Signed)
Her TSH is in the normal range She will stay on the current dose

## 2014-09-18 NOTE — Progress Notes (Signed)
   Subjective:    Patient ID: Valerie Jackson, female    DOB: 16-Jan-1946, 69 y.o.   MRN: [id number]  Thyroid Problem Presents for follow-up visit. Symptoms include anxiety, dry skin, fatigue and weight gain. Patient reports no cold intolerance, constipation, depressed mood, diaphoresis, diarrhea, hair loss, heat intolerance, hoarse voice, leg swelling, nail problem, palpitations, tremors, visual change or weight loss.      Review of Systems  Constitutional: Positive for weight gain and fatigue. Negative for fever, chills, weight loss, diaphoresis, activity change, appetite change and unexpected weight change.  HENT: Negative.  Negative for hoarse voice.   Eyes: Negative.   Respiratory: Negative.  Negative for cough, choking, chest tightness, shortness of breath and stridor.   Cardiovascular: Negative.  Negative for chest pain, palpitations and leg swelling.  Gastrointestinal: Negative.  Negative for nausea, vomiting, abdominal pain, diarrhea and constipation.  Endocrine: Negative.  Negative for cold intolerance and heat intolerance.  Genitourinary: Negative.   Musculoskeletal: Negative.   Skin: Negative.  Negative for rash.  Allergic/Immunologic: Negative.   Neurological: Negative.  Negative for dizziness and tremors.  Hematological: Negative.  Negative for adenopathy. Does not bruise/bleed easily.  Psychiatric/Behavioral: Positive for sleep disturbance. Negative for suicidal ideas, self-injury, dysphoric mood, decreased concentration and agitation. The patient is nervous/anxious.        Objective:   Physical Exam  Constitutional: She is oriented to person, place, and time. She appears well-developed and well-nourished. No distress.  HENT:  Head: Normocephalic and atraumatic.  Mouth/Throat: Oropharynx is clear and moist. No oropharyngeal exudate.  Eyes: Conjunctivae are normal. Right eye exhibits no discharge. Left eye exhibits no discharge. No scleral icterus.  Neck: Normal range  of motion. Neck supple. No JVD present. No tracheal deviation present. No thyromegaly present.  Cardiovascular: Normal rate, regular rhythm, normal heart sounds and intact distal pulses.  Exam reveals no gallop and no friction rub.   No murmur heard. Pulmonary/Chest: Effort normal and breath sounds normal. No stridor. No respiratory distress. She has no wheezes. She has no rales. She exhibits no tenderness.  Abdominal: Soft. Bowel sounds are normal. She exhibits no distension and no mass. There is no tenderness. There is no rebound and no guarding.  Musculoskeletal: Normal range of motion. She exhibits no edema or tenderness.  Lymphadenopathy:    She has no cervical adenopathy.  Neurological: She is oriented to person, place, and time.  Skin: Skin is warm and dry. No rash noted. She is not diaphoretic. No erythema. No pallor.  Vitals reviewed.    Lab Results  Component Value Date   WBC 7.5 05/14/2014   HGB 11.4* 05/14/2014   HCT 34.4* 05/14/2014   PLT 220.0 05/14/2014   GLUCOSE 96 05/14/2014   CHOL 130 07/19/2013   TRIG 245* 03/20/2014   HDL 53.20 07/19/2013   LDLDIRECT 226.0 02/27/2013   LDLCALC 50 07/19/2013   ALT 36* 05/14/2014   AST 33 05/14/2014   NA 142 05/14/2014   K 3.4* 05/14/2014   CL 107 05/14/2014   CREATININE 1.5* 05/14/2014   BUN 12 05/14/2014   CO2 27 05/14/2014   TSH 0.45 05/14/2014   INR 1.04 02/21/2014   HGBA1C 4.7 05/14/2014       Assessment & Plan:

## 2014-09-18 NOTE — Assessment & Plan Note (Signed)
Her renal function has improved

## 2014-09-18 NOTE — Assessment & Plan Note (Signed)
Her BP is well controlled Lytes and renal function are stable 

## 2014-09-18 NOTE — Patient Instructions (Signed)
Hypothyroidism The thyroid is a large gland located in the lower front of your neck. The thyroid gland helps control metabolism. Metabolism is how your body handles food. It controls metabolism with the hormone thyroxine. When this gland is underactive (hypothyroid), it produces too little hormone.  CAUSES These include:   Absence or destruction of thyroid tissue.  Goiter due to iodine deficiency.  Goiter due to medications.  Congenital defects (since birth).  Problems with the pituitary. This causes a lack of TSH (thyroid stimulating hormone). This hormone tells the thyroid to turn out more hormone. SYMPTOMS  Lethargy (feeling as though you have no energy)  Cold intolerance  Weight gain (in spite of normal food intake)  Dry skin  Coarse hair  Menstrual irregularity (if severe, may lead to infertility)  Slowing of thought processes Cardiac problems are also caused by insufficient amounts of thyroid hormone. Hypothyroidism in the newborn is cretinism, and is an extreme form. It is important that this form be treated adequately and immediately or it will lead rapidly to retarded physical and mental development. DIAGNOSIS  To prove hypothyroidism, your caregiver may do blood tests and ultrasound tests. Sometimes the signs are hidden. It may be necessary for your caregiver to watch this illness with blood tests either before or after diagnosis and treatment. TREATMENT  Low levels of thyroid hormone are increased by using synthetic thyroid hormone. This is a safe, effective treatment. It usually takes about four weeks to gain the full effects of the medication. After you have the full effect of the medication, it will generally take another four weeks for problems to leave. Your caregiver may start you on low doses. If you have had heart problems the dose may be gradually increased. It is generally not an emergency to get rapidly to normal. HOME CARE INSTRUCTIONS   Take your  medications as your caregiver suggests. Let your caregiver know of any medications you are taking or start taking. Your caregiver will help you with dosage schedules.  As your condition improves, your dosage needs may increase. It will be necessary to have continuing blood tests as suggested by your caregiver.  Report all suspected medication side effects to your caregiver. SEEK MEDICAL CARE IF: Seek medical care if you develop:  Sweating.  Tremulousness (tremors).  Anxiety.  Rapid weight loss.  Heat intolerance.  Emotional swings.  Diarrhea.  Weakness. SEEK IMMEDIATE MEDICAL CARE IF:  You develop chest pain, an irregular heart beat (palpitations), or a rapid heart beat. MAKE SURE YOU:   Understand these instructions.  Will watch your condition.  Will get help right away if you are not doing well or get worse. Document Released: 08/31/2005 Document Revised: 11/23/2011 Document Reviewed: 04/20/2008 ExitCare Patient Information 2015 [employer]. This information is not intended to replace advice given to you by your health care provider. Make sure you discuss any questions you have with your health care provider.  

## 2014-09-18 NOTE — Assessment & Plan Note (Signed)
She will cont [place] as needed

## 2014-09-18 NOTE — Assessment & Plan Note (Signed)
Her LDL is slightly elevated but she does not want to start a statin

## 2015-01-02 IMAGING — CT CT ABD-PELV W/ CM
1 of 3 series · 13 of 32 positions shown, 18 images · IV contrast (OMNIPAQUE 300)
Comparison: None.

CLINICAL DATA: Abdominal pain starting 2 days ago. Pain is in the
epigastric region along the entire abdomen with tenderness. Vomiting
2 days ago. White cell count 15.5.

EXAM:
CT ABDOMEN AND PELVIS WITH CONTRAST
TECHNIQUE: Multidetector CT imaging of the abdomen and pelvis was performed
using the standard protocol following bolus administration of
intravenous contrast.
CONTRAST:  50mL OMNIPAQUE IOHEXOL 300 MG/ML SOLN, 100mL OMNIPAQUE
IOHEXOL 300 MG/ML SOLN

[Series 2: abd/pel with · axial · 0.72mm/px · z∈[-490,-55]mm · 13 of 97 slices shown, 18 images]
[im 5/97  soft-tissue]
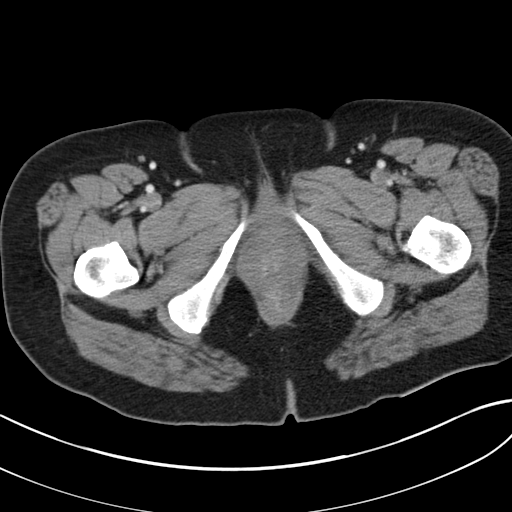
[im 5/97  bone]
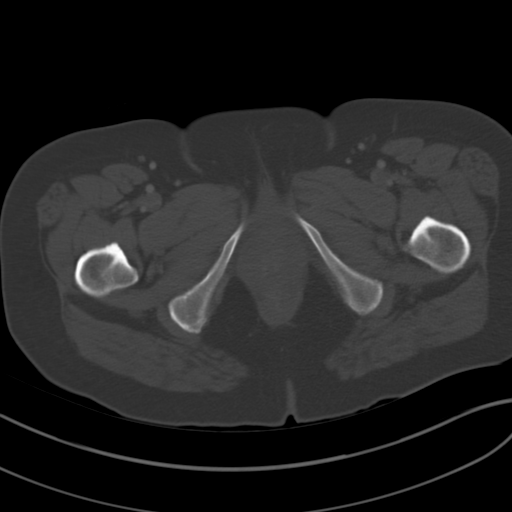
[im 15/97  soft-tissue]
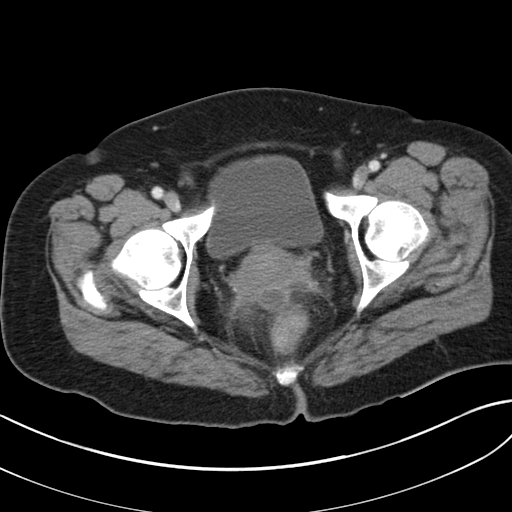
[im 20/97  soft-tissue]
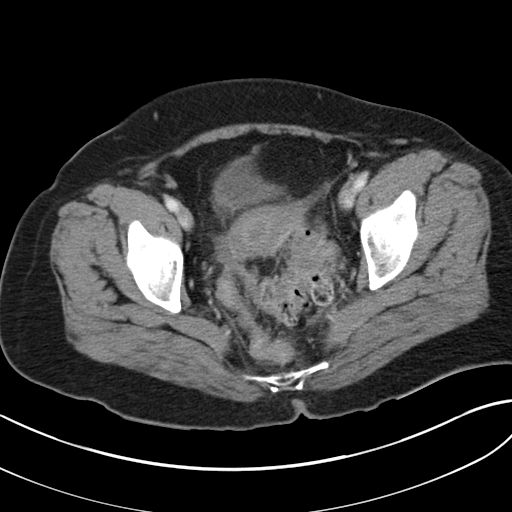
[im 29/97  soft-tissue]
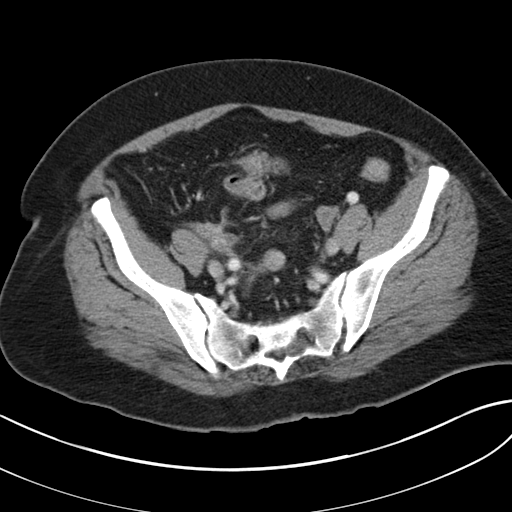
[im 39/97  soft-tissue]
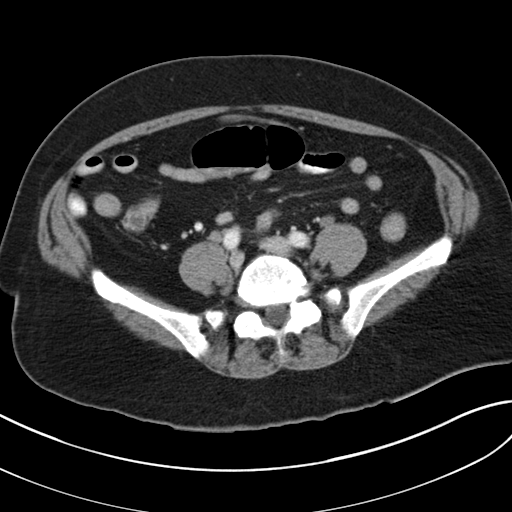
[im 44/97  soft-tissue]
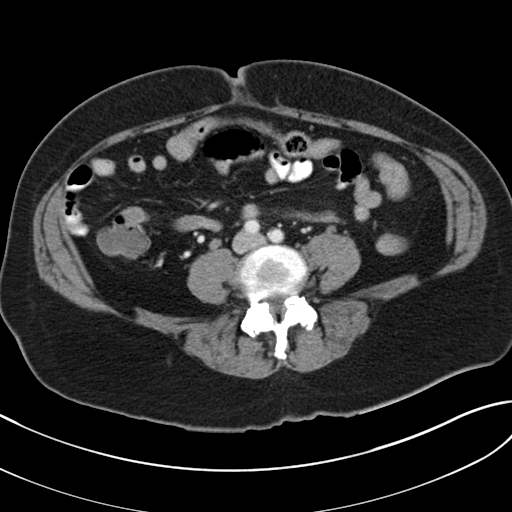
[im 53/97  soft-tissue]
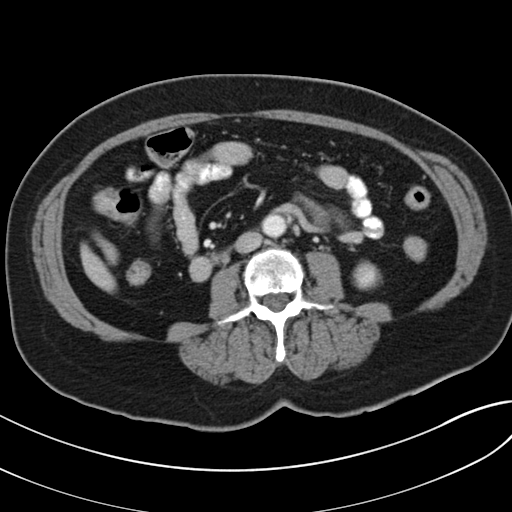
[im 58/97  soft-tissue]
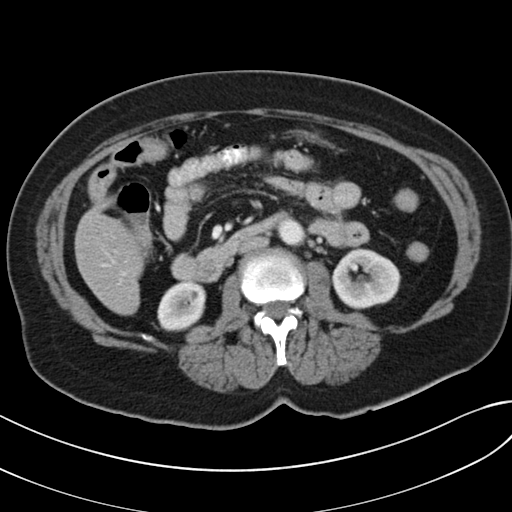
[im 68/97  soft-tissue]
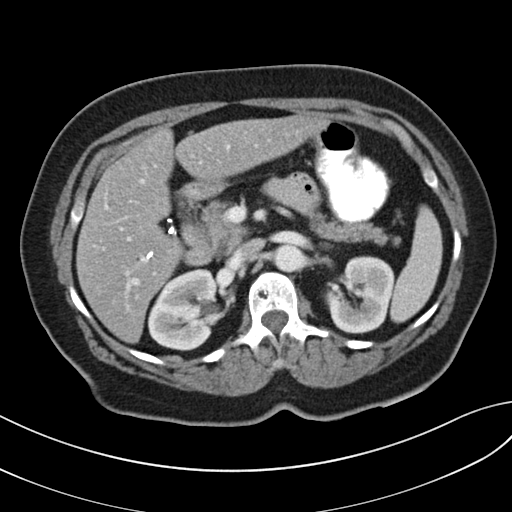
[im 68/97  bone]
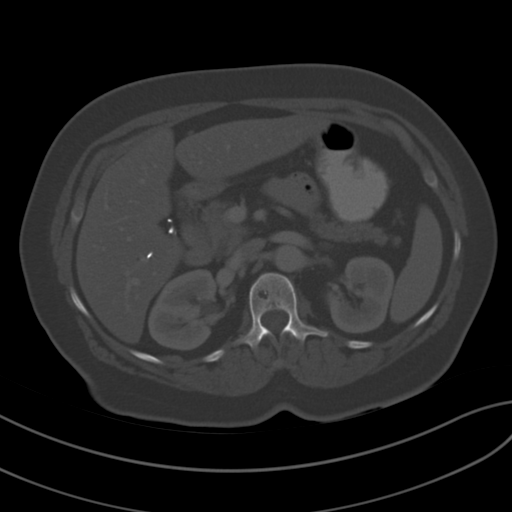
[im 77/97  soft-tissue]
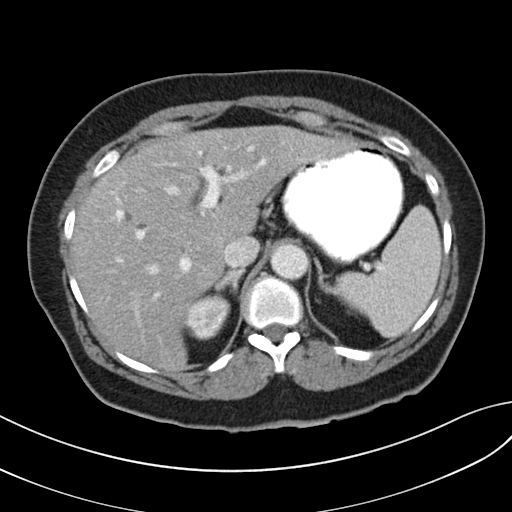
[im 77/97  lung]
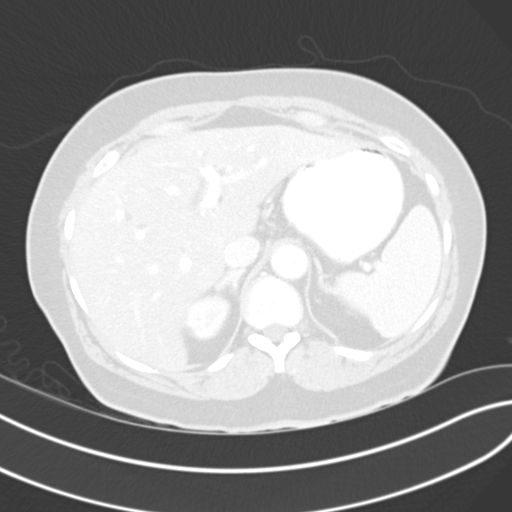
[im 82/97  soft-tissue]
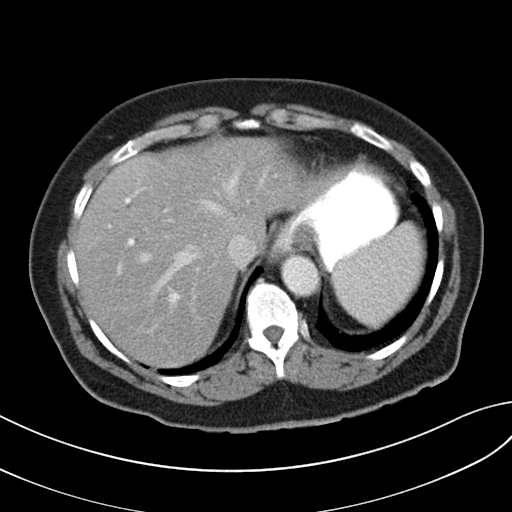
[im 82/97  lung]
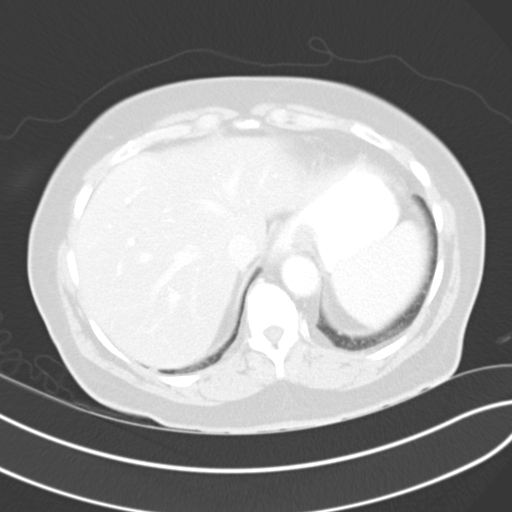
[im 87/97  lung]
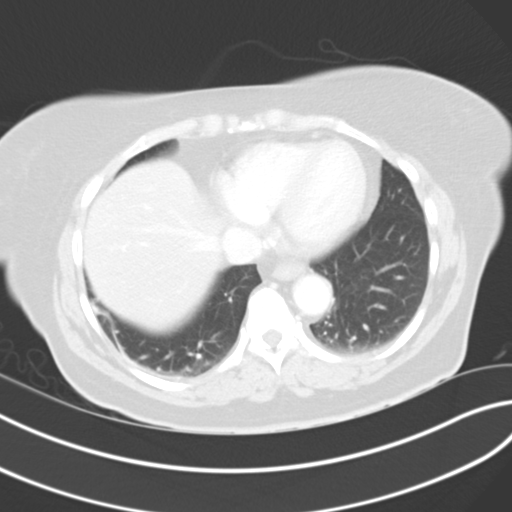
[im 92/97  soft-tissue]
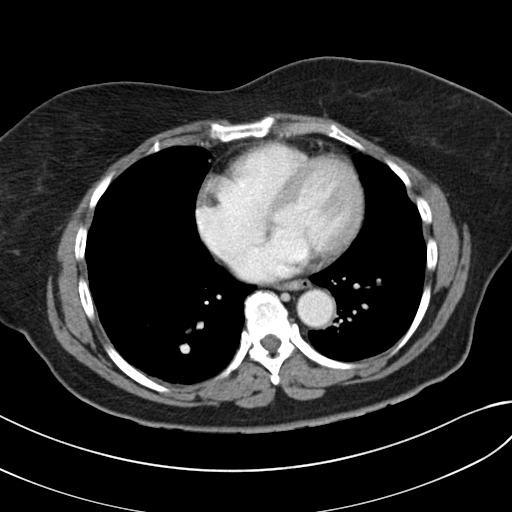
[im 92/97  lung]
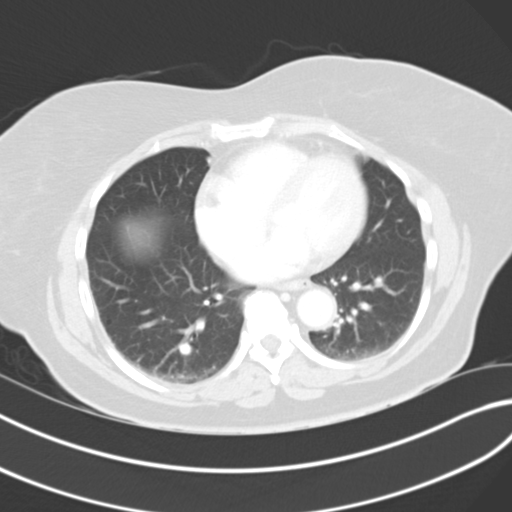

[13 of 32 positions shown; findings below may reference images not displayed]

FINDINGS: Mild dependent changes in the lung bases. Small peripheral collapse.
Surgical clip adjacent to the right cardiophrenic angle. Small
esophageal hiatal hernia.

Surgical absence of the gallbladder. There is mild intra and
extrahepatic bile duct dilatation. This may be physiologic in the
setting of previous cholecystectomy. No obstructing stone or mass is
visualized. Non radiopaque common duct stone is not excluded.
Consider further evaluation with liver function studies and if
positive, consider MRCP in the elective setting. Diffuse fatty
infiltration of the liver without focal liver lesion. Spleen size is
normal. The pancreas, adrenal glands, abdominal aorta, inferior vena
cava, and retroperitoneal lymph nodes are unremarkable. Multiple
small subcentimeter cysts in the kidneys. No solid mass or
hydronephrosis demonstrated. The stomach, small bowel, and colon are
not abnormally distended. No free air or free fluid in the abdomen.
Small umbilical hernia containing fat.

Pelvis: Uterus and ovaries are not enlarged. Diverticulosis of the
sigmoid colon with infiltration around the colon and with small
fluid collections inferior to the colon and in the perirectal space.
Changes are consistent with acute diverticulitis. Early abscess
measuring 1.5 cm diameter is suggested. The appendix is not
visualized. Bladder wall is not thickened. No significant pelvic
lymphadenopathy. The degenerative changes in the lumbar spine. No
destructive bone lesions appreciated.
IMPRESSION: Diverticulosis with acute diverticulitis in the sigmoid colon.
Pericolonic infiltration with small abscess measuring 1.5 cm
diameter.

## 2015-01-17 ENCOUNTER — Ambulatory Visit: Payer: Medicare Other | Admitting: Internal Medicine

## 2015-01-17 ENCOUNTER — Ambulatory Visit (INDEPENDENT_AMBULATORY_CARE_PROVIDER_SITE_OTHER): Payer: Medicare Other | Admitting: Internal Medicine

## 2015-01-17 ENCOUNTER — Other Ambulatory Visit (INDEPENDENT_AMBULATORY_CARE_PROVIDER_SITE_OTHER): Payer: Medicare Other

## 2015-01-17 ENCOUNTER — Encounter: Payer: Self-pay | Admitting: Internal Medicine

## 2015-01-17 VITALS — BP 112/74 | HR 68 | Temp 97.8°F | Resp 16 | Ht 66.0 in | Wt 197.0 lb

## 2015-01-17 DIAGNOSIS — R740 Nonspecific elevation of levels of transaminase and lactic acid dehydrogenase [LDH]: Secondary | ICD-10-CM

## 2015-01-17 DIAGNOSIS — F409 Phobic anxiety disorder, unspecified: Secondary | ICD-10-CM

## 2015-01-17 DIAGNOSIS — E890 Postprocedural hypothyroidism: Secondary | ICD-10-CM

## 2015-01-17 DIAGNOSIS — I10 Essential (primary) hypertension: Secondary | ICD-10-CM

## 2015-01-17 DIAGNOSIS — N182 Chronic kidney disease, stage 2 (mild): Secondary | ICD-10-CM

## 2015-01-17 DIAGNOSIS — K219 Gastro-esophageal reflux disease without esophagitis: Secondary | ICD-10-CM

## 2015-01-17 DIAGNOSIS — R110 Nausea: Secondary | ICD-10-CM

## 2015-01-17 DIAGNOSIS — F5105 Insomnia due to other mental disorder: Secondary | ICD-10-CM

## 2015-01-17 DIAGNOSIS — E785 Hyperlipidemia, unspecified: Secondary | ICD-10-CM

## 2015-01-17 DIAGNOSIS — R7402 Elevation of levels of lactic acid dehydrogenase (LDH): Secondary | ICD-10-CM

## 2015-01-17 DIAGNOSIS — R7401 Elevation of levels of liver transaminase levels: Secondary | ICD-10-CM

## 2015-01-17 LAB — COMPREHENSIVE METABOLIC PANEL
ALT: 39 U/L — ABNORMAL HIGH (ref 0–35)
AST: 26 U/L (ref 0–37)
Albumin: 4.1 g/dL (ref 3.5–5.2)
Alkaline Phosphatase: 90 U/L (ref 39–117)
BUN: 16 mg/dL (ref 6–23)
CO2: 28 mEq/L (ref 19–32)
Calcium: 10.1 mg/dL (ref 8.4–10.5)
Chloride: 106 mEq/L (ref 96–112)
Creatinine, Ser: 1.28 mg/dL — ABNORMAL HIGH (ref 0.40–1.20)
GFR: 53.04 mL/min — ABNORMAL LOW (ref >60.00)
Glucose, Bld: 101 mg/dL — ABNORMAL HIGH (ref 70–99)
Potassium: 3.7 mEq/L (ref 3.5–5.1)
Sodium: 141 mEq/L (ref 135–145)
Total Bilirubin: 0.9 mg/dL (ref 0.2–1.2)
Total Protein: 7.8 g/dL (ref 6.0–8.3)

## 2015-01-17 LAB — CBC WITH DIFFERENTIAL/PLATELET
Basophils Absolute: 0 10*3/uL (ref 0.0–0.1)
Basophils Relative: 0.3 % (ref 0.0–3.0)
Eosinophils Absolute: 0.5 10*3/uL (ref 0.0–0.7)
Eosinophils Relative: 6.2 % — ABNORMAL HIGH (ref 0.0–5.0)
HCT: 37.4 % (ref 36.0–46.0)
Hemoglobin: 12.9 g/dL (ref 12.0–15.0)
Lymphocytes Relative: 24.5 % (ref 12.0–46.0)
Lymphs Abs: 2.1 10*3/uL (ref 0.7–4.0)
MCHC: 34.7 g/dL (ref 30.0–36.0)
MCV: 83.6 fl (ref 78.0–100.0)
Monocytes Absolute: 0.3 10*3/uL (ref 0.1–1.0)
Monocytes Relative: 3.9 % (ref 3.0–12.0)
Neutro Abs: 5.6 10*3/uL (ref 1.4–7.7)
Neutrophils Relative %: 65.1 % (ref 43.0–77.0)
Platelets: 239 10*3/uL (ref 150.0–400.0)
RBC: 4.47 Mil/uL (ref 3.87–5.11)
RDW: 13.6 % (ref 11.5–15.5)
WBC: 8.6 10*3/uL (ref 4.0–10.5)

## 2015-01-17 LAB — URINALYSIS, ROUTINE W REFLEX MICROSCOPIC
Bilirubin Urine: NEGATIVE
Ketones, ur: NEGATIVE
Leukocytes, UA: NEGATIVE
Nitrite: NEGATIVE
Specific Gravity, Urine: 1.02 (ref 1.000–1.030)
Total Protein, Urine: 100 — AB
Urine Glucose: NEGATIVE
Urobilinogen, UA: 0.2 (ref 0.0–1.0)
pH: 6 (ref 5.0–8.0)

## 2015-01-17 LAB — LIPASE: Lipase: 4 U/L — ABNORMAL LOW (ref 11.0–59.0)

## 2015-01-17 LAB — TSH: TSH: 0.09 u[IU]/mL — ABNORMAL LOW (ref 0.35–4.50)

## 2015-01-17 LAB — AMYLASE: Amylase: 34 U/L (ref 27–131)

## 2015-01-17 MED ORDER — LEVOTHYROXINE SODIUM 112 MCG PO TABS
112.0000 ug | ORAL_TABLET | Freq: Every day | ORAL | Status: DC
Start: 2015-01-17 — End: 2015-04-19

## 2015-01-17 MED ORDER — ZOLPIDEM TARTRATE 5 MG PO TABS: 5.0000 mg | ORAL_TABLET | Freq: Every evening | ORAL | Status: DC | PRN

## 2015-01-17 NOTE — Assessment & Plan Note (Signed)
She is low risk and is not willing to take a statin

## 2015-01-17 NOTE — Progress Notes (Signed)
Pre visit review using our clinic review tool, if applicable. No additional management support is needed unless otherwise documented below in the visit note. 

## 2015-01-17 NOTE — Assessment & Plan Note (Signed)
Her TSh is slightly suppressed Will lower her synthroid dose

## 2015-01-17 NOTE — Patient Instructions (Signed)
Hypothyroidism The thyroid is a large gland located in the lower front of your neck. The thyroid gland helps control metabolism. Metabolism is how your body handles food. It controls metabolism with the hormone thyroxine. When this gland is underactive (hypothyroid), it produces too little hormone.  CAUSES These include:   Absence or destruction of thyroid tissue.  Goiter due to iodine deficiency.  Goiter due to medications.  Congenital defects (since birth).  Problems with the pituitary. This causes a lack of TSH (thyroid stimulating hormone). This hormone tells the thyroid to turn out more hormone. SYMPTOMS  Lethargy (feeling as though you have no energy)  Cold intolerance  Weight gain (in spite of normal food intake)  Dry skin  Coarse hair  Menstrual irregularity (if severe, may lead to infertility)  Slowing of thought processes Cardiac problems are also caused by insufficient amounts of thyroid hormone. Hypothyroidism in the newborn is cretinism, and is an extreme form. It is important that this form be treated adequately and immediately or it will lead rapidly to retarded physical and mental development. DIAGNOSIS  To prove hypothyroidism, your caregiver may do blood tests and ultrasound tests. Sometimes the signs are hidden. It may be necessary for your caregiver to watch this illness with blood tests either before or after diagnosis and treatment. TREATMENT  Low levels of thyroid hormone are increased by using synthetic thyroid hormone. This is a safe, effective treatment. It usually takes about four weeks to gain the full effects of the medication. After you have the full effect of the medication, it will generally take another four weeks for problems to leave. Your caregiver may start you on low doses. If you have had heart problems the dose may be gradually increased. It is generally not an emergency to get rapidly to normal. HOME CARE INSTRUCTIONS   Take your  medications as your caregiver suggests. Let your caregiver know of any medications you are taking or start taking. Your caregiver will help you with dosage schedules.  As your condition improves, your dosage needs may increase. It will be necessary to have continuing blood tests as suggested by your caregiver.  Report all suspected medication side effects to your caregiver. SEEK MEDICAL CARE IF: Seek medical care if you develop:  Sweating.  Tremulousness (tremors).  Anxiety.  Rapid weight loss.  Heat intolerance.  Emotional swings.  Diarrhea.  Weakness. SEEK IMMEDIATE MEDICAL CARE IF:  You develop chest pain, an irregular heart beat (palpitations), or a rapid heart beat. MAKE SURE YOU:   Understand these instructions.  Will watch your condition.  Will get help right away if you are not doing well or get worse. Document Released: 08/31/2005 Document Revised: 11/23/2011 Document Reviewed: 04/20/2008 ExitCare Patient Information 2015 [employer]. This information is not intended to replace advice given to you by your health care provider. Make sure you discuss any questions you have with your health care provider.  

## 2015-01-18 NOTE — Assessment & Plan Note (Signed)
Her BP is well controlled 

## 2015-01-18 NOTE — Assessment & Plan Note (Signed)
Her renal fxn is stable

## 2015-01-18 NOTE — Progress Notes (Signed)
Subjective:    Patient ID: Valerie Jackson, female    DOB: 04-Aug-1946, 69 y.o.   MRN: [id number]  Thyroid Problem Presents for follow-up visit. Patient reports no anxiety, cold intolerance, constipation, depressed mood, diaphoresis, diarrhea, dry skin, fatigue, hair loss, heat intolerance, hoarse voice, leg swelling, nail problem, palpitations, tremors, visual change, weight gain or weight loss. The symptoms have been improving. Past treatments include levothyroxine. The treatment provided significant relief.      Review of Systems  Constitutional: Negative.  Negative for fever, chills, weight loss, weight gain, diaphoresis, appetite change, fatigue and unexpected weight change.  HENT: Negative.  Negative for hoarse voice.   Eyes: Negative.   Respiratory: Negative.  Negative for cough, choking, chest tightness, shortness of breath, wheezing and stridor.   Cardiovascular: Negative.  Negative for chest pain, palpitations and leg swelling.  Gastrointestinal: Positive for nausea. Negative for vomiting, abdominal pain, diarrhea, constipation, blood in stool, abdominal distention and anal bleeding.       She has had a few episodes of nausea over the last few weeks, but no pain or vomiting  Endocrine: Negative.  Negative for cold intolerance and heat intolerance.  Genitourinary: Negative.   Musculoskeletal: Negative.  Negative for myalgias, back pain, joint swelling, arthralgias and neck pain.  Skin: Negative.  Negative for rash.  Allergic/Immunologic: Negative.   Neurological: Negative.  Negative for dizziness and tremors.  Hematological: Negative.  Negative for adenopathy. Does not bruise/bleed easily.  Psychiatric/Behavioral: Negative.  Negative for agitation. The patient is not nervous/anxious.        Objective:   Physical Exam  Constitutional: She is oriented to person, place, and time. She appears well-developed and well-nourished. No distress.  HENT:  Head: Normocephalic and  atraumatic.  Mouth/Throat: Oropharynx is clear and moist. No oropharyngeal exudate.  Eyes: Conjunctivae are normal. Right eye exhibits no discharge. Left eye exhibits no discharge. No scleral icterus.  Neck: Normal range of motion. Neck supple. No JVD present. No tracheal deviation present. No thyromegaly present.  Cardiovascular: Normal rate, regular rhythm, normal heart sounds and intact distal pulses.  Exam reveals no gallop and no friction rub.   No murmur heard. Pulmonary/Chest: Effort normal and breath sounds normal. No stridor. No respiratory distress. She has no wheezes. She has no rales. She exhibits no tenderness.  Abdominal: Soft. Bowel sounds are normal. She exhibits no distension and no mass. There is no tenderness. There is no rebound and no guarding.  Musculoskeletal: Normal range of motion. She exhibits no edema or tenderness.  Lymphadenopathy:    She has no cervical adenopathy.  Neurological: She is oriented to person, place, and time.  Skin: Skin is warm and dry. No rash noted. She is not diaphoretic. No erythema. No pallor.  Psychiatric: She has a normal mood and affect. Her behavior is normal. Judgment and thought content normal.  Vitals reviewed.    Lab Results  Component Value Date   WBC 8.6 01/17/2015   HGB 12.9 01/17/2015   HCT 37.4 01/17/2015   PLT 239.0 01/17/2015   GLUCOSE 101* 01/17/2015   CHOL 247* 09/18/2014   TRIG 152.0* 09/18/2014   HDL 50.10 09/18/2014   LDLDIRECT 226.0 02/27/2013   LDLCALC 167* 09/18/2014   ALT 39* 01/17/2015   AST 26 01/17/2015   NA 141 01/17/2015   K 3.7 01/17/2015   CL 106 01/17/2015   CREATININE 1.28* 01/17/2015   BUN 16 01/17/2015   CO2 28 01/17/2015   TSH 0.09* 01/17/2015   INR  1.04 02/21/2014   HGBA1C 4.7 05/14/2014       Assessment & Plan:

## 2015-01-18 NOTE — Assessment & Plan Note (Signed)
Exam and labs are WNL Will adjust her synthroid dose and follow for now

## 2015-01-28 ENCOUNTER — Encounter: Payer: Self-pay | Admitting: Internal Medicine

## 2015-04-19 ENCOUNTER — Telehealth: Payer: Self-pay | Admitting: *Deleted

## 2015-04-19 DIAGNOSIS — E890 Postprocedural hypothyroidism: Secondary | ICD-10-CM

## 2015-04-19 MED ORDER — LEVOTHYROXINE SODIUM 112 MCG PO TABS: 112.0000 ug | ORAL_TABLET | Freq: Every day | ORAL | Status: DC

## 2015-04-19 NOTE — Telephone Encounter (Signed)
Received call pt requesting refill on her levothyroxine rx need to go to [facility]. Inform pt will send.Rx fax to [facility]....Johny Chess

## 2015-05-23 ENCOUNTER — Encounter: Payer: Self-pay | Admitting: Internal Medicine

## 2015-05-23 ENCOUNTER — Ambulatory Visit (INDEPENDENT_AMBULATORY_CARE_PROVIDER_SITE_OTHER): Payer: Medicare Other | Admitting: Internal Medicine

## 2015-05-23 ENCOUNTER — Other Ambulatory Visit (INDEPENDENT_AMBULATORY_CARE_PROVIDER_SITE_OTHER): Payer: Medicare Other

## 2015-05-23 VITALS — BP 122/80 | HR 68 | Temp 97.7°F | Resp 16 | Ht 66.0 in | Wt 197.0 lb

## 2015-05-23 DIAGNOSIS — E785 Hyperlipidemia, unspecified: Secondary | ICD-10-CM

## 2015-05-23 DIAGNOSIS — R7401 Elevation of levels of liver transaminase levels: Secondary | ICD-10-CM

## 2015-05-23 DIAGNOSIS — N182 Chronic kidney disease, stage 2 (mild): Secondary | ICD-10-CM

## 2015-05-23 DIAGNOSIS — Z780 Asymptomatic menopausal state: Secondary | ICD-10-CM | POA: Diagnosis not present

## 2015-05-23 DIAGNOSIS — E038 Other specified hypothyroidism: Secondary | ICD-10-CM

## 2015-05-23 DIAGNOSIS — R740 Nonspecific elevation of levels of transaminase and lactic acid dehydrogenase [LDH]: Secondary | ICD-10-CM

## 2015-05-23 DIAGNOSIS — I10 Essential (primary) hypertension: Secondary | ICD-10-CM

## 2015-05-23 DIAGNOSIS — Z1231 Encounter for screening mammogram for malignant neoplasm of breast: Secondary | ICD-10-CM

## 2015-05-23 DIAGNOSIS — J439 Emphysema, unspecified: Secondary | ICD-10-CM

## 2015-05-23 DIAGNOSIS — R7402 Elevation of levels of lactic acid dehydrogenase (LDH): Secondary | ICD-10-CM

## 2015-05-23 LAB — LIPID PANEL
Cholesterol: 219 mg/dL — ABNORMAL HIGH (ref 0–200)
HDL: 46 mg/dL (ref >39.00)
LDL Cholesterol: 147 mg/dL — ABNORMAL HIGH (ref 0–99)
NonHDL: 172.55
Total CHOL/HDL Ratio: 5
Triglycerides: 126 mg/dL (ref 0.0–149.0)
VLDL: 25.2 mg/dL (ref 0.0–40.0)

## 2015-05-23 LAB — COMPREHENSIVE METABOLIC PANEL
ALT: 23 U/L (ref 0–35)
AST: 19 U/L (ref 0–37)
Albumin: 4.3 g/dL (ref 3.5–5.2)
Alkaline Phosphatase: 84 U/L (ref 39–117)
BUN: 13 mg/dL (ref 6–23)
CO2: 26 mEq/L (ref 19–32)
Calcium: 9.9 mg/dL (ref 8.4–10.5)
Chloride: 106 mEq/L (ref 96–112)
Creatinine, Ser: 1.31 mg/dL — ABNORMAL HIGH (ref 0.40–1.20)
GFR: 51.58 mL/min — ABNORMAL LOW (ref >60.00)
Glucose, Bld: 94 mg/dL (ref 70–99)
Potassium: 3.8 mEq/L (ref 3.5–5.1)
Sodium: 141 mEq/L (ref 135–145)
Total Bilirubin: 0.9 mg/dL (ref 0.2–1.2)
Total Protein: 7.7 g/dL (ref 6.0–8.3)

## 2015-05-23 LAB — TSH: TSH: 1.52 u[IU]/mL (ref 0.35–4.50)

## 2015-05-23 MED ORDER — ACLIDINIUM BROMIDE 400 MCG/ACT IN AEPB: 1.0000 | INHALATION_SPRAY | Freq: Two times a day (BID) | RESPIRATORY_TRACT | Status: DC

## 2015-05-23 NOTE — Patient Instructions (Signed)
Hypothyroidism The thyroid is a large gland located in the lower front of your neck. The thyroid gland helps control metabolism. Metabolism is how your body handles food. It controls metabolism with the hormone thyroxine. When this gland is underactive (hypothyroid), it produces too little hormone.  CAUSES These include:   Absence or destruction of thyroid tissue.  Goiter due to iodine deficiency.  Goiter due to medications.  Congenital defects (since birth).  Problems with the pituitary. This causes a lack of TSH (thyroid stimulating hormone). This hormone tells the thyroid to turn out more hormone. SYMPTOMS  Lethargy (feeling as though you have no energy)  Cold intolerance  Weight gain (in spite of normal food intake)  Dry skin  Coarse hair  Menstrual irregularity (if severe, may lead to infertility)  Slowing of thought processes Cardiac problems are also caused by insufficient amounts of thyroid hormone. Hypothyroidism in the newborn is cretinism, and is an extreme form. It is important that this form be treated adequately and immediately or it will lead rapidly to retarded physical and mental development. DIAGNOSIS  To prove hypothyroidism, your caregiver may do blood tests and ultrasound tests. Sometimes the signs are hidden. It may be necessary for your caregiver to watch this illness with blood tests either before or after diagnosis and treatment. TREATMENT  Low levels of thyroid hormone are increased by using synthetic thyroid hormone. This is a safe, effective treatment. It usually takes about four weeks to gain the full effects of the medication. After you have the full effect of the medication, it will generally take another four weeks for problems to leave. Your caregiver may start you on low doses. If you have had heart problems the dose may be gradually increased. It is generally not an emergency to get rapidly to normal. HOME CARE INSTRUCTIONS   Take your  medications as your caregiver suggests. Let your caregiver know of any medications you are taking or start taking. Your caregiver will help you with dosage schedules.  As your condition improves, your dosage needs may increase. It will be necessary to have continuing blood tests as suggested by your caregiver.  Report all suspected medication side effects to your caregiver. SEEK MEDICAL CARE IF: Seek medical care if you develop:  Sweating.  Tremulousness (tremors).  Anxiety.  Rapid weight loss.  Heat intolerance.  Emotional swings.  Diarrhea.  Weakness. SEEK IMMEDIATE MEDICAL CARE IF:  You develop chest pain, an irregular heart beat (palpitations), or a rapid heart beat. MAKE SURE YOU:   Understand these instructions.  Will watch your condition.  Will get help right away if you are not doing well or get worse. Document Released: 08/31/2005 Document Revised: 11/23/2011 Document Reviewed: 04/20/2008 ExitCare Patient Information 2015 [employer]. This information is not intended to replace advice given to you by your health care provider. Make sure you discuss any questions you have with your health care provider.  

## 2015-05-23 NOTE — Progress Notes (Signed)
Pre visit review using our clinic review tool, if applicable. No additional management support is needed unless otherwise documented below in the visit note. 

## 2015-05-23 NOTE — Progress Notes (Signed)
Subjective:  Patient ID: Valerie Jackson, female    DOB: 03-Jun-1946  Age: 69 y.o. MRN: [id number]  CC: Hypothyroidism; Hyperlipidemia; and COPD   HPI Valerie Jackson presents for follow-up. She tells me that Spiriva is too expensive and she needs to try something different. She feels well today and does not offer any complaints. Her degree of shortness of breath is better than before.   Outpatient Prescriptions Prior to Visit  Medication Sig Dispense Refill  . levothyroxine (SYNTHROID, LEVOTHROID) 112 MCG tablet Take 1 tablet (112 mcg total) by mouth daily. 90 tablet 3  . zolpidem (AMBIEN) 5 MG tablet Take 1 tablet (5 mg total) by mouth at bedtime as needed for sleep. 30 tablet 5  . meclizine (ANTIVERT) 12.5 MG tablet Take 12.5 mg by mouth 2 (two) times daily.     . Tiotropium Bromide Monohydrate (SPIRIVA RESPIMAT) 2.5 MCG/ACT AERS Inhale 2 puffs into the lungs daily. 4 g 11   No facility-administered medications prior to visit.    ROS Review of Systems  Constitutional: Negative.  Negative for fever, chills, diaphoresis, activity change, appetite change, fatigue and unexpected weight change.  HENT: Negative.  Negative for trouble swallowing.   Eyes: Negative.   Respiratory: Positive for shortness of breath. Negative for cough, choking, chest tightness and stridor.   Cardiovascular: Negative.  Negative for chest pain, palpitations and leg swelling.  Gastrointestinal: Negative.  Negative for nausea, vomiting, abdominal pain, diarrhea, constipation and blood in stool.  Endocrine: Negative.   Genitourinary: Negative.   Musculoskeletal: Negative.  Negative for myalgias, back pain, arthralgias and neck pain.  Skin: Negative.   Allergic/Immunologic: Negative.   Neurological: Negative.  Negative for dizziness, syncope, speech difficulty, light-headedness, numbness and headaches.  Hematological: Negative.   Psychiatric/Behavioral: Negative.     Objective:  BP 122/80 mmHg  Pulse 68   Temp(Src) 97.7 F (36.5 C) (Oral)  Resp 16  Ht 5' 6"  (1.676 m)  Wt 197 lb (89.359 kg)  BMI 31.81 kg/m2  SpO2 97%  BP Readings from Last 3 Encounters:  05/23/15 122/80  01/17/15 112/74  09/18/14 130/84    Wt Readings from Last 3 Encounters:  05/23/15 197 lb (89.359 kg)  01/17/15 197 lb (89.359 kg)  09/18/14 197 lb (89.359 kg)    Physical Exam  Constitutional: She is oriented to person, place, and time. She appears well-developed and well-nourished. No distress.  HENT:  Head: Normocephalic and atraumatic.  Nose: Nose normal.  Mouth/Throat: Oropharynx is clear and moist.  Eyes: Conjunctivae are normal. Right eye exhibits no discharge. Left eye exhibits no discharge. No scleral icterus.  Neck: Normal range of motion. Neck supple. No JVD present. No tracheal deviation present. No thyromegaly present.  Cardiovascular: Normal rate, regular rhythm, normal heart sounds and intact distal pulses.  Exam reveals no gallop and no friction rub.   No murmur heard. Pulmonary/Chest: Effort normal and breath sounds normal. No stridor. No respiratory distress. She has no wheezes. She has no rales. She exhibits no tenderness.  Abdominal: Soft. Bowel sounds are normal. She exhibits no distension and no mass. There is no tenderness. There is no rebound and no guarding.  Musculoskeletal: Normal range of motion. She exhibits no edema or tenderness.  Lymphadenopathy:    She has no cervical adenopathy.  Neurological: She is oriented to person, place, and time.  Skin: Skin is warm and dry. No rash noted. She is not diaphoretic. No erythema. No pallor.  Vitals reviewed.   Lab Results  Component Value Date   WBC 8.6 01/17/2015   HGB 12.9 01/17/2015   HCT 37.4 01/17/2015   PLT 239.0 01/17/2015   GLUCOSE 94 05/23/2015   CHOL 219* 05/23/2015   TRIG 126.0 05/23/2015   HDL 46.00 05/23/2015   LDLDIRECT 226.0 02/27/2013   LDLCALC 147* 05/23/2015   ALT 23 05/23/2015   AST 19 05/23/2015   NA 141  05/23/2015   K 3.8 05/23/2015   CL 106 05/23/2015   CREATININE 1.31* 05/23/2015   BUN 13 05/23/2015   CO2 26 05/23/2015   TSH 1.52 05/23/2015   INR 1.04 02/21/2014   HGBA1C 4.7 05/14/2014    Dg Colon W/water Sol Cm  04/11/2014   CLINICAL DATA:  Anastomotic leak at the rectum.  EXAM: COLON WITH WATER SOLUTION CONTRAST  COMPARISON:  Multiple exams, including 03/21/2014  FINDINGS: We performed exam using dilute water-soluble contrast medium.  I reviewed the patient's CT scans prior to performing the exam. Because of the fairly low rectal anastomotic site, I placed the single contrast enema catheter myself, using a somewhat low positioning and without using retention balloon insufflation in order to avoid trauma to the anastomotic region. The catheter was gently inserted into the lower rectum without resistance or significant discomfort on the part of the patient.  Subsequently water-soluble contrast was instilled into the rectum. The caliber of the lower rectum is greater than the caliber of the bowel proximal to the anastomosis, although this is likely incidental to the patient's lack of food intake.  There is a 1.6 cm long, 0.3 cm in diameter finger-like projection of contrast from the right anterior portion of the anastomotic site extending cephalad as shown on series 14. This contains a small amount of gas in addition to contrast medium. This outpouching is well contained, without generalized leakage into the pelvis.  During the course of the exam, contrast filled the colon although today's exam was focused on the rectal region and not the rest of the colon. The appendix was noted to fill.  IMPRESSION: 1. 1.6 cm long, 3 mm in diameter finger-like contained outpouching from the right anterior portion of the anastomotic site extending cephalad. Contrast medium is contained in this region along with a small amount of gas. There was not generalized leakage into the pelvis.   Electronically Signed   By: Sherryl Barters M.D.   On: 04/11/2014 10:56    Assessment & Plan:   Valerie Jackson was seen today for hypothyroidism, hyperlipidemia and copd.  Diagnoses and all orders for this visit:  Other specified hypothyroidism- her TSH is in the normal range, she will stay on the current dose of levothyroxine. -     TSH; Future  CKD (chronic kidney disease) stage 2, GFR 60-89 ml/min- her renal function is stable and her blood pressure is well-controlled. -     Comprehensive metabolic panel; Future  Essential hypertension- blood pressure is well-controlled, electrolytes and renal function are stable. -     Comprehensive metabolic panel; Future  Post-menopausal -     DG Bone Density; Future  Visit for screening mammogram -     MM Digital Screening; Future  Nonspecific elevation of levels of transaminase or lactic acid dehydrogenase (LDH) -     Comprehensive metabolic panel; Future  Hyperlipidemia with target LDL less than 160- she has a low Framingham risk score so I do not recommend that she start a statin. -     Lipid panel; Future -     TSH; Future  Pulmonary emphysema, unspecified emphysema type- she is doing well on Spiriva but due to its cost I will change to [place]. -     Aclidinium Bromide (TUDORZA PRESSAIR) 400 MCG/ACT AEPB; Inhale 1 puff into the lungs 2 (two) times daily.  I have discontinued Ms. Gibas Tiotropium Bromide Monohydrate and meclizine. I am also having her start on Aclidinium Bromide. Additionally, I am having her maintain her zolpidem and levothyroxine.  Meds ordered this encounter  Medications  . Aclidinium Bromide (TUDORZA PRESSAIR) 400 MCG/ACT AEPB    Sig: Inhale 1 puff into the lungs 2 (two) times daily.    Dispense:  3 each    Refill:  3     Follow-up: Return in about 4 months (around 09/22/2015).  [name], MD

## 2015-05-26 ENCOUNTER — Encounter: Payer: Self-pay | Admitting: Internal Medicine

## 2015-06-25 ENCOUNTER — Encounter (HOSPITAL_COMMUNITY): Payer: Self-pay

## 2015-06-25 ENCOUNTER — Telehealth: Payer: Self-pay | Admitting: Internal Medicine

## 2015-06-25 ENCOUNTER — Emergency Department (HOSPITAL_COMMUNITY)
Admission: EM | Admit: 2015-06-25 | Discharge: 2015-06-25 | Disposition: A | Discharge status: Home / Self Care | Payer: Medicare Other | Attending: Emergency Medicine | Admitting: Emergency Medicine

## 2015-06-25 DIAGNOSIS — Z87891 Personal history of nicotine dependence: Secondary | ICD-10-CM | POA: Diagnosis not present

## 2015-06-25 DIAGNOSIS — E039 Hypothyroidism, unspecified: Secondary | ICD-10-CM | POA: Diagnosis not present

## 2015-06-25 DIAGNOSIS — H538 Other visual disturbances: Secondary | ICD-10-CM

## 2015-06-25 DIAGNOSIS — Z79899 Other long term (current) drug therapy: Secondary | ICD-10-CM | POA: Diagnosis not present

## 2015-06-25 DIAGNOSIS — I10 Essential (primary) hypertension: Secondary | ICD-10-CM

## 2015-06-25 DIAGNOSIS — Z8739 Personal history of other diseases of the musculoskeletal system and connective tissue: Secondary | ICD-10-CM | POA: Diagnosis not present

## 2015-06-25 DIAGNOSIS — E669 Obesity, unspecified: Secondary | ICD-10-CM | POA: Insufficient documentation

## 2015-06-25 DIAGNOSIS — Z8659 Personal history of other mental and behavioral disorders: Secondary | ICD-10-CM | POA: Insufficient documentation

## 2015-06-25 DIAGNOSIS — R51 Headache: Secondary | ICD-10-CM

## 2015-06-25 DIAGNOSIS — R011 Cardiac murmur, unspecified: Secondary | ICD-10-CM | POA: Insufficient documentation

## 2015-06-25 DIAGNOSIS — Z8719 Personal history of other diseases of the digestive system: Secondary | ICD-10-CM | POA: Diagnosis not present

## 2015-06-25 DIAGNOSIS — E876 Hypokalemia: Secondary | ICD-10-CM | POA: Insufficient documentation

## 2015-06-25 DIAGNOSIS — R519 Headache, unspecified: Secondary | ICD-10-CM

## 2015-06-25 DIAGNOSIS — Z872 Personal history of diseases of the skin and subcutaneous tissue: Secondary | ICD-10-CM | POA: Diagnosis not present

## 2015-06-25 DIAGNOSIS — Z86718 Personal history of other venous thrombosis and embolism: Secondary | ICD-10-CM | POA: Diagnosis not present

## 2015-06-25 LAB — BASIC METABOLIC PANEL
Anion gap: 7 (ref 5–15)
BUN: 14 mg/dL (ref 6–20)
CO2: 24 mmol/L (ref 22–32)
Calcium: 9.4 mg/dL (ref 8.9–10.3)
Chloride: 107 mmol/L (ref 101–111)
Creatinine, Ser: 1.08 mg/dL — ABNORMAL HIGH (ref 0.44–1.00)
GFR calc Af Amer: 59 mL/min — ABNORMAL LOW (ref >60)
GFR calc non Af Amer: 51 mL/min — ABNORMAL LOW (ref >60)
Glucose, Bld: 95 mg/dL (ref 65–99)
Potassium: 3.6 mmol/L (ref 3.5–5.1)
Sodium: 138 mmol/L (ref 135–145)

## 2015-06-25 LAB — CBC
HCT: 37.8 % (ref 36.0–46.0)
Hemoglobin: 13 g/dL (ref 12.0–15.0)
MCH: 30.4 pg (ref 26.0–34.0)
MCHC: 34.4 g/dL (ref 30.0–36.0)
MCV: 88.3 fL (ref 78.0–100.0)
Platelets: 131 10*3/uL — ABNORMAL LOW (ref 150–400)
RBC: 4.28 MIL/uL (ref 3.87–5.11)
RDW: 12.8 % (ref 11.5–15.5)
WBC: 5.7 10*3/uL (ref 4.0–10.5)

## 2015-06-25 MED ORDER — AMLODIPINE BESYLATE 5 MG PO TABS: 5.0000 mg | ORAL_TABLET | Freq: Once | ORAL | Status: DC

## 2015-06-25 MED ORDER — AMLODIPINE BESYLATE 5 MG PO TABS
5.0000 mg | ORAL_TABLET | Freq: Once | ORAL | Status: AC
  Administered 2015-06-25: 5 mg via ORAL
  Filled 2015-06-25: qty 1

## 2015-06-25 NOTE — Discharge Instructions (Signed)
Blurred Vision Having blurred vision means that you cannot see things clearly. Your vision may seem fuzzy or out of focus. Blurred vision is a very common symptom of an eye or vision problem. Blurred vision is often a gradual blur that occurs in one eye or both eyes. There are many causes of blurred vision, including cataracts, macular degeneration, and diabetic retinopathy. Blurred vision can be diagnosed based on your symptoms and a physical exam. Tell your health care provider about any other health problems you have, any recent eye injury, and any prior surgeries. You may need to see a health care provider who specializes in eye problems (ophthalmologist). Your treatment depends on what is causing your blurred vision.  HOME CARE INSTRUCTIONS  Tell your health care provider about any changes in your blurred vision.  Do not drive or operate heavy machinery if your vision is blurry.  Keep all follow-up visits as directed by your health care provider. This is important. SEEK MEDICAL CARE IF:  Your symptoms get worse.  You have new symptoms.  You have trouble seeing at night.  You have trouble seeing up close or far away.  You have trouble noticing the difference between colors. SEEK IMMEDIATE MEDICAL CARE IF:  You have severe eye pain.  You have a severe headache.  You have flashing lights in your field of vision.  You have a sudden change in vision.  You have a sudden loss of vision.  You have vision change after an injury.  You notice drainage coming from your eyes.  You notice a rash around your eyes.   This information is not intended to replace advice given to you by your health care provider. Make sure you discuss any questions you have with your health care provider.   Document Released: 09/03/2003 Document Revised: 01/15/2015 Document Reviewed: 07/25/2014 Elsevier Interactive Patient Education 2016 [place] Headache Without Cause A headache is pain  or discomfort felt around the head or neck area. The specific cause of a headache may not be found. There are many causes and types of headaches. A few common ones are:  Tension headaches.  Migraine headaches.  Cluster headaches.  Chronic daily headaches. HOME CARE INSTRUCTIONS  Watch your condition for any changes. Take these steps to help with your condition: Managing Pain  Take over-the-counter and prescription medicines only as told by your health care provider.  Lie down in a dark, quiet room when you have a headache.  If directed, apply ice to the head and neck area:  Put ice in a plastic bag.  Place a towel between your skin and the bag.  Leave the ice on for 20 minutes, 2-3 times per day.  Use a heating pad or hot shower to apply heat to the head and neck area as told by your health care provider.  Keep lights dim if bright lights bother you or make your headaches worse. Eating and Drinking  Eat meals on a regular schedule.  Limit alcohol use.  Decrease the amount of caffeine you drink, or stop drinking caffeine. General Instructions  Keep all follow-up visits as told by your health care provider. This is important.  Keep a headache journal to help find out what may trigger your headaches. For example, write down:  What you eat and drink.  How much sleep you get.  Any change to your diet or medicines.  Try massage or other relaxation techniques.  Limit stress.  Sit up straight, and do not  tense your muscles.  Do not use tobacco products, including cigarettes, chewing tobacco, or e-cigarettes. If you need help quitting, ask your health care provider.  Exercise regularly as told by your health care provider.  Sleep on a regular schedule. Get 7-9 hours of sleep, or the amount recommended by your health care provider. SEEK MEDICAL CARE IF:   Your symptoms are not helped by medicine.  You have a headache that is different from the usual  headache.  You have nausea or you vomit.  You have a fever. SEEK IMMEDIATE MEDICAL CARE IF:   Your headache becomes severe.  You have repeated vomiting.  You have a stiff neck.  You have a loss of vision.  You have problems with speech.  You have pain in the eye or ear.  You have muscular weakness or loss of muscle control.  You lose your balance or have trouble walking.  You feel faint or pass out.  You have confusion.   This information is not intended to replace advice given to you by your health care provider. Make sure you discuss any questions you have with your health care provider.   Document Released: 08/31/2005 Document Revised: 05/22/2015 Document Reviewed: 12/24/2014 Elsevier Interactive Patient Education [employer].

## 2015-06-25 NOTE — ED Provider Notes (Signed)
CSN: [id number]     Arrival date & time 06/25/15  1120 History   First MD Initiated Contact with Patient 06/25/15 1151     Chief Complaint  Patient presents with  . Hypertension  . Blurred Vision  . Headache     (Consider location/radiation/quality/duration/timing/severity/associated sxs/prior Treatment) HPI Comments: Acute onset of mild L sided headache and mild peripherally blurred vision in her left eye only. Similar to prior episode of hypertension, today her BP at home was 190s/100s. No headache, nausea, vomiting, dizziness.  Patient is a 69 y.o. female presenting with hypertension and headaches. The history is provided by the patient.  Hypertension This is a chronic problem. The current episode started 1 to 2 hours ago. The problem occurs constantly. The problem has not changed since onset.Associated symptoms include headaches. Pertinent negatives include no abdominal pain and no shortness of breath. Nothing aggravates the symptoms. Nothing relieves the symptoms.  Headache Associated symptoms: no abdominal pain, no cough, no fever and no vomiting     Past Medical History  Diagnosis Date  . Hypertension   . Hypothyroid   . Depression   . Hypercholesteremia   . Sickle cell trait ([place])   . GERD (gastroesophageal reflux disease)   . History of blood transfusion 1965  . Diverticulitis 09/2013    with perforation  . Pericolonic abscess 2015  . Obesity   . Hypokalemia   . H/O hiatal hernia   . Arthritis     fingers  . Headache(784.0)     migraine every 5- to 6 years  . Blood clot in vein 1968  . Hepatitis age 61 or 65    had heapatitis, not sure which kind  . Heart murmur since age 73    mild  . Diverticulitis of colon with perforation 09/28/2013   Past Surgical History  Procedure Laterality Date  . Cholecystectomy  2002  . Tonsillectomy  1952  . Colon resection N/A 10/11/2013    Procedure: EXPLORATORY LAPAROTOMY WITH RECTOSIGMOID COLECTOMY AND END COLOSTOMY;   Surgeon: Stark Klein, MD;  Location: [facility];  Service: General;  Laterality: N/A;  . Colostomy takedown N/A 02/27/2014    Procedure: COLOSTOMY TAKEDOWN [name] HERNIA REPAIR;  Surgeon: Stark Klein, MD;  Location: [facility];  Service: General;  Laterality: N/A;   Family History  Problem Relation Age of Onset  . Hypertension Mother   . Aneurysm Mother   . Cancer Mother     Uterine  . Thyroid cancer Mother   . Cancer Other   . Thyroid cancer Sister   . Arthritis Sister   . Hyperlipidemia Sister   . Hypertension Sister   . Stroke Sister   . Kidney disease Sister   . Hyperlipidemia Brother   . Thyroid cancer Daughter   . Breast cancer Maternal Grandmother   . Hypertension Maternal Grandmother    Social History  Substance Use Topics  . Smoking status: Former Smoker -- 0.25 packs/day for 25 years    Types: Cigarettes    Quit date: 09/06/2013  . Smokeless tobacco: Never Used  . Alcohol Use: No   OB History    No data available     Review of Systems  Constitutional: Negative for fever.  Respiratory: Negative for cough and shortness of breath.   Gastrointestinal: Negative for vomiting and abdominal pain.  Neurological: Positive for headaches. Facial asymmetry: mild, L sided.  All other systems reviewed and are negative.     Allergies  Estrogens  Home Medications  Prior to Admission medications   Medication Sig Start Date End Date Taking? Authorizing Provider  Aclidinium Bromide (TUDORZA PRESSAIR) 400 MCG/ACT AEPB Inhale 1 puff into the lungs 2 (two) times daily. 05/23/15  Yes Janith Lima, MD  levothyroxine (SYNTHROID, LEVOTHROID) 112 MCG tablet Take 1 tablet (112 mcg total) by mouth daily. 04/19/15  Yes Janith Lima, MD  MAGNESIUM PO Take 1 tablet by mouth daily.   Yes Historical Provider, MD  POTASSIUM PO Take 1 tablet by mouth daily.   Yes Historical Provider, MD  zolpidem (AMBIEN) 5 MG tablet Take 1 tablet (5 mg total) by mouth at bedtime as needed for sleep.  01/17/15  Yes Janith Lima, MD   BP 171/92 mmHg  Pulse 66  Temp(Src) 98.1 F (36.7 C) (Oral)  Resp 16  SpO2 98% Physical Exam  Constitutional: She is oriented to person, place, and time. She appears well-developed and well-nourished. No distress.  HENT:  Head: Normocephalic and atraumatic.  Mouth/Throat: Oropharynx is clear and moist.  Eyes: EOM are normal. Pupils are equal, round, and reactive to light.  Neck: Normal range of motion. Neck supple.  Cardiovascular: Normal rate and regular rhythm.  Exam reveals no friction rub.   No murmur heard. Pulmonary/Chest: Effort normal and breath sounds normal. No respiratory distress. She has no wheezes. She has no rales.  Abdominal: Soft. She exhibits no distension. There is no tenderness. There is no rebound.  Musculoskeletal: Normal range of motion. She exhibits no edema.  Neurological: She is alert and oriented to person, place, and time. No cranial nerve deficit or sensory deficit. She exhibits normal muscle tone. GCS eye subscore is 4. GCS verbal subscore is 5. GCS motor subscore is 6.  States mild L sided peripherally blurred vision  Skin: No rash noted. She is not diaphoretic.  Nursing note and vitals reviewed.   ED Course  Procedures (including critical care time) Labs Review Labs Reviewed  CBC - Abnormal; Notable for the following:    Platelets 131 (*)    All other components within normal limits  BASIC METABOLIC PANEL - Abnormal; Notable for the following:    Creatinine, Ser 1.08 (*)    GFR calc non Af Amer 51 (*)    GFR calc Af Amer 59 (*)    All other components within normal limits    Imaging Review No results found. I have personally reviewed and evaluated these images and lab results as part of my medical decision-making.   EKG Interpretation None      MDM   Final diagnoses:  Acute nonintractable headache, unspecified headache type  Blurry vision, left eye  Essential hypertension    69 year old female  here with left-sided headache and blurred vision. This is similar to prior episodes of hypertension. Blood pressure at home was systolic in the 355D. She does not take any medicines for high blood pressure because it has caused significant drops in her blood pressure so she refuses to take any antihypertensive medications. No fever, vomiting, nausea, chest pain, shortness of breath, numbness, taking. Neurovascularly she is intact. Has a normal neurologic exam except for mild left-sided peripheral vision. She has no field cuts. I do not feel she meets code stroke criteria. We'll give 5 mg of amlodipine and speak with neurology.  Neuro states this doesn't sound like a stroke, which I agree with. Patient's headache resolved and blurry vision improved after BP meds. She was amenable to beginning amlodipine for her BP and she will f/u  with her PCP. Ophtho f/u given for her blurry vision.  Evelina Bucy, MD 06/25/15 [phone]

## 2015-06-25 NOTE — ED Notes (Signed)
Discharge instructions and rx x1 reviewed with patient. Patient verbalized understanding

## 2015-06-25 NOTE — ED Notes (Signed)
Pt c/o elevated BP, L side headache, and blurred vision in L eye starting this morning.  Pain score 2/10.  Pt reports only peripheral vision is blurred.  Sts blood pressure was in the 190s at home.  Pt unable to state when symptoms started/last known well.

## 2015-06-25 NOTE — Telephone Encounter (Signed)
PLEASE NOTE: All timestamps contained within this report are represented as [place] Standard Time. CONFIDENTIALTY NOTICE: This fax transmission is intended only for the addressee. It contains information that is legally privileged, confidential or otherwise protected from use or disclosure. If you are not the intended recipient, you are strictly prohibited from reviewing, disclosing, copying using or disseminating any of this information or taking any action in reliance on or regarding this information. If you have received this fax in error, please notify us immediately by telephone so that we can arrange for its return to [place]. Phone: [phone], Toll-Free: [phone], Fax: [phone] Page: 1 of 1 Call Id: [id number] House [facility] - Client [place] Patient Name: Valerie Jackson DOB: [birth year] Initial Comment Caller states, BP this morning is 170/100 she has slight blurring on Lt side peripheral vision Nurse Assessment Nurse: Markus Daft, RN, Sherre Poot Date/Time (Eastern Time): 06/25/2015 10:33:15 AM Confirm and document reason for call. If symptomatic, describe symptoms. ---Caller states that her BP this morning is 170/100. She has slight blurring on peripheral vision of left side. Noticed vision changes this AM. Denies CP, SOB. Has the patient traveled out of the country within the last 30 days? ---Not Applicable Does the patient have any new or worsening symptoms? ---Yes Will a triage be completed? ---Yes Related visit to physician within the last 2 weeks? ---No Does the PT have any chronic conditions? (i.e. diabetes, asthma, etc.) ---Yes List chronic conditions. ---does not tolerate HTN meds - not on meds; hypothyroid Guidelines Guideline Title Affirmed Question Affirmed Notes High Blood Pressure [1] BP # 160 / 100 AND [2] cardiac or neurologic symptoms (e.g., chest pain, difficulty breathing, unsteady gait,  blurred vision) Final Disposition User Go to ED Now Markus Daft, RN, Sherre Poot Comments Caller wanted to be seen in office rather than come to ER immediately. RN explained rational as she is having s/s with HTN. -- Caller verbalized understanding and rechecked BP 178/99. She now states that she will go to ER. Referrals GO TO FACILITY UNDECIDED - [facility] Disagree/Comply: Comply

## 2015-07-03 ENCOUNTER — Ambulatory Visit (INDEPENDENT_AMBULATORY_CARE_PROVIDER_SITE_OTHER)
Admission: RE | Admit: 2015-07-03 | Discharge: 2015-07-03 | Disposition: A | Discharge status: Home / Self Care | Payer: Medicare Other | Source: Ambulatory Visit | Attending: Internal Medicine | Admitting: Internal Medicine

## 2015-07-03 DIAGNOSIS — Z780 Asymptomatic menopausal state: Secondary | ICD-10-CM | POA: Diagnosis not present

## 2015-07-04 ENCOUNTER — Telehealth: Payer: Self-pay

## 2015-07-04 NOTE — Telephone Encounter (Signed)
Nurse; agreed to come in 11/3 at [place] for AWV

## 2015-07-08 LAB — HM DEXA SCAN: HM Dexa Scan: -1.8

## 2015-07-10 ENCOUNTER — Other Ambulatory Visit: Payer: Self-pay

## 2015-07-10 MED ORDER — AMLODIPINE BESYLATE 5 MG PO TABS: 5.0000 mg | ORAL_TABLET | Freq: Once | ORAL | Status: DC

## 2015-07-15 ENCOUNTER — Other Ambulatory Visit: Payer: Self-pay

## 2015-07-15 MED ORDER — AMLODIPINE BESYLATE 5 MG PO TABS: 5.0000 mg | ORAL_TABLET | Freq: Every day | ORAL | Status: DC

## 2015-07-17 ENCOUNTER — Ambulatory Visit (INDEPENDENT_AMBULATORY_CARE_PROVIDER_SITE_OTHER): Payer: Medicare Other

## 2015-07-17 ENCOUNTER — Telehealth: Payer: Self-pay | Admitting: Internal Medicine

## 2015-07-17 ENCOUNTER — Telehealth: Payer: Self-pay

## 2015-07-17 VITALS — BP 140/90 | Ht 66.0 in | Wt 193.5 lb

## 2015-07-17 DIAGNOSIS — Z0000 Encounter for general adult medical examination without abnormal findings: Secondary | ICD-10-CM | POA: Diagnosis not present

## 2015-07-17 DIAGNOSIS — Z1231 Encounter for screening mammogram for malignant neoplasm of breast: Secondary | ICD-10-CM

## 2015-07-17 NOTE — Progress Notes (Addendum)
Subjective:   Valerie Jackson is a 69 y.o. female who presents for Medicare Annual (Subsequent) preventive examination.  Review of Systems:  HRA assessment completed during visit; Valerie Jackson, Valerie Jackson The Patient was informed that this wellness visit is to identify risk and educate on how to reduce risk for increase disease through lifestyle changes.   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 Still feels sob at times; asked about lung disease; feels sob may be related to weight;  HTN; Copd; CKD; hyperlipidemia Obesity (A1c 4.7; capillary 95)  Sickle cell / did not discuss today 04/03/14 colostomy take down 2dary diverticulitis;   Medical issues  BMI: 31.6  Diet; Dtr is health food nut; Struggle to have "normal" diet; Do a lot of canned foods; eats a lot of chicken  Ongoing struggle with dietary habits  Exercise; up and down a lot during the day Not a fan of exercise;    Psychosocial; moved from [place] 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  Developing a plan to develop her own interest and goal is to meet friends and bulid her support base back;  Identified need to talk to continue to grow and the need for space;  Best friends have died since she has been here; Discussed how grief impacts her anger and life;  Refusing counseling at this time; states it has never been helpful; the patient can state her needs and is committed to effort to rebuild support systems in [place] in process of thinking about moving;  Safety reviewed for the home; given infor on fall prevention but spent time letting the patient ventilate regarding her home circumstances.    Stressors; living with dtr who seems to be dependent on the patient financially.  Medication review/ compliant  Fall assessment" no falls Gait assessment/ no issues identified  Mobilization and Functional losses in the last year.no Sleep patterns  Urinary or fecal incontinence reviewed; since the surgery she can eat; but goes to bathroom after eating   Counseling: Hep C negative Colonoscopy; 01/31/2014; 5 to 10 years EKG: had EKG at hospital; surgery July 2015 Hearing: 4000hz  both ears Dexa- 07/03/2015;  score  -0.1 (lumbar) RFN:-1.0 LFN:-1.8  n/a     She should get plenty of weight-bearing activity and take Vit D 2000 IU per day and calcium 1200 mg per day Treatment; Vit d 2000u preferable Has access to weights;   Mammagram: 11/2011/will have mammogram; given information on providers  [facility] exam; [facility]; needs eye exam; also to be scheduled  Immunizations Due  Zostavax; States she never had the chicken pox PSV 23- declined today but will consider Flu- stopped taking the flu    Current Care Team reviewed and updated    Cardiac Risk Factors include: dyslipidemia;advanced age (>33mn, >>29women);hypertension     Objective:     Vitals: BP 140/90 mmHg  Ht 5' 6"  (1.676 m)  Wt 193 lb 8 oz (87.771 kg)  BMI 31.25 kg/m2  Tobacco History  Smoking status  . Former Smoker -- 0.25 packs/day for 50 years  . Types: Cigarettes  . Quit date: 09/06/2013  Smokeless tobacco  . Never  Used     Counseling given: Yes   Past Medical History  Diagnosis Date  . Hypertension   . Hypothyroid   . Depression   . Hypercholesteremia   . Sickle cell trait (Valerie Jackson)   . GERD (gastroesophageal reflux disease)   . History of blood transfusion 1965  . Diverticulitis 09/2013    with perforation  . Pericolonic abscess 2015  . Obesity   . Hypokalemia   . H/O hiatal hernia   . Arthritis     fingers  . Headache(784.0)     migraine every 5- to 6 years  . Blood clot in  vein 1968  . Hepatitis age 60 or 19    had heapatitis, not sure which kind  . Heart murmur since age 58    mild  . Diverticulitis of colon with perforation 09/28/2013   Past Surgical History  Procedure Laterality Date  . Cholecystectomy  2002  . Tonsillectomy  1952  . Colon resection N/A 10/11/2013    Procedure: EXPLORATORY LAPAROTOMY WITH RECTOSIGMOID COLECTOMY AND END COLOSTOMY;  Surgeon: Stark Klein, MD;  Location: [facility];  Service: General;  Laterality: N/A;  . Colostomy takedown N/A 02/27/2014    Procedure: COLOSTOMY TAKEDOWN Valerie Jackson HERNIA REPAIR;  Surgeon: Stark Klein, MD;  Location: [facility];  Service: General;  Laterality: N/A;   Family History  Problem Relation Age of Onset  . Hypertension Mother   . Aneurysm Mother   . Cancer Mother     Uterine  . Thyroid cancer Mother   . Cancer Other   . Thyroid cancer Sister   . Arthritis Sister   . Hyperlipidemia Sister   . Hypertension Sister   . Stroke Sister   . Kidney disease Sister   . Hyperlipidemia Brother   . Thyroid cancer Daughter   . Breast cancer Maternal Grandmother   . Hypertension Maternal Grandmother    History  Sexual Activity  . Sexual Activity: Not on file    Outpatient Encounter Prescriptions as of 07/17/2015  Medication Sig  . Aclidinium Bromide (TUDORZA PRESSAIR) 400 MCG/ACT AEPB Inhale 1 puff into the lungs 2 (two) times daily.  [name] amLODipine (NORVASC) 5 MG tablet Take 1 tablet (5 mg total) by mouth daily.  [name] levothyroxine (SYNTHROID, LEVOTHROID) 112 MCG tablet Take 1 tablet (112 mcg total) by mouth daily.  [name] MAGNESIUM PO Take 1 tablet by mouth daily.  [name] POTASSIUM PO Take 1 tablet by mouth daily.  [name] zolpidem (AMBIEN) 5 MG tablet Take 1 tablet (5 mg total) by mouth at bedtime as needed for sleep.   No facility-administered encounter medications on file as of 07/17/2015.    Activities of Daily Living In your present state of health, do you have any difficulty performing the following activities:  07/17/2015  Hearing? N  Vision? N  Difficulty concentrating or making decisions? (No Data)  Walking or climbing stairs? N  Dressing or bathing? N  Doing errands, shopping? N  Preparing Food and eating ? N  Using the Toilet? N  In the past six months, have you accidently leaked urine? N  Do you have problems with loss of bowel control? N  Managing your Medications? N  Managing your Finances? N    Patient Care Team: Janith Lima, MD as PCP - General (Internal Medicine)    Assessment:    Assessment   Patient presents for yearly preventative medicine examination. Medicare questionnaire screening were completed, i.e. Functional; fall risk; depression, memory loss and hearing.  Depression  reviewed; declines counseling; Does have a plan and seems to verbalize understanding of her plan to move and willingness to build support network locally. Lost 2 best friends recently; Identifies need to talk but has not one to talk to now; but refuses counseling. Denies SI;   All immunizations and health maintenance protocols were reviewed with the patient and needed orders were placed. Declined vaccines at present   Education provided for laboratory screens;  Hep c was completed and was neg States she is not in a relationship at the current time  Medication reconciliation, past medical history, social history, problem list and allergies were reviewed in detail with the patient  Goals were established   Would like a GYN referral, will outreach Dr. Ronnald Ramp;   End of life planning was discussed.    Exercise Activities and Dietary recommendations Current Exercise Habits:: Home exercise routine, Type of exercise: strength training/weights, Time (Minutes): 20, Frequency (Times/Week): 3, Weekly Exercise (Minutes/Week): 60, Intensity: Mild  Goals    . patient     More stress relief and balance; Getting out and meeting people and enjoying art;      Fall Risk Fall Risk  07/17/2015 05/23/2015  07/19/2013 02/27/2013  Falls in the past year? No No No No   Depression Screen PHQ 2/9 Scores 07/17/2015 05/23/2015 07/19/2013 02/27/2013  PHQ - 2 Score 2 2 0 0  PHQ- 9 Score 4 - - -     Cognitive Testing No flowsheet data found.  Immunization History  Administered Date(s) Administered  . Influenza, High Dose Seasonal PF 07/19/2013  . Pneumococcal Conjugate-13 07/19/2013  . Td 09/15/2011   Screening Tests Health Maintenance  Topic Date Due  . ZOSTAVAX  02/26/2005  . MAMMOGRAM  11/12/2013  . PNA vac Low Risk Adult (2 of 2 - PPSV23) 07/19/2014  . INFLUENZA VACCINE  04/15/2015  . TETANUS/TDAP  09/14/2021  . COLONOSCOPY  02/09/2024  . DEXA SCAN  Completed  . Hepatitis C Screening  Completed      Plan:   Declines zoster Declines flu and PSV 23   During the course of the visit the patient was educated and counseled about the following appropriate screening and preventive services:   Vaccines to include Pneumoccal, Influenza, Hepatitis B, Td, Zostavax, HCV  Electrocardiogram- completed when she had surgery July 2015  Cardiovascular Disease/ hx of being sob; thinks related to weight  Colorectal cancer screening-01/2014   Bone density screening- 06/2015 educated ; will start weight bearing exercises  Diabetes screening/ n/a  Glaucoma screening/ will schedule eye exam  Mammography/PAP - needs gyn will request referral from Dr. Ronnald Ramp  Nutrition counseling reviewed but emotional issues reviewed; current living situation is very stressful; placing needs of grand child ahead of her own.  Patient Instructions (the written plan) was given to the patient.   Wynetta Fines, RN  07/17/2015     Medical screening examination/treatment/procedure(s) were performed by non-physician practitioner and as supervising physician I was immediately available for consultation/collaboration. I agree with above. [name], MD

## 2015-07-17 NOTE — Telephone Encounter (Signed)
Done

## 2015-07-17 NOTE — Telephone Encounter (Signed)
Pt called to check on the amLODipine (NORVASC) 5 MG refill that was sent to [facility]. Pt stated she are trying to get in touch with [place] because they have a question (she is unsure what the question was). Please check on this.

## 2015-07-17 NOTE — Telephone Encounter (Signed)
Valerie Jackson is not coming in for CPE until Jan 2017; Would like GYN referral or recommendation?   Please advise Tks,

## 2015-07-17 NOTE — Patient Instructions (Addendum)
Ms. Cutler , Thank you for taking time to come for your Medicare Wellness Visit. I appreciate your ongoing commitment to your health goals. Please review the following plan we discussed and let me know if I can assist you in the future.   Will order mammogram for future; [facility]  Address: [address], [place], [place] [address]  Phone: [phone]  Shingles; has not had chickenpox in the past; will discuss with Dr. Ronnald Ramp Declines the PSV 23 and flu  Will start from weight bearing exercises due to osteopenia  Will need GYN referral and will ask Dr. Ronnald Ramp and get back to you  Will schedule eye exam   These are the goals we discussed: Goals    . patient     More stress relief and balance; Getting out and meeting people and enjoying art;       This is a list of the screening recommended for you and due dates:  Health Maintenance  Topic Date Due  . Shingles Vaccine  02/26/2005  . Mammogram  11/12/2013  . Pneumonia vaccines (2 of 2 - PPSV23) 07/19/2014  . Flu Shot  04/15/2015  . Tetanus Vaccine  09/14/2021  . Colon Cancer Screening  02/09/2024  . DEXA scan (bone density measurement)  Completed  .  Hepatitis C: One time screening is recommended by Center for Disease Control  (CDC) for  adults born from [birth year] through 1965.   Completed     Health Maintenance, Female Adopting a healthy lifestyle and getting preventive care can go a long way to promote health and wellness. Talk with your health care provider about what schedule of regular examinations is right for you. This is a good chance for you to check in with your provider about disease prevention and staying healthy. In between checkups, there are plenty of things you can do on your own. Experts have done a lot of research about which lifestyle changes and preventive measures are most likely to keep you healthy. Ask your health care provider for more information. WEIGHT AND DIET  Eat a healthy  diet  Be sure to include plenty of vegetables, fruits, low-fat dairy products, and lean protein.  Do not eat a lot of foods high in solid fats, added sugars, or salt.  Get regular exercise. This is one of the most important things you can do for your health.  Most adults should exercise for at least 150 minutes each week. The exercise should increase your heart rate and make you sweat (moderate-intensity exercise).  Most adults should also do strengthening exercises at least twice a week. This is in addition to the moderate-intensity exercise.  Maintain a healthy weight  Body mass index (BMI) is a measurement that can be used to identify possible weight problems. It estimates body fat based on height and weight. Your health care provider can help determine your BMI and help you achieve or maintain a healthy weight.  For females 33 years of age and older:   A BMI below 18.5 is considered underweight.  A BMI of 18.5 to 24.9 is normal.  A BMI of 25 to 29.9 is considered overweight.  A BMI of 30 and above is considered obese.  Watch levels of cholesterol and blood lipids  You should start having your blood tested for lipids and cholesterol at 70 years of age, then have this test every 5 years.  You may need to have your cholesterol levels checked more often  if:  Your lipid or cholesterol levels are high.  You are older than 70 years of age.  You are at high risk for heart disease.  CANCER SCREENING   Lung Cancer  Lung cancer screening is recommended for adults 55-80 years old who are at high risk for lung cancer because of a history of smoking.  A yearly low-dose CT scan of the lungs is recommended for people who:  Currently smoke.  Have quit within the past 15 years.  Have at least a 30-pack-year history of smoking. A pack year is smoking an average of one pack of cigarettes a day for 1 year.  Yearly screening should continue until it has been 15 years since you  quit.  Yearly screening should stop if you develop a health problem that would prevent you from having lung cancer treatment.  Breast Cancer  Practice breast self-awareness. This means understanding how your breasts normally appear and feel.  It also means doing regular breast self-exams. Let your health care provider know about any changes, no matter how small.  If you are in your 20s or 30s, you should have a clinical breast exam (CBE) by a health care provider every 1-3 years as part of a regular health exam.  If you are 40 or older, have a CBE every year. Also consider having a breast X-ray (mammogram) every year.  If you have a family history of breast cancer, talk to your health care provider about genetic screening.  If you are at high risk for breast cancer, talk to your health care provider about having an MRI and a mammogram every year.  Breast cancer gene (BRCA) assessment is recommended for women who have family members with BRCA-related cancers. BRCA-related cancers include:  Breast.  Ovarian.  Tubal.  Peritoneal cancers.  Results of the assessment will determine the need for genetic counseling and BRCA1 and BRCA2 testing. Cervical Cancer Your health care provider may recommend that you be screened regularly for cancer of the pelvic organs (ovaries, uterus, and vagina). This screening involves a pelvic examination, including checking for microscopic changes to the surface of your cervix (Pap test). You may be encouraged to have this screening done every 3 years, beginning at age 21.  For women ages 30-65, health care providers may recommend pelvic exams and Pap testing every 3 years, or they may recommend the Pap and pelvic exam, combined with testing for human papilloma virus (HPV), every 5 years. Some types of HPV increase your risk of cervical cancer. Testing for HPV may also be done on women of any age with unclear Pap test results.  Other health care providers may  not recommend any screening for nonpregnant women who are considered low risk for pelvic cancer and who do not have symptoms. Ask your health care provider if a screening pelvic exam is right for you.  If you have had past treatment for cervical cancer or a condition that could lead to cancer, you need Pap tests and screening for cancer for at least 20 years after your treatment. If Pap tests have been discontinued, your risk factors (such as having a new sexual partner) need to be reassessed to determine if screening should resume. Some women have medical problems that increase the chance of getting cervical cancer. In these cases, your health care provider may recommend more frequent screening and Pap tests. Colorectal Cancer  This type of cancer can be detected and often prevented.  Routine colorectal cancer screening usually begins at   70 years of age and continues through 70 years of age.  Your health care provider may recommend screening at an earlier age if you have risk factors for colon cancer.  Your health care provider may also recommend using home test kits to check for hidden blood in the stool.  A small camera at the end of a tube can be used to examine your colon directly (sigmoidoscopy or colonoscopy). This is done to check for the earliest forms of colorectal cancer.  Routine screening usually begins at age 50.  Direct examination of the colon should be repeated every 5-10 years through 70 years of age. However, you may need to be screened more often if early forms of precancerous polyps or small growths are found. Skin Cancer  Check your skin from head to toe regularly.  Tell your health care provider about any new moles or changes in moles, especially if there is a change in a mole's shape or color.  Also tell your health care provider if you have a mole that is larger than the size of a pencil eraser.  Always use sunscreen. Apply sunscreen liberally and repeatedly  throughout the day.  Protect yourself by wearing long sleeves, pants, a wide-brimmed hat, and sunglasses whenever you are outside. HEART DISEASE, DIABETES, AND HIGH BLOOD PRESSURE   High blood pressure causes heart disease and increases the risk of stroke. High blood pressure is more likely to develop in:  People who have blood pressure in the high end of the normal range (130-139/85-89 mm Hg).  People who are overweight or obese.  People who are African American.  If you are 18-39 years of age, have your blood pressure checked every 3-5 years. If you are 40 years of age or older, have your blood pressure checked every year. You should have your blood pressure measured twice--once when you are at a hospital or clinic, and once when you are not at a hospital or clinic. Record the average of the two measurements. To check your blood pressure when you are not at a hospital or clinic, you can use:  An automated blood pressure machine at a pharmacy.  A home blood pressure monitor.  If you are between 55 years and 79 years old, ask your health care provider if you should take aspirin to prevent strokes.  Have regular diabetes screenings. This involves taking a blood sample to check your fasting blood sugar level.  If you are at a normal weight and have a low risk for diabetes, have this test once every three years after 70 years of age.  If you are overweight and have a high risk for diabetes, consider being tested at a younger age or more often. PREVENTING INFECTION  Hepatitis B  If you have a higher risk for hepatitis B, you should be screened for this virus. You are considered at high risk for hepatitis B if:  You were born in a country where hepatitis B is common. Ask your health care provider which countries are considered high risk.  Your parents were born in a high-risk country, and you have not been immunized against hepatitis B (hepatitis B vaccine).  You have HIV or  AIDS.  You use needles to inject street drugs.  You live with someone who has hepatitis B.  You have had sex with someone who has hepatitis B.  You get hemodialysis treatment.  You take certain medicines for conditions, including cancer, organ transplantation, and autoimmune conditions. Hepatitis C    Blood testing is recommended for:  Everyone born from 1945 through 1965.  Anyone with known risk factors for hepatitis C. Sexually transmitted infections (STIs)  You should be screened for sexually transmitted infections (STIs) including gonorrhea and chlamydia if:  You are sexually active and are younger than 70 years of age.  You are older than 70 years of age and your health care provider tells you that you are at risk for this type of infection.  Your sexual activity has changed since you were last screened and you are at an increased risk for chlamydia or gonorrhea. Ask your health care provider if you are at risk.  If you do not have HIV, but are at risk, it may be recommended that you take a prescription medicine daily to prevent HIV infection. This is called pre-exposure prophylaxis (PrEP). You are considered at risk if:  You are sexually active and do not regularly use condoms or know the HIV status of your partner(s).  You take drugs by injection.  You are sexually active with a partner who has HIV. Talk with your health care provider about whether you are at high risk of being infected with HIV. If you choose to begin PrEP, you should first be tested for HIV. You should then be tested every 3 months for as long as you are taking PrEP.  PREGNANCY   If you are premenopausal and you may become pregnant, ask your health care provider about preconception counseling.  If you may become pregnant, take 400 to 800 micrograms (mcg) of folic acid every day.  If you want to prevent pregnancy, talk to your health care provider about birth control (contraception). OSTEOPOROSIS AND  MENOPAUSE   Osteoporosis is a disease in which the bones lose minerals and strength with aging. This can result in serious bone fractures. Your risk for osteoporosis can be identified using a bone density scan.  If you are 65 years of age or older, or if you are at risk for osteoporosis and fractures, ask your health care provider if you should be screened.  Ask your health care provider whether you should take a calcium or vitamin D supplement to lower your risk for osteoporosis.  Menopause may have certain physical symptoms and risks.  Hormone replacement therapy may reduce some of these symptoms and risks. Talk to your health care provider about whether hormone replacement therapy is right for you.  HOME CARE INSTRUCTIONS   Schedule regular health, dental, and eye exams.  Stay current with your immunizations.   Do not use any tobacco products including cigarettes, chewing tobacco, or electronic cigarettes.  If you are pregnant, do not drink alcohol.  If you are breastfeeding, limit how much and how often you drink alcohol.  Limit alcohol intake to no more than 1 drink per day for nonpregnant women. One drink equals 12 ounces of beer, 5 ounces of wine, or 1 ounces of hard liquor.  Do not use street drugs.  Do not share needles.  Ask your health care provider for help if you need support or information about quitting drugs.  Tell your health care provider if you often feel depressed.  Tell your health care provider if you have ever been abused or do not feel safe at home.   This information is not intended to replace advice given to you by your health care provider. Make sure you discuss any questions you have with your health care provider.   Document Released: 03/16/2011 Document Revised: 09/21/2014   Document Reviewed: 08/02/2013 Elsevier Interactive Patient Education 2016 [employer]  

## 2015-07-21 ENCOUNTER — Other Ambulatory Visit: Payer: Self-pay | Admitting: Internal Medicine

## 2015-07-21 DIAGNOSIS — Z124 Encounter for screening for malignant neoplasm of cervix: Secondary | ICD-10-CM | POA: Insufficient documentation

## 2015-07-21 NOTE — Telephone Encounter (Signed)
done

## 2015-08-06 ENCOUNTER — Ambulatory Visit
Admission: RE | Admit: 2015-08-06 | Discharge: 2015-08-06 | Disposition: A | Discharge status: Home / Self Care | Payer: Medicare Other | Source: Ambulatory Visit | Attending: Internal Medicine | Admitting: Internal Medicine

## 2015-08-06 DIAGNOSIS — Z1231 Encounter for screening mammogram for malignant neoplasm of breast: Secondary | ICD-10-CM | POA: Diagnosis not present

## 2015-08-12 DIAGNOSIS — H43812 Vitreous degeneration, left eye: Secondary | ICD-10-CM | POA: Diagnosis not present

## 2015-08-12 DIAGNOSIS — H2513 Age-related nuclear cataract, bilateral: Secondary | ICD-10-CM | POA: Diagnosis not present

## 2015-08-12 DIAGNOSIS — H5203 Hypermetropia, bilateral: Secondary | ICD-10-CM | POA: Diagnosis not present

## 2015-08-12 DIAGNOSIS — G43909 Migraine, unspecified, not intractable, without status migrainosus: Secondary | ICD-10-CM | POA: Diagnosis not present

## 2015-08-20 LAB — HM MAMMOGRAPHY

## 2015-08-23 ENCOUNTER — Other Ambulatory Visit (HOSPITAL_COMMUNITY)
Admission: RE | Admit: 2015-08-23 | Discharge: 2015-08-23 | Disposition: A | Discharge status: Home / Self Care | Payer: Medicare Other | Source: Ambulatory Visit | Attending: Gynecology | Admitting: Gynecology

## 2015-08-23 ENCOUNTER — Encounter: Payer: Self-pay | Admitting: Gynecology

## 2015-08-23 ENCOUNTER — Ambulatory Visit (INDEPENDENT_AMBULATORY_CARE_PROVIDER_SITE_OTHER): Payer: Medicare Other | Admitting: Gynecology

## 2015-08-23 VITALS — BP 130/88 | Ht 65.0 in | Wt 197.0 lb

## 2015-08-23 DIAGNOSIS — M8580 Other specified disorders of bone density and structure, unspecified site: Secondary | ICD-10-CM | POA: Insufficient documentation

## 2015-08-23 DIAGNOSIS — Z780 Asymptomatic menopausal state: Secondary | ICD-10-CM

## 2015-08-23 DIAGNOSIS — Z1151 Encounter for screening for human papillomavirus (HPV): Secondary | ICD-10-CM | POA: Insufficient documentation

## 2015-08-23 DIAGNOSIS — Z01419 Encounter for gynecological examination (general) (routine) without abnormal findings: Secondary | ICD-10-CM | POA: Insufficient documentation

## 2015-08-23 DIAGNOSIS — L304 Erythema intertrigo: Secondary | ICD-10-CM | POA: Diagnosis not present

## 2015-08-23 MED ORDER — CLOBETASOL PROPIONATE 0.05 % EX CREA: 1.0000 "application " | TOPICAL_CREAM | Freq: Two times a day (BID) | CUTANEOUS | Status: DC

## 2015-08-23 NOTE — Patient Instructions (Signed)
Intertrigo Intertrigo is a skin condition that occurs in between folds of skin in places on the body that rub together a lot and do not get much ventilation. It is caused by heat, moisture, friction, sweat retention, and lack of air circulation, which produces red, irritated patches and, sometimes, scaling or drainage. People who have diabetes, who are obese, or who have treatment with antibiotics are at increased risk for intertrigo. The most common sites for intertrigo to occur include:  The groin.  The breasts.  The armpits.  Folds of abdominal skin.  Webbed spaces between the fingers or toes. Intertrigo may be aggravated by:  Sweat.  Feces.  Yeast or bacteria that are present near skin folds.  Urine.  Vaginal discharge. HOME CARE INSTRUCTIONS  The following steps can be taken to reduce friction and keep the affected area cool and dry:  Expose skin folds to the air.  Keep deep skin folds separated with cotton or linen cloth. Avoid tight fitting clothing that could cause chafing.  Wear open-toed shoes or sandals to help reduce moisture between the toes.  Apply absorbent powders to affected areas as directed by your caregiver.  Apply over-the-counter barrier pastes, such as zinc oxide, as directed by your caregiver.  If you develop a fungal infection in the affected area, your caregiver may have you use antifungal creams. SEEK MEDICAL CARE IF:   The rash is not improving after 1 week of treatment.  The rash is getting worse (more red, more swollen, more painful, or spreading).  You have a fever or chills. MAKE SURE YOU:   Understand these instructions.  Will watch your condition.  Will get help right away if you are not doing well or get worse.   This information is not intended to replace advice given to you by your health care provider. Make sure you discuss any questions you have with your health care provider.   Document Released: 08/31/2005 Document  Revised: 11/23/2011 Document Reviewed: 03/04/2015 Elsevier Interactive Patient Education [employer].

## 2015-08-23 NOTE — Progress Notes (Signed)
Valerie Jackson [birth year] [id number]   History:    69 y.o.  for annual gyn exam who is a new patient to the practice. Her PCP is Dr. [name]. Patient has not had a gynecological exam in many years. She was previously being followed in [place]. She declined the flu vaccine today. She does state that many years ago while she had been on oral contraceptive pill she developed a DVT that many years later she was put on a low dose HRT because of severe vasomotor symptoms but discontinued it after one year. She is not sexually active. She denied any past history of any abnormal Pap smears. Patient with past history of benign colon polyps last colonoscopy in 2015. Patient with past history of colon resection and past history of colostomy as a result of ruptured diverticulum? Her last bone density study in 2016 demonstrated that the lowest T score was at the left femoral neck with a value of -1.8.  Past medical history,surgical history, family history and social history were all reviewed and documented in the EPIC chart.  Gynecologic History No LMP recorded. Patient is postmenopausal. Contraception: post menopausal status Last Pap: Many years ago. Results were: normal Last mammogram: 2016. Results were: normal  Obstetric History OB History  Gravida Para Term Preterm AB SAB TAB Ectopic Multiple Living  1 1        2     # Outcome Date GA Lbr Len/2nd Weight Sex Delivery Anes PTL Lv  1 Para     F Vag-Spont   Y       ROS: A ROS was performed and pertinent positives and negatives are included in the history.  GENERAL: No fevers or chills. HEENT: No change in vision, no earache, sore throat or sinus congestion. NECK: No pain or stiffness. CARDIOVASCULAR: No chest pain or pressure. No palpitations. PULMONARY: No shortness of breath, cough or wheeze. GASTROINTESTINAL: No abdominal pain, nausea, vomiting or diarrhea, melena or bright red blood per rectum. GENITOURINARY: No urinary frequency, urgency,  hesitancy or dysuria. MUSCULOSKELETAL: No joint or muscle pain, no back pain, no recent trauma. DERMATOLOGIC: No rash, no itching, no lesions. ENDOCRINE: No polyuria, polydipsia, no heat or cold intolerance. No recent change in weight. HEMATOLOGICAL: No anemia or easy bruising or bleeding. NEUROLOGIC: No headache, seizures, numbness, tingling or weakness. PSYCHIATRIC: No depression, no loss of interest in normal activity or change in sleep pattern.     Exam: chaperone present  BP 130/88 mmHg  Ht 5' 5"  (1.651 m)  Wt 197 lb (89.359 kg)  BMI 32.78 kg/m2  Body mass index is 32.78 kg/(m^2).  General appearance : Well developed well nourished female. No acute distress HEENT: Eyes: no retinal hemorrhage or exudates,  Neck supple, trachea midline, no carotid bruits, no thyroidmegaly Lungs: Clear to auscultation, no rhonchi or wheezes, or rib retractions  Heart: Regular rate and rhythm, no murmurs or gallops Breast:Examined in sitting and supine position were symmetrical in appearance, no palpable masses or tenderness,  no skin retraction, no nipple inversion, no nipple discharge, no skin discoloration, no axillary or supraclavicular lymphadenopathy Abdomen: no palpable masses or tenderness, no rebound or guarding Extremities: no edema or skin discoloration or tenderness  Pelvic:  Bartholin, Urethra, Skene Glands: Within normal limits             Vagina: No gross lesions or discharge, atrophic changes  Cervix: No gross lesions or discharge  Uterus  anteverted, normal size, shape and consistency, non-tender and  mobile  Adnexa  Without masses or tenderness  Anus and perineum  normal   Rectovaginal  normal sphincter tone without palpated masses or tenderness             Hemoccult PCP provides     Assessment/Plan:  69 y.o. female for annual exam with history of osteopenia. Prior history of DVT many years ago on oral contraceptive pill. Patient currently on no hormone replacement therapy.  Patient's PCP has been doing her blood work. Review of patient's records indicated all her vaccines are up-to-date. Since we do not have any records of her last Pap smear we will do one with an HPV screen today then adhere to the new guidelines. We discussed importance of calcium vitamin D and regular exercise for osteoporosis prevention   Terrance Mass MD, 11:02 AM 08/23/2015

## 2015-08-26 LAB — CYTOLOGY - PAP

## 2015-08-29 ENCOUNTER — Encounter (HOSPITAL_COMMUNITY): Payer: Self-pay

## 2015-08-29 ENCOUNTER — Emergency Department (HOSPITAL_COMMUNITY)
Admission: EM | Admit: 2015-08-29 | Discharge: 2015-08-30 | Disposition: A | Discharge status: Home / Self Care | Payer: Medicare Other | Attending: Emergency Medicine | Admitting: Emergency Medicine

## 2015-08-29 DIAGNOSIS — R011 Cardiac murmur, unspecified: Secondary | ICD-10-CM | POA: Insufficient documentation

## 2015-08-29 DIAGNOSIS — Z79899 Other long term (current) drug therapy: Secondary | ICD-10-CM | POA: Insufficient documentation

## 2015-08-29 DIAGNOSIS — E039 Hypothyroidism, unspecified: Secondary | ICD-10-CM | POA: Diagnosis not present

## 2015-08-29 DIAGNOSIS — R112 Nausea with vomiting, unspecified: Secondary | ICD-10-CM | POA: Diagnosis not present

## 2015-08-29 DIAGNOSIS — I10 Essential (primary) hypertension: Secondary | ICD-10-CM | POA: Diagnosis not present

## 2015-08-29 DIAGNOSIS — K219 Gastro-esophageal reflux disease without esophagitis: Secondary | ICD-10-CM | POA: Insufficient documentation

## 2015-08-29 DIAGNOSIS — Z87891 Personal history of nicotine dependence: Secondary | ICD-10-CM | POA: Insufficient documentation

## 2015-08-29 DIAGNOSIS — E669 Obesity, unspecified: Secondary | ICD-10-CM | POA: Diagnosis not present

## 2015-08-29 DIAGNOSIS — R61 Generalized hyperhidrosis: Secondary | ICD-10-CM | POA: Insufficient documentation

## 2015-08-29 DIAGNOSIS — Z8739 Personal history of other diseases of the musculoskeletal system and connective tissue: Secondary | ICD-10-CM | POA: Diagnosis not present

## 2015-08-29 DIAGNOSIS — Z862 Personal history of diseases of the blood and blood-forming organs and certain disorders involving the immune mechanism: Secondary | ICD-10-CM | POA: Insufficient documentation

## 2015-08-29 DIAGNOSIS — G43909 Migraine, unspecified, not intractable, without status migrainosus: Secondary | ICD-10-CM | POA: Insufficient documentation

## 2015-08-29 DIAGNOSIS — R197 Diarrhea, unspecified: Secondary | ICD-10-CM | POA: Insufficient documentation

## 2015-08-29 DIAGNOSIS — Z8659 Personal history of other mental and behavioral disorders: Secondary | ICD-10-CM | POA: Insufficient documentation

## 2015-08-29 DIAGNOSIS — R404 Transient alteration of awareness: Secondary | ICD-10-CM | POA: Diagnosis not present

## 2015-08-29 DIAGNOSIS — R55 Syncope and collapse: Secondary | ICD-10-CM | POA: Diagnosis not present

## 2015-08-29 LAB — COMPREHENSIVE METABOLIC PANEL
ALT: 31 U/L (ref 14–54)
AST: 26 U/L (ref 15–41)
Albumin: 4.3 g/dL (ref 3.5–5.0)
Alkaline Phosphatase: 90 U/L (ref 38–126)
Anion gap: 10 (ref 5–15)
BUN: 15 mg/dL (ref 6–20)
CO2: 26 mmol/L (ref 22–32)
Calcium: 10 mg/dL (ref 8.9–10.3)
Chloride: 106 mmol/L (ref 101–111)
Creatinine, Ser: 1.37 mg/dL — ABNORMAL HIGH (ref 0.44–1.00)
GFR calc Af Amer: 44 mL/min — ABNORMAL LOW (ref >60)
GFR calc non Af Amer: 38 mL/min — ABNORMAL LOW (ref >60)
Glucose, Bld: 106 mg/dL — ABNORMAL HIGH (ref 65–99)
Potassium: 3.5 mmol/L (ref 3.5–5.1)
Sodium: 142 mmol/L (ref 135–145)
Total Bilirubin: 0.8 mg/dL (ref 0.3–1.2)
Total Protein: 7.9 g/dL (ref 6.5–8.1)

## 2015-08-29 LAB — CBC WITH DIFFERENTIAL/PLATELET
Basophils Absolute: 0.1 10*3/uL (ref 0.0–0.1)
Basophils Relative: 0 %
Eosinophils Absolute: 0.2 10*3/uL (ref 0.0–0.7)
Eosinophils Relative: 2 %
HCT: 39.1 % (ref 36.0–46.0)
Hemoglobin: 13 g/dL (ref 12.0–15.0)
Lymphocytes Relative: 13 %
Lymphs Abs: 1.6 10*3/uL (ref 0.7–4.0)
MCH: 29.7 pg (ref 26.0–34.0)
MCHC: 33.2 g/dL (ref 30.0–36.0)
MCV: 89.5 fL (ref 78.0–100.0)
Monocytes Absolute: 0.5 10*3/uL (ref 0.1–1.0)
Monocytes Relative: 4 %
Neutro Abs: 9.8 10*3/uL — ABNORMAL HIGH (ref 1.7–7.7)
Neutrophils Relative %: 81 %
Platelets: 210 10*3/uL (ref 150–400)
RBC: 4.37 MIL/uL (ref 3.87–5.11)
RDW: 13 % (ref 11.5–15.5)
WBC: 12.2 10*3/uL — ABNORMAL HIGH (ref 4.0–10.5)

## 2015-08-29 LAB — CBG MONITORING, ED: Glucose-Capillary: 89 mg/dL (ref 65–99)

## 2015-08-29 MED ORDER — ONDANSETRON 8 MG PO TBDP
8.0000 mg | ORAL_TABLET | Freq: Once | ORAL | Status: AC
  Administered 2015-08-29: 8 mg via ORAL
  Filled 2015-08-29: qty 1

## 2015-08-29 MED ORDER — SODIUM CHLORIDE 0.9 % IV BOLUS (SEPSIS)
1000.0000 mL | Freq: Once | INTRAVENOUS | Status: AC
  Administered 2015-08-29: 1000 mL via INTRAVENOUS

## 2015-08-29 NOTE — ED Notes (Signed)
PT RECEIVED VIA EMS C/O NAUSEA ALL DAY. PT STATES WHILE HAVING A BM TONIGHT, THE PT BEGAN TO FEEL MORE NAUSEOUS,DIZZY, VOMITED,A ND THEN SHE "PASSED OUT". DENIES TRAUMA FROM THE FALL.

## 2015-08-29 NOTE — ED Provider Notes (Signed)
69 year old female who had a syncopal episode just prior to arrival, according to family members she was unresponsive for about a minute, this occurred while she was on the commode, she said she had been feeling nauseated, then became diaphoretic and lightheaded, she fell forward onto the ground, she did not strike her head, the patient states that she remembers everything but the family member state that she was nonresponsive for approximately 1 minute, then she was slowly came around over 10 minutes. She has no complaints at this time. On exam she has no signs of head trauma, clear heart and lung sounds, soft abdomen, normal neurologic function, she appears well, her EKG shows no signs of ischemia, the patient will get IV fluids and repeat exam. Stable appearing, anticipate discharge.   EKG Interpretation  Date/Time:  Thursday August 29 2015 22:24:58 EST Ventricular Rate:  74 PR Interval:  162 QRS Duration: 99 QT Interval:  389 QTC Calculation: 432 R Axis:   -16 Text Interpretation:  Sinus rhythm Borderline left axis deviation RSR' in V1 or V2, right VCD or RVH since last tracing no significant change Confirmed by Zahari Fazzino  MD, Jerame Hedding ([id number]) on 08/29/2015 10:40:55 PM      Medical screening examination/treatment/procedure(s) were conducted as a shared visit with non-physician practitioner(s) and myself.  I personally evaluated the patient during the encounter.  Clinical Impression:   Final diagnoses:  Nausea vomiting and diarrhea  Vasovagal syncope         Noemi Chapel, MD 09/01/15 [id number]

## 2015-08-29 NOTE — ED Notes (Signed)
Bed: RK47 Expected date:  Expected time:  Means of arrival:  Comments: EMS 69 yo female from home N/V/D, syncopal episode with BM earlier

## 2015-08-30 MED ORDER — ONDANSETRON HCL 4 MG PO TABS: 4.0000 mg | ORAL_TABLET | Freq: Four times a day (QID) | ORAL | Status: DC

## 2015-08-30 NOTE — Discharge Instructions (Signed)
Syncope, commonly known as fainting, is a temporary loss of consciousness. It occurs when the blood flow to the brain is reduced. Vasovagal syncope (also called neurocardiogenic syncope) is a fainting spell in which the blood flow to the brain is reduced because of a sudden drop in heart rate and blood pressure. Vasovagal syncope occurs when the brain and the cardiovascular system (blood vessels) do not adequately communicate and respond to each other. This is the most common cause of fainting. It often occurs in response to fear or some other type of emotional or physical stress. The body has a reaction in which the heart starts beating too slowly or the blood vessels expand, reducing blood pressure. This type of fainting spell is generally considered harmless. However, injuries can occur if a person takes a sudden fall during a fainting spell.  CAUSES  Vasovagal syncope occurs when a person's blood pressure and heart rate decrease suddenly, usually in response to a trigger. Many things and situations can trigger an episode. Some of these include:   Pain.   Fear.   The sight of blood or medical procedures, such as blood being drawn from a vein.   Common activities, such as coughing, swallowing, stretching, or going to the bathroom.   Emotional stress.   Prolonged standing, especially in a warm environment.   Lack of sleep or rest.   Prolonged lack of food.   Prolonged lack of fluids.   Recent illness.  The use of certain drugs that affect blood pressure, such as cocaine, alcohol, marijuana, inhalants, and opiates.  SYMPTOMS  Before the fainting episode, you may:   Feel dizzy or light headed.   Become pale.  Sense that you are going to faint.   Feel like the room is spinning.   Have tunnel vision, only seeing directly in front of you.   Feel sick to your stomach (nauseous).   See spots or slowly lose vision.   Hear ringing in your ears.   Have a headache.    Feel warm and sweaty.   Feel a sensation of pins and needles. During the fainting spell, you will generally be unconscious for no longer than a couple minutes before waking up and returning to normal. If you get up too quickly before your body can recover, you may faint again. Some twitching or jerky movements may occur during the fainting spell.  DIAGNOSIS  Your health care provider will ask about your symptoms, take a medical history, and perform a physical exam. Various tests may be done to rule out other causes of fainting. These may include blood tests and tests to check the heart, such as electrocardiography, echocardiography, and possibly an electrophysiology study. When other causes have been ruled out, a test may be done to check the body's response to changes in position (tilt table test). TREATMENT  Most cases of vasovagal syncope do not require treatment. Your health care provider may recommend ways to avoid fainting triggers and may provide home strategies for preventing fainting. If you must be exposed to a possible trigger, you can drink additional fluids to help reduce your chances of having an episode of vasovagal syncope. If you have warning signs of an oncoming episode, you can respond by positioning yourself favorably (lying down). If your fainting spells continue, you may be given medicines to prevent fainting. Some medicines may help make you more resistant to repeated episodes of vasovagal syncope. Special exercises or compression stockings may be recommended. In rare cases, the surgical  placement of a pacemaker is considered. HOME CARE INSTRUCTIONS   Learn to identify the warning signs of vasovagal syncope.   Sit or lie down at the first warning sign of a fainting spell. If sitting, put your head down between your legs. If you lie down, swing your legs up in the air to increase blood flow to the brain.   Avoid hot tubs and saunas.  Avoid prolonged standing.  Drink  enough fluids to keep your urine clear or pale yellow. Avoid caffeine.  Increase salt in your diet as directed by your health care provider.   If you have to stand for a long time, perform movements such as:   Crossing your legs.   Flexing and stretching your leg muscles.   Squatting.   Moving your legs.   Bending over.   Only take over-the-counter or prescription medicines as directed by your health care provider. Do not suddenly stop any medicines without asking your health care provider first. [place] IF:   Your fainting spells continue or happen more frequently in spite of treatment.   You lose consciousness for more than a couple minutes.  You have fainting spells during or after exercising or after being startled.   You have new symptoms that occur with the fainting spells, such as:   Shortness of breath.  Chest pain.   Irregular heartbeat.   You have episodes of twitching or jerky movements that last longer than a few seconds.  You have episodes of twitching or jerky movements without obvious fainting. SEEK IMMEDIATE MEDICAL CARE IF:   You have injuries or bleeding after a fainting spell.   You have episodes of twitching or jerky movements that last longer than 5 minutes.   You have more than one spell of twitching or jerky movements before returning to consciousness after fainting.   This information is not intended to replace advice given to you by your health care provider. Make sure you discuss any questions you have with your health care provider.   Document Released: 08/17/2012 Document Revised: 01/15/2015 Document Reviewed: 08/17/2012 Elsevier Interactive Patient Education 2016 [employer] Nausea and Vomiting Nausea is a sick feeling that often comes before throwing up (vomiting). Vomiting is a reflex where stomach contents come out of your mouth. Vomiting can cause severe loss of body fluids (dehydration). Children and  elderly adults can become dehydrated quickly, especially if they also have diarrhea. Nausea and vomiting are symptoms of a condition or disease. It is important to find the cause of your symptoms. CAUSES   Direct irritation of the stomach lining. This irritation can result from increased acid production (gastroesophageal reflux disease), infection, food poisoning, taking certain medicines (such as nonsteroidal anti-inflammatory drugs), alcohol use, or tobacco use.  Signals from the brain.These signals could be caused by a headache, heat exposure, an inner ear disturbance, increased pressure in the brain from injury, infection, a tumor, or a concussion, pain, emotional stimulus, or metabolic problems.  An obstruction in the gastrointestinal tract (bowel obstruction).  Illnesses such as diabetes, hepatitis, gallbladder problems, appendicitis, kidney problems, cancer, sepsis, atypical symptoms of a heart attack, or eating disorders.  Medical treatments such as chemotherapy and radiation.  Receiving medicine that makes you sleep (general anesthetic) during surgery. DIAGNOSIS Your caregiver may ask for tests to be done if the problems do not improve after a few days. Tests may also be done if symptoms are severe or if the reason for the nausea and vomiting is not  clear. Tests may include:  Urine tests.  Blood tests.  Stool tests.  Cultures (to look for evidence of infection).  X-rays or other imaging studies. Test results can help your caregiver make decisions about treatment or the need for additional tests. TREATMENT You need to stay well hydrated. Drink frequently but in small amounts.You may wish to drink water, sports drinks, clear broth, or eat frozen ice pops or gelatin dessert to help stay hydrated.When you eat, eating slowly may help prevent nausea.There are also some antinausea medicines that may help prevent nausea. HOME CARE INSTRUCTIONS   Take all medicine as directed by  your caregiver.  If you do not have an appetite, do not force yourself to eat. However, you must continue to drink fluids.  If you have an appetite, eat a normal diet unless your caregiver tells you differently.  Eat a variety of complex carbohydrates (rice, wheat, potatoes, bread), lean meats, yogurt, fruits, and vegetables.  Avoid high-fat foods because they are more difficult to digest.  Drink enough water and fluids to keep your urine clear or pale yellow.  If you are dehydrated, ask your caregiver for specific rehydration instructions. Signs of dehydration may include:  Severe thirst.  Dry lips and mouth.  Dizziness.  Dark urine.  Decreasing urine frequency and amount.  Confusion.  Rapid breathing or pulse. SEEK IMMEDIATE MEDICAL CARE IF:   You have blood or brown flecks (like coffee grounds) in your vomit.  You have black or bloody stools.  You have a severe headache or stiff neck.  You are confused.  You have severe abdominal pain.  You have chest pain or trouble breathing.  You do not urinate at least once every 8 hours.  You develop cold or clammy skin.  You continue to vomit for longer than 24 to 48 hours.  You have a fever. MAKE SURE YOU:   Understand these instructions.  Will watch your condition.  Will get help right away if you are not doing well or get worse.   This information is not intended to replace advice given to you by your health care provider. Make sure you discuss any questions you have with your health care provider.   Document Released: 08/31/2005 Document Revised: 11/23/2011 Document Reviewed: 01/28/2011 Elsevier Interactive Patient Education [employer].

## 2015-08-30 NOTE — ED Provider Notes (Signed)
CSN: [id number]     Arrival date & time 08/29/15  2111 History   First MD Initiated Contact with Patient 08/29/15 2136     Chief Complaint  Patient presents with  . Nausea    ALL DAY  . Near Syncope    WHILE HAVING A BM TONIGHT     (Consider location/radiation/quality/duration/timing/severity/associated sxs/prior Treatment) Patient is a 69 y.o. female presenting with near-syncope. The history is provided by the patient and a relative. No language interpreter was used.  Near Syncope This is a new problem. The current episode started today. Associated symptoms include diaphoresis, nausea and vomiting. Pertinent negatives include no abdominal pain, chest pain, chills, fever, headaches or myalgias. Associated symptoms comments: Patient with nausea, vomiting, multiple soft bowel movements with episode of syncope while in the bathroom. No injury from fall from sitting position. Per daughter, LOC was brief. She denies abdominal pain, fever, hematemesis, bloody bowel movements or melena. No chest pain, SOB. She reports she is feeling much better now. .    Past Medical History  Diagnosis Date  . Hypertension   . Hypothyroid   . Depression   . Hypercholesteremia   . Sickle cell trait ([place])   . GERD (gastroesophageal reflux disease)   . History of blood transfusion 1965  . Diverticulitis 09/2013    with perforation  . Pericolonic abscess 2015  . Obesity   . Hypokalemia   . H/O hiatal hernia   . Arthritis     fingers  . Headache(784.0)     migraine every 5- to 6 years  . Blood clot in vein 1968  . Hepatitis age 73 or 63    had heapatitis, not sure which kind  . Heart murmur since age 28    mild  . Diverticulitis of colon with perforation 09/28/2013   Past Surgical History  Procedure Laterality Date  . Cholecystectomy  2002  . Tonsillectomy  1952  . Colon resection N/A 10/11/2013    Procedure: EXPLORATORY LAPAROTOMY WITH RECTOSIGMOID COLECTOMY AND END COLOSTOMY;  Surgeon: Stark Klein, MD;  Location: [facility];  Service: General;  Laterality: N/A;  . Colostomy takedown N/A 02/27/2014    Procedure: COLOSTOMY TAKEDOWN [name] HERNIA REPAIR;  Surgeon: Stark Klein, MD;  Location: [facility];  Service: General;  Laterality: N/A;   Family History  Problem Relation Age of Onset  . Hypertension Mother   . Aneurysm Mother   . Cancer Mother     Uterine  . Thyroid cancer Mother   . Cancer Other   . Thyroid cancer Sister   . Arthritis Sister   . Hyperlipidemia Sister   . Hypertension Sister   . Stroke Sister   . Kidney disease Sister   . Hyperlipidemia Brother   . Thyroid cancer Daughter   . Breast cancer Maternal Grandmother   . Hypertension Maternal Grandmother    Social History  Substance Use Topics  . Smoking status: Former Smoker -- 0.25 packs/day for 50 years    Types: Cigarettes    Quit date: 09/06/2013  . Smokeless tobacco: Never Used  . Alcohol Use: No   OB History    Gravida Para Term Preterm AB TAB SAB Ectopic Multiple Living   1 1        2      Review of Systems  Constitutional: Positive for diaphoresis. Negative for fever and chills.  Respiratory: Negative.  Negative for shortness of breath.   Cardiovascular: Positive for near-syncope. Negative for chest pain.  Gastrointestinal: Positive  for nausea, vomiting and diarrhea. Negative for abdominal pain and blood in stool.  Musculoskeletal: Negative.  Negative for myalgias.  Skin: Negative.   Neurological: Positive for syncope. Negative for headaches.      Allergies  Estrogens  Home Medications   Prior to Admission medications   Medication Sig Start Date End Date Taking? Authorizing Provider  amLODipine (NORVASC) 5 MG tablet Take 1 tablet (5 mg total) by mouth daily. 07/15/15  Yes Janith Lima, MD  calcium carbonate (OS-CAL - DOSED IN MG OF ELEMENTAL CALCIUM) 1250 (500 CA) MG tablet Take 1 tablet by mouth daily with breakfast.   Yes Historical Provider, MD  cholecalciferol (VITAMIN D)  1000 UNITS tablet Take 1,000 Units by mouth daily.   Yes Historical Provider, MD  clobetasol cream (TEMOVATE) 6.19 % Apply 1 application topically 2 (two) times daily. Apply BID for one week then once weekly 08/23/15  Yes Terrance Mass, MD  levothyroxine (SYNTHROID, LEVOTHROID) 112 MCG tablet Take 1 tablet (112 mcg total) by mouth daily. 04/19/15  Yes Janith Lima, MD  MAGNESIUM PO Take 1 tablet by mouth daily.   Yes Historical Provider, MD  POTASSIUM PO Take 1 tablet by mouth daily.   Yes Historical Provider, MD  zolpidem (AMBIEN) 5 MG tablet Take 1 tablet (5 mg total) by mouth at bedtime as needed for sleep. 01/17/15  Yes Janith Lima, MD  Aclidinium Bromide (TUDORZA PRESSAIR) 400 MCG/ACT AEPB Inhale 1 puff into the lungs 2 (two) times daily. Patient not taking: Reported on 08/23/2015 05/23/15   Janith Lima, MD   BP 121/79 mmHg  Pulse 70  Temp(Src) 97.7 F (36.5 C) (Oral)  Resp 11  Ht 5' 5"  (1.651 m)  Wt 88.451 kg  BMI 32.45 kg/m2  SpO2 96% Physical Exam  Constitutional: She is oriented to person, place, and time. She appears well-developed and well-nourished.  HENT:  Head: Normocephalic.  Neck: Normal range of motion. Neck supple.  Cardiovascular: Normal rate and regular rhythm.   Pulmonary/Chest: Effort normal and breath sounds normal.  Abdominal: Soft. Bowel sounds are normal. There is no tenderness. There is no rebound and no guarding.  Musculoskeletal: Normal range of motion. She exhibits no edema.  Neurological: She is alert and oriented to person, place, and time.  Skin: Skin is warm and dry. No rash noted.  Psychiatric: She has a normal mood and affect.    ED Course  Procedures (including critical care time) Labs Review Labs Reviewed  COMPREHENSIVE METABOLIC PANEL - Abnormal; Notable for the following:    Glucose, Bld 106 (*)    Creatinine, Ser 1.37 (*)    GFR calc non Af Amer 38 (*)    GFR calc Af Amer 44 (*)    All other components within normal limits  CBC  WITH DIFFERENTIAL/PLATELET - Abnormal; Notable for the following:    WBC 12.2 (*)    Neutro Abs 9.8 (*)    All other components within normal limits  CBG MONITORING, ED    Imaging Review No results found. I have personally reviewed and evaluated these images and lab results as part of my medical decision-making.   EKG Interpretation   Date/Time:  Thursday August 29 2015 22:24:58 EST Ventricular Rate:  74 PR Interval:  162 QRS Duration: 99 QT Interval:  389 QTC Calculation: 432 R Axis:   -16 Text Interpretation:  Sinus rhythm Borderline left axis deviation RSR' in  V1 or V2, right VCD or RVH since last tracing  no significant change  Confirmed by Sabra Heck  MD, [place] ([id number]) on 08/29/2015 10:40:55 PM      MDM   Final diagnoses:  None    1. Nausea, vomiting, diarrhea 2. Vasovagal syncope  The patient syncopized while in the bathroom for vomiting and bowel movement. No chest pain. Non-ischemic EKG. Likely vasovagal episode. She is feeling much better after 1 L fluids, labs are reassuring. She is tolerating PO fluids without pain, nausea or further vomiting. Ambulatory without dizziness, nausea. Stable for discharge home.     Charlann Lange, PA-C 08/30/15 6045  Noemi Chapel, MD 09/01/15 [phone]

## 2015-09-12 ENCOUNTER — Encounter: Payer: Self-pay | Admitting: *Deleted

## 2015-09-25 ENCOUNTER — Encounter: Payer: Self-pay | Admitting: Internal Medicine

## 2015-09-25 ENCOUNTER — Other Ambulatory Visit (INDEPENDENT_AMBULATORY_CARE_PROVIDER_SITE_OTHER): Payer: Medicare Other

## 2015-09-25 ENCOUNTER — Ambulatory Visit (INDEPENDENT_AMBULATORY_CARE_PROVIDER_SITE_OTHER): Payer: Medicare Other | Admitting: Internal Medicine

## 2015-09-25 VITALS — BP 128/80 | HR 80 | Temp 97.8°F | Resp 16 | Ht 65.0 in | Wt 204.0 lb

## 2015-09-25 DIAGNOSIS — I10 Essential (primary) hypertension: Secondary | ICD-10-CM

## 2015-09-25 DIAGNOSIS — N182 Chronic kidney disease, stage 2 (mild): Secondary | ICD-10-CM | POA: Diagnosis not present

## 2015-09-25 DIAGNOSIS — E038 Other specified hypothyroidism: Secondary | ICD-10-CM

## 2015-09-25 DIAGNOSIS — Z23 Encounter for immunization: Secondary | ICD-10-CM | POA: Diagnosis not present

## 2015-09-25 LAB — BASIC METABOLIC PANEL
BUN: 16 mg/dL (ref 6–23)
CO2: 28 mEq/L (ref 19–32)
Calcium: 10.3 mg/dL (ref 8.4–10.5)
Chloride: 104 mEq/L (ref 96–112)
Creatinine, Ser: 1.45 mg/dL — ABNORMAL HIGH (ref 0.40–1.20)
GFR: 45.84 mL/min — ABNORMAL LOW (ref >60.00)
Glucose, Bld: 109 mg/dL — ABNORMAL HIGH (ref 70–99)
Potassium: 3.7 mEq/L (ref 3.5–5.1)
Sodium: 140 mEq/L (ref 135–145)

## 2015-09-25 LAB — TSH: TSH: 1.79 u[IU]/mL (ref 0.35–4.50)

## 2015-09-25 NOTE — Progress Notes (Signed)
Subjective:  Patient ID: Valerie Jackson, female    DOB: 11/28/1945  Age: 70 y.o. MRN: [id number]  CC: Hypertension and Hypothyroidism   HPI [place] Privett presents for routine follow-up, she feels well and offers no complaints.  Outpatient Prescriptions Prior to Visit  Medication Sig Dispense Refill  . Aclidinium Bromide (TUDORZA PRESSAIR) 400 MCG/ACT AEPB Inhale 1 puff into the lungs 2 (two) times daily. 3 each 3  . calcium carbonate (OS-CAL - DOSED IN MG OF ELEMENTAL CALCIUM) 1250 (500 CA) MG tablet Take 1 tablet by mouth daily with breakfast.    . cholecalciferol (VITAMIN D) 1000 UNITS tablet Take 1,000 Units by mouth daily.    . clobetasol cream (TEMOVATE) 6.43 % Apply 1 application topically 2 (two) times daily. Apply BID for one week then once weekly 30 g 2  . levothyroxine (SYNTHROID, LEVOTHROID) 112 MCG tablet Take 1 tablet (112 mcg total) by mouth daily. 90 tablet 3  . MAGNESIUM PO Take 1 tablet by mouth daily.    [name] POTASSIUM PO Take 1 tablet by mouth daily.    [name] zolpidem (AMBIEN) 5 MG tablet Take 1 tablet (5 mg total) by mouth at bedtime as needed for sleep. 30 tablet 5  . amLODipine (NORVASC) 5 MG tablet Take 1 tablet (5 mg total) by mouth daily. 90 tablet 1  . ondansetron (ZOFRAN) 4 MG tablet Take 1 tablet (4 mg total) by mouth every 6 (six) hours. 12 tablet 0   No facility-administered medications prior to visit.    ROS Review of Systems  Constitutional: Negative.  Negative for fever, chills, diaphoresis, appetite change and fatigue.  HENT: Negative.   Eyes: Negative.   Respiratory: Negative.  Negative for cough, choking, chest tightness, shortness of breath and stridor.   Cardiovascular: Negative.  Negative for chest pain, palpitations and leg swelling.  Gastrointestinal: Negative.  Negative for nausea, vomiting, abdominal pain, diarrhea, constipation and blood in stool.  Endocrine: Negative.   Genitourinary: Negative.   Musculoskeletal: Negative.  Negative for  myalgias, back pain and arthralgias.  Skin: Negative.  Negative for color change and rash.  Allergic/Immunologic: Negative.   Neurological: Negative.   Hematological: Negative.  Negative for adenopathy. Does not bruise/bleed easily.  Psychiatric/Behavioral: Negative.  Negative for sleep disturbance and decreased concentration. The patient is not nervous/anxious.     Objective:  BP 128/80 mmHg  Pulse 80  Temp(Src) 97.8 F (36.6 C) (Oral)  Resp 16  Ht 5' 5"  (1.651 m)  Wt 204 lb (92.534 kg)  BMI 33.95 kg/m2  SpO2 99%  BP Readings from Last 3 Encounters:  09/25/15 128/80  08/30/15 121/79  08/23/15 130/88    Wt Readings from Last 3 Encounters:  09/25/15 204 lb (92.534 kg)  08/29/15 195 lb (88.451 kg)  08/23/15 197 lb (89.359 kg)    Physical Exam  Constitutional: She is oriented to person, place, and time. No distress.  HENT:  Mouth/Throat: Oropharynx is clear and moist. No oropharyngeal exudate.  Eyes: Conjunctivae are normal. Right eye exhibits no discharge. Left eye exhibits no discharge. No scleral icterus.  Neck: Normal range of motion. Neck supple. No JVD present. No tracheal deviation present. No thyromegaly present.  Cardiovascular: Normal rate, regular rhythm, normal heart sounds and intact distal pulses.  Exam reveals no gallop and no friction rub.   No murmur heard. Pulmonary/Chest: Effort normal and breath sounds normal. No stridor. No respiratory distress. She has no wheezes. She has no rales. She exhibits no tenderness.  Abdominal:  Soft. Bowel sounds are normal. She exhibits no distension and no mass. There is no tenderness. There is no rebound and no guarding.  Musculoskeletal: Normal range of motion. She exhibits no edema or tenderness.  Lymphadenopathy:    She has no cervical adenopathy.  Neurological: She is oriented to person, place, and time.  Skin: Skin is warm and dry. No rash noted. She is not diaphoretic. No erythema. No pallor.  Vitals  reviewed.   Lab Results  Component Value Date   WBC 12.2* 08/29/2015   HGB 13.0 08/29/2015   HCT 39.1 08/29/2015   PLT 210 08/29/2015   GLUCOSE 109* 09/25/2015   CHOL 219* 05/23/2015   TRIG 126.0 05/23/2015   HDL 46.00 05/23/2015   LDLDIRECT 226.0 02/27/2013   LDLCALC 147* 05/23/2015   ALT 31 08/29/2015   AST 26 08/29/2015   NA 140 09/25/2015   K 3.7 09/25/2015   CL 104 09/25/2015   CREATININE 1.45* 09/25/2015   BUN 16 09/25/2015   CO2 28 09/25/2015   TSH 1.79 09/25/2015   INR 1.04 02/21/2014   HGBA1C 4.7 05/14/2014    No results found.  Assessment & Plan:   [name] was seen today for hypertension and hypothyroidism.  Diagnoses and all orders for this visit:  Need for 23-polyvalent pneumococcal polysaccharide vaccine -     Pneumococcal polysaccharide vaccine 23-valent greater than or equal to 2yo subcutaneous/IM  Essential hypertension- her blood pressure is well-controlled, electrolytes and renal function are stable. -     Basic metabolic panel; Future  Other specified hypothyroidism- her TSH is in the normal range, she will remain on the current dose of Synthroid. -     TSH; Future  CKD (chronic kidney disease) stage 2, GFR 60-89 ml/min- her renal function is stable, she will continue to avoid nephrotoxic agents, I will continue to work for good blood pressure control. -     Basic metabolic panel; Future   I have discontinued Ms. Tortorelli amLODipine and ondansetron. I am also having her maintain her zolpidem, levothyroxine, Aclidinium Bromide, POTASSIUM PO, MAGNESIUM PO, clobetasol cream, cholecalciferol, and calcium carbonate.  No orders of the defined types were placed in this encounter.     Follow-up: Return in about 6 months (around 03/24/2016).  [name], MD

## 2015-09-25 NOTE — Patient Instructions (Signed)

## 2015-09-25 NOTE — Progress Notes (Signed)
Pre visit review using our clinic review tool, if applicable. No additional management support is needed unless otherwise documented below in the visit note. 

## 2015-11-25 ENCOUNTER — Ambulatory Visit (INDEPENDENT_AMBULATORY_CARE_PROVIDER_SITE_OTHER): Payer: Medicare Other | Admitting: Family Medicine

## 2015-11-25 ENCOUNTER — Telehealth: Payer: Self-pay | Admitting: Internal Medicine

## 2015-11-25 ENCOUNTER — Encounter: Payer: Self-pay | Admitting: Family Medicine

## 2015-11-25 VITALS — BP 130/88 | Temp 97.4°F | Ht 65.0 in | Wt 204.0 lb

## 2015-11-25 DIAGNOSIS — R300 Dysuria: Secondary | ICD-10-CM | POA: Diagnosis not present

## 2015-11-25 DIAGNOSIS — R350 Frequency of micturition: Secondary | ICD-10-CM

## 2015-11-25 DIAGNOSIS — Z87448 Personal history of other diseases of urinary system: Secondary | ICD-10-CM

## 2015-11-25 DIAGNOSIS — Z87440 Personal history of urinary (tract) infections: Secondary | ICD-10-CM | POA: Diagnosis not present

## 2015-11-25 LAB — POC URINALSYSI DIPSTICK (AUTOMATED)
Bilirubin, UA: NEGATIVE
Glucose, UA: NEGATIVE
Ketones, UA: NEGATIVE
Leukocytes, UA: NEGATIVE
Nitrite, UA: NEGATIVE
Spec Grav, UA: 1.015
Urobilinogen, UA: 0.2
pH, UA: 6

## 2015-11-25 MED ORDER — SULFAMETHOXAZOLE-TRIMETHOPRIM 800-160 MG PO TABS: 1.0000 | ORAL_TABLET | Freq: Two times a day (BID) | ORAL | Status: DC

## 2015-11-25 NOTE — Progress Notes (Signed)
Pre visit review using our clinic review tool, if applicable. No additional management support is needed unless otherwise documented below in the visit note. 

## 2015-11-25 NOTE — Addendum Note (Signed)
Addended by: Delano Metz A on: 11/25/2015 08:09 PM   Modules accepted: Miquel Dunn

## 2015-11-25 NOTE — Patient Instructions (Signed)
Please complete antibiotic as directed. If symptoms do not improve in 3-4 days, worsen, or you develop a fever >101 or flank pain, please RTC for further evaluation.  Urinary Tract Infection Urinary tract infections (UTIs) can develop anywhere along your urinary tract. Your urinary tract is your body's drainage system for removing wastes and extra water. Your urinary tract includes two kidneys, two ureters, a bladder, and a urethra. Your kidneys are a pair of bean-shaped organs. Each kidney is about the size of your fist. They are located below your ribs, one on each side of your spine. CAUSES Infections are caused by microbes, which are microscopic organisms, including fungi, viruses, and bacteria. These organisms are so small that they can only be seen through a microscope. Bacteria are the microbes that most commonly cause UTIs. SYMPTOMS  Symptoms of UTIs may vary by age and gender of the patient and by the location of the infection. Symptoms in young women typically include a frequent and intense urge to urinate and a painful, burning feeling in the bladder or urethra during urination. Older women and men are more likely to be tired, shaky, and weak and have muscle aches and abdominal pain. A fever may mean the infection is in your kidneys. Other symptoms of a kidney infection include pain in your back or sides below the ribs, nausea, and vomiting. DIAGNOSIS To diagnose a UTI, your caregiver will ask you about your symptoms. Your caregiver will also ask you to provide a urine sample. The urine sample will be tested for bacteria and white blood cells. White blood cells are made by your body to help fight infection. TREATMENT  Typically, UTIs can be treated with medication. Because most UTIs are caused by a bacterial infection, they usually can be treated with the use of antibiotics. The choice of antibiotic and length of treatment depend on your symptoms and the type of bacteria causing your  infection. HOME CARE INSTRUCTIONS  If you were prescribed antibiotics, take them exactly as your caregiver instructs you. Finish the medication even if you feel better after you have only taken some of the medication.  Drink enough water and fluids to keep your urine clear or pale yellow.  Avoid caffeine, tea, and carbonated beverages. They tend to irritate your bladder.  Empty your bladder often. Avoid holding urine for long periods of time.  Empty your bladder before and after sexual intercourse.  After a bowel movement, women should cleanse from front to back. Use each tissue only once. SEEK MEDICAL CARE IF:   You have back pain.  You develop a fever.  Your symptoms do not begin to resolve within 3 days. SEEK IMMEDIATE MEDICAL CARE IF:   You have severe back pain or lower abdominal pain.  You develop chills.  You have nausea or vomiting.  You have continued burning or discomfort with urination. MAKE SURE YOU:   Understand these instructions.  Will watch your condition.  Will get help right away if you are not doing well or get worse.   This information is not intended to replace advice given to you by your health care provider. Make sure you discuss any questions you have with your health care provider.   Document Released: 06/10/2005 Document Revised: 05/22/2015 Document Reviewed: 10/09/2011 Elsevier Interactive Patient Education [employer].

## 2015-11-25 NOTE — Telephone Encounter (Signed)
Patient Name: Valerie Jackson DOB: Aug 04, 1946 Initial Comment Caller states she may have UTI Nurse Assessment Nurse: Ronnald Ramp, RN, Miranda Date/Time (Eastern Time): 11/25/2015 8:18:37 AM Confirm and document reason for call. If symptomatic, describe symptoms. You must click the next button to save text entered. ---Caller states she is having urinary urgency and frequency for the last couple of days. Last night started having chills, vomiting, and fever. Also with a headache. Has the patient traveled out of the country within the last 30 days? ---No Does the patient have any new or worsening symptoms? ---Yes Will a triage be completed? ---Yes Related visit to physician within the last 2 weeks? ---No Does the PT have any chronic conditions? (i.e. diabetes, asthma, etc.) ---Yes List chronic conditions. ---Thyroid Is this a behavioral health or substance abuse call? ---No Guidelines Guideline Title Affirmed Question Affirmed Notes Urinary Symptoms Urinating more frequently than usual (i.e., frequency) Headache [1] Vomiting AND [2] 2 or more times (Exception: similar to previous migraines) Final Disposition User See Physician within 24 Hours Jones, Therapist, sports, Miranda Comments No appts available with PCP or at primary office. Appt scheduled for 11am and [place] with Almira Coaster, NP. Disagree/Comply: Comply Disagree/Comply: Comply

## 2015-11-25 NOTE — Progress Notes (Addendum)
Subjective:    Patient ID: Valerie Jackson, female    DOB: [birth year], 70 y.o.   MRN: [id number]  HPI  Valerie Jackson is a 70 year old female who presents today with frequency, and urgency when she urinates for 2 days. I am seeing this patient as her PCP is unavailable at this time. She noted urgency as the main symptom. She denies flank pain but notes some chills last night that was treated with BC powder.  Only 3-4 UTIs noted in the past but notes a history of pyelonephritis in her early 54s. She denies hematuria, N/V/D or vaginal discharge today. No recent antibiotic use noted. No treatments at home.  Review of Systems  Constitutional: Positive for chills. Negative for fever.  Respiratory: Negative for cough.   Cardiovascular: Negative for chest pain and palpitations.  Gastrointestinal: Negative for nausea, vomiting, abdominal pain and diarrhea.  Genitourinary: Positive for dysuria, urgency and frequency. Negative for hematuria and flank pain.  Musculoskeletal: Negative for myalgias and arthralgias.  Skin: Negative for rash.  Neurological: Negative for dizziness, light-headedness and headaches.   Past Medical History  Diagnosis Date  . Hypertension   . Hypothyroid   . Depression   . Hypercholesteremia   . Sickle cell trait ([place])   . GERD (gastroesophageal reflux disease)   . History of blood transfusion 1965  . Diverticulitis 09/2013    with perforation  . Pericolonic abscess 2015  . Obesity   . Hypokalemia   . H/O hiatal hernia   . Arthritis     fingers  . Headache(784.0)     migraine every 5- to 6 years  . Blood clot in vein 1968  . Hepatitis age 33 or 1    had heapatitis, not sure which kind  . Heart murmur since age 60    mild  . Diverticulitis of colon with perforation 09/28/2013    Social History   Social History  . Marital Status: Divorced    Spouse Name: N/A  . Number of Children: 1  . Years of Education: 14   Occupational History  . Retired     Therapist, sports    Social History Main Topics  . Smoking status: Former Smoker -- 0.25 packs/day for 50 years    Types: Cigarettes    Quit date: 09/06/2013  . Smokeless tobacco: Never Used  . Alcohol Use: No  . Drug Use: No  . Sexual Activity: Not Currently   Other Topics Concern  . Not on file   Social History Narrative   Regular exercise-no   Caffeine Use-yes    Past Surgical History  Procedure Laterality Date  . Cholecystectomy  2002  . Tonsillectomy  1952  . Colon resection N/A 10/11/2013    Procedure: EXPLORATORY LAPAROTOMY WITH RECTOSIGMOID COLECTOMY AND END COLOSTOMY;  Surgeon: Stark Klein, MD;  Location: [facility];  Service: General;  Laterality: N/A;  . Colostomy takedown N/A 02/27/2014    Procedure: COLOSTOMY TAKEDOWN [name] HERNIA REPAIR;  Surgeon: Stark Klein, MD;  Location: [facility];  Service: General;  Laterality: N/A;    Family History  Problem Relation Age of Onset  . Hypertension Mother   . Aneurysm Mother   . Cancer Mother     Uterine  . Thyroid cancer Mother   . Cancer Other   . Thyroid cancer Sister   . Arthritis Sister   . Hyperlipidemia Sister   . Hypertension Sister   . Stroke Sister   . Kidney disease Sister   .  Hyperlipidemia Brother   . Thyroid cancer Daughter   . Breast cancer Maternal Grandmother   . Hypertension Maternal Grandmother     Allergies  Allergen Reactions  . Estrogens     Blood clot in leg    Current Outpatient Prescriptions on File Prior to Visit  Medication Sig Dispense Refill  . calcium carbonate (OS-CAL - DOSED IN MG OF ELEMENTAL CALCIUM) 1250 (500 CA) MG tablet Take 1 tablet by mouth daily with breakfast.    . cholecalciferol (VITAMIN D) 1000 UNITS tablet Take 1,000 Units by mouth daily.    . clobetasol cream (TEMOVATE) 6.97 % Apply 1 application topically 2 (two) times daily. Apply BID for one week then once weekly 30 g 2  . levothyroxine (SYNTHROID, LEVOTHROID) 112 MCG tablet Take 1 tablet (112 mcg total) by mouth daily. 90  tablet 3  . MAGNESIUM PO Take 1 tablet by mouth daily.    [name] POTASSIUM PO Take 1 tablet by mouth daily.    [name] zolpidem (AMBIEN) 5 MG tablet Take 1 tablet (5 mg total) by mouth at bedtime as needed for sleep. 30 tablet 5  . Aclidinium Bromide (TUDORZA PRESSAIR) 400 MCG/ACT AEPB Inhale 1 puff into the lungs 2 (two) times daily. (Patient not taking: Reported on 11/25/2015) 3 each 3   No current facility-administered medications on file prior to visit.    BP 130/88 mmHg  Temp(Src) 97.4 F (36.3 C) (Oral)  Ht 5' 5"  (1.651 m)  Wt 204 lb (92.534 kg)  BMI 33.95 kg/m2       Objective:   Physical Exam  Constitutional: She is oriented to person, place, and time. She appears well-developed and well-nourished.  Eyes: Pupils are equal, round, and reactive to light.  Cardiovascular: Normal rate and regular rhythm.   Pulmonary/Chest: Effort normal and breath sounds normal. She has no wheezes.  Abdominal: Soft. Bowel sounds are normal. There is no tenderness. There is no rebound.  No suprapubic tenderness or  flank pain noted  Lymphadenopathy:    She has no cervical adenopathy.  Neurological: She is alert and oriented to person, place, and time.  Skin: Skin is warm and dry. No rash noted.  Psychiatric: She has a normal mood and affect. Her behavior is normal.       Assessment & Plan:  1. Dysuria +1 blood noted in urine  - POCT Urinalysis Dipstick (Automated) - sulfamethoxazole-trimethoprim (BACTRIM DS,SEPTRA DS) 800-160 MG tablet; Take 1 tablet by mouth 2 (two) times daily.  Dispense: 14 tablet; Refill: 0  2. Frequency of urination Empiric treatment started due to history of one episode of pyelonephritis "many years ago" which required hospitalization and patient noted chills at home. No documented fever in office today and no flank pain noted. -sulfamethoxazole-trimethoprim (BACTRIM DS,SEPTRA DS) 800-160 MG tablet; Take 1 tablet by mouth 2 (two) times daily.  Dispense: 14 tablet; Refill:  0 CrCL calculated based up creatinine level 09/25/15 using Ideal body weight: 32.49. Patient also noted adverse reactions with cipro in the past. Discussed treatment with Septra DS for one week with physician in office and short term dosing is indicated due to CrCl >30.   3. History of pyelonephritis - sulfamethoxazole-trimethoprim (BACTRIM DS,SEPTRA DS) 800-160 MG tablet; Take 1 tablet by mouth 2 (two) times daily.  Dispense: 14 tablet; Refill: 0  Advised patient to increase water intake and RTC if symptoms are not improving in 3-4 days, worsen, or she develops a fever >101 or flank pain.

## 2015-11-27 LAB — URINE CULTURE: Colony Count: 30000

## 2016-01-15 ENCOUNTER — Ambulatory Visit (INDEPENDENT_AMBULATORY_CARE_PROVIDER_SITE_OTHER): Payer: Medicare Other | Admitting: Internal Medicine

## 2016-01-15 ENCOUNTER — Other Ambulatory Visit (INDEPENDENT_AMBULATORY_CARE_PROVIDER_SITE_OTHER): Payer: Medicare Other

## 2016-01-15 ENCOUNTER — Encounter: Payer: Self-pay | Admitting: Internal Medicine

## 2016-01-15 VITALS — BP 136/82 | HR 83 | Temp 98.3°F | Resp 16 | Ht 65.0 in | Wt 209.0 lb

## 2016-01-15 DIAGNOSIS — R0600 Dyspnea, unspecified: Secondary | ICD-10-CM

## 2016-01-15 DIAGNOSIS — E038 Other specified hypothyroidism: Secondary | ICD-10-CM | POA: Diagnosis not present

## 2016-01-15 DIAGNOSIS — J439 Emphysema, unspecified: Secondary | ICD-10-CM | POA: Diagnosis not present

## 2016-01-15 DIAGNOSIS — I10 Essential (primary) hypertension: Secondary | ICD-10-CM

## 2016-01-15 DIAGNOSIS — R0609 Other forms of dyspnea: Secondary | ICD-10-CM | POA: Diagnosis not present

## 2016-01-15 DIAGNOSIS — N182 Chronic kidney disease, stage 2 (mild): Secondary | ICD-10-CM

## 2016-01-15 DIAGNOSIS — K458 Other specified abdominal hernia without obstruction or gangrene: Secondary | ICD-10-CM

## 2016-01-15 LAB — CBC WITH DIFFERENTIAL/PLATELET
Basophils Absolute: 0.1 10*3/uL (ref 0.0–0.1)
Basophils Relative: 0.8 % (ref 0.0–3.0)
Eosinophils Absolute: 0.4 10*3/uL (ref 0.0–0.7)
Eosinophils Relative: 3.7 % (ref 0.0–5.0)
HCT: 37.5 % (ref 36.0–46.0)
Hemoglobin: 12.9 g/dL (ref 12.0–15.0)
Lymphocytes Relative: 28 % (ref 12.0–46.0)
Lymphs Abs: 2.9 10*3/uL (ref 0.7–4.0)
MCHC: 34.5 g/dL (ref 30.0–36.0)
MCV: 87.4 fl (ref 78.0–100.0)
Monocytes Absolute: 0.5 10*3/uL (ref 0.1–1.0)
Monocytes Relative: 4.3 % (ref 3.0–12.0)
Neutro Abs: 6.6 10*3/uL (ref 1.4–7.7)
Neutrophils Relative %: 63.2 % (ref 43.0–77.0)
Platelets: 243 10*3/uL (ref 150.0–400.0)
RBC: 4.29 Mil/uL (ref 3.87–5.11)
RDW: 14 % (ref 11.5–15.5)
WBC: 10.5 10*3/uL (ref 4.0–10.5)

## 2016-01-15 LAB — BASIC METABOLIC PANEL
BUN: 14 mg/dL (ref 6–23)
CO2: 29 mEq/L (ref 19–32)
Calcium: 10.8 mg/dL — ABNORMAL HIGH (ref 8.4–10.5)
Chloride: 102 mEq/L (ref 96–112)
Creatinine, Ser: 1.35 mg/dL — ABNORMAL HIGH (ref 0.40–1.20)
GFR: 49.73 mL/min — ABNORMAL LOW (ref >60.00)
Glucose, Bld: 96 mg/dL (ref 70–99)
Potassium: 3.7 mEq/L (ref 3.5–5.1)
Sodium: 139 mEq/L (ref 135–145)

## 2016-01-15 LAB — TSH: TSH: 1.08 u[IU]/mL (ref 0.35–4.50)

## 2016-01-15 MED ORDER — UMECLIDINIUM BROMIDE 62.5 MCG/INH IN AEPB: 1.0000 | INHALATION_SPRAY | Freq: Every day | RESPIRATORY_TRACT | Status: DC

## 2016-01-15 NOTE — Patient Instructions (Signed)

## 2016-01-15 NOTE — Progress Notes (Signed)
Subjective:  Patient ID: Valerie Jackson, female    DOB: 1946-06-19  Age: 70 y.o. MRN: [id number]  CC: Hypothyroidism and COPD   HPI Valerie Jackson presents for follow-up on COPD, hypothyroidism, and ventral hernia. She complains that the hernia in her abdomen is becoming larger and she wants to see a surgeon to see if he can be repaired. She is also had a few episodes recently of shortness of breath and dyspnea on exertion. The dyspnea on exertion develops after she is climb 2 flights of stairs. She denies chest pain, diaphoresis, edema, palpitations, dizziness, or near-syncope. She tells me she had a normal thallium scan done about 10 years ago. She has not been using her COPD inhaler because it was too expensive. She complains of weight gain and thinning hair.  Outpatient Prescriptions Prior to Visit  Medication Sig Dispense Refill  . calcium carbonate (OS-CAL - DOSED IN MG OF ELEMENTAL CALCIUM) 1250 (500 CA) MG tablet Take 1 tablet by mouth daily with breakfast.    . cholecalciferol (VITAMIN D) 1000 UNITS tablet Take 1,000 Units by mouth daily.    [name] levothyroxine (SYNTHROID, LEVOTHROID) 112 MCG tablet Take 1 tablet (112 mcg total) by mouth daily. 90 tablet 3  . MAGNESIUM PO Take 1 tablet by mouth daily.    [name] POTASSIUM PO Take 1 tablet by mouth daily.    [name] zolpidem (AMBIEN) 5 MG tablet Take 1 tablet (5 mg total) by mouth at bedtime as needed for sleep. 30 tablet 5  . sulfamethoxazole-trimethoprim (BACTRIM DS,SEPTRA DS) 800-160 MG tablet Take 1 tablet by mouth 2 (two) times daily. 14 tablet 0  . clobetasol cream (TEMOVATE) 4.09 % Apply 1 application topically 2 (two) times daily. Apply BID for one week then once weekly (Patient not taking: Reported on 01/15/2016) 30 g 2  . Aclidinium Bromide (TUDORZA PRESSAIR) 400 MCG/ACT AEPB Inhale 1 puff into the lungs 2 (two) times daily. (Patient not taking: Reported on 01/15/2016) 3 each 3   No facility-administered medications prior to visit.     ROS Review of Systems  Constitutional: Positive for unexpected weight change. Negative for fever, chills, diaphoresis, appetite change and fatigue.  HENT: Negative.   Eyes: Negative.   Respiratory: Positive for shortness of breath. Negative for cough, choking, chest tightness, wheezing and stridor.   Cardiovascular: Negative.  Negative for chest pain, palpitations and leg swelling.  Gastrointestinal: Negative.  Negative for nausea, vomiting, abdominal pain, diarrhea and constipation.  Endocrine: Negative.   Genitourinary: Negative.   Musculoskeletal: Negative.  Negative for myalgias, back pain, arthralgias and neck pain.  Skin: Negative.  Negative for color change and rash.  Allergic/Immunologic: Negative.   Neurological: Negative.  Negative for dizziness, tremors, weakness, light-headedness, numbness and headaches.  Hematological: Negative.  Negative for adenopathy. Does not bruise/bleed easily.  Psychiatric/Behavioral: Negative.     Objective:  BP 136/82 mmHg  Pulse 83  Temp(Src) 98.3 F (36.8 C) (Oral)  Resp 16  Ht 5' 5"  (1.651 m)  Wt 209 lb (94.802 kg)  BMI 34.78 kg/m2  SpO2 94%  BP Readings from Last 3 Encounters:  01/15/16 136/82  11/25/15 130/88  09/25/15 128/80    Wt Readings from Last 3 Encounters:  01/15/16 209 lb (94.802 kg)  11/25/15 204 lb (92.534 kg)  09/25/15 204 lb (92.534 kg)    Physical Exam  Constitutional: She is oriented to person, place, and time. No distress.  HENT:  Head: Normocephalic and atraumatic.  Mouth/Throat: No oropharyngeal exudate.  Eyes: Conjunctivae are normal. Right eye exhibits no discharge. Left eye exhibits no discharge. No scleral icterus.  Neck: Normal range of motion. Neck supple. No JVD present. No tracheal deviation present. No thyromegaly present.  Cardiovascular: Normal rate, regular rhythm, normal heart sounds and intact distal pulses.  Exam reveals no gallop and no friction rub.   No murmur  heard. Pulmonary/Chest: Effort normal and breath sounds normal. No stridor. No respiratory distress. She has no wheezes. She has no rales. She exhibits no tenderness.  Abdominal: Soft. Normal appearance and bowel sounds are normal. She exhibits no shifting dullness, no distension and no mass. There is no hepatosplenomegaly. There is no tenderness. There is no rebound, no guarding and no CVA tenderness. A hernia is present. Hernia confirmed positive in the ventral area. Hernia confirmed negative in the right inguinal area and confirmed negative in the left inguinal area.    Musculoskeletal: Normal range of motion. She exhibits no edema or tenderness.  Lymphadenopathy:    She has no cervical adenopathy.  Neurological: She is oriented to person, place, and time.  Skin: Skin is warm and dry. No rash noted. She is not diaphoretic. No erythema. No pallor.  Vitals reviewed.   Lab Results  Component Value Date   WBC 10.5 01/15/2016   HGB 12.9 01/15/2016   HCT 37.5 01/15/2016   PLT 243.0 01/15/2016   GLUCOSE 96 01/15/2016   CHOL 219* 05/23/2015   TRIG 126.0 05/23/2015   HDL 46.00 05/23/2015   LDLDIRECT 226.0 02/27/2013   LDLCALC 147* 05/23/2015   ALT 31 08/29/2015   AST 26 08/29/2015   NA 139 01/15/2016   K 3.7 01/15/2016   CL 102 01/15/2016   CREATININE 1.35* 01/15/2016   BUN 14 01/15/2016   CO2 29 01/15/2016   TSH 1.08 01/15/2016   INR 1.04 02/21/2014   HGBA1C 4.7 05/14/2014    No results found.  Assessment & Plan:   [name] was seen today for hypothyroidism and copd.  Diagnoses and all orders for this visit:  Other specified hypothyroidism- her TSH is in the normal range, she will remain on the current thyroid replacement therapy. -     TSH; Future  CKD (chronic kidney disease) stage 2, GFR 60-89 ml/min- Her renal function is stable, she will continue to avoid nephrotoxic agents. -     Basic metabolic panel; Future  Essential hypertension- her blood pressures adequately  well-controlled. -     Basic metabolic panel; Future -     CBC with Differential/Platelet; Future  Pulmonary emphysema, unspecified emphysema type ([place])- I will change her inhaler from [place] to Incruse -     umeclidinium bromide (INCRUSE ELLIPTA) 62.5 MCG/INH AEPB; Inhale 1 puff into the lungs daily.  DOE (dyspnea on exertion)- I could not do an EKG on her today since our EKG machine was not functioning. -     CBC with Differential/Platelet; Future -     EKG 12-Lead  Hypercalcemia- this is a new finding today, I have asked her to return for a recheck of the calcium level in addition to checking phosphorus, magnesium and a PTH level to screen for hyperparathyroidism. -     Phosphorus; Future -     Magnesium; Future -     PTH, intact and calcium; Future  Recurrent abdominal hernia without obstruction or gangrene, unspecified hernia type -     Ambulatory referral to General Surgery   I have discontinued Ms. Gingras Aclidinium Bromide and sulfamethoxazole-trimethoprim. I am also having  her start on umeclidinium bromide. Additionally, I am having her maintain her zolpidem, levothyroxine, POTASSIUM PO, MAGNESIUM PO, clobetasol cream, cholecalciferol, and calcium carbonate.  Meds ordered this encounter  Medications  . umeclidinium bromide (INCRUSE ELLIPTA) 62.5 MCG/INH AEPB    Sig: Inhale 1 puff into the lungs daily.    Dispense:  90 each    Refill:  3     Follow-up: Return in about 6 months (around 07/17/2016).  [name], MD

## 2016-01-15 NOTE — Progress Notes (Signed)
Pre visit review using our clinic review tool, if applicable. No additional management support is needed unless otherwise documented below in the visit note. 

## 2016-01-16 ENCOUNTER — Encounter: Payer: Self-pay | Admitting: Internal Medicine

## 2016-01-16 DIAGNOSIS — K469 Unspecified abdominal hernia without obstruction or gangrene: Secondary | ICD-10-CM | POA: Insufficient documentation

## 2016-02-21 ENCOUNTER — Ambulatory Visit: Payer: Self-pay | Admitting: General Surgery

## 2016-02-21 DIAGNOSIS — K432 Incisional hernia without obstruction or gangrene: Secondary | ICD-10-CM | POA: Diagnosis not present

## 2016-02-21 NOTE — H&P (Signed)
History of Present Illness Valerie Jackson; 02/21/2016 11:28 AM) Patient words: recheck hernia.  The patient is a 70 year old female who presents with an incisional hernia. Patient is a 70 year old female who previously has had a previous sigmoid colectomy, Hartman's procedure, Hartman's reversal via a midline incision with her last Hartman's reversal been on June 2015.Valerie Jackson Patient states that soon after surgery shows an incisional hernia periumbilically.  Patient states that this is reducible. She states that it doesn't times cause her some pain.  Patient was a previous smoker prior to her sigmoid colectomy for 50 years. Patient does have COPD, currently is not on any daily inhalers.   Allergies Valerie Lorenzo, LPN; 02/15/8755 43:32 AM) Estrogens *ESTROGENS*  Medication History Valerie Lorenzo, LPN; 05/20/1883 16:60 AM) Oscal 500/200 D-3 (500-200MG-UNIT Tablet, Oral) Active. Synthroid (125MCG Tablet, Oral daily) Active. Spiriva Respimat (2.5MCG/ACT Aerosol Soln, Inhalation prn) Active. Ambien (5MG Tablet, Oral prn) Active. Medications Reconciled  Vitals Valerie Billings Dockery LPN; 02/15/159 10:93 AM) 02/21/2016 10:42 AM Weight: 204.6 lb Height: 66in Body Surface Area: 2.02 m Body Mass Index: 33.02 kg/m  Temp.: 98.59F(Oral)  Pulse: 90 (Regular)  BP: 152/102 (Sitting, Left Arm, Standard) recheck 10 minutes 152/98      Physical Exam Valerie Jackson; 02/21/2016 11:28 AM) General Mental Status-Alert. General Appearance-Consistent with stated age. Hydration-Well hydrated. Voice-Normal.  Head and Neck Head-normocephalic, atraumatic with no lesions or palpable masses. Trachea-midline. Thyroid Gland Characteristics - normal size and consistency.  Chest and Lung Exam Chest and lung exam reveals -quiet, even and easy respiratory effort with no use of accessory muscles and on auscultation, normal breath sounds, no adventitious sounds and normal vocal  resonance. Inspection Chest Wall - Normal. Back - normal.  Cardiovascular Cardiovascular examination reveals -normal heart sounds, regular rate and rhythm with no murmurs and normal pedal pulses bilaterally.  Abdomen Inspection Skin - Scar - no surgical scars. Hernias - Incisional - Reducible(Approximate 4-5 cm paraumbilical incisional hernia). Palpation/Percussion Normal exam - Soft, Non Tender, No Rebound tenderness, No Rigidity (guarding) and No hepatosplenomegaly. Auscultation Normal exam - Bowel sounds normal.    Assessment & Plan Valerie Jackson; 02/21/2016 11:29 AM) Valerie Jackson HERNIA, WITHOUT OBSTRUCTION OR GANGRENE (K43.2) Impression: 70 year old female with a proximally for 4-5 cm incisional hernia periumbilically,  1. The patient will like to proceed to the operating room for laparoscopic lysis of adhesions and incisional hernia repair with mesh.  2. I discussed with the patient the signs and symptoms of incarceration and strangulation and the need to proceed to the ER should they occur.  3. I discussed with the patient the risks and benefits of the procedure to include but not limited to: Infection, bleeding, damage to surrounding structures, possible need for further surgery, possible nerve pain, and possible recurrence. The patient was understanding and wishes to proceed.

## 2016-02-25 ENCOUNTER — Ambulatory Visit (INDEPENDENT_AMBULATORY_CARE_PROVIDER_SITE_OTHER): Payer: Medicare Other | Admitting: Internal Medicine

## 2016-02-25 ENCOUNTER — Encounter: Payer: Self-pay | Admitting: Internal Medicine

## 2016-02-25 ENCOUNTER — Other Ambulatory Visit (INDEPENDENT_AMBULATORY_CARE_PROVIDER_SITE_OTHER): Payer: Medicare Other

## 2016-02-25 VITALS — BP 144/102 | HR 96 | Temp 97.9°F | Resp 16 | Ht 65.0 in | Wt 202.0 lb

## 2016-02-25 DIAGNOSIS — E038 Other specified hypothyroidism: Secondary | ICD-10-CM | POA: Diagnosis not present

## 2016-02-25 DIAGNOSIS — R0600 Dyspnea, unspecified: Secondary | ICD-10-CM

## 2016-02-25 DIAGNOSIS — R0609 Other forms of dyspnea: Secondary | ICD-10-CM | POA: Diagnosis not present

## 2016-02-25 DIAGNOSIS — I10 Essential (primary) hypertension: Secondary | ICD-10-CM

## 2016-02-25 LAB — BASIC METABOLIC PANEL
BUN: 17 mg/dL (ref 6–23)
CO2: 27 mEq/L (ref 19–32)
Calcium: 10.3 mg/dL (ref 8.4–10.5)
Chloride: 105 mEq/L (ref 96–112)
Creatinine, Ser: 1.41 mg/dL — ABNORMAL HIGH (ref 0.40–1.20)
GFR: 47.28 mL/min — ABNORMAL LOW (ref >60.00)
Glucose, Bld: 84 mg/dL (ref 70–99)
Potassium: 4 mEq/L (ref 3.5–5.1)
Sodium: 140 mEq/L (ref 135–145)

## 2016-02-25 LAB — TROPONIN I: TNIDX: 0.02 ug/l (ref 0.00–0.06)

## 2016-02-25 LAB — CARDIAC PANEL
CK-MB: 2.4 ng/mL (ref 0.3–4.0)
Relative Index: 4.1 calc — ABNORMAL HIGH (ref 0.0–2.5)
Total CK: 58 U/L (ref 7–177)

## 2016-02-25 LAB — PHOSPHORUS: Phosphorus: 3.4 mg/dL (ref 2.3–4.6)

## 2016-02-25 LAB — MAGNESIUM: Magnesium: 2.3 mg/dL (ref 1.5–2.5)

## 2016-02-25 MED ORDER — CHLORTHALIDONE 25 MG PO TABS: 25.0000 mg | ORAL_TABLET | Freq: Every day | ORAL | Status: DC

## 2016-02-25 NOTE — Patient Instructions (Signed)
Hypertension Hypertension, commonly called high blood pressure, is when the force of blood pumping through your arteries is too strong. Your arteries are the blood vessels that carry blood from your heart throughout your body. A blood pressure reading consists of a higher number over a lower number, such as 110/72. The higher number (systolic) is the pressure inside your arteries when your heart pumps. The lower number (diastolic) is the pressure inside your arteries when your heart relaxes. Ideally you want your blood pressure below 120/80. Hypertension forces your heart to work harder to pump blood. Your arteries may become narrow or stiff. Having untreated or uncontrolled hypertension can cause heart attack, stroke, kidney disease, and other problems. RISK FACTORS Some risk factors for high blood pressure are controllable. Others are not.  Risk factors you cannot control include:   Race. You may be at higher risk if you are African American.  Age. Risk increases with age.  Gender. Men are at higher risk than women before age 45 years. After age 65, women are at higher risk than men. Risk factors you can control include:  Not getting enough exercise or physical activity.  Being overweight.  Getting too much fat, sugar, calories, or salt in your diet.  Drinking too much alcohol. SIGNS AND SYMPTOMS Hypertension does not usually cause signs or symptoms. Extremely high blood pressure (hypertensive crisis) may cause headache, anxiety, shortness of breath, and nosebleed. DIAGNOSIS To check if you have hypertension, your health care provider will measure your blood pressure while you are seated, with your arm held at the level of your heart. It should be measured at least twice using the same arm. Certain conditions can cause a difference in blood pressure between your right and left arms. A blood pressure reading that is higher than normal on one occasion does not mean that you need treatment. If  it is not clear whether you have high blood pressure, you may be asked to return on a different day to have your blood pressure checked again. Or, you may be asked to monitor your blood pressure at home for 1 or more weeks. TREATMENT Treating high blood pressure includes making lifestyle changes and possibly taking medicine. Living a healthy lifestyle can help lower high blood pressure. You may need to change some of your habits. Lifestyle changes may include:  Following the DASH diet. This diet is high in fruits, vegetables, and whole grains. It is low in salt, red meat, and added sugars.  Keep your sodium intake below 2,300 mg per day.  Getting at least 30-45 minutes of aerobic exercise at least 4 times per week.  Losing weight if necessary.  Not smoking.  Limiting alcoholic beverages.  Learning ways to reduce stress. Your health care provider may prescribe medicine if lifestyle changes are not enough to get your blood pressure under control, and if one of the following is true:  You are 18-59 years of age and your systolic blood pressure is above 140.  You are 60 years of age or older, and your systolic blood pressure is above 150.  Your diastolic blood pressure is above 90.  You have diabetes, and your systolic blood pressure is over 140 or your diastolic blood pressure is over 90.  You have kidney disease and your blood pressure is above 140/90.  You have heart disease and your blood pressure is above 140/90. Your personal target blood pressure may vary depending on your medical conditions, your age, and other factors. HOME CARE INSTRUCTIONS    Have your blood pressure rechecked as directed by your health care provider.   Take medicines only as directed by your health care provider. Follow the directions carefully. Blood pressure medicines must be taken as prescribed. The medicine does not work as well when you skip doses. Skipping doses also puts you at risk for  problems.  Do not smoke.   Monitor your blood pressure at home as directed by your health care provider. SEEK MEDICAL CARE IF:   You think you are having a reaction to medicines taken.  You have recurrent headaches or feel dizzy.  You have swelling in your ankles.  You have trouble with your vision. SEEK IMMEDIATE MEDICAL CARE IF:  You develop a severe headache or confusion.  You have unusual weakness, numbness, or feel faint.  You have severe chest or abdominal pain.  You vomit repeatedly.  You have trouble breathing. MAKE SURE YOU:   Understand these instructions.  Will watch your condition.  Will get help right away if you are not doing well or get worse.   This information is not intended to replace advice given to you by your health care provider. Make sure you discuss any questions you have with your health care provider.   Document Released: 08/31/2005 Document Revised: 01/15/2015 Document Reviewed: 06/23/2013 Elsevier Interactive Patient Education 2016 [employer]  

## 2016-02-25 NOTE — Progress Notes (Signed)
Pre visit review using our clinic review tool, if applicable. No additional management support is needed unless otherwise documented below in the visit note. 

## 2016-02-25 NOTE — Progress Notes (Signed)
Subjective:  Patient ID: Valerie Jackson, female    DOB: Jul 01, 1946  Age: 70 y.o. MRN: [id number]  CC: Hypertension   HPI Valerie Jackson presents for f/up on high calcium level. She also tells me for the last month her blood pressure has not been well controlled. Her numbers at home have been around 160/90. She also complains that she has developed dyspnea on exertion over the last 3-4 weeks but she denies chest pain/diaphoresis/edema/palpitations/syncope/neck pain/or fatigue.  Outpatient Prescriptions Prior to Visit  Medication Sig Dispense Refill  . calcium carbonate (OS-CAL - DOSED IN MG OF ELEMENTAL CALCIUM) 1250 (500 CA) MG tablet Take 1 tablet by mouth daily with breakfast.    . cholecalciferol (VITAMIN D) 1000 UNITS tablet Take 1,000 Units by mouth daily.    . clobetasol cream (TEMOVATE) 3.29 % Apply 1 application topically 2 (two) times daily. Apply BID for one week then once weekly (Patient taking differently: Apply 1 application topically 2 (two) times daily as needed (rash). ) 30 g 2  . levothyroxine (SYNTHROID, LEVOTHROID) 112 MCG tablet Take 1 tablet (112 mcg total) by mouth daily. 90 tablet 3  . umeclidinium bromide (INCRUSE ELLIPTA) 62.5 MCG/INH AEPB Inhale 1 puff into the lungs daily. (Patient taking differently: Inhale 1 puff into the lungs every other day. ) 90 each 3  . zolpidem (AMBIEN) 5 MG tablet Take 1 tablet (5 mg total) by mouth at bedtime as needed for sleep. 30 tablet 5   No facility-administered medications prior to visit.    ROS Review of Systems  Constitutional: Negative.  Negative for fever, chills, diaphoresis, appetite change and fatigue.  HENT: Negative.   Eyes: Negative.  Negative for visual disturbance.  Respiratory: Positive for shortness of breath (DOE). Negative for cough, choking, chest tightness, wheezing and stridor.   Cardiovascular: Negative.  Negative for chest pain, palpitations and leg swelling.  Gastrointestinal: Negative.  Negative for  nausea, vomiting, abdominal pain, diarrhea and constipation.  Endocrine: Negative.   Genitourinary: Negative.   Musculoskeletal: Negative.  Negative for back pain and neck pain.  Skin: Negative.  Negative for color change and rash.  Allergic/Immunologic: Negative.   Neurological: Negative.  Negative for dizziness, syncope, weakness, light-headedness, numbness and headaches.  Hematological: Negative.  Negative for adenopathy. Does not bruise/bleed easily.  Psychiatric/Behavioral: Negative.     Objective:  BP 144/102 mmHg  Pulse 96  Temp(Src) 97.9 F (36.6 C) (Oral)  Resp 16  Ht 5' 5"  (1.651 m)  Wt 202 lb (91.627 kg)  BMI 33.61 kg/m2  SpO2 97%  BP Readings from Last 3 Encounters:  02/25/16 144/102  01/15/16 136/82  11/25/15 130/88    Wt Readings from Last 3 Encounters:  02/25/16 202 lb (91.627 kg)  01/15/16 209 lb (94.802 kg)  11/25/15 204 lb (92.534 kg)    Physical Exam  Constitutional: She is oriented to person, place, and time. No distress.  HENT:  Mouth/Throat: Oropharynx is clear and moist. No oropharyngeal exudate.  Eyes: Conjunctivae are normal. Right eye exhibits no discharge. Left eye exhibits no discharge. No scleral icterus.  Neck: Normal range of motion. Neck supple. No JVD present. No tracheal deviation present. No thyromegaly present.  Cardiovascular: Normal rate, regular rhythm, normal heart sounds and intact distal pulses.  Exam reveals no gallop and no friction rub.   No murmur heard. EKG ---  Sinus  Rhythm  - frequent ectopic ventricular beat s  # VECs = 2 BORDERLINE RHYTHM  Pulmonary/Chest: Effort normal and breath sounds  normal. No stridor. No respiratory distress. She has no wheezes. She has no rales. She exhibits no tenderness.  Abdominal: Soft. Bowel sounds are normal. She exhibits no distension and no mass. There is no tenderness. There is no rebound and no guarding.  Musculoskeletal: Normal range of motion. She exhibits no edema or tenderness.    Lymphadenopathy:    She has no cervical adenopathy.  Neurological: She is oriented to person, place, and time.  Skin: Skin is warm and dry. No rash noted. She is not diaphoretic. No erythema. No pallor.  Vitals reviewed.   Lab Results  Component Value Date   WBC 10.5 01/15/2016   HGB 12.9 01/15/2016   HCT 37.5 01/15/2016   PLT 243.0 01/15/2016   GLUCOSE 84 02/25/2016   CHOL 219* 05/23/2015   TRIG 126.0 05/23/2015   HDL 46.00 05/23/2015   LDLDIRECT 226.0 02/27/2013   LDLCALC 147* 05/23/2015   ALT 31 08/29/2015   AST 26 08/29/2015   NA 140 02/25/2016   K 4.0 02/25/2016   CL 105 02/25/2016   CREATININE 1.41* 02/25/2016   BUN 17 02/25/2016   CO2 27 02/25/2016   TSH 1.08 01/15/2016   INR 1.04 02/21/2014   HGBA1C 4.7 05/14/2014    No results found.  Assessment & Plan:   Valerie Jackson was seen today for hypertension.  Diagnoses and all orders for this visit:  Essential hypertension- her blood pressure is not well controlled, I've asked her to start taking about a thiazide diuretic -     Basic metabolic panel; Future -     EKG 12-Lead -     chlorthalidone (HYGROTON) 25 MG tablet; Take 1 tablet (25 mg total) by mouth daily.  Other specified hypothyroidism- her TSH is in the normal range, she'll remain on current dose of levothyroxine  Hypercalcemia- her calcium level is lower than it was before but is still on the high normal range, her PTH level is mildly elevated, I've asked her to see endocrinology to be evaluated for possible hyperparathyroidism. -     Phosphorus; Future -     Basic metabolic panel; Future -     PTH, intact and calcium; Future -     Ambulatory referral to Endocrinology  DOE (dyspnea on exertion)- her EKG is negative for LVH or signs of ischemia though there were 2 ectopic ventricular beats. Her cardiac enzymes and troponin are negative, I've asked her to undergo an exercise tolerance test to screen for coronary artery disease. -     EKG 12-Lead -      EXERCISE TOLERANCE TEST; Future -     Troponin I; Future -     Cardiac panel; Future   I am having Valerie Jackson start on chlorthalidone. I am also having her maintain her zolpidem, levothyroxine, clobetasol cream, cholecalciferol, calcium carbonate, and umeclidinium bromide.  Meds ordered this encounter  Medications  . chlorthalidone (HYGROTON) 25 MG tablet    Sig: Take 1 tablet (25 mg total) by mouth daily.    Dispense:  90 tablet    Refill:  1     Follow-up: Return in about 2 months (around 04/26/2016).  [name], MD

## 2016-02-26 ENCOUNTER — Telehealth (HOSPITAL_COMMUNITY): Payer: Self-pay | Admitting: *Deleted

## 2016-02-26 LAB — PTH, INTACT AND CALCIUM
Calcium: 9.8 mg/dL (ref 8.4–10.5)
PTH: 86 pg/mL — ABNORMAL HIGH (ref 14–64)

## 2016-02-26 NOTE — Telephone Encounter (Signed)
Left message for patient to call to schedule ETT ordered by Dr. [name]

## 2016-03-03 ENCOUNTER — Encounter (HOSPITAL_COMMUNITY): Payer: Self-pay

## 2016-03-03 ENCOUNTER — Observation Stay (HOSPITAL_COMMUNITY)
Admission: RE | Admit: 2016-03-03 | Discharge: 2016-03-06 | Disposition: A | Discharge status: Home / Self Care | Payer: Medicare Other | Source: Ambulatory Visit | Attending: General Surgery | Admitting: General Surgery

## 2016-03-03 ENCOUNTER — Encounter (HOSPITAL_COMMUNITY)
Admission: RE | Admit: 2016-03-03 | Discharge: 2016-03-03 | Disposition: A | Discharge status: Home / Self Care | Payer: Medicare Other | Source: Ambulatory Visit | Attending: General Surgery | Admitting: General Surgery

## 2016-03-03 DIAGNOSIS — I10 Essential (primary) hypertension: Secondary | ICD-10-CM | POA: Insufficient documentation

## 2016-03-03 DIAGNOSIS — Z8719 Personal history of other diseases of the digestive system: Secondary | ICD-10-CM

## 2016-03-03 DIAGNOSIS — Z87891 Personal history of nicotine dependence: Secondary | ICD-10-CM | POA: Insufficient documentation

## 2016-03-03 DIAGNOSIS — Z79899 Other long term (current) drug therapy: Secondary | ICD-10-CM | POA: Insufficient documentation

## 2016-03-03 DIAGNOSIS — K660 Peritoneal adhesions (postprocedural) (postinfection): Secondary | ICD-10-CM | POA: Insufficient documentation

## 2016-03-03 DIAGNOSIS — Z9049 Acquired absence of other specified parts of digestive tract: Secondary | ICD-10-CM | POA: Diagnosis not present

## 2016-03-03 DIAGNOSIS — J449 Chronic obstructive pulmonary disease, unspecified: Secondary | ICD-10-CM | POA: Insufficient documentation

## 2016-03-03 DIAGNOSIS — K432 Incisional hernia without obstruction or gangrene: Principal | ICD-10-CM | POA: Insufficient documentation

## 2016-03-03 DIAGNOSIS — R109 Unspecified abdominal pain: Secondary | ICD-10-CM | POA: Diagnosis not present

## 2016-03-03 DIAGNOSIS — Z9889 Other specified postprocedural states: Secondary | ICD-10-CM

## 2016-03-03 DIAGNOSIS — E039 Hypothyroidism, unspecified: Secondary | ICD-10-CM | POA: Insufficient documentation

## 2016-03-03 HISTORY — DX: Chronic obstructive pulmonary disease, unspecified: J44.9

## 2016-03-03 HISTORY — DX: Unspecified cataract: H26.9

## 2016-03-03 HISTORY — DX: Pneumonia, unspecified organism: J18.9

## 2016-03-03 HISTORY — DX: Dizziness and giddiness: R42

## 2016-03-03 HISTORY — DX: Personal history of urinary (tract) infections: Z87.440

## 2016-03-03 HISTORY — DX: Chronic kidney disease, unspecified: N18.9

## 2016-03-03 HISTORY — DX: Other vitreous opacities, unspecified eye: H43.399

## 2016-03-03 LAB — COMPREHENSIVE METABOLIC PANEL
ALT: 31 U/L (ref 14–54)
AST: 26 U/L (ref 15–41)
Albumin: 4.4 g/dL (ref 3.5–5.0)
Alkaline Phosphatase: 67 U/L (ref 38–126)
Anion gap: 8 (ref 5–15)
BUN: 24 mg/dL — ABNORMAL HIGH (ref 6–20)
CO2: 26 mmol/L (ref 22–32)
Calcium: 9.9 mg/dL (ref 8.9–10.3)
Chloride: 106 mmol/L (ref 101–111)
Creatinine, Ser: 1.5 mg/dL — ABNORMAL HIGH (ref 0.44–1.00)
GFR calc Af Amer: 39 mL/min — ABNORMAL LOW (ref >60)
GFR calc non Af Amer: 34 mL/min — ABNORMAL LOW (ref >60)
Glucose, Bld: 114 mg/dL — ABNORMAL HIGH (ref 65–99)
Potassium: 3.9 mmol/L (ref 3.5–5.1)
Sodium: 140 mmol/L (ref 135–145)
Total Bilirubin: 1.1 mg/dL (ref 0.3–1.2)
Total Protein: 8.2 g/dL — ABNORMAL HIGH (ref 6.5–8.1)

## 2016-03-03 LAB — PROTIME-INR
INR: 1.1 (ref 0.00–1.49)
Prothrombin Time: 14.4 seconds (ref 11.6–15.2)

## 2016-03-03 LAB — CBC
HCT: 39.4 % (ref 36.0–46.0)
Hemoglobin: 13.2 g/dL (ref 12.0–15.0)
MCH: 29.7 pg (ref 26.0–34.0)
MCHC: 33.5 g/dL (ref 30.0–36.0)
MCV: 88.5 fL (ref 78.0–100.0)
Platelets: 249 10*3/uL (ref 150–400)
RBC: 4.45 MIL/uL (ref 3.87–5.11)
RDW: 13.2 % (ref 11.5–15.5)
WBC: 8.3 10*3/uL (ref 4.0–10.5)

## 2016-03-03 NOTE — Patient Instructions (Addendum)
Valerie Jackson  03/03/2016   Your procedure is scheduled on: Wednesday March 04, 2016  Report to [facility] Main  Entrance take [place]  elevators to 3rd floor to  [place] at  10:30 AM.  Call this number if you have problems the morning of surgery [phone]   Remember: ONLY 1 PERSON MAY GO WITH YOU TO SHORT STAY TO GET  READY MORNING OF [place].  Do not eat food or drink liquids :After Midnight.     Take these medicines the morning of surgery with A SIP OF WATER: Levothyroxine; May use Incruse (Ellipta) Inhaler                                 You may not have any metal on your body including hair pins and              piercings  Do not wear jewelry, make-up, lotions, powders or perfumes, deodorant             Do not wear nail polish.  Do not shave  48 hours prior to surgery.               Do not bring valuables to the hospital. [place].  Contacts, dentures or bridgework may not be worn into surgery.      Patients discharged the day of surgery will not be allowed to drive home.  Name and phone number of your driver:Desiree Tamala Julian (daughter)  _____________________________________________________________________             [facility] - Preparing for Surgery Before surgery, you can play an important role.  Because skin is not sterile, your skin needs to be as free of germs as possible.  You can reduce the number of germs on your skin by washing with CHG (chlorahexidine gluconate) soap before surgery.  CHG is an antiseptic cleaner which kills germs and bonds with the skin to continue killing germs even after washing. Please DO NOT use if you have an allergy to CHG or antibacterial soaps.  If your skin becomes reddened/irritated stop using the CHG and inform your nurse when you arrive at Short Stay. Do not shave (including legs and underarms) for at least 48 hours prior to the first CHG shower.   You may shave your face/neck. Please follow these instructions carefully:  1.  Shower with CHG Soap the night before surgery and the  morning of Surgery.  2.  If you choose to wash your hair, wash your hair first as usual with your  normal  shampoo.  3.  After you shampoo, rinse your hair and body thoroughly to remove the  shampoo.                           4.  Use CHG as you would any other liquid soap.  You can apply chg directly  to the skin and wash                       Gently with a scrungie or clean washcloth.  5.  Apply the CHG Soap to your body ONLY FROM THE NECK DOWN.  Do not use on face/ open                           Wound or open sores. Avoid contact with eyes, ears mouth and genitals (private parts).                       Wash face,  Genitals (private parts) with your normal soap.             6.  Wash thoroughly, paying special attention to the area where your surgery  will be performed.  7.  Thoroughly rinse your body with warm water from the neck down.  8.  DO NOT shower/wash with your normal soap after using and rinsing off  the CHG Soap.                9.  Pat yourself dry with a clean towel.            10.  Wear clean pajamas.            11.  Place clean sheets on your bed the night of your first shower and do not  sleep with pets. Day of Surgery : Do not apply any lotions/deodorants the morning of surgery.  Please wear clean clothes to the hospital/surgery center.  FAILURE TO FOLLOW THESE INSTRUCTIONS MAY RESULT IN THE CANCELLATION OF YOUR SURGERY PATIENT SIGNATURE_________________________________  NURSE SIGNATURE__________________________________  ________________________________________________________________________

## 2016-03-04 ENCOUNTER — Ambulatory Visit (HOSPITAL_COMMUNITY): Payer: Medicare Other | Admitting: Anesthesiology

## 2016-03-04 ENCOUNTER — Encounter (HOSPITAL_COMMUNITY): Payer: Self-pay | Admitting: *Deleted

## 2016-03-04 ENCOUNTER — Encounter (HOSPITAL_COMMUNITY)
Admission: RE | Disposition: A | Discharge status: Home / Self Care | Payer: Self-pay | Source: Ambulatory Visit | Attending: General Surgery

## 2016-03-04 ENCOUNTER — Encounter: Payer: Self-pay | Admitting: Internal Medicine

## 2016-03-04 DIAGNOSIS — I10 Essential (primary) hypertension: Secondary | ICD-10-CM | POA: Diagnosis not present

## 2016-03-04 DIAGNOSIS — K432 Incisional hernia without obstruction or gangrene: Secondary | ICD-10-CM | POA: Diagnosis not present

## 2016-03-04 DIAGNOSIS — I129 Hypertensive chronic kidney disease with stage 1 through stage 4 chronic kidney disease, or unspecified chronic kidney disease: Secondary | ICD-10-CM | POA: Diagnosis not present

## 2016-03-04 DIAGNOSIS — E039 Hypothyroidism, unspecified: Secondary | ICD-10-CM | POA: Diagnosis not present

## 2016-03-04 DIAGNOSIS — Z9889 Other specified postprocedural states: Secondary | ICD-10-CM

## 2016-03-04 DIAGNOSIS — R109 Unspecified abdominal pain: Secondary | ICD-10-CM | POA: Diagnosis not present

## 2016-03-04 DIAGNOSIS — K660 Peritoneal adhesions (postprocedural) (postinfection): Secondary | ICD-10-CM | POA: Diagnosis not present

## 2016-03-04 DIAGNOSIS — Z8719 Personal history of other diseases of the digestive system: Secondary | ICD-10-CM

## 2016-03-04 DIAGNOSIS — Z87891 Personal history of nicotine dependence: Secondary | ICD-10-CM | POA: Diagnosis not present

## 2016-03-04 DIAGNOSIS — N182 Chronic kidney disease, stage 2 (mild): Secondary | ICD-10-CM | POA: Diagnosis not present

## 2016-03-04 DIAGNOSIS — N189 Chronic kidney disease, unspecified: Secondary | ICD-10-CM | POA: Diagnosis not present

## 2016-03-04 DIAGNOSIS — M1990 Unspecified osteoarthritis, unspecified site: Secondary | ICD-10-CM | POA: Diagnosis not present

## 2016-03-04 DIAGNOSIS — J449 Chronic obstructive pulmonary disease, unspecified: Secondary | ICD-10-CM | POA: Diagnosis not present

## 2016-03-04 DIAGNOSIS — F329 Major depressive disorder, single episode, unspecified: Secondary | ICD-10-CM | POA: Diagnosis not present

## 2016-03-04 DIAGNOSIS — Z79899 Other long term (current) drug therapy: Secondary | ICD-10-CM | POA: Diagnosis not present

## 2016-03-04 HISTORY — PX: LAPAROSCOPIC LYSIS OF ADHESIONS: SHX5905

## 2016-03-04 HISTORY — PX: INCISIONAL HERNIA REPAIR: SHX193

## 2016-03-04 SURGERY — REPAIR, HERNIA, INCISIONAL, LAPAROSCOPIC
Anesthesia: Monitor Anesthesia Care | Site: Abdomen

## 2016-03-04 MED ORDER — FENTANYL CITRATE (PF) 100 MCG/2ML IJ SOLN
25.0000 ug | INTRAMUSCULAR | Status: DC | PRN
  Administered 2016-03-04 (×2): 50 ug via INTRAVENOUS

## 2016-03-04 MED ORDER — FENTANYL CITRATE (PF) 100 MCG/2ML IJ SOLN
INTRAMUSCULAR | Status: DC | PRN
  Administered 2016-03-04 (×2): 100 ug via INTRAVENOUS
  Administered 2016-03-04 (×3): 50 ug via INTRAVENOUS

## 2016-03-04 MED ORDER — LIDOCAINE HCL (CARDIAC) 20 MG/ML IV SOLN
INTRAVENOUS | Status: AC
  Filled 2016-03-04: qty 5

## 2016-03-04 MED ORDER — EPHEDRINE SULFATE 50 MG/ML IJ SOLN
INTRAMUSCULAR | Status: DC | PRN
  Administered 2016-03-04 (×2): 10 mg via INTRAVENOUS

## 2016-03-04 MED ORDER — HYDROMORPHONE HCL 1 MG/ML IJ SOLN
INTRAMUSCULAR | Status: AC
  Administered 2016-03-04: 0.5 mg via INTRAVENOUS
  Filled 2016-03-04: qty 1

## 2016-03-04 MED ORDER — ONDANSETRON HCL 4 MG/2ML IJ SOLN
4.0000 mg | Freq: Four times a day (QID) | INTRAMUSCULAR | Status: DC | PRN
  Administered 2016-03-04: 4 mg via INTRAVENOUS
  Filled 2016-03-04: qty 2

## 2016-03-04 MED ORDER — LACTATED RINGERS IV SOLN
INTRAVENOUS | Status: DC
  Administered 2016-03-04: 1000 mL via INTRAVENOUS
  Administered 2016-03-04 (×3): via INTRAVENOUS

## 2016-03-04 MED ORDER — SUGAMMADEX SODIUM 200 MG/2ML IV SOLN
INTRAVENOUS | Status: DC | PRN
  Administered 2016-03-04: 200 mg via INTRAVENOUS

## 2016-03-04 MED ORDER — FENTANYL CITRATE (PF) 100 MCG/2ML IJ SOLN
INTRAMUSCULAR | Status: AC
  Administered 2016-03-04: 50 ug via INTRAVENOUS
  Filled 2016-03-04: qty 2

## 2016-03-04 MED ORDER — PROPOFOL 10 MG/ML IV BOLUS
INTRAVENOUS | Status: AC
  Filled 2016-03-04: qty 20

## 2016-03-04 MED ORDER — OXYCODONE-ACETAMINOPHEN 5-325 MG PO TABS: 1.0000 | ORAL_TABLET | ORAL | Status: DC | PRN

## 2016-03-04 MED ORDER — DEXTROSE-NACL 5-0.9 % IV SOLN
INTRAVENOUS | Status: DC
  Administered 2016-03-04 – 2016-03-05 (×3): via INTRAVENOUS

## 2016-03-04 MED ORDER — METHOCARBAMOL 500 MG PO TABS: 500.0000 mg | ORAL_TABLET | Freq: Four times a day (QID) | ORAL | Status: DC | PRN

## 2016-03-04 MED ORDER — ROCURONIUM BROMIDE 100 MG/10ML IV SOLN
INTRAVENOUS | Status: DC | PRN
  Administered 2016-03-04: 20 mg via INTRAVENOUS
  Administered 2016-03-04: 30 mg via INTRAVENOUS

## 2016-03-04 MED ORDER — OXYCODONE HCL 5 MG PO TABS
5.0000 mg | ORAL_TABLET | ORAL | Status: DC | PRN
  Administered 2016-03-05 – 2016-03-06 (×4): 10 mg via ORAL
  Filled 2016-03-04 (×4): qty 2

## 2016-03-04 MED ORDER — FENTANYL CITRATE (PF) 250 MCG/5ML IJ SOLN
INTRAMUSCULAR | Status: AC
Start: 2016-03-04 — End: 2016-03-04
  Filled 2016-03-04: qty 5

## 2016-03-04 MED ORDER — MIDAZOLAM HCL 5 MG/5ML IJ SOLN
INTRAMUSCULAR | Status: DC | PRN
  Administered 2016-03-04: 2 mg via INTRAVENOUS

## 2016-03-04 MED ORDER — FENTANYL CITRATE (PF) 100 MCG/2ML IJ SOLN
INTRAMUSCULAR | Status: AC
  Filled 2016-03-04: qty 2

## 2016-03-04 MED ORDER — HYDROMORPHONE HCL 1 MG/ML IJ SOLN
0.5000 mg | INTRAMUSCULAR | Status: DC | PRN
  Administered 2016-03-04: 1 mg via INTRAVENOUS
  Administered 2016-03-05: 0.5 mg via INTRAVENOUS
  Filled 2016-03-04 (×2): qty 1

## 2016-03-04 MED ORDER — ENOXAPARIN SODIUM 40 MG/0.4ML ~~LOC~~ SOLN
40.0000 mg | SUBCUTANEOUS | Status: DC
  Filled 2016-03-04 (×2): qty 0.4

## 2016-03-04 MED ORDER — SUGAMMADEX SODIUM 200 MG/2ML IV SOLN
INTRAVENOUS | Status: AC
Start: 2016-03-04 — End: 2016-03-04
  Filled 2016-03-04: qty 2

## 2016-03-04 MED ORDER — HYDROMORPHONE HCL 1 MG/ML IJ SOLN
0.2500 mg | INTRAMUSCULAR | Status: DC | PRN
  Administered 2016-03-04 (×3): 0.5 mg via INTRAVENOUS
  Filled 2016-03-04: qty 1

## 2016-03-04 MED ORDER — ONDANSETRON HCL 4 MG/2ML IJ SOLN
INTRAMUSCULAR | Status: DC | PRN
  Administered 2016-03-04: 4 mg via INTRAVENOUS

## 2016-03-04 MED ORDER — CEFAZOLIN SODIUM-DEXTROSE 2-4 GM/100ML-% IV SOLN
INTRAVENOUS | Status: AC
  Filled 2016-03-04: qty 100

## 2016-03-04 MED ORDER — SUCCINYLCHOLINE CHLORIDE 200 MG/10ML IV SOSY
PREFILLED_SYRINGE | INTRAVENOUS | Status: DC | PRN
Start: 2016-03-04 — End: 2016-03-04
  Administered 2016-03-04: 100 mg via INTRAVENOUS

## 2016-03-04 MED ORDER — LIP MEDEX EX OINT
TOPICAL_OINTMENT | CUTANEOUS | Status: AC
  Filled 2016-03-04: qty 7

## 2016-03-04 MED ORDER — BUPIVACAINE-EPINEPHRINE 0.25% -1:200000 IJ SOLN
INTRAMUSCULAR | Status: DC | PRN
  Administered 2016-03-04: 15 mL

## 2016-03-04 MED ORDER — ZOLPIDEM TARTRATE 5 MG PO TABS: 5.0000 mg | ORAL_TABLET | Freq: Every evening | ORAL | Status: DC | PRN

## 2016-03-04 MED ORDER — ONDANSETRON 4 MG PO TBDP
4.0000 mg | ORAL_TABLET | Freq: Four times a day (QID) | ORAL | Status: DC | PRN
  Filled 2016-03-04: qty 1

## 2016-03-04 MED ORDER — UMECLIDINIUM BROMIDE 62.5 MCG/INH IN AEPB
1.0000 | INHALATION_SPRAY | RESPIRATORY_TRACT | Status: DC
  Administered 2016-03-05: 1 via RESPIRATORY_TRACT
  Filled 2016-03-04: qty 7

## 2016-03-04 MED ORDER — 0.9 % SODIUM CHLORIDE (POUR BTL) OPTIME
TOPICAL | Status: DC | PRN
  Administered 2016-03-04: 1000 mL

## 2016-03-04 MED ORDER — CHLORHEXIDINE GLUCONATE CLOTH 2 % EX PADS: 6.0000 | MEDICATED_PAD | Freq: Once | CUTANEOUS | Status: DC

## 2016-03-04 MED ORDER — CEFAZOLIN SODIUM-DEXTROSE 2-4 GM/100ML-% IV SOLN
2.0000 g | INTRAVENOUS | Status: AC
  Administered 2016-03-04: 2 g via INTRAVENOUS

## 2016-03-04 MED ORDER — LIDOCAINE HCL (CARDIAC) 20 MG/ML IV SOLN
INTRAVENOUS | Status: DC | PRN
  Administered 2016-03-04: 50 mg via INTRAVENOUS

## 2016-03-04 MED ORDER — ONDANSETRON HCL 4 MG/2ML IJ SOLN
INTRAMUSCULAR | Status: AC
Start: 2016-03-04 — End: 2016-03-04
  Filled 2016-03-04: qty 2

## 2016-03-04 MED ORDER — PROPOFOL 10 MG/ML IV BOLUS
INTRAVENOUS | Status: DC | PRN
Start: 2016-03-04 — End: 2016-03-04
  Administered 2016-03-04: 160 mg via INTRAVENOUS

## 2016-03-04 MED ORDER — MIDAZOLAM HCL 2 MG/2ML IJ SOLN
INTRAMUSCULAR | Status: AC
Start: 2016-03-04 — End: 2016-03-04
  Filled 2016-03-04: qty 2

## 2016-03-04 MED ORDER — BUPIVACAINE-EPINEPHRINE 0.25% -1:200000 IJ SOLN
INTRAMUSCULAR | Status: AC
Start: 2016-03-04 — End: 2016-03-04
  Filled 2016-03-04: qty 1

## 2016-03-04 MED ORDER — PHENYLEPHRINE 40 MCG/ML (10ML) SYRINGE FOR IV PUSH (FOR BLOOD PRESSURE SUPPORT)
PREFILLED_SYRINGE | INTRAVENOUS | Status: DC | PRN
  Administered 2016-03-04: 80 ug via INTRAVENOUS
  Administered 2016-03-04: 120 ug via INTRAVENOUS
  Administered 2016-03-04: 80 ug via INTRAVENOUS

## 2016-03-04 MED ORDER — LEVOTHYROXINE SODIUM 112 MCG PO TABS
112.0000 ug | ORAL_TABLET | Freq: Every day | ORAL | Status: DC
  Administered 2016-03-05 – 2016-03-06 (×2): 112 ug via ORAL
  Filled 2016-03-04 (×4): qty 1

## 2016-03-04 MED ORDER — ACETAMINOPHEN 10 MG/ML IV SOLN
1000.0000 mg | Freq: Once | INTRAVENOUS | Status: AC
  Administered 2016-03-04: 1000 mg via INTRAVENOUS
  Filled 2016-03-04: qty 100

## 2016-03-04 SURGICAL SUPPLY — 38 items
APPLIER CLIP 5 13 M/L LIGAMAX5 (MISCELLANEOUS)
BENZOIN TINCTURE PRP APPL 2/3 (GAUZE/BANDAGES/DRESSINGS) ×2 IMPLANT
BINDER ABDOMINAL 12 ML 46-62 (SOFTGOODS) ×2 IMPLANT
CABLE HIGH FREQUENCY MONO STRZ (ELECTRODE) ×2 IMPLANT
CLIP APPLIE 5 13 M/L LIGAMAX5 (MISCELLANEOUS) IMPLANT
COVER SURGICAL LIGHT HANDLE (MISCELLANEOUS) ×2 IMPLANT
DECANTER SPIKE VIAL GLASS SM (MISCELLANEOUS) ×2 IMPLANT
DEVICE SECURE STRAP 25 ABSORB (INSTRUMENTS) ×2 IMPLANT
DEVICE TROCAR PUNCTURE CLOSURE (ENDOMECHANICALS) ×2 IMPLANT
DRAPE LAPAROSCOPIC ABDOMINAL (DRAPES) ×2 IMPLANT
ELECT REM PT RETURN 9FT ADLT (ELECTROSURGICAL) ×2
ELECTRODE REM PT RTRN 9FT ADLT (ELECTROSURGICAL) ×1 IMPLANT
GAUZE SPONGE 2X2 8PLY STRL LF (GAUZE/BANDAGES/DRESSINGS) ×1 IMPLANT
GLOVE BIO SURGEON STRL SZ7.5 (GLOVE) ×4 IMPLANT
GOWN STRL REUS W/TWL XL LVL3 (GOWN DISPOSABLE) ×8 IMPLANT
KIT BASIN OR (CUSTOM PROCEDURE TRAY) ×2 IMPLANT
MARKER SKIN DUAL TIP RULER LAB (MISCELLANEOUS) ×2 IMPLANT
MESH VENTRALIGHT ST 6IN CRC (Mesh General) ×2 IMPLANT
NEEDLE INSUFFLATION 14GA 120MM (NEEDLE) ×2 IMPLANT
NEEDLE SPNL 22GX3.5 QUINCKE BK (NEEDLE) ×2 IMPLANT
PAD POSITIONING PINK XL (MISCELLANEOUS) IMPLANT
POSITIONER SURGICAL ARM (MISCELLANEOUS) IMPLANT
SCISSORS LAP 5X35 DISP (ENDOMECHANICALS) ×2 IMPLANT
SET IRRIG TUBING LAPAROSCOPIC (IRRIGATION / IRRIGATOR) ×2 IMPLANT
SHEARS HARMONIC ACE PLUS 36CM (ENDOMECHANICALS) IMPLANT
SLEEVE XCEL OPT CAN 5 100 (ENDOMECHANICALS) ×4 IMPLANT
SPONGE GAUZE 2X2 STER 10/PKG (GAUZE/BANDAGES/DRESSINGS) ×1
STRIP CLOSURE SKIN 1/2X4 (GAUZE/BANDAGES/DRESSINGS) ×2 IMPLANT
SUT CHROMIC 2 0 SH (SUTURE) ×2 IMPLANT
SUT MNCRL AB 4-0 PS2 18 (SUTURE) ×2 IMPLANT
SUT NOVA NAB DX-16 0-1 5-0 T12 (SUTURE) ×2 IMPLANT
TAPE CLOTH 4X10 WHT NS (GAUZE/BANDAGES/DRESSINGS) IMPLANT
TOWEL OR 17X26 10 PK STRL BLUE (TOWEL DISPOSABLE) ×2 IMPLANT
TOWEL OR NON WOVEN STRL DISP B (DISPOSABLE) ×2 IMPLANT
TRAY LAPAROSCOPIC (CUSTOM PROCEDURE TRAY) ×2 IMPLANT
TROCAR BLADELESS OPT 5 100 (ENDOMECHANICALS) ×2 IMPLANT
TROCAR XCEL 12X100 BLDLESS (ENDOMECHANICALS) IMPLANT
TUBING INSUF HEATED (TUBING) ×2 IMPLANT

## 2016-03-04 NOTE — Anesthesia Postprocedure Evaluation (Signed)
Anesthesia Post Note  Patient: Valerie Jackson  Procedure(s) Performed: Procedure(s) (LRB): LAPAROSCOPIC INCISIONAL HERNIA WITH MESH (N/A) LAPAROSCOPIC LYSIS OF ADHESIONS (N/A)  Patient location during evaluation: PACU Anesthesia Type: General Level of consciousness: awake Pain management: pain level controlled Vital Signs Assessment: post-procedure vital signs reviewed and stable Respiratory status: spontaneous breathing Anesthetic complications: no    Last Vitals:  Filed Vitals:   03/04/16 1458 03/04/16 1615  BP: 137/82 126/83  Pulse: 76 76  Temp: 36.4 C   Resp: 16 16    Last Pain:  Filed Vitals:   03/04/16 1638  PainSc: 3                  EDWARDS,Kierria Feigenbaum

## 2016-03-04 NOTE — Anesthesia Preprocedure Evaluation (Signed)
Anesthesia Evaluation  Patient identified by MRN, date of birth, ID band Patient awake    Reviewed: Allergy & Precautions, NPO status , Patient's Chart, lab work & pertinent test results  Airway Mallampati: II  TM Distance: >3 FB Neck ROM: Full    Dental   Pulmonary shortness of breath, pneumonia, COPD, former smoker,    breath sounds clear to auscultation       Cardiovascular hypertension, + DOE  + dysrhythmias + Valvular Problems/Murmurs  Rhythm:Regular Rate:Normal     Neuro/Psych    GI/Hepatic hiatal hernia, GERD  ,(+) Hepatitis -  Endo/Other  Hypothyroidism   Renal/GU Renal disease     Musculoskeletal   Abdominal   Peds  Hematology   Anesthesia Other Findings   Reproductive/Obstetrics                             Anesthesia Physical Anesthesia Plan  ASA: III  Anesthesia Plan: MAC   Post-op Pain Management:    Induction: Intravenous  Airway Management Planned: Simple Face Mask  Additional Equipment:   Intra-op Plan:   Post-operative Plan:   Informed Consent: I have reviewed the patients History and Physical, chart, labs and discussed the procedure including the risks, benefits and alternatives for the proposed anesthesia with the patient or authorized representative who has indicated his/her understanding and acceptance.   Dental advisory given  Plan Discussed with: CRNA and Anesthesiologist  Anesthesia Plan Comments:         Anesthesia Quick Evaluation

## 2016-03-04 NOTE — Transfer of Care (Signed)
Immediate Anesthesia Transfer of Care Note  Patient: Valerie Jackson  Procedure(s) Performed: Procedure(s): LAPAROSCOPIC INCISIONAL HERNIA WITH MESH (N/A) LAPAROSCOPIC LYSIS OF ADHESIONS (N/A)  Patient Location: PACU  Anesthesia Type:General  Level of Consciousness: sedated, patient cooperative and responds to stimulation  Airway & Oxygen Therapy: Patient Spontanous Breathing and Patient connected to face mask oxygen  Post-op Assessment: Report given to RN and Post -op Vital signs reviewed and stable  Post vital signs: Reviewed and stable  Last Vitals:  Filed Vitals:   03/04/16 0946  BP: 114/77  Pulse: 73  Temp: 36.5 C  Resp: 18    Last Pain: There were no vitals filed for this visit.    Patients Stated Pain Goal: 4 (21/30/86 5784)  Complications: No apparent anesthesia complications

## 2016-03-04 NOTE — Op Note (Signed)
03/04/2016  1:11 PM  PATIENT:  Valerie Jackson  70 y.o. female  PRE-OPERATIVE DIAGNOSIS:  Incisional hernia  POST-OPERATIVE DIAGNOSIS:  3.5cm Incisional hernia  PROCEDURE:  Procedure(s): LAPAROSCOPIC INCISIONAL HERNIA WITH MESH (N/A) LAPAROSCOPIC LYSIS OF ADHESIONS x 22mn (N/A)  SURGEON:  Surgeon(s) and Role:    * ARalene Ok MD - Primary   ANESTHESIA:   local and general  EBL:  Total I/O In: 2000 [I.V.:2000] Out: 100 [Urine:50; Blood:50]  BLOOD ADMINISTERED:none  DRAINS: none   LOCAL MEDICATIONS USED:  BUPIVICAINE   SPECIMEN:  No Specimen  DISPOSITION OF SPECIMEN:  N/A  COUNTS:  YES  TOURNIQUET:  * No tourniquets in log *  DICTATION: .Dragon Dictation  Details of the procedure:   After the patient was consented patient was taken back to the operating room patient was then placed in supine position bilateral SCDs in place.  The patient was prepped and draped in the usual sterile fashion. After antibiotics were confirmed a timeout was called and all facts were verified. The Veress needle technique was used to insuflate the abdomen at Palmer's point. The abdomen was insufflated to 14 mm mercury. Subsequently a 5 mm trocar was placed a camera inserted there was no injury to any intra-abdominal organs.  A second camera port was in placed into the left lower quadrant.   At this the Falicform ligament was taken down with Bovie cautery maintaining hemostasis. A 570mport was placed in the epigastrium .   There was seen to be large amount of flimsy omental adhesions to the anterior abdominal wall.  These were taken down with sharp and blunt dissection.  There was also a portion of the transverse colon within the adhesions.  There was no apparent injury to the colon as this was taken down.  Once the adhesions were taken down a unincarcerated  3.5cm incisional hernia could easily be seen.    Once the hernia was cleared away circumfrentially, #1 Novalfill suture were used to  reapproximate the fascia with an endoclose device x 4.   At this time a Bard Ventralight 15.2cm  mesh was inserted into the abdomen.  The mesh was secured circumferentially with am Securestrap tacker in a double crown fashion.   The omentum was brought over the area of the mesh. The pneumoperitoneum was evacuated  & all trocars  were removed. The skin was reapproximated with 4-0  Monocryl sutures in a subcuticular fashion. The skin was dressed with Steri-Strips tape and gauze.  The patient was taken to the recovery room in stable condition.  PLAN OF CARE: Discharge to home after PACU  PATIENT DISPOSITION:  PACU - hemodynamically stable.   Delay start of Pharmacological VTE agent (>24hrs) due to surgical blood loss or risk of bleeding: not applicable

## 2016-03-04 NOTE — Progress Notes (Signed)
Called report to [employer] on [employer].

## 2016-03-04 NOTE — Progress Notes (Signed)
Pt oxygen levels 89-90% on RA.Marland KitchenMarland KitchenPain level 7/10. Pt voiced to stay overnight.. Dr. Lady Gary notified and order for observation overnight given.

## 2016-03-04 NOTE — Discharge Instructions (Signed)
[facility] _______[facility]   HERNIA REPAIR: POST OP INSTRUCTIONS  Always review your discharge instruction sheet given to you by the facility where your surgery was performed. IF YOU HAVE DISABILITY OR FAMILY LEAVE FORMS, YOU MUST BRING THEM TO THE OFFICE FOR PROCESSING.   DO NOT GIVE THEM TO YOUR DOCTOR.  1. A  prescription for pain medication may be given to you upon discharge.  Take your pain medication as prescribed, if needed.  If narcotic pain medicine is not needed, then you may take acetaminophen (Tylenol) or ibuprofen (Advil) as needed. 2. Take your usually prescribed medications unless otherwise directed. 3. If you need a refill on your pain medication, please contact your pharmacy.  They will contact our office to request authorization. Prescriptions will not be filled after 5 pm or on week-ends. 4. You should follow a light diet the first 24 hours after arrival home, such as soup and crackers, etc.  Be sure to include lots of fluids daily.  Resume your normal diet the day after surgery. 5. Most patients will experience some swelling and bruising around the umbilicus or in the groin and scrotum.  Ice packs and reclining will help.  Swelling and bruising can take several days to resolve.  6. It is common to experience some constipation if taking pain medication after surgery.  Increasing fluid intake and taking a stool softener (such as Colace) will usually help or prevent this problem from occurring.  A mild laxative (Milk of Magnesia or Miralax) should be taken according to package directions if there are no bowel movements after 48 hours. 7. Unless discharge instructions indicate otherwise, you may remove your bandages 24-48 hours after surgery, and you may shower at that time.  You may have steri-strips (small skin tapes) in place directly over the incision.  These strips should be left on the skin for 7-10 days.  If your surgeon used skin glue on the incision, you may  shower in 24 hours.  The glue will flake off over the next 2-3 weeks.  Any sutures or staples will be removed at the office during your follow-up visit. 8. ACTIVITIES:  You may resume regular (light) daily activities beginning the next day--such as daily self-care, walking, climbing stairs--gradually increasing activities as tolerated.  You may have sexual intercourse when it is comfortable.  Refrain from any heavy lifting or straining until approved by your doctor. a. You may drive when you are no longer taking prescription pain medication, you can comfortably wear a seatbelt, and you can safely maneuver your car and apply brakes. b. RETURN TO WORK:  __________________________________________________________ 9. You should see your doctor in the office for a follow-up appointment approximately 2-3 weeks after your surgery.  Make sure that you call for this appointment within a day or two after you arrive home to insure a convenient appointment time. 10. OTHER INSTRUCTIONS:  __________________________________________________________________________________________________________________________________________________________________________________________  WHEN TO CALL YOUR DOCTOR: 1. Fever over 101.0 2. Inability to urinate 3. Nausea and/or vomiting 4. Extreme swelling or bruising 5. Continued bleeding from incision. 6. Increased pain, redness, or drainage from the incision  The clinic staff is available to answer your questions during regular business hours.  Please dont hesitate to call and ask to speak to one of the nurses for clinical concerns.  If you have a medical emergency, go to the nearest emergency room or call 911.  A surgeon from [facility] is always on call at the hospital   [address]  [address] [place], [place], [place]  [address] ?  [address], [place], [place]   [address] [phone] ? [phone] ? FAX [phone] Web site:  [email or web address]     General Anesthesia, Adult, Care After Refer to this sheet in the next few weeks. These instructions provide you with information on caring for yourself after your procedure. Your health care provider may also give you more specific instructions. Your treatment has been planned according to current medical practices, but problems sometimes occur. Call your health care provider if you have any problems or questions after your procedure. WHAT TO EXPECT AFTER THE PROCEDURE After the procedure, it is typical to experience:  Sleepiness.  Nausea and vomiting. HOME CARE INSTRUCTIONS  For the first 24 hours after general anesthesia:  Have a responsible person with you.  Do not drive a car. If you are alone, do not take public transportation.  Do not drink alcohol.  Do not take medicine that has not been prescribed by your health care provider.  Do not sign important papers or make important decisions.  You may resume a normal diet and activities as directed by your health care provider.  Change bandages (dressings) as directed.  If you have questions or problems that seem related to general anesthesia, call the hospital and ask for the anesthetist or anesthesiologist on call. SEEK MEDICAL CARE IF:  You have nausea and vomiting that continue the day after anesthesia.  You develop a rash. SEEK IMMEDIATE MEDICAL CARE IF:   You have difficulty breathing.  You have chest pain.  You have any allergic problems.   This information is not intended to replace advice given to you by your health care provider. Make sure you discuss any questions you have with your health care provider.   Document Released: 12/07/2000 Document Revised: 09/21/2014 Document Reviewed: 12/30/2011 Elsevier Interactive Patient Education [employer].

## 2016-03-04 NOTE — Anesthesia Procedure Notes (Signed)
Procedure Name: Intubation Date/Time: 03/04/2016 11:44 AM Performed by: Hiba Garry, Virgel Gess Pre-anesthesia Checklist: Patient identified, Emergency Drugs available, Suction available, Patient being monitored and Timeout performed Patient Re-evaluated:Patient Re-evaluated prior to inductionOxygen Delivery Method: Circle system utilized Preoxygenation: Pre-oxygenation with 100% oxygen Intubation Type: IV induction Ventilation: Mask ventilation without difficulty Laryngoscope Size: Mac and 4 Grade View: Grade I Tube type: Oral Tube size: 7.5 mm Number of attempts: 1 Airway Equipment and Method: Stylet Placement Confirmation: ETT inserted through vocal cords under direct vision,  positive ETCO2,  CO2 detector and breath sounds checked- equal and bilateral Secured at: 22 cm Tube secured with: Tape Dental Injury: Teeth and Oropharynx as per pre-operative assessment

## 2016-03-05 ENCOUNTER — Encounter (HOSPITAL_COMMUNITY): Payer: Self-pay | Admitting: General Surgery

## 2016-03-05 DIAGNOSIS — K432 Incisional hernia without obstruction or gangrene: Secondary | ICD-10-CM | POA: Diagnosis not present

## 2016-03-05 DIAGNOSIS — K660 Peritoneal adhesions (postprocedural) (postinfection): Secondary | ICD-10-CM | POA: Diagnosis not present

## 2016-03-05 DIAGNOSIS — E039 Hypothyroidism, unspecified: Secondary | ICD-10-CM | POA: Diagnosis not present

## 2016-03-05 DIAGNOSIS — R109 Unspecified abdominal pain: Secondary | ICD-10-CM | POA: Diagnosis not present

## 2016-03-05 DIAGNOSIS — J449 Chronic obstructive pulmonary disease, unspecified: Secondary | ICD-10-CM | POA: Diagnosis not present

## 2016-03-05 DIAGNOSIS — I10 Essential (primary) hypertension: Secondary | ICD-10-CM | POA: Diagnosis not present

## 2016-03-05 MED ORDER — METOCLOPRAMIDE HCL 5 MG/ML IJ SOLN
10.0000 mg | Freq: Four times a day (QID) | INTRAMUSCULAR | Status: DC
  Administered 2016-03-05 – 2016-03-06 (×3): 10 mg via INTRAVENOUS
  Filled 2016-03-05 (×4): qty 2

## 2016-03-05 NOTE — Progress Notes (Signed)
1 Day Post-Op  Subjective: Pt with con't abd pain.  Concerned about being [place] with min help  Objective: Vital signs in last 24 hours: Temp:  [96.8 F (36 C)-97.7 F (36.5 C)] 97.5 F (36.4 C) (06/22 0417) Pulse Rate:  [72-88] 78 (06/22 0417) Resp:  [10-18] 16 (06/22 0417) BP: (114-153)/(72-102) 151/78 mmHg (06/22 0417) SpO2:  [89 %-99 %] 99 % (06/22 0417) Weight:  [89.812 kg (198 lb)-96.4 kg (212 lb 8.4 oz)] 96.4 kg (212 lb 8.4 oz) (06/21 1744) Last BM Date: 03/03/16  Intake/Output from previous day: 06/21 0701 - 06/22 0700 In: 3253 [I.V.:3253] Out: 1850 [Urine:1800; Blood:50] Intake/Output this shift:    General appearance: alert and cooperative GI: soft, approp ttp, ND  Lab Results:   Recent Labs  03/03/16 1530  WBC 8.3  HGB 13.2  HCT 39.4  PLT 249   BMET  Recent Labs  03/03/16 1530  NA 140  K 3.9  CL 106  CO2 26  GLUCOSE 114*  BUN 24*  CREATININE 1.50*  CALCIUM 9.9   PT/INR  Recent Labs  03/03/16 1530  LABPROT 14.4  INR 1.10    Assessment/Plan: s/p Procedure(s): LAPAROSCOPIC INCISIONAL HERNIA WITH MESH (N/A) LAPAROSCOPIC LYSIS OF ADHESIONS (N/A) Advance diet as tol Will add Reglan for nausea Would encourage PO pain meds PT for ambulation assistance Plan for home tomorrow     Rosario Jacks., Anne Hahn 03/05/2016

## 2016-03-05 NOTE — Care Management Note (Signed)
Case Management Note  Patient Details  Name: SHABREA WELDIN MRN: [id number] Date of Birth: 1945/10/25  Subjective/Objective:         70 yo admitted for Hernia repair           Action/Plan: From home with daughter.  PT recommendations are for HHPT and RW.  Pt states she already has a RW at home.  Pt offered choice for [facility] services and chose [facility].  [facility] rep contacted for referral. Will need MD order for HHPT at discharge. No other DC needs communicated.  Expected Discharge Date:  03/05/16               Expected Discharge Plan:  [place]  In-House Referral:     Discharge planning Services  CM Consult  Post Acute Care Choice:  Home Health Choice offered to:  Patient  DME Arranged:    DME Agency:     HH Arranged:  PT [place]:  [place]  Status of Service:  In process, will continue to follow  If discussed at Long Length of Stay Meetings, dates discussed:    Additional CommentsLynnell Catalan, RN 03/05/2016, 2:37 PM  [phone]

## 2016-03-05 NOTE — Evaluation (Addendum)
Physical Therapy Evaluation Patient Details Name: SHARNELL KNIGHT MRN: [id number] DOB: [birth year] Today's Date: 03/05/2016   History of Present Illness  70 yo female s/p lap hernia repair with mesh 03/04/16.   Clinical Impression  On eval, pt required Mod assist for mobility-walked ~10 feet with RW. Pt c/o pain and feeling weak/dizzy so limited ambulation distance this session. Pt is not very motivated to mobilize. Recommend daily OOB to chair/ambulation with nursing supervision and RW. Will follow and progress activity as able.     Follow Up Recommendations Home health PT;Supervision/Assistance - 24 hour     Equipment Recommendations   Rolling Walker   Recommendations for Other Services       Precautions / Restrictions Precautions Precautions: Fall Precaution Comments: abd surgery Restrictions Weight Bearing Restrictions: No      Mobility  Bed Mobility Overal bed mobility: Needs Assistance Bed Mobility: Rolling;Sidelying to Sit Rolling: Min assist Sidelying to sit: Mod assist;HOB elevated       General bed mobility comments: Assist for trunk and scoot to EOB. Increased time. VCs for technique, hand placement.   Transfers Overall transfer level: Needs assistance Equipment used: Rolling walker (2 wheeled) Transfers: Sit to/from Stand Sit to Stand: Min assist;From elevated surface         General transfer comment: Assist to rise, stabilize, control descent. Pt c/o dizziness so stood at side of bed for several minutes.   Ambulation/Gait Ambulation/Gait assistance: Min assist;+2 safety/equipment Ambulation Distance (Feet): 10 Feet Assistive device: Rolling walker (2 wheeled) Gait Pattern/deviations: Step-through pattern;Decreased stride length     General Gait Details: Assist to stabilize pt. Very slow gait speed. Pt c/o feeling weak so assisted into recliner.   Stairs            Wheelchair Mobility    Modified Rankin (Stroke Patients Only)        Balance                                             Pertinent Vitals/Pain Pain Assessment: 0-10 Pain Score: 8  Pain Location: abdomen Pain Descriptors / Indicators: Sore;Guarding;Grimacing Pain Intervention(s): Limited activity within patient's tolerance;Monitored during session;Repositioned    Home Living Family/patient expects to be discharged to:: Private residence Living Arrangements: Children Available Help at Discharge: Family Type of Home: House Home Access: Stairs to enter Entrance Stairs-Rails: None Technical brewer of Steps: 1 Home Layout: Two level;Bed/bath upstairs Home Equipment: None      Prior Function Level of Independence: Independent               Hand Dominance        Extremity/Trunk Assessment   Upper Extremity Assessment: Generalized weakness           Lower Extremity Assessment: Generalized weakness      Cervical / Trunk Assessment: Normal  Communication   Communication: No difficulties  Cognition Arousal/Alertness: Awake/alert Behavior During Therapy: WFL for tasks assessed/performed Overall Cognitive Status: Within Functional Limits for tasks assessed                      General Comments      Exercises        Assessment/Plan    PT Assessment Patient needs continued PT services  PT Diagnosis Difficulty walking;Generalized weakness;Acute pain   PT Problem List Decreased strength;Decreased activity tolerance;Decreased balance;Decreased mobility;Obesity;Decreased knowledge of  use of DME;Pain  PT Treatment Interventions DME instruction;Gait training;Functional mobility training;Therapeutic activities;Patient/family education;Balance training;Therapeutic exercise   PT Goals (Current goals can be found in the Care Plan section) Acute Rehab PT Goals Patient Stated Goal: less pain PT Goal Formulation: With patient Time For Goal Achievement: 03/19/16 Potential to Achieve Goals: Good     Frequency Min 3X/week   Barriers to discharge        Co-evaluation               End of Session   Activity Tolerance: Patient limited by pain;Patient limited by fatigue Patient left: in chair;with call bell/phone within reach;with family/visitor present      Functional Assessment Tool Used: clinical judgement Functional Limitation: Mobility: Walking and moving around Mobility: Walking and Moving Around Current Status ([id number]): At least 20 percent but less than 40 percent impaired, limited or restricted Mobility: Walking and Moving Around Goal Status [phone]): At least 1 percent but less than 20 percent impaired, limited or restricted    Time: 0942-1001 PT Time Calculation (min) (ACUTE ONLY): 19 min   Charges:   PT Evaluation $PT Eval Low Complexity: 1 Procedure     PT G Codes:   PT G-Codes **NOT FOR INPATIENT CLASS** Functional Assessment Tool Used: clinical judgement Functional Limitation: Mobility: Walking and moving around Mobility: Walking and Moving Around Current Status (H6808): At least 20 percent but less than 40 percent impaired, limited or restricted Mobility: Walking and Moving Around Goal Status [phone]): At least 1 percent but less than 20 percent impaired, limited or restricted    Weston Anna, MPT Pager: [phone]

## 2016-03-05 NOTE — Care Management Obs Status (Signed)
[place] NOTIFICATION   Patient Details  Name: Valerie Jackson MRN: [id number] Date of Birth: 1946-05-29   Medicare Observation Status Notification Given:  Yes    Lynnell Catalan, RN 03/05/2016, 2:25 PM

## 2016-03-06 ENCOUNTER — Telehealth: Payer: Self-pay | Admitting: *Deleted

## 2016-03-06 DIAGNOSIS — K660 Peritoneal adhesions (postprocedural) (postinfection): Secondary | ICD-10-CM | POA: Diagnosis not present

## 2016-03-06 DIAGNOSIS — E039 Hypothyroidism, unspecified: Secondary | ICD-10-CM | POA: Diagnosis not present

## 2016-03-06 DIAGNOSIS — R109 Unspecified abdominal pain: Secondary | ICD-10-CM | POA: Diagnosis not present

## 2016-03-06 DIAGNOSIS — K432 Incisional hernia without obstruction or gangrene: Secondary | ICD-10-CM | POA: Diagnosis not present

## 2016-03-06 DIAGNOSIS — I10 Essential (primary) hypertension: Secondary | ICD-10-CM | POA: Diagnosis not present

## 2016-03-06 DIAGNOSIS — J449 Chronic obstructive pulmonary disease, unspecified: Secondary | ICD-10-CM | POA: Diagnosis not present

## 2016-03-06 NOTE — Telephone Encounter (Signed)
Pt was on TCM list admitted for surgery had s/p hernia repair, increase pain post op. D/c 6/23, will f/u w/Dr. Rosendo Gros in 2 wks...Johny Chess

## 2016-03-06 NOTE — Discharge Summary (Signed)
Physician Discharge Summary  Patient ID: Valerie Jackson MRN: [id number] DOB/AGE: 12-27-1945 70 y.o.  Admit date: 03/04/2016 Discharge date: 03/06/2016  Admission Diagnoses:s/p hernia repair, addmited for pain post op  Discharge Diagnoses:  Active Problems:   S/P laparoscopic surgery   S/P hernia repair   Discharged Condition: good  Hospital Course: Post op pt was admitted 2/2 to pain.  Pain was slowly controlled with PO and IV pain Rx.  She ambulated with Pt.  She tol reg diet.  She was o/w afebrile and deemed stable for DC and Dc'd home  Consults: None  Significant Diagnostic Studies: none  Treatments: surgery: as above  Discharge Exam: Blood pressure 137/93, pulse 85, temperature 98.9 F (37.2 C), temperature source Oral, resp. rate 18, height 5' 6"  (1.676 m), weight 96.4 kg (212 lb 8.4 oz), SpO2 95 %. General appearance: alert and cooperative GI: soft, non-tender; bowel sounds normal; no masses,  no organomegaly and incision c/d/i  Disposition: 01-Home or Self Care  Discharge Instructions    Diet - low sodium heart healthy    Complete by:  As directed      Diet - low sodium heart healthy    Complete by:  As directed      Face-to-face encounter (required for Medicare/Medicaid patients)    Complete by:  As directed   I Rosario Jacks., Anne Hahn certify that this patient is under my care and that I, or a nurse practitioner or physician's assistant working with me, had a face-to-face encounter that meets the physician face-to-face encounter requirements with this patient on 03/06/2016. The encounter with the patient was in whole, or in part for the following medical condition(s) which is the primary reason for home health care (List medical condition): s/p hernia repair  The encounter with the patient was in whole, or in part, for the following medical condition, which is the primary reason for home health care:  s.p hernia repair  I certify that, based on my findings, the following  services are medically necessary home health services:  Physical therapy  Reason for Medically Necessary Home Health Services:  Therapy- Home Adaptation to Facilitate Safety  My clinical findings support the need for the above services:  Unsafe ambulation due to balance issues  Further, I certify that my clinical findings support that this patient is homebound due to:  Pain interferes with ambulation/mobility     Home Health    Complete by:  As directed   To provide the following care/treatments:  PT     Increase activity slowly    Complete by:  As directed      Increase activity slowly    Complete by:  As directed             Medication List    TAKE these medications        calcium carbonate 1250 (500 Ca) MG tablet  Commonly known as:  OS-CAL - dosed in mg of elemental calcium  Take 1 tablet by mouth daily with breakfast.     chlorthalidone 25 MG tablet  Commonly known as:  HYGROTON  Take 1 tablet (25 mg total) by mouth daily.     cholecalciferol 1000 units tablet  Commonly known as:  VITAMIN D  Take 1,000 Units by mouth daily.     clobetasol cream 0.05 %  Commonly known as:  TEMOVATE  Apply 1 application topically 2 (two) times daily. Apply BID for one week then once weekly     levothyroxine 112  MCG tablet  Commonly known as:  SYNTHROID, LEVOTHROID  Take 1 tablet (112 mcg total) by mouth daily.     oxyCODONE-acetaminophen 5-325 MG tablet  Commonly known as:  ROXICET  Take 1-2 tablets by mouth every 4 (four) hours as needed.     umeclidinium bromide 62.5 MCG/INH Aepb  Commonly known as:  INCRUSE ELLIPTA  Inhale 1 puff into the lungs daily.     zolpidem 5 MG tablet  Commonly known as:  AMBIEN  Take 1 tablet (5 mg total) by mouth at bedtime as needed for sleep.           Follow-up Information    Follow up with Reyes Ivan, MD. Schedule an appointment as soon as possible for a visit in 2 weeks.   Specialty:  General Surgery   Why:  For wound re-check    Contact information:   [address] [place] [address] [phone]       Follow up with [facility].   Contact information:   [place] [address] [phone]       Signed: Rosario Jacks., Anne Hahn 03/06/2016, 7:29 AM

## 2016-03-06 NOTE — Progress Notes (Signed)
Discharge instructions and prescriptions given, verbalized understanding. All questions answered appropriately.

## 2016-03-06 NOTE — Progress Notes (Signed)
Patient d/c home,stable. 

## 2016-03-12 ENCOUNTER — Telehealth: Payer: Self-pay | Admitting: Internal Medicine

## 2016-03-12 NOTE — Telephone Encounter (Signed)
Seen patient for PT evaluation after surgery.  States patient really does not need PT.  Patient has a surgical incision but did not get to look at.  Patient is not taking BP meds as prescribed to take because bp dropped.  States has dropped to 150 to 118 after doing steps.  Did states patient does walk around.  Did inform patient to make follow up with pcp.  Also states patient has a script for pain meds but is not taking them.  Thinks patient could benefit from pulmonary rehab because patient has COPD.  Informed patient to follow up with pcp in regard to this.

## 2016-03-12 NOTE — Telephone Encounter (Signed)
msg from [facility]

## 2016-03-12 NOTE — Telephone Encounter (Signed)
She can stop the BP med

## 2016-04-03 ENCOUNTER — Ambulatory Visit (INDEPENDENT_AMBULATORY_CARE_PROVIDER_SITE_OTHER): Payer: Medicare Other | Admitting: Endocrinology

## 2016-04-03 ENCOUNTER — Encounter: Payer: Self-pay | Admitting: Endocrinology

## 2016-04-03 LAB — VITAMIN D 25 HYDROXY (VIT D DEFICIENCY, FRACTURES): VITD: 38.3 ng/mL (ref 30.00–100.00)

## 2016-04-03 NOTE — Progress Notes (Signed)
Pre visit review using our clinic review tool, if applicable. No additional management support is needed unless otherwise documented below in the visit note. 

## 2016-04-03 NOTE — Progress Notes (Signed)
Subjective:    Patient ID: Valerie Jackson, female    DOB: 10/08/1945, 70 y.o.   MRN: [id number]  HPI Pt was noted to have mild hypercalcemia in early 2017 (it was normal in 2016). she has never had osteoporosis, urolithiasis, sarcoidosis, cancer, PUD, pancreatitis, or bony fracture.  He does not take A supplement.  Pt has no h/o any of these: [place], or prolonged immobilization.  Pt denies taking antacids or Li++.  She has moderate cramps in the legs, and assoc pain.  She takes low-dose vitamin-D. Past Medical History:  Diagnosis Date  . Arthritis    fingers  . Blood clot in vein 1968  . Cataracts, bilateral   . Chronic kidney disease    CKD stage 2  . COPD (chronic obstructive pulmonary disease) ([place])   . Depression   . Diverticulitis 09/2013   with perforation  . Diverticulitis of colon with perforation 09/28/2013  . Dizziness    with decreased BP readings or bending over  . Dysrhythmia   . Floaters   . GERD (gastroesophageal reflux disease)   . H/O hiatal hernia   . Headache(784.0)    migraine every 5- to 6 years  . Heart murmur since age 69   mild  . Hepatitis age 85 or 59   had heapatitis, not sure which kind  . History of blood transfusion 1965  . History of frequent urinary tract infections   . Hypercholesteremia   . Hypertension   . Hypokalemia   . Hypothyroid   . Obesity   . Pericolonic abscess 2015  . Pneumonia   . Shortness of breath dyspnea   . Sickle cell trait ([place])   . Wears glasses     Past Surgical History:  Procedure Laterality Date  . CHOLECYSTECTOMY  2002  . COLON RESECTION N/A 10/11/2013   Procedure: EXPLORATORY LAPAROTOMY WITH RECTOSIGMOID COLECTOMY AND END COLOSTOMY;  Surgeon: Stark Klein, MD;  Location: [facility];  Service: General;  Laterality: N/A;  . COLOSTOMY TAKEDOWN N/A 02/27/2014   Procedure: COLOSTOMY TAKEDOWN [name] HERNIA REPAIR;  Surgeon: Stark Klein, MD;  Location: [facility];  Service: General;  Laterality: N/A;  . INCISIONAL HERNIA  REPAIR N/A 03/04/2016   Procedure: LAPAROSCOPIC INCISIONAL HERNIA WITH MESH;  Surgeon: Ralene Ok, MD;  Location: [facility];  Service: General;  Laterality: N/A;  . LAPAROSCOPIC LYSIS OF ADHESIONS N/A 03/04/2016   Procedure: LAPAROSCOPIC LYSIS OF ADHESIONS;  Surgeon: Ralene Ok, MD;  Location: [facility];  Service: General;  Laterality: N/A;  . TONSILLECTOMY  1952    Social History   Social History  . Marital status: Divorced    Spouse name: N/A  . Number of children: 1  . Years of education: 27   Occupational History  . Retired     Therapist, sports   Social History Main Topics  . Smoking status: Former Smoker    Packs/day: 0.50    Years: 50.00    Types: Cigarettes    Quit date: 09/06/2012  . Smokeless tobacco: Never Used  . Alcohol use No  . Drug use: No  . Sexual activity: Not Currently   Other Topics Concern  . Not on file   Social History Narrative   Regular exercise-no   Caffeine Use-yes    Current Outpatient Prescriptions on File Prior to Visit  Medication Sig Dispense Refill  . chlorthalidone (HYGROTON) 25 MG tablet Take 1 tablet (25 mg total) by mouth daily. (Patient taking differently: Take 25 mg by mouth as needed. )  90 tablet 1  . cholecalciferol (VITAMIN D) 1000 UNITS tablet Take 1,000 Units by mouth daily.    . clobetasol cream (TEMOVATE) 8.56 % Apply 1 application topically 2 (two) times daily. Apply BID for one week then once weekly (Patient taking differently: Apply 1 application topically 2 (two) times daily as needed (rash). ) 30 g 2  . levothyroxine (SYNTHROID, LEVOTHROID) 112 MCG tablet Take 1 tablet (112 mcg total) by mouth daily. 90 tablet 3  . umeclidinium bromide (INCRUSE ELLIPTA) 62.5 MCG/INH AEPB Inhale 1 puff into the lungs daily. (Patient taking differently: Inhale 1 puff into the lungs every other day. ) 90 each 3  . zolpidem (AMBIEN) 5 MG tablet Take 1 tablet (5 mg total) by mouth at bedtime as needed for sleep. 30 tablet 5  . calcium carbonate (OS-CAL -  DOSED IN MG OF ELEMENTAL CALCIUM) 1250 (500 CA) MG tablet Take 1 tablet by mouth daily with breakfast. Reported on 04/03/2016    . oxyCODONE-acetaminophen (ROXICET) 5-325 MG tablet Take 1-2 tablets by mouth every 4 (four) hours as needed. (Patient not taking: Reported on 04/03/2016) 30 tablet 0   No current facility-administered medications on file prior to visit.     Allergies  Allergen Reactions  . Estrogens     Blood clot in leg    Family History  Problem Relation Age of Onset  . Hypertension Mother   . Aneurysm Mother   . Cancer Mother     Uterine  . Cancer Other   . Arthritis Sister   . Hyperlipidemia Sister   . Hypertension Sister   . Stroke Sister   . Kidney disease Sister   . Hyperlipidemia Brother   . Thyroid cancer Daughter   . Breast cancer Maternal Grandmother   . Hypertension Maternal Grandmother   . Hypercalcemia Neg Hx     BP 106/68   Pulse 72   Temp 97.6 F (36.4 C) (Oral)   Ht 5' 5.25" (1.657 m)   Wt 202 lb (91.6 kg)   SpO2 97%   BMI 33.36 kg/m    Review of Systems She has hair loss, arthralgias, depression, and weight gain.  denies galactorrhea, hematuria, memory loss, numbness, abdominal pain, hypoglycemia, visual loss, sob, diarrhea, rhinorrhea, and easy bruising.      Objective:   Physical Exam VS: see vs page GEN: no distress HEAD: head: no deformity eyes: no periorbital swelling, no proptosis external nose and ears are normal mouth: no lesion seen NECK: supple, thyroid is not enlarged CHEST WALL: no deformity LUNGS: clear to auscultation CV: reg rate and rhythm, no murmur ABD: abdomen is soft, nontender.  no hepatosplenomegaly.  not distended.  no hernia MUSCULOSKELETAL: muscle bulk and strength are grossly normal.  no obvious joint swelling.  gait is normal and steady EXTEMITIES: no deformity.  no ulcer on the feet.  feet are of normal color and temp.  no edema PULSES: dorsalis pedis intact bilat.  no carotid bruit NEURO:  cn 2-12  grossly intact.   readily moves all 4's.  sensation is intact to touch on the feet SKIN:  Normal texture and temperature.  No rash or suspicious lesion is visible.   NODES:  None palpable at the neck PSYCH: alert, well-oriented.  Does not appear anxious nor depressed.   Lab Results  Component Value Date   PTH 86* 02/25/2016   CALCIUM 9.9 03/03/2016   PHOS 3.4 02/25/2016   Lab Results  Component Value Date   CREATININE 1.50* 03/03/2016  BUN 24* 03/03/2016   NA 140 03/03/2016   K 3.9 03/03/2016   CL 106 03/03/2016   CO2 26 03/03/2016   Lab Results  Component Value Date   TSH 1.08 01/15/2016   T3TOTAL <10.0* 02/27/2013    DEXA: Assessment: the BMD is low according to the [facility] classification for osteoporosis (see below). Fracture risk: moderate FRAX score: 10 year major osteoporotic risk: 4.7%. 10 year hip fracture risk: 0.8%. These are under the thresholds for treatment of 20% and 3%, respectively.  I have reviewed outside records, and summarized: Pt was noted to have elevated Ca++, and referred here.  BP was also noted to be high, and she had doe.      Assessment & Plan:  Hyperparathyroidism, mild: prob primary, but we should exclude vit-D deficiency Renal insufficiency: this could also cause hyperparathyroidism.  hypercalcemia, new, uncertain etiology.  Chlorthalidone could contribute.  However, since hypercalcemia is mild, she does not need to reduce this now.    Patient is advised the following: Patient Instructions  blood tests are requested for you today.  We'll let you know about the results. This is not a serious condition, so don't worry. Please come back for a follow-up appointment in 6 months.    Valerie Shin, MD

## 2016-04-03 NOTE — Patient Instructions (Addendum)
blood tests are requested for you today.  We'll let you know about the results. This is not a serious condition, so don't worry. Please come back for a follow-up appointment in 6 months.

## 2016-04-05 ENCOUNTER — Other Ambulatory Visit: Payer: Self-pay | Admitting: Internal Medicine

## 2016-04-05 DIAGNOSIS — E890 Postprocedural hypothyroidism: Secondary | ICD-10-CM

## 2016-04-06 LAB — PTH, INTACT AND CALCIUM
Calcium: 9.7 mg/dL (ref 8.4–10.5)
PTH: 63 pg/mL (ref 14–64)

## 2016-04-07 LAB — VITAMIN D 1,25 DIHYDROXY
Vitamin D 1, 25 (OH)2 Total: 83 pg/mL — ABNORMAL HIGH (ref 18–72)
Vitamin D2 1, 25 (OH)2: <8 pg/mL
Vitamin D3 1, 25 (OH)2: 83 pg/mL

## 2016-04-14 ENCOUNTER — Encounter: Payer: Self-pay | Admitting: Internal Medicine

## 2016-04-14 ENCOUNTER — Ambulatory Visit (INDEPENDENT_AMBULATORY_CARE_PROVIDER_SITE_OTHER): Payer: Medicare Other

## 2016-04-14 DIAGNOSIS — R0600 Dyspnea, unspecified: Secondary | ICD-10-CM

## 2016-04-14 DIAGNOSIS — R0609 Other forms of dyspnea: Secondary | ICD-10-CM

## 2016-04-14 LAB — EXERCISE TOLERANCE TEST
Estimated workload: 4 METS
Exercise duration (min): 1 min
Exercise duration (sec): 41 s
MPHR: 149 {beats}/min
Peak HR: 131 {beats}/min
Percent HR: 91 %
Percent of predicted max HR: 87 %
RPE: 18
Rest HR: 70 {beats}/min
Stage 1 DBP: 85 mmHg
Stage 1 Grade: 0 %
Stage 1 HR: 90 {beats}/min
Stage 1 SBP: 131 mmHg
Stage 1 Speed: 0 mph
Stage 2 Grade: 0 %
Stage 2 HR: 89 {beats}/min
Stage 2 Speed: 0 mph
Stage 3 Grade: 0 %
Stage 3 HR: 90 {beats}/min
Stage 3 Speed: 1 mph
Stage 4 Grade: 0.2 %
Stage 4 HR: 88 {beats}/min
Stage 4 Speed: 1 mph
Stage 5 Grade: 10 %
Stage 5 HR: 131 {beats}/min
Stage 5 Speed: 1.7 mph
Stage 6 DBP: 73 mmHg
Stage 6 Grade: 0 %
Stage 6 HR: 118 {beats}/min
Stage 6 SBP: 164 mmHg
Stage 6 Speed: 0 mph
Stage 7 DBP: 85 mmHg
Stage 7 Grade: 0 %
Stage 7 HR: 74 {beats}/min
Stage 7 SBP: 142 mmHg
Stage 7 Speed: 0 mph

## 2016-05-18 ENCOUNTER — Encounter (HOSPITAL_COMMUNITY): Payer: Self-pay

## 2016-05-19 ENCOUNTER — Ambulatory Visit (INDEPENDENT_AMBULATORY_CARE_PROVIDER_SITE_OTHER): Payer: Medicare Other | Admitting: Internal Medicine

## 2016-05-19 ENCOUNTER — Other Ambulatory Visit (INDEPENDENT_AMBULATORY_CARE_PROVIDER_SITE_OTHER): Payer: Medicare Other

## 2016-05-19 ENCOUNTER — Encounter: Payer: Self-pay | Admitting: Internal Medicine

## 2016-05-19 DIAGNOSIS — E785 Hyperlipidemia, unspecified: Secondary | ICD-10-CM | POA: Diagnosis not present

## 2016-05-19 DIAGNOSIS — E669 Obesity, unspecified: Secondary | ICD-10-CM | POA: Diagnosis not present

## 2016-05-19 DIAGNOSIS — E039 Hypothyroidism, unspecified: Secondary | ICD-10-CM

## 2016-05-19 DIAGNOSIS — I493 Ventricular premature depolarization: Secondary | ICD-10-CM

## 2016-05-19 DIAGNOSIS — I10 Essential (primary) hypertension: Secondary | ICD-10-CM

## 2016-05-19 LAB — LIPID PANEL
Cholesterol: 203 mg/dL — ABNORMAL HIGH (ref 0–200)
HDL: 44.5 mg/dL (ref >39.00)
LDL Cholesterol: 120 mg/dL — ABNORMAL HIGH (ref 0–99)
NonHDL: 158.97
Total CHOL/HDL Ratio: 5
Triglycerides: 196 mg/dL — ABNORMAL HIGH (ref 0.0–149.0)
VLDL: 39.2 mg/dL (ref 0.0–40.0)

## 2016-05-19 LAB — COMPREHENSIVE METABOLIC PANEL
ALT: 19 U/L (ref 0–35)
AST: 17 U/L (ref 0–37)
Albumin: 4.3 g/dL (ref 3.5–5.2)
Alkaline Phosphatase: 66 U/L (ref 39–117)
BUN: 15 mg/dL (ref 6–23)
CO2: 28 mEq/L (ref 19–32)
Calcium: 10 mg/dL (ref 8.4–10.5)
Chloride: 103 mEq/L (ref 96–112)
Creatinine, Ser: 1.5 mg/dL — ABNORMAL HIGH (ref 0.40–1.20)
GFR: 44 mL/min — ABNORMAL LOW (ref >60.00)
Glucose, Bld: 108 mg/dL — ABNORMAL HIGH (ref 70–99)
Potassium: 3.7 mEq/L (ref 3.5–5.1)
Sodium: 140 mEq/L (ref 135–145)
Total Bilirubin: 0.9 mg/dL (ref 0.2–1.2)
Total Protein: 7.8 g/dL (ref 6.0–8.3)

## 2016-05-19 LAB — TSH: TSH: 0.51 u[IU]/mL (ref 0.35–4.50)

## 2016-05-19 MED ORDER — CHLORTHALIDONE 25 MG PO TABS: 12.5000 mg | ORAL_TABLET | Freq: Every day | ORAL | 1 refills | Status: DC

## 2016-05-19 NOTE — Progress Notes (Signed)
Pre visit review using our clinic review tool, if applicable. No additional management support is needed unless otherwise documented below in the visit note. 

## 2016-05-19 NOTE — Progress Notes (Signed)
Subjective:  Patient ID: Valerie Jackson, female    DOB: 1946-05-01  Age: 70 y.o. MRN: [id number]  CC: Palpitations and Hypothyroidism   HPI Valerie Jackson presents for follow-up after recently undergoing an ETT for evaluation of chronic shortness of breath. The test was suboptimal because she had to stop it early due to shortness of breath. There were also PVCs noted during the test. She tells me that she has had palpitations for nearly 20 years. She notices them when she is resting or sleeping. She describes it as a sensation that there is flipping in her heart. The palpitations are not exacerbated by exercise and she denies chest pain, diaphoresis, near syncope or syncope, fatigue, weakness, or worsening shortness of breath/DOE. She tells me the palpitations do not bother her enough to have them treated or evaluated any further.  Outpatient Medications Prior to Visit  Medication Sig Dispense Refill  . calcium carbonate (OS-CAL - DOSED IN MG OF ELEMENTAL CALCIUM) 1250 (500 CA) MG tablet Take 1 tablet by mouth daily with breakfast. Reported on 04/03/2016    . cholecalciferol (VITAMIN D) 1000 UNITS tablet Take 1,000 Units by mouth daily.    . clobetasol cream (TEMOVATE) 1.44 % Apply 1 application topically 2 (two) times daily. Apply BID for one week then once weekly (Patient taking differently: Apply 1 application topically 2 (two) times daily as needed (rash). ) 30 g 2  . levothyroxine (SYNTHROID, LEVOTHROID) 112 MCG tablet TAKE 1 TABLET EVERY DAY 90 tablet 1  . oxyCODONE-acetaminophen (ROXICET) 5-325 MG tablet Take 1-2 tablets by mouth every 4 (four) hours as needed. 30 tablet 0  . umeclidinium bromide (INCRUSE ELLIPTA) 62.5 MCG/INH AEPB Inhale 1 puff into the lungs daily. (Patient taking differently: Inhale 1 puff into the lungs every other day. ) 90 each 3  . zolpidem (AMBIEN) 5 MG tablet Take 1 tablet (5 mg total) by mouth at bedtime as needed for sleep. 30 tablet 5  . chlorthalidone  (HYGROTON) 25 MG tablet Take 1 tablet (25 mg total) by mouth daily. (Patient taking differently: Take 25 mg by mouth as needed. ) 90 tablet 1   No facility-administered medications prior to visit.     ROS Review of Systems  Constitutional: Negative.  Negative for activity change, appetite change, chills, diaphoresis, fatigue, fever and unexpected weight change.  HENT: Negative.  Negative for sinus pressure, sore throat and trouble swallowing.   Eyes: Negative.  Negative for visual disturbance.  Respiratory: Positive for shortness of breath. Negative for apnea, cough, choking, chest tightness, wheezing and stridor.   Cardiovascular: Positive for palpitations. Negative for chest pain and leg swelling.  Gastrointestinal: Negative.  Negative for abdominal pain, blood in stool, constipation, diarrhea, nausea and vomiting.  Endocrine: Negative.   Genitourinary: Negative.   Musculoskeletal: Negative.  Negative for arthralgias, back pain, joint swelling, myalgias and neck pain.  Skin: Negative.  Negative for color change, pallor and rash.  Neurological: Negative.  Negative for dizziness, syncope, weakness and light-headedness.  Hematological: Negative.  Negative for adenopathy. Does not bruise/bleed easily.  Psychiatric/Behavioral: Negative.     Objective:  BP 132/80 (BP Location: Left Arm, Patient Position: Sitting, Cuff Size: Normal)   Pulse 90   Temp 98 F (36.7 C) (Oral)   Resp 16   Ht 5' 2.5" (1.588 m)   Wt 199 lb 4 oz (90.4 kg)   SpO2 96%   BMI 35.86 kg/m   BP Readings from Last 3 Encounters:  05/19/16 132/80  04/03/16 106/68  03/06/16 (!) 137/93    Wt Readings from Last 3 Encounters:  05/19/16 199 lb 4 oz (90.4 kg)  04/03/16 202 lb (91.6 kg)  03/04/16 212 lb 8.4 oz (96.4 kg)    Physical Exam  Constitutional: She is oriented to person, place, and time. No distress.  HENT:  Mouth/Throat: Oropharynx is clear and moist. No oropharyngeal exudate.  Eyes: Conjunctivae are  normal. Right eye exhibits no discharge. Left eye exhibits no discharge. No scleral icterus.  Neck: Normal range of motion. Neck supple. No JVD present. No tracheal deviation present. No thyromegaly present.  Cardiovascular: Normal rate, regular rhythm, normal heart sounds and intact distal pulses.  Exam reveals no gallop.   No murmur heard. Pulmonary/Chest: Effort normal and breath sounds normal. No stridor. No respiratory distress. She has no wheezes. She has no rales. She exhibits no tenderness.  Abdominal: Soft. Bowel sounds are normal. She exhibits no distension and no mass. There is no tenderness. There is no rebound and no guarding.  Musculoskeletal: Normal range of motion. She exhibits no edema, tenderness or deformity.  Lymphadenopathy:    She has no cervical adenopathy.  Neurological: She is oriented to person, place, and time.  Skin: Skin is warm and dry. No rash noted. She is not diaphoretic. No erythema. No pallor.  Vitals reviewed.   Lab Results  Component Value Date   WBC 8.3 03/03/2016   HGB 13.2 03/03/2016   HCT 39.4 03/03/2016   PLT 249 03/03/2016   GLUCOSE 108 (H) 05/19/2016   CHOL 203 (H) 05/19/2016   TRIG 196.0 (H) 05/19/2016   HDL 44.50 05/19/2016   LDLDIRECT 226.0 02/27/2013   LDLCALC 120 (H) 05/19/2016   ALT 19 05/19/2016   AST 17 05/19/2016   NA 140 05/19/2016   K 3.7 05/19/2016   CL 103 05/19/2016   CREATININE 1.50 (H) 05/19/2016   BUN 15 05/19/2016   CO2 28 05/19/2016   TSH 0.51 05/19/2016   INR 1.10 03/03/2016   HGBA1C 4.7 05/14/2014   August 2017 Negative stress test without evidence of ischemia at given workload. Frequent PVC's were noted with exercise. Significant dyspnea and moderately reduced exercise tolerance were noted. Clinical correlation is advised.  No results found.  Assessment & Plan:   Valerie Jackson was seen today for palpitations and hypothyroidism.  Diagnoses and all orders for this visit:  Hypercalcemia- her calcium level has  normalized, this is most likely related to the thiazide diuretic. -     Comprehensive metabolic panel; Future  Hyperlipidemia with target LDL less than 160- her Framingham risk score is only 6% so I do not recommend that she start taking a statin. -     Lipid panel; Future -     TSH; Future  Obesity (BMI 30-39.9)- she is working on her lifestyle modifications to lose weight  Hypothyroidism, unspecified hypothyroidism type- her TSH is in the normal range, she will remain on the current dose of levothyroxine. -     TSH; Future  Essential hypertension- her blood pressure is adequately well controlled. -     Comprehensive metabolic panel; Future -     chlorthalidone (HYGROTON) 25 MG tablet; Take 0.5 tablets (12.5 mg total) by mouth daily.  Symptomatic PVCs- she has minimal symptoms related to this and tells me that this has been an ongoing problem for 20 years, this is a benign situation and she does not want to treat it or have it evaluated any further. She will let me know  if she develops any new symptoms.   I have changed Valerie Jackson chlorthalidone. I am also having her maintain her zolpidem, clobetasol cream, cholecalciferol, calcium carbonate, umeclidinium bromide, oxyCODONE-acetaminophen, and levothyroxine.  Meds ordered this encounter  Medications  . chlorthalidone (HYGROTON) 25 MG tablet    Sig: Take 0.5 tablets (12.5 mg total) by mouth daily.    Dispense:  90 tablet    Refill:  1     Follow-up: Return in about 6 months (around 11/16/2016).  [name], MD

## 2016-05-19 NOTE — Patient Instructions (Signed)

## 2016-08-20 ENCOUNTER — Other Ambulatory Visit: Payer: Self-pay

## 2016-08-24 ENCOUNTER — Other Ambulatory Visit: Payer: Self-pay | Admitting: Internal Medicine

## 2016-08-24 DIAGNOSIS — E890 Postprocedural hypothyroidism: Secondary | ICD-10-CM

## 2016-08-26 ENCOUNTER — Ambulatory Visit (INDEPENDENT_AMBULATORY_CARE_PROVIDER_SITE_OTHER): Payer: Medicare Other | Admitting: Gynecology

## 2016-08-26 ENCOUNTER — Encounter: Payer: Self-pay | Admitting: Gynecology

## 2016-08-26 VITALS — BP 134/88 | Ht 65.0 in | Wt 194.5 lb

## 2016-08-26 DIAGNOSIS — Z01419 Encounter for gynecological examination (general) (routine) without abnormal findings: Secondary | ICD-10-CM | POA: Diagnosis not present

## 2016-08-26 DIAGNOSIS — M8580 Other specified disorders of bone density and structure, unspecified site: Secondary | ICD-10-CM

## 2016-08-26 NOTE — Progress Notes (Signed)
Valerie Jackson 05-May-1946 [id number]   History:    70 y.o.  for annual gyn exam with no complaints today. Patient was seen for the first time as a new patient last year.Her PCP is Dr. [name]. Patient has not had a gynecological exam in many years. She was previously being followed in [place]. She declined the flu vaccine today. She does state that many years ago while she had been on oral contraceptive pill she developed a DVT that many years later she was put on a low dose HRT because of severe vasomotor symptoms but discontinued it after one year. She is not sexually active. She denied any past history of any abnormal Pap smears. Patient with past history of benign colon polyps last colonoscopy in 2015. Patient with past history of colon resection and past history of colostomy as a result of ruptured diverticulum? Her last bone density study in 2016 demonstrated that the lowest T score was at the left femoral neck with a value of -1.8.  Past medical history,surgical history, family history and social history were all reviewed and documented in the EPIC chart.  Gynecologic History No LMP recorded. Patient is postmenopausal. Contraception: post menopausal status Last Pap: 2016. Results were: normal Last mammogram: 2016. Results were: normal  Obstetric History OB History  Gravida Para Term Preterm AB Living  1 1       2   SAB TAB Ectopic Multiple Live Births          1    # Outcome Date GA Lbr Len/2nd Weight Sex Delivery Anes PTL Lv  1 Para     F Vag-Spont   LIV       ROS: A ROS was performed and pertinent positives and negatives are included in the history.  GENERAL: No fevers or chills. HEENT: No change in vision, no earache, sore throat or sinus congestion. NECK: No pain or stiffness. CARDIOVASCULAR: No chest pain or pressure. No palpitations. PULMONARY: No shortness of breath, cough or wheeze. GASTROINTESTINAL: No abdominal pain, nausea, vomiting or diarrhea, melena or bright  red blood per rectum. GENITOURINARY: No urinary frequency, urgency, hesitancy or dysuria. MUSCULOSKELETAL: No joint or muscle pain, no back pain, no recent trauma. DERMATOLOGIC: No rash, no itching, no lesions. ENDOCRINE: No polyuria, polydipsia, no heat or cold intolerance. No recent change in weight. HEMATOLOGICAL: No anemia or easy bruising or bleeding. NEUROLOGIC: No headache, seizures, numbness, tingling or weakness. PSYCHIATRIC: No depression, no loss of interest in normal activity or change in sleep pattern.     Exam: chaperone present  BP 134/88   Ht 5' 5"  (1.651 m)   Wt 194 lb 8 oz (88.2 kg)   BMI 32.37 kg/m   Body mass index is 32.37 kg/m.  General appearance : Well developed well nourished female. No acute distress HEENT: Eyes: no retinal hemorrhage or exudates,  Neck supple, trachea midline, no carotid bruits, no thyroidmegaly Lungs: Clear to auscultation, no rhonchi or wheezes, or rib retractions  Heart: Regular rate and rhythm, no murmurs or gallops Breast:Examined in sitting and supine position were symmetrical in appearance, no palpable masses or tenderness,  no skin retraction, no nipple inversion, no nipple discharge, no skin discoloration, no axillary or supraclavicular lymphadenopathy Abdomen: no palpable masses or tenderness, no rebound or guarding Extremities: no edema or skin discoloration or tenderness  Pelvic:  Bartholin, Urethra, Skene Glands: Within normal limits             Vagina: No gross  lesions or discharge atrophic changes  Cervix: No gross lesions or discharge  Uterus  anteverted, normal size, shape and consistency, non-tender and mobile  Adnexa  Without masses or tenderness  Anus and perineum  normal   Rectovaginal  normal sphincter tone without palpated masses or tenderness             Hemoccult PCP provides     Assessment/Plan:  70 y.o. female for annual exam with history of osteopenia taking calcium and vitamin D. She will need a bone density  study next year.Patient currently on no hormone replacement therapy. Patient's PCP has been doing her blood work. Review of patient's records indicated all her vaccines are up-to-date. Pap smear no longer indicated according to the new guidelines. We discussed importance of calcium vitamin D and weightbearing exercises for osteoporosis prevention.   Terrance Mass MD, 11:26 AM 08/26/2016

## 2016-09-23 ENCOUNTER — Ambulatory Visit: Payer: Medicare Other | Admitting: Internal Medicine

## 2016-09-30 ENCOUNTER — Ambulatory Visit: Payer: Medicare Other | Admitting: Internal Medicine

## 2016-10-03 NOTE — Progress Notes (Signed)
Subjective:    Patient ID: Valerie Jackson, female    DOB: 07-15-1946, 71 y.o.   MRN: [id number]  HPI  The state of at least three ongoing medical problems is addressed today, with interval history of each noted here:  Pt returns for f/u of hypercalcemia (dx'ed early 2017 (it was normal in 2016); chlorthalidone was continued, as hypercalcemia is mild; no other cause was found).  No change in chronic doe.   Mild hyperparathyroidism (renal insufficiency could contribute; she did not meet criteria for surgery; she has never had urolithisis).  Denies hematuria.   Osteoporosis: (dx'ed 2016: she has never been on rx for this; FRAX in 2016: 10 year major osteoporotic risk: 4.7%. 10 year hip fracture risk: 0.8%. These are under the thresholds for treatment of 20% and 3%, respectively; she has never been on rx for this; she has never had bony fracture).  Denies falls.  HTN: denies edema.   Vit-D deficiency: she has intermittent leg cramps.   Past Medical History:  Diagnosis Date  . Arthritis    fingers  . Blood clot in vein 1968  . Cataracts, bilateral   . Chronic kidney disease    CKD stage 2  . COPD (chronic obstructive pulmonary disease) ([place])   . Depression   . Diverticulitis 09/2013   with perforation  . Diverticulitis of colon with perforation 09/28/2013  . Dizziness    with decreased BP readings or bending over  . Dysrhythmia   . Floaters   . GERD (gastroesophageal reflux disease)   . H/O hiatal hernia   . Headache(784.0)    migraine every 5- to 6 years  . Heart murmur since age 87   mild  . Hepatitis age 1 or 84   had heapatitis, not sure which kind  . History of blood transfusion 1965  . History of frequent urinary tract infections   . Hypercholesteremia   . Hypertension   . Hypokalemia   . Hypothyroid   . Obesity   . Pericolonic abscess 2015  . Pneumonia   . Shortness of breath dyspnea   . Sickle cell trait ([place])   . Wears glasses     Past Surgical History:    Procedure Laterality Date  . CHOLECYSTECTOMY  2002  . COLON RESECTION N/A 10/11/2013   Procedure: EXPLORATORY LAPAROTOMY WITH RECTOSIGMOID COLECTOMY AND END COLOSTOMY;  Surgeon: Stark Klein, MD;  Location: [facility];  Service: General;  Laterality: N/A;  . COLOSTOMY TAKEDOWN N/A 02/27/2014   Procedure: COLOSTOMY TAKEDOWN [name] HERNIA REPAIR;  Surgeon: Stark Klein, MD;  Location: [facility];  Service: General;  Laterality: N/A;  . INCISIONAL HERNIA REPAIR N/A 03/04/2016   Procedure: LAPAROSCOPIC INCISIONAL HERNIA WITH MESH;  Surgeon: Ralene Ok, MD;  Location: [facility];  Service: General;  Laterality: N/A;  . LAPAROSCOPIC LYSIS OF ADHESIONS N/A 03/04/2016   Procedure: LAPAROSCOPIC LYSIS OF ADHESIONS;  Surgeon: Ralene Ok, MD;  Location: [facility];  Service: General;  Laterality: N/A;  . TONSILLECTOMY  1952    Social History   Social History  . Marital status: Divorced    Spouse name: N/A  . Number of children: 1  . Years of education: 28   Occupational History  . Retired     Therapist, sports   Social History Main Topics  . Smoking status: Former Smoker    Packs/day: 0.50    Years: 50.00    Types: Cigarettes    Quit date: 09/06/2012  . Smokeless tobacco: Never Used  .  Alcohol use No  . Drug use: No  . Sexual activity: Not Currently   Other Topics Concern  . Not on file   Social History Narrative   Regular exercise-no   Caffeine Use-yes    Current Outpatient Prescriptions on File Prior to Visit  Medication Sig Dispense Refill  . calcium carbonate (OS-CAL - DOSED IN MG OF ELEMENTAL CALCIUM) 1250 (500 CA) MG tablet Take 1 tablet by mouth daily with breakfast. Reported on 04/03/2016    . chlorthalidone (HYGROTON) 25 MG tablet Take 0.5 tablets (12.5 mg total) by mouth daily. 90 tablet 1  . cholecalciferol (VITAMIN D) 1000 UNITS tablet Take 1,000 Units by mouth daily.    . clobetasol cream (TEMOVATE) 2.20 % Apply 1 application topically 2 (two) times daily. Apply BID for one week then  once weekly 30 g 2  . levothyroxine (SYNTHROID, LEVOTHROID) 112 MCG tablet TAKE 1 TABLET EVERY DAY 90 tablet 1  . zolpidem (AMBIEN) 5 MG tablet Take 1 tablet (5 mg total) by mouth at bedtime as needed for sleep. 30 tablet 5   No current facility-administered medications on file prior to visit.     Allergies  Allergen Reactions  . Estrogens     Blood clot in leg    Family History  Problem Relation Age of Onset  . Hypertension Mother   . Aneurysm Mother   . Cancer Mother     Uterine  . Cancer Other   . Arthritis Sister   . Hyperlipidemia Sister   . Hypertension Sister   . Stroke Sister   . Kidney disease Sister   . Hyperlipidemia Brother   . Thyroid cancer Daughter   . Breast cancer Maternal Grandmother   . Hypertension Maternal Grandmother   . Hypercalcemia Neg Hx     BP 126/82   Pulse 82   Ht 5' 5"  (1.651 m)   Wt 201 lb (91.2 kg)   SpO2 97%   BMI 33.45 kg/m   Review of Systems Denies LOC and arthralgias.      Objective:   Physical Exam VITAL SIGNS:  See vs page GENERAL: no distress NECK: There is no palpable thyroid enlargement.  No thyroid nodule is palpable.  No palpable lymphadenopathy at the anterior neck. Spine: no kyphosis Gait: normal and steady.    Lab Results  Component Value Date   PTH 51 10/05/2016   CALCIUM 10.4 10/05/2016   CALCIUM 10.5 10/05/2016   PHOS 3.4 02/25/2016  vit-D=42    Assessment & Plan:  Hyperparathyroidism, better.  Renal insufficiency: this could also cause hyperparathyroidism.  hypercalcemia, improved.  She can continue chlorthalidone.  Vit-D deficiency: better with supplementation.   No other medication is needed now.  We'll follow labs.  Please come back for a follow-up appointment in 6 months.

## 2016-10-05 ENCOUNTER — Encounter: Payer: Self-pay | Admitting: Endocrinology

## 2016-10-05 ENCOUNTER — Ambulatory Visit (INDEPENDENT_AMBULATORY_CARE_PROVIDER_SITE_OTHER): Payer: Medicare Other | Admitting: Endocrinology

## 2016-10-05 DIAGNOSIS — D573 Sickle-cell trait: Secondary | ICD-10-CM

## 2016-10-05 DIAGNOSIS — E785 Hyperlipidemia, unspecified: Secondary | ICD-10-CM

## 2016-10-05 DIAGNOSIS — Z0000 Encounter for general adult medical examination without abnormal findings: Secondary | ICD-10-CM | POA: Diagnosis not present

## 2016-10-05 DIAGNOSIS — Z13820 Encounter for screening for osteoporosis: Secondary | ICD-10-CM | POA: Insufficient documentation

## 2016-10-05 LAB — BASIC METABOLIC PANEL
BUN: 21 mg/dL (ref 6–23)
CO2: 29 mEq/L (ref 19–32)
Calcium: 10.5 mg/dL (ref 8.4–10.5)
Chloride: 103 mEq/L (ref 96–112)
Creatinine, Ser: 1.42 mg/dL — ABNORMAL HIGH (ref 0.40–1.20)
GFR: 46.82 mL/min — ABNORMAL LOW (ref >60.00)
Glucose, Bld: 100 mg/dL — ABNORMAL HIGH (ref 70–99)
Potassium: 4.1 mEq/L (ref 3.5–5.1)
Sodium: 138 mEq/L (ref 135–145)

## 2016-10-05 LAB — LIPID PANEL
Cholesterol: 229 mg/dL — ABNORMAL HIGH (ref 0–200)
HDL: 50.6 mg/dL (ref >39.00)
LDL Cholesterol: 145 mg/dL — ABNORMAL HIGH (ref 0–99)
NonHDL: 178.26
Total CHOL/HDL Ratio: 5
Triglycerides: 166 mg/dL — ABNORMAL HIGH (ref 0.0–149.0)
VLDL: 33.2 mg/dL (ref 0.0–40.0)

## 2016-10-05 LAB — CBC WITH DIFFERENTIAL/PLATELET
Basophils Absolute: 0 10*3/uL (ref 0.0–0.1)
Basophils Relative: 0.6 % (ref 0.0–3.0)
Eosinophils Absolute: 0.3 10*3/uL (ref 0.0–0.7)
Eosinophils Relative: 4 % (ref 0.0–5.0)
HCT: 36.7 % (ref 36.0–46.0)
Hemoglobin: 12.7 g/dL (ref 12.0–15.0)
Lymphocytes Relative: 31.7 % (ref 12.0–46.0)
Lymphs Abs: 2.5 10*3/uL (ref 0.7–4.0)
MCHC: 34.6 g/dL (ref 30.0–36.0)
MCV: 87.3 fl (ref 78.0–100.0)
Monocytes Absolute: 0.3 10*3/uL (ref 0.1–1.0)
Monocytes Relative: 4.1 % (ref 3.0–12.0)
Neutro Abs: 4.6 10*3/uL (ref 1.4–7.7)
Neutrophils Relative %: 59.6 % (ref 43.0–77.0)
Platelets: 230 10*3/uL (ref 150.0–400.0)
RBC: 4.2 Mil/uL (ref 3.87–5.11)
RDW: 13.9 % (ref 11.5–15.5)
WBC: 7.8 10*3/uL (ref 4.0–10.5)

## 2016-10-05 LAB — VITAMIN D 25 HYDROXY (VIT D DEFICIENCY, FRACTURES): VITD: 41.78 ng/mL (ref 30.00–100.00)

## 2016-10-05 NOTE — Patient Instructions (Addendum)
blood tests are requested for you today.  We'll let you know about the results. This is not a serious condition, so don't worry.   Please come back for a follow-up appointment in 6 months.

## 2016-10-06 LAB — PTH, INTACT AND CALCIUM
Calcium: 10.4 mg/dL (ref 8.6–10.4)
PTH: 51 pg/mL (ref 14–64)

## 2016-10-07 NOTE — Progress Notes (Deleted)
Pre visit review using our clinic review tool, if applicable. No additional management support is needed unless otherwise documented below in the visit note. 

## 2016-10-07 NOTE — Progress Notes (Deleted)
Subjective:   Valerie Jackson is a 71 y.o. female who presents for Medicare Annual (Subsequent) preventive examination.  The Patient was informed that the wellness visit is to identify future health risk and educate and initiate measures that can reduce risk for increased disease through the lifespan.   Describes health as fair, good or great?   Review of Systems:  No ROS.  Medicare Wellness Visit.    Sleep patterns:  Home Safety/Smoke Alarms:   Living environment; residence and Firearm Safety:  Seat Belt Safety/Bike Helmet: Wears seat belt.   Counseling:   Eye Exam-  Dental-  Female:   Pap-08/23/2015.       Mammo-08/06/2015, negative.  Dexa scan-07/03/2015, mild osteoporosis.      CCS-colonoscopy 01/31/2014, polyps. Recall 5 years.      Objective:     Vitals: There were no vitals taken for this visit.  There is no height or weight on file to calculate BMI.   Tobacco History  Smoking Status  . Former Smoker  . Packs/day: 0.50  . Years: 50.00  . Types: Cigarettes  . Quit date: 09/06/2012  Smokeless Tobacco  . Never Used     Counseling given: Not Answered   Past Medical History:  Diagnosis Date  . Arthritis    fingers  . Blood clot in vein 1968  . Cataracts, bilateral   . Chronic kidney disease    CKD stage 2  . COPD (chronic obstructive pulmonary disease) ([place])   . Depression   . Diverticulitis 09/2013   with perforation  . Diverticulitis of colon with perforation 09/28/2013  . Dizziness    with decreased BP readings or bending over  . Dysrhythmia   . Floaters   . GERD (gastroesophageal reflux disease)   . H/O hiatal hernia   . Headache(784.0)    migraine every 5- to 6 years  . Heart murmur since age 67   mild  . Hepatitis age 87 or 38   had heapatitis, not sure which kind  . History of blood transfusion 1965  . History of frequent urinary tract infections   . Hypercholesteremia   . Hypertension   . Hypokalemia   . Hypothyroid   . Obesity    . Pericolonic abscess 2015  . Pneumonia   . Shortness of breath dyspnea   . Sickle cell trait ([place])   . Wears glasses    Past Surgical History:  Procedure Laterality Date  . CHOLECYSTECTOMY  2002  . COLON RESECTION N/A 10/11/2013   Procedure: EXPLORATORY LAPAROTOMY WITH RECTOSIGMOID COLECTOMY AND END COLOSTOMY;  Surgeon: Stark Klein, MD;  [facility];  Service: General;  Laterality: N/A;  . COLOSTOMY TAKEDOWN N/A 02/27/2014   Procedure: COLOSTOMY TAKEDOWN [name] HERNIA REPAIR;  Surgeon: Stark Klein, MD;  [facility];  Service: General;  Laterality: N/A;  . INCISIONAL HERNIA REPAIR N/A 03/04/2016   Procedure: LAPAROSCOPIC INCISIONAL HERNIA WITH MESH;  Surgeon: Ralene Ok, MD;  [facility];  Service: General;  Laterality: N/A;  . LAPAROSCOPIC LYSIS OF ADHESIONS N/A 03/04/2016   Procedure: LAPAROSCOPIC LYSIS OF ADHESIONS;  Surgeon: Ralene Ok, MD;  [facility];  Service: General;  Laterality: N/A;  . TONSILLECTOMY  1952   Family History  Problem Relation Age of Onset  . Hypertension Mother   . Aneurysm Mother   . Cancer Mother     Uterine  . Cancer Other   . Arthritis Sister   . Hyperlipidemia Sister   . Hypertension Sister   .  Stroke Sister   . Kidney disease Sister   . Hyperlipidemia Brother   . Thyroid cancer Daughter   . Breast cancer Maternal Grandmother   . Hypertension Maternal Grandmother   . Hypercalcemia Neg Hx    History  Sexual Activity  . Sexual activity: Not Currently    Outpatient Encounter Prescriptions as of 10/08/2016  Medication Sig  . calcium carbonate (OS-CAL - DOSED IN MG OF ELEMENTAL CALCIUM) 1250 (500 CA) MG tablet Take 1 tablet by mouth daily with breakfast. Reported on 04/03/2016  . chlorthalidone (HYGROTON) 25 MG tablet Take 0.5 tablets (12.5 mg total) by mouth daily.  . cholecalciferol (VITAMIN D) 1000 UNITS tablet Take 1,000 Units by mouth daily.  . clobetasol cream (TEMOVATE) 7.00 % Apply 1 application  topically 2 (two) times daily. Apply BID for one week then once weekly  . levothyroxine (SYNTHROID, LEVOTHROID) 112 MCG tablet TAKE 1 TABLET EVERY DAY  . zolpidem (AMBIEN) 5 MG tablet Take 1 tablet (5 mg total) by mouth at bedtime as needed for sleep.   No facility-administered encounter medications on file as of 10/08/2016.     Activities of Daily Living In your present state of health, do you have any difficulty performing the following activities: 03/04/2016 03/03/2016  Hearing? N N  Vision? Y Y  Difficulty concentrating or making decisions? Y N  Walking or climbing stairs? Y Y  Dressing or bathing? N N  Doing errands, shopping? N N  Some recent data might be hidden    Patient Care Team: Janith Lima, MD as PCP - General (Internal Medicine)    Assessment:    Physical assessment deferred to PCP.  Exercise Activities and Dietary recommendations   Diet (meal preparation, eat out, water intake, caffeinated beverages, dairy products, fruits and vegetables):   Breakfast: Lunch:  Dinner:      Goals      Patient Stated   . patient (pt-stated)          More stress relief and balance; Getting out and meeting people and enjoying art;      Fall Risk Fall Risk  08/20/2016 07/17/2015 05/23/2015 07/19/2013 02/27/2013  Falls in the past year? No No No No No   Depression Screen PHQ 2/9 Scores 07/17/2015 05/23/2015 07/19/2013 02/27/2013  PHQ - 2 Score 2 2 0 0  PHQ- 9 Score 4 - - -     Cognitive Function        Immunization History  Administered Date(s) Administered  . Influenza, High Dose Seasonal PF 07/19/2013  . Pneumococcal Conjugate-13 07/19/2013  . Pneumococcal Polysaccharide-23 09/25/2015  . Td 09/15/2011   Screening Tests Health Maintenance  Topic Date Due  . ZOSTAVAX  02/26/2005  . INFLUENZA VACCINE  12/12/2016 (Originally 04/14/2016)  . MAMMOGRAM  08/19/2017  . TETANUS/TDAP  09/14/2021  . COLONOSCOPY  02/09/2024  . DEXA SCAN  Completed  . Hepatitis C Screening   Completed  . PNA vac Low Risk Adult  Completed      Plan:     Continue to eat heart healthy diet (full of fruits, vegetables, whole grains, lean protein, water--limit salt, fat, and sugar intake) and increase physical activity as tolerated.  Continue doing brain stimulating activities (puzzles, reading, adult coloring books, staying active) to keep memory sharp.   During the course of the visit the patient was educated and counseled about the following appropriate screening and preventive services:   Vaccines to include Pneumoccal, Influenza, Hepatitis B, Td, Zostavax, HCV  Cardiovascular Disease  Colorectal cancer screening  Bone density screening  Diabetes screening  Glaucoma screening  Mammography/PAP  Nutrition counseling   Patient Instructions (the written plan) was given to the patient.   Gerilyn Nestle, RN  10/07/2016

## 2016-10-08 ENCOUNTER — Ambulatory Visit: Payer: Medicare Other

## 2016-10-13 ENCOUNTER — Encounter: Payer: Self-pay | Admitting: Internal Medicine

## 2016-10-13 ENCOUNTER — Other Ambulatory Visit (INDEPENDENT_AMBULATORY_CARE_PROVIDER_SITE_OTHER): Payer: Medicare Other

## 2016-10-13 ENCOUNTER — Ambulatory Visit (INDEPENDENT_AMBULATORY_CARE_PROVIDER_SITE_OTHER): Payer: Medicare Other | Admitting: Internal Medicine

## 2016-10-13 VITALS — BP 118/82 | HR 70 | Temp 97.6°F | Resp 16 | Wt 201.0 lb

## 2016-10-13 DIAGNOSIS — R42 Dizziness and giddiness: Secondary | ICD-10-CM | POA: Diagnosis not present

## 2016-10-13 DIAGNOSIS — E039 Hypothyroidism, unspecified: Secondary | ICD-10-CM | POA: Diagnosis not present

## 2016-10-13 DIAGNOSIS — I10 Essential (primary) hypertension: Secondary | ICD-10-CM

## 2016-10-13 DIAGNOSIS — I493 Ventricular premature depolarization: Secondary | ICD-10-CM

## 2016-10-13 DIAGNOSIS — N182 Chronic kidney disease, stage 2 (mild): Secondary | ICD-10-CM | POA: Diagnosis not present

## 2016-10-13 DIAGNOSIS — R55 Syncope and collapse: Secondary | ICD-10-CM

## 2016-10-13 DIAGNOSIS — R002 Palpitations: Secondary | ICD-10-CM

## 2016-10-13 LAB — TSH: TSH: 0.65 u[IU]/mL (ref 0.35–4.50)

## 2016-10-13 NOTE — Progress Notes (Signed)
Pre visit review using our clinic review tool, if applicable. No additional management support is needed unless otherwise documented below in the visit note. 

## 2016-10-13 NOTE — Progress Notes (Signed)
Subjective:  Patient ID: [name], female    DOB: 12/18/1945  Age: 71 y.o. MRN: [id number]  CC: Palpitations and Dizziness   HPI [place] presents for concerns about near syncopal episodes over the last month. Her last episode was about a week prior to this visit. Her symptoms only occur when she is standing in a warm shower - she feels dizzy and nearly passes out. During these episodes she feels off balance and short of breath but she does not develop chest pain, hemoptysis or edema. She has chronic, unchanged palpitations that she describes as skipped heartbeats. She denies chest pain or hemoptysis.  Outpatient Medications Prior to Visit  Medication Sig Dispense Refill  . calcium carbonate (OS-CAL - DOSED IN MG OF ELEMENTAL CALCIUM) 1250 (500 CA) MG tablet Take 1 tablet by mouth daily with breakfast. Reported on 04/03/2016    . cholecalciferol (VITAMIN D) 1000 UNITS tablet Take 1,000 Units by mouth daily.    [name] levothyroxine (SYNTHROID, LEVOTHROID) 112 MCG tablet TAKE 1 TABLET EVERY DAY 90 tablet 1  . zolpidem (AMBIEN) 5 MG tablet Take 1 tablet (5 mg total) by mouth at bedtime as needed for sleep. 30 tablet 5  . chlorthalidone (HYGROTON) 25 MG tablet Take 0.5 tablets (12.5 mg total) by mouth daily. 90 tablet 1  . clobetasol cream (TEMOVATE) 9.50 % Apply 1 application topically 2 (two) times daily. Apply BID for one week then once weekly (Patient not taking: Reported on 10/13/2016) 30 g 2   No facility-administered medications prior to visit.     ROS Review of Systems  Constitutional: Positive for unexpected weight change (wt gain). Negative for appetite change, chills, diaphoresis and fatigue.  HENT: Negative.   Eyes: Negative for visual disturbance.  Respiratory: Positive for shortness of breath. Negative for cough, chest tightness, wheezing and stridor.   Cardiovascular: Positive for palpitations. Negative for chest pain and leg swelling.  Gastrointestinal: Negative for  abdominal pain, constipation, diarrhea, nausea and vomiting.  Endocrine: Negative.   Genitourinary: Negative.  Negative for decreased urine volume, difficulty urinating, dysuria, hematuria and urgency.  Musculoskeletal: Negative.   Skin: Negative.   Neurological: Positive for dizziness, syncope and light-headedness. Negative for weakness, numbness and headaches.  Hematological: Negative for adenopathy. Does not bruise/bleed easily.  Psychiatric/Behavioral: Negative.     Objective:  BP 118/82   Pulse 70   Temp 97.6 F (36.4 C)   Resp 16   Wt 201 lb (91.2 kg)   SpO2 96%   BMI 33.45 kg/m   BP Readings from Last 3 Encounters:  10/15/16 126/78  10/13/16 118/82  10/05/16 126/82    Wt Readings from Last 3 Encounters:  10/15/16 201 lb (91.2 kg)  10/13/16 201 lb (91.2 kg)  10/05/16 201 lb (91.2 kg)    Physical Exam  Constitutional: She is oriented to person, place, and time. No distress.  HENT:  Mouth/Throat: Oropharynx is clear and moist. No oropharyngeal exudate.  Eyes: Conjunctivae are normal. Right eye exhibits no discharge. Left eye exhibits no discharge. No scleral icterus.  Neck: Normal range of motion. Neck supple. No JVD present. No tracheal deviation present. No thyromegaly present.  Cardiovascular: Normal rate, regular rhythm, normal heart sounds and intact distal pulses.   Occasional extrasystoles are present. Exam reveals no gallop and no friction rub.   No murmur heard. EKG ---  Sinus  Rhythm  - frequent ectopic ventricular beat s  # VECs = 3 BORDERLINE RHYTHM  Pulmonary/Chest: Effort normal and  breath sounds normal. No stridor. No respiratory distress. She has no wheezes. She has no rales. She exhibits no tenderness.  Abdominal: Soft. Bowel sounds are normal. She exhibits no distension and no mass. There is no tenderness. There is no rebound and no guarding.  Musculoskeletal: Normal range of motion. She exhibits no edema, tenderness or deformity.    Lymphadenopathy:    She has no cervical adenopathy.  Neurological: She is oriented to person, place, and time.  Skin: Skin is warm and dry. No rash noted. She is not diaphoretic. No erythema. No pallor.  Vitals reviewed.   Lab Results  Component Value Date   WBC 7.8 10/05/2016   HGB 12.7 10/05/2016   HCT 36.7 10/05/2016   PLT 230.0 10/05/2016   GLUCOSE 100 (H) 10/05/2016   CHOL 229 (H) 10/05/2016   TRIG 166.0 (H) 10/05/2016   HDL 50.60 10/05/2016   LDLDIRECT 226.0 02/27/2013   LDLCALC 145 (H) 10/05/2016   ALT 19 05/19/2016   AST 17 05/19/2016   NA 138 10/05/2016   K 4.1 10/05/2016   CL 103 10/05/2016   CREATININE 1.42 (H) 10/05/2016   BUN 21 10/05/2016   CO2 29 10/05/2016   TSH 0.65 10/13/2016   INR 1.10 03/03/2016   HGBA1C 4.7 05/14/2014    No results found.  Assessment & Plan:   [name] was seen today for palpitations and dizziness.  Diagnoses and all orders for this visit:  Symptomatic PVCs- I've asked her to undergo 48 hour Holter monitor to see if the PVCs are contributing to some of her current symptoms. -     EKG 12-Lead -     Holter monitor - 48 hour; Future  Dizziness- she is having vasovagal episodes as well as slightly low blood pressure so we will stop her diuretic.  Acquired hypothyroidism- her TSH is in the normal range, she will remain on current dose of levothyroxine. -     TSH; Future  CKD (chronic kidney disease) stage 2, GFR 60-89 ml/min- her renal function is stable, she will avoid nephrotoxic agents.  Palpitations -     Holter monitor - 48 hour; Future  Essential hypertension- her systolic blood pressure is down to 126 and she is symptomatic so I have asked her to hold the thiazide diuretic for now.  Vasovagal near syncope- her symptoms are consistent with vasovagal phenomenon but there are also other cofactors as well including palpitations and mild hypotension. Will address the other cofactors and if the symptoms continue then will  recommend further evaluation.   I have discontinued Ms. Gest clobetasol cream and chlorthalidone. I am also having her maintain her zolpidem, cholecalciferol, calcium carbonate, and levothyroxine.  No orders of the defined types were placed in this encounter.    Follow-up: Return in about 4 weeks (around 11/10/2016).  [name], MD

## 2016-10-13 NOTE — Patient Instructions (Signed)
Vasovagal Syncope, Adult Syncope, which is commonly known as fainting or passing out, is a temporary loss of consciousness. It occurs when the blood flow to the brain is reduced. Vasovagal syncope, also called neurocardiogenic syncope, is a fainting spell that happens when blood flow to the brain is reduced because of a sudden drop in heart rate and blood pressure. Vasovagal syncope is usually harmless. However, you can get injured if you fall during a fainting spell. What are the causes? This condition is caused by a drop in heart rate and blood pressure, usually in response to a trigger. Many things and situations can trigger an episode, including:  Pain.  Fear.  The sight of blood. This may occur during medical procedures, such as when blood is being drawn from a vein.  Common activities, such as coughing, swallowing, stretching, or going to the bathroom.  Emotional stress.  Being in a confined space.  Prolonged standing, especially in a warm environment.  Lack of sleep or rest.  Not eating for a long time.  Not drinking enough liquids.  Recent illness.  Drinking alcohol.  Taking drugs that affect blood pressure, such as marijuana, cocaine, opiates, or inhalants. What are the signs or symptoms? Before a fainting episode, you may:  Feel dizzy or light-headed.  Become pale.  Sense that you are going to faint.  Feel like the room is spinning.  Only see directly ahead (tunnel vision).  Feel sick to your stomach (nauseous).  See spots.  Slowly lose vision.  Hear ringing in your ears.  Have a headache.  Feel warm and sweaty.  Feel a sensation of pins and needles. During the fainting spell, you may twitch or make jerky movements. Fainting spells usually last no longer than a few minutes before you wake up. If you get up too quickly before your body can recover, you may faint again. How is this diagnosed? This condition is diagnosed based on your symptoms, your  medical history, and a physical exam. Tests may be done to rule out other causes of fainting. Tests may include:  Blood tests.  Heart tests, such as an electrocardiogram (ECG), echocardiogram, or electrophysiology study.  A test to check your response to changes in position (tilt table test). How is this treated? Usually, treatment is not needed for this condition. Your health care provider may suggest ways to help prevent fainting episodes. These may include:  Drinking additional fluids if you are exposed to a trigger.  Sitting or lying down if you notice signs that an episode is coming. If your fainting spells continue, your health care provider may recommend that you:  Take medicines to prevent fainting or to help reduce further episodes of fainting.  Do certain exercises.  Wear compression stockings.  Have surgery to place a pacemaker in your body (rare). Follow these instructions at home:  Learn to identify the signs that an episode is coming.  Sit or lie down at the first sign of a fainting spell. If you sit down, put your head down between your legs. If you lie down, swing your legs up in the air to increase blood flow to the brain.  Avoid hot tubs and saunas.  Avoid standing for a long time. If you have to stand for a long time, try:  Crossing your legs.  Flexing and stretching your leg muscles.  Squatting.  Moving your legs.  Bending over.  Drink enough fluid to keep your urine clear or pale yellow.  Make changes to  your diet that your health care provider recommends. You may be told to:  Avoid caffeine.  Eat more salt.  Take over-the-counter and prescription medicines only as told by your health care provider. Contact a health care provider if:  You continue to have fainting spells despite treatment.  You faint more often despite treatment.  You lose consciousness for more than a few minutes.  You faint during or after exercising or after being  startled.  You have twitching or jerky movements for longer than a few seconds during a fainting spell.  You have an episode of twitching or jerky movements without fainting. Get help right away if:  A fainting spell leads to an injury or bleeding.  You have new symptoms that occur with the fainting spells, such as:  Shortness of breath.  Chest pain.  Irregular heartbeat.  You twitch or make jerky movements for more than 5 minutes.  You twitch or make jerky movements during more than one fainting spell. This information is not intended to replace advice given to you by your health care provider. Make sure you discuss any questions you have with your health care provider. Document Released: 08/17/2012 Document Revised: 02/12/2016 Document Reviewed: 06/29/2015 Elsevier Interactive Patient Education  2017 [employer].

## 2016-10-14 NOTE — Progress Notes (Signed)
Pre visit review using our clinic review tool, if applicable. No additional management support is needed unless otherwise documented below in the visit note. 

## 2016-10-15 ENCOUNTER — Ambulatory Visit (INDEPENDENT_AMBULATORY_CARE_PROVIDER_SITE_OTHER): Payer: Medicare Other | Admitting: *Deleted

## 2016-10-15 VITALS — BP 126/78 | HR 94 | Ht 65.0 in | Wt 201.0 lb

## 2016-10-15 DIAGNOSIS — Z0000 Encounter for general adult medical examination without abnormal findings: Secondary | ICD-10-CM | POA: Diagnosis not present

## 2016-10-15 NOTE — Patient Instructions (Addendum)
Continue to eat heart healthy diet (full of fruits, vegetables, whole grains, lean protein, water--limit salt, fat, and sugar intake) and increase physical activity as tolerated.  Continue doing brain stimulating activities (puzzles, reading, adult coloring books, staying active) to keep memory sharp.   [facility] [phone]   [email or web address]   Fall Prevention in the Home Introduction Falls can cause injuries. They can happen to people of all ages. There are many things you can do to make your home safe and to help prevent falls. What can I do on the outside of my home?  Regularly fix the edges of walkways and driveways and fix any cracks.  Remove anything that might make you trip as you walk through a door, such as a raised step or threshold.  Trim any bushes or trees on the path to your home.  Use bright outdoor lighting.  Clear any walking paths of anything that might make someone trip, such as rocks or tools.  Regularly check to see if handrails are loose or broken. Make sure that both sides of any steps have handrails.  Any raised decks and porches should have guardrails on the edges.  Have any leaves, snow, or ice cleared regularly.  Use sand or salt on walking paths during winter.  Clean up any spills in your garage right away. This includes oil or grease spills. What can I do in the bathroom?  Use night lights.  Install grab bars by the toilet and in the tub and shower. Do not use towel bars as grab bars.  Use non-skid mats or decals in the tub or shower.  If you need to sit down in the shower, use a plastic, non-slip stool.  Keep the floor dry. Clean up any water that spills on the floor as soon as it happens.  Remove soap buildup in the tub or shower regularly.  Attach bath mats securely with double-sided non-slip rug tape.  Do not have throw rugs and other things on the floor that can make you trip. What can I do in the bedroom?  Use night  lights.  Make sure that you have a light by your bed that is easy to reach.  Do not use any sheets or blankets that are too big for your bed. They should not hang down onto the floor.  Have a firm chair that has side arms. You can use this for support while you get dressed.  Do not have throw rugs and other things on the floor that can make you trip. What can I do in the kitchen?  Clean up any spills right away.  Avoid walking on wet floors.  Keep items that you use a lot in easy-to-reach places.  If you need to reach something above you, use a strong step stool that has a grab bar.  Keep electrical cords out of the way.  Do not use floor polish or wax that makes floors slippery. If you must use wax, use non-skid floor wax.  Do not have throw rugs and other things on the floor that can make you trip. What can I do with my stairs?  Do not leave any items on the stairs.  Make sure that there are handrails on both sides of the stairs and use them. Fix handrails that are broken or loose. Make sure that handrails are as long as the stairways.  Check any carpeting to make sure that it is firmly attached to the stairs. Fix any carpet that is  loose or worn.  Avoid having throw rugs at the top or bottom of the stairs. If you do have throw rugs, attach them to the floor with carpet tape.  Make sure that you have a light switch at the top of the stairs and the bottom of the stairs. If you do not have them, ask someone to add them for you. What else can I do to help prevent falls?  Wear shoes that:  Do not have high heels.  Have rubber bottoms.  Are comfortable and fit you well.  Are closed at the toe. Do not wear sandals.  If you use a stepladder:  Make sure that it is fully opened. Do not climb a closed stepladder.  Make sure that both sides of the stepladder are locked into place.  Ask someone to hold it for you, if possible.  Clearly mark and make sure that you can  see:  Any grab bars or handrails.  First and last steps.  Where the edge of each step is.  Use tools that help you move around (mobility aids) if they are needed. These include:  Canes.  Walkers.  Scooters.  Crutches.  Turn on the lights when you go into a dark area. Replace any light bulbs as soon as they burn out.  Set up your furniture so you have a clear path. Avoid moving your furniture around.  If any of your floors are uneven, fix them.  If there are any pets around you, be aware of where they are.  Review your medicines with your doctor. Some medicines can make you feel dizzy. This can increase your chance of falling. Ask your doctor what other things that you can do to help prevent falls. This information is not intended to replace advice given to you by your health care provider. Make sure you discuss any questions you have with your health care provider. Document Released: 06/27/2009 Document Revised: 02/06/2016 Document Reviewed: 10/05/2014  2017 Elsevier  Health Maintenance, Female Introduction Adopting a healthy lifestyle and getting preventive care can go a long way to promote health and wellness. Talk with your health care provider about what schedule of regular examinations is right for you. This is a good chance for you to check in with your provider about disease prevention and staying healthy. In between checkups, there are plenty of things you can do on your own. Experts have done a lot of research about which lifestyle changes and preventive measures are most likely to keep you healthy. Ask your health care provider for more information. Weight and diet Eat a healthy diet  Be sure to include plenty of vegetables, fruits, low-fat dairy products, and lean protein.  Do not eat a lot of foods high in solid fats, added sugars, or salt.  Get regular exercise. This is one of the most important things you can do for your health.  Most adults should exercise  for at least 150 minutes each week. The exercise should increase your heart rate and make you sweat (moderate-intensity exercise).  Most adults should also do strengthening exercises at least twice a week. This is in addition to the moderate-intensity exercise. Maintain a healthy weight  Body mass index (BMI) is a measurement that can be used to identify possible weight problems. It estimates body fat based on height and weight. Your health care provider can help determine your BMI and help you achieve or maintain a healthy weight.  For females 73 years of age and older:  A BMI below 18.5 is considered underweight.  A BMI of 18.5 to 24.9 is normal.  A BMI of 25 to 29.9 is considered overweight.  A BMI of 30 and above is considered obese. Watch levels of cholesterol and blood lipids  You should start having your blood tested for lipids and cholesterol at 72 years of age, then have this test every 5 years.  You may need to have your cholesterol levels checked more often if:  Your lipid or cholesterol levels are high.  You are older than 72 years of age.  You are at high risk for heart disease. Cancer screening Lung Cancer  Lung cancer screening is recommended for adults 29-90 years old who are at high risk for lung cancer because of a history of smoking.  A yearly low-dose CT scan of the lungs is recommended for people who:  Currently smoke.  Have quit within the past 15 years.  Have at least a 30-pack-year history of smoking. A pack year is smoking an average of one pack of cigarettes a day for 1 year.  Yearly screening should continue until it has been 15 years since you quit.  Yearly screening should stop if you develop a health problem that would prevent you from having lung cancer treatment. Breast Cancer  Practice breast self-awareness. This means understanding how your breasts normally appear and feel.  It also means doing regular breast self-exams. Let your health  care provider know about any changes, no matter how small.  If you are in your 20s or 30s, you should have a clinical breast exam (CBE) by a health care provider every 1-3 years as part of a regular health exam.  If you are 45 or older, have a CBE every year. Also consider having a breast X-ray (mammogram) every year.  If you have a family history of breast cancer, talk to your health care provider about genetic screening.  If you are at high risk for breast cancer, talk to your health care provider about having an MRI and a mammogram every year.  Breast cancer gene (BRCA) assessment is recommended for women who have family members with BRCA-related cancers. BRCA-related cancers include:  Breast.  Ovarian.  Tubal.  Peritoneal cancers.  Results of the assessment will determine the need for genetic counseling and BRCA1 and BRCA2 testing. Cervical Cancer  Your health care provider may recommend that you be screened regularly for cancer of the pelvic organs (ovaries, uterus, and vagina). This screening involves a pelvic examination, including checking for microscopic changes to the surface of your cervix (Pap test). You may be encouraged to have this screening done every 3 years, beginning at age 34.  For women ages 47-65, health care providers may recommend pelvic exams and Pap testing every 3 years, or they may recommend the Pap and pelvic exam, combined with testing for human papilloma virus (HPV), every 5 years. Some types of HPV increase your risk of cervical cancer. Testing for HPV may also be done on women of any age with unclear Pap test results.  Other health care providers may not recommend any screening for nonpregnant women who are considered low risk for pelvic cancer and who do not have symptoms. Ask your health care provider if a screening pelvic exam is right for you.  If you have had past treatment for cervical cancer or a condition that could lead to cancer, you need Pap  tests and screening for cancer for at least 20 years after your  treatment. If Pap tests have been discontinued, your risk factors (such as having a new sexual partner) need to be reassessed to determine if screening should resume. Some women have medical problems that increase the chance of getting cervical cancer. In these cases, your health care provider may recommend more frequent screening and Pap tests. Colorectal Cancer  This type of cancer can be detected and often prevented.  Routine colorectal cancer screening usually begins at 72 years of age and continues through 72 years of age.  Your health care provider may recommend screening at an earlier age if you have risk factors for colon cancer.  Your health care provider may also recommend using home test kits to check for hidden blood in the stool.  A small camera at the end of a tube can be used to examine your colon directly (sigmoidoscopy or colonoscopy). This is done to check for the earliest forms of colorectal cancer.  Routine screening usually begins at age 27.  Direct examination of the colon should be repeated every 5-10 years through 72 years of age. However, you may need to be screened more often if early forms of precancerous polyps or small growths are found. Skin Cancer  Check your skin from head to toe regularly.  Tell your health care provider about any new moles or changes in moles, especially if there is a change in a mole's shape or color.  Also tell your health care provider if you have a mole that is larger than the size of a pencil eraser.  Always use sunscreen. Apply sunscreen liberally and repeatedly throughout the day.  Protect yourself by wearing long sleeves, pants, a wide-brimmed hat, and sunglasses whenever you are outside. Heart disease, diabetes, and high blood pressure  High blood pressure causes heart disease and increases the risk of stroke. High blood pressure is more likely to develop  in:  People who have blood pressure in the high end of the normal range (130-139/85-89 mm Hg).  People who are overweight or obese.  People who are African American.  If you are 71-60 years of age, have your blood pressure checked every 3-5 years. If you are 59 years of age or older, have your blood pressure checked every year. You should have your blood pressure measured twice-once when you are at a hospital or clinic, and once when you are not at a hospital or clinic. Record the average of the two measurements. To check your blood pressure when you are not at a hospital or clinic, you can use:  An automated blood pressure machine at a pharmacy.  A home blood pressure monitor.  If you are between 7 years and 36 years old, ask your health care provider if you should take aspirin to prevent strokes.  Have regular diabetes screenings. This involves taking a blood sample to check your fasting blood sugar level.  If you are at a normal weight and have a low risk for diabetes, have this test once every three years after 72 years of age.  If you are overweight and have a high risk for diabetes, consider being tested at a younger age or more often. Preventing infection Hepatitis B  If you have a higher risk for hepatitis B, you should be screened for this virus. You are considered at high risk for hepatitis B if:  You were born in a country where hepatitis B is common. Ask your health care provider which countries are considered high risk.  Your parents were  born in a high-risk country, and you have not been immunized against hepatitis B (hepatitis B vaccine).  You have HIV or AIDS.  You use needles to inject street drugs.  You live with someone who has hepatitis B.  You have had sex with someone who has hepatitis B.  You get hemodialysis treatment.  You take certain medicines for conditions, including cancer, organ transplantation, and autoimmune conditions. Hepatitis C  Blood  testing is recommended for:  Everyone born from [birth year] through 1966.  Anyone with known risk factors for hepatitis C. Sexually transmitted infections (STIs)  You should be screened for sexually transmitted infections (STIs) including gonorrhea and chlamydia if:  You are sexually active and are younger than 72 years of age.  You are older than 72 years of age and your health care provider tells you that you are at risk for this type of infection.  Your sexual activity has changed since you were last screened and you are at an increased risk for chlamydia or gonorrhea. Ask your health care provider if you are at risk.  If you do not have HIV, but are at risk, it may be recommended that you take a prescription medicine daily to prevent HIV infection. This is called pre-exposure prophylaxis (PrEP). You are considered at risk if:  You are sexually active and do not regularly use condoms or know the HIV status of your partner(s).  You take drugs by injection.  You are sexually active with a partner who has HIV. Talk with your health care provider about whether you are at high risk of being infected with HIV. If you choose to begin PrEP, you should first be tested for HIV. You should then be tested every 3 months for as long as you are taking PrEP. Pregnancy  If you are premenopausal and you may become pregnant, ask your health care provider about preconception counseling.  If you may become pregnant, take 400 to 800 micrograms (mcg) of folic acid every day.  If you want to prevent pregnancy, talk to your health care provider about birth control (contraception). Osteoporosis and menopause  Osteoporosis is a disease in which the bones lose minerals and strength with aging. This can result in serious bone fractures. Your risk for osteoporosis can be identified using a bone density scan.  If you are 36 years of age or older, or if you are at risk for osteoporosis and fractures, ask your health  care provider if you should be screened.  Ask your health care provider whether you should take a calcium or vitamin D supplement to lower your risk for osteoporosis.  Menopause may have certain physical symptoms and risks.  Hormone replacement therapy may reduce some of these symptoms and risks. Talk to your health care provider about whether hormone replacement therapy is right for you. Follow these instructions at home:  Schedule regular health, dental, and eye exams.  Stay current with your immunizations.  Do not use any tobacco products including cigarettes, chewing tobacco, or electronic cigarettes.  If you are pregnant, do not drink alcohol.  If you are breastfeeding, limit how much and how often you drink alcohol.  Limit alcohol intake to no more than 1 drink per day for nonpregnant women. One drink equals 12 ounces of beer, 5 ounces of Sachin Ferencz, or 1 ounces of hard liquor.  Do not use street drugs.  Do not share needles.  Ask your health care provider for help if you need support or information about quitting  drugs.  Tell your health care provider if you often feel depressed.  Tell your health care provider if you have ever been abused or do not feel safe at home. This information is not intended to replace advice given to you by your health care provider. Make sure you discuss any questions you have with your health care provider. Document Released: 03/16/2011 Document Revised: 02/06/2016 Document Reviewed: 06/04/2015  2017 [employer]

## 2016-10-15 NOTE — Progress Notes (Addendum)
Subjective:   Valerie Jackson is a 71 y.o. female who presents for Medicare Annual (Subsequent) preventive examination.  Review of Systems:  No ROS.  Medicare Wellness Visit.    Sleep patterns: has frequent nighttime awakenings to urinate . Feels rested on waking. 4-6 hours per night , naps as needed. Home Safety/Smoke Alarms:  Feels safe in home. Smoke alarms in place.   Living environment; residence and Firearm Safety: Valerie Jackson, firearms stored safely. Lives with daughter and Valerie Jackson. Seat Belt Safety/Bike Helmet: Wears seat belt.   Counseling:   Eye Exam- Last: 2 years  Dental- Last over 2 years, recently acquired dental insurance, pt. states she will make appointment soon.  Female:   Pap- Last 08/23/15. Followed by GYN      Mammo- Last 08/06/2015      Dexa scan- Last 07/07/2015,osteoporosis        CCS- Last 01/31/14, 2 polyps, recall in 5 years      Objective:     Vitals: There were no vitals taken for this visit.  There is no height or weight on file to calculate BMI.   Tobacco History  Smoking Status  . Former Smoker  . Packs/day: 0.50  . Years: 50.00  . Types: Cigarettes  . Quit date: 09/06/2012  Smokeless Tobacco  . Never Used     Counseling given: Not Answered   Past Medical History:  Diagnosis Date  . Arthritis    fingers  . Blood clot in vein 1968  . Cataracts, bilateral   . Chronic kidney disease    CKD stage 2  . COPD (chronic obstructive pulmonary disease) ([place])   . Depression   . Diverticulitis 09/2013   with perforation  . Diverticulitis of colon with perforation 09/28/2013  . Dizziness    with decreased BP readings or bending over  . Dysrhythmia   . Floaters   . GERD (gastroesophageal reflux disease)   . H/O hiatal hernia   . Headache(784.0)    migraine every 5- to 6 years  . Heart murmur since age 1   mild  . Hepatitis age 60 or 48   had heapatitis, not sure which kind  . History of blood transfusion 1965  . History of  frequent urinary tract infections   . Hypercholesteremia   . Hypertension   . Hypokalemia   . Hypothyroid   . Obesity   . Pericolonic abscess 2015  . Pneumonia   . Shortness of breath dyspnea   . Sickle cell trait ([place])   . Wears glasses    Past Surgical History:  Procedure Laterality Date  . CHOLECYSTECTOMY  2002  . COLON RESECTION N/A 10/11/2013   Procedure: EXPLORATORY LAPAROTOMY WITH RECTOSIGMOID COLECTOMY AND END COLOSTOMY;  Surgeon: Stark Klein, MD;  Location: [facility];  Service: General;  Laterality: N/A;  . COLOSTOMY TAKEDOWN N/A 02/27/2014   Procedure: COLOSTOMY TAKEDOWN Valerie Jackson HERNIA REPAIR;  Surgeon: Stark Klein, MD;  Location: [facility];  Service: General;  Laterality: N/A;  . INCISIONAL HERNIA REPAIR N/A 03/04/2016   Procedure: LAPAROSCOPIC INCISIONAL HERNIA WITH MESH;  Surgeon: Ralene Ok, MD;  Location: [facility];  Service: General;  Laterality: N/A;  . LAPAROSCOPIC LYSIS OF ADHESIONS N/A 03/04/2016   Procedure: LAPAROSCOPIC LYSIS OF ADHESIONS;  Surgeon: Ralene Ok, MD;  Location: [facility];  Service: General;  Laterality: N/A;  . TONSILLECTOMY  1952   Family History  Problem Relation Age of Onset  . Hypertension Mother   . Aneurysm Mother   .  Cancer Mother     Uterine  . Cancer Other   . Arthritis Sister   . Hyperlipidemia Sister   . Hypertension Sister   . Stroke Sister   . Kidney disease Sister   . Hyperlipidemia Brother   . Thyroid cancer Daughter   . Breast cancer Maternal Grandmother   . Hypertension Maternal Grandmother   . Hypercalcemia Neg Hx    History  Sexual Activity  . Sexual activity: Not Currently    Outpatient Encounter Prescriptions as of 10/15/2016  Medication Sig  . calcium carbonate (OS-CAL - DOSED IN MG OF ELEMENTAL CALCIUM) 1250 (500 CA) MG tablet Take 1 tablet by mouth daily with breakfast. Reported on 04/03/2016  . cholecalciferol (VITAMIN D) 1000 UNITS tablet Take 1,000 Units by mouth daily.  [name] levothyroxine (SYNTHROID,  LEVOTHROID) 112 MCG tablet TAKE 1 TABLET EVERY DAY  . zolpidem (AMBIEN) 5 MG tablet Take 1 tablet (5 mg total) by mouth at bedtime as needed for sleep.   No facility-administered encounter medications on file as of 10/15/2016.     Activities of Daily Living In your present state of health, do you have any difficulty performing the following activities: 03/04/2016 03/03/2016  Hearing? N N  Vision? Y Y  Difficulty concentrating or making decisions? Y N  Walking or climbing stairs? Y Y  Dressing or bathing? N N  Doing errands, shopping? N N  Some recent data might be hidden    Patient Care Team: Janith Lima, MD as PCP - General (Internal Medicine)    Assessment:    Physical assessment deferred to PCP.  Exercise Activities and Dietary recommendations   Diet (meal preparation, eat out, water intake, caffeinated beverages, dairy products, fruits and vegetables): 2 meals per day, coffee 2-3 cups per week, water: 5-6 bottles per day Breakfast: eggs, sausage,  Lunch: grill cheese or [place] sanwich Dinner:    Chicken, tuna fish in salad Recommended to eat 4-5 small meals a day. Discussed to eat more protein. Goals    . patient (pt-stated)          More stress relief and balance; Getting out and meeting people and enjoying art;      Fall Risk Fall Risk  08/20/2016 07/17/2015 05/23/2015 07/19/2013 02/27/2013  Falls in the past year? No No No No No   Depression Screen PHQ 2/9 Scores 07/17/2015 05/23/2015 07/19/2013 02/27/2013  PHQ - 2 Score 2 2 0 0  PHQ- 9 Score 4 - - -     Cognitive Function       Ad8 score reviewed for issues:  Issues making decisions: no  Less interest in hobbies / activities:no  Repeats questions, stories (family complaining):no  Trouble using ordinary gadgets (microwave, computer, phone):no  Forgets the month or year: no  Mismanaging finances: no  Remembering appts:no  Daily problems with thinking and/or memory:no Ad8 score is=0     Immunization  History  Administered Date(s) Administered  . Influenza, High Dose Seasonal PF 07/19/2013  . Pneumococcal Conjugate-13 07/19/2013  . Pneumococcal Polysaccharide-23 09/25/2015  . Td 09/15/2011   Screening Tests Health Maintenance  Topic Date Due  . ZOSTAVAX  02/26/2005  . INFLUENZA VACCINE  12/12/2016 (Originally 04/14/2016)  . MAMMOGRAM  08/19/2017  . TETANUS/TDAP  09/14/2021  . COLONOSCOPY  02/09/2024  . DEXA SCAN  Completed  . Hepatitis C Screening  Completed  . PNA vac Low Risk Adult  Completed      Plan:     Continue to eat heart  healthy diet (full of fruits, vegetables, whole grains, lean protein, water--limit salt, fat, and sugar intake) and increase physical activity as tolerated.  Continue doing brain stimulating activities (puzzles, reading, adult coloring books, staying active) to keep memory sharp.    During the course of the visit the patient was educated and counseled about the following appropriate screening and preventive services:   Vaccines to include Pneumoccal, Influenza, Hepatitis B, Td, Zostavax, HCV  Cardiovascular Disease  Colorectal cancer screening  Bone density screening  Diabetes screening  Glaucoma screening  Mammography/PAP  Nutrition counseling   Patient Instructions (the written plan) was given to the patient.   Michiel Cowboy, RN  10/15/2016   Medical screening examination/treatment/procedure(s) were performed by non-physician practitioner and as supervising physician I was immediately available for consultation/collaboration. I agree with above. [name], MD

## 2016-11-04 ENCOUNTER — Ambulatory Visit (INDEPENDENT_AMBULATORY_CARE_PROVIDER_SITE_OTHER): Payer: Medicare Other

## 2016-11-04 DIAGNOSIS — R002 Palpitations: Secondary | ICD-10-CM

## 2016-11-04 DIAGNOSIS — I493 Ventricular premature depolarization: Secondary | ICD-10-CM | POA: Diagnosis not present

## 2016-11-11 ENCOUNTER — Encounter: Payer: Self-pay | Admitting: Internal Medicine

## 2016-11-11 ENCOUNTER — Ambulatory Visit (INDEPENDENT_AMBULATORY_CARE_PROVIDER_SITE_OTHER): Payer: Medicare Other | Admitting: Internal Medicine

## 2016-11-11 VITALS — BP 148/82 | HR 81 | Temp 97.7°F | Resp 16 | Ht 65.0 in | Wt 204.0 lb

## 2016-11-11 DIAGNOSIS — I10 Essential (primary) hypertension: Secondary | ICD-10-CM

## 2016-11-11 DIAGNOSIS — G43009 Migraine without aura, not intractable, without status migrainosus: Secondary | ICD-10-CM | POA: Insufficient documentation

## 2016-11-11 DIAGNOSIS — I493 Ventricular premature depolarization: Secondary | ICD-10-CM

## 2016-11-11 MED ORDER — SUMATRIPTAN-NAPROXEN SODIUM 85-500 MG PO TABS: 1.0000 | ORAL_TABLET | ORAL | 1 refills | Status: DC | PRN

## 2016-11-11 NOTE — Patient Instructions (Signed)
Migraine Headache A migraine headache is an intense, throbbing pain on one side or both sides of the head. Migraines may also cause other symptoms, such as nausea, vomiting, and sensitivity to light and noise. What are the causes? Doing or taking certain things may also trigger migraines, such as:  Alcohol.  Smoking.  Medicines, such as: ? Medicine used to treat chest pain (nitroglycerine). ? Birth control pills. ? Estrogen pills. ? Certain blood pressure medicines.  Aged cheeses, chocolate, or caffeine.  Foods or drinks that contain nitrates, glutamate, aspartame, or tyramine.  Physical activity.  Other things that may trigger a migraine include:  Menstruation.  Pregnancy.  Hunger.  Stress, lack of sleep, too much sleep, or fatigue.  Weather changes.  What increases the risk? The following factors may make you more likely to experience migraine headaches:  Age. Risk increases with age.  Family history of migraine headaches.  Being Caucasian.  Depression and anxiety.  Obesity.  Being a woman.  Having a hole in the heart (patent foramen ovale) or other heart problems.  What are the signs or symptoms? The main symptom of this condition is pulsating or throbbing pain. Pain may:  Happen in any area of the head, such as on one side or both sides.  Interfere with daily activities.  Get worse with physical activity.  Get worse with exposure to bright lights or loud noises.  Other symptoms may include:  Nausea.  Vomiting.  Dizziness.  General sensitivity to bright lights, loud noises, or smells.  Before you get a migraine, you may get warning signs that a migraine is developing (aura). An aura may include:  Seeing flashing lights or having blind spots.  Seeing bright spots, halos, or zigzag lines.  Having tunnel vision or blurred vision.  Having numbness or a tingling feeling.  Having trouble talking.  Having muscle weakness.  How is this  diagnosed? A migraine headache can be diagnosed based on:  Your symptoms.  A physical exam.  Tests, such as CT scan or MRI of the head. These imaging tests can help rule out other causes of headaches.  Taking fluid from the spine (lumbar puncture) and analyzing it (cerebrospinal fluid analysis, or CSF analysis).  How is this treated? A migraine headache is usually treated with medicines that:  Relieve pain.  Relieve nausea.  Prevent migraines from coming back.  Treatment may also include:  Acupuncture.  Lifestyle changes like avoiding foods that trigger migraines.  Follow these instructions at home: Medicines  Take over-the-counter and prescription medicines only as told by your health care provider.  Do not drive or use heavy machinery while taking prescription pain medicine.  To prevent or treat constipation while you are taking prescription pain medicine, your health care provider may recommend that you: ? Drink enough fluid to keep your urine clear or pale yellow. ? Take over-the-counter or prescription medicines. ? Eat foods that are high in fiber, such as fresh fruits and vegetables, whole grains, and beans. ? Limit foods that are high in fat and processed sugars, such as fried and sweet foods. Lifestyle  Avoid alcohol use.  Do not use any products that contain nicotine or tobacco, such as cigarettes and e-cigarettes. If you need help quitting, ask your health care provider.  Get at least 8 hours of sleep every night.  Limit your stress. General instructions   Keep a journal to find out what may trigger your migraine headaches. For example, write down: ? What you eat and   drink. ? How much sleep you get. ? Any change to your diet or medicines.  If you have a migraine: ? Avoid things that make your symptoms worse, such as bright lights. ? It may help to lie down in a dark, quiet room. ? Do not drive or use heavy machinery. ? Ask your health care provider  what activities are safe for you while you are experiencing symptoms.  Keep all follow-up visits as told by your health care provider. This is important. Contact a health care provider if:  You develop symptoms that are different or more severe than your usual migraine symptoms. Get help right away if:  Your migraine becomes severe.  You have a fever.  You have a stiff neck.  You have vision loss.  Your muscles feel weak or like you cannot control them.  You start to lose your balance often.  You develop trouble walking.  You faint. This information is not intended to replace advice given to you by your health care provider. Make sure you discuss any questions you have with your health care provider. Document Released: 08/31/2005 Document Revised: 03/20/2016 Document Reviewed: 02/17/2016 Elsevier Interactive Patient Education  2017 [employer]   

## 2016-11-11 NOTE — Progress Notes (Signed)
Subjective:  Patient ID: Valerie Jackson, female    DOB: 04-Dec-1945  Age: 71 y.o. MRN: [id number]  CC: Headache   HPI Valerie Jackson presents for concerns about a headache that started 2 days ago. She has had intermittent headaches for over 4 years and was previously told that they are migraine headaches. She describes the pain as a mild discomfort that usually resolved with a caffeinated beverage, BC powder and Tylenol but today she has a headache that has not totally resolved. She denies changes in her hearing or vision, she denies nausea, vomiting, numbness, weakness, or tingling. She complains at the onset of the headache she has changes in her peripheral vision. She previously saw a neuro ophthalmologist and was told that this was a migraine variant.  She continues to complain of intermittent and random palpitations. She recently completed a 24-hour Holter monitor but the results have not been reported yet.  Outpatient Medications Prior to Visit  Medication Sig Dispense Refill  . cholecalciferol (VITAMIN D) 1000 UNITS tablet Take 1,000 Units by mouth daily.    [name] levothyroxine (SYNTHROID, LEVOTHROID) 112 MCG tablet TAKE 1 TABLET EVERY DAY 90 tablet 1  . zolpidem (AMBIEN) 5 MG tablet Take 1 tablet (5 mg total) by mouth at bedtime as needed for sleep. 30 tablet 5  . calcium carbonate (OS-CAL - DOSED IN MG OF ELEMENTAL CALCIUM) 1250 (500 CA) MG tablet Take 1 tablet by mouth daily with breakfast. Reported on 04/03/2016     No facility-administered medications prior to visit.     ROS Review of Systems  Constitutional: Negative for chills, fatigue, fever and unexpected weight change.  HENT: Negative.  Negative for sinus pressure and trouble swallowing.   Eyes: Negative for photophobia and visual disturbance.  Respiratory: Negative.  Negative for choking, chest tightness, shortness of breath and wheezing.   Cardiovascular: Positive for palpitations. Negative for chest pain and leg swelling.   Gastrointestinal: Negative.  Negative for abdominal pain, constipation, diarrhea, nausea and vomiting.  Endocrine: Negative.   Genitourinary: Negative.   Musculoskeletal: Negative.  Negative for back pain, myalgias, neck pain and neck stiffness.  Skin: Negative.  Negative for color change and rash.  Neurological: Positive for headaches. Negative for dizziness, tremors, syncope, facial asymmetry, speech difficulty, weakness and numbness.  Hematological: Negative.  Negative for adenopathy. Does not bruise/bleed easily.  Psychiatric/Behavioral: Negative.     Objective:  BP (!) 148/82 (BP Location: Left Arm, Patient Position: Sitting, Cuff Size: Normal)   Pulse 81   Temp 97.7 F (36.5 C) (Oral)   Resp 16   Ht 5' 5"  (1.651 m)   Wt 204 lb (92.5 kg)   SpO2 98%   BMI 33.95 kg/m   BP Readings from Last 3 Encounters:  11/11/16 (!) 148/82  10/15/16 126/78  10/13/16 118/82    Wt Readings from Last 3 Encounters:  11/11/16 204 lb (92.5 kg)  10/15/16 201 lb (91.2 kg)  10/13/16 201 lb (91.2 kg)    Physical Exam  Constitutional: She is oriented to person, place, and time. She appears well-developed and well-nourished.  Non-toxic appearance. She does not have a sickly appearance. She does not appear ill. No distress.  HENT:  Mouth/Throat: Oropharynx is clear and moist. No oropharyngeal exudate.  Eyes: Conjunctivae and EOM are normal. Pupils are equal, round, and reactive to light. Right eye exhibits no discharge. Left eye exhibits no discharge. No scleral icterus.  Neck: Normal range of motion. Neck supple. No JVD present. No tracheal  deviation present. No thyromegaly present.  Cardiovascular: Normal rate, regular rhythm, normal heart sounds and intact distal pulses.  Exam reveals no gallop and no friction rub.   No murmur heard. Pulmonary/Chest: Effort normal and breath sounds normal. No stridor. No respiratory distress. She has no wheezes. She has no rales. She exhibits no tenderness.    Abdominal: Soft. Bowel sounds are normal. She exhibits no distension and no mass. There is no tenderness. There is no rebound and no guarding.  Musculoskeletal: Normal range of motion. She exhibits no edema, tenderness or deformity.  Lymphadenopathy:    She has no cervical adenopathy.  Neurological: She is alert and oriented to person, place, and time. She has normal reflexes. She displays normal reflexes. No cranial nerve deficit. She exhibits normal muscle tone. Coordination normal.  Skin: Skin is warm and dry. No rash noted. She is not diaphoretic. No erythema. No pallor.  Psychiatric: She has a normal mood and affect. Her behavior is normal. Judgment and thought content normal.  Vitals reviewed.   Lab Results  Component Value Date   WBC 7.8 10/05/2016   HGB 12.7 10/05/2016   HCT 36.7 10/05/2016   PLT 230.0 10/05/2016   GLUCOSE 100 (H) 10/05/2016   CHOL 229 (H) 10/05/2016   TRIG 166.0 (H) 10/05/2016   HDL 50.60 10/05/2016   LDLDIRECT 226.0 02/27/2013   LDLCALC 145 (H) 10/05/2016   ALT 19 05/19/2016   AST 17 05/19/2016   NA 138 10/05/2016   K 4.1 10/05/2016   CL 103 10/05/2016   CREATININE 1.42 (H) 10/05/2016   BUN 21 10/05/2016   CO2 29 10/05/2016   TSH 0.65 10/13/2016   INR 1.10 03/03/2016   HGBA1C 4.7 05/14/2014    No results found.  Assessment & Plan:   Valerie Jackson was seen today for headache.  Diagnoses and all orders for this visit:  Essential hypertension- her blood pressure is well-controlled  Migraine without aura and without status migrainosus, not intractable- I've asked her to try a triptan and an anti-inflammatory treat this -     SUMAtriptan-naproxen (TREXIMET) 85-500 MG tablet; Take 1 tablet by mouth every 2 (two) hours as needed for migraine.  Symptomatic PVCs- palpitations continue to sound benign, I await the results of the Holter monitor to see if she has something more ominous than PVCs.   I am having Valerie Jackson start on SUMAtriptan-naproxen. I am  also having her maintain her zolpidem, cholecalciferol, calcium carbonate, and levothyroxine.  Meds ordered this encounter  Medications  . SUMAtriptan-naproxen (TREXIMET) 85-500 MG tablet    Sig: Take 1 tablet by mouth every 2 (two) hours as needed for migraine.    Dispense:  10 tablet    Refill:  1     Follow-up: Return if symptoms worsen or fail to improve.  [name], MD

## 2016-11-11 NOTE — Progress Notes (Signed)
Pre visit review using our clinic review tool, if applicable. No additional management support is needed unless otherwise documented below in the visit note. 

## 2016-11-12 ENCOUNTER — Ambulatory Visit: Payer: Medicare Other | Admitting: Internal Medicine

## 2016-11-30 ENCOUNTER — Encounter: Payer: Self-pay | Admitting: Internal Medicine

## 2016-11-30 ENCOUNTER — Other Ambulatory Visit: Payer: Self-pay | Admitting: Internal Medicine

## 2016-11-30 DIAGNOSIS — I493 Ventricular premature depolarization: Secondary | ICD-10-CM

## 2016-12-10 NOTE — Progress Notes (Signed)
Cardiology Office Note   Date:  12/11/2016   ID:  Valerie, Jackson 03-15-1946, MRN [id number]  PCP:  [name], MD  Cardiologist:   Minus Breeding, MD  Referring:  [name], MD  Chief Complaint  Patient presents with  . Shortness of Breath      History of Present Illness: Valerie Jackson is a 71 y.o. female who presents for dyspnea    She saw Dr. Ronnald Ramp and mentioned palpitations.   She was found to have frequent PVCs with probable nonsustained atrial tachycardia.   She did have a POET (Plain Old Exercise Treadmill) last year with no evidence for ischemia but with frequent PVCs.    She has no past cardiac history. She's been short of breath with activity for some years but it's getting worse. She short of breath if she has to climb stairs more than once. She'll let to go lay down he gets less severe. She'll have it less severely with less activity. She's not describing PND or orthopnea. She doesn't describe chest pressure, neck or arm discomfort. She does feel palpitations occasionally. These can be a brief racing episode or skipping beats. She's not describing any presyncope or syncope. I see in her list of medical problems COPD but she doesn't know anything about this diagnosis.   Past Medical History:  Diagnosis Date  . Arthritis    fingers  . Blood clot in vein 1968  . Cataracts, bilateral   . Chronic kidney disease    CKD stage 2  . COPD (chronic obstructive pulmonary disease) ([place])   . Depression   . Diverticulitis 09/2013   with perforation  . Dizziness    with decreased BP readings or bending over  . Floaters   . GERD (gastroesophageal reflux disease)   . H/O hiatal hernia   . Headache(784.0)    migraine every 5- to 6 years  . Heart murmur since age 45   mild  . Hepatitis age 71 or 40   had heapatitis, not sure which kind  . History of blood transfusion 1965  . History of frequent urinary tract infections   . Hypercholesteremia   . Hypertension   .  Hypothyroid   . Obesity   . Pericolonic abscess 2015  . Pneumonia   . Sickle cell trait [facility])     Past Surgical History:  Procedure Laterality Date  . CHOLECYSTECTOMY  2002  . COLON RESECTION N/A 10/11/2013   Procedure: EXPLORATORY LAPAROTOMY WITH RECTOSIGMOID COLECTOMY AND END COLOSTOMY;  Surgeon: Stark Klein, MD;  Location: [facility];  Service: General;  Laterality: N/A;  . COLOSTOMY TAKEDOWN N/A 02/27/2014   Procedure: COLOSTOMY TAKEDOWN [name] HERNIA REPAIR;  Surgeon: Stark Klein, MD;  Location: [facility];  Service: General;  Laterality: N/A;  . INCISIONAL HERNIA REPAIR N/A 03/04/2016   Procedure: LAPAROSCOPIC INCISIONAL HERNIA WITH MESH;  Surgeon: Ralene Ok, MD;  Location: [facility];  Service: General;  Laterality: N/A;  . LAPAROSCOPIC LYSIS OF ADHESIONS N/A 03/04/2016   Procedure: LAPAROSCOPIC LYSIS OF ADHESIONS;  Surgeon: Ralene Ok, MD;  Location: [facility];  Service: General;  Laterality: N/A;  . TONSILLECTOMY  1952     Current Outpatient Prescriptions  Medication Sig Dispense Refill  . calcium carbonate (OS-CAL - DOSED IN MG OF ELEMENTAL CALCIUM) 1250 (500 CA) MG tablet Take 1 tablet by mouth daily with breakfast. Reported on 04/03/2016    . cholecalciferol (VITAMIN D) 1000 UNITS tablet Take 1,000 Units by mouth daily.    [name]  levothyroxine (SYNTHROID, LEVOTHROID) 112 MCG tablet TAKE 1 TABLET EVERY DAY 90 tablet 1  . SUMAtriptan-naproxen (TREXIMET) 85-500 MG tablet Take 1 tablet by mouth every 2 (two) hours as needed for migraine. 10 tablet 1  . zolpidem (AMBIEN) 5 MG tablet Take 1 tablet (5 mg total) by mouth at bedtime as needed for sleep. 30 tablet 5   No current facility-administered medications for this visit.     Allergies:   Estrogens    Social History:  The patient  reports that she quit smoking about 4 years ago. Her smoking use included Cigarettes. She has a 25.00 pack-year smoking history. She has never used smokeless tobacco. She reports that she does not drink  alcohol or use drugs.   Family History:  The patient's family history includes Aneurysm in her mother; Arthritis in her sister; Breast cancer in her maternal grandmother; Cancer in her mother and other; Glaucoma in her brother; Hyperlipidemia in her brother and sister; Hypertension in her maternal grandmother, mother, and sister; Kidney disease in her sister; Stroke in her sister; Thyroid cancer in her daughter.    ROS:  Please see the history of present illness.   Otherwise, review of systems are positive for frequent defecation.   All other systems are reviewed and negative.    PHYSICAL EXAM: VS:  BP (!) 132/94   Pulse 100   Ht 5' 5.5" (1.664 m)   Wt 201 lb (91.2 kg)   BMI 32.94 kg/m  , BMI Body mass index is 32.94 kg/m. GENERAL:  Well appearing HEENT:  Pupils equal round and reactive, fundi not visualized, oral mucosa unremarkable NECK:  No jugular venous distention, waveform within normal limits, carotid upstroke brisk and symmetric, no bruits, no thyromegaly LYMPHATICS:  No cervical, inguinal adenopathy LUNGS:  Clear to auscultation bilaterally BACK:  No CVA tenderness CHEST:  Unremarkable HEART:  PMI not displaced or sustained,S1 and S2 within normal limits, no S3, no S4, no clicks, no rubs, no murmurs ABD:  Flat, positive bowel sounds normal in frequency in pitch, no bruits, no rebound, no guarding, no midline pulsatile mass, no hepatomegaly, no splenomegaly EXT:  2 plus pulses throughout, no edema, no cyanosis no clubbing SKIN:  No rashes no nodules NEURO:  Cranial nerves II through XII grossly intact, motor grossly intact throughout PSYCH:  Cognitively intact, oriented to person place and time    EKG:  EKG is not ordered today. The ekg ordered 09/26/16 demonstrates NSR, rate 74, axis WNL, intervals WNL, PVCs, leftward axis, no aucte ST T wave changes.     Recent Labs: 02/25/2016: Magnesium 2.3 05/19/2016: ALT 19 10/05/2016: BUN 21; Creatinine, Ser 1.42; Hemoglobin 12.7;  Platelets 230.0; Potassium 4.1; Sodium 138 10/13/2016: TSH 0.65    Lipid Panel    Component Value Date/Time   CHOL 229 (H) 10/05/2016 1042   TRIG 166.0 (H) 10/05/2016 1042   HDL 50.60 10/05/2016 1042   CHOLHDL 5 10/05/2016 1042   VLDL 33.2 10/05/2016 1042   LDLCALC 145 (H) 10/05/2016 1042   LDLDIRECT 226.0 02/27/2013 1558     Lab Results  Component Value Date   TSH 0.65 10/13/2016    Wt Readings from Last 3 Encounters:  12/11/16 201 lb (91.2 kg)  11/11/16 204 lb (92.5 kg)  10/15/16 201 lb (91.2 kg)      Other studies Reviewed: Additional studies/ records that were reviewed today include:   POET (Plain Old Exercise Treadmill) , Holter Review of the above records demonstrates:  Please see elsewhere  in the note.     ASSESSMENT AND PLAN:   PALPITATIONS:   She had 11% PVCs on her Holter.  She has had normal electrolytes and thyroid. I'm going to go ahead and check an echocardiogram.  DYSPNEA:  This is her biggest complaint.  I will check a BNP and echo as above.  If these are normal I will order PFTs.    Current medicines are reviewed at length with the patient today.  The patient does not have concerns regarding medicines.  The following changes have been made:  no change  Labs/ tests ordered today include:   Orders Placed This Encounter  Procedures  . B Nat Peptide  . ECHOCARDIOGRAM COMPLETE     Disposition:   FU with me as needed after the above testing.     Signed, Minus Breeding, MD  12/11/2016 2:07 PM    [place]

## 2016-12-11 ENCOUNTER — Encounter: Payer: Self-pay | Admitting: Cardiology

## 2016-12-11 ENCOUNTER — Ambulatory Visit (INDEPENDENT_AMBULATORY_CARE_PROVIDER_SITE_OTHER): Payer: Medicare Other | Admitting: Cardiology

## 2016-12-11 VITALS — BP 132/94 | HR 100 | Ht 65.5 in | Wt 201.0 lb

## 2016-12-11 DIAGNOSIS — I493 Ventricular premature depolarization: Secondary | ICD-10-CM | POA: Diagnosis not present

## 2016-12-11 DIAGNOSIS — R0602 Shortness of breath: Secondary | ICD-10-CM

## 2016-12-11 NOTE — Patient Instructions (Signed)
Medication Instructions:  Continue current medications  Labwork: BNP  Testing/Procedures: Your physician has requested that you have an echocardiogram. Echocardiography is a painless test that uses sound waves to create images of your heart. It provides your doctor with information about the size and shape of your heart and how well your heart's chambers and valves are working. This procedure takes approximately one hour. There are no restrictions for this procedure.    Follow-Up: Your physician recommends that you schedule a follow-up appointment in: As Needed   Any Other Special Instructions Will Be Listed Below (If Applicable).     If you need a refill on your cardiac medications before your next appointment, please call your pharmacy.

## 2016-12-14 DIAGNOSIS — R0602 Shortness of breath: Secondary | ICD-10-CM | POA: Diagnosis not present

## 2016-12-15 LAB — BRAIN NATRIURETIC PEPTIDE: Brain Natriuretic Peptide: 31.3 pg/mL (ref <100)

## 2016-12-29 ENCOUNTER — Other Ambulatory Visit (HOSPITAL_COMMUNITY): Payer: Medicare Other

## 2016-12-30 ENCOUNTER — Ambulatory Visit (HOSPITAL_COMMUNITY): Payer: Medicare Other | Attending: Cardiovascular Disease

## 2016-12-30 ENCOUNTER — Other Ambulatory Visit: Payer: Self-pay

## 2016-12-30 DIAGNOSIS — R0602 Shortness of breath: Secondary | ICD-10-CM | POA: Insufficient documentation

## 2016-12-30 DIAGNOSIS — I10 Essential (primary) hypertension: Secondary | ICD-10-CM | POA: Insufficient documentation

## 2016-12-30 DIAGNOSIS — E785 Hyperlipidemia, unspecified: Secondary | ICD-10-CM | POA: Diagnosis not present

## 2016-12-30 DIAGNOSIS — I493 Ventricular premature depolarization: Secondary | ICD-10-CM | POA: Insufficient documentation

## 2016-12-30 DIAGNOSIS — R002 Palpitations: Secondary | ICD-10-CM | POA: Diagnosis not present

## 2016-12-31 ENCOUNTER — Telehealth: Payer: Self-pay | Admitting: *Deleted

## 2016-12-31 DIAGNOSIS — R0602 Shortness of breath: Secondary | ICD-10-CM

## 2016-12-31 NOTE — Telephone Encounter (Signed)
-----   Message from Minus Breeding, MD sent at 12/31/2016  8:27 AM EDT ----- No significant findings on the echo.  Order PFTs.  Follow up with me after the PFTs. Call Ms. Slape with the results and send results to [name], MD

## 2016-12-31 NOTE — Telephone Encounter (Signed)
Pt aware of her Echo, PFT ordered and send to scheduler to be schedule

## 2017-01-14 ENCOUNTER — Ambulatory Visit (HOSPITAL_COMMUNITY)
Admission: RE | Admit: 2017-01-14 | Discharge: 2017-01-14 | Disposition: A | Discharge status: Home / Self Care | Payer: Medicare Other | Source: Ambulatory Visit | Attending: Cardiology | Admitting: Cardiology

## 2017-01-14 DIAGNOSIS — R0602 Shortness of breath: Secondary | ICD-10-CM | POA: Diagnosis not present

## 2017-01-14 DIAGNOSIS — J449 Chronic obstructive pulmonary disease, unspecified: Secondary | ICD-10-CM | POA: Insufficient documentation

## 2017-01-14 LAB — PULMONARY FUNCTION TEST
DL/VA % pred: 69 %
DL/VA: 3.49 ml/min/mmHg/L
DLCO unc % pred: 43 %
DLCO unc: 11.81 ml/min/mmHg
FEF 25-75 Post: 1.46 L/sec
FEF 25-75 Pre: 1.16 L/sec
FEF2575-%Change-Post: 25 %
FEF2575-%Pred-Post: 82 %
FEF2575-%Pred-Pre: 65 %
FEV1-%Change-Post: 12 %
FEV1-%Pred-Post: 88 %
FEV1-%Pred-Pre: 78 %
FEV1-Post: 1.74 L
FEV1-Pre: 1.55 L
FEV1FVC-%Change-Post: 7 %
FEV1FVC-%Pred-Pre: 90 %
FEV6-%Change-Post: 4 %
FEV6-%Pred-Post: 94 %
FEV6-%Pred-Pre: 90 %
FEV6-Post: 2.31 L
FEV6-Pre: 2.22 L
FEV6FVC-%Change-Post: 0 %
FEV6FVC-%Pred-Post: 104 %
FEV6FVC-%Pred-Pre: 103 %
FVC-%Change-Post: 5 %
FVC-%Pred-Post: 92 %
FVC-%Pred-Pre: 87 %
FVC-Post: 2.35 L
FVC-Pre: 2.23 L
Post FEV1/FVC ratio: 74 %
Post FEV6/FVC ratio: 100 %
Pre FEV1/FVC ratio: 69 %
Pre FEV6/FVC Ratio: 99 %
RV % pred: 79 %
RV: 1.85 L
TLC % pred: 76 %
TLC: 4.08 L

## 2017-01-14 MED ORDER — ALBUTEROL SULFATE (2.5 MG/3ML) 0.083% IN NEBU
2.5000 mg | INHALATION_SOLUTION | Freq: Once | RESPIRATORY_TRACT | Status: AC
  Administered 2017-01-14: 2.5 mg via RESPIRATORY_TRACT

## 2017-01-20 NOTE — Progress Notes (Signed)
Cardiology Office Note   Date:  01/21/2017   ID:  Pearley, [place]ton Jan 20, 1946, MRN [id number]  PCP:  Janith Lima, MD  Cardiologist:   Minus Breeding, MD  Referring:  Janith Lima, MD  Chief Complaint  Patient presents with  . Shortness of Breath      History of Present Illness: Valerie Jackson is a 71 y.o. female who presents for dyspnea and palpitations.   She was found to have frequent PVCs with probable nonsustained atrial tachycardia.   She did have a POET (Plain Old Exercise Treadmill) last year with no evidence for ischemia but with frequent PVCs.  At the last visit echo was normal.  BNP was normal.  I have ordered PFTs.  These demonstrated diffusing capacity disorder suggestive of an early parenchymal process. She's had no new chest pressure, neck or arm discomfort. She does get tachycardia with any activity. She is breathless with mild activity. She's not describing PND or orthopnea however.   Past Medical History:  Diagnosis Date  . Arthritis    fingers  . Blood clot in vein 1968  . Cataracts, bilateral   . Chronic kidney disease    CKD stage 2  . COPD (chronic obstructive pulmonary disease) ([place])   . Depression   . Diverticulitis 09/2013   with perforation  . Dizziness    with decreased BP readings or bending over  . Floaters   . GERD (gastroesophageal reflux disease)   . H/O hiatal hernia   . Headache(784.0)    migraine every 5- to 6 years  . Heart murmur since age 71   mild  . Hepatitis age 21 or 43   had heapatitis, not sure which kind  . History of blood transfusion 1965  . History of frequent urinary tract infections   . Hypercholesteremia   . Hypertension   . Hypothyroid   . Obesity   . Pericolonic abscess 2015  . Pneumonia   . Sickle cell trait [facility])     Past Surgical History:  Procedure Laterality Date  . CHOLECYSTECTOMY  2002  . COLON RESECTION N/A 10/11/2013   Procedure: EXPLORATORY LAPAROTOMY WITH RECTOSIGMOID COLECTOMY AND  END COLOSTOMY;  Surgeon: Stark Klein, MD;  [facility];  Service: General;  Laterality: N/A;  . COLOSTOMY TAKEDOWN N/A 02/27/2014   Procedure: COLOSTOMY TAKEDOWN [name] HERNIA REPAIR;  Surgeon: Stark Klein, MD;  [facility];  Service: General;  Laterality: N/A;  . INCISIONAL HERNIA REPAIR N/A 03/04/2016   Procedure: LAPAROSCOPIC INCISIONAL HERNIA WITH MESH;  Surgeon: Ralene Ok, MD;  [facility];  Service: General;  Laterality: N/A;  . LAPAROSCOPIC LYSIS OF ADHESIONS N/A 03/04/2016   Procedure: LAPAROSCOPIC LYSIS OF ADHESIONS;  Surgeon: Ralene Ok, MD;  [facility];  Service: General;  Laterality: N/A;  . TONSILLECTOMY  1952     Current Outpatient Prescriptions  Medication Sig Dispense Refill  . calcium carbonate (OS-CAL - DOSED IN MG OF ELEMENTAL CALCIUM) 1250 (500 CA) MG tablet Take 1 tablet by mouth daily with breakfast. Reported on 04/03/2016    . cholecalciferol (VITAMIN D) 1000 UNITS tablet Take 1,000 Units by mouth daily.    [name] levothyroxine (SYNTHROID, LEVOTHROID) 112 MCG tablet TAKE 1 TABLET EVERY DAY 90 tablet 1  . SUMAtriptan-naproxen (TREXIMET) 85-500 MG tablet Take 1 tablet by mouth every 2 (two) hours as needed for migraine. 10 tablet 1  . zolpidem (AMBIEN) 5 MG tablet Take 1 tablet (5 mg total) by mouth  at bedtime as needed for sleep. 30 tablet 5   No current facility-administered medications for this visit.     Allergies:   Estrogens    ROS:  Please see the history of present illness.   Otherwise, review of systems are positive for frequent none.   All other systems are reviewed and negative.    PHYSICAL EXAM: VS:  BP 124/82   Pulse 90   Ht 5' 5"  (1.651 m)   Wt 203 lb (92.1 kg)   BMI 33.78 kg/m  , BMI Body mass index is 33.78 kg/m. GENERAL:  Well appearing, and in no distress NECK:  No jugular venous distention, waveform within normal limits, carotid upstroke brisk and symmetric, no bruits, no thyromegaly LUNGS:  Clear to  auscultation bilaterally BACK:  No CVA tenderness CHEST:  Unremarkable HEART:  PMI not displaced or sustained,S1 and S2 within normal limits, no S3, no S4, no clicks, no rubs, no murmurs ABD:  Flat, positive bowel sounds normal in frequency in pitch, no bruits, no rebound, no guarding, no midline pulsatile mass, no hepatomegaly, no splenomegaly EXT:  2 plus pulses throughout, no edema, no cyanosis no clubbing    EKG:  EKG is not  ordered today.      Recent Labs: 02/25/2016: Magnesium 2.3 05/19/2016: ALT 19 10/05/2016: BUN 21; Creatinine, Ser 1.42; Hemoglobin 12.7; Platelets 230.0; Potassium 4.1; Sodium 138 10/13/2016: TSH 0.65 12/14/2016: Brain Natriuretic Peptide 31.3    Lipid Panel    Component Value Date/Time   CHOL 229 (H) 10/05/2016 1042   TRIG 166.0 (H) 10/05/2016 1042   HDL 50.60 10/05/2016 1042   CHOLHDL 5 10/05/2016 1042   VLDL 33.2 10/05/2016 1042   LDLCALC 145 (H) 10/05/2016 1042   LDLDIRECT 226.0 02/27/2013 1558     Lab Results  Component Value Date   TSH 0.65 10/13/2016    Wt Readings from Last 3 Encounters:  01/21/17 203 lb (92.1 kg)  12/11/16 201 lb (91.2 kg)  11/11/16 204 lb (92.5 kg)      Other studies Reviewed: Additional studies/ records that were reviewed today include:   Labs, echo.  Review of the above records demonstrates:  Please see elsewhere in the note.     ASSESSMENT AND PLAN:   PALPITATIONS:   She had 11% PVCs on her Holter.  She has had normal electrolytes and thyroid.   Echo was unremarkable.  No further testing is indicated as these are not particularly symptomatic.    DYSPNEA:  BNP was normal.  Echo was unremarkable. She has abnormal pulmonary function test and I will set her up to see Dr. Annamaria Boots  I will check p.m. lateral chest x-ray. Of note her oxygen saturation stayed at 93% when she walks her heart rate went up to 113 and she appeared to be quite dyspneic.    Current medicines are reviewed at length with the patient today.  The  patient does not have concerns regarding medicines.  The following changes have been made:  None  Labs/ tests ordered today include:    Orders Placed This Encounter  Procedures  . DG Chest 2 View  . Ambulatory referral to Pulmonology     Disposition:   FU with me as needed after the pulmonary work up.    Signed, Minus Breeding, MD  01/21/2017 1:19 PM    [facility]

## 2017-01-21 ENCOUNTER — Encounter: Payer: Self-pay | Admitting: Cardiology

## 2017-01-21 ENCOUNTER — Ambulatory Visit (INDEPENDENT_AMBULATORY_CARE_PROVIDER_SITE_OTHER): Payer: Medicare Other | Admitting: Cardiology

## 2017-01-21 ENCOUNTER — Ambulatory Visit
Admission: RE | Admit: 2017-01-21 | Discharge: 2017-01-21 | Disposition: A | Discharge status: Home / Self Care | Payer: Medicare Other | Source: Ambulatory Visit | Attending: Cardiology | Admitting: Cardiology

## 2017-01-21 VITALS — BP 124/82 | HR 90 | Ht 65.0 in | Wt 203.0 lb

## 2017-01-21 DIAGNOSIS — R0602 Shortness of breath: Secondary | ICD-10-CM

## 2017-01-21 DIAGNOSIS — R002 Palpitations: Secondary | ICD-10-CM | POA: Diagnosis not present

## 2017-01-21 DIAGNOSIS — R942 Abnormal results of pulmonary function studies: Secondary | ICD-10-CM

## 2017-01-21 NOTE — Patient Instructions (Signed)
Medication Instructions:  Continue current medications  Labwork: None Ordered  Testing/Procedures: A chest x-ray takes a picture of the organs and structures inside the chest, including the heart, lungs, and blood vessels. This test can show several things, including, whether the heart is enlarges; whether fluid is building up in the lungs; and whether pacemaker / defibrillator leads are still in place.   Follow-Up: Your physician recommends that you schedule a follow-up appointment in: As Needed   Any Other Special Instructions Will Be Listed Below (If Applicable).   If you need a refill on your cardiac medications before your next appointment, please call your pharmacy.

## 2017-01-26 ENCOUNTER — Telehealth: Payer: Self-pay | Admitting: Cardiology

## 2017-01-26 NOTE — Telephone Encounter (Signed)
New Message    Pt wants to know when you are sending referral for pulmonology

## 2017-01-27 ENCOUNTER — Encounter: Payer: Self-pay | Admitting: Gynecology

## 2017-01-27 NOTE — Telephone Encounter (Signed)
Left  Detailed message on answer machine- office will contact pulm. office - make appointment and contact patient back any question may call back

## 2017-01-27 NOTE — Telephone Encounter (Signed)
Left message for patient reagaarding appointment with Dr. Lake Bells [facility] ) Tuesday 02/23/17 @ 2:30pm.  Also gave address and telephone number----I requested a confirmation call from the patient.

## 2017-01-29 NOTE — Telephone Encounter (Signed)
Follow up    Pt is calling to inform Sharyn Lull she received her message.

## 2017-01-29 NOTE — Telephone Encounter (Signed)
Left detailed message(DPR) about pulm-Dr. Lake Bells [facility] ) Tuesday 02/23/17 @ 2:30pm.

## 2017-02-05 ENCOUNTER — Encounter: Payer: Self-pay | Admitting: *Deleted

## 2017-02-23 ENCOUNTER — Encounter: Payer: Self-pay | Admitting: Pulmonary Disease

## 2017-02-23 ENCOUNTER — Ambulatory Visit (INDEPENDENT_AMBULATORY_CARE_PROVIDER_SITE_OTHER): Payer: Medicare Other | Admitting: Pulmonary Disease

## 2017-02-23 VITALS — BP 124/82 | HR 97 | Ht 65.0 in | Wt 202.6 lb

## 2017-02-23 DIAGNOSIS — R0600 Dyspnea, unspecified: Secondary | ICD-10-CM | POA: Diagnosis not present

## 2017-02-23 DIAGNOSIS — J439 Emphysema, unspecified: Secondary | ICD-10-CM | POA: Diagnosis not present

## 2017-02-23 DIAGNOSIS — R942 Abnormal results of pulmonary function studies: Secondary | ICD-10-CM

## 2017-02-23 LAB — NITRIC OXIDE: Nitric Oxide: 22

## 2017-02-23 MED ORDER — BECLOMETHASONE DIPROPIONATE 80 MCG/ACT IN AERS: 2.0000 | INHALATION_SPRAY | Freq: Two times a day (BID) | RESPIRATORY_TRACT | 0 refills | Status: DC

## 2017-02-23 NOTE — Assessment & Plan Note (Addendum)
She has a heavy smoking history and airflow obstruction on pulmonary function testing which is reversible. So she does not have COPD, lung function testing was actually suggestive of asthma that she has few symptoms to suggest this and she reports no improvement with bronchodilator therapy in the past.  FENO today was slightly elevatd at 22ppm  Plan: Trial of QVar 2 puffs bid, samples given

## 2017-02-23 NOTE — Assessment & Plan Note (Signed)
She was sent to me for evaluation of dyspnea and on physical exam I cannot find a clear abnormality though she does have a significant reduction of her diffusion capacity on lung function testing. The differential diagnosis of this includes a hemoglobinopathy (which she has) pulmonary hypertension (echocardiogram was suggestive of this) or a pulmonary parenchymal abnormality.  Plan: High-resolution CT scan of the chest to evaluate for a pulmonary parenchymal abnormality If that is normal then consider right heart catheterization Given her history of DVT in the past, would also consider VQ scan at the time of right heart cath if CT scan of the chest is normal

## 2017-02-23 NOTE — Patient Instructions (Addendum)
Try QVar 2 puffs twice a day no matter how you feel We will arrange for a high-resolution CT scan of the chest and then call you with the results We will arrange for an overnight oxygen test and then call you with the results We will see you back in about 2 weeks to go over these results

## 2017-02-23 NOTE — Progress Notes (Signed)
Subjective:    Patient ID: Valerie Jackson, female    DOB: [birth year], 71 y.o.   MRN: [id number]  Synopsis: Referred in June 2018 by the cardiology clinic for evaluation of dyspnea with abnormal pulmonary function testing.  HPI Chief Complaint  Patient presents with  . Advice Only    Referred by Dr. Percival Jackson for SOB, abn PFT.    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, found that when she would try to talk for a prolonged period of time she would have a sensation of dyspnea > she says that she can't walk up a flight of stairs without stopping for a break because this makes her feel short of breath > she can walk on level ground at a slow pace she can keep up for about 30 minutes > she can't exercise due to her dyspnea > she says that she feels dizzy, racing pulse when she is short of breath > she needs to sit down to rest > sometimes she feels like she is going to pass out when she is short of breath > constant > never feels wheezing or chest tightness > occassional associated cough, but this is not a primary complaint > sometimes she will cough when she drinks something  She tried an inhaler in the past, she can't remember the name.  She said that none of them really helped.    She has a history of DVT 49 years ago it was just in her leg, she was on OCP's at the time.  Her sister has Factor V Leidin and she has Sickle Trait but has never had a problem with it.  She was told once when she was diving as a kid she "came up blue".    She says that she has never been told that she had a lung problem in the past.  She had pneumonia twice and says that both times she feels she never made a complete recovery.  The first time was when she was in her early  32's, the second episode was in her 89's, and a third episode occurred in her 28's.  She says the last time she had it she had been exercising and dieting, but after that she could never exercise again because she didn't have the breath for it.    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  No manufactuing job, no consistent environmental exposures.    At age 52 she had a TB exposure and said that she was left with a positive TB test.    She doesn't sleep well at night because she is "bored to death".  She says that her mind runs.  She leaves the TV on in her room, she won't fall asleep until 5AM and then get up around 7:30, she will then fall asleep around 11:30.    Past Medical History:  Diagnosis Date  . Arthritis    fingers  . Blood clot in vein 1968  . Cataracts, bilateral   . Chronic kidney disease    CKD stage 2  . COPD (chronic obstructive  pulmonary disease) ([place])   . Depression   . Diverticulitis 09/2013   with perforation  . Dizziness    with decreased BP readings or bending over  . Floaters   . GERD (gastroesophageal reflux disease)   . H/O hiatal hernia   . Headache(784.0)    migraine every 5- to 6 years  . Heart murmur since age 89   mild  . Hepatitis age 47 or 39   had heapatitis, not sure which kind  . History of blood transfusion 1965  . History of frequent urinary tract infections   . Hypercholesteremia   . Hypertension   . Hypothyroid   . Obesity   . Pericolonic abscess 2015  . Pneumonia   . Sickle cell trait (HCC)      Family History  Problem Relation Age of Onset  . Hypertension Mother   . Aneurysm Mother        Cerebral (died from this)  . Cancer Mother        Uterine  . Cancer Other   . Arthritis Sister   . Hyperlipidemia Sister   . Hypertension Sister   . Stroke Sister   . Kidney disease Sister        Cancer  . Hyperlipidemia Brother     . Glaucoma Brother   . Thyroid cancer Daughter   . Breast cancer Maternal Grandmother   . Hypertension Maternal Grandmother   . Hypercalcemia Neg Hx      Social History   Social History  . Marital status: Divorced    Spouse name: N/A  . Number of children: 1  . Years of education: 24   Occupational History  . Retired     Therapist, sports   Social History Main Topics  . Smoking status: Former Smoker    Packs/day: 0.80    Years: 52.00    Types: Cigarettes    Quit date: 09/06/2012  . Smokeless tobacco: Never Used     Comment: never smoked a full PPD.   [name] Alcohol use No     Comment: very rarely  . Drug use: No  . Sexual activity: Not Currently   Other Topics Concern  . Not on file   Social History Narrative   Regular exercise-no   Caffeine Use-yes     Allergies  Allergen Reactions  . Estrogens     Blood clot in leg     Outpatient Medications Prior to Visit  Medication Sig Dispense Refill  . calcium carbonate (OS-CAL - DOSED IN MG OF ELEMENTAL CALCIUM) 1250 (500 CA) MG tablet Take 1 tablet by mouth daily with breakfast. Reported on 04/03/2016    . cholecalciferol (VITAMIN D) 1000 UNITS tablet Take 1,000 Units by mouth daily.    [name] levothyroxine (SYNTHROID, LEVOTHROID) 112 MCG tablet TAKE 1 TABLET EVERY DAY 90 tablet 1  . SUMAtriptan-naproxen (TREXIMET) 85-500 MG tablet Take 1 tablet by mouth every 2 (two) hours as needed for migraine. 10 tablet 1  . zolpidem (AMBIEN) 5 MG tablet Take 1 tablet (5 mg total) by mouth at bedtime as needed for sleep. 30 tablet 5   No facility-administered medications prior to visit.       Review of Systems  Constitutional: Negative for fever and unexpected weight change.  HENT: Negative for congestion, dental problem, ear pain, nosebleeds, postnasal drip, rhinorrhea, sinus pressure, sneezing, sore throat and trouble swallowing.   Eyes: Negative for redness and itching.  Respiratory: Positive for shortness of breath. Negative for cough, chest  tightness and wheezing.   Cardiovascular: Negative for palpitations and leg swelling.  Gastrointestinal: Negative for nausea and vomiting.  Genitourinary: Negative for dysuria.  Musculoskeletal: Negative for joint swelling.  Skin: Negative for rash.  Neurological: Negative for headaches.  Hematological: Does not bruise/bleed easily.  Psychiatric/Behavioral: Negative for dysphoric mood. The patient is not nervous/anxious.        Objective:   Physical Exam  Vitals:   02/23/17 1423  BP: 124/82  Pulse: 97  SpO2: 99%  Weight: 202 lb 9.6 oz (91.9 kg)  Height: 5' 5"  (1.651 m)   Gen: well appearing, no acute distress HENT: NCAT, OP clear, neck supple without masses Eyes: PERRL, EOMi Lymph: no cervical lymphadenopathy PULM: CTA B CV: RRR, no mgr, no JVD GI: BS+, soft, nontender, no hsm Derm: no rash or skin breakdown MSK: normal bulk and tone Neuro: A&Ox4, CN II-XII intact, strength 5/5 in all 4 extremities Psyche: normal mood and affect   CBC    Component Value Date/Time   WBC 7.8 10/05/2016 1042   RBC 4.20 10/05/2016 1042   HGB 12.7 10/05/2016 1042   HGB 10.7 04/16/2014   HCT 36.7 10/05/2016 1042   HCT 32 04/16/2014   PLT 230.0 10/05/2016 1042   MCV 87.3 10/05/2016 1042   MCV 86.0 04/16/2014   MCH 29.7 03/03/2016 1530   MCHC 34.6 10/05/2016 1042   RDW 13.9 10/05/2016 1042   RDW 14.2 04/16/2014   LYMPHSABS 2.5 10/05/2016 1042   MONOABS 0.3 10/05/2016 1042   EOSABS 0.3 10/05/2016 1042   BASOSABS 0.0 10/05/2016 1042      Chest imaging: 2018 chest x-ray images independently reviewed showing flattening of the diaphragms but no pulmonary parenchymal abnormality 2015 chest CT images independently reviewed showing possible cysts versus mild centrilobular emphysema in the left lung only, otherwise normal pulmonary parenchyma  PFT: May 2018 PFT ratio 69%, FEV1 1.55 L, improved to 1.74 L with resolution of airflow obstruction, 12% change post bronchodilator, change was  only 190 mL however, forced vital capacity 2.35 L 92% predicted, total lung capacity 4.08 L 76% predicted, DLCO 11.81 43% predicted  Echo: April 2018: Mild concentric left ventricular hypertrophy, systolic function normal, LVEF 50-55%, normal wall motion, grade 1 diastolic dysfunction, aortic valve normal, trivial mitral regurgitation, right ventricle size and thickness and systolic function normal, atrial septum normal, trivial tricuspid regurgitation, pulmonary artery systolic pressure estimated 31 mmHg  Exercise stress test: August 2017 exercise stress test: Negative stress test without evidence of ischemia, frequent PVCs noted  Exhaled Nitric Oxide: 02/2017 22 ppm      Assessment & Plan:  Chronic airflow obstruction (HCC) She has a heavy smoking history and airflow obstruction on pulmonary function testing which is reversible. So she does not have COPD, lung function testing was actually suggestive of asthma that she has few symptoms to suggest this and she reports no improvement with bronchodilator therapy in the past.  FENO today was slightly elevatd at 22ppm  Plan: Trial of QVar 2 puffs bid, samples given   Abnormal diffusion capacity determined by pulmonary function test She was sent to me for evaluation of dyspnea and on physical exam I cannot find a clear abnormality though she does have a significant reduction of her diffusion capacity on lung function testing. The differential diagnosis of this includes a hemoglobinopathy (which she has) pulmonary hypertension (echocardiogram was suggestive of this) or a pulmonary parenchymal abnormality.  Plan: High-resolution CT scan of the chest to evaluate for a pulmonary parenchymal abnormality  If that is normal then consider right heart catheterization Given her history of DVT in the past, would also consider VQ scan at the time of right heart cath if CT scan of the chest is normal    Current Outpatient Prescriptions:  .  calcium  carbonate (OS-CAL - DOSED IN MG OF ELEMENTAL CALCIUM) 1250 (500 CA) MG tablet, Take 1 tablet by mouth daily with breakfast. Reported on 04/03/2016, Disp: , Rfl:  .  cholecalciferol (VITAMIN D) 1000 UNITS tablet, Take 1,000 Units by mouth daily., Disp: , Rfl:  .  levothyroxine (SYNTHROID, LEVOTHROID) 112 MCG tablet, TAKE 1 TABLET EVERY DAY, Disp: 90 tablet, Rfl: 1 .  SUMAtriptan-naproxen (TREXIMET) 85-500 MG tablet, Take 1 tablet by mouth every 2 (two) hours as needed for migraine., Disp: 10 tablet, Rfl: 1 .  zolpidem (AMBIEN) 5 MG tablet, Take 1 tablet (5 mg total) by mouth at bedtime as needed for sleep., Disp: 30 tablet, Rfl: 5

## 2017-03-03 ENCOUNTER — Ambulatory Visit (INDEPENDENT_AMBULATORY_CARE_PROVIDER_SITE_OTHER)
Admission: RE | Admit: 2017-03-03 | Discharge: 2017-03-03 | Disposition: A | Discharge status: Home / Self Care | Payer: Medicare Other | Source: Ambulatory Visit | Attending: Pulmonary Disease | Admitting: Pulmonary Disease

## 2017-03-03 DIAGNOSIS — R0600 Dyspnea, unspecified: Secondary | ICD-10-CM | POA: Diagnosis not present

## 2017-03-08 ENCOUNTER — Telehealth: Payer: Self-pay | Admitting: Pulmonary Disease

## 2017-03-08 NOTE — Telephone Encounter (Signed)
Notes recorded by Juanito Doom, MD on 03/04/2017 at 8:43 AM EDT A, Please let the patient know this showed some emphysema but no scarring. Thanks, B --------- lmtcb X1 for pt to review HRCT results.

## 2017-03-09 NOTE — Telephone Encounter (Signed)
lmtcb x2 for pt. 

## 2017-03-09 NOTE — Telephone Encounter (Signed)
Pt's daughter Valerie Jackson) is aware of results and voiced her understanding. Nothing further needed.

## 2017-03-09 NOTE — Telephone Encounter (Signed)
Pt returned phone call, changed contact # to: [phone]..ert

## 2017-03-10 DIAGNOSIS — R0600 Dyspnea, unspecified: Secondary | ICD-10-CM | POA: Diagnosis not present

## 2017-03-22 ENCOUNTER — Ambulatory Visit (INDEPENDENT_AMBULATORY_CARE_PROVIDER_SITE_OTHER): Payer: Medicare Other | Admitting: Adult Health

## 2017-03-22 ENCOUNTER — Encounter: Payer: Self-pay | Admitting: Adult Health

## 2017-03-22 VITALS — BP 130/78 | HR 80 | Ht 65.5 in | Wt 206.6 lb

## 2017-03-22 DIAGNOSIS — R942 Abnormal results of pulmonary function studies: Secondary | ICD-10-CM

## 2017-03-22 DIAGNOSIS — J439 Emphysema, unspecified: Secondary | ICD-10-CM | POA: Diagnosis not present

## 2017-03-22 DIAGNOSIS — R0602 Shortness of breath: Secondary | ICD-10-CM | POA: Diagnosis not present

## 2017-03-22 MED ORDER — BECLOMETHASONE DIPROPIONATE 80 MCG/ACT IN AERS: 2.0000 | INHALATION_SPRAY | Freq: Two times a day (BID) | RESPIRATORY_TRACT | 5 refills | Status: DC

## 2017-03-22 MED ORDER — BECLOMETHASONE DIPROPIONATE 80 MCG/ACT IN AERS: 2.0000 | INHALATION_SPRAY | Freq: Two times a day (BID) | RESPIRATORY_TRACT | 0 refills | Status: DC

## 2017-03-22 NOTE — Progress Notes (Signed)
@Patient  ID: Valerie Jackson, female    DOB: [birth year], 71 y.o.   MRN: [id number]  No chief complaint on file.   Referring provider: Janith Lima, MD  HPI: 71 year old female never smoker seen for pulmonary consult 02/23/2017 for shortness of breath  TEST   Chest imaging: 2018 chest x-ray images independently reviewed showing flattening of the diaphragms but no pulmonary parenchymal abnormality 2015 chest CT images independently reviewed showing possible cysts versus mild centrilobular emphysema in the left lung only, otherwise normal pulmonary parenchyma  PFT: May 2018 PFT ratio 69%, FEV1 1.55 L, improved to 1.74 L with resolution of airflow obstruction, 12% change post bronchodilator, change was only 190 mL however, forced vital capacity 2.35 L 92% predicted, total lung capacity 4.08 L 76% predicted, DLCO 11.81 43% predicted  Echo: April 2018: Mild concentric left ventricular hypertrophy, systolic function normal, LVEF 50-55%, normal wall motion, grade 1 diastolic dysfunction, aortic valve normal, trivial mitral regurgitation, right ventricle size and thickness and systolic function normal, atrial septum normal, trivial tricuspid regurgitation, pulmonary artery systolic pressure estimated 31 mmHg  Exercise stress test: August 2017 exercise stress test: Negative stress test without evidence of ischemia, frequent PVCs noted  Exhaled Nitric Oxide: 02/2017 22 ppm   03/22/2017 Follow up : Asthma and Abnormal PFT  Patient presents for a one-month follow-up. Patient was seen last visit for pulmonary consult  for intermittent shortness of breath over the last 20 years. PFT show some mild airflow obstruction. With reversibility. Her DLCO was decreased. Echo in April 2018 showed an EF of 68-03%, grade 1 diastolic dysfunction, and a pulmonary artery systolic pressure 31 mmHg. She was recommended to begin Qvar 2 puffs twice daily. Patient says she completed her sample does feel like  that it helps somewhat. However, she continues to have shortness of breath. High resolution CT chest 03/03/2017 showed no evidence of interstitial lung disease. Did show some mild emphysema.  Patient has some intermittent dry cough. She denies any fever, chest pain, orthopnea, PND, or increased leg swelling.   Allergies  Allergen Reactions  . Estrogens     Blood clot in leg    Immunization History  Administered Date(s) Administered  . Influenza, High Dose Seasonal PF 07/19/2013  . Pneumococcal Conjugate-13 07/19/2013  . Pneumococcal Polysaccharide-23 09/25/2015  . Td 09/15/2011    Past Medical History:  Diagnosis Date  . Arthritis    fingers  . Blood clot in vein 1968  . Cataracts, bilateral   . Chronic kidney disease    CKD stage 2  . COPD (chronic obstructive pulmonary disease) ([place])   . Depression   . Diverticulitis 09/2013   with perforation  . Dizziness    with decreased BP readings or bending over  . Floaters   . GERD (gastroesophageal reflux disease)   . H/O hiatal hernia   . Headache(784.0)    migraine every 5- to 6 years  . Heart murmur since age 80   mild  . Hepatitis age 58 or 57   had heapatitis, not sure which kind  . History of blood transfusion 1965  . History of frequent urinary tract infections   . Hypercholesteremia   . Hypertension   . Hypothyroid   . Obesity   . Pericolonic abscess 2015  . Pneumonia   . Sickle cell trait (HCC)     Tobacco History: History  Smoking Status  . Former Smoker  . Packs/day: 0.50  . Years: 52.00  . Types: Cigarettes  .  Quit date: 09/06/2012  Smokeless Tobacco  . Never Used    Comment: never smoked a full PPD.    Counseling given: Not Answered   Outpatient Encounter Prescriptions as of 03/22/2017  Medication Sig  . acetaminophen (TYLENOL) 500 MG tablet Take 1,000 mg by mouth every 6 (six) hours as needed for headache.  . calcium carbonate (OS-CAL - DOSED IN MG OF ELEMENTAL CALCIUM) 1250 (500 CA) MG tablet  Take 1 tablet by mouth daily as needed. Reported on 04/03/2016  . cholecalciferol (VITAMIN D) 1000 UNITS tablet Take 1,000 Units by mouth daily.  [name] levothyroxine (SYNTHROID, LEVOTHROID) 112 MCG tablet TAKE 1 TABLET EVERY DAY  . SUMAtriptan-naproxen (TREXIMET) 85-500 MG tablet Take 1 tablet by mouth every 2 (two) hours as needed for migraine.  [name] zolpidem (AMBIEN) 5 MG tablet Take 1 tablet (5 mg total) by mouth at bedtime as needed for sleep.  . beclomethasone (QVAR) 80 MCG/ACT inhaler Inhale 2 puffs into the lungs 2 (two) times daily.  . beclomethasone (QVAR) 80 MCG/ACT inhaler Inhale 2 puffs into the lungs 2 (two) times daily.  . [DISCONTINUED] beclomethasone (QVAR) 80 MCG/ACT inhaler Inhale 2 puffs into the lungs 2 (two) times daily. (Patient not taking: Reported on 03/22/2017)  . [DISCONTINUED] beclomethasone (QVAR) 80 MCG/ACT inhaler Inhale 2 puffs into the lungs 2 (two) times daily.   No facility-administered encounter medications on file as of 03/22/2017.      Review of Systems  Constitutional:   No  weight loss, night sweats,  Fevers, chills, +fatigue, or  lassitude.  HEENT:   No headaches,  Difficulty swallowing,  Tooth/dental problems, or  Sore throat,                No sneezing, itching, ear ache,  +nasal congestion, post nasal drip,   CV:  No chest pain,  Orthopnea, PND, swelling in lower extremities, anasarca, dizziness, palpitations, syncope.   GI  No heartburn, indigestion, abdominal pain, nausea, vomiting, diarrhea, change in bowel habits, loss of appetite, bloody stools.   Resp:    No chest wall deformity  Skin: no rash or lesions.  GU: no dysuria, change in color of urine, no urgency or frequency.  No flank pain, no hematuria   MS:  No joint pain or swelling.  No decreased range of motion.  No back pain.    Physical Exam  BP 130/78 (BP Location: Right Arm, Patient Position: Sitting, Cuff Size: Normal)   Pulse 80   Ht 5' 5.5" (1.664 m)   Wt 206 lb 9.6 oz (93.7 kg)    SpO2 97%   BMI 33.86 kg/m   GEN: A/Ox3; pleasant , NAD, obese    HEENT:  [place]/AT,  EACs-clear, TMs-wnl, NOSE-clear, THROAT-clear, no lesions, no postnasal drip or exudate noted.   NECK:  Supple w/ fair ROM; no JVD; normal carotid impulses w/o bruits; no thyromegaly or nodules palpated; no lymphadenopathy.    RESP  Clear  P & A; w/o, wheezes/ rales/ or rhonchi. no accessory muscle use, no dullness to percussion  CARD:  RRR, no m/r/g, no peripheral edema, pulses intact, no cyanosis or clubbing.  GI:   Soft & nt; nml bowel sounds; no organomegaly or masses detected.   Musco: Warm bil, no deformities or joint swelling noted.   Neuro: alert, no focal deficits noted.    Skin: Warm, no lesions or rashes     Lab Results:  CBC    Imaging: Ct Chest High Resolution  Result Date: 03/03/2017 CLINICAL  DATA:  Dyspnea. Abnormal pulmonary function tests. Former smoker per epic. EXAM: CT CHEST WITHOUT CONTRAST TECHNIQUE: Multidetector CT imaging of the chest was performed following the standard protocol without intravenous contrast. High resolution imaging of the lungs, as well as inspiratory and expiratory imaging, was performed. COMPARISON:  01/21/2017 chest radiograph. FINDINGS: Cardiovascular: Normal heart size. No significant pericardial fluid/thickening. Atherosclerotic nonaneurysmal thoracic aorta. Normal caliber pulmonary arteries. Mediastinum/Nodes: No discrete thyroid nodules. Unremarkable esophagus. No pathologically enlarged axillary, mediastinal or gross hilar lymph nodes, noting limited sensitivity for the detection of hilar adenopathy on this noncontrast study. Lungs/Pleura: No pneumothorax. No pleural effusion. Mild centrilobular emphysema with mild diffuse bronchial wall thickening. Tiny 3 mm solid pulmonary nodules in the dependent lower lobes (series 3/ image 92 on the right and image 96 on the left) are stable since 01/31/2014 chest CT and considered benign. No acute consolidative  airspace disease, lung masses or new significant pulmonary nodules. No significant lobular air trapping on the expiration sequence. No significant regions of subpleural reticulation, ground-glass attenuation, traction bronchiectasis, parenchymal banding, architectural distortion or frank honeycombing. Upper abdomen: Small hiatal hernia. Diffuse hepatic steatosis. Musculoskeletal: No aggressive appearing focal osseous lesions. Marked thoracic spondylosis. IMPRESSION: 1. No convincing findings of interstitial lung disease at this time. No acute pulmonary disease . 2. Mild centrilobular emphysema with mild diffuse bronchial wall thickening, suggesting COPD . 3. Small hiatal hernia . 4. Diffuse hepatic steatosis. Aortic Atherosclerosis (ICD10-I70.0) and Emphysema (ICD10-J43.9). Electronically Signed   By: Ilona Sorrel M.D.   On: 03/03/2017 15:02     Assessment & Plan:   Chronic airflow obstruction (HCC) Mild Asthma -would like her to restart QVAR .   Plan  Patient Instructions  Restart QVAR 2 puffs Twice daily  , rinse after use.  Set up for VQ scan .  Follow up Dr. Lake Bells 2 months and As needed   Please contact office for sooner follow up if symptoms do not improve or worsen or seek emergency care      Abnormal diffusion capacity determined by pulmonary function test Mild airflow obstruction with emphysema on CT chest  No ILD on CT chest  ? Mild Pulmonary HTN . Unclear if this is contributing to her dyspena .  Check VQ scan for possible PE/Chronic VTE  .   Plan  Patient Instructions  Restart QVAR 2 puffs Twice daily  , rinse after use.  Set up for VQ scan .  Follow up Dr. Lake Bells 2 months and As needed   Please contact office for sooner follow up if symptoms do not improve or worsen or seek emergency care         Rexene Edison, NP 03/22/2017

## 2017-03-22 NOTE — Assessment & Plan Note (Signed)
Mild Asthma -would like her to restart QVAR .   Plan  Patient Instructions  Restart QVAR 2 puffs Twice daily  , rinse after use.  Set up for VQ scan .  Follow up Dr. Lake Bells 2 months and As needed   Please contact office for sooner follow up if symptoms do not improve or worsen or seek emergency care

## 2017-03-22 NOTE — Assessment & Plan Note (Signed)
Mild airflow obstruction with emphysema on CT chest  No ILD on CT chest  ? Mild Pulmonary HTN . Unclear if this is contributing to her dyspena .  Check VQ scan for possible PE/Chronic VTE  .   Plan  Patient Instructions  Restart QVAR 2 puffs Twice daily  , rinse after use.  Set up for VQ scan .  Follow up Dr. Lake Bells 2 months and As needed   Please contact office for sooner follow up if symptoms do not improve or worsen or seek emergency care

## 2017-03-22 NOTE — Patient Instructions (Addendum)
Restart QVAR 2 puffs Twice daily  , rinse after use.  Set up for VQ scan .  Follow up Dr. Lake Bells 2 months and As needed   Please contact office for sooner follow up if symptoms do not improve or worsen or seek emergency care

## 2017-03-23 NOTE — Progress Notes (Signed)
Reviewed, agree 

## 2017-03-26 ENCOUNTER — Telehealth: Payer: Self-pay | Admitting: Pulmonary Disease

## 2017-03-26 NOTE — Telephone Encounter (Signed)
A,  Please let her know her ONO was normal  Thanks  B  ------------------------------- lmtcb x1 for pt.

## 2017-03-29 ENCOUNTER — Other Ambulatory Visit: Payer: Self-pay | Admitting: Internal Medicine

## 2017-03-29 ENCOUNTER — Ambulatory Visit (HOSPITAL_COMMUNITY): Payer: Medicare Other

## 2017-03-29 DIAGNOSIS — E890 Postprocedural hypothyroidism: Secondary | ICD-10-CM

## 2017-03-29 NOTE — Telephone Encounter (Signed)
Patient returning call - she can be reached at [phone]

## 2017-03-29 NOTE — Telephone Encounter (Signed)
Spoke with pt. She is aware of ONO results. Nothing further was needed.

## 2017-03-30 ENCOUNTER — Ambulatory Visit (INDEPENDENT_AMBULATORY_CARE_PROVIDER_SITE_OTHER): Payer: Medicare Other | Admitting: Internal Medicine

## 2017-03-30 ENCOUNTER — Other Ambulatory Visit (INDEPENDENT_AMBULATORY_CARE_PROVIDER_SITE_OTHER): Payer: Medicare Other

## 2017-03-30 ENCOUNTER — Encounter: Payer: Self-pay | Admitting: Internal Medicine

## 2017-03-30 ENCOUNTER — Telehealth: Payer: Self-pay | Admitting: Pulmonary Disease

## 2017-03-30 VITALS — BP 144/100 | HR 89 | Temp 98.2°F | Resp 16 | Ht 65.5 in | Wt 207.8 lb

## 2017-03-30 DIAGNOSIS — E038 Other specified hypothyroidism: Secondary | ICD-10-CM

## 2017-03-30 DIAGNOSIS — I10 Essential (primary) hypertension: Secondary | ICD-10-CM

## 2017-03-30 DIAGNOSIS — R10814 Left lower quadrant abdominal tenderness: Secondary | ICD-10-CM | POA: Diagnosis not present

## 2017-03-30 DIAGNOSIS — E669 Obesity, unspecified: Secondary | ICD-10-CM

## 2017-03-30 DIAGNOSIS — N182 Chronic kidney disease, stage 2 (mild): Secondary | ICD-10-CM

## 2017-03-30 LAB — URINALYSIS, ROUTINE W REFLEX MICROSCOPIC
Bilirubin Urine: NEGATIVE
Ketones, ur: NEGATIVE
Nitrite: NEGATIVE
RBC / HPF: NONE SEEN (ref 0–?)
Specific Gravity, Urine: 1.02 (ref 1.000–1.030)
Total Protein, Urine: 100 — AB
Urine Glucose: NEGATIVE
Urobilinogen, UA: 0.2 (ref 0.0–1.0)
pH: 6 (ref 5.0–8.0)

## 2017-03-30 LAB — CBC WITH DIFFERENTIAL/PLATELET
Basophils Absolute: 0.1 10*3/uL (ref 0.0–0.1)
Basophils Relative: 0.9 % (ref 0.0–3.0)
Eosinophils Absolute: 0.3 10*3/uL (ref 0.0–0.7)
Eosinophils Relative: 4.1 % (ref 0.0–5.0)
HCT: 38.5 % (ref 36.0–46.0)
Hemoglobin: 13.3 g/dL (ref 12.0–15.0)
Lymphocytes Relative: 29.4 % (ref 12.0–46.0)
Lymphs Abs: 2.2 10*3/uL (ref 0.7–4.0)
MCHC: 34.5 g/dL (ref 30.0–36.0)
MCV: 88.5 fl (ref 78.0–100.0)
Monocytes Absolute: 0.4 10*3/uL (ref 0.1–1.0)
Monocytes Relative: 5.3 % (ref 3.0–12.0)
Neutro Abs: 4.6 10*3/uL (ref 1.4–7.7)
Neutrophils Relative %: 60.3 % (ref 43.0–77.0)
Platelets: 216 10*3/uL (ref 150.0–400.0)
RBC: 4.35 Mil/uL (ref 3.87–5.11)
RDW: 14 % (ref 11.5–15.5)
WBC: 7.6 10*3/uL (ref 4.0–10.5)

## 2017-03-30 LAB — COMPREHENSIVE METABOLIC PANEL
ALT: 18 U/L (ref 0–35)
AST: 14 U/L (ref 0–37)
Albumin: 4.1 g/dL (ref 3.5–5.2)
Alkaline Phosphatase: 67 U/L (ref 39–117)
BUN: 17 mg/dL (ref 6–23)
CO2: 26 mEq/L (ref 19–32)
Calcium: 10 mg/dL (ref 8.4–10.5)
Chloride: 108 mEq/L (ref 96–112)
Creatinine, Ser: 1.39 mg/dL — ABNORMAL HIGH (ref 0.40–1.20)
GFR: 47.92 mL/min — ABNORMAL LOW (ref >60.00)
Glucose, Bld: 132 mg/dL — ABNORMAL HIGH (ref 70–99)
Potassium: 3.5 mEq/L (ref 3.5–5.1)
Sodium: 142 mEq/L (ref 135–145)
Total Bilirubin: 0.7 mg/dL (ref 0.2–1.2)
Total Protein: 7.1 g/dL (ref 6.0–8.3)

## 2017-03-30 LAB — TSH: TSH: 1.11 u[IU]/mL (ref 0.35–4.50)

## 2017-03-30 NOTE — Patient Instructions (Signed)

## 2017-03-30 NOTE — Telephone Encounter (Signed)
atc pt X3, line rang to fast busy signal.  Wcb.  

## 2017-03-30 NOTE — Progress Notes (Signed)
Subjective:  Patient ID: Valerie Jackson, female    DOB: 03-Mar-1946  Age: 71 y.o. MRN: [id number]  CC: Abdominal Pain and Hypothyroidism   HPI Valerie Jackson presents for f/up - She complains of a one year history of intermittent left lower quadrant pain that she describes as a stabbing sensation. She feels like she has a hernia at the site of her prior surgery. She said the area feels better when she presses on it. She has about 4-5 normal bowel movements a day. She also complains of weight gain. She denies any recent episodes of nausea, vomiting, loss of appetite, weight loss, dysuria, or hematuria.  Outpatient Medications Prior to Visit  Medication Sig Dispense Refill  . acetaminophen (TYLENOL) 500 MG tablet Take 1,000 mg by mouth every 6 (six) hours as needed for headache.    . beclomethasone (QVAR) 80 MCG/ACT inhaler Inhale 2 puffs into the lungs 2 (two) times daily. 1 Inhaler 0  . calcium carbonate (OS-CAL - DOSED IN MG OF ELEMENTAL CALCIUM) 1250 (500 CA) MG tablet Take 1 tablet by mouth daily as needed. Reported on 04/03/2016    . cholecalciferol (VITAMIN D) 1000 UNITS tablet Take 1,000 Units by mouth daily.    [name] levothyroxine (SYNTHROID, LEVOTHROID) 112 MCG tablet TAKE 1 TABLET EVERY DAY 90 tablet 0  . SUMAtriptan-naproxen (TREXIMET) 85-500 MG tablet Take 1 tablet by mouth every 2 (two) hours as needed for migraine. 10 tablet 1  . zolpidem (AMBIEN) 5 MG tablet Take 1 tablet (5 mg total) by mouth at bedtime as needed for sleep. 30 tablet 5  . beclomethasone (QVAR) 80 MCG/ACT inhaler Inhale 2 puffs into the lungs 2 (two) times daily. 1 Inhaler 5   No facility-administered medications prior to visit.     ROS Review of Systems  Constitutional: Positive for unexpected weight change. Negative for appetite change, chills, diaphoresis and fatigue.  HENT: Negative.   Eyes: Negative for visual disturbance.  Respiratory: Negative for cough, chest tightness, shortness of breath, wheezing  and stridor.   Cardiovascular: Negative for chest pain, palpitations and leg swelling.  Gastrointestinal: Positive for abdominal pain. Negative for blood in stool, constipation, diarrhea, nausea and vomiting.  Endocrine: Negative.  Negative for cold intolerance and heat intolerance.  Genitourinary: Negative.  Negative for decreased urine volume, difficulty urinating, dysuria, flank pain, frequency, hematuria, pelvic pain, urgency and vaginal bleeding.  Musculoskeletal: Negative.  Negative for back pain and myalgias.  Skin: Negative.  Negative for rash.  Neurological: Negative.  Negative for dizziness, weakness, light-headedness, numbness and headaches.  Hematological: Negative.   Psychiatric/Behavioral: Negative.     Objective:  BP (!) 144/100 (BP Location: Left Arm, Patient Position: Sitting, Cuff Size: Normal)   Pulse 89   Temp 98.2 F (36.8 C) (Oral)   Ht 5' 5.5" (1.664 m)   Wt 207 lb 12 oz (94.2 kg)   SpO2 98%   BMI 34.05 kg/m   BP Readings from Last 3 Encounters:  03/30/17 (!) 144/100  03/22/17 130/78  02/23/17 124/82    Wt Readings from Last 3 Encounters:  03/30/17 207 lb 12 oz (94.2 kg)  03/22/17 206 lb 9.6 oz (93.7 kg)  02/23/17 202 lb 9.6 oz (91.9 kg)    Physical Exam  Constitutional: She is oriented to person, place, and time. No distress.  HENT:  Mouth/Throat: Oropharynx is clear and moist. No oropharyngeal exudate.  Eyes: Conjunctivae are normal. Right eye exhibits no discharge. Left eye exhibits no discharge. No scleral icterus.  Neck: Normal range of motion. Neck supple. No JVD present. No thyromegaly present.  Cardiovascular: Normal rate, regular rhythm and intact distal pulses.  Exam reveals no gallop and no friction rub.   No murmur heard. Pulmonary/Chest: Effort normal and breath sounds normal. No respiratory distress. She has no wheezes. She has no rales. She exhibits no tenderness.  Abdominal: Soft. Bowel sounds are normal. She exhibits no distension  and no mass. There is no hepatosplenomegaly, splenomegaly or hepatomegaly. There is tenderness in the left lower quadrant. There is no rigidity, no rebound, no guarding, no CVA tenderness, no tenderness at McBurney's point and negative Murphy's sign. A hernia is present. Hernia confirmed positive in the ventral area. Hernia confirmed negative in the right inguinal area and confirmed negative in the left inguinal area.  Small hernia noted around the lateral aspect of the scar over his LLQ  Musculoskeletal: Normal range of motion. She exhibits no edema or tenderness.  Lymphadenopathy:    She has no cervical adenopathy.  Neurological: She is alert and oriented to person, place, and time.  Skin: Skin is warm and dry. No rash noted. She is not diaphoretic. No erythema. No pallor.  Vitals reviewed.   Lab Results  Component Value Date   WBC 7.6 03/30/2017   HGB 13.3 03/30/2017   HCT 38.5 03/30/2017   PLT 216.0 03/30/2017   GLUCOSE 132 (H) 03/30/2017   CHOL 229 (H) 10/05/2016   TRIG 166.0 (H) 10/05/2016   HDL 50.60 10/05/2016   LDLDIRECT 226.0 02/27/2013   LDLCALC 145 (H) 10/05/2016   ALT 18 03/30/2017   AST 14 03/30/2017   NA 142 03/30/2017   K 3.5 03/30/2017   CL 108 03/30/2017   CREATININE 1.39 (H) 03/30/2017   BUN 17 03/30/2017   CO2 26 03/30/2017   TSH 1.11 03/30/2017   INR 1.10 03/03/2016   HGBA1C 4.7 05/14/2014    Ct Chest High Resolution  Result Date: 03/03/2017 CLINICAL DATA:  Dyspnea. Abnormal pulmonary function tests. Former smoker per epic. EXAM: CT CHEST WITHOUT CONTRAST TECHNIQUE: Multidetector CT imaging of the chest was performed following the standard protocol without intravenous contrast. High resolution imaging of the lungs, as well as inspiratory and expiratory imaging, was performed. COMPARISON:  01/21/2017 chest radiograph. FINDINGS: Cardiovascular: Normal heart size. No significant pericardial fluid/thickening. Atherosclerotic nonaneurysmal thoracic aorta. Normal  caliber pulmonary arteries. Mediastinum/Nodes: No discrete thyroid nodules. Unremarkable esophagus. No pathologically enlarged axillary, mediastinal or gross hilar lymph nodes, noting limited sensitivity for the detection of hilar adenopathy on this noncontrast study. Lungs/Pleura: No pneumothorax. No pleural effusion. Mild centrilobular emphysema with mild diffuse bronchial wall thickening. Tiny 3 mm solid pulmonary nodules in the dependent lower lobes (series 3/ image 92 on the right and image 96 on the left) are stable since 01/31/2014 chest CT and considered benign. No acute consolidative airspace disease, lung masses or new significant pulmonary nodules. No significant lobular air trapping on the expiration sequence. No significant regions of subpleural reticulation, ground-glass attenuation, traction bronchiectasis, parenchymal banding, architectural distortion or frank honeycombing. Upper abdomen: Small hiatal hernia. Diffuse hepatic steatosis. Musculoskeletal: No aggressive appearing focal osseous lesions. Marked thoracic spondylosis. IMPRESSION: 1. No convincing findings of interstitial lung disease at this time. No acute pulmonary disease . 2. Mild centrilobular emphysema with mild diffuse bronchial wall thickening, suggesting COPD . 3. Small hiatal hernia . 4. Diffuse hepatic steatosis. Aortic Atherosclerosis (ICD10-I70.0) and Emphysema (ICD10-J43.9). Electronically Signed   By: Ilona Sorrel M.D.   On: 03/03/2017 15:02  Assessment & Plan:   [name] was seen today for abdominal pain and hypothyroidism.  Diagnoses and all orders for this visit:  Essential hypertension- she is not willing to treat this -     Comprehensive metabolic panel; Future  CKD (chronic kidney disease) stage 2, GFR 60-89 ml/min- her renal function is stable, she is not willing to treat the high blood pressure. She agrees to avoid nephrotoxic agents. -     Comprehensive metabolic panel; Future -     CBC with  Differential/Platelet; Future  Other specified hypothyroidism- her TSH is in the normal range. She'll remain on current dose of levothyroxine. -     TSH; Future  Obesity (BMI 30-39.9)- she agrees to work on her lifestyle modifications to lose weight.  Left lower quadrant abdominal tenderness without rebound tenderness- her UA shows mild hematuria. The rest of her labs are not remarkable for any organic pathology. I've ordered a CT with contrast to see if there is any renal pathology and to identify causes for her left lower quadrant abdominal pain. I am concerned she may have an incisional hernia - the CT scan will let me know if there are any complications such as incarceration or strangulation. -     Comprehensive metabolic panel; Future -     CBC with Differential/Platelet; Future -     Urinalysis, Routine w reflex microscopic; Future -     CT ABDOMEN PELVIS W CONTRAST; Future   I am having Ms. Joswick maintain her zolpidem, cholecalciferol, calcium carbonate, SUMAtriptan-naproxen, acetaminophen, beclomethasone, and levothyroxine.  No orders of the defined types were placed in this encounter.    Follow-up: Return in about 3 weeks (around 04/20/2017).  [name], MD

## 2017-03-31 ENCOUNTER — Encounter: Payer: Self-pay | Admitting: Internal Medicine

## 2017-03-31 NOTE — Telephone Encounter (Signed)
Spoke with pt. States that Qvar is to expensive. Advised her that she would need to contact her insurance and see what other medications are on her formulary. Will await her call back.

## 2017-04-01 ENCOUNTER — Ambulatory Visit (INDEPENDENT_AMBULATORY_CARE_PROVIDER_SITE_OTHER)
Admission: RE | Admit: 2017-04-01 | Discharge: 2017-04-01 | Disposition: A | Discharge status: Home / Self Care | Payer: Medicare Other | Source: Ambulatory Visit | Attending: Internal Medicine | Admitting: Internal Medicine

## 2017-04-01 DIAGNOSIS — N200 Calculus of kidney: Secondary | ICD-10-CM | POA: Diagnosis not present

## 2017-04-01 DIAGNOSIS — R10814 Left lower quadrant abdominal tenderness: Secondary | ICD-10-CM

## 2017-04-01 MED ORDER — IOPAMIDOL (ISOVUE-300) INJECTION 61%
100.0000 mL | Freq: Once | INTRAVENOUS | Status: AC | PRN
  Administered 2017-04-01: 80 mL via INTRAVENOUS

## 2017-04-03 ENCOUNTER — Encounter: Payer: Self-pay | Admitting: Internal Medicine

## 2017-04-05 ENCOUNTER — Ambulatory Visit (HOSPITAL_COMMUNITY)
Admission: RE | Admit: 2017-04-05 | Discharge: 2017-04-05 | Disposition: A | Discharge status: Home / Self Care | Payer: Medicare Other | Source: Ambulatory Visit | Attending: Adult Health | Admitting: Adult Health

## 2017-04-05 DIAGNOSIS — R0602 Shortness of breath: Secondary | ICD-10-CM | POA: Insufficient documentation

## 2017-04-05 MED ORDER — TECHNETIUM TO 99M ALBUMIN AGGREGATED
5.4000 | Freq: Once | INTRAVENOUS | Status: AC | PRN
  Administered 2017-04-05: 5.4 via INTRAVENOUS

## 2017-04-05 MED ORDER — TECHNETIUM TC 99M DIETHYLENETRIAME-PENTAACETIC ACID
30.5000 | Freq: Once | INTRAVENOUS | Status: AC | PRN
  Administered 2017-04-05: 30.5 via RESPIRATORY_TRACT

## 2017-04-07 ENCOUNTER — Ambulatory Visit (INDEPENDENT_AMBULATORY_CARE_PROVIDER_SITE_OTHER): Payer: Medicare Other | Admitting: Endocrinology

## 2017-04-07 ENCOUNTER — Encounter: Payer: Self-pay | Admitting: Endocrinology

## 2017-04-07 DIAGNOSIS — E213 Hyperparathyroidism, unspecified: Secondary | ICD-10-CM | POA: Diagnosis not present

## 2017-04-07 LAB — VITAMIN D 25 HYDROXY (VIT D DEFICIENCY, FRACTURES): VITD: 40.68 ng/mL (ref 30.00–100.00)

## 2017-04-07 NOTE — Patient Instructions (Addendum)
blood tests are requested for you today.  We'll let you know about the results. Please continue to work with Dr Ronnald Ramp on the blood pressure.   Please come back for a follow-up appointment in 1 year.

## 2017-04-07 NOTE — Progress Notes (Signed)
Subjective:    Patient ID: Valerie Jackson, female    DOB: 1945-10-26, 71 y.o.   MRN: [id number]  HPI The state of at least three ongoing medical problems is addressed today, with interval history of each noted here:  Pt returns for f/u of hypercalcemia (dx'ed early 2017 (it was normal in 2016); chlorthalidone was continued, as hypercalcemia is mild; no other cause was found).  She again reports doe.  This is a stable problem. Mild hyperparathyroidism (renal insufficiency could contribute; she did not meet criteria for surgery; she has never had urolithisis).  Denies hematuria.  Osteoporosis: (dx'ed 2016: she has never been on rx for this; FRAX in 2016: 10 year major osteoporotic risk: 4.7%. 10 year hip fracture risk: 0.8%. These are under the thresholds for treatment of 20% and 3%, respectively; she has never been on rx for this; she has never had bony fracture).  Denies falls.  This is a stable problem. HTN: denies edema.   She takes chlorthalidone only PRN.  This is a stable problem. Vit-D deficiency: she takes OTC supplement, but does not recall dosage.  She believes it to be < 1000 units/day.  she denies leg cramps.  This is a stable problem. Past Medical History:  Diagnosis Date  . Arthritis    fingers  . Blood clot in vein 1968  . Cataracts, bilateral   . Chronic kidney disease    CKD stage 2  . COPD (chronic obstructive pulmonary disease) ([place])   . Depression   . Diverticulitis 09/2013   with perforation  . Dizziness    with decreased BP readings or bending over  . Floaters   . GERD (gastroesophageal reflux disease)   . H/O hiatal hernia   . Headache(784.0)    migraine every 5- to 6 years  . Heart murmur since age 41   mild  . Hepatitis age 66 or 20   had heapatitis, not sure which kind  . History of blood transfusion 1965  . History of frequent urinary tract infections   . Hypercholesteremia   . Hypertension   . Hypothyroid   . Obesity   . Pericolonic abscess 2015  .  Pneumonia   . Sickle cell trait [facility])     Past Surgical History:  Procedure Laterality Date  . CHOLECYSTECTOMY  2002  . COLON RESECTION N/A 10/11/2013   Procedure: EXPLORATORY LAPAROTOMY WITH RECTOSIGMOID COLECTOMY AND END COLOSTOMY;  Surgeon: Stark Klein, MD;  Location: [facility];  Service: General;  Laterality: N/A;  . COLOSTOMY TAKEDOWN N/A 02/27/2014   Procedure: COLOSTOMY TAKEDOWN [name] HERNIA REPAIR;  Surgeon: Stark Klein, MD;  Location: [facility];  Service: General;  Laterality: N/A;  . INCISIONAL HERNIA REPAIR N/A 03/04/2016   Procedure: LAPAROSCOPIC INCISIONAL HERNIA WITH MESH;  Surgeon: Ralene Ok, MD;  Location: [facility];  Service: General;  Laterality: N/A;  . LAPAROSCOPIC LYSIS OF ADHESIONS N/A 03/04/2016   Procedure: LAPAROSCOPIC LYSIS OF ADHESIONS;  Surgeon: Ralene Ok, MD;  Location: [facility];  Service: General;  Laterality: N/A;  . TONSILLECTOMY  1952    Social History   Social History  . Marital status: Divorced    Spouse name: N/A  . Number of children: 1  . Years of education: 66   Occupational History  . Retired     Therapist, sports   Social History Main Topics  . Smoking status: Former Smoker    Packs/day: 0.50    Years: 52.00    Types: Cigarettes    Quit  date: 09/06/2012  . Smokeless tobacco: Never Used     Comment: never smoked a full PPD.   [name] Alcohol use No     Comment: very rarely  . Drug use: No  . Sexual activity: Not Currently   Other Topics Concern  . Not on file   Social History Narrative   Regular exercise-no   Caffeine Use-yes    Current Outpatient Prescriptions on File Prior to Visit  Medication Sig Dispense Refill  . acetaminophen (TYLENOL) 500 MG tablet Take 1,000 mg by mouth every 6 (six) hours as needed for headache.    . beclomethasone (QVAR) 80 MCG/ACT inhaler Inhale 2 puffs into the lungs 2 (two) times daily. 1 Inhaler 0  . calcium carbonate (OS-CAL - DOSED IN MG OF ELEMENTAL CALCIUM) 1250 (500 CA) MG tablet Take 1 tablet by mouth  daily as needed. Reported on 04/03/2016    . cholecalciferol (VITAMIN D) 1000 UNITS tablet Take 1,000 Units by mouth daily.    [name] levothyroxine (SYNTHROID, LEVOTHROID) 112 MCG tablet TAKE 1 TABLET EVERY DAY 90 tablet 0  . SUMAtriptan-naproxen (TREXIMET) 85-500 MG tablet Take 1 tablet by mouth every 2 (two) hours as needed for migraine. 10 tablet 1  . zolpidem (AMBIEN) 5 MG tablet Take 1 tablet (5 mg total) by mouth at bedtime as needed for sleep. 30 tablet 5   No current facility-administered medications on file prior to visit.     Allergies  Allergen Reactions  . Estrogens     Blood clot in leg    Family History  Problem Relation Age of Onset  . Hypertension Mother   . Aneurysm Mother        Cerebral (died from this)  . Cancer Mother        Uterine  . Cancer Other   . Arthritis Sister   . Hyperlipidemia Sister   . Hypertension Sister   . Stroke Sister   . Kidney disease Sister        Cancer  . Hyperlipidemia Brother   . Glaucoma Brother   . Thyroid cancer Daughter   . Breast cancer Maternal Grandmother   . Hypertension Maternal Grandmother   . Hypercalcemia Neg Hx    BP 132/82   Pulse (!) 105   Wt 208 lb (94.3 kg)   SpO2 98%   BMI 34.09 kg/m   Review of Systems She reports excessive appetite. No weight change    Objective:   Physical Exam VITAL SIGNS:  See vs page GENERAL: no distress Gait: normal and steady.  Ext: no edema.   Lab Results  Component Value Date   CREATININE 1.39 (H) 03/30/2017   BUN 17 03/30/2017   NA 142 03/30/2017   K 3.5 03/30/2017   CL 108 03/30/2017   CO2 26 03/30/2017   Lab Results  Component Value Date   PTH 50 04/07/2017   CALCIUM 9.6 04/07/2017   PHOS 3.4 02/25/2016   25-OH vit-D=normal    Assessment & Plan:  Hyperparathyroidism, better on vit-D supplementation.  Please continue the same medication Renal insufficiency: this could also cause hyperparathyroidism.  We'll follow Hypercalcemia: normal now: she can continue  chlorthalidone.   Vit-D deficiency: well-replaced. Please continue the same medication.

## 2017-04-07 NOTE — Telephone Encounter (Signed)
LMTCB

## 2017-04-08 LAB — PTH, INTACT AND CALCIUM
Calcium: 9.6 mg/dL (ref 8.6–10.4)
PTH: 50 pg/mL (ref 14–64)

## 2017-04-08 NOTE — Telephone Encounter (Signed)
Called spoke with patient who reported that she has not been able to reach out to her insurance yet Offered to help patient with this process but she declined Advised pt to call us or e-mail (she is active in [place]) once she knows the covered alternatives Pt voiced her understanding Nothing further needed at this time; will sign off

## 2017-04-12 NOTE — Progress Notes (Signed)
Called spoke with patient, advised of VQ scan results / recs as stated by TP.  Pt verbalized her understanding and denied any questions.

## 2017-04-12 NOTE — Progress Notes (Signed)
Called spoke with patient, advised of cxr results / recs as stated by TP.  Pt verbalized her understanding and denied any questions. 

## 2017-04-14 ENCOUNTER — Telehealth: Payer: Self-pay | Admitting: Pulmonary Disease

## 2017-04-14 MED ORDER — FLUTICASONE FUROATE 100 MCG/ACT IN AEPB: 1.0000 | INHALATION_SPRAY | Freq: Every day | RESPIRATORY_TRACT | 3 refills | Status: DC

## 2017-04-14 NOTE — Telephone Encounter (Signed)
Spoke with patient. She was calling us back in regards to the phone note on 03/30/17 regarding the Qvar being too expensive. [employer] told her that she could receive Flovent for 3 months for $116 or Arnuity for 3 months at $116. She wishes to use [facility] mail delivery pharmacy.   BQ, please advise if you are willing to switch the patient to Flovent or Arnuity? Thanks!

## 2017-04-14 NOTE — Telephone Encounter (Signed)
Left message for patient to inform her of BQ's recs.

## 2017-04-14 NOTE — Telephone Encounter (Signed)
Spoke with the pt and notified of recs per BQ  She verbalized understanding  Rx was sent to pharm  Nothing further needed

## 2017-04-14 NOTE — Telephone Encounter (Signed)
Patient called about scan results. She was informed of the notes &understood. She states no referral needed, she did not think much was going on any ways. She would just like to wait and see how she is feeling first. She will follow up with any change.

## 2017-04-14 NOTE — Telephone Encounter (Signed)
Arnuity 120mg daily

## 2017-06-08 ENCOUNTER — Other Ambulatory Visit: Payer: Self-pay | Admitting: Internal Medicine

## 2017-06-08 DIAGNOSIS — E890 Postprocedural hypothyroidism: Secondary | ICD-10-CM

## 2017-06-09 ENCOUNTER — Ambulatory Visit (INDEPENDENT_AMBULATORY_CARE_PROVIDER_SITE_OTHER): Payer: Medicare Other | Admitting: Pulmonary Disease

## 2017-06-09 ENCOUNTER — Encounter: Payer: Self-pay | Admitting: Pulmonary Disease

## 2017-06-09 VITALS — BP 112/68 | HR 96 | Ht 65.5 in | Wt 202.4 lb

## 2017-06-09 DIAGNOSIS — R0600 Dyspnea, unspecified: Secondary | ICD-10-CM

## 2017-06-09 DIAGNOSIS — R5383 Other fatigue: Secondary | ICD-10-CM | POA: Diagnosis not present

## 2017-06-09 NOTE — Patient Instructions (Signed)
Centrilobular emphysema and asthma: Continue our new 80 as you're doing Albuterol as needed for chest tightness wheezing or shortness of breath Please reconsider a flu shot  Abnormal lung function test with shortness of breath: I am concerned about the possibility of pulmonary hypertension I am going to refer you back to cardiology for a right heart catheterization to evaluate this further  For fatigue: I am concerned about the possibility of sleep apnea, we will order a sleep study  We will see you back in 6-8 weeks or sooner if needed

## 2017-06-09 NOTE — Progress Notes (Signed)
Subjective:    Patient ID: Valerie Jackson, female    DOB: 1945-10-11, 71 y.o.   MRN: [id number]  Synopsis: Referred in June 2018 by the cardiology clinic for evaluation of dyspnea with abnormal pulmonary function testing.She has mild centrilobular emphysema and asthma but not COPD.  She smoked for 40 years and quit smoking in 2013.   HPI Chief Complaint  Patient presents with  . Follow-up    Pt still becoming SOB on exertion but states it is a little better since last visit. Denies any cough or CP.   Valerie Jackson says that her breathing is a little better than when I saw her the last time.  She can go up stairs once without dyspnea but not twice.  She hasn't walked much.  Yelling will make her more short of breath.  She says that the [place] has helped "a little bit".  She has intermittent chest tightness.  She denies cough unless she chokes on food.  No wheezing that she knows of. No bronchitis or pneumonia.   She has had pre-syncope spells and says she avoids hot showers because they make her feel light headed. She can't exercise much because she feels light headed when she does this.  Some of her lightheadedness has to do with fast changes in position, but this is rare.    She doesn't sleep well, has a hard time falling asleep.  She has daytime sleepiness and sleeps throughout the day.  She has been told that she snores.   Past Medical History:  Diagnosis Date  . Arthritis    fingers  . Blood clot in vein 1968  . Cataracts, bilateral   . Chronic kidney disease    CKD stage 2  . COPD (chronic obstructive pulmonary disease) ([place])   . Depression   . Diverticulitis 09/2013   with perforation  . Dizziness    with decreased BP readings or bending over  . Floaters   . GERD (gastroesophageal reflux disease)   . H/O hiatal hernia   . Headache(784.0)    migraine every 5- to 6 years  . Heart murmur since age 25   mild  . Hepatitis age 82 or 63   had heapatitis, not sure which kind  .  History of blood transfusion 1965  . History of frequent urinary tract infections   . Hypercholesteremia   . Hypertension   . Hypothyroid   . Obesity   . Pericolonic abscess 2015  . Pneumonia   . Sickle cell trait ([place])           Review of Systems  Constitutional: Negative for fever and unexpected weight change.  HENT: Negative for congestion, dental problem, ear pain, nosebleeds, postnasal drip, rhinorrhea, sinus pressure, sneezing, sore throat and trouble swallowing.   Eyes: Negative for redness and itching.  Respiratory: Positive for shortness of breath. Negative for cough, chest tightness and wheezing.   Cardiovascular: Negative for palpitations and leg swelling.  Gastrointestinal: Negative for nausea and vomiting.  Genitourinary: Negative for dysuria.  Musculoskeletal: Negative for joint swelling.  Skin: Negative for rash.  Neurological: Negative for headaches.  Hematological: Does not bruise/bleed easily.  Psychiatric/Behavioral: Negative for dysphoric mood. The patient is not nervous/anxious.        Objective:   Physical Exam  Vitals:   06/09/17 1120  BP: 112/68  Pulse: 96  SpO2: 94%  Weight: 202 lb 6.4 oz (91.8 kg)  Height: 5' 5.5" (1.664 m)    Gen: obese,  but well appearing HENT: OP clear, TM's clear, neck supple PULM: CTA B, normal percussion CV: RRR, no mgr, trace edema GI: BS+, soft, nontender Derm: no cyanosis or rash Psyche: normal mood and affect    CBC    Component Value Date/Time   WBC 7.6 03/30/2017 0949   RBC 4.35 03/30/2017 0949   HGB 13.3 03/30/2017 0949   HGB 10.7 04/16/2014   HCT 38.5 03/30/2017 0949   HCT 32 04/16/2014   PLT 216.0 03/30/2017 0949   MCV 88.5 03/30/2017 0949   MCV 86.0 04/16/2014   MCH 29.7 03/03/2016 1530   MCHC 34.5 03/30/2017 0949   RDW 14.0 03/30/2017 0949   RDW 14.2 04/16/2014   LYMPHSABS 2.2 03/30/2017 0949   MONOABS 0.4 03/30/2017 0949   EOSABS 0.3 03/30/2017 0949   BASOSABS 0.1 03/30/2017 0949       Chest imaging: 2018 chest x-ray images independently reviewed showing flattening of the diaphragms but no pulmonary parenchymal abnormality 2015 chest CT images independently reviewed showing possible cysts versus mild centrilobular emphysema in the left lung only, otherwise normal pulmonary parenchyma June 2018 high-resolution CT chest images independently reviewed showing mild centrilobular emphysema and an upper lobe distribution no interstitial lung disease findings July 2018 VQ scan negative for pulmonary bolus him  PFT: May 2018 PFT ratio 69%, FEV1 1.55 L, improved to 1.74 L with resolution of airflow obstruction, 12% change post bronchodilator, change was only 190 mL however, forced vital capacity 2.35 L 92% predicted, total lung capacity 4.08 L 76% predicted, DLCO 11.81 43% predicted  Echo: April 2018: Mild concentric left ventricular hypertrophy, systolic function normal, LVEF 50-55%, normal wall motion, grade 1 diastolic dysfunction, aortic valve normal, trivial mitral regurgitation, right ventricle size and thickness and systolic function normal, atrial septum normal, trivial tricuspid regurgitation, pulmonary artery systolic pressure estimated 31 mmHg  Exercise stress test: August 2017 exercise stress test: Negative stress test without evidence of ischemia, frequent PVCs noted  Exhaled Nitric Oxide: 02/2017 22 ppm      Assessment & Plan:   No diagnosis found.  Abnormal lung function test, decreased diffusion capacity Moderate persistent asthma Centrilobular emphysema Prior tobacco use Snoring, daytime somnolence   Discussion: Valerie Jackson continues to complain of shortness of breath despite initiation of inhaled steroids for her asthma-like syndrome. Further, she does not have significant asthma symptoms in that she has minimal cough and no wheeze. So given her persistent shortness of breath with an abnormal diffusion capacity seen on pulmonary function testing and no  evidence of interstitial lung disease I think it's best rest to assess for pulmonary hypertension. I am worried about the possibility of sleep apnea as a potential cause. She does have mild centrilobular emphysema but I don't think it's enough to explain the extent of her dyspnea.  Plan: Centrilobular emphysema and asthma: Continue our new 57 as you're doing Albuterol as needed for chest tightness wheezing or shortness of breath Please reconsider a flu shot  Abnormal lung function test with shortness of breath: I am concerned about the possibility of pulmonary hypertension I am going to refer you back to cardiology for a right heart catheterization to evaluate this further  For fatigue: I am concerned about the possibility of sleep apnea, we will order a sleep study  We will see you back in 6-8 weeks or sooner if needed    Current Outpatient Prescriptions:  .  acetaminophen (TYLENOL) 500 MG tablet, Take 1,000 mg by mouth every 6 (six) hours as needed for  headache., Disp: , Rfl:  .  calcium carbonate (OS-CAL - DOSED IN MG OF ELEMENTAL CALCIUM) 1250 (500 CA) MG tablet, Take 1 tablet by mouth daily as needed. Reported on 04/03/2016, Disp: , Rfl:  .  cholecalciferol (VITAMIN D) 1000 UNITS tablet, Take 1,000 Units by mouth daily., Disp: , Rfl:  .  Fluticasone Furoate (ARNUITY ELLIPTA) 100 MCG/ACT AEPB, Inhale 1 puff into the lungs daily., Disp: 90 each, Rfl: 3 .  levothyroxine (SYNTHROID, LEVOTHROID) 112 MCG tablet, TAKE 1 TABLET EVERY DAY, Disp: 90 tablet, Rfl: 0 .  SUMAtriptan-naproxen (TREXIMET) 85-500 MG tablet, Take 1 tablet by mouth every 2 (two) hours as needed for migraine., Disp: 10 tablet, Rfl: 1 .  zolpidem (AMBIEN) 5 MG tablet, Take 1 tablet (5 mg total) by mouth at bedtime as needed for sleep. (Patient not taking: Reported on 06/09/2017), Disp: 30 tablet, Rfl: 5

## 2017-06-11 ENCOUNTER — Telehealth (HOSPITAL_COMMUNITY): Payer: Self-pay | Admitting: *Deleted

## 2017-06-11 ENCOUNTER — Other Ambulatory Visit (HOSPITAL_COMMUNITY): Payer: Self-pay | Admitting: *Deleted

## 2017-06-11 DIAGNOSIS — R0600 Dyspnea, unspecified: Secondary | ICD-10-CM

## 2017-06-11 NOTE — Telephone Encounter (Signed)
Per Dr Haroldine Laws pt needs RHC next week per Dr Lake Bells for dyspnea, eval for pulm htn.  Pt sch for 10/4 at 10:30, pt aware and all instructions reviewed with her via phone

## 2017-06-15 ENCOUNTER — Telehealth (HOSPITAL_COMMUNITY): Payer: Self-pay | Admitting: *Deleted

## 2017-06-15 NOTE — Telephone Encounter (Signed)
Per Dr Haroldine Laws, pt's [place] from 10/4 to 10/11 at 10:30 pt aware

## 2017-06-21 ENCOUNTER — Ambulatory Visit (HOSPITAL_BASED_OUTPATIENT_CLINIC_OR_DEPARTMENT_OTHER): Payer: Medicare Other | Attending: Pulmonary Disease | Admitting: Cardiology

## 2017-06-21 VITALS — Ht 66.0 in | Wt 200.0 lb

## 2017-06-21 DIAGNOSIS — R5383 Other fatigue: Secondary | ICD-10-CM | POA: Diagnosis not present

## 2017-06-21 DIAGNOSIS — I493 Ventricular premature depolarization: Secondary | ICD-10-CM | POA: Insufficient documentation

## 2017-06-24 ENCOUNTER — Encounter (HOSPITAL_COMMUNITY)
Admission: RE | Disposition: A | Discharge status: Home / Self Care | Payer: Self-pay | Source: Ambulatory Visit | Attending: Internal Medicine

## 2017-06-24 ENCOUNTER — Ambulatory Visit (HOSPITAL_COMMUNITY)
Admission: RE | Admit: 2017-06-24 | Discharge: 2017-06-24 | Disposition: A | Discharge status: Home / Self Care | Payer: Medicare Other | Source: Ambulatory Visit | Attending: Internal Medicine | Admitting: Internal Medicine

## 2017-06-24 ENCOUNTER — Encounter (HOSPITAL_COMMUNITY): Payer: Self-pay | Admitting: Internal Medicine

## 2017-06-24 DIAGNOSIS — E039 Hypothyroidism, unspecified: Secondary | ICD-10-CM | POA: Diagnosis not present

## 2017-06-24 DIAGNOSIS — I129 Hypertensive chronic kidney disease with stage 1 through stage 4 chronic kidney disease, or unspecified chronic kidney disease: Secondary | ICD-10-CM | POA: Diagnosis not present

## 2017-06-24 DIAGNOSIS — J432 Centrilobular emphysema: Secondary | ICD-10-CM | POA: Insufficient documentation

## 2017-06-24 DIAGNOSIS — R0609 Other forms of dyspnea: Secondary | ICD-10-CM | POA: Diagnosis present

## 2017-06-24 DIAGNOSIS — N182 Chronic kidney disease, stage 2 (mild): Secondary | ICD-10-CM | POA: Insufficient documentation

## 2017-06-24 DIAGNOSIS — E7800 Pure hypercholesterolemia, unspecified: Secondary | ICD-10-CM | POA: Insufficient documentation

## 2017-06-24 DIAGNOSIS — Z87440 Personal history of urinary (tract) infections: Secondary | ICD-10-CM | POA: Insufficient documentation

## 2017-06-24 DIAGNOSIS — M19041 Primary osteoarthritis, right hand: Secondary | ICD-10-CM | POA: Diagnosis not present

## 2017-06-24 DIAGNOSIS — K219 Gastro-esophageal reflux disease without esophagitis: Secondary | ICD-10-CM | POA: Diagnosis not present

## 2017-06-24 DIAGNOSIS — J4540 Moderate persistent asthma, uncomplicated: Secondary | ICD-10-CM | POA: Diagnosis not present

## 2017-06-24 DIAGNOSIS — R0602 Shortness of breath: Secondary | ICD-10-CM

## 2017-06-24 DIAGNOSIS — Z6832 Body mass index (BMI) 32.0-32.9, adult: Secondary | ICD-10-CM | POA: Insufficient documentation

## 2017-06-24 DIAGNOSIS — Z87891 Personal history of nicotine dependence: Secondary | ICD-10-CM | POA: Insufficient documentation

## 2017-06-24 DIAGNOSIS — M19042 Primary osteoarthritis, left hand: Secondary | ICD-10-CM | POA: Diagnosis not present

## 2017-06-24 DIAGNOSIS — E669 Obesity, unspecified: Secondary | ICD-10-CM | POA: Diagnosis not present

## 2017-06-24 DIAGNOSIS — D573 Sickle-cell trait: Secondary | ICD-10-CM | POA: Diagnosis not present

## 2017-06-24 DIAGNOSIS — F329 Major depressive disorder, single episode, unspecified: Secondary | ICD-10-CM | POA: Diagnosis not present

## 2017-06-24 DIAGNOSIS — R0600 Dyspnea, unspecified: Secondary | ICD-10-CM

## 2017-06-24 HISTORY — PX: RIGHT HEART CATH: CATH118263

## 2017-06-24 LAB — POCT I-STAT 3, VENOUS BLOOD GAS (G3P V)
Acid-base deficit: 1 mmol/L (ref 0.0–2.0)
Bicarbonate: 25.3 mmol/L (ref 20.0–28.0)
Bicarbonate: 25.6 mmol/L (ref 20.0–28.0)
O2 Saturation: 69 %
O2 Saturation: 70 %
TCO2: 27 mmol/L (ref 22–32)
TCO2: 27 mmol/L (ref 22–32)
pCO2, Ven: 45.8 mmHg (ref 44.0–60.0)
pCO2, Ven: 46.1 mmHg (ref 44.0–60.0)
pH, Ven: 7.35 (ref 7.250–7.430)
pH, Ven: 7.353 (ref 7.250–7.430)
pO2, Ven: 38 mmHg (ref 32.0–45.0)
pO2, Ven: 38 mmHg (ref 32.0–45.0)

## 2017-06-24 LAB — BASIC METABOLIC PANEL
Anion gap: 8 (ref 5–15)
BUN: 14 mg/dL (ref 6–20)
CO2: 24 mmol/L (ref 22–32)
Calcium: 9.7 mg/dL (ref 8.9–10.3)
Chloride: 107 mmol/L (ref 101–111)
Creatinine, Ser: 1.42 mg/dL — ABNORMAL HIGH (ref 0.44–1.00)
GFR calc Af Amer: 42 mL/min — ABNORMAL LOW (ref >60)
GFR calc non Af Amer: 36 mL/min — ABNORMAL LOW (ref >60)
Glucose, Bld: 101 mg/dL — ABNORMAL HIGH (ref 65–99)
Potassium: 3.9 mmol/L (ref 3.5–5.1)
Sodium: 139 mmol/L (ref 135–145)

## 2017-06-24 LAB — PROTIME-INR
INR: 1.02
Prothrombin Time: 13.3 seconds (ref 11.4–15.2)

## 2017-06-24 LAB — CBC
HCT: 37.9 % (ref 36.0–46.0)
Hemoglobin: 12.8 g/dL (ref 12.0–15.0)
MCH: 30 pg (ref 26.0–34.0)
MCHC: 33.8 g/dL (ref 30.0–36.0)
MCV: 88.8 fL (ref 78.0–100.0)
Platelets: 198 10*3/uL (ref 150–400)
RBC: 4.27 MIL/uL (ref 3.87–5.11)
RDW: 13.2 % (ref 11.5–15.5)
WBC: 7.1 10*3/uL (ref 4.0–10.5)

## 2017-06-24 SURGERY — RIGHT HEART CATH
Anesthesia: LOCAL

## 2017-06-24 MED ORDER — ONDANSETRON HCL 4 MG/2ML IJ SOLN: 4.0000 mg | Freq: Four times a day (QID) | INTRAMUSCULAR | Status: DC | PRN

## 2017-06-24 MED ORDER — ASPIRIN 81 MG PO CHEW
81.0000 mg | CHEWABLE_TABLET | ORAL | Status: AC
  Administered 2017-06-24: 81 mg via ORAL

## 2017-06-24 MED ORDER — LIDOCAINE HCL 2 % IJ SOLN
INTRAMUSCULAR | Status: AC
  Filled 2017-06-24: qty 10

## 2017-06-24 MED ORDER — SODIUM CHLORIDE 0.9% FLUSH: 3.0000 mL | Freq: Two times a day (BID) | INTRAVENOUS | Status: DC

## 2017-06-24 MED ORDER — SODIUM CHLORIDE 0.9 % IV SOLN: 250.0000 mL | INTRAVENOUS | Status: DC | PRN

## 2017-06-24 MED ORDER — ASPIRIN 81 MG PO CHEW
CHEWABLE_TABLET | ORAL | Status: AC
  Administered 2017-06-24: 81 mg via ORAL
  Filled 2017-06-24: qty 1

## 2017-06-24 MED ORDER — SODIUM CHLORIDE 0.9% FLUSH: 3.0000 mL | INTRAVENOUS | Status: DC | PRN

## 2017-06-24 MED ORDER — MIDAZOLAM HCL 2 MG/2ML IJ SOLN
INTRAMUSCULAR | Status: AC
  Filled 2017-06-24: qty 2

## 2017-06-24 MED ORDER — SODIUM CHLORIDE 0.9 % IV SOLN
INTRAVENOUS | Status: DC
  Administered 2017-06-24: 10:00:00 via INTRAVENOUS

## 2017-06-24 MED ORDER — MIDAZOLAM HCL 2 MG/2ML IJ SOLN
INTRAMUSCULAR | Status: DC | PRN
  Administered 2017-06-24: 1 mg via INTRAVENOUS

## 2017-06-24 MED ORDER — HEPARIN (PORCINE) IN NACL 2-0.9 UNIT/ML-% IJ SOLN
INTRAMUSCULAR | Status: AC
  Filled 2017-06-24: qty 500

## 2017-06-24 MED ORDER — SODIUM CHLORIDE 0.9% FLUSH
3.0000 mL | INTRAVENOUS | Status: DC | PRN
Start: 2017-06-24 — End: 2017-06-24

## 2017-06-24 MED ORDER — HEPARIN (PORCINE) IN NACL 2-0.9 UNIT/ML-% IJ SOLN
INTRAMUSCULAR | Status: AC | PRN
  Administered 2017-06-24: 500 mL

## 2017-06-24 MED ORDER — LIDOCAINE HCL 2 % IJ SOLN
INTRAMUSCULAR | Status: DC | PRN
  Administered 2017-06-24: 2 mL

## 2017-06-24 MED ORDER — ACETAMINOPHEN 325 MG PO TABS: 650.0000 mg | ORAL_TABLET | ORAL | Status: DC | PRN

## 2017-06-24 SURGICAL SUPPLY — 4 items
CATH BALLN WEDGE 5F 110CM (CATHETERS) ×2 IMPLANT
PACK CARDIAC CATHETERIZATION (CUSTOM PROCEDURE TRAY) ×2 IMPLANT
SHEATH GLIDE SLENDER 4/5FR (SHEATH) ×2 IMPLANT
TRANSDUCER W/STOPCOCK (MISCELLANEOUS) ×2 IMPLANT

## 2017-06-24 NOTE — Interval H&P Note (Signed)
History and Physical Interval Note:  06/24/2017 10:03 AM  Valerie Jackson  has presented today for surgery, with the diagnosis of sob  The various methods of treatment have been discussed with the patient and family. After consideration of risks, benefits and other options for treatment, the patient has consented to  Procedure(s): RIGHT HEART CATH (N/A) as a surgical intervention .  The patient's history has been reviewed, patient examined, no change in status, stable for surgery.  I have reviewed the patient's chart and labs.  Questions were answered to the patient's satisfaction.     Bensimhon, Quillian Quince

## 2017-06-24 NOTE — H&P (View-Only) (Signed)
Subjective:    Patient ID: Valerie Jackson, female    DOB: 08/08/1946, 71 y.o.   MRN: [id number]  Synopsis: Referred in June 2018 by the cardiology clinic for evaluation of dyspnea with abnormal pulmonary function testing.She has mild centrilobular emphysema and asthma but not COPD.  She smoked for 40 years and quit smoking in 2013.   HPI Chief Complaint  Patient presents with  . Follow-up    Pt still becoming SOB on exertion but states it is a little better since last visit. Denies any cough or CP.   Valerie Jackson says that her breathing is a little better than when I saw her the last time.  She can go up stairs once without dyspnea but not twice.  She hasn't walked much.  Yelling will make her more short of breath.  She says that the [place] has helped "a little bit".  She has intermittent chest tightness.  She denies cough unless she chokes on food.  No wheezing that she knows of. No bronchitis or pneumonia.   She has had pre-syncope spells and says she avoids hot showers because they make her feel light headed. She can't exercise much because she feels light headed when she does this.  Some of her lightheadedness has to do with fast changes in position, but this is rare.    She doesn't sleep well, has a hard time falling asleep.  She has daytime sleepiness and sleeps throughout the day.  She has been told that she snores.   Past Medical History:  Diagnosis Date  . Arthritis    fingers  . Blood clot in vein 1968  . Cataracts, bilateral   . Chronic kidney disease    CKD stage 2  . COPD (chronic obstructive pulmonary disease) ([place])   . Depression   . Diverticulitis 09/2013   with perforation  . Dizziness    with decreased BP readings or bending over  . Floaters   . GERD (gastroesophageal reflux disease)   . H/O hiatal hernia   . Headache(784.0)    migraine every 5- to 6 years  . Heart murmur since age 25   mild  . Hepatitis age 20 or 42   had heapatitis, not sure which kind  .  History of blood transfusion 1965  . History of frequent urinary tract infections   . Hypercholesteremia   . Hypertension   . Hypothyroid   . Obesity   . Pericolonic abscess 2015  . Pneumonia   . Sickle cell trait ([place])           Review of Systems  Constitutional: Negative for fever and unexpected weight change.  HENT: Negative for congestion, dental problem, ear pain, nosebleeds, postnasal drip, rhinorrhea, sinus pressure, sneezing, sore throat and trouble swallowing.   Eyes: Negative for redness and itching.  Respiratory: Positive for shortness of breath. Negative for cough, chest tightness and wheezing.   Cardiovascular: Negative for palpitations and leg swelling.  Gastrointestinal: Negative for nausea and vomiting.  Genitourinary: Negative for dysuria.  Musculoskeletal: Negative for joint swelling.  Skin: Negative for rash.  Neurological: Negative for headaches.  Hematological: Does not bruise/bleed easily.  Psychiatric/Behavioral: Negative for dysphoric mood. The patient is not nervous/anxious.        Objective:   Physical Exam  Vitals:   06/09/17 1120  BP: 112/68  Pulse: 96  SpO2: 94%  Weight: 202 lb 6.4 oz (91.8 kg)  Height: 5' 5.5" (1.664 m)    Gen: obese,  but well appearing HENT: OP clear, TM's clear, neck supple PULM: CTA B, normal percussion CV: RRR, no mgr, trace edema GI: BS+, soft, nontender Derm: no cyanosis or rash Psyche: normal mood and affect    CBC    Component Value Date/Time   WBC 7.6 03/30/2017 0949   RBC 4.35 03/30/2017 0949   HGB 13.3 03/30/2017 0949   HGB 10.7 04/16/2014   HCT 38.5 03/30/2017 0949   HCT 32 04/16/2014   PLT 216.0 03/30/2017 0949   MCV 88.5 03/30/2017 0949   MCV 86.0 04/16/2014   MCH 29.7 03/03/2016 1530   MCHC 34.5 03/30/2017 0949   RDW 14.0 03/30/2017 0949   RDW 14.2 04/16/2014   LYMPHSABS 2.2 03/30/2017 0949   MONOABS 0.4 03/30/2017 0949   EOSABS 0.3 03/30/2017 0949   BASOSABS 0.1 03/30/2017 0949       Chest imaging: 2018 chest x-ray images independently reviewed showing flattening of the diaphragms but no pulmonary parenchymal abnormality 2015 chest CT images independently reviewed showing possible cysts versus mild centrilobular emphysema in the left lung only, otherwise normal pulmonary parenchyma June 2018 high-resolution CT chest images independently reviewed showing mild centrilobular emphysema and an upper lobe distribution no interstitial lung disease findings July 2018 VQ scan negative for pulmonary bolus him  PFT: May 2018 PFT ratio 69%, FEV1 1.55 L, improved to 1.74 L with resolution of airflow obstruction, 12% change post bronchodilator, change was only 190 mL however, forced vital capacity 2.35 L 92% predicted, total lung capacity 4.08 L 76% predicted, DLCO 11.81 43% predicted  Echo: April 2018: Mild concentric left ventricular hypertrophy, systolic function normal, LVEF 50-55%, normal wall motion, grade 1 diastolic dysfunction, aortic valve normal, trivial mitral regurgitation, right ventricle size and thickness and systolic function normal, atrial septum normal, trivial tricuspid regurgitation, pulmonary artery systolic pressure estimated 31 mmHg  Exercise stress test: August 2017 exercise stress test: Negative stress test without evidence of ischemia, frequent PVCs noted  Exhaled Nitric Oxide: 02/2017 22 ppm      Assessment & Plan:   No diagnosis found.  Abnormal lung function test, decreased diffusion capacity Moderate persistent asthma Centrilobular emphysema Prior tobacco use Snoring, daytime somnolence   Discussion: Valerie Jackson continues to complain of shortness of breath despite initiation of inhaled steroids for her asthma-like syndrome. Further, she does not have significant asthma symptoms in that she has minimal cough and no wheeze. So given her persistent shortness of breath with an abnormal diffusion capacity seen on pulmonary function testing and no  evidence of interstitial lung disease I think it's best rest to assess for pulmonary hypertension. I am worried about the possibility of sleep apnea as a potential cause. She does have mild centrilobular emphysema but I don't think it's enough to explain the extent of her dyspnea.  Plan: Centrilobular emphysema and asthma: Continue our new 46 as you're doing Albuterol as needed for chest tightness wheezing or shortness of breath Please reconsider a flu shot  Abnormal lung function test with shortness of breath: I am concerned about the possibility of pulmonary hypertension I am going to refer you back to cardiology for a right heart catheterization to evaluate this further  For fatigue: I am concerned about the possibility of sleep apnea, we will order a sleep study  We will see you back in 6-8 weeks or sooner if needed    Current Outpatient Prescriptions:  .  acetaminophen (TYLENOL) 500 MG tablet, Take 1,000 mg by mouth every 6 (six) hours as needed for  headache., Disp: , Rfl:  .  calcium carbonate (OS-CAL - DOSED IN MG OF ELEMENTAL CALCIUM) 1250 (500 CA) MG tablet, Take 1 tablet by mouth daily as needed. Reported on 04/03/2016, Disp: , Rfl:  .  cholecalciferol (VITAMIN D) 1000 UNITS tablet, Take 1,000 Units by mouth daily., Disp: , Rfl:  .  Fluticasone Furoate (ARNUITY ELLIPTA) 100 MCG/ACT AEPB, Inhale 1 puff into the lungs daily., Disp: 90 each, Rfl: 3 .  levothyroxine (SYNTHROID, LEVOTHROID) 112 MCG tablet, TAKE 1 TABLET EVERY DAY, Disp: 90 tablet, Rfl: 0 .  SUMAtriptan-naproxen (TREXIMET) 85-500 MG tablet, Take 1 tablet by mouth every 2 (two) hours as needed for migraine., Disp: 10 tablet, Rfl: 1 .  zolpidem (AMBIEN) 5 MG tablet, Take 1 tablet (5 mg total) by mouth at bedtime as needed for sleep. (Patient not taking: Reported on 06/09/2017), Disp: 30 tablet, Rfl: 5

## 2017-06-24 NOTE — Discharge Instructions (Signed)
RIGHT HEART CATHETERIZATION, Care After This sheet gives you information about how to care for yourself after your procedure. Your health care provider may also give you more specific instructions. If you have problems or questions, contact your health care provider. What can I expect after the procedure? After the procedure, it is common to have:  Bruising or mild discomfort in the area where the IV was inserted (insertion site).  Follow these instructions at home: Eating and drinking  Follow instructions from your health care provider about eating or drinking restrictions.  Drink a lot of fluids for the first several days after the procedure, as directed by your health care provider. This helps to wash (flush) the contrast out of your body. Examples of healthy fluids include water or low-calorie drinks. General instructions  Check your IV insertion area every day for signs of infection. Check for: ? Redness, swelling, or pain. ? Fluid or blood. ? Warmth. ? Pus or a bad smell.  Take over-the-counter and prescription medicines only as told by your health care provider.  Rest and return to your normal activities as told by your health care provider. Ask your health care provider what activities are safe for you.  Do not drive for 24 hours if you were given a medicine to help you relax (sedative), or until your health care provider approves.  Keep all follow-up visits as told by your health care provider. This is important. Contact a health care provider if:  Your skin becomes itchy or you develop a rash or hives.  You have a fever that does not get better with medicine.  You feel nauseous.  You vomit.  You have redness, swelling, or pain around the insertion site.  You have fluid or blood coming from the insertion site.  Your insertion area feels warm to the touch.  You have pus or a bad smell coming from the insertion site. Get help right away if:  You have difficulty  breathing or shortness of breath.  You develop chest pain.  You faint.  You feel very dizzy. These symptoms may represent a serious problem that is an emergency. Do not wait to see if the symptoms will go away. Get medical help right away. Call your local emergency services (911 in the [place] Do not drive yourself to the hospital. Summary  After your procedure, it is common to have bruising or mild discomfort in the area where the IV was inserted.  You should check your IV insertion area every day for signs of infection.  Take over-the-counter and prescription medicines only as told by your health care provider.  You should drink a lot of fluids for the first several days after the procedure to help flush the contrast from your body. This information is not intended to replace advice given to you by your health care provider. Make sure you discuss any questions you have with your health care provider. Document Released: 06/21/2013 Document Revised: 07/25/2016 Document Reviewed: 07/25/2016 Elsevier Interactive Patient Education  2017 [employer].

## 2017-06-25 DIAGNOSIS — G4733 Obstructive sleep apnea (adult) (pediatric): Secondary | ICD-10-CM | POA: Diagnosis not present

## 2017-06-25 NOTE — Procedures (Signed)
Patient Name: Valerie Jackson, Valerie Jackson Date: 06/21/2017 Gender: Female D.O.B: 1946-08-27 Age (years): 71 Referring Provider: Simonne Maffucci Height (inches): 47 Interpreting Physician: Kara Mead MD, ABSM Weight (lbs): 200 RPSGT: Carolin Coy BMI: 31 MRN: [id number] Neck Size: 14.50   CLINICAL INFORMATION Sleep Study Type: NPSG  Indication for sleep study: Daytime Fatigue, Fatigue, Hypertension, Obesity  Epworth Sleepiness Score: 8  SLEEP STUDY TECHNIQUE As per the AASM Manual for the Scoring of Sleep and Associated Events v2.3 (April 2016) with a hypopnea requiring 4% desaturations.  The channels recorded and monitored were frontal, central and occipital EEG, electrooculogram (EOG), submentalis EMG (chin), nasal and oral airflow, thoracic and abdominal wall motion, anterior tibialis EMG, snore microphone, electrocardiogram, and pulse oximetry.  SLEEP ARCHITECTURE The study was initiated at 10:27:10 PM and ended at 5:05:56 AM.  Sleep onset time was 97.5 minutes and the sleep efficiency was 64.8%. The total sleep time was 258.5 minutes.  Stage REM latency was 41.0 minutes.  The patient spent 6.00% of the night in stage N1 sleep, 77.95% in stage N2 sleep, 0.00% in stage N3 and 16.05% in REM.  Alpha intrusion was absent.  Supine sleep was 0.00%.  RESPIRATORY PARAMETERS The overall apnea/hypopnea index (AHI) was 4.4 per hour. There were 9 total apneas, including 7 obstructive, 0 central and 2 mixed apneas. There were 10 hypopneas and 9 RERAs.  The AHI during Stage REM sleep was 23.1 per hour.  AHI while supine was N/A per hour.  The mean oxygen saturation was 95.00%. The minimum SpO2 during sleep was 85.00%.  moderate snoring was noted during this study.  CARDIAC DATA The 2 lead EKG demonstrated sinus rhythm. The mean heart rate was 67.49 beats per minute. Other EKG findings include: frequent PVCs.   LEG MOVEMENT DATA The total PLMS were 62 with a resulting PLMS  index of 14.39. Associated arousal with leg movement index was 0.7 .  IMPRESSIONS - No significant obstructive sleep apnea occurred during this study (AHI = 4.4/h). - No significant central sleep apnea occurred during this study (CAI = 0.0/h). - Mild oxygen desaturation was noted during this study (Min O2 = 85.00%). - The patient snored with moderate snoring volume. - EKG findings include PVCs. - Mild periodic limb movements of sleep occurred during the study. No significant associated arousals.   DIAGNOSIS - No evidence of sleep disordered breathing  - Frequent PVCs   RECOMMENDATIONS - Avoid alcohol, sedatives and other CNS depressants that may worsen sleep apnea and disrupt normal sleep architecture. - Sleep hygiene should be reviewed to assess factors that may improve sleep quality. - Weight management and regular exercise should be initiated or continued if appropriate.  Kara Mead MD Board Certified in [place]

## 2017-06-29 ENCOUNTER — Telehealth: Payer: Self-pay | Admitting: Internal Medicine

## 2017-06-29 ENCOUNTER — Other Ambulatory Visit: Payer: Self-pay | Admitting: Internal Medicine

## 2017-06-29 DIAGNOSIS — L309 Dermatitis, unspecified: Secondary | ICD-10-CM | POA: Insufficient documentation

## 2017-06-29 MED ORDER — CLOBETASOL PROPIONATE 0.05 % EX CREA: 1.0000 "application " | TOPICAL_CREAM | Freq: Two times a day (BID) | CUTANEOUS | 2 refills | Status: DC

## 2017-06-29 NOTE — Telephone Encounter (Signed)
Pt called for a refill of her clobetasol cream (TEMOVATE) 0.05 % Wants it sent to [facility] mail order  Please advise medication is historical provider

## 2017-07-27 ENCOUNTER — Encounter (HOSPITAL_BASED_OUTPATIENT_CLINIC_OR_DEPARTMENT_OTHER): Payer: Medicare Other

## 2017-08-11 ENCOUNTER — Other Ambulatory Visit: Payer: Self-pay | Admitting: Internal Medicine

## 2017-08-11 DIAGNOSIS — E890 Postprocedural hypothyroidism: Secondary | ICD-10-CM

## 2017-08-16 ENCOUNTER — Ambulatory Visit: Payer: Medicare Other | Admitting: Adult Health

## 2017-08-17 ENCOUNTER — Ambulatory Visit (INDEPENDENT_AMBULATORY_CARE_PROVIDER_SITE_OTHER): Payer: Medicare Other | Admitting: Adult Health

## 2017-08-17 ENCOUNTER — Encounter: Payer: Self-pay | Admitting: Adult Health

## 2017-08-17 DIAGNOSIS — R0609 Other forms of dyspnea: Secondary | ICD-10-CM | POA: Diagnosis not present

## 2017-08-17 DIAGNOSIS — R0600 Dyspnea, unspecified: Secondary | ICD-10-CM

## 2017-08-17 DIAGNOSIS — J4530 Mild persistent asthma, uncomplicated: Secondary | ICD-10-CM

## 2017-08-17 NOTE — Patient Instructions (Signed)
Continue on ARNUITY . Rinse after use.  Follow up with Dr. Lake Bells in 6 months and As needed

## 2017-08-17 NOTE — Progress Notes (Addendum)
@Patient  ID: Valerie Jackson, female    DOB: Mar 30, 1946, 71 y.o.   MRN: [id number]  Chief Complaint  Patient presents with  . Follow-up    asthma     Referring provider: Janith Lima, MD  HPI:  71 year old female never smoker seen for pulmonary consult 02/23/2017 for shortness of breath  TEST   Chest imaging: 2018 chest x-ray images independently reviewed showing flattening of the diaphragms but no pulmonary parenchymal abnormality 2015 chest CT images independently reviewed showing possible cysts versus mild centrilobular emphysema in the left lung only, otherwise normal pulmonary parenchyma  PFT: May 2018 PFT ratio 69%, FEV1 1.55 L, improved to 1.74 L with resolution of airflow obstruction, 12% change post bronchodilator, change was only 190 mL however, forced vital capacity 2.35 L 92% predicted, total lung capacity 4.08 L 76% predicted, DLCO 11.81 43% predicted  Echo: April 2018: Mild concentric left ventricular hypertrophy, systolic function normal, LVEF 50-55%, normal wall motion, grade 1 diastolic dysfunction, aortic valve normal, trivial mitral regurgitation, right ventricle size and thickness and systolic function normal, atrial septum normal, trivial tricuspid regurgitation, pulmonary artery systolic pressure estimated 31 mmHg  Exercise stress test: August 2017 exercise stress test: Negative stress test without evidence of ischemia, frequent PVCs noted  Exhaled Nitric Oxide: 02/2017 22 ppm   08/17/17   Follow up : Asthma  Pt returns for a 3 month follow up . She has underwent an extensive workup for dyspnea .  PFT were consistent with asthma process with mild airflow obstruction with reversibility . She was started on ICS inhaler. Does feel it helped with some of her breathing issues . Able to take in deeper breath and had more activity tolerance.  PFT did show a decreased DLCO . Previous  HRCT chest was neg for ILD .+ emphysema/mild bronchial wall thickening .  Echo in 12/2016 showed mild PAH , gr 1 DD . She underwent a RHC on 06/24/17 that showed NO PAH.  She denies any signficant cough or congestion .  She is not as active since moving to [place] and weight is up 25-30 lbs over last few years.  She does not like the area and has limited transportation so is not able to go exercise.  She gets winded with activity at times. Arnity seems to help some.  Discussed further testing with CPST but she does not want to undergo any further tests at this time.        Allergies  Allergen Reactions  . Estrogens     Blood clot in leg    Immunization History  Administered Date(s) Administered  . Influenza, High Dose Seasonal PF 07/19/2013  . Pneumococcal Conjugate-13 07/19/2013  . Pneumococcal Polysaccharide-23 09/25/2015  . Td 09/15/2011    Past Medical History:  Diagnosis Date  . Arthritis    fingers  . Blood clot in vein 1968  . Cataracts, bilateral   . Chronic kidney disease    CKD stage 2  . COPD (chronic obstructive pulmonary disease) ([place])   . Depression   . Diverticulitis 09/2013   with perforation  . Dizziness    with decreased BP readings or bending over  . Floaters   . GERD (gastroesophageal reflux disease)   . H/O hiatal hernia   . Headache(784.0)    migraine every 5- to 6 years  . Heart murmur since age 71   mild  . Hepatitis age 68 or 40   had heapatitis, not sure which kind  . History  of blood transfusion 1965  . History of frequent urinary tract infections   . Hypercholesteremia   . Hypertension   . Hypothyroid   . Obesity   . Pericolonic abscess 2015  . Pneumonia   . Sickle cell trait ([place])     Tobacco History: Social History   Tobacco Use  Smoking Status Former Smoker  . Packs/day: 0.50  . Years: 52.00  . Pack years: 26.00  . Types: Cigarettes  . Last attempt to quit: 09/06/2012  . Years since quitting: 4.9  Smokeless Tobacco Never Used  Tobacco Comment   never smoked a full PPD.    Counseling given: Not  Answered Comment: never smoked a full PPD.    Outpatient Encounter Medications as of 08/17/2017  Medication Sig  . acetaminophen (TYLENOL) 500 MG tablet Take 1,000 mg by mouth every 6 (six) hours as needed for headache.  . calcium carbonate (OS-CAL - DOSED IN MG OF ELEMENTAL CALCIUM) 1250 (500 CA) MG tablet Take 1 tablet by mouth daily as needed. Reported on 04/03/2016  . cholecalciferol (VITAMIN D) 1000 UNITS tablet Take 2,000 Units by mouth daily.   . clobetasol cream (TEMOVATE) 6.23 % Apply 1 application topically 2 (two) times daily.  . Fluticasone Furoate (ARNUITY ELLIPTA) 100 MCG/ACT AEPB Inhale 1 puff into the lungs daily.  . hydrochlorothiazide (MICROZIDE) 12.5 MG capsule Take 12.5 mg by mouth daily.  [name] levothyroxine (SYNTHROID, LEVOTHROID) 112 MCG tablet TAKE 1 TABLET EVERY DAY  . SUMAtriptan-naproxen (TREXIMET) 85-500 MG tablet Take 1 tablet by mouth every 2 (two) hours as needed for migraine.  [name] zolpidem (AMBIEN) 5 MG tablet Take 1 tablet (5 mg total) by mouth at bedtime as needed for sleep.   No facility-administered encounter medications on file as of 08/17/2017.      Review of Systems  Constitutional:   No  weight loss, night sweats,  Fevers, chills,  +fatigue, or  lassitude.  HEENT:   No headaches,  Difficulty swallowing,  Tooth/dental problems, or  Sore throat,                No sneezing, itching, ear ache, nasal congestion, post nasal drip,   CV:  No chest pain,  Orthopnea, PND, swelling in lower extremities, anasarca, dizziness, palpitations, syncope.   GI  No heartburn, indigestion, abdominal pain, nausea, vomiting, diarrhea, change in bowel habits, loss of appetite, bloody stools.   Resp:    No chest wall deformity  Skin: no rash or lesions.  GU: no dysuria, change in color of urine, no urgency or frequency.  No flank pain, no hematuria   MS:  No joint pain or swelling.  No decreased range of motion.  No back pain.    Physical Exam  BP 132/80 (BP Location:  Right Arm, Cuff Size: Normal)   Pulse 93   Ht 5' 5"  (1.651 m)   Wt 208 lb 6.4 oz (94.5 kg)   SpO2 97%   BMI 34.68 kg/m   GEN: A/Ox3; pleasant , NAD, obese    HEENT:  [place]/AT,  EACs-clear, TMs-wnl, NOSE-clear, THROAT-clear, no lesions, no postnasal drip or exudate noted.   NECK:  Supple w/ fair ROM; no JVD; normal carotid impulses w/o bruits; no thyromegaly or nodules palpated; no lymphadenopathy.    RESP  Clear  P & A; w/o, wheezes/ rales/ or rhonchi. no accessory muscle use, no dullness to percussion  CARD:  RRR, no m/r/g, no peripheral edema, pulses intact, no cyanosis or clubbing.  GI:  Soft & nt; nml bowel sounds; no organomegaly or masses detected.   Musco: Warm bil, no deformities or joint swelling noted.   Neuro: alert, no focal deficits noted.    Skin: Warm, no lesions or rashes    Lab Results:  CBC    Component Value Date/Time   WBC 7.1 06/24/2017 0846   RBC 4.27 06/24/2017 0846   HGB 12.8 06/24/2017 0846   HGB 10.7 04/16/2014   HCT 37.9 06/24/2017 0846   HCT 32 04/16/2014   PLT 198 06/24/2017 0846   MCV 88.8 06/24/2017 0846   MCV 86.0 04/16/2014   MCH 30.0 06/24/2017 0846   MCHC 33.8 06/24/2017 0846   RDW 13.2 06/24/2017 0846   RDW 14.2 04/16/2014   LYMPHSABS 2.2 03/30/2017 0949   MONOABS 0.4 03/30/2017 0949   EOSABS 0.3 03/30/2017 0949   BASOSABS 0.1 03/30/2017 0949    BMET    Component Value Date/Time   NA 139 06/24/2017 0846   NA 140 04/16/2014   K 3.9 06/24/2017 0846   K 4.2 04/16/2014   CL 107 06/24/2017 0846   CL 109 04/16/2014   CO2 24 06/24/2017 0846   CO2 22 04/16/2014   GLUCOSE 101 (H) 06/24/2017 0846   BUN 14 06/24/2017 0846   BUN 21 04/16/2014   CREATININE 1.42 (H) 06/24/2017 0846   CREATININE 1.14 04/16/2014   CALCIUM 9.7 06/24/2017 0846   GFRNONAA 36 (L) 06/24/2017 0846   GFRAA 42 (L) 06/24/2017 0846    BNP    Component Value Date/Time   BNP 31.3 12/14/2016 1329    ProBNP    Component Value Date/Time   PROBNP 9.6  02/20/2013 1600    Imaging: No results found.   Assessment & Plan:   Asthma Mild Asthma with some sx improvement on Arnuity .   Plan  Patient Instructions  Continue on ARNUITY . Rinse after use.  Follow up with Dr. Lake Bells in 6 months and As needed       Dyspnea Dyspnea in a never smoker  - suspect is multifactoral with underlying asthma, obesity, D CHF , and deconditioning  She has underwent an exetensive workup that has showed a neg card stress test. No PAH on RHC, HRCT chest neg for ILD, no anemia on labs.  She declines CPST .  Do recommended advancing activity as tolerated.  Weight loss . Continue on Asthma medication.  Plan  Patient Instructions  Continue on ARNUITY . Rinse after use.  Follow up with Dr. Lake Bells in 6 months and As needed          Rexene Edison, NP

## 2017-08-19 ENCOUNTER — Other Ambulatory Visit: Payer: Self-pay

## 2017-08-19 DIAGNOSIS — J45909 Unspecified asthma, uncomplicated: Secondary | ICD-10-CM | POA: Insufficient documentation

## 2017-08-19 NOTE — Assessment & Plan Note (Signed)
Mild Asthma with some sx improvement on Arnuity .   Plan  Patient Instructions  Continue on ARNUITY . Rinse after use.  Follow up with Dr. Lake Bells in 6 months and As needed

## 2017-08-19 NOTE — Assessment & Plan Note (Signed)
Dyspnea in a never smoker  - suspect is multifactoral with underlying asthma, obesity, D CHF , and deconditioning  She has underwent an exetensive workup that has showed a neg card stress test. No PAH on RHC, HRCT chest neg for ILD, no anemia on labs.  She declines CPST .  Do recommended advancing activity as tolerated.  Weight loss . Continue on Asthma medication.  Plan  Patient Instructions  Continue on ARNUITY . Rinse after use.  Follow up with Dr. Lake Bells in 6 months and As needed

## 2017-08-19 NOTE — Progress Notes (Signed)
Reviewed, agree 

## 2017-08-27 ENCOUNTER — Encounter: Payer: Self-pay | Admitting: Obstetrics & Gynecology

## 2017-08-27 ENCOUNTER — Ambulatory Visit (INDEPENDENT_AMBULATORY_CARE_PROVIDER_SITE_OTHER): Payer: Medicare Other | Admitting: Obstetrics & Gynecology

## 2017-08-27 VITALS — BP 122/80 | Ht 65.0 in | Wt 205.0 lb

## 2017-08-27 DIAGNOSIS — H2513 Age-related nuclear cataract, bilateral: Secondary | ICD-10-CM | POA: Diagnosis not present

## 2017-08-27 DIAGNOSIS — H35372 Puckering of macula, left eye: Secondary | ICD-10-CM | POA: Diagnosis not present

## 2017-08-27 DIAGNOSIS — Z6834 Body mass index (BMI) 34.0-34.9, adult: Secondary | ICD-10-CM

## 2017-08-27 DIAGNOSIS — H43813 Vitreous degeneration, bilateral: Secondary | ICD-10-CM | POA: Diagnosis not present

## 2017-08-27 DIAGNOSIS — E6609 Other obesity due to excess calories: Secondary | ICD-10-CM | POA: Diagnosis not present

## 2017-08-27 DIAGNOSIS — Z780 Asymptomatic menopausal state: Secondary | ICD-10-CM

## 2017-08-27 DIAGNOSIS — Z01419 Encounter for gynecological examination (general) (routine) without abnormal findings: Secondary | ICD-10-CM

## 2017-08-27 DIAGNOSIS — Z13820 Encounter for screening for osteoporosis: Secondary | ICD-10-CM

## 2017-08-27 DIAGNOSIS — H524 Presbyopia: Secondary | ICD-10-CM | POA: Diagnosis not present

## 2017-08-27 NOTE — Progress Notes (Signed)
Valerie Jackson 23-May-1946 [id number]   History:    71 y.o.  G1P1L1  Single.  Daughter doing well.  RP:  Established patient presenting for annual gyn exam   HPI: Menopause on no hormone replacement therapy.  No postmenopausal bleeding.  No pelvic pain.  Abstinent times 20 years.  Breasts normal.  Urine/BMs wnl.  Patient shared with me that she feels lonely here in [place].  She came here to be close to her daughter, but not seeing each other very often.  Recommended her to become a member at the [employer], maybe get involved in voluntary work.  Mildly depressive mood.  No suicidal ideation.  Health labs with family physician, Dr. Ronnald Ramp.  Followed by Pulmonology for Asthma.  Past medical history,surgical history, family history and social history were all reviewed and documented in the EPIC chart.  Gynecologic History No LMP recorded. Patient is postmenopausal. Contraception: post menopausal status Last Pap: 2016. Results were: normal Last mammogram: 2016. Results were: normal [place] 2015 Bone Density 2016  Obstetric History OB History  Gravida Para Term Preterm AB Living  1 1       2   SAB TAB Ectopic Multiple Live Births          1    # Outcome Date GA Lbr Len/2nd Weight Sex Delivery Anes PTL Lv  1 Para     F Vag-Spont   LIV    y   6  uu               uy  ROS: A ROS was performed and pertinent positives and negatives are included in the history.  GENERAL: No fevers or chills. HEENT: No change in vision, no earache, sore throat or sinus congestion. NECK: No pain or stiffness. CARDIOVASCULAR: No chest pain or pressure. No palpitations. PULMONARY: No shortness of breath, cough or wheeze. GASTROINTESTINAL: No abdominal pain, nausea, vomiting or diarrhea, melena or bright red blood per rectum. GENITOURINARY: No urinary frequency, urgency, hesitancy or dysuria. MUSCULOSKELETAL: No joint or muscle pain, no back pain, no recent trauma. DERMATOLOGIC: No rash, no itching, no  lesions. ENDOCRINE: No polyuria, polydipsia, no heat or cold intolerance. No recent change in weight. HEMATOLOGICAL: No anemia or easy bruising or bleeding. NEUROLOGIC: No headache, seizures, numbness, tingling or weakness. PSYCHIATRIC: No depression, no loss of interest in normal activity or change in sleep pattern.     Exam:   BP 122/80   Ht 5' 5"  (1.651 m)   Wt 205 lb (93 kg)   BMI 34.11 kg/m   Body mass index is 34.11 kg/m.  General appearance : Well developed well nourished female. No acute distress HEENT: Eyes: no retinal hemorrhage or exudates,  Neck supple, trachea midline, no carotid bruits, no thyroidmegaly Lungs: Clear to auscultation, no rhonchi or wheezes, or rib retractions  Heart: Regular rate and rhythm, no murmurs or gallops Breast:Examined in sitting and supine position were symmetrical in appearance, no palpable masses or tenderness,  no skin retraction, no nipple inversion, no nipple discharge, no skin discoloration, no axillary or supraclavicular lymphadenopathy Abdomen: no palpable masses or tenderness, no rebound or guarding Extremities: no edema or skin discoloration or tenderness  Pelvic: Vulva normal  Bartholin, Urethra, Skene Glands: Within normal limits             Vagina: No gross lesions or discharge  Cervix: No gross lesions or discharge  Uterus  AV, normal size, shape and consistency, non-tender and mobile  Adnexa  Without masses or tenderness  Anus and perineum  normal    Assessment/Plan:  71 y.o. female for annual exam   1. Well female exam with routine gynecological exam Normal gynecologic exam and menopause.  Always had normal Pap tests, last 09/2014.  Will repeat Pap test at 3-5 years.  Breast exam normal.  Will schedule screening mammogram at the breast center.  Colonoscopy 2015.  Health labs with family physician, Dr. Ronnald Ramp.  2. Menopause present Well on no hormone replacement therapy.  No postmenopausal bleeding.  3. Screening for  osteoporosis Vitamin D supplements, calcium rich nutrition and weightbearing physical activity recommended.  Will follow up here for bone density. - [facility] Bone Density; Future  4. Class 1 obesity due to excess calories without serious comorbidity with body mass index (BMI) of 34.0 to 34.9 in adult Recommend weight loss with low calorie/low carb diet, for example [employer].  Regular aerobic and weight lifting physical activity recommended.  Counseling on above issues more than 50% for 10 minutes.  Princess Bruins MD, 11:02 AM 08/27/2017

## 2017-08-27 NOTE — Patient Instructions (Signed)
1. Well female exam with routine gynecological exam Normal gynecologic exam and menopause.  Always had normal Pap tests, last 09/2014.  Will repeat Pap test at 3-5 years.  Breast exam normal.  Will schedule screening mammogram at the breast center.  Colonoscopy 2015.  Health labs with family physician, Dr. Ronnald Ramp.  2. Menopause present Well on no hormone replacement therapy.  No postmenopausal bleeding.  3. Screening for osteoporosis Vitamin D supplements, calcium rich nutrition and weightbearing physical activity recommended.  Will follow up here for bone density. - [facility] Bone Density; Future  4. Class 1 obesity due to excess calories without serious comorbidity with body mass index (BMI) of 34.0 to 34.9 in adult Recommend weight loss with low calorie/low carb diet, for example [employer].  Regular aerobic and weight lifting physical activity recommended.  Valerie Jackson, it was a pleasure meeting you today!   Health Maintenance for Postmenopausal Women Menopause is a normal process in which your reproductive ability comes to an end. This process happens gradually over a span of months to years, usually between the ages of 45 and 61. Menopause is complete when you have missed 12 consecutive menstrual periods. It is important to talk with your health care provider about some of the most common conditions that affect postmenopausal women, such as heart disease, cancer, and bone loss (osteoporosis). Adopting a healthy lifestyle and getting preventive care can help to promote your health and wellness. Those actions can also lower your chances of developing some of these common conditions. What should I know about menopause? During menopause, you may experience a number of symptoms, such as:  Moderate-to-severe hot flashes.  Night sweats.  Decrease in sex drive.  Mood swings.  Headaches.  Tiredness.  Irritability.  Memory problems.  Insomnia.  Choosing to treat or not to treat  menopausal changes is an individual decision that you make with your health care provider. What should I know about hormone replacement therapy and supplements? Hormone therapy products are effective for treating symptoms that are associated with menopause, such as hot flashes and night sweats. Hormone replacement carries certain risks, especially as you become older. If you are thinking about using estrogen or estrogen with progestin treatments, discuss the benefits and risks with your health care provider. What should I know about heart disease and stroke? Heart disease, heart attack, and stroke become more likely as you age. This may be due, in part, to the hormonal changes that your body experiences during menopause. These can affect how your body processes dietary fats, triglycerides, and cholesterol. Heart attack and stroke are both medical emergencies. There are many things that you can do to help prevent heart disease and stroke:  Have your blood pressure checked at least every 1-2 years. High blood pressure causes heart disease and increases the risk of stroke.  If you are 57-55 years old, ask your health care provider if you should take aspirin to prevent a heart attack or a stroke.  Do not use any tobacco products, including cigarettes, chewing tobacco, or electronic cigarettes. If you need help quitting, ask your health care provider.  It is important to eat a healthy diet and maintain a healthy weight. ? Be sure to include plenty of vegetables, fruits, low-fat dairy products, and lean protein. ? Avoid eating foods that are high in solid fats, added sugars, or salt (sodium).  Get regular exercise. This is one of the most important things that you can do for your health. ? Try to exercise  for at least 150 minutes each week. The type of exercise that you do should increase your heart rate and make you sweat. This is known as moderate-intensity exercise. ? Try to do strengthening  exercises at least twice each week. Do these in addition to the moderate-intensity exercise.  Know your numbers.Ask your health care provider to check your cholesterol and your blood glucose. Continue to have your blood tested as directed by your health care provider.  What should I know about cancer screening? There are several types of cancer. Take the following steps to reduce your risk and to catch any cancer development as early as possible. Breast Cancer  Practice breast self-awareness. ? This means understanding how your breasts normally appear and feel. ? It also means doing regular breast self-exams. Let your health care provider know about any changes, no matter how small.  If you are 65 or older, have a clinician do a breast exam (clinical breast exam or CBE) every year. Depending on your age, family history, and medical history, it may be recommended that you also have a yearly breast X-ray (mammogram).  If you have a family history of breast cancer, talk with your health care provider about genetic screening.  If you are at high risk for breast cancer, talk with your health care provider about having an MRI and a mammogram every year.  Breast cancer (BRCA) gene test is recommended for women who have family members with BRCA-related cancers. Results of the assessment will determine the need for genetic counseling and BRCA1 and for BRCA2 testing. BRCA-related cancers include these types: ? Breast. This occurs in males or females. ? Ovarian. ? Tubal. This may also be called fallopian tube cancer. ? Cancer of the abdominal or pelvic lining (peritoneal cancer). ? Prostate. ? Pancreatic.  Cervical, Uterine, and Ovarian Cancer Your health care provider may recommend that you be screened regularly for cancer of the pelvic organs. These include your ovaries, uterus, and vagina. This screening involves a pelvic exam, which includes checking for microscopic changes to the surface of  your cervix (Pap test).  For women ages 21-65, health care providers may recommend a pelvic exam and a Pap test every three years. For women ages 25-65, they may recommend the Pap test and pelvic exam, combined with testing for human papilloma virus (HPV), every five years. Some types of HPV increase your risk of cervical cancer. Testing for HPV may also be done on women of any age who have unclear Pap test results.  Other health care providers may not recommend any screening for nonpregnant women who are considered low risk for pelvic cancer and have no symptoms. Ask your health care provider if a screening pelvic exam is right for you.  If you have had past treatment for cervical cancer or a condition that could lead to cancer, you need Pap tests and screening for cancer for at least 20 years after your treatment. If Pap tests have been discontinued for you, your risk factors (such as having a new sexual partner) need to be reassessed to determine if you should start having screenings again. Some women have medical problems that increase the chance of getting cervical cancer. In these cases, your health care provider may recommend that you have screening and Pap tests more often.  If you have a family history of uterine cancer or ovarian cancer, talk with your health care provider about genetic screening.  If you have vaginal bleeding after reaching menopause, tell your health  care provider.  There are currently no reliable tests available to screen for ovarian cancer.  Lung Cancer Lung cancer screening is recommended for adults 6-77 years old who are at high risk for lung cancer because of a history of smoking. A yearly low-dose CT scan of the lungs is recommended if you:  Currently smoke.  Have a history of at least 30 pack-years of smoking and you currently smoke or have quit within the past 15 years. A pack-year is smoking an average of one pack of cigarettes per day for one year.  Yearly  screening should:  Continue until it has been 15 years since you quit.  Stop if you develop a health problem that would prevent you from having lung cancer treatment.  Colorectal Cancer  This type of cancer can be detected and can often be prevented.  Routine colorectal cancer screening usually begins at age 23 and continues through age 73.  If you have risk factors for colon cancer, your health care provider may recommend that you be screened at an earlier age.  If you have a family history of colorectal cancer, talk with your health care provider about genetic screening.  Your health care provider may also recommend using home test kits to check for hidden blood in your stool.  A small camera at the end of a tube can be used to examine your colon directly (sigmoidoscopy or colonoscopy). This is done to check for the earliest forms of colorectal cancer.  Direct examination of the colon should be repeated every 5-10 years until age 61. However, if early forms of precancerous polyps or small growths are found or if you have a family history or genetic risk for colorectal cancer, you may need to be screened more often.  Skin Cancer  Check your skin from head to toe regularly.  Monitor any moles. Be sure to tell your health care provider: ? About any new moles or changes in moles, especially if there is a change in a mole's shape or color. ? If you have a mole that is larger than the size of a pencil eraser.  If any of your family members has a history of skin cancer, especially at a young age, talk with your health care provider about genetic screening.  Always use sunscreen. Apply sunscreen liberally and repeatedly throughout the day.  Whenever you are outside, protect yourself by wearing long sleeves, pants, a wide-brimmed hat, and sunglasses.  What should I know about osteoporosis? Osteoporosis is a condition in which bone destruction happens more quickly than new bone creation.  After menopause, you may be at an increased risk for osteoporosis. To help prevent osteoporosis or the bone fractures that can happen because of osteoporosis, the following is recommended:  If you are 51-36 years old, get at least 1,000 mg of calcium and at least 600 mg of vitamin D per day.  If you are older than age 23 but younger than age 57, get at least 1,200 mg of calcium and at least 600 mg of vitamin D per day.  If you are older than age 90, get at least 1,200 mg of calcium and at least 800 mg of vitamin D per day.  Smoking and excessive alcohol intake increase the risk of osteoporosis. Eat foods that are rich in calcium and vitamin D, and do weight-bearing exercises several times each week as directed by your health care provider. What should I know about how menopause affects my mental health? Depression may  occur at any age, but it is more common as you become older. Common symptoms of depression include:  Low or sad mood.  Changes in sleep patterns.  Changes in appetite or eating patterns.  Feeling an overall lack of motivation or enjoyment of activities that you previously enjoyed.  Frequent crying spells.  Talk with your health care provider if you think that you are experiencing depression. What should I know about immunizations? It is important that you get and maintain your immunizations. These include:  Tetanus, diphtheria, and pertussis (Tdap) booster vaccine.  Influenza every year before the flu season begins.  Pneumonia vaccine.  Shingles vaccine.  Your health care provider may also recommend other immunizations. This information is not intended to replace advice given to you by your health care provider. Make sure you discuss any questions you have with your health care provider. Document Released: 10/23/2005 Document Revised: 03/20/2016 Document Reviewed: 06/04/2015 Elsevier Interactive Patient Education  2018 [place] for [employer]  Loss Calories are units of energy. Your body needs a certain amount of calories from food to keep you going throughout the day. When you eat more calories than your body needs, your body stores the extra calories as fat. When you eat fewer calories than your body needs, your body burns fat to get the energy it needs. Calorie counting means keeping track of how many calories you eat and drink each day. Calorie counting can be helpful if you need to lose weight. If you make sure to eat fewer calories than your body needs, you should lose weight. Ask your health care provider what a healthy weight is for you. For calorie counting to work, you will need to eat the right number of calories in a day in order to lose a healthy amount of weight per week. A dietitian can help you determine how many calories you need in a day and will give you suggestions on how to reach your calorie goal.  A healthy amount of weight to lose per week is usually 1-2 lb (0.5-0.9 kg). This usually means that your daily calorie intake should be reduced by 500-750 calories.  Eating 1,200 - 1,500 calories per day can help most women lose weight.  Eating 1,500 - 1,800 calories per day can help most men lose weight.  What is my plan? My goal is to have __________ calories per day. If I have this many calories per day, I should lose around __________ pounds per week. What do I need to know about calorie counting? In order to meet your daily calorie goal, you will need to:  Find out how many calories are in each food you would like to eat. Try to do this before you eat.  Decide how much of the food you plan to eat.  Write down what you ate and how many calories it had. Doing this is called keeping a food log.  To successfully lose weight, it is important to balance calorie counting with a healthy lifestyle that includes regular activity. Aim for 150 minutes of moderate exercise (such as walking) or 75 minutes of vigorous exercise  (such as running) each week. Where do I find calorie information?  The number of calories in a food can be found on a Nutrition Facts label. If a food does not have a Nutrition Facts label, try to look up the calories online or ask your dietitian for help. Remember that calories are listed per serving. If you choose to  have more than one serving of a food, you will have to multiply the calories per serving by the amount of servings you plan to eat. For example, the label on a package of bread might say that a serving size is 1 slice and that there are 90 calories in a serving. If you eat 1 slice, you will have eaten 90 calories. If you eat 2 slices, you will have eaten 180 calories. How do I keep a food log? Immediately after each meal, record the following information in your food log:  What you ate. Don't forget to include toppings, sauces, and other extras on the food.  How much you ate. This can be measured in cups, ounces, or number of items.  How many calories each food and drink had.  The total number of calories in the meal.  Keep your food log near you, such as in a small notebook in your pocket, or use a mobile app or website. Some programs will calculate calories for you and show you how many calories you have left for the day to meet your goal. What are some calorie counting tips?  Use your calories on foods and drinks that will fill you up and not leave you hungry: ? Some examples of foods that fill you up are nuts and nut butters, vegetables, lean proteins, and high-fiber foods like whole grains. High-fiber foods are foods with more than 5 g fiber per serving. ? Drinks such as sodas, specialty coffee drinks, alcohol, and juices have a lot of calories, yet do not fill you up.  Eat nutritious foods and avoid empty calories. Empty calories are calories you get from foods or beverages that do not have many vitamins or protein, such as candy, sweets, and soda. It is better to have a  nutritious high-calorie food (such as an avocado) than a food with few nutrients (such as a bag of chips).  Know how many calories are in the foods you eat most often. This will help you calculate calorie counts faster.  Pay attention to calories in drinks. Low-calorie drinks include water and unsweetened drinks.  Pay attention to nutrition labels for "low fat" or "fat free" foods. These foods sometimes have the same amount of calories or more calories than the full fat versions. They also often have added sugar, starch, or salt, to make up for flavor that was removed with the fat.  Find a way of tracking calories that works for you. Get creative. Try different apps or programs if writing down calories does not work for you. What are some portion control tips?  Know how many calories are in a serving. This will help you know how many servings of a certain food you can have.  Use a measuring cup to measure serving sizes. You could also try weighing out portions on a kitchen scale. With time, you will be able to estimate serving sizes for some foods.  Take some time to put servings of different foods on your favorite plates, bowls, and cups so you know what a serving looks like.  Try not to eat straight from a bag or box. Doing this can lead to overeating. Put the amount you would like to eat in a cup or on a plate to make sure you are eating the right portion.  Use smaller plates, glasses, and bowls to prevent overeating.  Try not to multitask (for example, watch TV or use your computer) while eating. If it is time to  eat, sit down at a table and enjoy your food. This will help you to know when you are full. It will also help you to be aware of what you are eating and how much you are eating. What are tips for following this plan? Reading food labels  Check the calorie count compared to the serving size. The serving size may be smaller than what you are used to eating.  Check the source of  the calories. Make sure the food you are eating is high in vitamins and protein and low in saturated and trans fats. Shopping  Read nutrition labels while you shop. This will help you make healthy decisions before you decide to purchase your food.  Make a grocery list and stick to it. Cooking  Try to cook your favorite foods in a healthier way. For example, try baking instead of frying.  Use low-fat dairy products. Meal planning  Use more fruits and vegetables. Half of your plate should be fruits and vegetables.  Include lean proteins like poultry and fish. How do I count calories when eating out?  Ask for smaller portion sizes.  Consider sharing an entree and sides instead of getting your own entree.  If you get your own entree, eat only half. Ask for a box at the beginning of your meal and put the rest of your entree in it so you are not tempted to eat it.  If calories are listed on the menu, choose the lower calorie options.  Choose dishes that include vegetables, fruits, whole grains, low-fat dairy products, and lean protein.  Choose items that are boiled, broiled, grilled, or steamed. Stay away from items that are buttered, battered, fried, or served with cream sauce. Items labeled "crispy" are usually fried, unless stated otherwise.  Choose water, low-fat milk, unsweetened iced tea, or other drinks without added sugar. If you want an alcoholic beverage, choose a lower calorie option such as a glass of wine or light beer.  Ask for dressings, sauces, and syrups on the side. These are usually high in calories, so you should limit the amount you eat.  If you want a salad, choose a garden salad and ask for grilled meats. Avoid extra toppings like bacon, cheese, or fried items. Ask for the dressing on the side, or ask for olive oil and vinegar or lemon to use as dressing.  Estimate how many servings of a food you are given. For example, a serving of cooked rice is  cup or about  the size of half a baseball. Knowing serving sizes will help you be aware of how much food you are eating at restaurants. The list below tells you how big or small some common portion sizes are based on everyday objects: ? 1 oz-4 stacked dice. ? 3 oz-1 deck of cards. ? 1 tsp-1 die. ? 1 Tbsp- a ping-pong ball. ? 2 Tbsp-1 ping-pong ball. ?  cup- baseball. ? 1 cup-1 baseball. Summary  Calorie counting means keeping track of how many calories you eat and drink each day. If you eat fewer calories than your body needs, you should lose weight.  A healthy amount of weight to lose per week is usually 1-2 lb (0.5-0.9 kg). This usually means reducing your daily calorie intake by 500-750 calories.  The number of calories in a food can be found on a Nutrition Facts label. If a food does not have a Nutrition Facts label, try to look up the calories online or ask your dietitian  for help.  Use your calories on foods and drinks that will fill you up, and not on foods and drinks that will leave you hungry.  Use smaller plates, glasses, and bowls to prevent overeating. This information is not intended to replace advice given to you by your health care provider. Make sure you discuss any questions you have with your health care provider. Document Released: 08/31/2005 Document Revised: 07/31/2016 Document Reviewed: 07/31/2016 Elsevier Interactive Patient Education  2017 [employer].

## 2017-09-01 ENCOUNTER — Telehealth: Payer: Self-pay | Admitting: Internal Medicine

## 2017-09-01 NOTE — Telephone Encounter (Signed)
Copied from [place] [phone]. Topic: Quick Communication - See Telephone Encounter >> Sep 01, 2017  5:19 PM Ivar Drape wrote: CRM for notification. See Telephone encounter for:  09/01/17. Patient said she was on Migrane medication a year ago but she cannot remember the name of the medication.  She said it's not on her present medication list but she is experiencing a bad migrane headache and she wants to know if that medication can be called into her pharmacy, [facility] [address]. Please advise.

## 2017-09-02 ENCOUNTER — Telehealth: Payer: Self-pay | Admitting: Internal Medicine

## 2017-09-02 ENCOUNTER — Telehealth: Payer: Self-pay

## 2017-09-02 ENCOUNTER — Emergency Department (HOSPITAL_COMMUNITY)
Admission: EM | Admit: 2017-09-02 | Discharge: 2017-09-02 | Discharge status: Against Medical Advice | Payer: Medicare Other

## 2017-09-02 ENCOUNTER — Other Ambulatory Visit: Payer: Self-pay | Admitting: Internal Medicine

## 2017-09-02 DIAGNOSIS — G43009 Migraine without aura, not intractable, without status migrainosus: Secondary | ICD-10-CM

## 2017-09-02 MED ORDER — SUMATRIPTAN-NAPROXEN SODIUM 85-500 MG PO TABS: 1.0000 | ORAL_TABLET | ORAL | 1 refills | Status: DC | PRN

## 2017-09-02 MED ORDER — SUMATRIPTAN SUCCINATE 100 MG PO TABS: 100.0000 mg | ORAL_TABLET | ORAL | 3 refills | Status: DC | PRN

## 2017-09-02 NOTE — Telephone Encounter (Signed)
RX sent

## 2017-09-02 NOTE — Telephone Encounter (Signed)
Copied from [place] [phone]. Topic: Quick Communication - See Telephone Encounter >> Sep 02, 2017 10:00 AM Hewitt Shorts wrote: CRM for notification. See Telephone encounter for:  Pt is wanting to let the provider know that the pharmacy does not have the medication on file for the migraines but needs a refill [facility] [address] 09/02/17 Beset number [phone]

## 2017-09-02 NOTE — Telephone Encounter (Signed)
Copied from [place] [phone]. Topic: General - Other >> Sep 02, 2017  9:40 AM Valerie Jackson, RMA wrote: Reason for CRM: Pt called and stated that she has a migraine and that she is out of her sumatriptan 85-500 mg and would like a refill sent to her pharmacy [facility] on [address]

## 2017-09-02 NOTE — Telephone Encounter (Signed)
Duplicate msg see previous MSG MD sent in [facility]...Valerie Jackson

## 2017-09-02 NOTE — Telephone Encounter (Signed)
Notified pt MD sent rx to [facility] 

## 2017-09-02 NOTE — Telephone Encounter (Signed)
Copied from [place] [id number]. Topic: General - Other >> Sep 02, 2017  9:40 AM Lolita Rieger, RMA wrote: Reason for CRM: Pt called and stated that she has a migraine and that she is out of her sumatriptan 85-500 mg and would like a refill sent to her pharmacy [facility] [address]

## 2017-09-02 NOTE — ED Notes (Signed)
Called pt's name to be triaged, but no response from the waiting room.

## 2017-09-02 NOTE — Telephone Encounter (Signed)
erx sent

## 2017-09-16 ENCOUNTER — Other Ambulatory Visit: Payer: Self-pay | Admitting: Gynecology

## 2017-09-16 DIAGNOSIS — H25812 Combined forms of age-related cataract, left eye: Secondary | ICD-10-CM | POA: Diagnosis not present

## 2017-09-16 DIAGNOSIS — H2512 Age-related nuclear cataract, left eye: Secondary | ICD-10-CM | POA: Diagnosis not present

## 2017-09-16 DIAGNOSIS — Z780 Asymptomatic menopausal state: Secondary | ICD-10-CM

## 2017-09-17 ENCOUNTER — Telehealth: Payer: Self-pay | Admitting: Pulmonary Disease

## 2017-09-17 MED ORDER — FLUTICASONE FUROATE 100 MCG/ACT IN AEPB: 1.0000 | INHALATION_SPRAY | Freq: Every day | RESPIRATORY_TRACT | 5 refills | Status: DC

## 2017-09-17 NOTE — Telephone Encounter (Signed)
Spoke with pt's daughter, York Cerise. States that pt needs a refill on [facility]. Rx has been sent in. We will wait and see if this needs a PA. Nothing further was needed.

## 2017-09-21 ENCOUNTER — Other Ambulatory Visit: Payer: Self-pay | Admitting: Internal Medicine

## 2017-09-21 DIAGNOSIS — E890 Postprocedural hypothyroidism: Secondary | ICD-10-CM

## 2017-09-27 ENCOUNTER — Ambulatory Visit (INDEPENDENT_AMBULATORY_CARE_PROVIDER_SITE_OTHER): Payer: Medicare Other | Admitting: Internal Medicine

## 2017-09-27 ENCOUNTER — Encounter: Payer: Self-pay | Admitting: Internal Medicine

## 2017-09-27 VITALS — BP 120/72 | HR 66 | Temp 97.7°F | Resp 16 | Ht 65.0 in | Wt 209.0 lb

## 2017-09-27 DIAGNOSIS — G43009 Migraine without aura, not intractable, without status migrainosus: Secondary | ICD-10-CM | POA: Diagnosis not present

## 2017-09-27 MED ORDER — SUMATRIPTAN SUCCINATE 4 MG/0.5ML ~~LOC~~ SOAJ: 1.0000 | Freq: Two times a day (BID) | SUBCUTANEOUS | 11 refills | Status: DC | PRN

## 2017-09-27 MED ORDER — SUMATRIPTAN SUCCINATE 4 MG/0.5ML ~~LOC~~ SOAJ: 1.0000 | Freq: Two times a day (BID) | SUBCUTANEOUS | 3 refills | Status: DC | PRN

## 2017-09-27 NOTE — Progress Notes (Signed)
Subjective:  Patient ID: Valerie Jackson, female    DOB: Feb 14, 1946  Age: 72 y.o. MRN: [id number]  CC: Headache   HPI [name] presents for concerns about migraine headaches.  She says about 3 weeks ago she had a migraine headache that lasted 3 days.  She had a stabbing, pounding sensation in her left forehead.  This is consistent with her prior history of migraines.  She tells me she did not take Imitrex because it was too expensive claiming that it would cause $400.  The headache resolved and has not returned.  She denies changes in her vision or hearing, numbness, weakness, or tingling She denies difficulty with her speech or gait.  Only has a headache about once or twice a month.  Outpatient Medications Prior to Visit  Medication Sig Dispense Refill  . acetaminophen (TYLENOL) 500 MG tablet Take 1,000 mg by mouth every 6 (six) hours as needed for headache.    . calcium carbonate (OS-CAL - DOSED IN MG OF ELEMENTAL CALCIUM) 1250 (500 CA) MG tablet Take 1 tablet by mouth daily as needed. Reported on 04/03/2016    . chlorthalidone (HYGROTON) 25 MG tablet TAKE 1/2 TABLET EVERY DAY 45 tablet 1  . cholecalciferol (VITAMIN D) 1000 UNITS tablet Take 2,000 Units by mouth daily.     . clobetasol cream (TEMOVATE) 0.86 % Apply 1 application topically 2 (two) times daily. 60 g 2  . Fluticasone Furoate (ARNUITY ELLIPTA) 100 MCG/ACT AEPB Inhale 1 puff into the lungs daily. 30 each 5  . hydrochlorothiazide (MICROZIDE) 12.5 MG capsule Take 12.5 mg by mouth daily.    [name] levothyroxine (SYNTHROID, LEVOTHROID) 112 MCG tablet TAKE 1 TABLET EVERY DAY 90 tablet 0  . PROLENSA 0.07 % SOLN PLACE 1 DROP INTO LEFT EYE EVERY DAY BEGIN 2 DAYS BEFORE SURGERY  0  . zolpidem (AMBIEN) 5 MG tablet Take 1 tablet (5 mg total) by mouth at bedtime as needed for sleep. 30 tablet 5  . moxifloxacin (VIGAMOX) 0.5 % ophthalmic solution PLACE 1 DROP 4 TIMES A DAY INTO LEFT EYE START 2 DAYS PRIOR TO SURGERY& 2 DROPS MORNING OF  SURGERY  0  . prednisoLONE acetate (PRED FORTE) 1 % ophthalmic suspension PLACE 1 DROP INTO LEFT EYE 4 TIMES A DAY BEGIN AFTER SURGERY  0  . SUMAtriptan (IMITREX) 100 MG tablet Take 1 tablet (100 mg total) by mouth every 2 (two) hours as needed for migraine. May repeat in 2 hours if headache persists or recurs. 10 tablet 3  . SUMAtriptan-naproxen (TREXIMET) 85-500 MG tablet Take 1 tablet by mouth every 2 (two) hours as needed for migraine. (Patient not taking: Reported on 09/27/2017) 10 tablet 1   No facility-administered medications prior to visit.     ROS Review of Systems  Constitutional: Negative for diaphoresis and fatigue.  HENT: Negative.   Eyes: Negative for photophobia and visual disturbance.  Respiratory: Negative for cough, chest tightness and shortness of breath.   Cardiovascular: Negative.  Negative for chest pain, palpitations and leg swelling.  Gastrointestinal: Negative for abdominal pain, diarrhea, nausea and vomiting.  Endocrine: Negative.   Genitourinary: Negative.  Negative for difficulty urinating.  Musculoskeletal: Negative.  Negative for back pain and neck pain.  Skin: Negative.   Neurological: Negative.  Negative for dizziness, weakness and headaches.  Hematological: Negative for adenopathy. Does not bruise/bleed easily.  Psychiatric/Behavioral: Negative.     Objective:  BP 120/72 (BP Location: Left Arm, Patient Position: Sitting, Cuff Size: Large)  Pulse 66   Temp 97.7 F (36.5 C) (Oral)   Resp 16   Ht 5' 5"  (1.651 m)   Wt 209 lb (94.8 kg)   SpO2 98%   BMI 34.78 kg/m   BP Readings from Last 3 Encounters:  09/27/17 120/72  08/27/17 122/80  08/17/17 132/80    Wt Readings from Last 3 Encounters:  09/27/17 209 lb (94.8 kg)  08/27/17 205 lb (93 kg)  08/17/17 208 lb 6.4 oz (94.5 kg)    Physical Exam  Constitutional: She is oriented to person, place, and time. No distress.  HENT:  Mouth/Throat: No oropharyngeal exudate.  Eyes: Conjunctivae and  EOM are normal. Pupils are equal, round, and reactive to light. Right eye exhibits no discharge. Left eye exhibits no discharge. No scleral icterus.  Neck: Normal range of motion. Neck supple. No JVD present.  Cardiovascular: Normal rate, regular rhythm and normal heart sounds. Exam reveals no gallop.  No murmur heard. Pulmonary/Chest: Effort normal and breath sounds normal. No respiratory distress. She has no wheezes. She has no rales.  Abdominal: Soft. Bowel sounds are normal. There is no tenderness. There is no rebound and no guarding.  Musculoskeletal: Normal range of motion. She exhibits no edema or tenderness.  Neurological: She is alert and oriented to person, place, and time. She has normal reflexes. She displays normal reflexes. No cranial nerve deficit. She exhibits normal muscle tone. Coordination normal.  Skin: Skin is warm and dry. No rash noted. She is not diaphoretic. No erythema. No pallor.  Vitals reviewed.   Lab Results  Component Value Date   WBC 7.1 06/24/2017   HGB 12.8 06/24/2017   HCT 37.9 06/24/2017   PLT 198 06/24/2017   GLUCOSE 101 (H) 06/24/2017   CHOL 229 (H) 10/05/2016   TRIG 166.0 (H) 10/05/2016   HDL 50.60 10/05/2016   LDLDIRECT 226.0 02/27/2013   LDLCALC 145 (H) 10/05/2016   ALT 18 03/30/2017   AST 14 03/30/2017   NA 139 06/24/2017   K 3.9 06/24/2017   CL 107 06/24/2017   CREATININE 1.42 (H) 06/24/2017   BUN 14 06/24/2017   CO2 24 06/24/2017   TSH 1.11 03/30/2017   INR 1.02 06/24/2017   HGBA1C 4.7 05/14/2014    No results found.  Assessment & Plan:   [name] was seen today for headache.  Diagnoses and all orders for this visit:  Migraine without aura and without status migrainosus, not intractable- It appears the lowest co-pay on her insurance is injectable sumatriptan. I have asked her to give this a try.  I do not think her headache frequency requires preventive therapy at this time. -     Discontinue: SUMAtriptan Succinate 4 MG/0.5ML  SOAJ; Inject 1 Act into the skin 2 (two) times daily between meals as needed. -     SUMAtriptan Succinate 4 MG/0.5ML SOAJ; Inject 1 Act into the skin 2 (two) times daily between meals as needed.   I have discontinued [name] R. Lemieux's SUMAtriptan, SUMAtriptan-naproxen, moxifloxacin, and prednisoLONE acetate. I am also having her maintain her zolpidem, cholecalciferol, calcium carbonate, acetaminophen, clobetasol cream, hydrochlorothiazide, Fluticasone Furoate, levothyroxine, chlorthalidone, PROLENSA, and SUMAtriptan Succinate.  Meds ordered this encounter  Medications  . DISCONTD: SUMAtriptan Succinate 4 MG/0.5ML SOAJ    Sig: Inject 1 Act into the skin 2 (two) times daily between meals as needed.    Dispense:  6 Cartridge    Refill:  11  . SUMAtriptan Succinate 4 MG/0.5ML SOAJ    Sig: Inject 1 Act  into the skin 2 (two) times daily between meals as needed.    Dispense:  18 Cartridge    Refill:  3     Follow-up: Return in about 3 months (around 12/26/2017).  [name], MD

## 2017-09-27 NOTE — Patient Instructions (Signed)
Migraine Headache A migraine headache is an intense, throbbing pain on one side or both sides of the head. Migraines may also cause other symptoms, such as nausea, vomiting, and sensitivity to light and noise. What are the causes? Doing or taking certain things may also trigger migraines, such as:  Alcohol.  Smoking.  Medicines, such as: ? Medicine used to treat chest pain (nitroglycerine). ? Birth control pills. ? Estrogen pills. ? Certain blood pressure medicines.  Aged cheeses, chocolate, or caffeine.  Foods or drinks that contain nitrates, glutamate, aspartame, or tyramine.  Physical activity.  Other things that may trigger a migraine include:  Menstruation.  Pregnancy.  Hunger.  Stress, lack of sleep, too much sleep, or fatigue.  Weather changes.  What increases the risk? The following factors may make you more likely to experience migraine headaches:  Age. Risk increases with age.  Family history of migraine headaches.  Being Caucasian.  Depression and anxiety.  Obesity.  Being a woman.  Having a hole in the heart (patent foramen ovale) or other heart problems.  What are the signs or symptoms? The main symptom of this condition is pulsating or throbbing pain. Pain may:  Happen in any area of the head, such as on one side or both sides.  Interfere with daily activities.  Get worse with physical activity.  Get worse with exposure to bright lights or loud noises.  Other symptoms may include:  Nausea.  Vomiting.  Dizziness.  General sensitivity to bright lights, loud noises, or smells.  Before you get a migraine, you may get warning signs that a migraine is developing (aura). An aura may include:  Seeing flashing lights or having blind spots.  Seeing bright spots, halos, or zigzag lines.  Having tunnel vision or blurred vision.  Having numbness or a tingling feeling.  Having trouble talking.  Having muscle weakness.  How is this  diagnosed? A migraine headache can be diagnosed based on:  Your symptoms.  A physical exam.  Tests, such as CT scan or MRI of the head. These imaging tests can help rule out other causes of headaches.  Taking fluid from the spine (lumbar puncture) and analyzing it (cerebrospinal fluid analysis, or CSF analysis).  How is this treated? A migraine headache is usually treated with medicines that:  Relieve pain.  Relieve nausea.  Prevent migraines from coming back.  Treatment may also include:  Acupuncture.  Lifestyle changes like avoiding foods that trigger migraines.  Follow these instructions at home: Medicines  Take over-the-counter and prescription medicines only as told by your health care provider.  Do not drive or use heavy machinery while taking prescription pain medicine.  To prevent or treat constipation while you are taking prescription pain medicine, your health care provider may recommend that you: ? Drink enough fluid to keep your urine clear or pale yellow. ? Take over-the-counter or prescription medicines. ? Eat foods that are high in fiber, such as fresh fruits and vegetables, whole grains, and beans. ? Limit foods that are high in fat and processed sugars, such as fried and sweet foods. Lifestyle  Avoid alcohol use.  Do not use any products that contain nicotine or tobacco, such as cigarettes and e-cigarettes. If you need help quitting, ask your health care provider.  Get at least 8 hours of sleep every night.  Limit your stress. General instructions   Keep a journal to find out what may trigger your migraine headaches. For example, write down: ? What you eat and   drink. ? How much sleep you get. ? Any change to your diet or medicines.  If you have a migraine: ? Avoid things that make your symptoms worse, such as bright lights. ? It may help to lie down in a dark, quiet room. ? Do not drive or use heavy machinery. ? Ask your health care provider  what activities are safe for you while you are experiencing symptoms.  Keep all follow-up visits as told by your health care provider. This is important. Contact a health care provider if:  You develop symptoms that are different or more severe than your usual migraine symptoms. Get help right away if:  Your migraine becomes severe.  You have a fever.  You have a stiff neck.  You have vision loss.  Your muscles feel weak or like you cannot control them.  You start to lose your balance often.  You develop trouble walking.  You faint. This information is not intended to replace advice given to you by your health care provider. Make sure you discuss any questions you have with your health care provider. Document Released: 08/31/2005 Document Revised: 03/20/2016 Document Reviewed: 02/17/2016 Elsevier Interactive Patient Education  2017 [employer]   

## 2017-09-28 ENCOUNTER — Ambulatory Visit: Payer: Medicare Other

## 2017-09-30 ENCOUNTER — Ambulatory Visit (INDEPENDENT_AMBULATORY_CARE_PROVIDER_SITE_OTHER): Payer: Medicare Other

## 2017-09-30 DIAGNOSIS — Z780 Asymptomatic menopausal state: Secondary | ICD-10-CM | POA: Diagnosis not present

## 2017-10-01 ENCOUNTER — Encounter: Payer: Self-pay | Admitting: Gynecology

## 2017-10-21 DIAGNOSIS — H2511 Age-related nuclear cataract, right eye: Secondary | ICD-10-CM | POA: Diagnosis not present

## 2017-10-21 DIAGNOSIS — H25811 Combined forms of age-related cataract, right eye: Secondary | ICD-10-CM | POA: Diagnosis not present

## 2017-10-27 ENCOUNTER — Telehealth: Payer: Self-pay

## 2017-10-27 ENCOUNTER — Other Ambulatory Visit: Payer: Self-pay | Admitting: Internal Medicine

## 2017-10-27 DIAGNOSIS — G43009 Migraine without aura, not intractable, without status migrainosus: Secondary | ICD-10-CM

## 2017-10-27 MED ORDER — RIZATRIPTAN BENZOATE 10 MG PO TBDP: 10.0000 mg | ORAL_TABLET | ORAL | 1 refills | Status: DC | PRN

## 2017-10-27 NOTE — Telephone Encounter (Signed)
Received fax from [facility]. There is a cost alternative to sumatriptan solution.  Alternatives are: rizatriptan and sumatriptan tablet.   Please advise either way. Will contact [employer] regarding determination.

## 2017-10-27 NOTE — Telephone Encounter (Signed)
Reviewed chart and PCP has sent in rizatriptan.

## 2017-11-02 NOTE — Progress Notes (Addendum)
Subjective:   Valerie Jackson is a 72 y.o. female who presents for Medicare Annual (Subsequent) preventive examination.  Patient states she is experiencing a lot of stress and anxiety. The living situation at home with her daughter and grandson is difficult. Patient requested anti-anxiety medication to help decrease her symptoms.  Review of Systems:  No ROS.  Medicare Wellness Visit. Additional risk factors are reflected in the social history.  Cardiac Risk Factors include: advanced age (>77mn, >>73women);dyslipidemia;hypertension;obesity (BMI >30kg/m2);sedentary lifestyle Sleep patterns: has difficulty falling asleep, has restless sleep, gets up 1 times nightly to void and sleeps 4-5 hours nightly. Patient reports insomnia issues, discussed recommended sleep tips. Patient declined printout sleep tip education.  Home Safety/Smoke Alarms: Feels safe in home. Smoke alarms in place.  Living environment; residence and Firearm Safety: 1-story house/ trailer, no firearms. Lives with family, no needs for DME, good support system Seat Belt Safety/Bike Helmet: Wears seat belt.     Objective:     Vitals: BP 122/70   Pulse 72   Resp 18   Ht 5' 5"  (1.651 m)   Wt 212 lb (96.2 kg)   SpO2 98%   BMI 35.28 kg/m   Body mass index is 35.28 kg/m.  Advanced Directives 11/03/2017 06/21/2017 10/15/2016 03/04/2016 03/03/2016 08/29/2015 07/17/2015  Does Patient Have a Medical Advance Directive? No No No No No No No  Does patient want to make changes to medical advance directive? - - - No - Patient declined No - Patient declined - -  Copy of [place]? - - - No - copy requested No - copy requested - -  Would patient like information on creating a medical advance directive? No - Patient declined No - Patient declined No - Patient declined No - patient declined information No - patient declined information No - patient declined information -  Pre-existing out of facility DNR order  (yellow form or pink MOST form) - - - - - - -    Tobacco Social History   Tobacco Use  Smoking Status Former Smoker  . Packs/day: 0.50  . Years: 52.00  . Pack years: 26.00  . Types: Cigarettes  . Last attempt to quit: 09/06/2012  . Years since quitting: 5.1  Smokeless Tobacco Never Used  Tobacco Comment   never smoked a full PPD.      Counseling given: Not Answered Comment: never smoked a full PPD.   Past Medical History:  Diagnosis Date  . Arthritis    fingers  . Blood clot in vein 1968  . Cataracts, bilateral   . CHF (congestive heart failure) ([place]   . Chronic kidney disease    CKD stage 2  . COPD (chronic obstructive pulmonary disease) ([facility]   . Depression   . Diverticulitis 09/2013   with perforation  . Dizziness    with decreased BP readings or bending over  . Floaters   . GERD (gastroesophageal reflux disease)   . H/O hiatal hernia   . Headache(784.0)    migraine every 5- to 6 years  . Heart murmur since age 72  mild  . Hepatitis age [age]or 89  had heapatitis, not sure which kind  . History of blood transfusion 1965  . History of frequent urinary tract infections   . Hypercholesteremia   . Hypertension   . Hypothyroid   . Obesity   . Pericolonic abscess 2015  . Pneumonia   . Sickle cell trait ([facility]  Past Surgical History:  Procedure Laterality Date  . CHOLECYSTECTOMY  2002  . COLON RESECTION N/A 10/11/2013   Procedure: EXPLORATORY LAPAROTOMY WITH RECTOSIGMOID COLECTOMY AND END COLOSTOMY;  Surgeon: Stark Klein, MD;  Location: [facility];  Service: General;  Laterality: N/A;  . COLOSTOMY TAKEDOWN N/A 02/27/2014   Procedure: COLOSTOMY TAKEDOWN [name] HERNIA REPAIR;  Surgeon: Stark Klein, MD;  Location: [facility];  Service: General;  Laterality: N/A;  . INCISIONAL HERNIA REPAIR N/A 03/04/2016   Procedure: LAPAROSCOPIC INCISIONAL HERNIA WITH MESH;  Surgeon: Ralene Ok, MD;  Location: [facility];  Service: General;  Laterality: N/A;  . LAPAROSCOPIC  LYSIS OF ADHESIONS N/A 03/04/2016   Procedure: LAPAROSCOPIC LYSIS OF ADHESIONS;  Surgeon: Ralene Ok, MD;  Location: [facility];  Service: General;  Laterality: N/A;  . RIGHT HEART CATH N/A 06/24/2017   Procedure: RIGHT HEART CATH;  Surgeon: Jolaine Artist, MD;  Location: [facility];  Service: Cardiovascular;  Laterality: N/A;  . TONSILLECTOMY  1952   Family History  Problem Relation Age of Onset  . Hypertension Mother   . Aneurysm Mother        Cerebral (died from this)  . Cancer Mother        Uterine  . Cancer Other   . Arthritis Sister   . Hyperlipidemia Sister   . Hypertension Sister   . Stroke Sister   . Kidney disease Sister        Cancer  . Hyperlipidemia Brother   . Glaucoma Brother   . Thyroid cancer Daughter   . Breast cancer Maternal Grandmother   . Hypertension Maternal Grandmother   . Hypercalcemia Neg Hx    Social History   Socioeconomic History  . Marital status: Divorced    Spouse name: None  . Number of children: 1  . Years of education: 62  . Highest education level: None  Social Needs  . Financial resource strain: Not very hard  . Food insecurity - worry: Never true  . Food insecurity - inability: Never true  . Transportation needs - medical: No  . Transportation needs - non-medical: No  Occupational History  . Occupation: Retired    Comment: Therapist, sports  Tobacco Use  . Smoking status: Former Smoker    Packs/day: 0.50    Years: 52.00    Pack years: 26.00    Types: Cigarettes    Last attempt to quit: 09/06/2012    Years since quitting: 5.1  . Smokeless tobacco: Never Used  . Tobacco comment: never smoked a full PPD.   Substance and Sexual Activity  . Alcohol use: No    Alcohol/week: 0.0 oz    Comment: very rarely  . Drug use: No  . Sexual activity: Not Currently  Other Topics Concern  . None  Social History Narrative   Regular exercise-no   Caffeine Use-yes    Outpatient Encounter Medications as of 11/03/2017  Medication Sig  .  acetaminophen (TYLENOL) 500 MG tablet Take 1,000 mg by mouth every 6 (six) hours as needed for headache.  [name] amoxicillin (AMOXIL) 500 MG capsule Take 500 mg by mouth 3 (three) times daily.  . calcium carbonate (OS-CAL - DOSED IN MG OF ELEMENTAL CALCIUM) 1250 (500 CA) MG tablet Take 1 tablet by mouth daily as needed. Reported on 04/03/2016  . chlorthalidone (HYGROTON) 25 MG tablet TAKE 1/2 TABLET EVERY DAY (Patient taking differently: takes as needed)  . cholecalciferol (VITAMIN D) 1000 UNITS tablet Take 2,000 Units by mouth daily.   [name]  clobetasol cream (TEMOVATE) 7.67 % Apply 1 application topically 2 (two) times daily.  . Fluticasone Furoate (ARNUITY ELLIPTA) 100 MCG/ACT AEPB Inhale 1 puff into the lungs daily.  . hydrochlorothiazide (MICROZIDE) 12.5 MG capsule Take 12.5 mg by mouth daily.   [name] levothyroxine (SYNTHROID, LEVOTHROID) 112 MCG tablet TAKE 1 TABLET EVERY DAY  . PROLENSA 0.07 % SOLN PLACE 1 DROP INTO LEFT EYE EVERY DAY BEGIN 2 DAYS BEFORE SURGERY  . rizatriptan (MAXALT-MLT) 10 MG disintegrating tablet Take 1 tablet (10 mg total) by mouth as needed for migraine. May repeat in 2 hours if needed  . zolpidem (AMBIEN) 5 MG tablet Take 1 tablet (5 mg total) by mouth at bedtime as needed for sleep.   No facility-administered encounter medications on file as of 11/03/2017.     Activities of Daily Living In your present state of health, do you have any difficulty performing the following activities: 11/03/2017 06/24/2017  Hearing? N N  Vision? N N  Difficulty concentrating or making decisions? N N  Walking or climbing stairs? N Y  Comment - Dyspnea  Dressing or bathing? N N  Doing errands, shopping? N -  Preparing Food and eating ? N -  Using the Toilet? N -  In the past six months, have you accidently leaked urine? N -  Do you have problems with loss of bowel control? N -  Managing your Medications? N -  Managing your Finances? N -  Housekeeping or managing your Housekeeping? N -  Some  recent data might be hidden    Patient Care Team: Janith Lima, MD as PCP - General (Internal Medicine) Renato Shin, MD as Consulting Physician (Endocrinology) Terrance Mass, MD as Consulting Physician (Gynecology) Irene Shipper, MD as Consulting Physician (Gastroenterology) Princess Bruins, MD as Consulting Physician (Obstetrics and Gynecology)    Assessment:   This is a routine wellness examination for [name]. Physical assessment deferred to PCP.   Exercise Activities and Dietary recommendations Current Exercise Habits: The patient does not participate in regular exercise at present Diet (meal preparation, eat out, water intake, caffeinated beverages, dairy products, fruits and vegetables): in general, a "healthy" diet     Reviewed heart healthy diet, encouraged patient to increase daily water intake.  Goals      Patient Stated   . patient (pt-stated)     More stress relief and balance; Getting out and meeting people and enjoying art;      Other   . Patient Stated     Maintain current health status.    . Weight (lb) < 160 lb (72.6 kg)     Patient states she would like to lose 40 pounds.        Fall Risk Fall Risk  11/03/2017 11/03/2017 08/19/2017 08/20/2016 07/17/2015  Falls in the past year? No No No No No  Comment - - Emmi Telephone Survey: data to providers prior to load Emmi Telephone Survey: data to providers prior to load -    Depression Screen PHQ 2/9 Scores 11/03/2017 11/03/2017 07/17/2015 05/23/2015  PHQ - 2 Score 4 1 2 2   PHQ- 9 Score 10 - 4 -     Cognitive Function       Ad8 score reviewed for issues:  Issues making decisions: no  Less interest in hobbies / activities: no  Repeats questions, stories (family complaining): no  Trouble using ordinary gadgets (microwave, computer, phone):no  Forgets the month or year: no  Mismanaging finances: no  Remembering appts: no  Daily problems with thinking and/or memory: no Ad8 score is=  0  Immunization History  Administered Date(s) Administered  . Influenza, High Dose Seasonal PF 07/19/2013  . Pneumococcal Conjugate-13 07/19/2013  . Pneumococcal Polysaccharide-23 09/25/2015  . Td 09/15/2011   Screening Tests Health Maintenance  Topic Date Due  . MAMMOGRAM  08/19/2017  . INFLUENZA VACCINE  12/13/2017 (Originally 04/14/2017)  . TETANUS/TDAP  09/14/2021  . COLONOSCOPY  02/09/2024  . DEXA SCAN  Completed  . Hepatitis C Screening  Completed  . PNA vac Low Risk Adult  Completed      Plan:     Nurse will notify PCP regarding patient's increased anxiety, stress, and request for anti-anxiety medication. Nurse did give patient counseling resources.   Continue doing brain stimulating activities (puzzles, reading, adult coloring books, staying active) to keep memory sharp.   Continue to eat heart healthy diet (full of fruits, vegetables, whole grains, lean protein, water--limit salt, fat, and sugar intake) and increase physical activity as tolerated.  I have personally reviewed and noted the following in the patient's chart:   . Medical and social history . Use of alcohol, tobacco or illicit drugs  . Current medications and supplements . Functional ability and status . Nutritional status . Physical activity . Advanced directives . List of other physicians . Vitals . Screenings to include cognitive, depression, and falls . Referrals and appointments  In addition, I have reviewed and discussed with patient certain preventive protocols, quality metrics, and best practice recommendations. A written personalized care plan for preventive services as well as general preventive health recommendations were provided to patient.     Michiel Cowboy, RN  11/03/2017  Medical screening examination/treatment/procedure(s) were performed by non-physician practitioner and as supervising physician I was immediately available for consultation/collaboration. I agree with above. [name], MD

## 2017-11-03 ENCOUNTER — Ambulatory Visit (INDEPENDENT_AMBULATORY_CARE_PROVIDER_SITE_OTHER): Payer: Medicare Other | Admitting: *Deleted

## 2017-11-03 ENCOUNTER — Other Ambulatory Visit: Payer: Self-pay | Admitting: Internal Medicine

## 2017-11-03 ENCOUNTER — Telehealth: Payer: Self-pay | Admitting: *Deleted

## 2017-11-03 VITALS — BP 122/70 | HR 72 | Resp 18 | Ht 65.0 in | Wt 212.0 lb

## 2017-11-03 DIAGNOSIS — Z0000 Encounter for general adult medical examination without abnormal findings: Secondary | ICD-10-CM | POA: Diagnosis not present

## 2017-11-03 DIAGNOSIS — Z1231 Encounter for screening mammogram for malignant neoplasm of breast: Secondary | ICD-10-CM | POA: Diagnosis not present

## 2017-11-03 DIAGNOSIS — F418 Other specified anxiety disorders: Secondary | ICD-10-CM

## 2017-11-03 MED ORDER — ESCITALOPRAM OXALATE 10 MG PO TABS: 10.0000 mg | ORAL_TABLET | Freq: Every day | ORAL | 1 refills | Status: DC

## 2017-11-03 MED ORDER — ALPRAZOLAM 0.5 MG PO TABS: 0.5000 mg | ORAL_TABLET | Freq: Every evening | ORAL | 1 refills | Status: DC | PRN

## 2017-11-03 NOTE — Telephone Encounter (Signed)
RX's sent for xanax and lexapro

## 2017-11-03 NOTE — Patient Instructions (Addendum)
[email or web address] or down load app on smart phone  Aunt Berenice Primas website lists multiple social resources for individuals such as: food, health, money, house hold goods, transit, medical supplies, job training and legal services.  Continue doing brain stimulating activities (puzzles, reading, adult coloring books, staying active) to keep memory sharp.   Continue to eat heart healthy diet (full of fruits, vegetables, whole grains, lean protein, water--limit salt, fat, and sugar intake) and increase physical activity as tolerated.  Valerie Jackson , Thank you for taking time to come for your Medicare Wellness Visit. I appreciate your ongoing commitment to your health goals. Please review the following plan we discussed and let me know if I can assist you in the future.   These are the goals we discussed: Goals      Patient Stated   . patient (pt-stated)     More stress relief and balance; Getting out and meeting people and enjoying art;      Other   . Patient Stated     Maintain current health status.    . Weight (lb) < 160 lb (72.6 kg)     Patient states she would like to lose 40 pounds.        This is a list of the screening recommended for you and due dates:  Health Maintenance  Topic Date Due  . Mammogram  08/19/2017  . Flu Shot  12/13/2017*  . Tetanus Vaccine  09/14/2021  . Colon Cancer Screening  02/09/2024  . DEXA scan (bone density measurement)  Completed  .  Hepatitis C: One time screening is recommended by Center for Disease Control  (CDC) for  adults born from [birth year] through 1966.   Completed  . Pneumonia vaccines  Completed  *Topic was postponed. The date shown is not the original due date.

## 2017-11-03 NOTE — Telephone Encounter (Signed)
During AWV, patient expressed that her home life with her daughter and grandson has been extremely stressful and she is experiencing a lot of anxiety as a result. Patient requested anti-anxiety medication to help and states that xanax has worked well for her in the past. Patient did accept counseling resources and she denies feelings of wanting to hurt herself in anyway,  PHQ-9 score is 10.  Patient's next PCP appointment is 12/14/17. Patient asked for the prescription to be sent to [facility].

## 2017-11-04 NOTE — Telephone Encounter (Signed)
Called patient and LVM that PCP sent the requested prescriptions to her pharmacy.

## 2017-12-14 ENCOUNTER — Other Ambulatory Visit (INDEPENDENT_AMBULATORY_CARE_PROVIDER_SITE_OTHER): Payer: Medicare Other

## 2017-12-14 ENCOUNTER — Encounter: Payer: Self-pay | Admitting: Internal Medicine

## 2017-12-14 ENCOUNTER — Ambulatory Visit (INDEPENDENT_AMBULATORY_CARE_PROVIDER_SITE_OTHER): Payer: Medicare Other | Admitting: Internal Medicine

## 2017-12-14 VITALS — BP 160/100 | HR 81 | Temp 97.5°F | Resp 16 | Ht 65.0 in | Wt 208.0 lb

## 2017-12-14 DIAGNOSIS — I10 Essential (primary) hypertension: Secondary | ICD-10-CM

## 2017-12-14 DIAGNOSIS — N182 Chronic kidney disease, stage 2 (mild): Secondary | ICD-10-CM | POA: Diagnosis not present

## 2017-12-14 DIAGNOSIS — E213 Hyperparathyroidism, unspecified: Secondary | ICD-10-CM | POA: Diagnosis not present

## 2017-12-14 DIAGNOSIS — E785 Hyperlipidemia, unspecified: Secondary | ICD-10-CM

## 2017-12-14 DIAGNOSIS — E039 Hypothyroidism, unspecified: Secondary | ICD-10-CM

## 2017-12-14 LAB — LIPID PANEL
Cholesterol: 211 mg/dL — ABNORMAL HIGH (ref 0–200)
HDL: 53.7 mg/dL (ref >39.00)
LDL Cholesterol: 131 mg/dL — ABNORMAL HIGH (ref 0–99)
NonHDL: 157.01
Total CHOL/HDL Ratio: 4
Triglycerides: 131 mg/dL (ref 0.0–149.0)
VLDL: 26.2 mg/dL (ref 0.0–40.0)

## 2017-12-14 LAB — COMPREHENSIVE METABOLIC PANEL
ALT: 25 U/L (ref 0–35)
AST: 21 U/L (ref 0–37)
Albumin: 4.1 g/dL (ref 3.5–5.2)
Alkaline Phosphatase: 66 U/L (ref 39–117)
BUN: 11 mg/dL (ref 6–23)
CO2: 28 mEq/L (ref 19–32)
Calcium: 10.6 mg/dL — ABNORMAL HIGH (ref 8.4–10.5)
Chloride: 104 mEq/L (ref 96–112)
Creatinine, Ser: 1.41 mg/dL — ABNORMAL HIGH (ref 0.40–1.20)
GFR: 47.04 mL/min — ABNORMAL LOW (ref >60.00)
Glucose, Bld: 102 mg/dL — ABNORMAL HIGH (ref 70–99)
Potassium: 3.8 mEq/L (ref 3.5–5.1)
Sodium: 140 mEq/L (ref 135–145)
Total Bilirubin: 0.8 mg/dL (ref 0.2–1.2)
Total Protein: 7.7 g/dL (ref 6.0–8.3)

## 2017-12-14 LAB — TSH: TSH: 0.38 u[IU]/mL (ref 0.35–4.50)

## 2017-12-14 LAB — VITAMIN D 25 HYDROXY (VIT D DEFICIENCY, FRACTURES): VITD: 47.64 ng/mL (ref 30.00–100.00)

## 2017-12-14 MED ORDER — ROSUVASTATIN CALCIUM 10 MG PO TABS: 10.0000 mg | ORAL_TABLET | Freq: Every day | ORAL | 1 refills | Status: DC

## 2017-12-14 MED ORDER — CHLORTHALIDONE 25 MG PO TABS: 25.0000 mg | ORAL_TABLET | Freq: Every day | ORAL | 1 refills | Status: DC

## 2017-12-14 NOTE — Patient Instructions (Signed)

## 2017-12-14 NOTE — Progress Notes (Signed)
Subjective:  Patient ID: Valerie Jackson, female    DOB: 1946/07/15  Age: 72 y.o. MRN: [id number]  CC: Hypertension; Hypothyroidism; and Hyperlipidemia   HPI Valerie Jackson presents for f/up - She is concerned that her blood pressure has not been well controlled.  Somehow she has ended up taking hydrochlorothiazide a couple times a week when she feels like there is swelling in her legs and then at other times she takes chlorthalidone.  She tells me this combination is not controlling her blood pressure.  She denies any recent episodes of headache, blurred vision, chest pain, shortness of breath, palpitations, edema, or fatigue.  She continues to struggle with the inability to lose weight.  Outpatient Medications Prior to Visit  Medication Sig Dispense Refill  . acetaminophen (TYLENOL) 500 MG tablet Take 1,000 mg by mouth every 6 (six) hours as needed for headache.    . ALPRAZolam (XANAX) 0.5 MG tablet Take 1 tablet (0.5 mg total) by mouth at bedtime as needed for anxiety. 180 tablet 1  . amoxicillin (AMOXIL) 500 MG capsule Take 500 mg by mouth 3 (three) times daily.    . calcium carbonate (OS-CAL - DOSED IN MG OF ELEMENTAL CALCIUM) 1250 (500 CA) MG tablet Take 1 tablet by mouth daily as needed. Reported on 04/03/2016    . cholecalciferol (VITAMIN D) 1000 UNITS tablet Take 2,000 Units by mouth daily.     . clobetasol cream (TEMOVATE) 6.30 % Apply 1 application topically 2 (two) times daily. 60 g 2  . Fluticasone Furoate (ARNUITY ELLIPTA) 100 MCG/ACT AEPB Inhale 1 puff into the lungs daily. 30 each 5  . levothyroxine (SYNTHROID, LEVOTHROID) 112 MCG tablet TAKE 1 TABLET EVERY DAY 90 tablet 0  . PROLENSA 0.07 % SOLN PLACE 1 DROP INTO LEFT EYE EVERY DAY BEGIN 2 DAYS BEFORE SURGERY  0  . rizatriptan (MAXALT-MLT) 10 MG disintegrating tablet Take 1 tablet (10 mg total) by mouth as needed for migraine. May repeat in 2 hours if needed 30 tablet 1  . hydrochlorothiazide (MICROZIDE) 12.5 MG capsule Take  12.5 mg by mouth daily.     [name] zolpidem (AMBIEN) 5 MG tablet Take 1 tablet (5 mg total) by mouth at bedtime as needed for sleep. 30 tablet 5  . escitalopram (LEXAPRO) 10 MG tablet Take 1 tablet (10 mg total) by mouth daily. 90 tablet 1  . chlorthalidone (HYGROTON) 25 MG tablet TAKE 1/2 TABLET EVERY DAY (Patient taking differently: takes as needed) 45 tablet 1   No facility-administered medications prior to visit.     ROS Review of Systems  Constitutional: Negative.  Negative for appetite change, diaphoresis, fatigue and unexpected weight change.  HENT: Negative.   Eyes: Negative for visual disturbance.  Respiratory: Negative for cough, chest tightness, shortness of breath and wheezing.   Cardiovascular: Negative for chest pain, palpitations and leg swelling.  Gastrointestinal: Negative for abdominal pain, constipation, diarrhea, nausea and vomiting.  Endocrine: Negative.  Negative for cold intolerance and heat intolerance.  Genitourinary: Negative.  Negative for difficulty urinating, dysuria and hematuria.  Musculoskeletal: Negative.  Negative for arthralgias, back pain, myalgias and neck pain.  Skin: Negative.  Negative for color change and pallor.  Neurological: Negative.  Negative for dizziness, weakness and light-headedness.  Hematological: Negative for adenopathy. Does not bruise/bleed easily.  Psychiatric/Behavioral: Negative.     Objective:  BP (!) 160/100 (BP Location: Left Arm, Patient Position: Sitting, Cuff Size: Large)   Pulse 81   Temp (!) 97.5 F (36.4 C) (  Oral)   Resp 16   Ht 5' 5"  (1.651 m)   Wt 273 lb 0.6 oz (123.9 kg)   SpO2 96%   BMI 45.44 kg/m   BP Readings from Last 3 Encounters:  12/14/17 (!) 160/100  11/03/17 122/70  09/27/17 120/72    Wt Readings from Last 3 Encounters:  12/14/17 273 lb 0.6 oz (123.9 kg)  11/03/17 212 lb (96.2 kg)  09/27/17 209 lb (94.8 kg)    Physical Exam  Constitutional: She is oriented to person, place, and time. No  distress.  HENT:  Mouth/Throat: Oropharynx is clear and moist. No oropharyngeal exudate.  Eyes: Conjunctivae are normal. Left eye exhibits no discharge. No scleral icterus.  Neck: Normal range of motion. Neck supple. No JVD present. No thyromegaly present.  Cardiovascular: Normal rate, regular rhythm and normal heart sounds. Exam reveals no gallop and no friction rub.  No murmur heard. Pulmonary/Chest: Effort normal and breath sounds normal. No respiratory distress. She has no wheezes. She has no rales.  Abdominal: Soft. Bowel sounds are normal. She exhibits no distension and no mass. There is no tenderness. There is no guarding.  Musculoskeletal: Normal range of motion. She exhibits no edema, tenderness or deformity.  Lymphadenopathy:    She has no cervical adenopathy.  Neurological: She is alert and oriented to person, place, and time.  Skin: Skin is warm and dry. No rash noted. She is not diaphoretic. No erythema. No pallor.  Vitals reviewed.   Lab Results  Component Value Date   WBC 7.1 06/24/2017   HGB 12.8 06/24/2017   HCT 37.9 06/24/2017   PLT 198 06/24/2017   GLUCOSE 102 (H) 12/14/2017   CHOL 211 (H) 12/14/2017   TRIG 131.0 12/14/2017   HDL 53.70 12/14/2017   LDLDIRECT 226.0 02/27/2013   LDLCALC 131 (H) 12/14/2017   ALT 25 12/14/2017   AST 21 12/14/2017   NA 140 12/14/2017   K 3.8 12/14/2017   CL 104 12/14/2017   CREATININE 1.41 (H) 12/14/2017   BUN 11 12/14/2017   CO2 28 12/14/2017   TSH 0.38 12/14/2017   INR 1.02 06/24/2017   HGBA1C 4.7 05/14/2014    No results found.  Assessment & Plan:   [name] was seen today for hypertension, hypothyroidism and hyperlipidemia.  Diagnoses and all orders for this visit:  Acquired hypothyroidism-her TSH is in the normal range.  She will remain on the current dose of levothyroxine. -     TSH; Future  Hyperparathyroidism (HCC)-her calcium level is slightly high again.  This may be caused by the thiazide diuretic.  I will  monitor her vitamin D level and her PTH level. -     Comprehensive metabolic panel; Future -     VITAMIN D 25 Hydroxy (Vit-D Deficiency, Fractures); Future -     PTH, intact and calcium; Future  Essential hypertension- Her blood pressure is not adequately well controlled.  Her lab work is negative for secondary causes or endorgan damage.  I have asked her to stop taking hydrochlorothiazide and to take chlorthalidone for blood pressure control.  I will also increase the dose of the chlorthalidone to 25 mg a day. -     Comprehensive metabolic panel; Future -     Urinalysis, Routine w reflex microscopic; Future -     chlorthalidone (HYGROTON) 25 MG tablet; Take 1 tablet (25 mg total) by mouth daily.  CKD (chronic kidney disease) stage 2, GFR 60-89 ml/min- Her renal function is stable.  She will avoid  nephrotoxic agents.  I will try to maintain better blood pressure control. -     Comprehensive metabolic panel; Future  Hyperlipidemia with target LDL less than 160- She has an elevated ASCVD risk score.  I have asked her to start taking a statin for CV risk reduction. -     Lipid panel; Future   I have discontinued [name] R. Haik's zolpidem and hydrochlorothiazide. I have also changed her chlorthalidone. Additionally, I am having her maintain her cholecalciferol, calcium carbonate, acetaminophen, clobetasol cream, Fluticasone Furoate, levothyroxine, PROLENSA, rizatriptan, amoxicillin, ALPRAZolam, and escitalopram.  Meds ordered this encounter  Medications  . chlorthalidone (HYGROTON) 25 MG tablet    Sig: Take 1 tablet (25 mg total) by mouth daily.    Dispense:  90 tablet    Refill:  1     Follow-up: Return in about 6 weeks (around 01/25/2018).  [name], MD

## 2017-12-15 ENCOUNTER — Encounter: Payer: Self-pay | Admitting: Internal Medicine

## 2017-12-15 LAB — PTH, INTACT AND CALCIUM
Calcium: 10.7 mg/dL — ABNORMAL HIGH (ref 8.6–10.4)
PTH: 47 pg/mL (ref 14–64)

## 2017-12-21 ENCOUNTER — Ambulatory Visit
Admission: RE | Admit: 2017-12-21 | Discharge: 2017-12-21 | Disposition: A | Discharge status: Home / Self Care | Payer: Medicare Other | Source: Ambulatory Visit | Attending: Internal Medicine | Admitting: Internal Medicine

## 2017-12-21 DIAGNOSIS — Z1231 Encounter for screening mammogram for malignant neoplasm of breast: Secondary | ICD-10-CM

## 2017-12-21 LAB — HM MAMMOGRAPHY

## 2018-01-18 ENCOUNTER — Encounter: Payer: Self-pay | Admitting: Internal Medicine

## 2018-01-18 ENCOUNTER — Other Ambulatory Visit (INDEPENDENT_AMBULATORY_CARE_PROVIDER_SITE_OTHER): Payer: Medicare Other

## 2018-01-18 ENCOUNTER — Ambulatory Visit (INDEPENDENT_AMBULATORY_CARE_PROVIDER_SITE_OTHER): Payer: Medicare Other | Admitting: Internal Medicine

## 2018-01-18 VITALS — BP 128/80 | HR 88 | Temp 98.2°F | Resp 16 | Ht 65.0 in | Wt 211.0 lb

## 2018-01-18 DIAGNOSIS — E876 Hypokalemia: Secondary | ICD-10-CM

## 2018-01-18 DIAGNOSIS — I10 Essential (primary) hypertension: Secondary | ICD-10-CM | POA: Diagnosis not present

## 2018-01-18 LAB — URINALYSIS, ROUTINE W REFLEX MICROSCOPIC
Bilirubin Urine: NEGATIVE
Ketones, ur: NEGATIVE
Leukocytes, UA: NEGATIVE
Nitrite: NEGATIVE
Specific Gravity, Urine: 1.01 (ref 1.000–1.030)
Total Protein, Urine: NEGATIVE
Urine Glucose: NEGATIVE
Urobilinogen, UA: 0.2 (ref 0.0–1.0)
pH: 6 (ref 5.0–8.0)

## 2018-01-18 LAB — BASIC METABOLIC PANEL
BUN: 18 mg/dL (ref 6–23)
CO2: 29 mEq/L (ref 19–32)
Calcium: 10.2 mg/dL (ref 8.4–10.5)
Chloride: 101 mEq/L (ref 96–112)
Creatinine, Ser: 1.56 mg/dL — ABNORMAL HIGH (ref 0.40–1.20)
GFR: 41.85 mL/min — ABNORMAL LOW (ref >60.00)
Glucose, Bld: 107 mg/dL — ABNORMAL HIGH (ref 70–99)
Potassium: 3.3 mEq/L — ABNORMAL LOW (ref 3.5–5.1)
Sodium: 140 mEq/L (ref 135–145)

## 2018-01-18 MED ORDER — SPIRONOLACTONE 25 MG PO TABS: 25.0000 mg | ORAL_TABLET | Freq: Every day | ORAL | 1 refills | Status: DC

## 2018-01-18 NOTE — Patient Instructions (Signed)

## 2018-01-18 NOTE — Progress Notes (Signed)
Subjective:  Patient ID: Valerie Jackson, female    DOB: 05-Nov-1945  Age: 72 y.o. MRN: [id number]  CC: Hypertension   HPI Valerie Jackson presents for f/up - She tells me her blood pressure has been well controlled since she started taking chlorthalidone.  However, she also complains of muscle cramps and twitches and a few episodes of dizziness and lightheadedness.  Outpatient Medications Prior to Visit  Medication Sig Dispense Refill  . acetaminophen (TYLENOL) 500 MG tablet Take 1,000 mg by mouth every 6 (six) hours as needed for headache.    . ALPRAZolam (XANAX) 0.5 MG tablet Take 1 tablet (0.5 mg total) by mouth at bedtime as needed for anxiety. 180 tablet 1  . cholecalciferol (VITAMIN D) 1000 UNITS tablet Take 2,000 Units by mouth daily.     . clobetasol cream (TEMOVATE) 2.83 % Apply 1 application topically 2 (two) times daily. 60 g 2  . escitalopram (LEXAPRO) 10 MG tablet Take 1 tablet (10 mg total) by mouth daily. 90 tablet 1  . Fluticasone Furoate (ARNUITY ELLIPTA) 100 MCG/ACT AEPB Inhale 1 puff into the lungs daily. 30 each 5  . levothyroxine (SYNTHROID, LEVOTHROID) 112 MCG tablet TAKE 1 TABLET EVERY DAY 90 tablet 0  . rizatriptan (MAXALT-MLT) 10 MG disintegrating tablet Take 1 tablet (10 mg total) by mouth as needed for migraine. May repeat in 2 hours if needed 30 tablet 1  . rosuvastatin (CRESTOR) 10 MG tablet Take 1 tablet (10 mg total) by mouth daily. 90 tablet 1  . calcium carbonate (OS-CAL - DOSED IN MG OF ELEMENTAL CALCIUM) 1250 (500 CA) MG tablet Take 1 tablet by mouth daily as needed. Reported on 04/03/2016    . chlorthalidone (HYGROTON) 25 MG tablet Take 1 tablet (25 mg total) by mouth daily. 90 tablet 1  . amoxicillin (AMOXIL) 500 MG capsule Take 500 mg by mouth 3 (three) times daily.    [name] PROLENSA 0.07 % SOLN PLACE 1 DROP INTO LEFT EYE EVERY DAY BEGIN 2 DAYS BEFORE SURGERY  0   No facility-administered medications prior to visit.     ROS Review of Systems    Constitutional: Negative for appetite change, diaphoresis, fatigue and unexpected weight change.  HENT: Negative.   Eyes: Negative for visual disturbance.  Respiratory: Negative for cough, chest tightness, shortness of breath and wheezing.   Cardiovascular: Negative for chest pain, palpitations and leg swelling.  Gastrointestinal: Negative for abdominal pain, diarrhea and nausea.  Endocrine: Negative for polyuria.  Genitourinary: Negative.  Negative for difficulty urinating, flank pain, frequency and urgency.  Musculoskeletal: Positive for myalgias.  Skin: Negative.  Negative for color change and pallor.  Neurological: Positive for dizziness and light-headedness. Negative for weakness.  Hematological: Negative for adenopathy. Does not bruise/bleed easily.  Psychiatric/Behavioral: Negative.     Objective:  BP 128/80 (BP Location: Left Arm, Patient Position: Sitting, Cuff Size: Normal)   Pulse 88   Temp 98.2 F (36.8 C) (Oral)   Resp 16   Ht 5' 5"  (1.651 m)   Wt 211 lb (95.7 kg)   SpO2 94%   BMI 35.11 kg/m   BP Readings from Last 3 Encounters:  01/18/18 128/80  12/14/17 (!) 160/100  11/03/17 122/70    Wt Readings from Last 3 Encounters:  01/18/18 211 lb (95.7 kg)  12/14/17 208 lb (94.3 kg)  11/03/17 212 lb (96.2 kg)    Physical Exam  Constitutional: She is oriented to person, place, and time. No distress.  HENT:  Mouth/Throat: Oropharynx  is clear and moist. No oropharyngeal exudate.  Eyes: Conjunctivae are normal. No scleral icterus.  Neck: Normal range of motion. Neck supple. No JVD present. No thyromegaly present.  Cardiovascular: Normal rate, regular rhythm and normal heart sounds.  No murmur heard. Pulmonary/Chest: Effort normal and breath sounds normal. She has no wheezes. She has no rales.  Abdominal: Bowel sounds are normal. She exhibits no mass. There is no tenderness.  Musculoskeletal: She exhibits no edema, tenderness or deformity.  Neurological: She is  alert and oriented to person, place, and time.  Skin: Skin is warm and dry. She is not diaphoretic.  Vitals reviewed.   Lab Results  Component Value Date   WBC 7.1 06/24/2017   HGB 12.8 06/24/2017   HCT 37.9 06/24/2017   PLT 198 06/24/2017   GLUCOSE 107 (H) 01/18/2018   CHOL 211 (H) 12/14/2017   TRIG 131.0 12/14/2017   HDL 53.70 12/14/2017   LDLDIRECT 226.0 02/27/2013   LDLCALC 131 (H) 12/14/2017   ALT 25 12/14/2017   AST 21 12/14/2017   NA 140 01/18/2018   K 3.3 (L) 01/18/2018   CL 101 01/18/2018   CREATININE 1.56 (H) 01/18/2018   BUN 18 01/18/2018   CO2 29 01/18/2018   TSH 0.38 12/14/2017   INR 1.02 06/24/2017   HGBA1C 4.7 05/14/2014    Mm Screening Breast Tomo Bilateral  Result Date: 12/21/2017 CLINICAL DATA:  Screening. EXAM: DIGITAL SCREENING BILATERAL MAMMOGRAM WITH TOMO AND CAD COMPARISON:  Previous exam(s). ACR Breast Density Category b: There are scattered areas of fibroglandular density. FINDINGS: There are no findings suspicious for malignancy. Images were processed with CAD. IMPRESSION: No mammographic evidence of malignancy. A result letter of this screening mammogram will be mailed directly to the patient. RECOMMENDATION: Screening mammogram in one year. (Code:SM-B-01Y) BI-RADS CATEGORY  1: Negative. Electronically Signed   By: Lajean Manes M.D.   On: 12/21/2017 16:52    Assessment & Plan:   Valerie Jackson was seen today for hypertension.  Diagnoses and all orders for this visit:  Essential hypertension- Her blood pressure is somewhat over controlled and she has developed symptomatic hypokalemia.  I have asked her to stop taking the thiazide diuretic and upgrade to a potassium sparing diuretic. -     Basic metabolic panel; Future -     spironolactone (ALDACTONE) 25 MG tablet; Take 1 tablet (25 mg total) by mouth daily.  Hypokalemia- as above -     spironolactone (ALDACTONE) 25 MG tablet; Take 1 tablet (25 mg total) by mouth daily.   I have discontinued [place] calcium carbonate, PROLENSA, amoxicillin, and chlorthalidone. I am also having her start on spironolactone. Additionally, I am having her maintain her cholecalciferol, acetaminophen, clobetasol cream, Fluticasone Furoate, levothyroxine, rizatriptan, ALPRAZolam, escitalopram, and rosuvastatin.  Meds ordered this encounter  Medications  . spironolactone (ALDACTONE) 25 MG tablet    Sig: Take 1 tablet (25 mg total) by mouth daily.    Dispense:  90 tablet    Refill:  1     Follow-up: Return in about 6 months (around 07/21/2018).  [name], MD

## 2018-03-18 ENCOUNTER — Ambulatory Visit (INDEPENDENT_AMBULATORY_CARE_PROVIDER_SITE_OTHER): Payer: Medicare Other | Admitting: Internal Medicine

## 2018-03-18 ENCOUNTER — Encounter: Payer: Self-pay | Admitting: Internal Medicine

## 2018-03-18 ENCOUNTER — Other Ambulatory Visit (INDEPENDENT_AMBULATORY_CARE_PROVIDER_SITE_OTHER): Payer: Medicare Other

## 2018-03-18 VITALS — BP 100/70 | HR 82 | Temp 97.9°F | Ht 65.0 in

## 2018-03-18 DIAGNOSIS — M791 Myalgia, unspecified site: Secondary | ICD-10-CM | POA: Insufficient documentation

## 2018-03-18 LAB — COMPREHENSIVE METABOLIC PANEL
ALT: 19 U/L (ref 0–35)
AST: 17 U/L (ref 0–37)
Albumin: 4.3 g/dL (ref 3.5–5.2)
Alkaline Phosphatase: 55 U/L (ref 39–117)
BUN: 19 mg/dL (ref 6–23)
CO2: 29 mEq/L (ref 19–32)
Calcium: 10.6 mg/dL — ABNORMAL HIGH (ref 8.4–10.5)
Chloride: 97 mEq/L (ref 96–112)
Creatinine, Ser: 1.6 mg/dL — ABNORMAL HIGH (ref 0.40–1.20)
GFR: 40.63 mL/min — ABNORMAL LOW (ref >60.00)
Glucose, Bld: 145 mg/dL — ABNORMAL HIGH (ref 70–99)
Potassium: 3 mEq/L — ABNORMAL LOW (ref 3.5–5.1)
Sodium: 138 mEq/L (ref 135–145)
Total Bilirubin: 0.9 mg/dL (ref 0.2–1.2)
Total Protein: 7.7 g/dL (ref 6.0–8.3)

## 2018-03-18 LAB — CBC
HCT: 39.2 % (ref 36.0–46.0)
Hemoglobin: 13 g/dL (ref 12.0–15.0)
MCHC: 33.3 g/dL (ref 30.0–36.0)
MCV: 87.6 fl (ref 78.0–100.0)
Platelets: 244 10*3/uL (ref 150.0–400.0)
RBC: 4.47 Mil/uL (ref 3.87–5.11)
RDW: 13.8 % (ref 11.5–15.5)
WBC: 13 10*3/uL — ABNORMAL HIGH (ref 4.0–10.5)

## 2018-03-18 LAB — MAGNESIUM: Magnesium: 1.9 mg/dL (ref 1.5–2.5)

## 2018-03-18 LAB — CK: Total CK: 72 U/L (ref 7–177)

## 2018-03-18 MED ORDER — METHYLPREDNISOLONE ACETATE 40 MG/ML IJ SUSP
40.0000 mg | Freq: Once | INTRAMUSCULAR | Status: AC
  Administered 2018-03-18: 40 mg via INTRAMUSCULAR

## 2018-03-18 MED ORDER — METHOCARBAMOL 500 MG PO TABS: 500.0000 mg | ORAL_TABLET | Freq: Every evening | ORAL | 0 refills | Status: DC | PRN

## 2018-03-18 NOTE — Assessment & Plan Note (Signed)
Checking CBC, CMP, CK, magnesium. She has had low potassium in the past. She is advised to try magnesium over the counter. Given depo-medrol 40 mg IM today. Rx for robaxin for night time as needed.

## 2018-03-18 NOTE — Progress Notes (Signed)
   Subjective:    Patient ID: Valerie Jackson, female    DOB: September 10, 1946, 72 y.o.   MRN: [id number]  HPI The patient is a 72 YO female coming in for cramps in her muscles and pain. Started around 4AM this morning. She denies any change in diet, trauma or injury. She often has cramps in her legs which usually only lasts for a few minutes and she is able to stretch that away. She has not been able to stop the pain. She tried heat, massage, tylenol which were not effective. This is in her right arch and it is keeping her from walking. Pain is 10/10. Her daughter kept interjection throughout the visit to see if her mother could get valium for the muscle pain.  Review of Systems  Constitutional: Positive for activity change. Negative for appetite change, chills, fatigue, fever and unexpected weight change.  Respiratory: Negative.   Cardiovascular: Negative.   Gastrointestinal: Negative.   Musculoskeletal: Positive for arthralgias and myalgias. Negative for back pain, gait problem and joint swelling.  Skin: Negative.   Neurological: Negative.       Objective:   Physical Exam  Constitutional: She is oriented to person, place, and time. She appears well-developed and well-nourished.  HENT:  Head: Normocephalic and atraumatic.  Eyes: EOM are normal.  Neck: Normal range of motion.  Cardiovascular: Normal rate and regular rhythm.  Pulmonary/Chest: Effort normal and breath sounds normal. No respiratory distress. She has no wheezes. She has no rales.  Abdominal: Soft. She exhibits no distension. There is no tenderness. There is no rebound.  Musculoskeletal: She exhibits tenderness. She exhibits no edema.  Pain right arch, some mild swelling, no rash or redness  Neurological: She is alert and oriented to person, place, and time. Coordination normal.  Skin: Skin is warm and dry.   Vitals:   03/18/18 1432  BP: 100/70  Pulse: 82  Temp: 97.9 F (36.6 C)  TempSrc: Oral  SpO2: 96%  Height: 5' 5"   (1.651 m)      Assessment & Plan:  Depo-medrol 40 mg IM given at visit

## 2018-03-18 NOTE — Patient Instructions (Signed)
We are checking the labs today and will call you back about the results.   Try taking magnesium over the counter daily for the next 1-2 weeks.   We have sent in robaxin to take at night time for pain if needed.   We have given you a steroid shot today which can take 1-2 days to work to help relax the muscles.

## 2018-03-22 ENCOUNTER — Other Ambulatory Visit: Payer: Self-pay | Admitting: Internal Medicine

## 2018-03-22 MED ORDER — POTASSIUM CHLORIDE CRYS ER 20 MEQ PO TBCR: 40.0000 meq | EXTENDED_RELEASE_TABLET | Freq: Every day | ORAL | 0 refills | Status: DC

## 2018-03-28 ENCOUNTER — Other Ambulatory Visit: Payer: Self-pay | Admitting: Internal Medicine

## 2018-03-28 DIAGNOSIS — F418 Other specified anxiety disorders: Secondary | ICD-10-CM

## 2018-03-29 ENCOUNTER — Ambulatory Visit (INDEPENDENT_AMBULATORY_CARE_PROVIDER_SITE_OTHER): Payer: Medicare Other | Admitting: Pulmonary Disease

## 2018-03-29 ENCOUNTER — Encounter: Payer: Self-pay | Admitting: Pulmonary Disease

## 2018-03-29 VITALS — BP 122/60 | HR 57 | Ht 65.5 in | Wt 211.0 lb

## 2018-03-29 DIAGNOSIS — J439 Emphysema, unspecified: Secondary | ICD-10-CM | POA: Diagnosis not present

## 2018-03-29 MED ORDER — FLUTICASONE-UMECLIDIN-VILANT 100-62.5-25 MCG/INH IN AEPB: 1.0000 | INHALATION_SPRAY | Freq: Every day | RESPIRATORY_TRACT | 0 refills | Status: DC

## 2018-03-29 NOTE — Patient Instructions (Signed)
COPD: Stop taking Arnuity for now Take the sample of Trelegy 1 puff daily no matter how you feel If you notice an improvement in shortness of breath or fatigue while taking Trelegy, review your formulary to see what other inhaler options are covered by your insurance then call us. Get a flu shot in the fall Stay active  Follow-up in 6 months or sooner if needed

## 2018-03-29 NOTE — Progress Notes (Signed)
Subjective:    Patient ID: Valerie Jackson, female    DOB: Aug 11, 1946, 72 y.o.   MRN: [id number]  Synopsis: Referred in June 2018 by the cardiology clinic for evaluation of dyspnea with abnormal pulmonary function testing.She has mild centrilobular emphysema and asthma but not COPD.  She smoked for 40 years and quit smoking in 2013.   HPI Chief Complaint  Patient presents with  . Follow-up   Since the last visit [name] says that she has not been tolerant of heavy exercise or activities.  Some of this she attributes to the heat out.  She can't really pinpoint her breathing as a particular problem, she just feels "wiped out".  Sometimes if she exerts herself a lot she will have more leg swelling as well.  Not coughing more.  She will produce some mucus, sometimes quit thick in the mornings, then thinner.  No wheezing or chest tightness.  No rescue inhaler use. No bronchitis or pneumonia since the last visit.    Past Medical History:  Diagnosis Date  . Arthritis    fingers  . Blood clot in vein 1968  . Cataracts, bilateral   . CHF (congestive heart failure) ([place])   . Chronic kidney disease    CKD stage 2  . COPD (chronic obstructive pulmonary disease) ([place])   . Depression   . Diverticulitis 09/2013   with perforation  . Dizziness    with decreased BP readings or bending over  . Floaters   . GERD (gastroesophageal reflux disease)   . H/O hiatal hernia   . Headache(784.0)    migraine every 5- to 6 years  . Heart murmur since age 27   mild  . Hepatitis age 6 or 9   had heapatitis, not sure which kind  . History of blood transfusion 1965  . History of frequent urinary tract infections   . Hypercholesteremia   . Hypertension   . Hypothyroid   . Obesity   . Pericolonic abscess 2015  . Pneumonia   . Sickle cell trait ([place])           Review of Systems  Constitutional: Negative for fever and unexpected weight change.  HENT: Negative for congestion, dental problem, ear  pain, nosebleeds, postnasal drip, rhinorrhea, sinus pressure, sneezing, sore throat and trouble swallowing.   Eyes: Negative for redness and itching.  Respiratory: Positive for shortness of breath. Negative for cough, chest tightness and wheezing.   Cardiovascular: Negative for palpitations and leg swelling.  Gastrointestinal: Negative for nausea and vomiting.  Genitourinary: Negative for dysuria.  Musculoskeletal: Negative for joint swelling.  Skin: Negative for rash.  Neurological: Negative for headaches.  Hematological: Does not bruise/bleed easily.  Psychiatric/Behavioral: Negative for dysphoric mood. The patient is not nervous/anxious.        Objective:   Physical Exam  Vitals:   03/29/18 0911  BP: 122/60  Pulse: (!) 57  SpO2: 95%  Weight: 211 lb (95.7 kg)  Height: 5' 5.5" (1.664 m)    Gen: chronically ill appearing HENT: OP clear, TM's clear, neck supple PULM: CTA B, normal percussion CV: RRR, no mgr, trace edema GI: BS+, soft, nontender Derm: no cyanosis or rash Psyche: normal mood and affect   CBC    Component Value Date/Time   WBC 13.0 (H) 03/18/2018 1516   RBC 4.47 03/18/2018 1516   HGB 13.0 03/18/2018 1516   HGB 10.7 04/16/2014   HCT 39.2 03/18/2018 1516   HCT 32 04/16/2014   PLT 244.0  03/18/2018 1516   MCV 87.6 03/18/2018 1516   MCV 86.0 04/16/2014   MCH 30.0 06/24/2017 0846   MCHC 33.3 03/18/2018 1516   RDW 13.8 03/18/2018 1516   RDW 14.2 04/16/2014   LYMPHSABS 2.2 03/30/2017 0949   MONOABS 0.4 03/30/2017 0949   EOSABS 0.3 03/30/2017 0949   BASOSABS 0.1 03/30/2017 0949      Chest imaging: 2018 chest x-ray images independently reviewed showing flattening of the diaphragms but no pulmonary parenchymal abnormality 2015 chest CT images independently reviewed showing possible cysts versus mild centrilobular emphysema in the left lung only, otherwise normal pulmonary parenchyma June 2018 high-resolution CT chest images independently reviewed  showing mild centrilobular emphysema and an upper lobe distribution no interstitial lung disease findings July 2018 VQ scan negative for pulmonary bolus him  PFT: May 2018 PFT ratio 69%, FEV1 1.55 L, improved to 1.74 L with resolution of airflow obstruction, 12% change post bronchodilator, change was only 190 mL however, forced vital capacity 2.35 L 92% predicted, total lung capacity 4.08 L 76% predicted, DLCO 11.81 43% predicted  Echo: April 2018: Mild concentric left ventricular hypertrophy, systolic function normal, LVEF 50-55%, normal wall motion, grade 1 diastolic dysfunction, aortic valve normal, trivial mitral regurgitation, right ventricle size and thickness and systolic function normal, atrial septum normal, trivial tricuspid regurgitation, pulmonary artery systolic pressure estimated 31 mmHg  Exercise stress test: August 2017 exercise stress test: Negative stress test without evidence of ischemia, frequent PVCs noted  Heart catheterization: October 2018 PA mean pressure 15, pulmonary capillary wedge pressure 7,  Exhaled Nitric Oxide: 02/2017 22 ppm      Assessment & Plan:   Pulmonary emphysema, unspecified emphysema type ([place])  Discussion: In general this has been a stable interval because [name] has not had an exacerbation of her COPD but she does remain symptomatic.  I think she would do better with better bronchodilator treatment.  We are going to give her a sample of a dual bronchodilator medicine today and then if she notes interval improvement in her symptoms we can change her prescription.  Plan: COPD: Stop taking Arnuity for now Take the sample of Trelegy 1 puff daily no matter how you feel If you notice an improvement in shortness of breath or fatigue while taking Trelegy, review your formulary to see what other inhaler options are covered by your insurance then call us. Get a flu shot in the fall Stay active  Anxiety: She asked me for a Xanax refill, I said no  as I don't prescribe this medicine for her  Follow-up in 6 months or sooner if needed   Current Outpatient Medications:  .  acetaminophen (TYLENOL) 500 MG tablet, Take 1,000 mg by mouth every 6 (six) hours as needed for headache., Disp: , Rfl:  .  ALPRAZolam (XANAX) 0.5 MG tablet, Take 1 tablet (0.5 mg total) by mouth at bedtime as needed for anxiety., Disp: 180 tablet, Rfl: 1 .  cholecalciferol (VITAMIN D) 1000 UNITS tablet, Take 2,000 Units by mouth daily. , Disp: , Rfl:  .  clobetasol cream (TEMOVATE) 1.96 %, Apply 1 application topically 2 (two) times daily., Disp: 60 g, Rfl: 2 .  escitalopram (LEXAPRO) 10 MG tablet, TAKE 1 TABLET DAILY., Disp: 90 tablet, Rfl: 1 .  Fluticasone Furoate (ARNUITY ELLIPTA) 100 MCG/ACT AEPB, Inhale 1 puff into the lungs daily., Disp: 30 each, Rfl: 5 .  levothyroxine (SYNTHROID, LEVOTHROID) 112 MCG tablet, TAKE 1 TABLET EVERY DAY, Disp: 90 tablet, Rfl: 0 .  rizatriptan (MAXALT-MLT)  10 MG disintegrating tablet, Take 1 tablet (10 mg total) by mouth as needed for migraine. May repeat in 2 hours if needed, Disp: 30 tablet, Rfl: 1 .  rosuvastatin (CRESTOR) 10 MG tablet, Take 1 tablet (10 mg total) by mouth daily., Disp: 90 tablet, Rfl: 1 .  spironolactone (ALDACTONE) 25 MG tablet, Take 1 tablet (25 mg total) by mouth daily., Disp: 90 tablet, Rfl: 1 .  Fluticasone-Umeclidin-Vilant (TRELEGY ELLIPTA) 100-62.5-25 MCG/INH AEPB, Inhale 1 puff into the lungs daily., Disp: 2 each, Rfl: 0 .  methocarbamol (ROBAXIN) 500 MG tablet, Take 1 tablet (500 mg total) by mouth at bedtime as needed for muscle spasms. (Patient not taking: Reported on 03/29/2018), Disp: 30 tablet, Rfl: 0

## 2018-04-07 ENCOUNTER — Ambulatory Visit (INDEPENDENT_AMBULATORY_CARE_PROVIDER_SITE_OTHER): Payer: Medicare Other | Admitting: Endocrinology

## 2018-04-07 ENCOUNTER — Encounter: Payer: Self-pay | Admitting: Endocrinology

## 2018-04-07 DIAGNOSIS — I10 Essential (primary) hypertension: Secondary | ICD-10-CM

## 2018-04-07 DIAGNOSIS — E876 Hypokalemia: Secondary | ICD-10-CM | POA: Diagnosis not present

## 2018-04-07 MED ORDER — SPIRONOLACTONE 25 MG PO TABS: 12.5000 mg | ORAL_TABLET | Freq: Every day | ORAL | 1 refills | Status: DC

## 2018-04-07 NOTE — Progress Notes (Signed)
Subjective:    Patient ID: Valerie Jackson, female    DOB: 01-08-1946, 72 y.o.   MRN: [id number]  HPI Pt returns for f/u of hypercalcemia (dx'ed early 2017 (it was normal in 2016); chlorthalidone was continued, as hypercalcemia was mild; no other cause was found).   She also has mild hyperparathyroidism (renal insufficiency could contribute; she did not meet criteria for surgery; she has never had urolithisis).  Denies hematuria.  Pt returns for f/u of osteoporosis: Dx'ed: 2016 Secondary cause: mild elev PTH, but this is resolved.   Fractures: none Past rx: none Current rx: none Last DEXA result: 2019 (worst T-score was -0.7) Other: pt states she feels well in general. Interval hx: she denies edema  For HTN: She takes chlorthalidone QD now.   For Vit-D deficiency: she takes OTC supplement, 2000 units/day.     She has slight dizziness sensation in the head, but no assoc LOC Past Medical History:  Diagnosis Date  . Arthritis    fingers  . Blood clot in vein 1968  . Cataracts, bilateral   . CHF (congestive heart failure) ([place])   . Chronic kidney disease    CKD stage 2  . COPD (chronic obstructive pulmonary disease) ([place])   . Depression   . Diverticulitis 09/2013   with perforation  . Dizziness    with decreased BP readings or bending over  . Floaters   . GERD (gastroesophageal reflux disease)   . H/O hiatal hernia   . Headache(784.0)    migraine every 5- to 6 years  . Heart murmur since age 32   mild  . Hepatitis age 84 or 26   had heapatitis, not sure which kind  . History of blood transfusion 1965  . History of frequent urinary tract infections   . Hypercholesteremia   . Hypertension   . Hypothyroid   . Obesity   . Pericolonic abscess 2015  . Pneumonia   . Sickle cell trait [facility])     Past Surgical History:  Procedure Laterality Date  . CHOLECYSTECTOMY  2002  . COLON RESECTION N/A 10/11/2013   Procedure: EXPLORATORY LAPAROTOMY WITH RECTOSIGMOID COLECTOMY AND END  COLOSTOMY;  Surgeon: Stark Klein, MD;  Location: [facility];  Service: General;  Laterality: N/A;  . COLOSTOMY TAKEDOWN N/A 02/27/2014   Procedure: COLOSTOMY TAKEDOWN [name] HERNIA REPAIR;  Surgeon: Stark Klein, MD;  Location: [facility];  Service: General;  Laterality: N/A;  . INCISIONAL HERNIA REPAIR N/A 03/04/2016   Procedure: LAPAROSCOPIC INCISIONAL HERNIA WITH MESH;  Surgeon: Ralene Ok, MD;  [facility];  Service: General;  Laterality: N/A;  . LAPAROSCOPIC LYSIS OF ADHESIONS N/A 03/04/2016   Procedure: LAPAROSCOPIC LYSIS OF ADHESIONS;  Surgeon: Ralene Ok, MD;  [facility];  Service: General;  Laterality: N/A;  . RIGHT HEART CATH N/A 06/24/2017   Procedure: RIGHT HEART CATH;  Surgeon: Jolaine Artist, MD;  Location: [place] [facility];  Service: Cardiovascular;  Laterality: N/A;  . TONSILLECTOMY  1952    Social History   Socioeconomic History  . Marital status: Divorced    Spouse name: Not on file  . Number of children: 1  . Years of education: 67  . Highest education level: Not on file  Occupational History  . Occupation: Retired    Comment: Animal nutritionist  . Financial resource strain: Not very hard  . Food insecurity:    Worry: Never true    Inability: Never true  . Transportation needs:  Medical: No    Non-medical: No  Tobacco Use  . Smoking status: Former Smoker    Packs/day: 0.50    Years: 52.00    Pack years: 26.00    Types: Cigarettes    Last attempt to quit: 09/06/2012    Years since quitting: 5.5  . Smokeless tobacco: Never Used  . Tobacco comment: never smoked a full PPD.   Substance and Sexual Activity  . Alcohol use: No    Alcohol/week: 0.0 oz    Comment: very rarely  . Drug use: No  . Sexual activity: Not Currently  Lifestyle  . Physical activity:    Days per week: 0 days    Minutes per session: 0 min  . Stress: Very much  Relationships  . Social connections:    Talks on phone: Once a week    Gets together: Once a week      Attends religious service: Not on file    Active member of club or organization: Not on file    Attends meetings of clubs or organizations: Not on file    Relationship status: Not on file  . Intimate partner violence:    Fear of current or ex partner: Not on file    Emotionally abused: Not on file    Physically abused: Not on file    Forced sexual activity: Not on file  Other Topics Concern  . Not on file  Social History Narrative   Regular exercise-no   Caffeine Use-yes    Current Outpatient Medications on File Prior to Visit  Medication Sig Dispense Refill  . acetaminophen (TYLENOL) 500 MG tablet Take 1,000 mg by mouth every 6 (six) hours as needed for headache.    . ALPRAZolam (XANAX) 0.5 MG tablet Take 1 tablet (0.5 mg total) by mouth at bedtime as needed for anxiety. 180 tablet 1  . cholecalciferol (VITAMIN D) 1000 UNITS tablet Take 2,000 Units by mouth daily.     . clobetasol cream (TEMOVATE) 7.03 % Apply 1 application topically 2 (two) times daily. 60 g 2  . escitalopram (LEXAPRO) 10 MG tablet TAKE 1 TABLET DAILY. 90 tablet 1  . Fluticasone Furoate (ARNUITY ELLIPTA) 100 MCG/ACT AEPB Inhale 1 puff into the lungs daily. 30 each 5  . Fluticasone-Umeclidin-Vilant (TRELEGY ELLIPTA) 100-62.5-25 MCG/INH AEPB Inhale 1 puff into the lungs daily. 2 each 0  . levothyroxine (SYNTHROID, LEVOTHROID) 112 MCG tablet TAKE 1 TABLET EVERY DAY 90 tablet 0  . methocarbamol (ROBAXIN) 500 MG tablet Take 1 tablet (500 mg total) by mouth at bedtime as needed for muscle spasms. 30 tablet 0  . rizatriptan (MAXALT-MLT) 10 MG disintegrating tablet Take 1 tablet (10 mg total) by mouth as needed for migraine. May repeat in 2 hours if needed 30 tablet 1  . rosuvastatin (CRESTOR) 10 MG tablet Take 1 tablet (10 mg total) by mouth daily. 90 tablet 1   No current facility-administered medications on file prior to visit.     Allergies  Allergen Reactions  . Estrogens     Blood clot in leg    Family  History  Problem Relation Age of Onset  . Hypertension Mother   . Aneurysm Mother        Cerebral (died from this)  . Cancer Mother        Uterine  . Cancer Other   . Arthritis Sister   . Hyperlipidemia Sister   . Hypertension Sister   . Stroke Sister   . Kidney disease Sister  Cancer  . Hyperlipidemia Brother   . Glaucoma Brother   . Thyroid cancer Daughter   . Breast cancer Maternal Grandmother 78  . Hypertension Maternal Grandmother   . Hypercalcemia Neg Hx     BP 122/80 (BP Location: Left Arm, Patient Position: Sitting, Cuff Size: Normal)   Pulse 80   Wt 209 lb 9.6 oz (95.1 kg)   SpO2 99%   BMI 34.35 kg/m   Review of Systems Denies cramps and falls    Objective:   Physical Exam VITAL SIGNS:  See vs page GENERAL: no distress Gait: normal and steady     Assessment & Plan:  Dizziness, new--? Related to chlorthalidone Hypercalcemia: poss due to chlorthalidone Vit-D deficiency: recheck today.   Patient Instructions  I have sent a prescription to your pharmacy, to reduce the chlorthalidone.   Please come back for a follow-up appointment in 6 weeks.

## 2018-04-07 NOTE — Patient Instructions (Signed)
I have sent a prescription to your pharmacy, to reduce the chlorthalidone.   Please come back for a follow-up appointment in 6 weeks.

## 2018-04-25 ENCOUNTER — Other Ambulatory Visit: Payer: Self-pay | Admitting: Internal Medicine

## 2018-04-25 DIAGNOSIS — E890 Postprocedural hypothyroidism: Secondary | ICD-10-CM

## 2018-05-23 ENCOUNTER — Encounter: Payer: Self-pay | Admitting: Endocrinology

## 2018-05-23 ENCOUNTER — Ambulatory Visit (INDEPENDENT_AMBULATORY_CARE_PROVIDER_SITE_OTHER): Payer: Medicare Other | Admitting: Endocrinology

## 2018-05-23 VITALS — BP 124/84 | HR 90 | Ht 65.5 in | Wt 216.2 lb

## 2018-05-23 DIAGNOSIS — E213 Hyperparathyroidism, unspecified: Secondary | ICD-10-CM

## 2018-05-23 LAB — BASIC METABOLIC PANEL
BUN: 16 mg/dL (ref 6–23)
CO2: 28 mEq/L (ref 19–32)
Calcium: 10.1 mg/dL (ref 8.4–10.5)
Chloride: 107 mEq/L (ref 96–112)
Creatinine, Ser: 1.5 mg/dL — ABNORMAL HIGH (ref 0.40–1.20)
GFR: 43.75 mL/min — ABNORMAL LOW (ref >60.00)
Glucose, Bld: 92 mg/dL (ref 70–99)
Potassium: 4.1 mEq/L (ref 3.5–5.1)
Sodium: 140 mEq/L (ref 135–145)

## 2018-05-23 LAB — VITAMIN D 25 HYDROXY (VIT D DEFICIENCY, FRACTURES): VITD: 51.76 ng/mL (ref 30.00–100.00)

## 2018-05-23 NOTE — Patient Instructions (Addendum)
blood tests are requested for you today.  We'll let you know about the results. If these results are normal, please come back for a follow-up appointment in 6 months.

## 2018-05-23 NOTE — Progress Notes (Signed)
Subjective:    Patient ID: Valerie Jackson, female    DOB: 05/23/1946, 72 y.o.   MRN: [id number]  HPI Pt returns for f/u of hypercalcemia (dx'ed early 2017 (it was normal in 2016); chlorthalidone was continued, as hypercalcemia was mild; no other cause was found).   She also has mild hyperparathyroidism (renal insufficiency could contribute; she did not meet criteria for surgery; she has never had urolithiasis).  Pt returns for f/u of osteoporosis: Dx'ed: 2016 Secondary cause: mild elev PTH, but this is resolved.   Fractures: none Past rx: none Current rx: none Last DEXA result: 2019 (worst T-score was -0.7) Other: pt states she feels well in general. Interval hx: she denies edema  For HTN: She takes aldactone now.   For Vit-D deficiency: she takes OTC supplement, 2000 units/day.     She has moderate cramps in the legs, but no assoc numbness Past Medical History:  Diagnosis Date  . Arthritis    fingers  . Blood clot in vein 1968  . Cataracts, bilateral   . CHF (congestive heart failure) ([place])   . Chronic kidney disease    CKD stage 2  . COPD (chronic obstructive pulmonary disease) ([place])   . Depression   . Diverticulitis 09/2013   with perforation  . Dizziness    with decreased BP readings or bending over  . Floaters   . GERD (gastroesophageal reflux disease)   . H/O hiatal hernia   . Headache(784.0)    migraine every 5- to 6 years  . Heart murmur since age 50   mild  . Hepatitis age 59 or 50   had heapatitis, not sure which kind  . History of blood transfusion 1965  . History of frequent urinary tract infections   . Hypercholesteremia   . Hypertension   . Hypothyroid   . Obesity   . Pericolonic abscess 2015  . Pneumonia   . Sickle cell trait [facility])     Past Surgical History:  Procedure Laterality Date  . CHOLECYSTECTOMY  2002  . COLON RESECTION N/A 10/11/2013   Procedure: EXPLORATORY LAPAROTOMY WITH RECTOSIGMOID COLECTOMY AND END COLOSTOMY;  Surgeon: Stark Klein, MD;  [facility];  Service: General;  Laterality: N/A;  . COLOSTOMY TAKEDOWN N/A 02/27/2014   Procedure: COLOSTOMY TAKEDOWN [name] HERNIA REPAIR;  Surgeon: Stark Klein, MD;  [facility];  Service: General;  Laterality: N/A;  . INCISIONAL HERNIA REPAIR N/A 03/04/2016   Procedure: LAPAROSCOPIC INCISIONAL HERNIA WITH MESH;  Surgeon: Ralene Ok, MD;  [facility];  Service: General;  Laterality: N/A;  . LAPAROSCOPIC LYSIS OF ADHESIONS N/A 03/04/2016   Procedure: LAPAROSCOPIC LYSIS OF ADHESIONS;  Surgeon: Ralene Ok, MD;  [facility];  Service: General;  Laterality: N/A;  . RIGHT HEART CATH N/A 06/24/2017   Procedure: RIGHT HEART CATH;  Surgeon: Jolaine Artist, MD;  Location: [place] [facility] LAB;  Service: Cardiovascular;  Laterality: N/A;  . TONSILLECTOMY  1952    Social History   Socioeconomic History  . Marital status: Divorced    Spouse name: Not on file  . Number of children: 1  . Years of education: 45  . Highest education level: Not on file  Occupational History  . Occupation: Retired    Comment: Animal nutritionist  . Financial resource strain: Not very hard  . Food insecurity:    Worry: Never true    Inability: Never true  . Transportation needs:    Medical: No  Non-medical: No  Tobacco Use  . Smoking status: Former Smoker    Packs/day: 0.50    Years: 52.00    Pack years: 26.00    Types: Cigarettes    Last attempt to quit: 09/06/2012    Years since quitting: 5.7  . Smokeless tobacco: Never Used  . Tobacco comment: never smoked a full PPD.   Substance and Sexual Activity  . Alcohol use: No    Alcohol/week: 0.0 standard drinks    Comment: very rarely  . Drug use: No  . Sexual activity: Not Currently  Lifestyle  . Physical activity:    Days per week: 0 days    Minutes per session: 0 min  . Stress: Very much  Relationships  . Social connections:    Talks on phone: Once a week    Gets together: Once a week    Attends  religious service: Not on file    Active member of club or organization: Not on file    Attends meetings of clubs or organizations: Not on file    Relationship status: Not on file  . Intimate partner violence:    Fear of current or ex partner: Not on file    Emotionally abused: Not on file    Physically abused: Not on file    Forced sexual activity: Not on file  Other Topics Concern  . Not on file  Social History Narrative   Regular exercise-no   Caffeine Use-yes    Current Outpatient Medications on File Prior to Visit  Medication Sig Dispense Refill  . acetaminophen (TYLENOL) 500 MG tablet Take 1,000 mg by mouth every 6 (six) hours as needed for headache.    . ALPRAZolam (XANAX) 0.5 MG tablet Take 1 tablet (0.5 mg total) by mouth at bedtime as needed for anxiety. 180 tablet 1  . cholecalciferol (VITAMIN D) 1000 UNITS tablet Take 2,000 Units by mouth daily.     [name] escitalopram (LEXAPRO) 10 MG tablet TAKE 1 TABLET DAILY. 90 tablet 1  . Fluticasone Furoate (ARNUITY ELLIPTA) 100 MCG/ACT AEPB Inhale 1 puff into the lungs daily. 30 each 5  . Fluticasone-Umeclidin-Vilant (TRELEGY ELLIPTA) 100-62.5-25 MCG/INH AEPB Inhale 1 puff into the lungs daily. 2 each 0  . levothyroxine (SYNTHROID, LEVOTHROID) 112 MCG tablet TAKE 1 TABLET EVERY DAY 90 tablet 0  . methocarbamol (ROBAXIN) 500 MG tablet Take 1 tablet (500 mg total) by mouth at bedtime as needed for muscle spasms. 30 tablet 0  . rizatriptan (MAXALT-MLT) 10 MG disintegrating tablet Take 1 tablet (10 mg total) by mouth as needed for migraine. May repeat in 2 hours if needed 30 tablet 1  . rosuvastatin (CRESTOR) 10 MG tablet Take 1 tablet (10 mg total) by mouth daily. 90 tablet 1  . spironolactone (ALDACTONE) 25 MG tablet Take 0.5 tablets (12.5 mg total) by mouth daily. 45 tablet 1  . clobetasol cream (TEMOVATE) 1.30 % Apply 1 application topically 2 (two) times daily. (Patient not taking: Reported on 05/23/2018) 60 g 2   No current  facility-administered medications on file prior to visit.     Allergies  Allergen Reactions  . Estrogens     Blood clot in leg    Family History  Problem Relation Age of Onset  . Hypertension Mother   . Aneurysm Mother        Cerebral (died from this)  . Cancer Mother        Uterine  . Cancer Other   . Arthritis Sister   .  Hyperlipidemia Sister   . Hypertension Sister   . Stroke Sister   . Kidney disease Sister        Cancer  . Hyperlipidemia Brother   . Glaucoma Brother   . Thyroid cancer Daughter   . Breast cancer Maternal Grandmother 78  . Hypertension Maternal Grandmother   . Hypercalcemia Neg Hx     BP 124/84   Pulse 90   Ht 5' 5.5" (1.664 m)   Wt 216 lb 3.2 oz (98.1 kg)   SpO2 97%   BMI 35.43 kg/m    Review of Systems Denies falls and hematuria.      Objective:   Physical Exam VITAL SIGNS:  See vs page GENERAL: no distress Spine: no kyphosis Gait: normal and steady      Assessment & Plan:  Hypercalcemia: recheck today HTN: well-controlled Hypokalemia: recheck today.  Patient Instructions  blood tests are requested for you today.  We'll let you know about the results. If these results are normal, please come back for a follow-up appointment in 6 months.

## 2018-05-24 LAB — PTH, INTACT AND CALCIUM
Calcium: 10.2 mg/dL (ref 8.6–10.4)
PTH: 50 pg/mL (ref 14–64)

## 2018-06-10 ENCOUNTER — Telehealth: Payer: Self-pay | Admitting: Internal Medicine

## 2018-06-10 DIAGNOSIS — E785 Hyperlipidemia, unspecified: Secondary | ICD-10-CM

## 2018-06-10 MED ORDER — ROSUVASTATIN CALCIUM 10 MG PO TABS: 10.0000 mg | ORAL_TABLET | Freq: Every day | ORAL | 1 refills | Status: DC

## 2018-06-10 NOTE — Telephone Encounter (Signed)
Copied from [place] [id number]. Topic: General - Other >> Jun 10, 2018 11:29 AM Keene Breath wrote: Reason for CRM: Bina with [facility] called to request approval for a new prescription for patient's medication for rosuvastatin (CRESTOR) 10 MG tablet,  Please advise.  CB# [phone].

## 2018-06-10 NOTE — Telephone Encounter (Signed)
Erx has been sent. Sent to [facility] by mistake.  Called [facility] and had them cancel the rx for rosuvastatin.

## 2018-06-13 ENCOUNTER — Other Ambulatory Visit: Payer: Self-pay | Admitting: Internal Medicine

## 2018-06-13 DIAGNOSIS — I10 Essential (primary) hypertension: Secondary | ICD-10-CM

## 2018-06-29 ENCOUNTER — Other Ambulatory Visit: Payer: Self-pay | Admitting: Internal Medicine

## 2018-06-29 DIAGNOSIS — E890 Postprocedural hypothyroidism: Secondary | ICD-10-CM

## 2018-08-24 ENCOUNTER — Other Ambulatory Visit: Payer: Self-pay | Admitting: Endocrinology

## 2018-08-24 DIAGNOSIS — E876 Hypokalemia: Secondary | ICD-10-CM

## 2018-08-24 DIAGNOSIS — I10 Essential (primary) hypertension: Secondary | ICD-10-CM

## 2018-09-05 ENCOUNTER — Other Ambulatory Visit: Payer: Self-pay | Admitting: Internal Medicine

## 2018-09-05 DIAGNOSIS — E890 Postprocedural hypothyroidism: Secondary | ICD-10-CM

## 2018-09-23 ENCOUNTER — Encounter: Payer: Medicare Other | Admitting: Obstetrics & Gynecology

## 2018-09-28 ENCOUNTER — Encounter: Payer: Self-pay | Admitting: Internal Medicine

## 2018-09-28 ENCOUNTER — Ambulatory Visit (INDEPENDENT_AMBULATORY_CARE_PROVIDER_SITE_OTHER): Payer: Medicare Other | Admitting: Internal Medicine

## 2018-09-28 ENCOUNTER — Ambulatory Visit (INDEPENDENT_AMBULATORY_CARE_PROVIDER_SITE_OTHER)
Admission: RE | Admit: 2018-09-28 | Discharge: 2018-09-28 | Disposition: A | Discharge status: Home / Self Care | Payer: Medicare Other | Source: Ambulatory Visit | Attending: Internal Medicine | Admitting: Internal Medicine

## 2018-09-28 ENCOUNTER — Other Ambulatory Visit (INDEPENDENT_AMBULATORY_CARE_PROVIDER_SITE_OTHER): Payer: Medicare Other

## 2018-09-28 VITALS — BP 120/80 | HR 90 | Temp 98.1°F | Ht 65.5 in | Wt 214.0 lb

## 2018-09-28 DIAGNOSIS — G43009 Migraine without aura, not intractable, without status migrainosus: Secondary | ICD-10-CM

## 2018-09-28 DIAGNOSIS — E213 Hyperparathyroidism, unspecified: Secondary | ICD-10-CM

## 2018-09-28 DIAGNOSIS — E039 Hypothyroidism, unspecified: Secondary | ICD-10-CM | POA: Diagnosis not present

## 2018-09-28 DIAGNOSIS — R059 Cough, unspecified: Secondary | ICD-10-CM

## 2018-09-28 DIAGNOSIS — M791 Myalgia, unspecified site: Secondary | ICD-10-CM

## 2018-09-28 DIAGNOSIS — I10 Essential (primary) hypertension: Secondary | ICD-10-CM

## 2018-09-28 DIAGNOSIS — J410 Simple chronic bronchitis: Secondary | ICD-10-CM | POA: Diagnosis not present

## 2018-09-28 DIAGNOSIS — R05 Cough: Secondary | ICD-10-CM

## 2018-09-28 DIAGNOSIS — R0602 Shortness of breath: Secondary | ICD-10-CM | POA: Diagnosis not present

## 2018-09-28 DIAGNOSIS — Z23 Encounter for immunization: Secondary | ICD-10-CM | POA: Diagnosis not present

## 2018-09-28 DIAGNOSIS — N182 Chronic kidney disease, stage 2 (mild): Secondary | ICD-10-CM | POA: Diagnosis not present

## 2018-09-28 LAB — CBC WITH DIFFERENTIAL/PLATELET
Basophils Absolute: 0.1 10*3/uL (ref 0.0–0.1)
Basophils Relative: 0.6 % (ref 0.0–3.0)
Eosinophils Absolute: 0.3 10*3/uL (ref 0.0–0.7)
Eosinophils Relative: 3.4 % (ref 0.0–5.0)
HCT: 40 % (ref 36.0–46.0)
Hemoglobin: 13.4 g/dL (ref 12.0–15.0)
Lymphocytes Relative: 29.9 % (ref 12.0–46.0)
Lymphs Abs: 2.6 10*3/uL (ref 0.7–4.0)
MCHC: 33.6 g/dL (ref 30.0–36.0)
MCV: 87.5 fl (ref 78.0–100.0)
Monocytes Absolute: 0.5 10*3/uL (ref 0.1–1.0)
Monocytes Relative: 6.3 % (ref 3.0–12.0)
Neutro Abs: 5.1 10*3/uL (ref 1.4–7.7)
Neutrophils Relative %: 59.8 % (ref 43.0–77.0)
Platelets: 216 10*3/uL (ref 150.0–400.0)
RBC: 4.57 Mil/uL (ref 3.87–5.11)
RDW: 14.1 % (ref 11.5–15.5)
WBC: 8.6 10*3/uL (ref 4.0–10.5)

## 2018-09-28 LAB — BASIC METABOLIC PANEL
BUN: 13 mg/dL (ref 6–23)
CO2: 26 mEq/L (ref 19–32)
Calcium: 10.3 mg/dL (ref 8.4–10.5)
Chloride: 105 mEq/L (ref 96–112)
Creatinine, Ser: 1.6 mg/dL — ABNORMAL HIGH (ref 0.40–1.20)
GFR: 40.57 mL/min — ABNORMAL LOW (ref >60.00)
Glucose, Bld: 94 mg/dL (ref 70–99)
Potassium: 4.1 mEq/L (ref 3.5–5.1)
Sodium: 139 mEq/L (ref 135–145)

## 2018-09-28 LAB — URINALYSIS, ROUTINE W REFLEX MICROSCOPIC
Bilirubin Urine: NEGATIVE
Ketones, ur: NEGATIVE
Leukocytes, UA: NEGATIVE
Nitrite: NEGATIVE
Specific Gravity, Urine: 1.01 (ref 1.000–1.030)
Total Protein, Urine: 30 — AB
Urine Glucose: NEGATIVE
Urobilinogen, UA: 0.2 (ref 0.0–1.0)
pH: 6 (ref 5.0–8.0)

## 2018-09-28 LAB — CK: Total CK: 44 U/L (ref 7–177)

## 2018-09-28 LAB — TSH: TSH: 0.36 u[IU]/mL (ref 0.35–4.50)

## 2018-09-28 MED ORDER — RIZATRIPTAN BENZOATE 10 MG PO TBDP: 10.0000 mg | ORAL_TABLET | ORAL | 1 refills | Status: DC | PRN

## 2018-09-28 MED ORDER — METHOCARBAMOL 500 MG PO TABS: 500.0000 mg | ORAL_TABLET | Freq: Three times a day (TID) | ORAL | 3 refills | Status: DC | PRN

## 2018-09-28 MED ORDER — FLUTICASONE-UMECLIDIN-VILANT 100-62.5-25 MCG/INH IN AEPB: 1.0000 | INHALATION_SPRAY | Freq: Every day | RESPIRATORY_TRACT | 1 refills | Status: DC

## 2018-09-28 NOTE — Patient Instructions (Signed)
Hypothyroidism  Hypothyroidism is when the thyroid gland does not make enough of certain hormones (it is underactive). The thyroid gland is a small gland located in the lower front part of the neck, just in front of the windpipe (trachea). This gland makes hormones that help control how the body uses food for energy (metabolism) as well as how the heart and brain function. These hormones also play a role in keeping your bones strong. When the thyroid is underactive, it produces too little of the hormones thyroxine (T4) and triiodothyronine (T3). What are the causes? This condition may be caused by:  Hashimoto's disease. This is a disease in which the body's disease-fighting system (immune system) attacks the thyroid gland. This is the most common cause.  Viral infections.  Pregnancy.  Certain medicines.  Birth defects.  Past radiation treatments to the head or neck for cancer.  Past treatment with radioactive iodine.  Past exposure to radiation in the environment.  Past surgical removal of part or all of the thyroid.  Problems with a gland in the center of the brain (pituitary gland).  Lack of enough iodine in the diet. What increases the risk? You are more likely to develop this condition if:  You are female.  You have a family history of thyroid conditions.  You use a medicine called lithium.  You take medicines that affect the immune system (immunosuppressants). What are the signs or symptoms? Symptoms of this condition include:  Feeling as though you have no energy (lethargy).  Not being able to tolerate cold.  Weight gain that is not explained by a change in diet or exercise habits.  Lack of appetite.  Dry skin.  Coarse hair.  Menstrual irregularity.  Slowing of thought processes.  Constipation.  Sadness or depression. How is this diagnosed? This condition may be diagnosed based on:  Your symptoms, your medical history, and a physical exam.  Blood  tests. You may also have imaging tests, such as an ultrasound or MRI. How is this treated? This condition is treated with medicine that replaces the thyroid hormones that your body does not make. After you begin treatment, it may take several weeks for symptoms to go away. Follow these instructions at home:  Take over-the-counter and prescription medicines only as told by your health care provider.  If you start taking any new medicines, tell your health care provider.  Keep all follow-up visits as told by your health care provider. This is important. ? As your condition improves, your dosage of thyroid hormone medicine may change. ? You will need to have blood tests regularly so that your health care provider can monitor your condition. Contact a health care provider if:  Your symptoms do not get better with treatment.  You are taking thyroid replacement medicine and you: ? Sweat a lot. ? Have tremors. ? Feel anxious. ? Lose weight rapidly. ? Cannot tolerate heat. ? Have emotional swings. ? Have diarrhea. ? Feel weak. Get help right away if you have:  Chest pain.  An irregular heartbeat.  A rapid heartbeat.  Difficulty breathing. Summary  Hypothyroidism is when the thyroid gland does not make enough of certain hormones (it is underactive).  When the thyroid is underactive, it produces too little of the hormones thyroxine (T4) and triiodothyronine (T3).  The most common cause is Hashimoto's disease, a disease in which the body's disease-fighting system (immune system) attacks the thyroid gland. The condition can also be caused by viral infections, medicine, pregnancy, or past  radiation treatment to the head or neck.  Symptoms may include weight gain, dry skin, constipation, feeling as though you do not have energy, and not being able to tolerate cold.  This condition is treated with medicine to replace the thyroid hormones that your body does not make. This information  is not intended to replace advice given to you by your health care provider. Make sure you discuss any questions you have with your health care provider. Document Released: 08/31/2005 Document Revised: 08/11/2017 Document Reviewed: 08/11/2017 Elsevier Interactive Patient Education  2019 [employer].

## 2018-09-28 NOTE — Progress Notes (Signed)
Subjective:  Patient ID: [name], female    DOB: 1946/07/12  Age: 73 y.o. MRN: [id number]  CC: Hypertension   HPI [name] presents for f/up - She complains of intermittent spasms and muscle cramps in her extremities - more in the lower extremity than the upper extremity.  She does not know if the statin makes this worse.  She gets symptom relief with the occasional dose of a muscle relaxer.  She also complains of a recurrence of shortness of breath.  She had been using an inhaler but she ran out about a month ago.  She denies cough, hemoptysis, wheezing, or chest pain.  Outpatient Medications Prior to Visit  Medication Sig Dispense Refill  . acetaminophen (TYLENOL) 500 MG tablet Take 1,000 mg by mouth every 6 (six) hours as needed for headache.    . ALPRAZolam (XANAX) 0.5 MG tablet Take 1 tablet (0.5 mg total) by mouth at bedtime as needed for anxiety. 180 tablet 1  . cholecalciferol (VITAMIN D) 1000 UNITS tablet Take 2,000 Units by mouth daily.     [name] levothyroxine (SYNTHROID, LEVOTHROID) 112 MCG tablet Take 1 tablet (112 mcg total) by mouth daily. 90 tablet 0  . spironolactone (ALDACTONE) 25 MG tablet TAKE 1/2 TABLET EVERY DAY 45 tablet 0  . Fluticasone Furoate (ARNUITY ELLIPTA) 100 MCG/ACT AEPB Inhale 1 puff into the lungs daily. 30 each 5  . Fluticasone-Umeclidin-Vilant (TRELEGY ELLIPTA) 100-62.5-25 MCG/INH AEPB Inhale 1 puff into the lungs daily. 2 each 0  . methocarbamol (ROBAXIN) 500 MG tablet Take 1 tablet (500 mg total) by mouth at bedtime as needed for muscle spasms. 30 tablet 0  . rizatriptan (MAXALT-MLT) 10 MG disintegrating tablet Take 1 tablet (10 mg total) by mouth as needed for migraine. May repeat in 2 hours if needed 30 tablet 1  . rosuvastatin (CRESTOR) 10 MG tablet Take 1 tablet (10 mg total) by mouth daily. 90 tablet 1  . clobetasol cream (TEMOVATE) 9.70 % Apply 1 application topically 2 (two) times daily. (Patient not taking: Reported on 05/23/2018) 60 g 2    . escitalopram (LEXAPRO) 10 MG tablet TAKE 1 TABLET DAILY. 90 tablet 1   No facility-administered medications prior to visit.     ROS Review of Systems  Constitutional: Negative.  Negative for diaphoresis and fatigue.  HENT: Negative.  Negative for sore throat and trouble swallowing.   Eyes: Negative.   Respiratory: Positive for shortness of breath. Negative for cough, chest tightness and wheezing.   Cardiovascular: Negative for chest pain, palpitations and leg swelling.  Gastrointestinal: Negative for abdominal pain, constipation, diarrhea, nausea and vomiting.  Endocrine: Negative.   Genitourinary: Negative.  Negative for difficulty urinating and hematuria.  Musculoskeletal: Positive for myalgias. Negative for arthralgias.  Skin: Negative.  Negative for color change.  Neurological: Negative.  Negative for dizziness, weakness, light-headedness and headaches.  Hematological: Negative for adenopathy. Does not bruise/bleed easily.  Psychiatric/Behavioral: Negative.     Objective:  BP 120/80 (BP Location: Left Arm, Patient Position: Sitting, Cuff Size: Large)   Pulse 90   Temp 98.1 F (36.7 C) (Oral)   Ht 5' 5.5" (1.664 m)   Wt 214 lb (97.1 kg)   SpO2 98%   BMI 35.07 kg/m   BP Readings from Last 3 Encounters:  09/28/18 120/80  05/23/18 124/84  04/07/18 122/80    Wt Readings from Last 3 Encounters:  09/28/18 214 lb (97.1 kg)  05/23/18 216 lb 3.2 oz (98.1 kg)  04/07/18 209 lb  9.6 oz (95.1 kg)    Physical Exam Vitals signs reviewed.  Constitutional:      General: She is not in acute distress.    Appearance: She is obese. She is not ill-appearing, toxic-appearing or diaphoretic.  HENT:     Nose: Nose normal. No congestion or rhinorrhea.     Mouth/Throat:     Mouth: Mucous membranes are moist.     Pharynx: Oropharynx is clear. No oropharyngeal exudate or posterior oropharyngeal erythema.  Eyes:     General: No scleral icterus.    Conjunctiva/sclera: Conjunctivae  normal.  Neck:     Musculoskeletal: Normal range of motion and neck supple. No muscular tenderness.  Cardiovascular:     Rate and Rhythm: Normal rate and regular rhythm.     Heart sounds: No murmur. No gallop.   Pulmonary:     Effort: No respiratory distress.     Breath sounds: No stridor. No wheezing, rhonchi or rales.  Chest:     Chest wall: No tenderness.  Abdominal:     General: Bowel sounds are normal.     Palpations: There is no mass.     Tenderness: There is no abdominal tenderness. There is no guarding.  Musculoskeletal: Normal range of motion.        General: No swelling.     Right lower leg: No edema.     Left lower leg: No edema.  Lymphadenopathy:     Cervical: No cervical adenopathy.  Skin:    General: Skin is warm and dry.  Neurological:     General: No focal deficit present.     Mental Status: She is oriented to person, place, and time. Mental status is at baseline.  Psychiatric:        Mood and Affect: Mood normal.        Behavior: Behavior normal.        Thought Content: Thought content normal.        Judgment: Judgment normal.     Lab Results  Component Value Date   WBC 8.6 09/28/2018   HGB 13.4 09/28/2018   HCT 40.0 09/28/2018   PLT 216.0 09/28/2018   GLUCOSE 94 09/28/2018   CHOL 211 (H) 12/14/2017   TRIG 131.0 12/14/2017   HDL 53.70 12/14/2017   LDLDIRECT 226.0 02/27/2013   LDLCALC 131 (H) 12/14/2017   ALT 19 03/18/2018   AST 17 03/18/2018   NA 139 09/28/2018   K 4.1 09/28/2018   CL 105 09/28/2018   CREATININE 1.60 (H) 09/28/2018   BUN 13 09/28/2018   CO2 26 09/28/2018   TSH 0.36 09/28/2018   INR 1.02 06/24/2017   HGBA1C 4.7 05/14/2014    Mm Screening Breast Tomo Bilateral  Result Date: 12/21/2017 CLINICAL DATA:  Screening. EXAM: DIGITAL SCREENING BILATERAL MAMMOGRAM WITH TOMO AND CAD COMPARISON:  Previous exam(s). ACR Breast Density Category b: There are scattered areas of fibroglandular density. FINDINGS: There are no findings  suspicious for malignancy. Images were processed with CAD. IMPRESSION: No mammographic evidence of malignancy. A result letter of this screening mammogram will be mailed directly to the patient. RECOMMENDATION: Screening mammogram in one year. (Code:SM-B-01Y) BI-RADS CATEGORY  1: Negative. Electronically Signed   By: Lajean Manes M.D.   On: 12/21/2017 16:52    Assessment & Plan:   [name] was seen today for hypertension.  Diagnoses and all orders for this visit:  Need for influenza vaccination -     Flu vaccine HIGH DOSE PF (Fluzone High dose)  Migraine  without aura and without status migrainosus, not intractable- Her headaches are well controlled. -     rizatriptan (MAXALT-MLT) 10 MG disintegrating tablet; Take 1 tablet (10 mg total) by mouth as needed for migraine. May repeat in 2 hours if needed  Simple chronic bronchitis ([place])- She has not been compliant with Trelegy over this last month and her symptoms have recurred.  I have asked her to restart the triple inhaler. -     Fluticasone-Umeclidin-Vilant (TRELEGY ELLIPTA) 100-62.5-25 MCG/INH AEPB; Inhale 1 puff into the lungs daily.  Myalgia- She complains of muscle aches but her CK level is normal.  I have asked her to stop taking the statin for the next few weeks and to let me know if her symptoms improve.  In the meantime will continue the muscle relaxer as needed. -     methocarbamol (ROBAXIN) 500 MG tablet; Take 1 tablet (500 mg total) by mouth every 8 (eight) hours as needed for muscle spasms. -     CK; Future  Hyperparathyroidism ([place])- Her calcium level is normal now. -     Basic metabolic panel; Future  Acquired hypothyroidism- Her TSH is in the normal range.  She will remain on the current dose of levothyroxine. -     TSH; Future  Essential hypertension- Her blood pressure is well controlled.  Electrolytes are normal.  Her renal function has declined so I have asked her to see nephrology for evaluation and treatment  options. -     Basic metabolic panel; Future -     CBC with Differential/Platelet; Future  CKD (chronic kidney disease) stage 2, GFR 60-89 ml/min -     CBC with Differential/Platelet; Future -     Urinalysis, Routine w reflex microscopic; Future -     Ambulatory referral to Nephrology  Cough- Her chest x-ray is negative for mass or infiltrate. -     DG Chest 2 View; Future   I have discontinued [name] R. Flow's clobetasol cream, Fluticasone Furoate, escitalopram, and rosuvastatin. I have also changed her methocarbamol. Additionally, I am having her maintain her cholecalciferol, acetaminophen, ALPRAZolam, spironolactone, levothyroxine, Fluticasone-Umeclidin-Vilant, and rizatriptan.  Meds ordered this encounter  Medications  . Fluticasone-Umeclidin-Vilant (TRELEGY ELLIPTA) 100-62.5-25 MCG/INH AEPB    Sig: Inhale 1 puff into the lungs daily.    Dispense:  90 each    Refill:  1    Order Specific Question:   Lot Number?    Answer:   [id number]    Order Specific Question:   Expiration Date?    Answer:   07/15/2019    Order Specific Question:   Manufacturer?    Answer:   [employer] [12]    Order Specific Question:   Quantity    Answer:   2  . rizatriptan (MAXALT-MLT) 10 MG disintegrating tablet    Sig: Take 1 tablet (10 mg total) by mouth as needed for migraine. May repeat in 2 hours if needed    Dispense:  30 tablet    Refill:  1  . methocarbamol (ROBAXIN) 500 MG tablet    Sig: Take 1 tablet (500 mg total) by mouth every 8 (eight) hours as needed for muscle spasms.    Dispense:  60 tablet    Refill:  3     Follow-up: Return in about 3 months (around 12/28/2018).  [name], MD

## 2018-09-29 ENCOUNTER — Encounter: Payer: Self-pay | Admitting: Internal Medicine

## 2018-10-31 ENCOUNTER — Other Ambulatory Visit: Payer: Self-pay | Admitting: Endocrinology

## 2018-10-31 DIAGNOSIS — E876 Hypokalemia: Secondary | ICD-10-CM

## 2018-10-31 DIAGNOSIS — I10 Essential (primary) hypertension: Secondary | ICD-10-CM

## 2018-11-02 ENCOUNTER — Other Ambulatory Visit: Payer: Self-pay | Admitting: Pulmonary Disease

## 2018-11-21 ENCOUNTER — Encounter: Payer: Self-pay | Admitting: Endocrinology

## 2018-11-21 ENCOUNTER — Other Ambulatory Visit: Payer: Self-pay

## 2018-11-21 ENCOUNTER — Ambulatory Visit (INDEPENDENT_AMBULATORY_CARE_PROVIDER_SITE_OTHER): Payer: Medicare Other | Admitting: Endocrinology

## 2018-11-21 VITALS — BP 124/82 | HR 85 | Ht 65.5 in | Wt 218.0 lb

## 2018-11-21 DIAGNOSIS — R252 Cramp and spasm: Secondary | ICD-10-CM | POA: Diagnosis not present

## 2018-11-21 DIAGNOSIS — E213 Hyperparathyroidism, unspecified: Secondary | ICD-10-CM | POA: Diagnosis not present

## 2018-11-21 DIAGNOSIS — R200 Anesthesia of skin: Secondary | ICD-10-CM | POA: Diagnosis not present

## 2018-11-21 DIAGNOSIS — E538 Deficiency of other specified B group vitamins: Secondary | ICD-10-CM | POA: Insufficient documentation

## 2018-11-21 LAB — VITAMIN D 25 HYDROXY (VIT D DEFICIENCY, FRACTURES): VITD: 49.6 ng/mL (ref 30.00–100.00)

## 2018-11-21 LAB — VITAMIN B12: Vitamin B-12: 164 pg/mL — ABNORMAL LOW (ref 211–911)

## 2018-11-21 LAB — MAGNESIUM: Magnesium: 2.1 mg/dL (ref 1.5–2.5)

## 2018-11-21 NOTE — Progress Notes (Signed)
Subjective:    Patient ID: Valerie Jackson, female    DOB: 1946-02-17, 73 y.o.   MRN: [id number]  HPI Pt returns for f/u of hypercalcemia (dx'ed early 2017 (it was normal in 2016); chlorthalidone was continued, as hypercalcemia was mild; no other cause was found).   She also has mild hyperparathyroidism (renal insufficiency could contribute; she did not meet criteria for surgery; she has never had urolithiasis).  Pt returns for f/u of osteoporosis: Dx'ed: 2016 Secondary cause: mild elev PTH, but this is resolved.   Fractures: left 5th toe (1970), with an injury.  Past rx: none Current rx: none Last DEXA result: 1/19 (worst T-score was -0.7) Other: pt states she feels well in general. Interval hx: she denies edema  For HTN: She takes aldactone now.   For Vit-D deficiency: she takes OTC supplement, 2000 units/day.   She has slight numbness in the legs, and assoc fatigue.  Past Medical History:  Diagnosis Date  . Arthritis    fingers  . Blood clot in vein 1968  . Cataracts, bilateral   . CHF (congestive heart failure) ([place])   . Chronic kidney disease    CKD stage 2  . COPD (chronic obstructive pulmonary disease) ([place])   . Depression   . Diverticulitis 09/2013   with perforation  . Dizziness    with decreased BP readings or bending over  . Floaters   . GERD (gastroesophageal reflux disease)   . H/O hiatal hernia   . Headache(784.0)    migraine every 5- to 6 years  . Heart murmur since age 42   mild  . Hepatitis age 39 or 40   had heapatitis, not sure which kind  . History of blood transfusion 1965  . History of frequent urinary tract infections   . Hypercholesteremia   . Hypertension   . Hypothyroid   . Obesity   . Pericolonic abscess 2015  . Pneumonia   . Sickle cell trait [facility])     Past Surgical History:  Procedure Laterality Date  . CHOLECYSTECTOMY  2002  . COLON RESECTION N/A 10/11/2013   Procedure: EXPLORATORY LAPAROTOMY WITH RECTOSIGMOID COLECTOMY AND END  COLOSTOMY;  Surgeon: Stark Klein, MD;  Location: [facility];  Service: General;  Laterality: N/A;  . COLOSTOMY TAKEDOWN N/A 02/27/2014   Procedure: COLOSTOMY TAKEDOWN [name] HERNIA REPAIR;  Surgeon: Stark Klein, MD;  Location: [facility];  Service: General;  Laterality: N/A;  . INCISIONAL HERNIA REPAIR N/A 03/04/2016   Procedure: LAPAROSCOPIC INCISIONAL HERNIA WITH MESH;  Surgeon: Ralene Ok, MD;  Location: [facility];  Service: General;  Laterality: N/A;  . LAPAROSCOPIC LYSIS OF ADHESIONS N/A 03/04/2016   Procedure: LAPAROSCOPIC LYSIS OF ADHESIONS;  Surgeon: Ralene Ok, MD;  Location: [facility];  Service: General;  Laterality: N/A;  . RIGHT HEART CATH N/A 06/24/2017   Procedure: RIGHT HEART CATH;  Surgeon: Jolaine Artist, MD;  Location: [facility];  Service: Cardiovascular;  Laterality: N/A;  . TONSILLECTOMY  1952    Social History   Socioeconomic History  . Marital status: Divorced    Spouse name: Not on file  . Number of children: 1  . Years of education: 57  . Highest education level: Not on file  Occupational History  . Occupation: Retired    Comment: Animal nutritionist  . Financial resource strain: Not very hard  . Food insecurity:    Worry: Never true    Inability: Never true  . Transportation needs:  Medical: No    Non-medical: No  Tobacco Use  . Smoking status: Former Smoker    Packs/day: 0.50    Years: 52.00    Pack years: 26.00    Types: Cigarettes    Last attempt to quit: 09/06/2012    Years since quitting: 6.2  . Smokeless tobacco: Never Used  . Tobacco comment: never smoked a full PPD.   Substance and Sexual Activity  . Alcohol use: No    Alcohol/week: 0.0 standard drinks    Comment: very rarely  . Drug use: No  . Sexual activity: Not Currently  Lifestyle  . Physical activity:    Days per week: 0 days    Minutes per session: 0 min  . Stress: Very much  Relationships  . Social connections:    Talks on phone: Once a week    Gets together:  Once a week    Attends religious service: Not on file    Active member of club or organization: Not on file    Attends meetings of clubs or organizations: Not on file    Relationship status: Not on file  . Intimate partner violence:    Fear of current or ex partner: Not on file    Emotionally abused: Not on file    Physically abused: Not on file    Forced sexual activity: Not on file  Other Topics Concern  . Not on file  Social History Narrative   Regular exercise-no   Caffeine Use-yes    Current Outpatient Medications on File Prior to Visit  Medication Sig Dispense Refill  . acetaminophen (TYLENOL) 500 MG tablet Take 1,000 mg by mouth every 6 (six) hours as needed for headache.    . ALPRAZolam (XANAX) 0.5 MG tablet Take 1 tablet (0.5 mg total) by mouth at bedtime as needed for anxiety. 180 tablet 1  . ARNUITY ELLIPTA 100 MCG/ACT AEPB INHALE 1 PUFF INTO THE LUNGS DAILY. 90 each 1  . Calcium Carbonate-Vit D-Min (CALCIUM 1200 PO) Take 1 tablet by mouth daily.    . cholecalciferol (VITAMIN D) 1000 UNITS tablet Take 2,000 Units by mouth daily.     . Magnesium 100 MG CAPS Take 1 capsule by mouth daily.    . methocarbamol (ROBAXIN) 500 MG tablet Take 1 tablet (500 mg total) by mouth every 8 (eight) hours as needed for muscle spasms. 60 tablet 3  . rizatriptan (MAXALT-MLT) 10 MG disintegrating tablet Take 1 tablet (10 mg total) by mouth as needed for migraine. May repeat in 2 hours if needed 30 tablet 1  . spironolactone (ALDACTONE) 25 MG tablet TAKE 1/2 TABLET EVERY DAY 45 tablet 0   No current facility-administered medications on file prior to visit.     Allergies  Allergen Reactions  . Estrogens     Blood clot in leg    Family History  Problem Relation Age of Onset  . Hypertension Mother   . Aneurysm Mother        Cerebral (died from this)  . Cancer Mother        Uterine  . Cancer Other   . Arthritis Sister   . Hyperlipidemia Sister   . Hypertension Sister   . Stroke  Sister   . Kidney disease Sister        Cancer  . Hyperlipidemia Brother   . Glaucoma Brother   . Thyroid cancer Daughter   . Breast cancer Maternal Grandmother 78  . Hypertension Maternal Grandmother   . Hypercalcemia Neg  Hx     BP 124/82 (BP Location: Right Arm, Patient Position: Sitting, Cuff Size: Large)   Pulse 85   Ht 5' 5.5" (1.664 m)   Wt 218 lb (98.9 kg)   SpO2 94%   BMI 35.73 kg/m   Review of Systems She has muscle cramps, but no falls    Objective:   Physical Exam VITAL SIGNS:  See vs page GENERAL: no distress Gait: normal and steady   Lab Results  Component Value Date   TSH 0.36 09/28/2018   T3TOTAL <10.0 (L) 02/27/2013   Vit B-12=low.  25-HO vit-D=normal    Assessment & Plan:  HTN: well-controlled Vit B-12 def: new Vit-D def: well-replaced  Patient Instructions  Blood tests are requested for you today.  We'll let you know about the results.  Please come back for a follow-up appointment in 1 year.

## 2018-11-21 NOTE — Patient Instructions (Signed)
Blood tests are requested for you today.  We'll let you know about the results.  Please come back for a follow-up appointment in 1 year.

## 2018-11-22 ENCOUNTER — Other Ambulatory Visit: Payer: Self-pay | Admitting: Internal Medicine

## 2018-11-22 DIAGNOSIS — E890 Postprocedural hypothyroidism: Secondary | ICD-10-CM

## 2018-11-22 LAB — PTH, INTACT AND CALCIUM
Calcium: 10 mg/dL (ref 8.6–10.4)
PTH: 36 pg/mL (ref 14–64)

## 2019-01-16 ENCOUNTER — Ambulatory Visit (INDEPENDENT_AMBULATORY_CARE_PROVIDER_SITE_OTHER): Payer: Medicare Other | Admitting: Internal Medicine

## 2019-01-16 ENCOUNTER — Other Ambulatory Visit (INDEPENDENT_AMBULATORY_CARE_PROVIDER_SITE_OTHER): Payer: Medicare Other

## 2019-01-16 ENCOUNTER — Ambulatory Visit (INDEPENDENT_AMBULATORY_CARE_PROVIDER_SITE_OTHER)
Admission: RE | Admit: 2019-01-16 | Discharge: 2019-01-16 | Disposition: A | Discharge status: Home / Self Care | Payer: Medicare Other | Source: Ambulatory Visit | Attending: Internal Medicine | Admitting: Internal Medicine

## 2019-01-16 ENCOUNTER — Ambulatory Visit: Payer: Medicare Other | Admitting: Internal Medicine

## 2019-01-16 ENCOUNTER — Encounter: Payer: Self-pay | Admitting: Internal Medicine

## 2019-01-16 ENCOUNTER — Other Ambulatory Visit: Payer: Self-pay

## 2019-01-16 ENCOUNTER — Ambulatory Visit: Payer: Self-pay

## 2019-01-16 VITALS — BP 130/70 | HR 96 | Temp 98.6°F | Ht 65.5 in | Wt 215.0 lb

## 2019-01-16 DIAGNOSIS — R0781 Pleurodynia: Secondary | ICD-10-CM

## 2019-01-16 DIAGNOSIS — R109 Unspecified abdominal pain: Secondary | ICD-10-CM | POA: Diagnosis not present

## 2019-01-16 DIAGNOSIS — N182 Chronic kidney disease, stage 2 (mild): Secondary | ICD-10-CM

## 2019-01-16 DIAGNOSIS — E039 Hypothyroidism, unspecified: Secondary | ICD-10-CM | POA: Diagnosis not present

## 2019-01-16 DIAGNOSIS — E538 Deficiency of other specified B group vitamins: Secondary | ICD-10-CM

## 2019-01-16 DIAGNOSIS — E213 Hyperparathyroidism, unspecified: Secondary | ICD-10-CM

## 2019-01-16 DIAGNOSIS — R7989 Other specified abnormal findings of blood chemistry: Secondary | ICD-10-CM | POA: Diagnosis not present

## 2019-01-16 DIAGNOSIS — R300 Dysuria: Secondary | ICD-10-CM | POA: Insufficient documentation

## 2019-01-16 DIAGNOSIS — E785 Hyperlipidemia, unspecified: Secondary | ICD-10-CM | POA: Diagnosis not present

## 2019-01-16 DIAGNOSIS — R071 Chest pain on breathing: Secondary | ICD-10-CM

## 2019-01-16 LAB — CBC WITH DIFFERENTIAL/PLATELET
Basophils Absolute: 0.1 10*3/uL (ref 0.0–0.1)
Basophils Relative: 0.9 % (ref 0.0–3.0)
Eosinophils Absolute: 0.3 10*3/uL (ref 0.0–0.7)
Eosinophils Relative: 2.8 % (ref 0.0–5.0)
HCT: 39.4 % (ref 36.0–46.0)
Hemoglobin: 13.5 g/dL (ref 12.0–15.0)
Lymphocytes Relative: 31.7 % (ref 12.0–46.0)
Lymphs Abs: 2.9 10*3/uL (ref 0.7–4.0)
MCHC: 34.3 g/dL (ref 30.0–36.0)
MCV: 88.5 fl (ref 78.0–100.0)
Monocytes Absolute: 0.5 10*3/uL (ref 0.1–1.0)
Monocytes Relative: 5 % (ref 3.0–12.0)
Neutro Abs: 5.4 10*3/uL (ref 1.4–7.7)
Neutrophils Relative %: 59.6 % (ref 43.0–77.0)
Platelets: 242 10*3/uL (ref 150.0–400.0)
RBC: 4.45 Mil/uL (ref 3.87–5.11)
RDW: 13.7 % (ref 11.5–15.5)
WBC: 9.1 10*3/uL (ref 4.0–10.5)

## 2019-01-16 LAB — URINALYSIS, ROUTINE W REFLEX MICROSCOPIC
Bilirubin Urine: NEGATIVE
Ketones, ur: NEGATIVE
Nitrite: NEGATIVE
RBC / HPF: NONE SEEN (ref 0–?)
Specific Gravity, Urine: 1.01 (ref 1.000–1.030)
Total Protein, Urine: 30 — AB
Urine Glucose: NEGATIVE
Urobilinogen, UA: 0.2 (ref 0.0–1.0)
pH: 6 (ref 5.0–8.0)

## 2019-01-16 LAB — COMPREHENSIVE METABOLIC PANEL
ALT: 23 U/L (ref 0–35)
AST: 17 U/L (ref 0–37)
Albumin: 4.4 g/dL (ref 3.5–5.2)
Alkaline Phosphatase: 83 U/L (ref 39–117)
BUN: 15 mg/dL (ref 6–23)
CO2: 26 mEq/L (ref 19–32)
Calcium: 10 mg/dL (ref 8.4–10.5)
Chloride: 103 mEq/L (ref 96–112)
Creatinine, Ser: 1.61 mg/dL — ABNORMAL HIGH (ref 0.40–1.20)
GFR: 37.86 mL/min — ABNORMAL LOW (ref >60.00)
Glucose, Bld: 112 mg/dL — ABNORMAL HIGH (ref 70–99)
Potassium: 3.4 mEq/L — ABNORMAL LOW (ref 3.5–5.1)
Sodium: 139 mEq/L (ref 135–145)
Total Bilirubin: 0.8 mg/dL (ref 0.2–1.2)
Total Protein: 8 g/dL (ref 6.0–8.3)

## 2019-01-16 LAB — LIPID PANEL
Cholesterol: 210 mg/dL — ABNORMAL HIGH (ref 0–200)
HDL: 47.6 mg/dL (ref >39.00)
LDL Cholesterol: 130 mg/dL — ABNORMAL HIGH (ref 0–99)
NonHDL: 161.96
Total CHOL/HDL Ratio: 4
Triglycerides: 160 mg/dL — ABNORMAL HIGH (ref 0.0–149.0)
VLDL: 32 mg/dL (ref 0.0–40.0)

## 2019-01-16 LAB — VITAMIN B12: Vitamin B-12: 167 pg/mL — ABNORMAL LOW (ref 211–911)

## 2019-01-16 NOTE — Progress Notes (Signed)
Subjective:  Patient ID: [name], female    DOB: 1946-02-11  Age: 73 y.o. MRN: [id number]  CC: Abdominal Pain   HPI [name] presents for the complaint of a one-week history of left rib cage, left flank, and left upper abdominal pain.  She describes it is a sharp, intermittent pain that is worsened with palpation and inspiration.  She has gotten some symptom relief with ibuprofen.  She also complains of fatigue and cloudy urine but she denies dysuria or hematuria.  She denies nausea, vomiting, rash, diarrhea, constipation, or loss of appetite.  Outpatient Medications Prior to Visit  Medication Sig Dispense Refill   acetaminophen (TYLENOL) 500 MG tablet Take 1,000 mg by mouth every 6 (six) hours as needed for headache.     ALPRAZolam (XANAX) 0.5 MG tablet Take 1 tablet (0.5 mg total) by mouth at bedtime as needed for anxiety. 180 tablet 1   ARNUITY ELLIPTA 100 MCG/ACT AEPB INHALE 1 PUFF INTO THE LUNGS DAILY. 90 each 1   Calcium Carbonate-Vit D-Min (CALCIUM 1200 PO) Take 1 tablet by mouth daily.     cholecalciferol (VITAMIN D) 1000 UNITS tablet Take 2,000 Units by mouth daily.      levothyroxine (SYNTHROID, LEVOTHROID) 112 MCG tablet TAKE 1 TABLET EVERY DAY 90 tablet 0   Magnesium 100 MG CAPS Take 1 capsule by mouth daily.     methocarbamol (ROBAXIN) 500 MG tablet Take 1 tablet (500 mg total) by mouth every 8 (eight) hours as needed for muscle spasms. 60 tablet 3   rizatriptan (MAXALT-MLT) 10 MG disintegrating tablet Take 1 tablet (10 mg total) by mouth as needed for migraine. May repeat in 2 hours if needed 30 tablet 1   spironolactone (ALDACTONE) 25 MG tablet TAKE 1/2 TABLET EVERY DAY 45 tablet 0   No facility-administered medications prior to visit.     ROS Review of Systems  Constitutional: Positive for fatigue. Negative for appetite change, chills, diaphoresis and fever.  HENT: Negative.  Negative for sore throat.   Eyes: Negative for visual disturbance.    Respiratory: Negative for cough, chest tightness, shortness of breath and wheezing.   Cardiovascular: Positive for chest pain. Negative for palpitations and leg swelling.  Gastrointestinal: Positive for abdominal pain. Negative for constipation, diarrhea, nausea and vomiting.  Genitourinary: Positive for flank pain. Negative for decreased urine volume, difficulty urinating, dysuria, frequency, hematuria, urgency, vaginal bleeding and vaginal discharge.  Musculoskeletal: Negative for arthralgias and myalgias.  Skin: Negative.  Negative for color change and pallor.  Neurological: Negative.  Negative for dizziness, weakness and numbness.  Hematological: Negative for adenopathy. Does not bruise/bleed easily.  Psychiatric/Behavioral: Negative.     Objective:  BP 130/70 (BP Location: Left Arm, Patient Position: Sitting, Cuff Size: Large)    Pulse 96    Temp 98.6 F (37 C) (Oral)    Ht 5' 5.5" (1.664 m)    Wt 215 lb (97.5 kg)    SpO2 94%    BMI 35.23 kg/m   BP Readings from Last 3 Encounters:  01/16/19 130/70  11/21/18 124/82  09/28/18 120/80    Wt Readings from Last 3 Encounters:  01/16/19 215 lb (97.5 kg)  11/21/18 218 lb (98.9 kg)  09/28/18 214 lb (97.1 kg)    Physical Exam Vitals signs reviewed.  Constitutional:      General: She is not in acute distress.    Appearance: She is obese. She is ill-appearing (she occasionally winces in pain). She is not toxic-appearing or  diaphoretic.  HENT:     Mouth/Throat:     Mouth: Mucous membranes are moist.     Pharynx: No pharyngeal swelling or oropharyngeal exudate.  Eyes:     General: No scleral icterus. Cardiovascular:     Rate and Rhythm: Normal rate and regular rhythm.     Heart sounds: No murmur. No friction rub. No gallop.   Pulmonary:     Effort: Pulmonary effort is normal.     Breath sounds: No stridor. No wheezing, rhonchi or rales.  Chest:     Chest wall: Tenderness present. No mass, deformity, swelling or edema.     Abdominal:     General: Abdomen is protuberant.     Palpations: There is no hepatomegaly or splenomegaly.     Tenderness: There is abdominal tenderness in the left upper quadrant. There is no right CVA tenderness, left CVA tenderness, guarding or rebound.     Hernia: No hernia is present.  Musculoskeletal: Normal range of motion.        General: No swelling.     Right lower leg: No edema.     Left lower leg: No edema.  Lymphadenopathy:     Upper Body:     Right upper body: No supraclavicular, axillary or pectoral adenopathy.     Left upper body: No supraclavicular, axillary or pectoral adenopathy.  Skin:    General: Skin is warm and dry.     Findings: No rash.  Neurological:     General: No focal deficit present.     Lab Results  Component Value Date   WBC 9.1 01/16/2019   HGB 13.5 01/16/2019   HCT 39.4 01/16/2019   PLT 242.0 01/16/2019   GLUCOSE 112 (H) 01/16/2019   CHOL 210 (H) 01/16/2019   TRIG 160.0 (H) 01/16/2019   HDL 47.60 01/16/2019   LDLDIRECT 226.0 02/27/2013   LDLCALC 130 (H) 01/16/2019   ALT 23 01/16/2019   AST 17 01/16/2019   NA 139 01/16/2019   K 3.4 (L) 01/16/2019   CL 103 01/16/2019   CREATININE 1.61 (H) 01/16/2019   BUN 15 01/16/2019   CO2 26 01/16/2019   TSH 0.36 09/28/2018   INR 1.02 06/24/2017   HGBA1C 4.7 05/14/2014    Dg Abd Acute 2+v W 1v Chest  Result Date: 01/16/2019 CLINICAL DATA:  Left-sided chest wall and flank pain as well as left lower abdominal pain 1 week. Irregular bowel movement 7 days. EXAM: DG ABDOMEN ACUTE W/ 1V CHEST COMPARISON:  Chest x-ray 09/28/2018, 01/21/2017 FINDINGS: Lungs are adequately inflated without consolidation or effusion. Subtle focal density over the upper lung slightly more prominent compared to 01/21/2017. Cardiomediastinal silhouette and remainder of the chest is unchanged. Abdominopelvic images demonstrate a nonobstructive bowel gas pattern. No free peritoneal air. There are surgical clips over the right  upper quadrant. Surgical suture line over the rectosigmoid colon. Single surgical clip over the right lower quadrant. Mild degenerate change of the spine and hips. IMPRESSION: No acute cardiopulmonary disease. Nonobstructive bowel gas pattern. Subtle focal density over the left upper lung slightly more prominent compared to 01/21/2017. Consider noncontrast chest CT on an elective basis for further evaluation. Electronically Signed   By: Marin Olp M.D.   On: 01/16/2019 14:46    Assessment & Plan:   [name] was seen today for abdominal pain.  Diagnoses and all orders for this visit:  Acquired hypothyroidism  Hyperparathyroidism ([place])- I will monitor her calcium and her PTH level.  CKD (chronic kidney disease) stage  2, GFR 60-89 ml/min- Her creatinine clearance has decreased slightly to 36.3.  She qill avoid nephrotoxic agents.  Acute left flank pain  B12 deficiency  Dysuria- Her UA is positive only for white cells.  Urine culture is pending.  I do not think antibiotic therapy is warranted at this time.  Pleuritic chest pain- She has pleuritic chest pain and a slightly elevated d-dimer.  I have asked her to undergo a CT angio to screen for pulmonary embolus.  If this is negative then I think the most likely cause for her pain is pleurisy. -     CT Angio Chest W/Cm &/Or Wo Cm; Future  D-dimer, elevated -     CT Angio Chest W/Cm &/Or Wo Cm; Future  Chest pain on breathing -     CT Angio Chest W/Cm &/Or Wo Cm; Future   I am having [place] maintain her cholecalciferol, acetaminophen, ALPRAZolam, spironolactone, rizatriptan, methocarbamol, Arnuity Ellipta, Calcium Carbonate-Vit D-Min (CALCIUM 1200 PO), Magnesium, and levothyroxine.  No orders of the defined types were placed in this encounter.    Follow-up: No follow-ups on file.  [name], MD

## 2019-01-16 NOTE — Progress Notes (Signed)
Virtual Visit via Video Note  I connected with Valerie Jackson on 01/16/19 at 10:30 AM EDT by a video enabled telemedicine application and verified that I am speaking with the correct person using two identifiers.   I discussed the limitations of evaluation and management by telemedicine and the availability of in person appointments. The patient expressed understanding and agreed to proceed.  History of Present Illness: Visit was changed to an in person visit due to the complexity of her symptoms    Observations/Objective:  Lab Results  Component Value Date   WBC 8.6 09/28/2018   HGB 13.4 09/28/2018   HCT 40.0 09/28/2018   PLT 216.0 09/28/2018   GLUCOSE 94 09/28/2018   CHOL 211 (H) 12/14/2017   TRIG 131.0 12/14/2017   HDL 53.70 12/14/2017   LDLDIRECT 226.0 02/27/2013   LDLCALC 131 (H) 12/14/2017   ALT 19 03/18/2018   AST 17 03/18/2018   NA 139 09/28/2018   K 4.1 09/28/2018   CL 105 09/28/2018   CREATININE 1.60 (H) 09/28/2018   BUN 13 09/28/2018   CO2 26 09/28/2018   TSH 0.36 09/28/2018   INR 1.02 06/24/2017   HGBA1C 4.7 05/14/2014     Assessment and Plan:   Follow Up Instructions:    I discussed the assessment and treatment plan with the patient. The patient was provided an opportunity to ask questions and all were answered. The patient agreed with the plan and demonstrated an understanding of the instructions.   The patient was advised to call back or seek an in-person evaluation if the symptoms worsen or if the condition fails to improve as anticipated.  I provided 0 minutes of non-face-to-face time during this encounter.   [name], MD

## 2019-01-16 NOTE — Telephone Encounter (Signed)
Incoming call from Patient.  With complain of lower left quandrant pain quadrant and upper pain.  Radiates to towards the front.  Onset was last Tuesday.  Painful  Upon deep breath and expansion of rib cage.  Patient complains of loose stools  also.  Reviewd protocol and care advice.  With Patient voiced understanding.  Transferred Patient to [facility] office  ([place]) for scheduling.    Reason for Disposition . [1] MILD-MODERATE pain AND [2] constant AND [3] present > 2 hours  Answer Assessment - Initial Assessment Questions 1. LOCATION: "Where does it hurt?"      lower lateral 2. RADIATION: "Does the pain shoot anywhere else?" (e.g., chest, back)     Lower margin towards the front.   3. ONSET: "When did the pain begin?" (e.g., minutes, hours or days ago)      Last Tuesday 4. SUDDEN: "Gradual or sudden onset?"   gradual 5. PATTERN "Does the pain come and go, or is it constant?"    - If constant: "Is it getting better, staying the same, or worsening?"      (Note: Constant means the pain never goes away completely; most serious pain is constant and it progresses)     - If intermittent: "How long does it last?" "Do you have pain now?"     (Note: Intermittent means the pain goes away completely between bouts)     Painful upon deep breath,  Expansion of rib cage.   6. SEVERITY: "How bad is the pain?"  (e.g., Scale 1-10; mild, moderate, or severe)   - MILD (1-3): doesn't interfere with normal activities, abdomen soft and not tender to touch    - MODERATE (4-7): interferes with normal activities or awakens from sleep, tender to touch    - SEVERE (8-10): excruciating pain, doubled over, unable to do any normal activities      Mild becomes moderate with cough 7. RECURRENT SYMPTOM: "Have you ever had this type of abdominal pain before?" If so, ask: "When was the last time?" and "What happened that time?"      First time 8. CAUSE: "What do you think is causing the abdominal pain?"    9.  RELIEVING/AGGRAVATING FACTORS: "What makes it better or worse?" (e.g., movement, antacids, bowel movement)    methocarbenol 10. OTHER SYMPTOMS: "Has there been any vomiting, diarrhea, constipation, or urine problems?"       Loose stools 11. PREGNANCY: "Is there any chance you are pregnant?" "When was your last menstrual period?"       na  Protocols used: ABDOMINAL PAIN - [facility]

## 2019-01-17 ENCOUNTER — Ambulatory Visit (HOSPITAL_COMMUNITY)
Admission: RE | Admit: 2019-01-17 | Discharge: 2019-01-17 | Disposition: A | Discharge status: Home / Self Care | Payer: Medicare Other | Source: Ambulatory Visit | Attending: Internal Medicine | Admitting: Internal Medicine

## 2019-01-17 ENCOUNTER — Encounter (HOSPITAL_COMMUNITY): Payer: Self-pay

## 2019-01-17 ENCOUNTER — Encounter: Payer: Self-pay | Admitting: Internal Medicine

## 2019-01-17 ENCOUNTER — Other Ambulatory Visit: Payer: Self-pay

## 2019-01-17 ENCOUNTER — Telehealth: Payer: Self-pay | Admitting: *Deleted

## 2019-01-17 DIAGNOSIS — R7989 Other specified abnormal findings of blood chemistry: Secondary | ICD-10-CM | POA: Diagnosis not present

## 2019-01-17 DIAGNOSIS — R0781 Pleurodynia: Secondary | ICD-10-CM | POA: Diagnosis not present

## 2019-01-17 DIAGNOSIS — R079 Chest pain, unspecified: Secondary | ICD-10-CM | POA: Diagnosis not present

## 2019-01-17 DIAGNOSIS — R071 Chest pain on breathing: Secondary | ICD-10-CM | POA: Diagnosis not present

## 2019-01-17 MED ORDER — IOHEXOL 350 MG/ML SOLN
100.0000 mL | Freq: Once | INTRAVENOUS | Status: AC | PRN
  Administered 2019-01-17: 16:00:00 80 mL via INTRAVENOUS

## 2019-01-17 MED ORDER — SODIUM CHLORIDE (PF) 0.9 % IJ SOLN
INTRAMUSCULAR | Status: AC
  Filled 2019-01-17: qty 50

## 2019-01-17 NOTE — Telephone Encounter (Signed)
Pt informed of below per PCP: The test for blood clot was slightly elevated.  I am concerned your symptoms may indicate that you have a blood clot in your lungs.  I have therefore ordered a CT scan of your chest to see what is going on in your lungs and rib cage.  We will call you about getting that done today.   I informed her she will receive a phone call today with when and where to go have her imaging. Pt verbalized understanding.

## 2019-01-18 ENCOUNTER — Encounter: Payer: Self-pay | Admitting: Internal Medicine

## 2019-01-18 ENCOUNTER — Other Ambulatory Visit: Payer: Self-pay | Admitting: Internal Medicine

## 2019-01-18 DIAGNOSIS — B9689 Other specified bacterial agents as the cause of diseases classified elsewhere: Secondary | ICD-10-CM | POA: Insufficient documentation

## 2019-01-18 DIAGNOSIS — N390 Urinary tract infection, site not specified: Secondary | ICD-10-CM

## 2019-01-18 DIAGNOSIS — B961 Klebsiella pneumoniae [K. pneumoniae] as the cause of diseases classified elsewhere: Principal | ICD-10-CM

## 2019-01-18 MED ORDER — SULFAMETHOXAZOLE-TRIMETHOPRIM 800-160 MG PO TABS: 1.0000 | ORAL_TABLET | Freq: Two times a day (BID) | ORAL | 0 refills | Status: AC

## 2019-01-19 ENCOUNTER — Other Ambulatory Visit: Payer: Self-pay | Admitting: Internal Medicine

## 2019-01-19 DIAGNOSIS — E890 Postprocedural hypothyroidism: Secondary | ICD-10-CM

## 2019-01-19 LAB — D-DIMER, QUANTITATIVE: D-Dimer, Quant: 1.73 mcg/mL FEU — ABNORMAL HIGH (ref <0.50)

## 2019-01-19 LAB — PTH, INTACT AND CALCIUM
Calcium: 10.3 mg/dL (ref 8.6–10.4)
PTH: 31 pg/mL (ref 14–64)

## 2019-01-19 LAB — CULTURE, URINE COMPREHENSIVE
MICRO NUMBER:: 442588
SPECIMEN QUALITY:: ADEQUATE

## 2019-01-23 DIAGNOSIS — N2581 Secondary hyperparathyroidism of renal origin: Secondary | ICD-10-CM | POA: Diagnosis not present

## 2019-01-23 DIAGNOSIS — N183 Chronic kidney disease, stage 3 (moderate): Secondary | ICD-10-CM | POA: Diagnosis not present

## 2019-01-23 DIAGNOSIS — N189 Chronic kidney disease, unspecified: Secondary | ICD-10-CM | POA: Diagnosis not present

## 2019-01-23 DIAGNOSIS — I129 Hypertensive chronic kidney disease with stage 1 through stage 4 chronic kidney disease, or unspecified chronic kidney disease: Secondary | ICD-10-CM | POA: Diagnosis not present

## 2019-01-23 DIAGNOSIS — D631 Anemia in chronic kidney disease: Secondary | ICD-10-CM | POA: Diagnosis not present

## 2019-01-23 LAB — VITAMIN D 25 HYDROXY (VIT D DEFICIENCY, FRACTURES): Vit D, 25-Hydroxy: 48.2

## 2019-01-23 LAB — BASIC METABOLIC PANEL
BUN: 13 (ref 4–21)
Creatinine: 2.3 — AB (ref 0.5–1.1)
Glucose: 92
Potassium: 4.6 (ref 3.4–5.3)
Sodium: 138 (ref 137–147)

## 2019-01-23 LAB — CBC AND DIFFERENTIAL
HCT: 41 (ref 36–46)
Hemoglobin: 14.2 (ref 12.0–16.0)
Platelets: 306 (ref 150–399)
WBC: 9.2

## 2019-01-23 LAB — IRON,TIBC AND FERRITIN PANEL
%SAT: 34
Ferritin: 341
Iron: 110
TIBC: 328
UIBC: 218

## 2019-02-02 ENCOUNTER — Other Ambulatory Visit: Payer: Self-pay | Admitting: Nephrology

## 2019-02-02 DIAGNOSIS — N183 Chronic kidney disease, stage 3 unspecified: Secondary | ICD-10-CM

## 2019-02-07 ENCOUNTER — Other Ambulatory Visit: Payer: Self-pay | Admitting: Internal Medicine

## 2019-02-07 DIAGNOSIS — I10 Essential (primary) hypertension: Secondary | ICD-10-CM

## 2019-02-07 DIAGNOSIS — E876 Hypokalemia: Secondary | ICD-10-CM

## 2019-02-07 MED ORDER — SPIRONOLACTONE 25 MG PO TABS: 12.5000 mg | ORAL_TABLET | Freq: Every day | ORAL | 1 refills | Status: DC

## 2019-02-14 DIAGNOSIS — N183 Chronic kidney disease, stage 3 (moderate): Secondary | ICD-10-CM | POA: Diagnosis not present

## 2019-02-15 ENCOUNTER — Ambulatory Visit
Admission: RE | Admit: 2019-02-15 | Discharge: 2019-02-15 | Disposition: A | Discharge status: Home / Self Care | Payer: Medicare Other | Source: Ambulatory Visit | Attending: Nephrology | Admitting: Nephrology

## 2019-02-15 DIAGNOSIS — N183 Chronic kidney disease, stage 3 unspecified: Secondary | ICD-10-CM

## 2019-02-15 DIAGNOSIS — I129 Hypertensive chronic kidney disease with stage 1 through stage 4 chronic kidney disease, or unspecified chronic kidney disease: Secondary | ICD-10-CM | POA: Diagnosis not present

## 2019-03-02 ENCOUNTER — Telehealth: Payer: Self-pay

## 2019-03-02 NOTE — Telephone Encounter (Signed)
Phone screening complete 

## 2019-03-07 ENCOUNTER — Ambulatory Visit (INDEPENDENT_AMBULATORY_CARE_PROVIDER_SITE_OTHER): Payer: Medicare Other | Admitting: Internal Medicine

## 2019-03-07 VITALS — Ht 65.5 in | Wt 215.0 lb

## 2019-03-07 DIAGNOSIS — R197 Diarrhea, unspecified: Secondary | ICD-10-CM | POA: Diagnosis not present

## 2019-03-07 DIAGNOSIS — Z86010 Personal history of colonic polyps: Secondary | ICD-10-CM

## 2019-03-07 MED ORDER — NA SULFATE-K SULFATE-MG SULF 17.5-3.13-1.6 GM/177ML PO SOLN: 1.0000 | Freq: Once | ORAL | 0 refills | Status: AC

## 2019-03-07 NOTE — Patient Instructions (Signed)
You have been scheduled for a colonoscopy. Please follow written instructions given to you at your visit today.  Please pick up your prep supplies at the pharmacy within the next 1-3 days. If you use inhalers (even only as needed), please bring them with you on the day of your procedure.

## 2019-03-09 ENCOUNTER — Encounter: Payer: Self-pay | Admitting: Internal Medicine

## 2019-03-09 NOTE — Progress Notes (Signed)
HISTORY OF PRESENT ILLNESS:  Valerie Jackson is a 73 y.o. 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  I last saw the patient in consultation Jan 26, 2014 regarding the need for preoperative colonoscopy after she had previously suffered perforated diverticulitis, status post rectosigmoid colectomy with colostomy.  That colonoscopy was performed Jan 31, 2014.  She was found to have adenomatous and sessile serrated polyps.  Follow-up in 5 years recommended.  She subsequently underwent successful takedown of her colostomy.  Patient tells me that ever since her colostomy reversal she has had problems with diarrhea.  This is typically postprandial within 30 to 90 minutes of her meal.  She would describe between 3 and 7 bowel movements per day which are rarely solid and may be purely water.  Overall she feels that her condition has slightly worsened with time.  She finds this difficult socially as meals may induce symptoms.  She has had slight weight gain with time.  She did try probiotics which did not help.  She does have thyroid disease but her most recent TSH was normal.  In addition to the diarrhea she does have urgency, rare incontinence, and the inability to discriminate between gas and feces.  She rarely has nocturnal symptoms as well.  No bleeding.  Review of outside laboratories from 04/24/2024 creatinine 2.3.  Normal iron studies.  Review of relevant abdominal imaging includes acute abdominal series Jan 16, 2019 with nonobstructive bowel gas pattern.  CT scan from 2018 to evaluate left lower quadrant abdominal pain revealed no acute pathology.  Fatty liver noted.  REVIEW OF SYSTEMS:  All non-GI ROS negative unless otherwise stated in the HPI except for arthritis  Past Medical History:  Diagnosis Date  . Arthritis    fingers  . Blood clot in vein 1968  . Cataracts, bilateral   .  CHF (congestive heart failure) ([place])   . Chronic kidney disease    CKD stage 2  . COPD (chronic obstructive pulmonary disease) ([place])   . Depression   . Diverticulitis 09/2013   with perforation  . Dizziness    with decreased BP readings or bending over  . Floaters   . GERD (gastroesophageal reflux disease)   . H/O hiatal hernia   . Headache(784.0)    migraine every 5- to 6 years  . Heart murmur since age 30   mild  . Hepatitis age 78 or 63   had heapatitis, not sure which kind  . History of blood transfusion 1965  . History of frequent urinary tract infections   . Hypercholesteremia   . Hypertension   . Hypothyroid   . Obesity   . Pericolonic abscess 2015  . Pneumonia   . Sickle cell trait [facility])     Past Surgical History:  Procedure Laterality Date  . CHOLECYSTECTOMY  2002  . COLON RESECTION N/A 10/11/2013   Procedure: EXPLORATORY LAPAROTOMY WITH RECTOSIGMOID COLECTOMY AND END COLOSTOMY;  Surgeon: Stark Klein, MD;  Location: [facility];  Service: General;  Laterality: N/A;  . COLOSTOMY TAKEDOWN N/A 02/27/2014   Procedure: COLOSTOMY TAKEDOWN [name] HERNIA REPAIR;  Surgeon: Stark Klein, MD;  Location: [facility];  Service: General;  Laterality: N/A;  . INCISIONAL HERNIA REPAIR N/A 03/04/2016   Procedure: LAPAROSCOPIC INCISIONAL HERNIA WITH MESH;  Surgeon: Ralene Ok, MD;  Location: [facility];  Service: General;  Laterality: N/A;  . LAPAROSCOPIC LYSIS OF ADHESIONS N/A 03/04/2016  Procedure: LAPAROSCOPIC LYSIS OF ADHESIONS;  Surgeon: Ralene Ok, MD;  Location: [facility];  Service: General;  Laterality: N/A;  . RIGHT HEART CATH N/A 06/24/2017   Procedure: RIGHT HEART CATH;  Surgeon: Jolaine Artist, MD;  Location: [facility];  Service: Cardiovascular;  Laterality: N/A;  . [place]  reports that she quit smoking about 6 years ago. Her smoking use included cigarettes. She has a 26.00 pack-year smoking history. She has never  used smokeless tobacco. She reports that she does not drink alcohol or use drugs.  family history includes Aneurysm in her mother; Arthritis in her sister; Breast cancer (age of onset: 62) in her maternal grandmother; Cancer in her mother and another family member; Glaucoma in her brother; Hyperlipidemia in her brother and sister; Hypertension in her maternal grandmother, mother, and sister; Kidney disease in her sister; Stroke in her sister; Thyroid cancer in her daughter.  Allergies  Allergen Reactions  . Estrogens     Blood clot in leg       PHYSICAL EXAMINATION: No physical examination with telehealth medicine visit   ASSESSMENT:  1.  Chronic slightly worsening postprandial diarrhea with urgency, incontinence, and decreased quality of life.  Possible causes include microscopic colitis, bile salt related diarrhea (status post cholecystectomy), irritable bowel syndrome, and bacterial overgrowth. 2.  History of perforated diverticulitis status post temporary colostomy with subsequent reversal 3.  History of adenomatous colon polyps and sessile serrated polyps on preoperative colonoscopy 2015.  Due for surveillance 4.  General medical problems.  Stable   PLAN:  1.  SCHEDULE COLONOSCOPY in the [place] with biopsies.  This to provide neoplasia surveillance and evaluate chronic diarrhea.  Rule out microscopic colitis.The nature of the procedure, as well as the risks, benefits, and alternatives were carefully and thoroughly reviewed with the patient. Ample time for discussion and questions allowed. The patient understood, was satisfied, and agreed to proceed. 2.  Consider trial of Colestid 3.  Consider antidiarrheal such as Imodium 4.  Consider Xifaxan This telehealth medicine visit was initiated by and consented for by the patient who was in her home while I was in my office during the encounter.  She understands her may be associated professional charge for this service.

## 2019-03-14 ENCOUNTER — Telehealth: Payer: Self-pay | Admitting: Internal Medicine

## 2019-03-14 NOTE — Telephone Encounter (Signed)
Spoke with patient regarding Covid-19 screening questions Covid-19 Screening Questions:  Do you now or have you had a fever in the last 14 days? no  Do you have any respiratory symptoms of shortness of breath or cough now or in the last 14 days? no  Do you have any family members or close contacts with diagnosed or suspected Covid-19 in the past 14 days? no   Have you been tested for Covid-19 and found to be positive? No  Pt made aware of that care partner may wait in the car or come up to the lobby during the procedure but will need to provide their own mask.

## 2019-03-15 ENCOUNTER — Other Ambulatory Visit: Payer: Self-pay

## 2019-03-15 ENCOUNTER — Ambulatory Visit (AMBULATORY_SURGERY_CENTER): Payer: Medicare Other | Admitting: Internal Medicine

## 2019-03-15 ENCOUNTER — Encounter: Payer: Self-pay | Admitting: Internal Medicine

## 2019-03-15 VITALS — BP 145/88 | HR 68 | Temp 97.6°F | Resp 16 | Ht 65.0 in | Wt 215.0 lb

## 2019-03-15 DIAGNOSIS — R197 Diarrhea, unspecified: Secondary | ICD-10-CM | POA: Diagnosis not present

## 2019-03-15 DIAGNOSIS — Z86010 Personal history of colonic polyps: Secondary | ICD-10-CM | POA: Diagnosis not present

## 2019-03-15 DIAGNOSIS — D123 Benign neoplasm of transverse colon: Secondary | ICD-10-CM

## 2019-03-15 LAB — HM COLONOSCOPY

## 2019-03-15 MED ORDER — SODIUM CHLORIDE 0.9 % IV SOLN: 500.0000 mL | Freq: Once | INTRAVENOUS | Status: DC

## 2019-03-15 NOTE — Progress Notes (Signed)
Pt's states no medical or surgical changes since previsit or office visit. 

## 2019-03-15 NOTE — Op Note (Signed)
[facility] Patient Name: Valerie Jackson Procedure Date: 03/15/2019 2:34 PM MRN: [id number] Endoscopist: Docia Chuck. Henrene Pastor , MD Age: 73 Referring MD:  Date of Birth: 31-Jul-1946 Gender: Female Account #: [id number] Procedure:                Colonoscopy with cold snare polypectomy; with                            biopsies Indications:              High risk colon cancer surveillance: Personal                            history of non-advanced adenoma, High risk colon                            cancer surveillance: Personal history of sessile                            serrated colon polyp (less than 10 mm in size) with                            no dysplasia, Incidental diarrhea noted Medicines:                Monitored Anesthesia Care Procedure:                Pre-Anesthesia Assessment:                           - Prior to the procedure, a History and Physical                            was performed, and patient medications and                            allergies were reviewed. The patient's tolerance of                            previous anesthesia was also reviewed. The risks                            and benefits of the procedure and the sedation                            options and risks were discussed with the patient.                            All questions were answered, and informed consent                            was obtained. Prior Anticoagulants: The patient has                            taken no previous anticoagulant or antiplatelet  agents. ASA Grade Assessment: II - A patient with                            mild systemic disease. After reviewing the risks                            and benefits, the patient was deemed in                            satisfactory condition to undergo the procedure.                           After obtaining informed consent, the colonoscope                            was passed under direct vision.  Throughout the                            procedure, the patient's blood pressure, pulse, and                            oxygen saturations were monitored continuously. The                            Colonoscope was introduced through the anus and                            advanced to the the cecum, identified by                            appendiceal orifice and ileocecal valve. The                            ileocecal valve, appendiceal orifice, and rectum                            were photographed. The quality of the bowel                            preparation was excellent. The colonoscopy was                            performed without difficulty. The patient tolerated                            the procedure well. The bowel preparation used was                            SUPREP via split dose instruction. Scope In: 2:44:42 PM Scope Out: 2:57:44 PM Scope Withdrawal Time: 0 hours 11 minutes 2 seconds  Total Procedure Duration: 0 hours 13 minutes 2 seconds  Findings:                 A 4 mm polyp was found in the transverse colon.  The                            polyp was removed with a cold snare. Resection and                            retrieval were complete.                           Scattered diverticula were found in both the right                            and left colon.                           The surgical anastomosis was found in the mid to                            distal. Surgical suture material noted. The entire                            examined colon appeared otherwise normal on direct                            views. No attempt at rectal retroflexion due to                            narrow vault from prior surgery. Excellent                            antegrade view of the rectum obtained from the anal                            verge (photograph). Biopsies for histology were                            taken with a cold forceps from the entire colon for                             evaluation of microscopic colitis. Complications:            No immediate complications. Estimated blood loss:                            None. Estimated Blood Loss:     Estimated blood loss: none. Impression:               - One 4 mm polyp in the transverse colon, removed                            with a cold snare. Resected and retrieved.                           - Diverticulosis in the entire examined colon.  Status post prior segmental colectomy with reversal                            colostomy for complicated diverticular disease.                           - The entire examined colon is normal on direct and                            retroflexion views. Recommendation:           - Repeat colonoscopy in 5 years for surveillance                            (history of multiple polyps).                           - Patient has a contact number available for                            emergencies. The signs and symptoms of potential                            delayed complications were discussed with the                            patient. Return to normal activities tomorrow.                            Written discharge instructions were provided to the                            patient.                           - Resume previous diet.                           - Continue present medications.                           - Await pathology results.                           - If biopsies negative would institute a course of                            Colestid for possible bile salt related diarrhea. Docia Chuck. Henrene Pastor, MD 03/15/2019 3:09:44 PM This report has been signed electronically.

## 2019-03-15 NOTE — Progress Notes (Signed)
Called to room to assist during endoscopic procedure.  Patient ID and intended procedure confirmed with present staff. Received instructions for my participation in the procedure from the performing physician.  

## 2019-03-15 NOTE — Patient Instructions (Signed)
Information on polyps and diverticulosis given to you today.  Await pathology results.  YOU HAD AN ENDOSCOPIC PROCEDURE TODAY AT [facility]:   Refer to the procedure report that was given to you for any specific questions about what was found during the examination.  If the procedure report does not answer your questions, please call your gastroenterologist to clarify.  If you requested that your care partner not be given the details of your procedure findings, then the procedure report has been included in a sealed envelope for you to review at your convenience later.  YOU SHOULD EXPECT: Some feelings of bloating in the abdomen. Passage of more gas than usual.  Walking can help get rid of the air that was put into your GI tract during the procedure and reduce the bloating. If you had a lower endoscopy (such as a colonoscopy or flexible sigmoidoscopy) you may notice spotting of blood in your stool or on the toilet paper. If you underwent a bowel prep for your procedure, you may not have a normal bowel movement for a few days.  Please Note:  You might notice some irritation and congestion in your nose or some drainage.  This is from the oxygen used during your procedure.  There is no need for concern and it should clear up in a day or so.  SYMPTOMS TO REPORT IMMEDIATELY:   Following lower endoscopy (colonoscopy or flexible sigmoidoscopy):  Excessive amounts of blood in the stool  Significant tenderness or worsening of abdominal pains  Swelling of the abdomen that is new, acute  Fever of 100F or higher   For urgent or emergent issues, a gastroenterologist can be reached at any hour by calling [phone].   DIET:  We do recommend a small meal at first, but then you may proceed to your regular diet.  Drink plenty of fluids but you should avoid alcoholic beverages for 24 hours.  ACTIVITY:  You should plan to take it easy for the rest of today and you should NOT DRIVE or use  heavy machinery until tomorrow (because of the sedation medicines used during the test).    FOLLOW UP: Our staff will call the number listed on your records 48-72 hours following your procedure to check on you and address any questions or concerns that you may have regarding the information given to you following your procedure. If we do not reach you, we will leave a message.  We will attempt to reach you two times.  During this call, we will ask if you have developed any symptoms of COVID 19. If you develop any symptoms (ie: fever, flu-like symptoms, shortness of breath, cough etc.) before then, please call [phone].  If you test positive for Covid 19 in the 2 weeks post procedure, please call and report this information to [place].    If any biopsies were taken you will be contacted by phone or by letter within the next 1-3 weeks.  Please call us at [phone] if you have not heard about the biopsies in 3 weeks.    SIGNATURES/CONFIDENTIALITY: You and/or your care partner have signed paperwork which will be entered into your electronic medical record.  These signatures attest to the fact that that the information above on your After Visit Summary has been reviewed and is understood.  Full responsibility of the confidentiality of this discharge information lies with you and/or your care-partner.

## 2019-03-15 NOTE — Progress Notes (Signed)
A and O x3. Report to RN. Tolerated MAC anesthesia well.

## 2019-03-20 ENCOUNTER — Telehealth: Payer: Self-pay

## 2019-03-20 NOTE — Telephone Encounter (Signed)
Left message on answering machine. 

## 2019-03-21 ENCOUNTER — Encounter: Payer: Self-pay | Admitting: Internal Medicine

## 2019-03-21 NOTE — Telephone Encounter (Signed)
Patient called and stated that she is doing fine.

## 2019-03-23 ENCOUNTER — Other Ambulatory Visit: Payer: Self-pay

## 2019-03-23 MED ORDER — COLESTIPOL HCL 1 G PO TABS: 2.0000 g | ORAL_TABLET | Freq: Two times a day (BID) | ORAL | 3 refills | Status: DC

## 2019-03-23 MED ORDER — COLESTIPOL HCL 1 G PO TABS: 2.0000 g | ORAL_TABLET | Freq: Every day | ORAL | 3 refills | Status: DC

## 2019-03-27 ENCOUNTER — Other Ambulatory Visit: Payer: Self-pay | Admitting: Pulmonary Disease

## 2019-03-28 ENCOUNTER — Ambulatory Visit: Payer: Medicare Other | Admitting: Adult Health

## 2019-03-29 ENCOUNTER — Encounter: Payer: Self-pay | Admitting: Adult Health

## 2019-03-29 ENCOUNTER — Other Ambulatory Visit: Payer: Self-pay

## 2019-03-29 ENCOUNTER — Ambulatory Visit (INDEPENDENT_AMBULATORY_CARE_PROVIDER_SITE_OTHER): Payer: Medicare Other | Admitting: Adult Health

## 2019-03-29 DIAGNOSIS — J4530 Mild persistent asthma, uncomplicated: Secondary | ICD-10-CM | POA: Diagnosis not present

## 2019-03-29 DIAGNOSIS — J439 Emphysema, unspecified: Secondary | ICD-10-CM | POA: Diagnosis not present

## 2019-03-29 MED ORDER — ARNUITY ELLIPTA 100 MCG/ACT IN AEPB: 1.0000 | INHALATION_SPRAY | Freq: Every day | RESPIRATORY_TRACT | 3 refills | Status: DC

## 2019-03-29 NOTE — Patient Instructions (Signed)
Continue on ARNUITY . Rinse after use.  Follow up with Dr. Lake Bells or Doneta Bayman in 1 year and As needed

## 2019-03-29 NOTE — Assessment & Plan Note (Signed)
Mild emphysema noted on CT scan.  Appears to be stable

## 2019-03-29 NOTE — Progress Notes (Signed)
@Patient  ID: Valerie Jackson, female    DOB: 1946/06/29, 73 y.o.   MRN: [id number]  Chief Complaint  Patient presents with  . Follow-up    Asthma     Referring provider: Janith Lima, MD  HPI: 73 year old female former smoker followed for mild emphysema and asthma Previous hx diverticulitis with colostomy s/p reversal   TEST/EVENTS :  2018 chest x-ray images independently reviewed showing flattening of the diaphragms but no pulmonary parenchymal abnormality 2015 chest CT images independently reviewed showing possible cysts versus mild centrilobular emphysema in the left lung only, otherwise normal pulmonary parenchyma June 2018 high-resolution CT chest images independently reviewed showing mild centrilobular emphysema and an upper lobe distribution no interstitial lung disease findings July 2018 VQ scan negative for pulmonary bolus him  PFT: May 2018 PFT ratio 69%, FEV1 1.55 L, improved to 1.74 L with resolution of airflow obstruction, 12% change post bronchodilator, change was only 190 mL however, forced vital capacity 2.35 L 92% predicted, total lung capacity 4.08 L 76% predicted, DLCO 11.81 43% predicted  Echo: April 2018: Mild concentric left ventricular hypertrophy, systolic function normal, LVEF 50-55%, normal wall motion, grade 1 diastolic dysfunction, aortic valve normal, trivial mitral regurgitation, right ventricle size and thickness and systolic function normal, atrial septum normal, trivial tricuspid regurgitation, pulmonary artery systolic pressure estimated 31 mmHg  Exercise stress test: August 2017 exercise stress test: Negative stress test without evidence of ischemia, frequent PVCs noted  Heart catheterization: October 2018 PA mean pressure 15, pulmonary capillary wedge pressure 7,  Exhaled Nitric Oxide: 02/2017 22 ppm  03/29/2019 Follow up : Asthma  Patient presents for a one-year follow-up.  Patient has underlying asthma and mild emphysema.  She is a  former smoker.  Patient says overall she is doing well.  With no increased cough or shortness of breath.  Feels that her activity tolerance is at baseline.  Does get winded with heavy walking but feel breathing is actually  She remains on Arnuity daily. Was tried on Trelegy last visit but too expensive .  She denies any wheezing .  Does not use albuterol , we discussed having it for emergency purposes   Had pleuritic pain in May , D Dimer was elevated. Subsequent CT a Chest was negative for PE. [name] Showed mild emphysema .    Allergies  Allergen Reactions  . Estrogens     Blood clot in leg    Immunization History  Administered Date(s) Administered  . Influenza, High Dose Seasonal PF 07/19/2013, 09/28/2018  . Pneumococcal Conjugate-13 07/19/2013  . Pneumococcal Polysaccharide-23 09/25/2015  . Td 09/15/2011    Past Medical History:  Diagnosis Date  . Arthritis    fingers  . Blood clot in vein 1968  . Cataracts, bilateral   . CHF (congestive heart failure) ([place])   . Chronic kidney disease    CKD stage 2  . COPD (chronic obstructive pulmonary disease) ([place])   . Depression   . Diverticulitis 09/2013   with perforation  . Dizziness    with decreased BP readings or bending over  . Floaters   . GERD (gastroesophageal reflux disease)   . H/O hiatal hernia   . Headache(784.0)    migraine every 5- to 6 years  . Heart murmur since age 25   mild  . Hepatitis age 69 or 30   had heapatitis, not sure which kind  . History of blood transfusion 1965  . History of frequent urinary tract infections   .  Hypercholesteremia   . Hypertension   . Hypothyroid   . Obesity   . Pericolonic abscess 2015  . Pneumonia   . Sickle cell trait ([place])     Tobacco History: Social History   Tobacco Use  Smoking Status Former Smoker  . Packs/day: 0.50  . Years: 52.00  . Pack years: 26.00  . Types: Cigarettes  . Quit date: 09/06/2012  . Years since quitting: 6.5  Smokeless Tobacco Never Used   Tobacco Comment   never smoked a full PPD.    Counseling given: Not Answered Comment: never smoked a full PPD.    Outpatient Medications Prior to Visit  Medication Sig Dispense Refill  . acetaminophen (TYLENOL) 500 MG tablet Take 1,000 mg by mouth every 6 (six) hours as needed for headache.    . ALPRAZolam (XANAX) 0.5 MG tablet Take 1 tablet (0.5 mg total) by mouth at bedtime as needed for anxiety. 180 tablet 1  . ARNUITY ELLIPTA 100 MCG/ACT AEPB INHALE 1 PUFF INTO THE LUNGS DAILY. 90 each 1  . Calcium Carbonate-Vit D-Min (CALCIUM 1200 PO) Take 1 tablet by mouth daily.    . cholecalciferol (VITAMIN D) 1000 UNITS tablet Take 2,000 Units by mouth daily.     . colestipol (COLESTID) 1 g tablet Take 2 tablets (2 g total) by mouth 2 (two) times daily. 120 tablet 3  . levothyroxine (SYNTHROID) 112 MCG tablet TAKE 1 TABLET EVERY DAY 90 tablet 1  . Magnesium 100 MG CAPS Take 1 capsule by mouth daily.    . methocarbamol (ROBAXIN) 500 MG tablet Take 1 tablet (500 mg total) by mouth every 8 (eight) hours as needed for muscle spasms. 60 tablet 3  . rizatriptan (MAXALT-MLT) 10 MG disintegrating tablet Take 1 tablet (10 mg total) by mouth as needed for migraine. May repeat in 2 hours if needed 30 tablet 1  . spironolactone (ALDACTONE) 25 MG tablet Take 0.5 tablets (12.5 mg total) by mouth daily. 45 tablet 1   No facility-administered medications prior to visit.      Review of Systems:   Constitutional:   No  weight loss, night sweats,  Fevers, chills, + fatigue, or  lassitude.  HEENT:   No headaches,  Difficulty swallowing,  Tooth/dental problems, or  Sore throat,                No sneezing, itching, ear ache,  +nasal congestion, post nasal drip,   CV:  No chest pain,  Orthopnea, PND, swelling in lower extremities, anasarca, dizziness, palpitations, syncope.   GI  No heartburn, indigestion, abdominal pain, nausea, vomiting, diarrhea, change in bowel habits, loss of appetite, bloody stools.    Resp:    No excess mucus, no productive cough,  No non-productive cough,  No coughing up of blood.  No change in color of mucus.  No wheezing.  No chest wall deformity  Skin: no rash or lesions.  GU: no dysuria, change in color of urine, no urgency or frequency.  No flank pain, no hematuria   MS:  No joint pain or swelling.  No decreased range of motion.  No back pain.    Physical Exam  BP 126/78 (BP Location: Right Arm, Cuff Size: Normal)   Pulse 89   Temp 98 F (36.7 C) (Oral)   Ht 5' 5.5" (1.664 m)   Wt 216 lb 6.4 oz (98.2 kg)   SpO2 97%   BMI 35.46 kg/m   GEN: A/Ox3; pleasant , NAD, obese  HEENT:  [place]/AT,  EACs-clear, TMs-wnl, NOSE-clear, THROAT-clear, no lesions, no postnasal drip or exudate noted.   NECK:  Supple w/ fair ROM; no JVD; normal carotid impulses w/o bruits; no thyromegaly or nodules palpated; no lymphadenopathy.    RESP  Clear  P & A; w/o, wheezes/ rales/ or rhonchi. no accessory muscle use, no dullness to percussion  CARD:  RRR, no m/r/g, no peripheral edema, pulses intact, no cyanosis or clubbing.  GI:   Soft & nt; nml bowel sounds; no organomegaly or masses detected.   Musco: Warm bil, no deformities or joint swelling noted.   Neuro: alert, no focal deficits noted.    Skin: Warm, no lesions or rashes    Lab Results:  CBC     Component Value Date/Time   PROBNP 9.6 02/20/2013 1600    Imaging: No results found.    PFT Results Latest Ref Rng & Units 01/14/2017  FVC-Pre L 2.23  FVC-Predicted Pre % 87  FVC-Post L 2.35  FVC-Predicted Post % 92  Pre FEV1/FVC % % 69  Post FEV1/FCV % % 74  FEV1-Pre L 1.55  FEV1-Predicted Pre % 78  FEV1-Post L 1.74  DLCO UNC% % 43  DLCO COR %Predicted % 69  TLC L 4.08  TLC % Predicted % 76  RV % Predicted % 79    Lab Results  Component Value Date   NITRICOXIDE 22 02/23/2017        Assessment & Plan:   Asthma Mild persistent asthma doing well on Arnuity. Declines albuterol inhaler  Plan   Patient Instructions  Continue on ARNUITY . Rinse after use.  Follow up with Dr. Lake Bells or [place] in 1 year and As needed        Emphysema lung ([place]) Mild emphysema noted on CT scan.  Appears to be stable     Rexene Edison, NP 03/29/2019

## 2019-03-29 NOTE — Assessment & Plan Note (Signed)
Mild persistent asthma doing well on Arnuity. Declines albuterol inhaler  Plan  Patient Instructions  Continue on ARNUITY . Rinse after use.  Follow up with Dr. Lake Bells or [place] in 1 year and As needed

## 2019-03-29 NOTE — Addendum Note (Signed)
Addended by: Parke Poisson E on: 03/29/2019 12:42 PM   Modules accepted: Orders

## 2019-03-30 NOTE — Progress Notes (Signed)
Reviewed, agree 

## 2019-04-17 DIAGNOSIS — N183 Chronic kidney disease, stage 3 (moderate): Secondary | ICD-10-CM | POA: Diagnosis not present

## 2019-04-26 DIAGNOSIS — I129 Hypertensive chronic kidney disease with stage 1 through stage 4 chronic kidney disease, or unspecified chronic kidney disease: Secondary | ICD-10-CM | POA: Diagnosis not present

## 2019-04-26 DIAGNOSIS — N2581 Secondary hyperparathyroidism of renal origin: Secondary | ICD-10-CM | POA: Diagnosis not present

## 2019-04-26 DIAGNOSIS — N183 Chronic kidney disease, stage 3 (moderate): Secondary | ICD-10-CM | POA: Diagnosis not present

## 2019-04-26 DIAGNOSIS — D631 Anemia in chronic kidney disease: Secondary | ICD-10-CM | POA: Diagnosis not present

## 2019-05-17 DIAGNOSIS — H52203 Unspecified astigmatism, bilateral: Secondary | ICD-10-CM | POA: Diagnosis not present

## 2019-05-17 DIAGNOSIS — H43813 Vitreous degeneration, bilateral: Secondary | ICD-10-CM | POA: Diagnosis not present

## 2019-05-17 DIAGNOSIS — H26491 Other secondary cataract, right eye: Secondary | ICD-10-CM | POA: Diagnosis not present

## 2019-05-17 DIAGNOSIS — H35372 Puckering of macula, left eye: Secondary | ICD-10-CM | POA: Diagnosis not present

## 2019-05-26 ENCOUNTER — Telehealth: Payer: Self-pay | Admitting: *Deleted

## 2019-05-26 ENCOUNTER — Other Ambulatory Visit: Payer: Self-pay

## 2019-05-26 ENCOUNTER — Ambulatory Visit (INDEPENDENT_AMBULATORY_CARE_PROVIDER_SITE_OTHER): Payer: Medicare Other

## 2019-05-26 DIAGNOSIS — Z23 Encounter for immunization: Secondary | ICD-10-CM | POA: Diagnosis not present

## 2019-05-26 NOTE — Telephone Encounter (Signed)
Pt came in for flu shot and wanting top get a refill on her Alprazolam. Check [address] registry last filled 04/07/2018.Marland KitchenJohny Chess

## 2019-05-29 ENCOUNTER — Other Ambulatory Visit: Payer: Self-pay | Admitting: Internal Medicine

## 2019-05-29 DIAGNOSIS — F418 Other specified anxiety disorders: Secondary | ICD-10-CM

## 2019-05-29 MED ORDER — ALPRAZOLAM 0.5 MG PO TABS: 0.5000 mg | ORAL_TABLET | Freq: Every evening | ORAL | 1 refills | Status: DC | PRN

## 2019-05-30 NOTE — Telephone Encounter (Signed)
MD sent rx to [facility] on yesterday.Marland KitchenJohny Chess

## 2019-06-22 ENCOUNTER — Encounter: Payer: Self-pay | Admitting: Gynecology

## 2019-08-02 ENCOUNTER — Ambulatory Visit (INDEPENDENT_AMBULATORY_CARE_PROVIDER_SITE_OTHER): Payer: Medicare Other | Admitting: Internal Medicine

## 2019-08-02 ENCOUNTER — Other Ambulatory Visit (INDEPENDENT_AMBULATORY_CARE_PROVIDER_SITE_OTHER): Payer: Medicare Other

## 2019-08-02 ENCOUNTER — Other Ambulatory Visit: Payer: Self-pay

## 2019-08-02 ENCOUNTER — Encounter: Payer: Self-pay | Admitting: Internal Medicine

## 2019-08-02 VITALS — BP 156/102 | HR 72 | Temp 98.1°F | Resp 16 | Ht 65.5 in | Wt 211.0 lb

## 2019-08-02 DIAGNOSIS — M791 Myalgia, unspecified site: Secondary | ICD-10-CM

## 2019-08-02 DIAGNOSIS — N182 Chronic kidney disease, stage 2 (mild): Secondary | ICD-10-CM | POA: Diagnosis not present

## 2019-08-02 DIAGNOSIS — I10 Essential (primary) hypertension: Secondary | ICD-10-CM

## 2019-08-02 DIAGNOSIS — E213 Hyperparathyroidism, unspecified: Secondary | ICD-10-CM | POA: Diagnosis not present

## 2019-08-02 DIAGNOSIS — E538 Deficiency of other specified B group vitamins: Secondary | ICD-10-CM

## 2019-08-02 DIAGNOSIS — E039 Hypothyroidism, unspecified: Secondary | ICD-10-CM | POA: Diagnosis not present

## 2019-08-02 DIAGNOSIS — Z1231 Encounter for screening mammogram for malignant neoplasm of breast: Secondary | ICD-10-CM

## 2019-08-02 LAB — SEDIMENTATION RATE: Sed Rate: 37 mm/hr — ABNORMAL HIGH (ref 0–30)

## 2019-08-02 LAB — CBC WITH DIFFERENTIAL/PLATELET
Basophils Absolute: 0.1 10*3/uL (ref 0.0–0.1)
Basophils Relative: 1.4 % (ref 0.0–3.0)
Eosinophils Absolute: 0.3 10*3/uL (ref 0.0–0.7)
Eosinophils Relative: 3.4 % (ref 0.0–5.0)
HCT: 40.4 % (ref 36.0–46.0)
Hemoglobin: 13.8 g/dL (ref 12.0–15.0)
Lymphocytes Relative: 26.4 % (ref 12.0–46.0)
Lymphs Abs: 2.1 10*3/uL (ref 0.7–4.0)
MCHC: 34.1 g/dL (ref 30.0–36.0)
MCV: 92.6 fl (ref 78.0–100.0)
Monocytes Absolute: 0.4 10*3/uL (ref 0.1–1.0)
Monocytes Relative: 4.6 % (ref 3.0–12.0)
Neutro Abs: 5.2 10*3/uL (ref 1.4–7.7)
Neutrophils Relative %: 64.2 % (ref 43.0–77.0)
Platelets: 223 10*3/uL (ref 150.0–400.0)
RBC: 4.36 Mil/uL (ref 3.87–5.11)
RDW: 13.4 % (ref 11.5–15.5)
WBC: 8 10*3/uL (ref 4.0–10.5)

## 2019-08-02 LAB — BASIC METABOLIC PANEL
BUN: 13 mg/dL (ref 6–23)
CO2: 27 mEq/L (ref 19–32)
Calcium: 10.5 mg/dL (ref 8.4–10.5)
Chloride: 103 mEq/L (ref 96–112)
Creatinine, Ser: 1.73 mg/dL — ABNORMAL HIGH (ref 0.40–1.20)
GFR: 34.8 mL/min — ABNORMAL LOW (ref >60.00)
Glucose, Bld: 98 mg/dL (ref 70–99)
Potassium: 4.1 mEq/L (ref 3.5–5.1)
Sodium: 139 mEq/L (ref 135–145)

## 2019-08-02 LAB — MAGNESIUM: Magnesium: 2.2 mg/dL (ref 1.5–2.5)

## 2019-08-02 LAB — TSH: TSH: 26.5 u[IU]/mL — ABNORMAL HIGH (ref 0.35–4.50)

## 2019-08-02 LAB — VITAMIN B12: Vitamin B-12: 146 pg/mL — ABNORMAL LOW (ref 211–911)

## 2019-08-02 LAB — FOLATE: Folate: 8.5 ng/mL (ref >5.9)

## 2019-08-02 LAB — CK: Total CK: 85 U/L (ref 7–177)

## 2019-08-02 MED ORDER — LEVOTHYROXINE SODIUM 150 MCG PO TABS: 150.0000 ug | ORAL_TABLET | Freq: Every day | ORAL | 0 refills | Status: DC

## 2019-08-02 NOTE — Progress Notes (Signed)
Subjective:  Patient ID: Valerie Jackson, female    DOB: 20-Sep-1945  Age: 73 y.o. MRN: [id number]  CC: Hypertension, Hypothyroidism, and Hyperlipidemia   HPI [place] Drag[place] presents for f/up - She complains of a several month history of muscle aches.  It sounds like this is exacerbated by the statin.  The muscle aches interfere with her sleep and her daily activities.  Outpatient Medications Prior to Visit  Medication Sig Dispense Refill  . acetaminophen (TYLENOL) 500 MG tablet Take 1,000 mg by mouth every 6 (six) hours as needed for headache.    . ALPRAZolam (XANAX) 0.5 MG tablet Take 1 tablet (0.5 mg total) by mouth at bedtime as needed for anxiety. 180 tablet 1  . Biotin 10000 MCG TABS Take 1 tablet by mouth daily.    . Calcium Carbonate-Vit D-Min (CALCIUM 1200 PO) Take 1 tablet by mouth daily.    . cholecalciferol (VITAMIN D) 1000 UNITS tablet Take 2,000 Units by mouth daily.     . colestipol (COLESTID) 1 g tablet Take 2 tablets (2 g total) by mouth 2 (two) times daily. 120 tablet 3  . Fluticasone Furoate (ARNUITY ELLIPTA) 100 MCG/ACT AEPB Inhale 1 puff into the lungs daily. 90 each 3  . Magnesium 100 MG CAPS Take 1 capsule by mouth daily.    . methocarbamol (ROBAXIN) 500 MG tablet Take 1 tablet (500 mg total) by mouth every 8 (eight) hours as needed for muscle spasms. 60 tablet 3  . rizatriptan (MAXALT-MLT) 10 MG disintegrating tablet Take 1 tablet (10 mg total) by mouth as needed for migraine. May repeat in 2 hours if needed 30 tablet 1  . spironolactone (ALDACTONE) 25 MG tablet Take 0.5 tablets (12.5 mg total) by mouth daily. 45 tablet 1  . Zinc 100 MG TABS Take 1 tablet by mouth daily.    [name] levothyroxine (SYNTHROID) 112 MCG tablet TAKE 1 TABLET EVERY DAY 90 tablet 1  . rosuvastatin (CRESTOR) 10 MG tablet Take 10 mg by mouth daily.     No facility-administered medications prior to visit.     ROS Review of Systems  Constitutional: Negative for diaphoresis, fatigue and  unexpected weight change.  HENT: Negative.   Eyes: Negative for visual disturbance.  Respiratory: Negative for cough, chest tightness, shortness of breath and wheezing.   Gastrointestinal: Negative for abdominal pain, constipation, diarrhea, nausea and vomiting.  Genitourinary: Negative.  Negative for difficulty urinating.  Musculoskeletal: Positive for arthralgias and myalgias.  Skin: Negative for color change, pallor and rash.  Neurological: Negative.  Negative for dizziness, weakness, light-headedness and numbness.  Hematological: Negative for adenopathy. Does not bruise/bleed easily.  Psychiatric/Behavioral: Negative.     Objective:  BP (!) 156/102 (BP Location: Left Arm, Patient Position: Sitting, Cuff Size: Large)   Pulse 72   Temp 98.1 F (36.7 C) (Oral)   Resp 16   Ht 5' 5.5" (1.664 m)   Wt 211 lb (95.7 kg)   SpO2 99%   BMI 34.58 kg/m   BP Readings from Last 3 Encounters:  08/02/19 (!) 156/102  03/29/19 126/78  03/15/19 (!) 145/88    Wt Readings from Last 3 Encounters:  08/02/19 211 lb (95.7 kg)  03/29/19 216 lb 6.4 oz (98.2 kg)  03/15/19 215 lb (97.5 kg)    Physical Exam Constitutional:      Appearance: Normal appearance. She is obese.  HENT:     Nose: Nose normal.     Mouth/Throat:     Mouth: Mucous membranes are  moist.  Eyes:     General: No scleral icterus.    Conjunctiva/sclera: Conjunctivae normal.  Neck:     Musculoskeletal: Neck supple.  Cardiovascular:     Rate and Rhythm: Normal rate and regular rhythm.     Heart sounds: No murmur.  Pulmonary:     Effort: Pulmonary effort is normal.     Breath sounds: No stridor. No wheezing, rhonchi or rales.  Abdominal:     General: Abdomen is protuberant. Bowel sounds are normal. There is no distension.     Palpations: There is no hepatomegaly or splenomegaly.     Tenderness: There is no abdominal tenderness.  Musculoskeletal: Normal range of motion.     Right lower leg: No edema.     Left lower leg:  No edema.  Lymphadenopathy:     Cervical: No cervical adenopathy.  Skin:    General: Skin is warm and dry.     Coloration: Skin is not pale.  Neurological:     General: No focal deficit present.     Mental Status: She is alert.  Psychiatric:        Mood and Affect: Mood normal.        Behavior: Behavior normal.     Lab Results  Component Value Date   WBC 8.0 08/02/2019   HGB 13.8 08/02/2019   HCT 40.4 08/02/2019   PLT 223.0 08/02/2019   GLUCOSE 98 08/02/2019   CHOL 210 (H) 01/16/2019   TRIG 160.0 (H) 01/16/2019   HDL 47.60 01/16/2019   LDLDIRECT 226.0 02/27/2013   LDLCALC 130 (H) 01/16/2019   ALT 23 01/16/2019   AST 17 01/16/2019   NA 139 08/02/2019   K 4.1 08/02/2019   CL 103 08/02/2019   CREATININE 1.73 (H) 08/02/2019   BUN 13 08/02/2019   CO2 27 08/02/2019   TSH 26.50 (H) 08/02/2019   INR 1.02 06/24/2017   HGBA1C 4.7 05/14/2014    US Renal  Result Date: 02/15/2019 CLINICAL DATA:  Stage III chronic kidney disease, hypertension EXAM: RENAL / URINARY TRACT ULTRASOUND COMPLETE COMPARISON:  CT abdomen and pelvis 04/01/2017 FINDINGS: Right Kidney: Renal measurements: 10.9 x 4.5 x 4.1 cm = volume: 105 mL. Normal cortical thickness and echogenicity. No mass or hydronephrosis. No shadowing calcification. Left Kidney: Renal measurements: 12.1 x 4.0 x 4.0 cm = volume: 102 mL. Normal cortical thickness. Upper normal cortical echogenicity. No mass or hydronephrosis. No shadowing calculi. Bladder: Contains only a small amount of urine, grossly unremarkable. Incidentally noted: Echogenic parenchyma, likely fatty infiltration though this can be seen with cirrhosis and certain infiltrative disorders. IMPRESSION: No renal sonographic abnormalities identified. Question fatty infiltration of liver as above. Electronically Signed   By: Lavonia Dana M.D.   On: 02/15/2019 17:50    Assessment & Plan:   [name] was seen today for hypertension, hypothyroidism and hyperlipidemia.  Diagnoses  and all orders for this visit:  Acquired hypothyroidism- Her TSH is up to 26.  I recommended that she increase her dose of levothyroxine. -     TSH; Future -     levothyroxine (SYNTHROID) 150 MCG tablet; Take 1 tablet (150 mcg total) by mouth daily before breakfast.  Hyperparathyroidism ([place])- Her calcium level is normal -     Basic metabolic panel; Future  CKD (chronic kidney disease) stage 2, GFR 60-89 ml/min-her renal function has improved.  She will avoid nephrotoxic agents. -     Basic metabolic panel; Future  Essential hypertension- Her blood pressure is not adequately  well controlled.  She is not willing to add another antihypertensive.  She agrees to improve her lifestyle modifications and to return in 2 to 3 months for blood pressure recheck. -     Basic metabolic panel; Future  B12 deficiency- I have asked her to restart monthly B12 injections. -     CBC with Differential; Future -     B12; Future -     Folate; Future  Myalgia- She complains of myalgias but her labs are negative for myositis or inflammatory myopathy.  This is likely caused by the statin so have asked her to stop taking it and to see if the muscle aches improved. -     Magnesium; Future -     Sedimentation rate; Future -     CK (Creatine Kinase); Future  Visit for screening mammogram -     MM Digital Screening; Future   I have discontinued [name] R. Hosack's levothyroxine and rosuvastatin. I am also having her start on levothyroxine. Additionally, I am having her maintain her cholecalciferol, acetaminophen, rizatriptan, methocarbamol, Calcium Carbonate-Vit D-Min (CALCIUM 1200 PO), Magnesium, spironolactone, colestipol, Arnuity Ellipta, Zinc, Biotin, and ALPRAZolam.  Meds ordered this encounter  Medications  . levothyroxine (SYNTHROID) 150 MCG tablet    Sig: Take 1 tablet (150 mcg total) by mouth daily before breakfast.    Dispense:  90 tablet    Refill:  0     Follow-up: Return in about 3 months  (around 11/02/2019).  [name], MD

## 2019-08-02 NOTE — Patient Instructions (Signed)

## 2019-08-07 ENCOUNTER — Other Ambulatory Visit: Payer: Self-pay | Admitting: Internal Medicine

## 2019-08-07 ENCOUNTER — Encounter: Payer: Self-pay | Admitting: Internal Medicine

## 2019-08-07 DIAGNOSIS — F418 Other specified anxiety disorders: Secondary | ICD-10-CM

## 2019-08-07 DIAGNOSIS — I10 Essential (primary) hypertension: Secondary | ICD-10-CM

## 2019-08-07 MED ORDER — INDAPAMIDE 1.25 MG PO TABS: 1.2500 mg | ORAL_TABLET | Freq: Every day | ORAL | 0 refills | Status: DC

## 2019-08-07 MED ORDER — ALPRAZOLAM 0.5 MG PO TABS: 0.5000 mg | ORAL_TABLET | Freq: Every evening | ORAL | 5 refills | Status: DC | PRN

## 2019-08-07 NOTE — Telephone Encounter (Signed)
Pt contacted and informed diuretic was changed to indapamide. Pt stated understanding and will continue monitoring her BP.

## 2019-08-07 NOTE — Telephone Encounter (Signed)
Reason for CRM: Patient called to request a call back from Dr Ronnald Ramp or his nurse due to elevated BP. See My Chart message, can be reached at Ph# [phone]

## 2019-08-08 DIAGNOSIS — R42 Dizziness and giddiness: Secondary | ICD-10-CM | POA: Diagnosis not present

## 2019-08-08 DIAGNOSIS — I10 Essential (primary) hypertension: Secondary | ICD-10-CM | POA: Diagnosis not present

## 2019-08-08 DIAGNOSIS — R55 Syncope and collapse: Secondary | ICD-10-CM | POA: Diagnosis not present

## 2019-08-08 DIAGNOSIS — I4510 Unspecified right bundle-branch block: Secondary | ICD-10-CM | POA: Diagnosis not present

## 2019-08-08 DIAGNOSIS — R61 Generalized hyperhidrosis: Secondary | ICD-10-CM | POA: Diagnosis not present

## 2019-08-09 ENCOUNTER — Other Ambulatory Visit: Payer: Self-pay

## 2019-08-09 ENCOUNTER — Ambulatory Visit: Payer: Medicare Other | Admitting: Internal Medicine

## 2019-08-09 DIAGNOSIS — I4510 Unspecified right bundle-branch block: Secondary | ICD-10-CM | POA: Diagnosis not present

## 2019-08-09 MED ORDER — COLESTIPOL HCL 1 G PO TABS: 2.0000 g | ORAL_TABLET | Freq: Two times a day (BID) | ORAL | 3 refills | Status: DC

## 2019-08-16 ENCOUNTER — Other Ambulatory Visit: Payer: Self-pay

## 2019-08-16 ENCOUNTER — Ambulatory Visit (INDEPENDENT_AMBULATORY_CARE_PROVIDER_SITE_OTHER): Payer: Medicare Other | Admitting: *Deleted

## 2019-08-16 ENCOUNTER — Telehealth: Payer: Self-pay | Admitting: *Deleted

## 2019-08-16 VITALS — BP 112/88

## 2019-08-16 DIAGNOSIS — E538 Deficiency of other specified B group vitamins: Secondary | ICD-10-CM | POA: Diagnosis not present

## 2019-08-16 MED ORDER — CYANOCOBALAMIN 1000 MCG/ML IJ SOLN: 1000.0000 ug | INTRAMUSCULAR | 0 refills | Status: DC

## 2019-08-16 MED ORDER — CYANOCOBALAMIN 1000 MCG/ML IJ SOLN
1000.0000 ug | Freq: Once | INTRAMUSCULAR | Status: AC
  Administered 2019-08-16: 1000 ug via INTRAMUSCULAR

## 2019-08-16 MED ORDER — "SYRINGE/NEEDLE (DISP) 25G X 5/8"" 3 ML MISC": 1.0000 | 0 refills | Status: DC

## 2019-08-16 NOTE — Progress Notes (Signed)
I have reviewed and agree.

## 2019-08-16 NOTE — Telephone Encounter (Signed)
I am okay with this Do you need me to send it?  TJ

## 2019-08-16 NOTE — Telephone Encounter (Signed)
Patient came in office today for b12 injection. She states she can do these injections herself at home. She is requesting rxs for syringes, SQ needles along with vit b12 medication be sent to [facility] mail order. Please advise.

## 2019-08-16 NOTE — Telephone Encounter (Signed)
Rxs sent. See meds.

## 2019-08-18 ENCOUNTER — Ambulatory Visit: Payer: Medicare Other

## 2019-09-18 ENCOUNTER — Telehealth: Payer: Self-pay

## 2019-09-18 NOTE — Telephone Encounter (Signed)
Copied from [place] [id number]. Topic: General - Inquiry >> Sep 18, 2019  3:26 PM Richardo Priest, NT wrote: Reason for CRM: [facility] with a Dental office in [facility] [place] called in stating a form was faxed 12/31 for a clearance of patient to receive surgery. Office is still waiting for a response and would like that faxed back to [phone], attention [place]. Please advise.

## 2019-09-19 NOTE — Telephone Encounter (Signed)
Forms have been faxed back.

## 2019-09-19 NOTE — Telephone Encounter (Signed)
Called [place] at [address] an alternate number ([phone]) to fax to and requested that she fax to the main number ([phone]) as well. [place] agreed.

## 2019-09-19 NOTE — Telephone Encounter (Signed)
Fax from dentist received.

## 2019-09-20 NOTE — Telephone Encounter (Signed)
Spoke to dental office and they confirmed that they received the forms that I faxed yesterday.

## 2019-09-24 ENCOUNTER — Other Ambulatory Visit: Payer: Self-pay

## 2019-09-24 ENCOUNTER — Emergency Department (HOSPITAL_COMMUNITY): Payer: Medicare Other

## 2019-09-24 ENCOUNTER — Emergency Department (HOSPITAL_COMMUNITY)
Admission: EM | Admit: 2019-09-24 | Discharge: 2019-09-25 | Disposition: A | Discharge status: Home / Self Care | Payer: Medicare Other | Attending: Emergency Medicine | Admitting: Emergency Medicine

## 2019-09-24 ENCOUNTER — Encounter (HOSPITAL_COMMUNITY): Payer: Self-pay | Admitting: Emergency Medicine

## 2019-09-24 DIAGNOSIS — R011 Cardiac murmur, unspecified: Secondary | ICD-10-CM | POA: Insufficient documentation

## 2019-09-24 DIAGNOSIS — Z87891 Personal history of nicotine dependence: Secondary | ICD-10-CM | POA: Diagnosis not present

## 2019-09-24 DIAGNOSIS — R079 Chest pain, unspecified: Secondary | ICD-10-CM | POA: Diagnosis not present

## 2019-09-24 DIAGNOSIS — I130 Hypertensive heart and chronic kidney disease with heart failure and stage 1 through stage 4 chronic kidney disease, or unspecified chronic kidney disease: Secondary | ICD-10-CM | POA: Insufficient documentation

## 2019-09-24 DIAGNOSIS — Z888 Allergy status to other drugs, medicaments and biological substances status: Secondary | ICD-10-CM | POA: Diagnosis not present

## 2019-09-24 DIAGNOSIS — Z20822 Contact with and (suspected) exposure to covid-19: Secondary | ICD-10-CM | POA: Insufficient documentation

## 2019-09-24 DIAGNOSIS — Z79899 Other long term (current) drug therapy: Secondary | ICD-10-CM | POA: Diagnosis not present

## 2019-09-24 DIAGNOSIS — N182 Chronic kidney disease, stage 2 (mild): Secondary | ICD-10-CM | POA: Insufficient documentation

## 2019-09-24 DIAGNOSIS — J449 Chronic obstructive pulmonary disease, unspecified: Secondary | ICD-10-CM | POA: Diagnosis not present

## 2019-09-24 DIAGNOSIS — I509 Heart failure, unspecified: Secondary | ICD-10-CM | POA: Diagnosis not present

## 2019-09-24 DIAGNOSIS — M542 Cervicalgia: Secondary | ICD-10-CM | POA: Diagnosis not present

## 2019-09-24 DIAGNOSIS — E782 Mixed hyperlipidemia: Secondary | ICD-10-CM | POA: Diagnosis not present

## 2019-09-24 DIAGNOSIS — E039 Hypothyroidism, unspecified: Secondary | ICD-10-CM | POA: Diagnosis not present

## 2019-09-24 DIAGNOSIS — R0789 Other chest pain: Secondary | ICD-10-CM

## 2019-09-24 DIAGNOSIS — M7918 Myalgia, other site: Secondary | ICD-10-CM

## 2019-09-24 DIAGNOSIS — M545 Low back pain: Secondary | ICD-10-CM | POA: Diagnosis not present

## 2019-09-24 LAB — BASIC METABOLIC PANEL
Anion gap: 10 (ref 5–15)
BUN: 16 mg/dL (ref 8–23)
CO2: 28 mmol/L (ref 22–32)
Calcium: 10 mg/dL (ref 8.9–10.3)
Chloride: 101 mmol/L (ref 98–111)
Creatinine, Ser: 1.75 mg/dL — ABNORMAL HIGH (ref 0.44–1.00)
GFR calc Af Amer: 33 mL/min — ABNORMAL LOW (ref >60)
GFR calc non Af Amer: 28 mL/min — ABNORMAL LOW (ref >60)
Glucose, Bld: 117 mg/dL — ABNORMAL HIGH (ref 70–99)
Potassium: 3.6 mmol/L (ref 3.5–5.1)
Sodium: 139 mmol/L (ref 135–145)

## 2019-09-24 LAB — CBC
HCT: 39 % (ref 36.0–46.0)
Hemoglobin: 12.9 g/dL (ref 12.0–15.0)
MCH: 31.2 pg (ref 26.0–34.0)
MCHC: 33.1 g/dL (ref 30.0–36.0)
MCV: 94.2 fL (ref 80.0–100.0)
Platelets: 198 10*3/uL (ref 150–400)
RBC: 4.14 MIL/uL (ref 3.87–5.11)
RDW: 11.9 % (ref 11.5–15.5)
WBC: 9.8 10*3/uL (ref 4.0–10.5)
nRBC: 0 % (ref 0.0–0.2)

## 2019-09-24 LAB — TROPONIN I (HIGH SENSITIVITY): Troponin I (High Sensitivity): <2 ng/L (ref <18)

## 2019-09-24 MED ORDER — MORPHINE SULFATE (PF) 4 MG/ML IV SOLN
4.0000 mg | Freq: Once | INTRAVENOUS | Status: AC
  Administered 2019-09-24: 4 mg via INTRAVENOUS
  Filled 2019-09-24: qty 1

## 2019-09-24 MED ORDER — SODIUM CHLORIDE 0.9% FLUSH: 3.0000 mL | Freq: Once | INTRAVENOUS | Status: DC

## 2019-09-24 MED ORDER — ONDANSETRON HCL 4 MG/2ML IJ SOLN
4.0000 mg | Freq: Once | INTRAMUSCULAR | Status: DC
  Filled 2019-09-24: qty 2

## 2019-09-24 MED ORDER — SODIUM CHLORIDE 0.9 % IV BOLUS
500.0000 mL | Freq: Once | INTRAVENOUS | Status: AC
  Administered 2019-09-25: 500 mL via INTRAVENOUS

## 2019-09-24 MED ORDER — LIDOCAINE 5 % EX PTCH
2.0000 | MEDICATED_PATCH | CUTANEOUS | Status: DC
  Administered 2019-09-24: 2 via TRANSDERMAL
  Filled 2019-09-24 (×2): qty 2

## 2019-09-24 NOTE — ED Provider Notes (Signed)
Right shest, shoulder, neck pain Worse with movement, palpation Chest pain work up Likely muscular Morphine, lidoderm given   Recheck/re-exam: patient reports continuous severe discomfort after Ativan, Morphine, lidoderm patch.   Talked more about her symptoms: right neck to superior shoulder into right upper chest, down parasternum to low chest and under breast. Tender to touch. Worse with movement but also constant pain, sharp. No rash, redness. Does not radiate to back.   She reports her breathing is guarded because of the pain. She has a baseline DOE that she does not feel is worse. No cough.   She also reports a sister with h/o DVT, mother with PE. Given persistent tachycardia for entire duration of visit, no other cause of pain identified, that PE must be considered despite pain following muscular pattern and no hypoxia. D-dimer is elevated 0.86. She has renal insufficiency that would prevent CTA. Will obtain VQ scan in am. Patient is comfortable after Toradol, reporting pain improved (still tachy) and is agreeable to staying until morning to have study. Lovenox provided.   Patient care signed out to [facility], [place], pending VQ scan results and appropriate disposition.    Charlann Lange, PA-C 09/30/19 0615    Carmin Muskrat, MD 10/02/19 [phone]

## 2019-09-24 NOTE — ED Provider Notes (Signed)
[place] DEPT Provider Note   CSN: [id number] Arrival date & time: 09/24/19  2104     History Chief Complaint  Patient presents with  . Chest Pain    Valerie Jackson is a 74 y.o. female.  74 year old female presents with complaint of right side chest and neck pain.  Patient states her pain started 2:00 this afternoon while she was resting.  Pain is cramping and sharp in nature, worse with palpation of the right shoulder, right chest, right side neck, also worse with movement and taking deep breaths.  Patient denies fevers, chills, abdominal pain, nausea, vomiting, diaphoresis.  No reported falls or injuries.  Denies similar pain previously.  Patient has a history of hypertension, hyperlipidemia, CHF, stage II CKD, COPD.  No significant family cardiac history.  No other complaints or concerns.        Past Medical History:  Diagnosis Date  . Arthritis    fingers  . Blood clot in vein 1968  . Cataracts, bilateral   . CHF (congestive heart failure) ([place])   . Chronic kidney disease    CKD stage 2  . COPD (chronic obstructive pulmonary disease) ([place])   . Depression   . Diverticulitis 09/2013   with perforation  . Dizziness    with decreased BP readings or bending over  . Floaters   . GERD (gastroesophageal reflux disease)   . H/O hiatal hernia   . Headache(784.0)    migraine every 5- to 6 years  . Heart murmur since age 40   mild  . Hepatitis age 67 or 39   had heapatitis, not sure which kind  . History of blood transfusion 1965  . History of frequent urinary tract infections   . Hypercholesteremia   . Hypertension   . Hypothyroid   . Obesity   . Pericolonic abscess 2015  . Pneumonia   . Sickle cell trait [facility])     Patient Active Problem List   Diagnosis Date Noted  . B12 deficiency 11/21/2018  . Myalgia 03/18/2018  . Asthma 08/19/2017  . Eczema 06/29/2017  . Hyperparathyroidism ([place]) 04/07/2017  . Migraine without aura and  without status migrainosus, not intractable 11/11/2016  . Wellness examination 10/05/2016  . Symptomatic PVCs 05/19/2016  . Osteopenia 08/23/2015  . Insomnia due to anxiety and fear 09/18/2014  . Esophageal reflux 04/06/2014  . Depression with anxiety 04/06/2014  . CKD (chronic kidney disease) stage 2, GFR 60-89 ml/min 03/03/2014  . Emphysema lung ([place]) 11/08/2013  . Sickle cell trait ([place]) 07/19/2013  . Obesity (BMI 30-39.9) 04/12/2013  . Hyperlipidemia with target LDL less than 160 02/28/2013  . Hypertension 02/28/2013  . Hypothyroidism 02/28/2013    Past Surgical History:  Procedure Laterality Date  . CHOLECYSTECTOMY  2002  . COLON RESECTION N/A 10/11/2013   Procedure: EXPLORATORY LAPAROTOMY WITH RECTOSIGMOID COLECTOMY AND END COLOSTOMY;  Surgeon: Stark Klein, MD;  Location: [facility];  Service: General;  Laterality: N/A;  . COLOSTOMY TAKEDOWN N/A 02/27/2014   Procedure: COLOSTOMY TAKEDOWN [name] HERNIA REPAIR;  Surgeon: Stark Klein, MD;  Location: [facility];  Service: General;  Laterality: N/A;  . INCISIONAL HERNIA REPAIR N/A 03/04/2016   Procedure: LAPAROSCOPIC INCISIONAL HERNIA WITH MESH;  Surgeon: Ralene Ok, MD;  Location: [facility];  Service: General;  Laterality: N/A;  . LAPAROSCOPIC LYSIS OF ADHESIONS N/A 03/04/2016   Procedure: LAPAROSCOPIC LYSIS OF ADHESIONS;  Surgeon: Ralene Ok, MD;  Location: [facility];  Service: General;  Laterality: N/A;  . RIGHT  HEART CATH N/A 06/24/2017   Procedure: RIGHT HEART CATH;  Surgeon: Jolaine Artist, MD;  Location: [place] CV LAB;  Service: Cardiovascular;  Laterality: N/A;  . TONSILLECTOMY  1952     OB History    Gravida  1   Para  1   Term      Preterm      AB      Living  2     SAB      TAB      Ectopic      Multiple      Live Births  1           Family History  Problem Relation Age of Onset  . Hypertension Mother   . Aneurysm Mother        Cerebral (died from this)  . Cancer Mother         Uterine  . Cancer Other   . Arthritis Sister   . Hyperlipidemia Sister   . Hypertension Sister   . Stroke Sister   . Kidney disease Sister        Cancer  . Hyperlipidemia Brother   . Glaucoma Brother   . Thyroid cancer Daughter   . Breast cancer Maternal Grandmother 78  . Hypertension Maternal Grandmother   . Hypercalcemia Neg Hx     Social History   Tobacco Use  . Smoking status: Former Smoker    Packs/day: 0.50    Years: 52.00    Pack years: 26.00    Types: Cigarettes    Quit date: 09/06/2012    Years since quitting: 7.0  . Smokeless tobacco: Never Used  . Tobacco comment: never smoked a full PPD.   Substance Use Topics  . Alcohol use: No    Alcohol/week: 0.0 standard drinks    Comment: very rarely  . Drug use: No    Home Medications Prior to Admission medications   Medication Sig Start Date End Date Taking? Authorizing Provider  acetaminophen (TYLENOL) 500 MG tablet Take 1,000 mg by mouth every 6 (six) hours as needed for headache.    [provider]  ALPRAZolam Duanne Moron) 0.5 MG tablet Take 1 tablet (0.5 mg total) by mouth at bedtime as needed for anxiety. 08/07/19   Janith Lima, MD  Biotin 10000 MCG TABS Take 1 tablet by mouth daily.    [provider]  Calcium Carbonate-Vit D-Min (CALCIUM 1200 PO) Take 1 tablet by mouth daily.    [provider]  cholecalciferol (VITAMIN D) 1000 UNITS tablet Take 2,000 Units by mouth daily.     [provider]  colestipol (COLESTID) 1 g tablet Take 2 tablets (2 g total) by mouth 2 (two) times daily. 08/09/19   Irene Shipper, MD  cyanocobalamin (,VITAMIN B-12,) 1000 MCG/ML injection Inject 1 mL (1,000 mcg total) into the muscle every 30 (thirty) days. 08/16/19   Janith Lima, MD  Fluticasone Furoate (ARNUITY ELLIPTA) 100 MCG/ACT AEPB Inhale 1 puff into the lungs daily. 03/29/19   Parrett, Fonnie Mu, NP  indapamide (LOZOL) 1.25 MG tablet Take 1 tablet (1.25 mg total) by mouth daily. 08/07/19    Janith Lima, MD  levothyroxine (SYNTHROID) 150 MCG tablet Take 1 tablet (150 mcg total) by mouth daily before breakfast. 08/02/19   Janith Lima, MD  Magnesium 100 MG CAPS Take 1 capsule by mouth daily.    [provider]  methocarbamol (ROBAXIN) 500 MG tablet Take 1 tablet (500 mg total) by  mouth every 8 (eight) hours as needed for muscle spasms. 09/28/18   Janith Lima, MD  rizatriptan (MAXALT-MLT) 10 MG disintegrating tablet Take 1 tablet (10 mg total) by mouth as needed for migraine. May repeat in 2 hours if needed 09/28/18   Janith Lima, MD  SYRINGE-NEEDLE, DISP, 3 ML 25G X 5/8" 3 ML MISC 1 each by Does not apply route every 30 (thirty) days. 08/16/19   Janith Lima, MD  Zinc 100 MG TABS Take 1 tablet by mouth daily.    [provider]    Allergies    Crestor [rosuvastatin] and Estrogens  Review of Systems   Review of Systems  Constitutional: Negative for chills, diaphoresis and fever.  Respiratory: Negative for shortness of breath.   Cardiovascular: Positive for chest pain. Negative for palpitations and leg swelling.  Gastrointestinal: Negative for abdominal pain, nausea and vomiting.  Musculoskeletal: Positive for back pain and neck pain.  Skin: Negative for rash and wound.  Allergic/Immunologic: Negative for immunocompromised state.  Neurological: Negative for weakness.  Psychiatric/Behavioral: Negative for confusion.  All other systems reviewed and are negative.   Physical Exam Updated Vital Signs BP (!) 143/96   Pulse (!) 112   Temp 99.1 F (37.3 C) (Oral)   Resp (!) 21   Ht 5' 5"  (1.651 m)   Wt 93 kg   SpO2 96%   BMI 34.11 kg/m   Physical Exam Vitals and nursing note reviewed.  Constitutional:      General: She is not in acute distress.    Appearance: She is well-developed. She is not diaphoretic.  HENT:     Head: Normocephalic and atraumatic.  Cardiovascular:     Rate and Rhythm: Regular rhythm. Tachycardia present.     Heart  sounds: Normal heart sounds.  Pulmonary:     Effort: Pulmonary effort is normal.     Breath sounds: Normal breath sounds.  Chest:     Chest wall: Tenderness present.    Abdominal:     Palpations: Abdomen is soft.     Tenderness: There is no abdominal tenderness.  Musculoskeletal:       Back:     Right lower leg: No edema.     Left lower leg: No edema.     Comments: Pain with palpation right trapezius and with movement of the right shoulder. Radial pulses normal  Skin:    General: Skin is warm and dry.     Findings: No erythema or rash.  Neurological:     Mental Status: She is alert and oriented to person, place, and time.  Psychiatric:        Behavior: Behavior normal.     ED Results / Procedures / Treatments   Labs (all labs ordered are listed, but only abnormal results are displayed) Labs Reviewed  BASIC METABOLIC PANEL - Abnormal; Notable for the following components:      Result Value   Glucose, Bld 117 (*)    Creatinine, Ser 1.75 (*)    GFR calc non Af Amer 28 (*)    GFR calc Af Amer 33 (*)    All other components within normal limits  RESPIRATORY PANEL BY RT PCR (FLU A&B, COVID)  CBC  TROPONIN I (HIGH SENSITIVITY)  TROPONIN I (HIGH SENSITIVITY)    EKG EKG Interpretation  Date/Time:  Sunday September 24 2019 21:10:20 EST Ventricular Rate:  112 PR Interval:    QRS Duration: 90 QT Interval:  317 QTC Calculation: 435 R Axis:   -  45 Text Interpretation: Sinus tachycardia Left axis deviation Abnormal R-wave progression, early transition T wave abnormality Baseline wander Abnormal ECG Confirmed by Carmin Muskrat [phone]) on 09/24/2019 9:17:54 PM   Radiology DG Chest 2 View  Result Date: 09/24/2019 CLINICAL DATA:  Chest pain EXAM: CHEST - 2 VIEW COMPARISON:  09/28/2018 FINDINGS: Heart and mediastinal contours are within normal limits. No focal opacities or effusions. No acute bony abnormality. IMPRESSION: No active cardiopulmonary disease. Electronically Signed    By: Rolm Baptise M.D.   On: 09/24/2019 21:45    Procedures Procedures (including critical care time)  Medications Ordered in ED Medications  sodium chloride flush (NS) 0.9 % injection 3 mL (has no administration in time range)  ondansetron (ZOFRAN) injection 4 mg (4 mg Intravenous Refused 09/24/19 2216)  lidocaine (LIDODERM) 5 % 2 patch (2 patches Transdermal Patch Applied 09/24/19 2337)  sodium chloride 0.9 % bolus 500 mL (has no administration in time range)  LORazepam (ATIVAN) injection 0.5 mg (has no administration in time range)  morphine 4 MG/ML injection 4 mg (4 mg Intravenous Given 09/24/19 2157)  morphine 4 MG/ML injection 4 mg (4 mg Intravenous Given 09/24/19 2339)    ED Course  I have reviewed the triage vital signs and the nursing notes.  Pertinent labs & imaging results that were available during my care of the patient were reviewed by me and considered in my medical decision making (see chart for details).  Clinical Course as of Sep 24 4  Sun Jan 10, 622  6831 74 year old female presents with complaint of right upper chest and shoulder pain as well as right side neck pain.  On exam, patient has tenderness patient with right chest, right trapezius, worse with palpation worse with movement pain. Abdomen is soft and non tender. EKG without ischemic changes, CBC WNL, BMP without significant changes (history of CKD). Troponin x 1 negative.  Patient was seen by Dr. Vanita Panda, ER attending, suspect likely musculoskeletal pain. Plan is for pain medicine and repeat troponin.  Patient was given morphine and lidoderm patch.   [LM]  Mon Sep 25, 2019  0004 On recheck, patient remains very uncomfortable, reports limited relief with morphine. Patient remains slightly tachycardic at a rate of 107, suspect this is secondary to her pain. Plan is to give Ativan, recheck troponin, follow for pain control. Care signed out to oncoming provider at change of shift.    [LM]    Clinical Course  User Index [LM] Roque Lias   MDM Rules/Calculators/A&P                      Final Clinical Impression(s) / ED Diagnoses Final diagnoses:  Chest wall pain  Musculoskeletal pain    Rx / DC Orders ED Discharge Orders    None       Roque Lias 09/25/19 0006    Carmin Muskrat, MD 09/27/19 2237

## 2019-09-24 NOTE — ED Triage Notes (Signed)
Pt reports having right arm pain with radiation into chest and neck that started around 1000 today. Pt reports taking BC powder and muscle relaxer with minimal relief earlier today around 1400.

## 2019-09-25 ENCOUNTER — Emergency Department (HOSPITAL_COMMUNITY): Payer: Medicare Other

## 2019-09-25 DIAGNOSIS — R079 Chest pain, unspecified: Secondary | ICD-10-CM | POA: Diagnosis not present

## 2019-09-25 DIAGNOSIS — R0789 Other chest pain: Secondary | ICD-10-CM | POA: Diagnosis not present

## 2019-09-25 LAB — D-DIMER, QUANTITATIVE: D-Dimer, Quant: 0.86 ug/mL-FEU — ABNORMAL HIGH (ref 0.00–0.50)

## 2019-09-25 LAB — RESPIRATORY PANEL BY RT PCR (FLU A&B, COVID)
Influenza A by PCR: NEGATIVE
Influenza B by PCR: NEGATIVE
SARS Coronavirus 2 by RT PCR: NEGATIVE

## 2019-09-25 LAB — TROPONIN I (HIGH SENSITIVITY): Troponin I (High Sensitivity): <2 ng/L (ref <18)

## 2019-09-25 MED ORDER — KETOROLAC TROMETHAMINE 15 MG/ML IJ SOLN
15.0000 mg | Freq: Once | INTRAMUSCULAR | Status: AC
  Administered 2019-09-25: 15 mg via INTRAVENOUS
  Filled 2019-09-25: qty 1

## 2019-09-25 MED ORDER — TIZANIDINE HCL 4 MG PO TABS: 4.0000 mg | ORAL_TABLET | Freq: Four times a day (QID) | ORAL | 0 refills | Status: DC | PRN

## 2019-09-25 MED ORDER — LORAZEPAM 2 MG/ML IJ SOLN
0.5000 mg | Freq: Once | INTRAMUSCULAR | Status: AC
  Administered 2019-09-25: 0.5 mg via INTRAVENOUS
  Filled 2019-09-25: qty 1

## 2019-09-25 MED ORDER — ACETAMINOPHEN 500 MG PO TABS: 500.0000 mg | ORAL_TABLET | Freq: Four times a day (QID) | ORAL | 0 refills | Status: DC | PRN

## 2019-09-25 MED ORDER — ENOXAPARIN SODIUM 100 MG/ML ~~LOC~~ SOLN
1.0000 mg/kg | Freq: Once | SUBCUTANEOUS | Status: AC
  Administered 2019-09-25: 95 mg via SUBCUTANEOUS
  Filled 2019-09-25: qty 0.95

## 2019-09-25 NOTE — ED Notes (Signed)
Pt refused Toradol at this time and stated she just wanted to get some rest.

## 2019-09-25 NOTE — Discharge Instructions (Signed)
You may use [phone] mg of tylenol every 6H as needed. Use zanaflex every 6 hours as needed for pain.

## 2019-09-25 NOTE — ED Notes (Signed)
unsuccessfully Attempted to drawl second troponin second RN will attempt.

## 2019-09-25 NOTE — ED Provider Notes (Signed)
Accepted handoff at shift change from [facility]. Please see prior provider note for more detail.   Briefly: Patient is 74 y.o.  "74 year old female presents with complaint of right side chest and neck pain.  Patient states her pain started 2:00 this afternoon while she was resting.  Pain is cramping and sharp in nature, worse with palpation of the right shoulder, right chest, right side neck, also worse with movement and taking deep breaths.  Patient denies fevers, chills, abdominal pain, nausea, vomiting, diaphoresis.  No reported falls or injuries.  Denies similar pain previously.  Patient has a history of hypertension, hyperlipidemia, CHF, stage II CKD, COPD.  No significant family cardiac history.  No other complaints or concerns."  Patient has a family history of sister with DVT mother of PE.  States that pain is worse with breathing.  In ED she was given morphine with no improvement and Toradol with significant improvement.  She is now symptom-free.  DDX: concern for an embolism.  Plan: Rule out pulmonary embolism with VQ scan-discharged with muscle relaxers and symptomatic treatment if negative for PE.    Physical Exam  BP 130/88   Pulse 99   Temp 99.1 F (37.3 C) (Oral)   Resp 17   Ht 5' 5"  (1.651 m)   Wt 93 kg   SpO2 96%   BMI 34.11 kg/m    CONSTITUTIONAL:  well-appearing, NAD NEURO:  Alert and oriented x 3, no focal deficits EYES:  pupils equal and reactive ENT/NECK:  trachea midline, no JVD CARDIO:  reg rate, reg rhythm, well-perfused, pulse is 80 on my evaluation. PULM:  None labored breathing, lungs clear to auscultation bilaterally.  Patient is satting 90% on room air no increased work of breathing. GI/GU:  Abdomen non-distended MSK/SPINE:  No gross deformities, no edema.  Right lateral neck and right trapezius tenderness to palpation with multiple trigger points.  She also has reproducible chest wall tenderness on the right side of anterior chest.  SKIN:  no  rash obvious, atraumatic, no ecchymosis  PSYCH:  Appropriate speech and behavior   ED Course/Procedures   Clinical Course as of Sep 25 1239  Sun Sep 23, 4921  237 74 year old female presents with complaint of right upper chest and shoulder pain as well as right side neck pain.  On exam, patient has tenderness patient with right chest, right trapezius, worse with palpation worse with movement pain. Abdomen is soft and non tender. EKG without ischemic changes, CBC WNL, BMP without significant changes (history of CKD). Troponin x 1 negative.  Patient was seen by Dr. Vanita Panda, ER attending, suspect likely musculoskeletal pain. Plan is for pain medicine and repeat troponin.  Patient was given morphine and lidoderm patch.   [LM]  Mon Sep 25, 2019  0004 On recheck, patient remains very uncomfortable, reports limited relief with morphine. Patient remains slightly tachycardic at a rate of 107, suspect this is secondary to her pain. Plan is to give Ativan, recheck troponin, follow for pain control. Care signed out to oncoming provider at change of shift.    [LM]    Clinical Course User Index [LM] Tacy Learn, PA-C   Results for orders placed or performed during the hospital encounter of 09/24/19  Respiratory Panel by RT PCR (Flu A&B, Covid) - Nasopharyngeal Swab   Specimen: Nasopharyngeal Swab  Result Value Ref Range   SARS Coronavirus 2 by RT PCR NEGATIVE NEGATIVE   Influenza A by PCR NEGATIVE NEGATIVE   Influenza B by  PCR NEGATIVE NEGATIVE  Basic metabolic panel  Result Value Ref Range   Sodium 139 135 - 145 mmol/L   Potassium 3.6 3.5 - 5.1 mmol/L   Chloride 101 98 - 111 mmol/L   CO2 28 22 - 32 mmol/L   Glucose, Bld 117 (H) 70 - 99 mg/dL   BUN 16 8 - 23 mg/dL   Creatinine, Ser 1.75 (H) 0.44 - 1.00 mg/dL   Calcium 10.0 8.9 - 10.3 mg/dL   GFR calc non Af Amer 28 (L) >60 mL/min   GFR calc Af Amer 33 (L) >60 mL/min   Anion gap 10 5 - 15  CBC  Result Value Ref Range   WBC 9.8 4.0 -  10.5 K/uL   RBC 4.14 3.87 - 5.11 MIL/uL   Hemoglobin 12.9 12.0 - 15.0 g/dL   HCT 39.0 36.0 - 46.0 %   MCV 94.2 80.0 - 100.0 fL   MCH 31.2 26.0 - 34.0 pg   MCHC 33.1 30.0 - 36.0 g/dL   RDW 11.9 11.5 - 15.5 %   Platelets 198 150 - 400 K/uL   nRBC 0.0 0.0 - 0.2 %  D-dimer, quantitative (not at [facility])  Result Value Ref Range   D-Dimer, Quant 0.86 (H) 0.00 - 0.50 ug/mL-FEU  Troponin I (High Sensitivity)  Result Value Ref Range   Troponin I (High Sensitivity) <2 <18 ng/L  Troponin I (High Sensitivity)  Result Value Ref Range   Troponin I (High Sensitivity) <2.0 <18 ng/L   DG Chest 2 View  Result Date: 09/24/2019 CLINICAL DATA:  Chest pain EXAM: CHEST - 2 VIEW COMPARISON:  09/28/2018 FINDINGS: Heart and mediastinal contours are within normal limits. No focal opacities or effusions. No acute bony abnormality. IMPRESSION: No active cardiopulmonary disease. Electronically Signed   By: Rolm Baptise M.D.   On: 09/24/2019 21:45   NM Pulmonary Perfusion  Result Date: 09/25/2019 CLINICAL DATA:  Chest pain and elevated D-dimer EXAM: NUCLEAR MEDICINE PERFUSION LUNG SCAN TECHNIQUE: Perfusion images were obtained in multiple projections after intravenous injection of radiopharmaceutical. Ventilation scans intentionally deferred if perfusion scan and chest x-ray adequate for interpretation during COVID 19 epidemic. Views: Anterior, posterior, left lateral, right lateral, RPO, LPO, RAO, LAO RADIOPHARMACEUTICALS:  1.5 mCi Tc-40mMAA IV COMPARISON:  Chest radiograph September 23, 2009. FINDINGS: Radiotracer uptake is homogeneous and symmetric bilaterally. No perfusion defects are evident. IMPRESSION: No evident perfusion defects. Very low probability of pulmonary embolus. Electronically Signed   By: WLowella GripIII M.D.   On: 09/25/2019 12:31    MDM   74year old female with concern for pulmonary embolism chest pain that seems to be reproducible on my exam with more neck and trapezius pain.  Mildly  elevated dimer normal troponins EKG within normal limits.  No electrolyte anomalies patient does have some elevated creatinine consistent with CKD.  VQ scan ordered by previous provider to rule out PE.  She has been sleeping in ED during the vast majority of her stay.  She is well-appearing at this time and accepting of discharge.  She will be discharged with tizanidine and Tylenol.  Recommended not using ibuprofen or NSAIDs due to her CKD.  She is understanding of this.   The medical records were personally reviewed by myself. I personally reviewed all lab results and interpreted all imaging studies and either concurred with their official read or contacted radiology for clarification.   This patient appears reasonably screened and I doubt any other medical condition requiring further workup, evaluation, or  treatment in the ED at this time prior to discharge.   Patient's vitals are WNL apart from vital sign abnormalities discussed above, patient is in NAD, and able to ambulate in the ED at their baseline and able to tolerate PO.  Pain has been managed or a plan has been made for home management and has no complaints prior to discharge. Patient is comfortable with above plan and for discharge at this time. All questions were answered prior to disposition. Results from the ER workup discussed with the patient face to face and all questions answered to the best of my ability. The patient is safe for discharge with strict return precautions. Patient appears safe for discharge with appropriate follow-up. Conveyed my impression with the patient and they voiced understanding and are agreeable to plan.   An After Visit Summary was printed and given to the patient.  Portions of this note were generated with [employer]. Dictation errors may occur despite best attempts at proofreading.   Valerie Jackson was evaluated in Emergency Department on 09/25/2019 for the symptoms described in the history  of present illness. She was evaluated in the context of the global COVID-19 pandemic, which necessitated consideration that the patient might be at risk for infection with the SARS-CoV-2 virus that causes COVID-19. Institutional protocols and algorithms that pertain to the evaluation of patients at risk for COVID-19 are in a state of rapid change based on information released by regulatory bodies including the CDC and federal and state organizations. These policies and algorithms were followed during the patient's care in the ED.    Pati [place], [place] 09/25/19 1325    Quintella Reichert, MD 09/26/19 [phone]

## 2019-09-25 NOTE — ED Notes (Signed)
Patient transported to CT 

## 2019-09-28 ENCOUNTER — Encounter: Payer: Self-pay | Admitting: Internal Medicine

## 2019-10-11 DIAGNOSIS — I129 Hypertensive chronic kidney disease with stage 1 through stage 4 chronic kidney disease, or unspecified chronic kidney disease: Secondary | ICD-10-CM | POA: Diagnosis not present

## 2019-10-11 DIAGNOSIS — N183 Chronic kidney disease, stage 3 unspecified: Secondary | ICD-10-CM | POA: Diagnosis not present

## 2019-10-11 DIAGNOSIS — D631 Anemia in chronic kidney disease: Secondary | ICD-10-CM | POA: Diagnosis not present

## 2019-10-11 DIAGNOSIS — N2581 Secondary hyperparathyroidism of renal origin: Secondary | ICD-10-CM | POA: Diagnosis not present

## 2019-10-31 ENCOUNTER — Telehealth: Payer: Self-pay | Admitting: Emergency Medicine

## 2019-10-31 ENCOUNTER — Other Ambulatory Visit: Payer: Self-pay | Admitting: Internal Medicine

## 2019-10-31 DIAGNOSIS — I10 Essential (primary) hypertension: Secondary | ICD-10-CM

## 2019-10-31 DIAGNOSIS — E039 Hypothyroidism, unspecified: Secondary | ICD-10-CM

## 2019-10-31 MED ORDER — LEVOTHYROXINE SODIUM 150 MCG PO TABS: 150.0000 ug | ORAL_TABLET | Freq: Every day | ORAL | 1 refills | Status: DC

## 2019-10-31 NOTE — Telephone Encounter (Signed)
Pt called and is requesting a refill on her levothyroxine (SYNTHROID) 150 MCG tablet and colestipol (COLESTID) 1 g tablet. Pharmacy is [facility] [address] Thanks.

## 2019-10-31 NOTE — Telephone Encounter (Signed)
erx sent as requested.  

## 2019-11-01 ENCOUNTER — Telehealth: Payer: Self-pay

## 2019-11-01 NOTE — Telephone Encounter (Signed)
Patient would like to know if thyroid levels need to be drawn before her next visit? Please advise.

## 2019-11-02 ENCOUNTER — Ambulatory Visit: Payer: Medicare Other | Admitting: Internal Medicine

## 2019-11-17 ENCOUNTER — Other Ambulatory Visit: Payer: Self-pay

## 2019-11-17 ENCOUNTER — Ambulatory Visit (INDEPENDENT_AMBULATORY_CARE_PROVIDER_SITE_OTHER): Payer: Medicare Other | Admitting: Internal Medicine

## 2019-11-17 ENCOUNTER — Encounter: Payer: Self-pay | Admitting: Internal Medicine

## 2019-11-17 VITALS — BP 138/88 | HR 100 | Temp 98.4°F | Resp 16 | Ht 65.0 in | Wt 204.0 lb

## 2019-11-17 DIAGNOSIS — E039 Hypothyroidism, unspecified: Secondary | ICD-10-CM

## 2019-11-17 DIAGNOSIS — E876 Hypokalemia: Secondary | ICD-10-CM

## 2019-11-17 DIAGNOSIS — N182 Chronic kidney disease, stage 2 (mild): Secondary | ICD-10-CM

## 2019-11-17 DIAGNOSIS — Z1231 Encounter for screening mammogram for malignant neoplasm of breast: Secondary | ICD-10-CM

## 2019-11-17 DIAGNOSIS — T502X5A Adverse effect of carbonic-anhydrase inhibitors, benzothiadiazides and other diuretics, initial encounter: Secondary | ICD-10-CM

## 2019-11-17 DIAGNOSIS — I10 Essential (primary) hypertension: Secondary | ICD-10-CM | POA: Diagnosis not present

## 2019-11-17 DIAGNOSIS — E213 Hyperparathyroidism, unspecified: Secondary | ICD-10-CM | POA: Diagnosis not present

## 2019-11-17 LAB — BASIC METABOLIC PANEL
BUN: 10 mg/dL (ref 6–23)
CO2: 31 mEq/L (ref 19–32)
Calcium: 10.5 mg/dL (ref 8.4–10.5)
Chloride: 101 mEq/L (ref 96–112)
Creatinine, Ser: 1.44 mg/dL — ABNORMAL HIGH (ref 0.40–1.20)
GFR: 42.97 mL/min — ABNORMAL LOW (ref >60.00)
Glucose, Bld: 98 mg/dL (ref 70–99)
Potassium: 3.1 mEq/L — ABNORMAL LOW (ref 3.5–5.1)
Sodium: 140 mEq/L (ref 135–145)

## 2019-11-17 LAB — TSH: TSH: 0.01 u[IU]/mL — ABNORMAL LOW (ref 0.35–4.50)

## 2019-11-17 MED ORDER — LEVOTHYROXINE SODIUM 125 MCG PO TABS: 125.0000 ug | ORAL_TABLET | Freq: Every day | ORAL | 0 refills | Status: DC

## 2019-11-17 MED ORDER — POTASSIUM CHLORIDE CRYS ER 20 MEQ PO TBCR: 20.0000 meq | EXTENDED_RELEASE_TABLET | Freq: Two times a day (BID) | ORAL | 0 refills | Status: DC

## 2019-11-17 NOTE — Patient Instructions (Signed)
Hypothyroidism  Hypothyroidism is when the thyroid gland does not make enough of certain hormones (it is underactive). The thyroid gland is a small gland located in the lower front part of the neck, just in front of the windpipe (trachea). This gland makes hormones that help control how the body uses food for energy (metabolism) as well as how the heart and brain function. These hormones also play a role in keeping your bones strong. When the thyroid is underactive, it produces too little of the hormones thyroxine (T4) and triiodothyronine (T3). What are the causes? This condition may be caused by:  Hashimoto's disease. This is a disease in which the body's disease-fighting system (immune system) attacks the thyroid gland. This is the most common cause.  Viral infections.  Pregnancy.  Certain medicines.  Birth defects.  Past radiation treatments to the head or neck for cancer.  Past treatment with radioactive iodine.  Past exposure to radiation in the environment.  Past surgical removal of part or all of the thyroid.  Problems with a gland in the center of the brain (pituitary gland).  Lack of enough iodine in the diet. What increases the risk? You are more likely to develop this condition if:  You are female.  You have a family history of thyroid conditions.  You use a medicine called lithium.  You take medicines that affect the immune system (immunosuppressants). What are the signs or symptoms? Symptoms of this condition include:  Feeling as though you have no energy (lethargy).  Not being able to tolerate cold.  Weight gain that is not explained by a change in diet or exercise habits.  Lack of appetite.  Dry skin.  Coarse hair.  Menstrual irregularity.  Slowing of thought processes.  Constipation.  Sadness or depression. How is this diagnosed? This condition may be diagnosed based on:  Your symptoms, your medical history, and a physical exam.  Blood  tests. You may also have imaging tests, such as an ultrasound or MRI. How is this treated? This condition is treated with medicine that replaces the thyroid hormones that your body does not make. After you begin treatment, it may take several weeks for symptoms to go away. Follow these instructions at home:  Take over-the-counter and prescription medicines only as told by your health care provider.  If you start taking any new medicines, tell your health care provider.  Keep all follow-up visits as told by your health care provider. This is important. ? As your condition improves, your dosage of thyroid hormone medicine may change. ? You will need to have blood tests regularly so that your health care provider can monitor your condition. Contact a health care provider if:  Your symptoms do not get better with treatment.  You are taking thyroid replacement medicine and you: ? Sweat a lot. ? Have tremors. ? Feel anxious. ? Lose weight rapidly. ? Cannot tolerate heat. ? Have emotional swings. ? Have diarrhea. ? Feel weak. Get help right away if you have:  Chest pain.  An irregular heartbeat.  A rapid heartbeat.  Difficulty breathing. Summary  Hypothyroidism is when the thyroid gland does not make enough of certain hormones (it is underactive).  When the thyroid is underactive, it produces too little of the hormones thyroxine (T4) and triiodothyronine (T3).  The most common cause is Hashimoto's disease, a disease in which the body's disease-fighting system (immune system) attacks the thyroid gland. The condition can also be caused by viral infections, medicine, pregnancy, or past  radiation treatment to the head or neck.  Symptoms may include weight gain, dry skin, constipation, feeling as though you do not have energy, and not being able to tolerate cold.  This condition is treated with medicine to replace the thyroid hormones that your body does not make. This information  is not intended to replace advice given to you by your health care provider. Make sure you discuss any questions you have with your health care provider. Document Revised: 08/13/2017 Document Reviewed: 08/11/2017 Elsevier Patient Education  2020 [place].  COVID-19 Vaccine Information can be found at: ShippingScam.co.uk For questions related to vaccine distribution or appointments, please email [email or web address] or call [phone].

## 2019-11-17 NOTE — Progress Notes (Signed)
Subjective:  Patient ID: Valerie Jackson, female    DOB: Apr 29, 1946  Age: 74 y.o. MRN: [id number]  CC: Hypothyroidism and Hypertension  This visit occurred during the SARS-CoV-2 public health emergency.  Safety protocols were in place, including screening questions prior to the visit, additional usage of staff PPE, and extensive cleaning of exam room while observing appropriate contact time as indicated for disinfecting solutions.    HPI Valerie Jackson presents for f/up - She feels well today and offers no complaints.  She tells me that her blood pressure has been well controlled.  She is active and denies any recent episodes of chest pain, shortness of breath, palpitations, edema, or fatigue.  Outpatient Medications Prior to Visit  Medication Sig Dispense Refill  . acetaminophen (TYLENOL) 500 MG tablet Take 1 tablet (500 mg total) by mouth every 6 (six) hours as needed. 30 tablet 0  . ALPRAZolam (XANAX) 0.5 MG tablet Take 1 tablet (0.5 mg total) by mouth at bedtime as needed for anxiety. 30 tablet 5  . Biotin 10000 MCG TABS Take 1 tablet by mouth daily.    . Calcium Carbonate-Vit D-Min (CALCIUM 1200 PO) Take 1 tablet by mouth daily.    . cholecalciferol (VITAMIN D) 1000 UNITS tablet Take 2,000 Units by mouth daily.     . colestipol (COLESTID) 1 g tablet Take 2 tablets (2 g total) by mouth 2 (two) times daily. (Patient taking differently: Take 1 g by mouth 2 (two) times daily. ) 120 tablet 3  . cyanocobalamin (,VITAMIN B-12,) 1000 MCG/ML injection Inject 1 mL (1,000 mcg total) into the muscle every 30 (thirty) days. 4 mL 0  . Fluticasone Furoate (ARNUITY ELLIPTA) 100 MCG/ACT AEPB Inhale 1 puff into the lungs daily. 90 each 3  . indapamide (LOZOL) 1.25 MG tablet TAKE 1 TABLET (1.25 MG TOTAL) BY MOUTH DAILY. 90 tablet 1  . Magnesium 100 MG CAPS Take 1 capsule by mouth daily.    . methocarbamol (ROBAXIN) 500 MG tablet Take 1 tablet (500 mg total) by mouth every 8 (eight) hours as needed for  muscle spasms. 60 tablet 3  . rizatriptan (MAXALT-MLT) 10 MG disintegrating tablet Take 1 tablet (10 mg total) by mouth as needed for migraine. May repeat in 2 hours if needed 30 tablet 1  . SYRINGE-NEEDLE, DISP, 3 ML 25G X 5/8" 3 ML MISC 1 each by Does not apply route every 30 (thirty) days. 50 each 0  . tiZANidine (ZANAFLEX) 4 MG tablet Take 1 tablet (4 mg total) by mouth every 6 (six) hours as needed for muscle spasms. 30 tablet 0  . Zinc 100 MG TABS Take 1 tablet by mouth daily.    [name] levothyroxine (SYNTHROID) 150 MCG tablet Take 1 tablet (150 mcg total) by mouth daily before breakfast. 90 tablet 1   No facility-administered medications prior to visit.    ROS Review of Systems  Constitutional: Negative for diaphoresis, fatigue and unexpected weight change.  HENT: Negative.   Eyes: Negative.   Respiratory: Negative for cough, chest tightness, shortness of breath and wheezing.   Cardiovascular: Negative for chest pain, palpitations and leg swelling.  Gastrointestinal: Negative for abdominal pain, constipation, diarrhea, nausea and vomiting.  Endocrine: Negative for cold intolerance and heat intolerance.  Genitourinary: Negative.  Negative for difficulty urinating.  Musculoskeletal: Negative for arthralgias and myalgias.  Skin: Negative.  Negative for color change and pallor.  Neurological: Negative.  Negative for dizziness, weakness, light-headedness and headaches.  Hematological: Negative for adenopathy. Does  not bruise/bleed easily.  Psychiatric/Behavioral: Negative.     Objective:  BP 138/88 (BP Location: Left Arm, Patient Position: Sitting, Cuff Size: Large)   Pulse 100   Temp 98.4 F (36.9 C) (Oral)   Resp 16   Ht 5' 5"  (1.651 m)   Wt 204 lb (92.5 kg)   SpO2 98%   BMI 33.95 kg/m   BP Readings from Last 3 Encounters:  11/17/19 138/88  09/25/19 112/88  08/16/19 112/88    Wt Readings from Last 3 Encounters:  11/17/19 204 lb (92.5 kg)  09/24/19 205 lb (93 kg)    08/02/19 211 lb (95.7 kg)    Physical Exam Vitals reviewed.  Constitutional:      Appearance: Normal appearance.  HENT:     Nose: Nose normal.     Mouth/Throat:     Mouth: Mucous membranes are moist.  Eyes:     General: No scleral icterus.    Conjunctiva/sclera: Conjunctivae normal.  Cardiovascular:     Rate and Rhythm: Normal rate and regular rhythm.     Heart sounds: No murmur.  Pulmonary:     Effort: Pulmonary effort is normal.     Breath sounds: No stridor. No wheezing, rhonchi or rales.  Abdominal:     General: Abdomen is flat. Bowel sounds are normal. There is no distension.     Palpations: Abdomen is soft. There is no hepatomegaly or splenomegaly.     Tenderness: There is no abdominal tenderness.  Musculoskeletal:        General: Normal range of motion.     Cervical back: Neck supple.     Right lower leg: No edema.     Left lower leg: No edema.  Lymphadenopathy:     Cervical: No cervical adenopathy.  Skin:    General: Skin is warm and dry.  Neurological:     General: No focal deficit present.     Mental Status: She is alert.     Lab Results  Component Value Date   WBC 9.8 09/24/2019   HGB 12.9 09/24/2019   HCT 39.0 09/24/2019   PLT 198 09/24/2019   GLUCOSE 98 11/17/2019   CHOL 210 (H) 01/16/2019   TRIG 160.0 (H) 01/16/2019   HDL 47.60 01/16/2019   LDLDIRECT 226.0 02/27/2013   LDLCALC 130 (H) 01/16/2019   ALT 23 01/16/2019   AST 17 01/16/2019   NA 140 11/17/2019   K 3.1 (L) 11/17/2019   CL 101 11/17/2019   CREATININE 1.44 (H) 11/17/2019   BUN 10 11/17/2019   CO2 31 11/17/2019   TSH 0.01 (L) 11/17/2019   INR 1.02 06/24/2017   HGBA1C 4.7 05/14/2014    DG Chest 2 View  Result Date: 09/24/2019 CLINICAL DATA:  Chest pain EXAM: CHEST - 2 VIEW COMPARISON:  09/28/2018 FINDINGS: Heart and mediastinal contours are within normal limits. No focal opacities or effusions. No acute bony abnormality. IMPRESSION: No active cardiopulmonary disease.  Electronically Signed   By: Rolm Baptise M.D.   On: 09/24/2019 21:45   NM Pulmonary Perfusion  Result Date: 09/25/2019 CLINICAL DATA:  Chest pain and elevated D-dimer EXAM: NUCLEAR MEDICINE PERFUSION LUNG SCAN TECHNIQUE: Perfusion images were obtained in multiple projections after intravenous injection of radiopharmaceutical. Ventilation scans intentionally deferred if perfusion scan and chest x-ray adequate for interpretation during COVID 19 epidemic. Views: Anterior, posterior, left lateral, right lateral, RPO, LPO, RAO, LAO RADIOPHARMACEUTICALS:  1.5 mCi Tc-45mMAA IV COMPARISON:  Chest radiograph September 23, 2009. FINDINGS: Radiotracer uptake is homogeneous  and symmetric bilaterally. No perfusion defects are evident. IMPRESSION: No evident perfusion defects. Very low probability of pulmonary embolus. Electronically Signed   By: Lowella Grip III M.D.   On: 09/25/2019 12:31    Assessment & Plan:   [name] was seen today for hypothyroidism and hypertension.  Diagnoses and all orders for this visit:  Acquired hypothyroidism- Her TSH is suppressed down to 0.01.  I recommended that she decrease her dose of levothyroxine. -     TSH -     levothyroxine (SYNTHROID) 125 MCG tablet; Take 1 tablet (125 mcg total) by mouth daily before breakfast.  Hyperparathyroidism ([place])- Her calcium level is in the upper limits of the normal range at 10.5.  She is asymptomatic with this.  She will see endocrinology soon about this. -     Basic metabolic panel  CKD (chronic kidney disease) stage 2, GFR 60-89 ml/min- Her renal function is stable.  Her blood pressure is adequately well controlled.  She will avoid nephrotoxic agents.  Visit for screening mammogram -     MM DIGITAL SCREENING BILATERAL; Future  Diuretic-induced hypokalemia- She has developed hypokalemia as a result of the thiazide diuretic.  I recommended that she start taking a potassium supplement. -     potassium chloride SA (KLOR-CON) 20 MEQ  tablet; Take 1 tablet (20 mEq total) by mouth 2 (two) times daily.  Essential hypertension- Her blood pressure is adequately well controlled.  I will treat the hypokalemia.   I have discontinued [place] levothyroxine. I am also having her start on levothyroxine and potassium chloride SA. Additionally, I am having her maintain her cholecalciferol, rizatriptan, methocarbamol, Calcium Carbonate-Vit D-Min (CALCIUM 1200 PO), Magnesium, Arnuity Ellipta, Zinc, Biotin, ALPRAZolam, colestipol, SYRINGE-NEEDLE (DISP) 3 ML, cyanocobalamin, tiZANidine, acetaminophen, and indapamide.  Meds ordered this encounter  Medications  . levothyroxine (SYNTHROID) 125 MCG tablet    Sig: Take 1 tablet (125 mcg total) by mouth daily before breakfast.    Dispense:  90 tablet    Refill:  0  . potassium chloride SA (KLOR-CON) 20 MEQ tablet    Sig: Take 1 tablet (20 mEq total) by mouth 2 (two) times daily.    Dispense:  180 tablet    Refill:  0     Follow-up: Return in about 6 months (around 05/19/2020).  [name], MD

## 2019-11-21 ENCOUNTER — Encounter: Payer: Self-pay | Admitting: Endocrinology

## 2019-11-21 ENCOUNTER — Other Ambulatory Visit: Payer: Self-pay

## 2019-11-21 ENCOUNTER — Ambulatory Visit (INDEPENDENT_AMBULATORY_CARE_PROVIDER_SITE_OTHER): Payer: Medicare Other | Admitting: Endocrinology

## 2019-11-21 VITALS — BP 126/82 | HR 97 | Ht 65.0 in | Wt 202.6 lb

## 2019-11-21 DIAGNOSIS — M8580 Other specified disorders of bone density and structure, unspecified site: Secondary | ICD-10-CM

## 2019-11-21 DIAGNOSIS — Z13820 Encounter for screening for osteoporosis: Secondary | ICD-10-CM | POA: Diagnosis not present

## 2019-11-21 DIAGNOSIS — E213 Hyperparathyroidism, unspecified: Secondary | ICD-10-CM

## 2019-11-21 DIAGNOSIS — Z780 Asymptomatic menopausal state: Secondary | ICD-10-CM | POA: Insufficient documentation

## 2019-11-21 DIAGNOSIS — L309 Dermatitis, unspecified: Secondary | ICD-10-CM | POA: Diagnosis not present

## 2019-11-21 LAB — VITAMIN D 25 HYDROXY (VIT D DEFICIENCY, FRACTURES): VITD: 56.93 ng/mL (ref 30.00–100.00)

## 2019-11-21 NOTE — Patient Instructions (Signed)
Blood tests are requested for you today.  We'll let you know about the results.  For your safety, please reduce the levothyroxine, as Dr Ronnald Ramp advised.  Let's recheck the bone density Please come back for a follow-up appointment in 1 year.

## 2019-11-21 NOTE — Progress Notes (Signed)
Subjective:    Patient ID: Valerie Jackson, female    DOB: 08-30-1946, 74 y.o.   MRN: [id number]  HPI Pt returns for f/u of hypercalcemia (dx'ed early 2017 (it was normal in 2016); chlorthalidone was continued, as hypercalcemia was mild; no other cause was found; it normalized with vit-D supplementation).   She also has mild hyperparathyroidism (renal insufficiency could contribute; she did not meet criteria for surgery; she has never had urolithiasis; level returned to normal with vit-D supplementation).   Pt returns for f/u of osteoporosis: Dx'ed: 2016 Secondary cause: mild elev PTH, but this is resolved.   Fractures: left 5th toe (1970), with an injury.  Past rx: none Current rx: none Last DEXA result: 1/19 (worst T-score was -0.7) Other: pt states she feels well in general. Interval hx: she denies edema  For HTN: She takes aldactone now.   For Vit-D deficiency: she takes OTC supplement, at an uncertain dosage.   She is reluctant to reduce synthroid, due to weight gain.  Past Medical History:  Diagnosis Date  . Arthritis    fingers  . Blood clot in vein 1968  . Cataracts, bilateral   . CHF (congestive heart failure) ([place])   . Chronic kidney disease    CKD stage 2  . COPD (chronic obstructive pulmonary disease) ([place])   . Depression   . Diverticulitis 09/2013   with perforation  . Dizziness    with decreased BP readings or bending over  . Floaters   . GERD (gastroesophageal reflux disease)   . H/O hiatal hernia   . Headache(784.0)    migraine every 5- to 6 years  . Heart murmur since age 19   mild  . Hepatitis age 86 or 13   had heapatitis, not sure which kind  . History of blood transfusion 1965  . History of frequent urinary tract infections   . Hypercholesteremia   . Hypertension   . Hypothyroid   . Obesity   . Pericolonic abscess 2015  . Pneumonia   . Sickle cell trait [facility])     Past Surgical History:  Procedure Laterality Date  . CHOLECYSTECTOMY  2002  .  COLON RESECTION N/A 10/11/2013   Procedure: EXPLORATORY LAPAROTOMY WITH RECTOSIGMOID COLECTOMY AND END COLOSTOMY;  Surgeon: Stark Klein, MD;  Location: [facility];  Service: General;  Laterality: N/A;  . COLOSTOMY TAKEDOWN N/A 02/27/2014   Procedure: COLOSTOMY TAKEDOWN [name] HERNIA REPAIR;  Surgeon: Stark Klein, MD;  Location: [facility];  Service: General;  Laterality: N/A;  . INCISIONAL HERNIA REPAIR N/A 03/04/2016   Procedure: LAPAROSCOPIC INCISIONAL HERNIA WITH MESH;  Surgeon: Ralene Ok, MD;  Location: [facility];  Service: General;  Laterality: N/A;  . LAPAROSCOPIC LYSIS OF ADHESIONS N/A 03/04/2016   Procedure: LAPAROSCOPIC LYSIS OF ADHESIONS;  Surgeon: Ralene Ok, MD;  Location: [facility];  Service: General;  Laterality: N/A;  . RIGHT HEART CATH N/A 06/24/2017   Procedure: RIGHT HEART CATH;  Surgeon: Jolaine Artist, MD;  Location: [place] [facility];  Service: Cardiovascular;  Laterality: N/A;  . TONSILLECTOMY  1952    Social History   Socioeconomic History  . Marital status: Divorced    Spouse name: Not on file  . Number of children: 1  . Years of education: 70  . Highest education level: Not on file  Occupational History  . Occupation: Retired    Comment: Therapist, sports  Tobacco Use  . Smoking status: Former Smoker    Packs/day: 0.50    Years:  52.00    Pack years: 26.00    Types: Cigarettes    Quit date: 09/06/2012    Years since quitting: 7.2  . Smokeless tobacco: Never Used  . Tobacco comment: never smoked a full PPD.   Substance and Sexual Activity  . Alcohol use: No    Alcohol/week: 0.0 standard drinks    Comment: very rarely  . Drug use: No  . Sexual activity: Not Currently  Other Topics Concern  . Not on file  Social History Narrative   Regular exercise-no   Caffeine Use-yes   Social Determinants of Health   Financial Resource Strain:   . Difficulty of Paying Living Expenses:   Food Insecurity:   . Worried About Charity fundraiser in the Last Year:   . Arts development officer in the Last Year:   Transportation Needs:   . Film/video editor (Medical):   [name] Lack of Transportation (Non-Medical):   Physical Activity:   . Days of Exercise per Week:   . Minutes of Exercise per Session:   Stress:   . Feeling of Stress :   Social Connections:   . Frequency of Communication with Friends and Family:   . Frequency of Social Gatherings with Friends and Family:   . Attends Religious Services:   . Active Member of Clubs or Organizations:   . Attends Archivist Meetings:   [name] Marital Status:   Intimate Partner Violence:   . Fear of Current or Ex-Partner:   . Emotionally Abused:   [name] Physically Abused:   . Sexually Abused:     Current Outpatient Medications on File Prior to Visit  Medication Sig Dispense Refill  . acetaminophen (TYLENOL) 500 MG tablet Take 1 tablet (500 mg total) by mouth every 6 (six) hours as needed. 30 tablet 0  . ALPRAZolam (XANAX) 0.5 MG tablet Take 1 tablet (0.5 mg total) by mouth at bedtime as needed for anxiety. 30 tablet 5  . Biotin 10000 MCG TABS Take 1 tablet by mouth daily.    . Calcium Carbonate-Vit D-Min (CALCIUM 1200 PO) Take 1 tablet by mouth daily.    . cholecalciferol (VITAMIN D) 1000 UNITS tablet Take 2,000 Units by mouth daily.     . colestipol (COLESTID) 1 g tablet Take 2 tablets (2 g total) by mouth 2 (two) times daily. (Patient taking differently: Take 1 g by mouth 2 (two) times daily. ) 120 tablet 3  . cyanocobalamin (,VITAMIN B-12,) 1000 MCG/ML injection Inject 1 mL (1,000 mcg total) into the muscle every 30 (thirty) days. 4 mL 0  . Fluticasone Furoate (ARNUITY ELLIPTA) 100 MCG/ACT AEPB Inhale 1 puff into the lungs daily. 90 each 3  . indapamide (LOZOL) 1.25 MG tablet TAKE 1 TABLET (1.25 MG TOTAL) BY MOUTH DAILY. 90 tablet 1  . levothyroxine (SYNTHROID) 125 MCG tablet Take 1 tablet (125 mcg total) by mouth daily before breakfast. 90 tablet 0  . Magnesium 100 MG CAPS Take 1 capsule by mouth daily.    .  methocarbamol (ROBAXIN) 500 MG tablet Take 1 tablet (500 mg total) by mouth every 8 (eight) hours as needed for muscle spasms. 60 tablet 3  . potassium chloride SA (KLOR-CON) 20 MEQ tablet Take 1 tablet (20 mEq total) by mouth 2 (two) times daily. 180 tablet 0  . rizatriptan (MAXALT-MLT) 10 MG disintegrating tablet Take 1 tablet (10 mg total) by mouth as needed for migraine. May repeat in 2 hours if needed 30 tablet 1  .  SYRINGE-NEEDLE, DISP, 3 ML 25G X 5/8" 3 ML MISC 1 each by Does not apply route every 30 (thirty) days. 50 each 0  . tiZANidine (ZANAFLEX) 4 MG tablet Take 1 tablet (4 mg total) by mouth every 6 (six) hours as needed for muscle spasms. 30 tablet 0  . Zinc 100 MG TABS Take 1 tablet by mouth daily.    . [DISCONTINUED] indapamide (LOZOL) 1.25 MG tablet Take 1 tablet (1.25 mg total) by mouth daily. 90 tablet 0   No current facility-administered medications on file prior to visit.    Allergies  Allergen Reactions  . Crestor [Rosuvastatin] Other (See Comments)    Myalgias   . Estrogens     Blood clot in leg    Family History  Problem Relation Age of Onset  . Hypertension Mother   . Aneurysm Mother        Cerebral (died from this)  . Cancer Mother        Uterine  . Cancer Other   . Arthritis Sister   . Hyperlipidemia Sister   . Hypertension Sister   . Stroke Sister   . Kidney disease Sister        Cancer  . Hyperlipidemia Brother   . Glaucoma Brother   . Thyroid cancer Daughter   . Breast cancer Maternal Grandmother 78  . Hypertension Maternal Grandmother   . Hypercalcemia Neg Hx     BP 126/82 (BP Location: Left Arm, Patient Position: Sitting, Cuff Size: Large)   Pulse 97   Ht 5' 5"  (1.651 m)   Wt 202 lb 9.6 oz (91.9 kg)   SpO2 98%   BMI 33.71 kg/m    Review of Systems Denies falls    Objective:   Physical Exam VITAL SIGNS:  See vs page GENERAL: no distress GAIT: normal and steady.        Assessment & Plan:  Hypothyroidism:  overreplaced Osteoporosis: due for recheck Hyperparathyroidism: recheck today  Patient Instructions  Blood tests are requested for you today.  We'll let you know about the results.  For your safety, please reduce the levothyroxine, as Dr Ronnald Ramp advised.  Let's recheck the bone density Please come back for a follow-up appointment in 1 year.

## 2019-11-22 LAB — PTH, INTACT AND CALCIUM
Calcium: 10.8 mg/dL — ABNORMAL HIGH (ref 8.6–10.4)
PTH: 39 pg/mL (ref 14–64)

## 2019-12-04 ENCOUNTER — Other Ambulatory Visit: Payer: Self-pay

## 2019-12-04 ENCOUNTER — Ambulatory Visit (INDEPENDENT_AMBULATORY_CARE_PROVIDER_SITE_OTHER)
Admission: RE | Admit: 2019-12-04 | Discharge: 2019-12-04 | Disposition: A | Discharge status: Home / Self Care | Payer: Medicare Other | Source: Ambulatory Visit | Attending: Endocrinology | Admitting: Endocrinology

## 2019-12-04 ENCOUNTER — Other Ambulatory Visit: Payer: Medicare Other

## 2019-12-04 DIAGNOSIS — Z780 Asymptomatic menopausal state: Secondary | ICD-10-CM | POA: Diagnosis not present

## 2019-12-07 ENCOUNTER — Other Ambulatory Visit: Payer: Self-pay | Admitting: Internal Medicine

## 2019-12-08 MED ORDER — CYANOCOBALAMIN 1000 MCG/ML IJ SOLN: 1000.0000 ug | INTRAMUSCULAR | 3 refills | Status: DC

## 2019-12-12 ENCOUNTER — Ambulatory Visit
Admission: RE | Admit: 2019-12-12 | Discharge: 2019-12-12 | Disposition: A | Discharge status: Home / Self Care | Payer: Medicare Other | Source: Ambulatory Visit | Attending: Internal Medicine | Admitting: Internal Medicine

## 2019-12-12 ENCOUNTER — Other Ambulatory Visit: Payer: Self-pay

## 2019-12-12 DIAGNOSIS — Z1231 Encounter for screening mammogram for malignant neoplasm of breast: Secondary | ICD-10-CM

## 2019-12-12 LAB — HM MAMMOGRAPHY

## 2019-12-13 ENCOUNTER — Other Ambulatory Visit: Payer: Self-pay | Admitting: Internal Medicine

## 2019-12-13 DIAGNOSIS — N6320 Unspecified lump in the left breast, unspecified quadrant: Secondary | ICD-10-CM

## 2020-01-02 ENCOUNTER — Other Ambulatory Visit: Payer: Self-pay | Admitting: Internal Medicine

## 2020-01-03 ENCOUNTER — Other Ambulatory Visit: Payer: Self-pay

## 2020-01-03 ENCOUNTER — Ambulatory Visit
Admission: RE | Admit: 2020-01-03 | Discharge: 2020-01-03 | Disposition: A | Discharge status: Home / Self Care | Payer: Medicare Other | Source: Ambulatory Visit | Attending: Internal Medicine | Admitting: Internal Medicine

## 2020-01-03 DIAGNOSIS — N6321 Unspecified lump in the left breast, upper outer quadrant: Secondary | ICD-10-CM | POA: Diagnosis not present

## 2020-01-03 DIAGNOSIS — N6320 Unspecified lump in the left breast, unspecified quadrant: Secondary | ICD-10-CM

## 2020-01-03 DIAGNOSIS — R928 Other abnormal and inconclusive findings on diagnostic imaging of breast: Secondary | ICD-10-CM | POA: Diagnosis not present

## 2020-01-03 LAB — HM MAMMOGRAPHY

## 2020-01-17 ENCOUNTER — Ambulatory Visit (INDEPENDENT_AMBULATORY_CARE_PROVIDER_SITE_OTHER): Payer: Medicare Other | Admitting: Internal Medicine

## 2020-01-17 ENCOUNTER — Encounter: Payer: Self-pay | Admitting: Internal Medicine

## 2020-01-17 ENCOUNTER — Other Ambulatory Visit: Payer: Self-pay

## 2020-01-17 ENCOUNTER — Ambulatory Visit (INDEPENDENT_AMBULATORY_CARE_PROVIDER_SITE_OTHER): Payer: Medicare Other

## 2020-01-17 VITALS — BP 114/78 | HR 78 | Temp 98.4°F | Ht 65.0 in | Wt 201.0 lb

## 2020-01-17 DIAGNOSIS — E039 Hypothyroidism, unspecified: Secondary | ICD-10-CM

## 2020-01-17 DIAGNOSIS — M79672 Pain in left foot: Secondary | ICD-10-CM | POA: Diagnosis not present

## 2020-01-17 DIAGNOSIS — M79675 Pain in left toe(s): Secondary | ICD-10-CM | POA: Insufficient documentation

## 2020-01-17 DIAGNOSIS — M10372 Gout due to renal impairment, left ankle and foot: Secondary | ICD-10-CM | POA: Diagnosis not present

## 2020-01-17 DIAGNOSIS — N182 Chronic kidney disease, stage 2 (mild): Secondary | ICD-10-CM | POA: Diagnosis not present

## 2020-01-17 DIAGNOSIS — I10 Essential (primary) hypertension: Secondary | ICD-10-CM | POA: Diagnosis not present

## 2020-01-17 DIAGNOSIS — M7989 Other specified soft tissue disorders: Secondary | ICD-10-CM

## 2020-01-17 LAB — CBC WITH DIFFERENTIAL/PLATELET
Basophils Absolute: 0.1 10*3/uL (ref 0.0–0.1)
Basophils Relative: 0.7 % (ref 0.0–3.0)
Eosinophils Absolute: 0.6 10*3/uL (ref 0.0–0.7)
Eosinophils Relative: 6.2 % — ABNORMAL HIGH (ref 0.0–5.0)
HCT: 38 % (ref 36.0–46.0)
Hemoglobin: 12.9 g/dL (ref 12.0–15.0)
Lymphocytes Relative: 27.5 % (ref 12.0–46.0)
Lymphs Abs: 2.5 10*3/uL (ref 0.7–4.0)
MCHC: 33.9 g/dL (ref 30.0–36.0)
MCV: 87.5 fl (ref 78.0–100.0)
Monocytes Absolute: 0.6 10*3/uL (ref 0.1–1.0)
Monocytes Relative: 7.1 % (ref 3.0–12.0)
Neutro Abs: 5.2 10*3/uL (ref 1.4–7.7)
Neutrophils Relative %: 58.5 % (ref 43.0–77.0)
Platelets: 216 10*3/uL (ref 150.0–400.0)
RBC: 4.34 Mil/uL (ref 3.87–5.11)
RDW: 14.5 % (ref 11.5–15.5)
WBC: 9 10*3/uL (ref 4.0–10.5)

## 2020-01-17 LAB — TSH: TSH: 0.03 u[IU]/mL — ABNORMAL LOW (ref 0.35–4.50)

## 2020-01-17 LAB — SEDIMENTATION RATE: Sed Rate: 25 mm/hr (ref 0–30)

## 2020-01-17 LAB — BASIC METABOLIC PANEL
BUN: 17 mg/dL (ref 6–23)
CO2: 32 mEq/L (ref 19–32)
Calcium: 10.4 mg/dL (ref 8.4–10.5)
Chloride: 103 mEq/L (ref 96–112)
Creatinine, Ser: 1.46 mg/dL — ABNORMAL HIGH (ref 0.40–1.20)
GFR: 42.27 mL/min — ABNORMAL LOW (ref >60.00)
Glucose, Bld: 90 mg/dL (ref 70–99)
Potassium: 4.5 mEq/L (ref 3.5–5.1)
Sodium: 142 mEq/L (ref 135–145)

## 2020-01-17 LAB — URIC ACID: Uric Acid, Serum: 9.7 mg/dL — ABNORMAL HIGH (ref 2.4–7.0)

## 2020-01-17 MED ORDER — LEVOTHYROXINE SODIUM 100 MCG PO TABS: 100.0000 ug | ORAL_TABLET | Freq: Every day | ORAL | 0 refills | Status: DC

## 2020-01-17 MED ORDER — OXYCODONE-ACETAMINOPHEN 7.5-325 MG PO TABS: 1.0000 | ORAL_TABLET | ORAL | 0 refills | Status: DC | PRN

## 2020-01-17 MED ORDER — METHYLPREDNISOLONE 4 MG PO TBPK: ORAL_TABLET | ORAL | 0 refills | Status: AC

## 2020-01-17 MED ORDER — COLCHICINE 0.6 MG PO CAPS: 1.0000 | ORAL_CAPSULE | Freq: Every day | ORAL | 0 refills | Status: DC

## 2020-01-17 NOTE — Patient Instructions (Signed)

## 2020-01-17 NOTE — Progress Notes (Signed)
Subjective:  Patient ID: Valerie Jackson, female    DOB: Feb 04, 1946  Age: 74 y.o. MRN: [id number]  CC: Hypothyroidism and Foot Pain  This visit occurred during the SARS-CoV-2 public health emergency.  Safety protocols were in place, including screening questions prior to the visit, additional usage of staff PPE, and extensive cleaning of exam room while observing appropriate contact time as indicated for disinfecting solutions.    HPI [place] En[place] presents for concern about her left foot.  She has a 2-day history of nontraumatic pain and swelling on the dorsum of the second, third, and fourth MTP joints.   Outpatient Medications Prior to Visit  Medication Sig Dispense Refill  . acetaminophen (TYLENOL) 500 MG tablet Take 1 tablet (500 mg total) by mouth every 6 (six) hours as needed. 30 tablet 0  . ALPRAZolam (XANAX) 0.5 MG tablet Take 1 tablet (0.5 mg total) by mouth at bedtime as needed for anxiety. 30 tablet 5  . Calcium Carbonate-Vit D-Min (CALCIUM 1200 PO) Take 1 tablet by mouth daily.    . cholecalciferol (VITAMIN D) 1000 UNITS tablet Take 2,000 Units by mouth daily.     . colestipol (COLESTID) 1 g tablet Take 1 tablet (1 g total) by mouth 2 (two) times daily. 60 tablet 2  . cyanocobalamin (,VITAMIN B-12,) 1000 MCG/ML injection Inject 1 mL (1,000 mcg total) into the muscle every 30 (thirty) days. 4 mL 3  . Fluticasone Furoate (ARNUITY ELLIPTA) 100 MCG/ACT AEPB Inhale 1 puff into the lungs daily. 90 each 3  . methocarbamol (ROBAXIN) 500 MG tablet Take 1 tablet (500 mg total) by mouth every 8 (eight) hours as needed for muscle spasms. 60 tablet 3  . potassium chloride SA (KLOR-CON) 20 MEQ tablet Take 1 tablet (20 mEq total) by mouth 2 (two) times daily. 180 tablet 0  . rizatriptan (MAXALT-MLT) 10 MG disintegrating tablet Take 1 tablet (10 mg total) by mouth as needed for migraine. May repeat in 2 hours if needed 30 tablet 1  . SYRINGE-NEEDLE, DISP, 3 ML 25G X 5/8" 3 ML MISC 1 each by  Does not apply route every 30 (thirty) days. 50 each 0  . tiZANidine (ZANAFLEX) 4 MG tablet Take 1 tablet (4 mg total) by mouth every 6 (six) hours as needed for muscle spasms. 30 tablet 0  . indapamide (LOZOL) 1.25 MG tablet TAKE 1 TABLET (1.25 MG TOTAL) BY MOUTH DAILY. 90 tablet 1  . levothyroxine (SYNTHROID) 125 MCG tablet Take 1 tablet (125 mcg total) by mouth daily before breakfast. 90 tablet 0  . Biotin 10000 MCG TABS Take 1 tablet by mouth daily.    . Magnesium 100 MG CAPS Take 1 capsule by mouth daily.    . Zinc 100 MG TABS Take 1 tablet by mouth daily.     No facility-administered medications prior to visit.    ROS Review of Systems  Constitutional: Negative.  Negative for chills, fatigue and fever.  HENT: Negative.   Eyes: Negative.   Respiratory: Negative.  Negative for cough, chest tightness, shortness of breath and wheezing.   Cardiovascular: Negative for chest pain, palpitations and leg swelling.  Gastrointestinal: Negative for abdominal pain, constipation, diarrhea, nausea and vomiting.  Endocrine: Negative.  Negative for cold intolerance.  Genitourinary: Negative.   Musculoskeletal: Positive for arthralgias. Negative for back pain and myalgias.  Skin: Negative.   Neurological: Negative.  Negative for dizziness, weakness and light-headedness.  Hematological: Negative for adenopathy. Does not bruise/bleed easily.  Psychiatric/Behavioral: Negative.  Objective:  BP 114/78 (BP Location: Left Arm, Patient Position: Sitting, Cuff Size: Large)   Pulse 78   Temp 98.4 F (36.9 C) (Oral)   Ht 5' 5"  (1.651 m)   Wt 201 lb (91.2 kg)   SpO2 98%   BMI 33.45 kg/m   BP Readings from Last 3 Encounters:  01/17/20 114/78  11/21/19 126/82  11/17/19 138/88    Wt Readings from Last 3 Encounters:  01/17/20 201 lb (91.2 kg)  11/21/19 202 lb 9.6 oz (91.9 kg)  11/17/19 204 lb (92.5 kg)    Physical Exam Vitals reviewed.  Constitutional:      Appearance: Normal appearance.    HENT:     Nose: Nose normal.     Mouth/Throat:     Mouth: Mucous membranes are moist.  Eyes:     General: No scleral icterus.    Conjunctiva/sclera: Conjunctivae normal.  Cardiovascular:     Rate and Rhythm: Normal rate and regular rhythm.     Heart sounds: No murmur.  Pulmonary:     Effort: Pulmonary effort is normal.     Breath sounds: No stridor. No wheezing, rhonchi or rales.  Abdominal:     General: Abdomen is flat. Bowel sounds are normal. There is no distension.     Palpations: Abdomen is soft. There is no hepatomegaly, splenomegaly or mass.     Tenderness: There is no abdominal tenderness.  Musculoskeletal:     Cervical back: Neck supple.       Feet:  Lymphadenopathy:     Cervical: No cervical adenopathy.  Neurological:     Mental Status: She is alert.     Lab Results  Component Value Date   WBC 9.0 01/17/2020   HGB 12.9 01/17/2020   HCT 38.0 01/17/2020   PLT 216.0 01/17/2020   GLUCOSE 90 01/17/2020   CHOL 210 (H) 01/16/2019   TRIG 160.0 (H) 01/16/2019   HDL 47.60 01/16/2019   LDLDIRECT 226.0 02/27/2013   LDLCALC 130 (H) 01/16/2019   ALT 23 01/16/2019   AST 17 01/16/2019   NA 142 01/17/2020   K 4.5 01/17/2020   CL 103 01/17/2020   CREATININE 1.46 (H) 01/17/2020   BUN 17 01/17/2020   CO2 32 01/17/2020   TSH 0.03 (L) 01/17/2020   INR 1.02 06/24/2017   HGBA1C 4.7 05/14/2014    US BREAST LTD UNI LEFT INC AXILLA  Result Date: 01/03/2020 CLINICAL DATA:  74 year old female presenting as a recall from screening for possible left breast mass. EXAM: DIGITAL DIAGNOSTIC LEFT MAMMOGRAM WITH TOMO ULTRASOUND LEFT BREAST COMPARISON:  Previous exam(s). ACR Breast Density Category b: There are scattered areas of fibroglandular density. FINDINGS: Mammogram: Spot compression tomosynthesis views of the left breast demonstrate persistence of an oval circumscribed mass measuring approximately 0.5 cm in the outer left breast. There is an additional nearby oval circumscribed  mass posterior to this measuring 0.4 cm. Ultrasound: Targeted ultrasound is performed in the left breast at 2:30 o'clock 5 cm from the nipple demonstrating a cluster of microcysts measuring 0.6 x 0.4 x 0.5 cm. This corresponds to the mass seen mammographically. At 2:30 o'clock 6 cm from the nipple there is an oval circumscribed anechoic mass measuring 0.5 x 0.4 x 0.5 cm, consistent with a benign simple cyst. This likely corresponds to the other small mass seen mammographically. IMPRESSION: Left breast masses at 2:30 o'clock are consistent with benign cluster of cysts and simple cysts. No mammographic or sonographic evidence of malignancy. RECOMMENDATION: Screening mammogram in one  year.(Code:SM-B-01Y) I have discussed the findings and recommendations with the patient. If applicable, a reminder letter will be sent to the patient regarding the next appointment. BI-RADS CATEGORY  2: Benign. Electronically Signed   By: Audie Pinto M.D.   On: 01/03/2020 11:42   MM DIAG BREAST TOMO UNI LEFT  Result Date: 01/03/2020 CLINICAL DATA:  74 year old female presenting as a recall from screening for possible left breast mass. EXAM: DIGITAL DIAGNOSTIC LEFT MAMMOGRAM WITH TOMO ULTRASOUND LEFT BREAST COMPARISON:  Previous exam(s). ACR Breast Density Category b: There are scattered areas of fibroglandular density. FINDINGS: Mammogram: Spot compression tomosynthesis views of the left breast demonstrate persistence of an oval circumscribed mass measuring approximately 0.5 cm in the outer left breast. There is an additional nearby oval circumscribed mass posterior to this measuring 0.4 cm. Ultrasound: Targeted ultrasound is performed in the left breast at 2:30 o'clock 5 cm from the nipple demonstrating a cluster of microcysts measuring 0.6 x 0.4 x 0.5 cm. This corresponds to the mass seen mammographically. At 2:30 o'clock 6 cm from the nipple there is an oval circumscribed anechoic mass measuring 0.5 x 0.4 x 0.5 cm, consistent  with a benign simple cyst. This likely corresponds to the other small mass seen mammographically. IMPRESSION: Left breast masses at 2:30 o'clock are consistent with benign cluster of cysts and simple cysts. No mammographic or sonographic evidence of malignancy. RECOMMENDATION: Screening mammogram in one year.(Code:SM-B-01Y) I have discussed the findings and recommendations with the patient. If applicable, a reminder letter will be sent to the patient regarding the next appointment. BI-RADS CATEGORY  2: Benign. Electronically Signed   By: Audie Pinto M.D.   On: 01/03/2020 11:42    DG Foot Complete Left  Result Date: 01/17/2020 CLINICAL DATA:  Nontraumatic pain and swelling the MTP joints EXAM: LEFT FOOT - COMPLETE 3+ VIEW COMPARISON:  None. FINDINGS: No fracture or dislocation of mid foot or forefoot. The phalanges are normal. The calcaneus is normal. No soft tissue abnormality. IMPRESSION: No acute osseous abnormality. Electronically Signed   By: Suzy Bouchard M.D.   On: 01/17/2020 14:07    Assessment & Plan:   [name] was seen today for hypothyroidism and foot pain.  Diagnoses and all orders for this visit:  Acquired hypothyroidism- Her TSH is suppressed.  I recommended that she lower her dose of levothyroxine. -     TSH; Future -     TSH -     levothyroxine (SYNTHROID) 100 MCG tablet; Take 1 tablet (100 mcg total) by mouth daily.  CKD (chronic kidney disease) stage 2, GFR 60-89 ml/min- Her blood pressure is mildly overcontrolled.  Her renal function is stable.  I recommended that she stop taking indapamide. -     Basic metabolic panel; Future -     CBC with Differential/Platelet; Future -     Uric acid; Future -     Uric acid -     CBC with Differential/Platelet -     Basic metabolic panel  Pain and swelling of toe of left foot- Based on her symptoms, exam, x-ray, and elevated uric acid level I think this is likely an acute gouty arthropathy.  Will avoid NSAIDs due to renal  insufficiency.  I recommended that she start taking colchicine (renally dosed), a 6-day course of methylprednisolone, and Percocet for the pain. -     Sedimentation rate; Future -     Uric acid; Future -     DG Foot Complete Left; Future -  methylPREDNISolone (MEDROL DOSEPAK) 4 MG TBPK tablet; TAKE AS DIRECTED -     Uric acid -     Sedimentation rate  Essential hypertension- Her blood pressure is overcontrolled.  I recommended that she stop taking indapamide.  Acute gout due to renal impairment involving left foot -     oxyCODONE-acetaminophen (PERCOCET) 7.5-325 MG tablet; Take 1 tablet by mouth every 4 (four) hours as needed. -     Colchicine (MITIGARE) 0.6 MG CAPS; Take 1 tablet by mouth daily.   I have discontinued [name] R. Bunney's Magnesium, Zinc, Biotin, indapamide, and levothyroxine. I am also having her start on methylPREDNISolone, oxyCODONE-acetaminophen, Colchicine, and levothyroxine. Additionally, I am having her maintain her cholecalciferol, rizatriptan, methocarbamol, Calcium Carbonate-Vit D-Min (CALCIUM 1200 PO), Arnuity Ellipta, ALPRAZolam, SYRINGE-NEEDLE (DISP) 3 ML, tiZANidine, acetaminophen, potassium chloride SA, cyanocobalamin, and colestipol.  Meds ordered this encounter  Medications  . methylPREDNISolone (MEDROL DOSEPAK) 4 MG TBPK tablet    Sig: TAKE AS DIRECTED    Dispense:  21 tablet    Refill:  0  . oxyCODONE-acetaminophen (PERCOCET) 7.5-325 MG tablet    Sig: Take 1 tablet by mouth every 4 (four) hours as needed.    Dispense:  20 tablet    Refill:  0  . Colchicine (MITIGARE) 0.6 MG CAPS    Sig: Take 1 tablet by mouth daily.    Dispense:  90 capsule    Refill:  0  . levothyroxine (SYNTHROID) 100 MCG tablet    Sig: Take 1 tablet (100 mcg total) by mouth daily.    Dispense:  90 tablet    Refill:  0   I spent 50 minutes in preparing to see the patient by review of recent labs, imaging and procedures, obtaining and reviewing separately obtained history,  communicating with the patient and family or caregiver, ordering medications, tests or procedures, and documenting clinical information in the EHR including the differential Dx, treatment, and any further evaluation and other management of 1. Acquired hypothyroidism 2. CKD (chronic kidney disease) stage 2, GFR 60-89 ml/min 3. Pain and swelling of toe of left foot 4. Essential hypertension 5. Acute gout due to renal impairment involving left foot     Follow-up: Return in about 3 weeks (around 02/07/2020).  [name], MD

## 2020-01-23 ENCOUNTER — Telehealth: Payer: Self-pay | Admitting: Internal Medicine

## 2020-01-23 DIAGNOSIS — M10372 Gout due to renal impairment, left ankle and foot: Secondary | ICD-10-CM

## 2020-01-23 MED ORDER — COLCHICINE 0.6 MG PO CAPS: 1.0000 | ORAL_CAPSULE | Freq: Every day | ORAL | 0 refills | Status: DC

## 2020-01-23 NOTE — Telephone Encounter (Signed)
    Patient states [facility] cost is $400 for Colchicine (MITIGARE) 0.6 MG CAPS  Patient requesting new script be sent to [facility] on [address] for Colchicine TABLETS. The tablets are cheaper  Patient also reporting foot is still slightly swollen

## 2020-01-23 NOTE — Telephone Encounter (Signed)
erx has been sent as requested.  

## 2020-01-23 NOTE — Telephone Encounter (Signed)
LVM for pt informing that rx has been sent in as requested.

## 2020-01-24 MED ORDER — COLCHICINE 0.6 MG PO TABS: 0.6000 mg | ORAL_TABLET | Freq: Every day | ORAL | 5 refills | Status: DC

## 2020-01-24 NOTE — Telephone Encounter (Signed)
Per PCP - okay to change colchicine from caps to tabs.   erx has been sent and the med list has been updated.

## 2020-01-24 NOTE — Addendum Note (Signed)
Addended by: Aviva Signs M on: 01/24/2020 02:05 PM   Modules accepted: Orders

## 2020-01-24 NOTE — Telephone Encounter (Signed)
New Message:   Pt is calling and states that this prescription she needs it to be TABLETS not capsules. She states the tablets are cheaper. Please resend to pharmacy for tablets.

## 2020-01-26 NOTE — Telephone Encounter (Signed)
Pt contacted and informed that the rx was sent in for her. Pt stated that the price diff between the caps and tab was astronomical. Pt will call back if she as any other issues with picking up the medication.

## 2020-02-01 ENCOUNTER — Other Ambulatory Visit: Payer: Self-pay | Admitting: Internal Medicine

## 2020-02-01 DIAGNOSIS — I10 Essential (primary) hypertension: Secondary | ICD-10-CM

## 2020-02-07 DIAGNOSIS — I129 Hypertensive chronic kidney disease with stage 1 through stage 4 chronic kidney disease, or unspecified chronic kidney disease: Secondary | ICD-10-CM | POA: Diagnosis not present

## 2020-02-07 DIAGNOSIS — N2581 Secondary hyperparathyroidism of renal origin: Secondary | ICD-10-CM | POA: Diagnosis not present

## 2020-02-07 DIAGNOSIS — D631 Anemia in chronic kidney disease: Secondary | ICD-10-CM | POA: Diagnosis not present

## 2020-02-07 DIAGNOSIS — N189 Chronic kidney disease, unspecified: Secondary | ICD-10-CM | POA: Diagnosis not present

## 2020-02-07 DIAGNOSIS — N183 Chronic kidney disease, stage 3 unspecified: Secondary | ICD-10-CM | POA: Diagnosis not present

## 2020-02-16 ENCOUNTER — Other Ambulatory Visit: Payer: Self-pay | Admitting: Internal Medicine

## 2020-02-16 DIAGNOSIS — E876 Hypokalemia: Secondary | ICD-10-CM

## 2020-02-16 DIAGNOSIS — T502X5A Adverse effect of carbonic-anhydrase inhibitors, benzothiadiazides and other diuretics, initial encounter: Secondary | ICD-10-CM

## 2020-03-14 ENCOUNTER — Other Ambulatory Visit: Payer: Self-pay

## 2020-03-14 ENCOUNTER — Ambulatory Visit (INDEPENDENT_AMBULATORY_CARE_PROVIDER_SITE_OTHER): Payer: Medicare Other | Admitting: Internal Medicine

## 2020-03-14 ENCOUNTER — Encounter: Payer: Self-pay | Admitting: Internal Medicine

## 2020-03-14 VITALS — BP 136/88 | HR 74 | Temp 98.2°F | Ht 65.0 in | Wt 200.0 lb

## 2020-03-14 DIAGNOSIS — L720 Epidermal cyst: Secondary | ICD-10-CM

## 2020-03-14 DIAGNOSIS — R300 Dysuria: Secondary | ICD-10-CM

## 2020-03-14 LAB — POCT URINALYSIS DIPSTICK
Bilirubin, UA: NEGATIVE
Glucose, UA: NEGATIVE
Ketones, UA: NEGATIVE
Nitrite, UA: NEGATIVE
Protein, UA: POSITIVE — AB
Spec Grav, UA: 1.015 (ref 1.010–1.025)
Urobilinogen, UA: NEGATIVE E.U./dL — AB
pH, UA: 6 (ref 5.0–8.0)

## 2020-03-14 MED ORDER — NITROFURANTOIN MONOHYD MACRO 100 MG PO CAPS: 100.0000 mg | ORAL_CAPSULE | Freq: Two times a day (BID) | ORAL | 0 refills | Status: DC

## 2020-03-14 MED ORDER — PHENAZOPYRIDINE HCL 100 MG PO TABS: 100.0000 mg | ORAL_TABLET | Freq: Three times a day (TID) | ORAL | 0 refills | Status: DC | PRN

## 2020-03-14 NOTE — Progress Notes (Signed)
   Subjective:   Patient ID: Valerie Jackson, female    DOB: Jul 09, 1946, 74 y.o.   MRN: [id number]  HPI The patient is a 74 YO female coming in for concerns about possible UTI. Symptoms started 2 days ago and she is having burning at the end of urination and urge to go more often. Denies fevers or chills. Denies nausea or vomiting or abdominal/back pain. Overall it is not improving. Has tried increasing fluids without help. She is also having a cyst on her right neck which is oozing out drainage which smells off and on over the years. She would like to see dermatologist to get removed. Not painful to touch.   Review of Systems  Constitutional: Negative.   HENT: Negative.   Eyes: Negative.   Respiratory: Negative.  Negative for cough, chest tightness and shortness of breath.   Cardiovascular: Negative.  Negative for chest pain, palpitations and leg swelling.  Gastrointestinal: Negative for abdominal distention, abdominal pain, constipation, diarrhea, nausea and vomiting.  Genitourinary: Positive for dysuria, frequency and urgency.  Musculoskeletal: Negative.   Skin: Negative.        Cyst neck  Neurological: Negative.   Psychiatric/Behavioral: Negative.     Objective:  Physical Exam Constitutional:      Appearance: She is well-developed.  HENT:     Head: Normocephalic and atraumatic.  Cardiovascular:     Rate and Rhythm: Normal rate and regular rhythm.  Pulmonary:     Effort: Pulmonary effort is normal. No respiratory distress.     Breath sounds: Normal breath sounds. No wheezing or rales.  Abdominal:     General: Bowel sounds are normal. There is no distension.     Palpations: Abdomen is soft.     Tenderness: There is no abdominal tenderness. There is no rebound.  Musculoskeletal:     Cervical back: Normal range of motion.     Comments: Cyst right neck without purulent drainage  Skin:    General: Skin is warm and dry.  Neurological:     Mental Status: She is alert and  oriented to person, place, and time.     Coordination: Coordination normal.     Vitals:   03/14/20 1046  BP: 136/88  Pulse: 74  Temp: 98.2 F (36.8 C)  TempSrc: Oral  SpO2: 98%  Weight: 200 lb (90.7 kg)  Height: 5' 5"  (1.651 m)    This visit occurred during the SARS-CoV-2 public health emergency.  Safety protocols were in place, including screening questions prior to the visit, additional usage of staff PPE, and extensive cleaning of exam room while observing appropriate contact time as indicated for disinfecting solutions.   Assessment & Plan:

## 2020-03-14 NOTE — Assessment & Plan Note (Addendum)
U/A done in the office consistent with acute cystitis and rx for macrobid and pyridium.

## 2020-03-14 NOTE — Patient Instructions (Addendum)
You do have a urinary infection and we have sent in azo to take for the pain.  We have also sent in macrobid to take 1 pill twice day for 5 days

## 2020-03-14 NOTE — Assessment & Plan Note (Signed)
Refer to dermatology for removal.

## 2020-03-19 ENCOUNTER — Telehealth: Payer: Self-pay | Admitting: Internal Medicine

## 2020-03-19 ENCOUNTER — Other Ambulatory Visit: Payer: Self-pay | Admitting: Internal Medicine

## 2020-03-19 DIAGNOSIS — R300 Dysuria: Secondary | ICD-10-CM

## 2020-03-19 MED ORDER — "SYRINGE/NEEDLE (DISP) 25G X 5/8"" 3 ML MISC": 1.0000 | 0 refills | Status: DC

## 2020-03-19 MED ORDER — COLESTIPOL HCL 1 G PO TABS: 1.0000 g | ORAL_TABLET | Freq: Two times a day (BID) | ORAL | 3 refills | Status: DC

## 2020-03-19 MED ORDER — CYANOCOBALAMIN 1000 MCG/ML IJ SOLN: 1000.0000 ug | INTRAMUSCULAR | 3 refills | Status: DC

## 2020-03-19 NOTE — Telephone Encounter (Signed)
Pt saw Dr. Sharlet Salina on 03/14/2020. UTI symptoms are still persisting after 5 day treatment of Macrobid.   LVM for pt to call back - need specific symptoms if patient calls back.

## 2020-03-19 NOTE — Telephone Encounter (Signed)
Colestid refilled

## 2020-03-19 NOTE — Telephone Encounter (Signed)
Patient called for refill on Colestid to be sent to [facility]

## 2020-03-19 NOTE — Telephone Encounter (Signed)
   Refill cyanocobalamin (,VITAMIN B-12,) 1000 MCG/ML injection Pharmacy: [facility]

## 2020-03-19 NOTE — Telephone Encounter (Signed)
erx has been sent as requested.  

## 2020-03-19 NOTE — Telephone Encounter (Signed)
  Patient calling to report she is still having symptoms of UTI.  She has taken all medication prescribed by Dr Sharlet Salina

## 2020-03-20 NOTE — Telephone Encounter (Signed)
New message:   Pt is calling back and ask if you can text her cause she will respond sooner. She states he phone sends our number to spam calls and goes straight to voicemail. Please advise.

## 2020-03-21 ENCOUNTER — Other Ambulatory Visit: Payer: Self-pay | Admitting: Internal Medicine

## 2020-03-21 ENCOUNTER — Other Ambulatory Visit (INDEPENDENT_AMBULATORY_CARE_PROVIDER_SITE_OTHER): Payer: Medicare Other

## 2020-03-21 DIAGNOSIS — R300 Dysuria: Secondary | ICD-10-CM | POA: Diagnosis not present

## 2020-03-21 DIAGNOSIS — N3000 Acute cystitis without hematuria: Secondary | ICD-10-CM | POA: Insufficient documentation

## 2020-03-21 LAB — URINALYSIS, ROUTINE W REFLEX MICROSCOPIC
Bilirubin Urine: NEGATIVE
Ketones, ur: NEGATIVE
Nitrite: NEGATIVE
Specific Gravity, Urine: 1.02 (ref 1.000–1.030)
Total Protein, Urine: 100 — AB
Urine Glucose: NEGATIVE
Urobilinogen, UA: 0.2 (ref 0.0–1.0)
pH: 6 (ref 5.0–8.0)

## 2020-03-21 MED ORDER — SULFAMETHOXAZOLE-TRIMETHOPRIM 800-160 MG PO TABS: 1.0000 | ORAL_TABLET | Freq: Two times a day (BID) | ORAL | 0 refills | Status: AC

## 2020-03-21 NOTE — Telephone Encounter (Signed)
Pt states: Have burning at end of urination no other symptoms  Pt completed abx nitro 100 mg bid for 5 days. Dysuria did resolve for a short while, but then returned.

## 2020-03-23 LAB — CULTURE, URINE COMPREHENSIVE
MICRO NUMBER:: 10680871
SPECIMEN QUALITY:: ADEQUATE

## 2020-04-03 ENCOUNTER — Other Ambulatory Visit: Payer: Self-pay | Admitting: Internal Medicine

## 2020-04-13 ENCOUNTER — Other Ambulatory Visit: Payer: Self-pay | Admitting: Internal Medicine

## 2020-04-13 DIAGNOSIS — E039 Hypothyroidism, unspecified: Secondary | ICD-10-CM

## 2020-04-17 ENCOUNTER — Encounter: Payer: Self-pay | Admitting: Internal Medicine

## 2020-05-22 DIAGNOSIS — H52203 Unspecified astigmatism, bilateral: Secondary | ICD-10-CM | POA: Diagnosis not present

## 2020-05-22 DIAGNOSIS — H26491 Other secondary cataract, right eye: Secondary | ICD-10-CM | POA: Diagnosis not present

## 2020-05-22 DIAGNOSIS — H35372 Puckering of macula, left eye: Secondary | ICD-10-CM | POA: Diagnosis not present

## 2020-05-22 DIAGNOSIS — H43813 Vitreous degeneration, bilateral: Secondary | ICD-10-CM | POA: Diagnosis not present

## 2020-05-24 ENCOUNTER — Other Ambulatory Visit: Payer: Self-pay

## 2020-05-24 ENCOUNTER — Encounter: Payer: Self-pay | Admitting: Internal Medicine

## 2020-05-24 ENCOUNTER — Telehealth: Payer: Self-pay | Admitting: Internal Medicine

## 2020-05-24 ENCOUNTER — Ambulatory Visit (INDEPENDENT_AMBULATORY_CARE_PROVIDER_SITE_OTHER): Payer: Medicare Other | Admitting: Internal Medicine

## 2020-05-24 VITALS — BP 168/72 | HR 73 | Temp 98.2°F | Wt 202.8 lb

## 2020-05-24 DIAGNOSIS — I10 Essential (primary) hypertension: Secondary | ICD-10-CM

## 2020-05-24 MED ORDER — AMLODIPINE BESYLATE 5 MG PO TABS: 5.0000 mg | ORAL_TABLET | Freq: Every day | ORAL | 1 refills | Status: DC

## 2020-05-24 NOTE — Progress Notes (Signed)
Subjective:    Patient ID: Valerie Jackson, female    DOB: 02-25-1946, 74 y.o.   MRN: [id number]  HPI The patient is here for an acute visit.   Her BP has been all over the place.  According to her machine her BP has been elevated most of this week.  She checks it if she is not feeling well only, not on a regular basis.  She has been waking up with headaches and has very mild nausea and lightheadedness as well. She has also been tired for no reason.   BP has been up to 170-199/ over 100.  She denies changes in medications/supplements.  She is not active.  She has been on BP medications over the years - indapamide was d/c'd in May.     Medications and allergies reviewed with patient and updated if appropriate.  Patient Active Problem List   Diagnosis Date Noted  . Acute cystitis without hematuria 03/21/2020  . Acute gout due to renal impairment involving left foot 01/17/2020  . Diuretic-induced hypokalemia 11/17/2019  . B12 deficiency 11/21/2018  . Myalgia 03/18/2018  . Asthma 08/19/2017  . Eczema 06/29/2017  . Hyperparathyroidism ([place]) 04/07/2017  . Migraine without aura and without status migrainosus, not intractable 11/11/2016  . Screening for osteoporosis 10/05/2016  . Symptomatic PVCs 05/19/2016  . Osteopenia 08/23/2015  . Insomnia due to anxiety and fear 09/18/2014  . Esophageal reflux 04/06/2014  . Depression with anxiety 04/06/2014  . CKD (chronic kidney disease) stage 2, GFR 60-89 ml/min 03/03/2014  . Emphysema lung ([place]) 11/08/2013  . Sickle cell trait ([place]) 07/19/2013  . Obesity (BMI 30-39.9) 04/12/2013  . Hyperlipidemia with target LDL less than 160 02/28/2013  . Hypertension 02/28/2013  . Hypothyroidism 02/28/2013    Current Outpatient Medications on File Prior to Visit  Medication Sig Dispense Refill  . acetaminophen (TYLENOL) 500 MG tablet Take 1 tablet (500 mg total) by mouth every 6 (six) hours as needed. 30 tablet 0  . ALPRAZolam (XANAX) 0.5 MG  tablet Take 1 tablet (0.5 mg total) by mouth at bedtime as needed for anxiety. 30 tablet 5  . Calcium Carbonate-Vit D-Min (CALCIUM 1200 PO) Take 1 tablet by mouth daily.    . cholecalciferol (VITAMIN D) 1000 UNITS tablet Take 2,000 Units by mouth daily.     . colchicine 0.6 MG tablet Take 1 tablet (0.6 mg total) by mouth daily. 30 tablet 5  . colestipol (COLESTID) 1 g tablet Take 1 tablet (1 g total) by mouth 2 (two) times daily. 60 tablet 3  . cyanocobalamin (,VITAMIN B-12,) 1000 MCG/ML injection Inject 1 mL (1,000 mcg total) into the muscle every 30 (thirty) days. 4 mL 3  . levothyroxine (SYNTHROID) 100 MCG tablet TAKE 1 TABLET(100 MCG) BY MOUTH DAILY 90 tablet 0  . methocarbamol (ROBAXIN) 500 MG tablet Take 1 tablet (500 mg total) by mouth every 8 (eight) hours as needed for muscle spasms. 60 tablet 3  . potassium chloride SA (KLOR-CON) 20 MEQ tablet TAKE 1 TABLET(20 MEQ) BY MOUTH TWICE DAILY 180 tablet 0  . rizatriptan (MAXALT-MLT) 10 MG disintegrating tablet Take 1 tablet (10 mg total) by mouth as needed for migraine. May repeat in 2 hours if needed 30 tablet 1  . SYRINGE-NEEDLE, DISP, 3 ML 25G X 5/8" 3 ML MISC 1 each by Does not apply route every 30 (thirty) days. 50 each 0  . tiZANidine (ZANAFLEX) 4 MG tablet Take 1 tablet (4 mg total) by mouth every 6 (  six) hours as needed for muscle spasms. 30 tablet 0  . Fluticasone Furoate (ARNUITY ELLIPTA) 100 MCG/ACT AEPB Inhale 1 puff into the lungs daily. 90 each 3  . [DISCONTINUED] indapamide (LOZOL) 1.25 MG tablet Take 1 tablet (1.25 mg total) by mouth daily. 90 tablet 0  . [DISCONTINUED] levothyroxine (SYNTHROID) 100 MCG tablet Take 1 tablet (100 mcg total) by mouth daily. 90 tablet 0  . [DISCONTINUED] potassium chloride SA (KLOR-CON) 20 MEQ tablet Take 1 tablet (20 mEq total) by mouth 2 (two) times daily. 180 tablet 0   No current facility-administered medications on file prior to visit.    Past Medical History:  Diagnosis Date  . Arthritis      fingers  . Blood clot in vein 1968  . Cataracts, bilateral   . CHF (congestive heart failure) ([place])   . Chronic kidney disease    CKD stage 2  . COPD (chronic obstructive pulmonary disease) ([place])   . Depression   . Diverticulitis 09/2013   with perforation  . Dizziness    with decreased BP readings or bending over  . Floaters   . GERD (gastroesophageal reflux disease)   . H/O hiatal hernia   . Headache(784.0)    migraine every 5- to 6 years  . Heart murmur since age 44   mild  . Hepatitis age 24 or 66   had heapatitis, not sure which kind  . History of blood transfusion 1965  . History of frequent urinary tract infections   . Hypercholesteremia   . Hypertension   . Hypothyroid   . Obesity   . Pericolonic abscess 2015  . Pneumonia   . Sickle cell trait [facility])     Past Surgical History:  Procedure Laterality Date  . CHOLECYSTECTOMY  2002  . COLON RESECTION N/A 10/11/2013   Procedure: EXPLORATORY LAPAROTOMY WITH RECTOSIGMOID COLECTOMY AND END COLOSTOMY;  Surgeon: Stark Klein, MD;  Location: [facility];  Service: General;  Laterality: N/A;  . COLOSTOMY TAKEDOWN N/A 02/27/2014   Procedure: COLOSTOMY TAKEDOWN [name] HERNIA REPAIR;  Surgeon: Stark Klein, MD;  Location: [facility];  Service: General;  Laterality: N/A;  . INCISIONAL HERNIA REPAIR N/A 03/04/2016   Procedure: LAPAROSCOPIC INCISIONAL HERNIA WITH MESH;  Surgeon: Ralene Ok, MD;  Location: [facility];  Service: General;  Laterality: N/A;  . LAPAROSCOPIC LYSIS OF ADHESIONS N/A 03/04/2016   Procedure: LAPAROSCOPIC LYSIS OF ADHESIONS;  Surgeon: Ralene Ok, MD;  Location: [facility];  Service: General;  Laterality: N/A;  . RIGHT HEART CATH N/A 06/24/2017   Procedure: RIGHT HEART CATH;  Surgeon: Jolaine Artist, MD;  Location: [facility];  Service: Cardiovascular;  Laterality: N/A;  . TONSILLECTOMY  1952    Social History   Socioeconomic History  . Marital status: Divorced    Spouse name: Not on file  .  Number of children: 1  . Years of education: 85  . Highest education level: Not on file  Occupational History  . Occupation: Retired    Comment: Therapist, sports  Tobacco Use  . Smoking status: Former Smoker    Packs/day: 0.50    Years: 52.00    Pack years: 26.00    Types: Cigarettes    Quit date: 09/06/2012    Years since quitting: 7.7  . Smokeless tobacco: Never Used  . Tobacco comment: never smoked a full PPD.   Vaping Use  . Vaping Use: Never used  Substance and Sexual Activity  . Alcohol use: No    Alcohol/week: 0.0  standard drinks    Comment: very rarely  . Drug use: No  . Sexual activity: Not Currently  Other Topics Concern  . Not on file  Social History Narrative   Regular exercise-no   Caffeine Use-yes   Social Determinants of Health   Financial Resource Strain:   . Difficulty of Paying Living Expenses: Not on file  Food Insecurity:   . Worried About Charity fundraiser in the Last Year: Not on file  . Ran Out of Food in the Last Year: Not on file  Transportation Needs:   . Lack of Transportation (Medical): Not on file  . Lack of Transportation (Non-Medical): Not on file  Physical Activity:   . Days of Exercise per Week: Not on file  . Minutes of Exercise per Session: Not on file  Stress:   . Feeling of Stress : Not on file  Social Connections:   . Frequency of Communication with Friends and Family: Not on file  . Frequency of Social Gatherings with Friends and Family: Not on file  . Attends Religious Services: Not on file  . Active Member of Clubs or Organizations: Not on file  . Attends Archivist Meetings: Not on file  . Marital Status: Not on file    Family History  Problem Relation Age of Onset  . Hypertension Mother   . Aneurysm Mother        Cerebral (died from this)  . Cancer Mother        Uterine  . Cancer Other   . Arthritis Sister   . Hyperlipidemia Sister   . Hypertension Sister   . Stroke Sister   . Kidney disease Sister         Cancer  . Hyperlipidemia Brother   . Glaucoma Brother   . Thyroid cancer Daughter   . Breast cancer Maternal Grandmother 78  . Hypertension Maternal Grandmother   . Hypercalcemia Neg Hx     Review of Systems  Constitutional: Negative for fever.  Eyes: Negative for visual disturbance.  Respiratory: Negative for cough, shortness of breath and wheezing.   Cardiovascular: Negative for chest pain, palpitations and leg swelling.  Gastrointestinal: Positive for nausea.  Neurological: Positive for light-headedness (mild in the mornings) and headaches.       Objective:   Vitals:   05/24/20 1546  BP: (!) 168/72  Pulse: 73  Temp: 98.2 F (36.8 C)  SpO2: 98%   BP Readings from Last 3 Encounters:  05/24/20 (!) 168/72  03/14/20 136/88  01/17/20 114/78   Wt Readings from Last 3 Encounters:  05/24/20 202 lb 12.8 oz (92 kg)  03/14/20 200 lb (90.7 kg)  01/17/20 201 lb (91.2 kg)   Body mass index is 33.75 kg/m.   Physical Exam    Constitutional: Appears well-developed and well-nourished. No distress.  Head: Normocephalic and atraumatic.  Neck: Neck supple. No tracheal deviation present. No thyromegaly present.  No cervical lymphadenopathy Cardiovascular: Normal rate, regular rhythm and normal heart sounds.  No murmur heard. No carotid bruit .  No edema Pulmonary/Chest: Effort normal and breath sounds normal. No respiratory distress. No has no wheezes. No rales.  Skin: Skin is warm and dry. Not diaphoretic.  Psychiatric: Normal mood and affect. Behavior is normal.       Assessment & Plan:    Hypertension:  Chronic, not controlled Advised her to monitor BP daily even if she is feeling well Start amlodipine 5 mg daily Has FU w/ Dr Posey Pronto in  early October Can f/u with [place] sooner if needed or if she has side effects from medication   See Problem List for Assessment and Plan of chronic medical problems.    This visit occurred during the SARS-CoV-2 public health emergency.   Safety protocols were in place, including screening questions prior to the visit, additional usage of staff PPE, and extensive cleaning of exam room while observing appropriate contact time as indicated for disinfecting solutions.

## 2020-05-24 NOTE — Telephone Encounter (Addendum)
    Patient calling to report elevated BP Last readings 177/109, 16?/98 accompanied by nausea and headache Appointment 9/10  Call transferred to [facility] for immediate advice

## 2020-05-24 NOTE — Telephone Encounter (Signed)
[facility] REPORT/CALL :   -Caller states, pt has high bp 196/107, nausea and headache.   Advise see PCP within 24 hours.   Appointment made for today 9/10.

## 2020-05-24 NOTE — Patient Instructions (Addendum)
Continue to monitor your BP at home and keep a record of it.    Medications reviewed and updated.  Changes include :   Start amlodipine 5 mg daily - you can take at night or in the morning.   Your prescription(s) have been submitted to your pharmacy. Please take as directed and contact our office if you believe you are having problem(s) with the medication(s).  Following with Dr Posey Pronto next month as scheduled.

## 2020-05-25 ENCOUNTER — Other Ambulatory Visit: Payer: Self-pay | Admitting: Internal Medicine

## 2020-05-25 DIAGNOSIS — T502X5A Adverse effect of carbonic-anhydrase inhibitors, benzothiadiazides and other diuretics, initial encounter: Secondary | ICD-10-CM

## 2020-05-25 DIAGNOSIS — E876 Hypokalemia: Secondary | ICD-10-CM

## 2020-05-27 ENCOUNTER — Emergency Department (HOSPITAL_COMMUNITY)
Admission: EM | Admit: 2020-05-27 | Discharge: 2020-05-27 | Disposition: A | Discharge status: Home / Self Care | Payer: Medicare Other | Attending: Emergency Medicine | Admitting: Emergency Medicine

## 2020-05-27 ENCOUNTER — Other Ambulatory Visit: Payer: Self-pay

## 2020-05-27 ENCOUNTER — Emergency Department (HOSPITAL_COMMUNITY): Payer: Medicare Other

## 2020-05-27 ENCOUNTER — Telehealth: Payer: Self-pay | Admitting: Internal Medicine

## 2020-05-27 ENCOUNTER — Ambulatory Visit: Payer: Medicare Other | Admitting: Internal Medicine

## 2020-05-27 ENCOUNTER — Encounter (HOSPITAL_COMMUNITY): Payer: Self-pay

## 2020-05-27 DIAGNOSIS — R1110 Vomiting, unspecified: Secondary | ICD-10-CM | POA: Insufficient documentation

## 2020-05-27 DIAGNOSIS — Z5321 Procedure and treatment not carried out due to patient leaving prior to being seen by health care provider: Secondary | ICD-10-CM | POA: Diagnosis not present

## 2020-05-27 DIAGNOSIS — I10 Essential (primary) hypertension: Secondary | ICD-10-CM | POA: Insufficient documentation

## 2020-05-27 DIAGNOSIS — R110 Nausea: Secondary | ICD-10-CM | POA: Diagnosis not present

## 2020-05-27 LAB — BASIC METABOLIC PANEL
Anion gap: 13 (ref 5–15)
BUN: 13 mg/dL (ref 8–23)
CO2: 27 mmol/L (ref 22–32)
Calcium: 10.3 mg/dL (ref 8.9–10.3)
Chloride: 102 mmol/L (ref 98–111)
Creatinine, Ser: 1.66 mg/dL — ABNORMAL HIGH (ref 0.44–1.00)
GFR calc Af Amer: 35 mL/min — ABNORMAL LOW (ref >60)
GFR calc non Af Amer: 30 mL/min — ABNORMAL LOW (ref >60)
Glucose, Bld: 114 mg/dL — ABNORMAL HIGH (ref 70–99)
Potassium: 3.6 mmol/L (ref 3.5–5.1)
Sodium: 142 mmol/L (ref 135–145)

## 2020-05-27 LAB — CBC
HCT: 45.7 % (ref 36.0–46.0)
Hemoglobin: 15.4 g/dL — ABNORMAL HIGH (ref 12.0–15.0)
MCH: 30.4 pg (ref 26.0–34.0)
MCHC: 33.7 g/dL (ref 30.0–36.0)
MCV: 90.1 fL (ref 80.0–100.0)
Platelets: 239 10*3/uL (ref 150–400)
RBC: 5.07 MIL/uL (ref 3.87–5.11)
RDW: 12.7 % (ref 11.5–15.5)
WBC: 9.6 10*3/uL (ref 4.0–10.5)
nRBC: 0 % (ref 0.0–0.2)

## 2020-05-27 LAB — LIPASE, BLOOD: Lipase: 18 U/L (ref 11–51)

## 2020-05-27 LAB — TROPONIN I (HIGH SENSITIVITY): Troponin I (High Sensitivity): 4 ng/L (ref <18)

## 2020-05-27 NOTE — Telephone Encounter (Signed)
    Patient calling to report elevated BP 170/112 184/110 Nausea, headache, vomiting  Patient was started on amLODipine (NORVASC) 5 MG tablet on 9/10 by Dr Quay Burow  Call transferred to [place]

## 2020-05-27 NOTE — ED Notes (Signed)
Patient has a blue top in the main lab °

## 2020-05-27 NOTE — ED Notes (Signed)
Pt requests vitals sign recheck. VS unchanged from previous. Pt sts she was able to tolerate some food without vomiting and would follow up with PCP tomorrow.

## 2020-05-27 NOTE — ED Triage Notes (Addendum)
Pt arrives today stating that her blood pressure has been running high. Pt states she was recently started on BP meds (3-4 days ago).  Pt states she has a headache as well. Pt has had emesis x 3 today

## 2020-05-30 NOTE — Telephone Encounter (Signed)
[facility] Report/Call : ---Caller states the daughter is calling for her mother who has elevated blood pressure of 167/106. She is vomiting and having diarrhea. Had issue the other day, went to ED.  Advised go to ED now.

## 2020-05-30 NOTE — Telephone Encounter (Signed)
   Patients daughter calling to report BP 167/106 HR 80 Daughter states they went to the ED on 9/13 after waiting for several hours,left without being seen Patient continues to have elevated BP, nausea, vomiting, diarrhea  Call transferred to [facility]

## 2020-06-17 ENCOUNTER — Encounter: Payer: Self-pay | Admitting: Internal Medicine

## 2020-06-17 ENCOUNTER — Ambulatory Visit (INDEPENDENT_AMBULATORY_CARE_PROVIDER_SITE_OTHER): Payer: Medicare Other | Admitting: Internal Medicine

## 2020-06-17 ENCOUNTER — Other Ambulatory Visit: Payer: Self-pay

## 2020-06-17 VITALS — BP 128/88 | HR 83 | Temp 98.5°F | Ht 65.0 in | Wt 201.0 lb

## 2020-06-17 DIAGNOSIS — Z23 Encounter for immunization: Secondary | ICD-10-CM

## 2020-06-17 DIAGNOSIS — I10 Essential (primary) hypertension: Secondary | ICD-10-CM

## 2020-06-17 DIAGNOSIS — E785 Hyperlipidemia, unspecified: Secondary | ICD-10-CM

## 2020-06-17 DIAGNOSIS — E538 Deficiency of other specified B group vitamins: Secondary | ICD-10-CM

## 2020-06-17 DIAGNOSIS — M1A09X0 Idiopathic chronic gout, multiple sites, without tophus (tophi): Secondary | ICD-10-CM | POA: Diagnosis not present

## 2020-06-17 DIAGNOSIS — E213 Hyperparathyroidism, unspecified: Secondary | ICD-10-CM

## 2020-06-17 DIAGNOSIS — E039 Hypothyroidism, unspecified: Secondary | ICD-10-CM

## 2020-06-17 LAB — LIPID PANEL
Cholesterol: 256 mg/dL — ABNORMAL HIGH (ref 0–200)
HDL: 57.2 mg/dL (ref >39.00)
NonHDL: 198.87
Total CHOL/HDL Ratio: 4
Triglycerides: 210 mg/dL — ABNORMAL HIGH (ref 0.0–149.0)
VLDL: 42 mg/dL — ABNORMAL HIGH (ref 0.0–40.0)

## 2020-06-17 LAB — TSH: TSH: 20.75 u[IU]/mL — ABNORMAL HIGH (ref 0.35–4.50)

## 2020-06-17 LAB — LDL CHOLESTEROL, DIRECT: Direct LDL: 165 mg/dL

## 2020-06-17 LAB — URIC ACID: Uric Acid, Serum: 8.5 mg/dL — ABNORMAL HIGH (ref 2.4–7.0)

## 2020-06-17 MED ORDER — CYANOCOBALAMIN 1000 MCG/ML IJ SOLN: 1000.0000 ug | INTRAMUSCULAR | 3 refills | Status: DC

## 2020-06-17 NOTE — Progress Notes (Signed)
Subjective:  Patient ID: Valerie Jackson, female    DOB: 1946/03/06  Age: 74 y.o. MRN: [id number]  CC: Hypothyroidism and Hyperlipidemia  This visit occurred during the SARS-CoV-2 public health emergency.  Safety protocols were in place, including screening questions prior to the visit, additional usage of staff PPE, and extensive cleaning of exam room while observing appropriate contact time as indicated for disinfecting solutions.    HPI [place] presents for f/up - She is recovering from a recent gout attack.  She was also recently seen in the ED for an elevated blood pressure.  She does not know why her blood pressure was so high.  She is active and denies any recent episodes of chest pain, shortness of breath, palpitations, edema, or headache.  She complains of moderately severe fatigue.  She thinks she is taking indapamide but she does not think she is taking amlodipine because it causes dizziness.  Outpatient Medications Prior to Visit  Medication Sig Dispense Refill   acetaminophen (TYLENOL) 500 MG tablet Take 1 tablet (500 mg total) by mouth every 6 (six) hours as needed. 30 tablet 0   ALPRAZolam (XANAX) 0.5 MG tablet Take 1 tablet (0.5 mg total) by mouth at bedtime as needed for anxiety. 30 tablet 5   amLODipine (NORVASC) 5 MG tablet Take 1 tablet (5 mg total) by mouth daily. 90 tablet 1   Calcium Carbonate-Vit D-Min (CALCIUM 1200 PO) Take 1 tablet by mouth daily.     cholecalciferol (VITAMIN D) 1000 UNITS tablet Take 2,000 Units by mouth daily.      colchicine 0.6 MG tablet Take 1 tablet (0.6 mg total) by mouth daily. 30 tablet 5   colestipol (COLESTID) 1 g tablet Take 1 tablet (1 g total) by mouth 2 (two) times daily. 60 tablet 3   Fluticasone Furoate (ARNUITY ELLIPTA) 100 MCG/ACT AEPB Inhale 1 puff into the lungs daily. 90 each 3   methocarbamol (ROBAXIN) 500 MG tablet Take 1 tablet (500 mg total) by mouth every 8 (eight) hours as needed for muscle spasms. 60  tablet 3   potassium chloride SA (KLOR-CON) 20 MEQ tablet TAKE 1 TABLET(20 MEQ) BY MOUTH TWICE DAILY 180 tablet 0   rizatriptan (MAXALT-MLT) 10 MG disintegrating tablet Take 1 tablet (10 mg total) by mouth as needed for migraine. May repeat in 2 hours if needed 30 tablet 1   SYRINGE-NEEDLE, DISP, 3 ML 25G X 5/8" 3 ML MISC 1 each by Does not apply route every 30 (thirty) days. 50 each 0   tiZANidine (ZANAFLEX) 4 MG tablet Take 1 tablet (4 mg total) by mouth every 6 (six) hours as needed for muscle spasms. 30 tablet 0   cyanocobalamin (,VITAMIN B-12,) 1000 MCG/ML injection Inject 1 mL (1,000 mcg total) into the muscle every 30 (thirty) days. 4 mL 3   levothyroxine (SYNTHROID) 100 MCG tablet TAKE 1 TABLET(100 MCG) BY MOUTH DAILY 90 tablet 0   No facility-administered medications prior to visit.    ROS Review of Systems  Constitutional: Positive for fatigue. Negative for appetite change, diaphoresis and unexpected weight change.  HENT: Negative.   Eyes: Negative for visual disturbance.  Respiratory: Negative for cough, chest tightness, shortness of breath and wheezing.   Cardiovascular: Negative for chest pain, palpitations and leg swelling.  Gastrointestinal: Negative for abdominal pain, constipation, diarrhea, nausea and vomiting.  Endocrine: Negative.  Negative for cold intolerance and heat intolerance.  Genitourinary: Negative.  Negative for difficulty urinating, dysuria and hematuria.  Musculoskeletal: Positive  for arthralgias. Negative for back pain and myalgias.  Skin: Negative.  Negative for color change and pallor.  Neurological: Positive for dizziness. Negative for weakness, light-headedness and headaches.  Hematological: Negative for adenopathy. Does not bruise/bleed easily.  Psychiatric/Behavioral: Negative.     Objective:  BP 128/88    Pulse 83    Temp 98.5 F (36.9 C) (Oral)    Ht 5' 5"  (1.651 m)    Wt 201 lb (91.2 kg)    SpO2 97%    BMI 33.45 kg/m   BP Readings from  Last 3 Encounters:  06/17/20 128/88  05/24/20 (!) 168/72  03/14/20 136/88    Wt Readings from Last 3 Encounters:  06/17/20 201 lb (91.2 kg)  05/24/20 202 lb 12.8 oz (92 kg)  03/14/20 200 lb (90.7 kg)    Physical Exam Vitals reviewed.  Constitutional:      Appearance: Normal appearance.  HENT:     Nose: Nose normal.     Mouth/Throat:     Mouth: Mucous membranes are moist.  Eyes:     General: No scleral icterus.    Conjunctiva/sclera: Conjunctivae normal.  Cardiovascular:     Rate and Rhythm: Normal rate and regular rhythm.     Heart sounds: No murmur heard.   Pulmonary:     Effort: Pulmonary effort is normal.     Breath sounds: No stridor. No wheezing, rhonchi or rales.  Abdominal:     General: Abdomen is flat.     Palpations: There is no mass.     Tenderness: There is no abdominal tenderness. There is no guarding.  Musculoskeletal:        General: Normal range of motion.     Cervical back: Neck supple.     Right lower leg: No edema.     Left lower leg: No edema.  Lymphadenopathy:     Cervical: No cervical adenopathy.  Skin:    General: Skin is warm and dry.  Neurological:     General: No focal deficit present.     Mental Status: She is alert.  Psychiatric:        Mood and Affect: Mood normal.        Behavior: Behavior normal.     Lab Results  Component Value Date   WBC 9.6 05/27/2020   HGB 15.4 (H) 05/27/2020   HCT 45.7 05/27/2020   PLT 239 05/27/2020   GLUCOSE 114 (H) 05/27/2020   CHOL 256 (H) 06/17/2020   TRIG 210.0 (H) 06/17/2020   HDL 57.20 06/17/2020   LDLDIRECT 165.0 06/17/2020   LDLCALC 130 (H) 01/16/2019   ALT 23 01/16/2019   AST 17 01/16/2019   NA 142 05/27/2020   K 3.6 05/27/2020   CL 102 05/27/2020   CREATININE 1.66 (H) 05/27/2020   BUN 13 05/27/2020   CO2 27 05/27/2020   TSH 20.75 (H) 06/17/2020   INR 1.02 06/24/2017   HGBA1C 4.7 05/14/2014    US BREAST LTD UNI LEFT INC AXILLA  Result Date: 01/03/2020 CLINICAL DATA:   74 year old female presenting as a recall from screening for possible left breast mass. EXAM: DIGITAL DIAGNOSTIC LEFT MAMMOGRAM WITH TOMO ULTRASOUND LEFT BREAST COMPARISON:  Previous exam(s). ACR Breast Density Category b: There are scattered areas of fibroglandular density. FINDINGS: Mammogram: Spot compression tomosynthesis views of the left breast demonstrate persistence of an oval circumscribed mass measuring approximately 0.5 cm in the outer left breast. There is an additional nearby oval circumscribed mass posterior to this measuring 0.4 cm. Ultrasound:  Targeted ultrasound is performed in the left breast at 2:30 o'clock 5 cm from the nipple demonstrating a cluster of microcysts measuring 0.6 x 0.4 x 0.5 cm. This corresponds to the mass seen mammographically. At 2:30 o'clock 6 cm from the nipple there is an oval circumscribed anechoic mass measuring 0.5 x 0.4 x 0.5 cm, consistent with a benign simple cyst. This likely corresponds to the other small mass seen mammographically. IMPRESSION: Left breast masses at 2:30 o'clock are consistent with benign cluster of cysts and simple cysts. No mammographic or sonographic evidence of malignancy. RECOMMENDATION: Screening mammogram in one year.(Code:SM-B-01Y) I have discussed the findings and recommendations with the patient. If applicable, a reminder letter will be sent to the patient regarding the next appointment. BI-RADS CATEGORY  2: Benign. Electronically Signed   By: Audie Pinto M.D.   On: 01/03/2020 11:42   MM DIAG BREAST TOMO UNI LEFT  Result Date: 01/03/2020 CLINICAL DATA:  74 year old female presenting as a recall from screening for possible left breast mass. EXAM: DIGITAL DIAGNOSTIC LEFT MAMMOGRAM WITH TOMO ULTRASOUND LEFT BREAST COMPARISON:  Previous exam(s). ACR Breast Density Category b: There are scattered areas of fibroglandular density. FINDINGS: Mammogram: Spot compression tomosynthesis views of the left breast demonstrate persistence of an  oval circumscribed mass measuring approximately 0.5 cm in the outer left breast. There is an additional nearby oval circumscribed mass posterior to this measuring 0.4 cm. Ultrasound: Targeted ultrasound is performed in the left breast at 2:30 o'clock 5 cm from the nipple demonstrating a cluster of microcysts measuring 0.6 x 0.4 x 0.5 cm. This corresponds to the mass seen mammographically. At 2:30 o'clock 6 cm from the nipple there is an oval circumscribed anechoic mass measuring 0.5 x 0.4 x 0.5 cm, consistent with a benign simple cyst. This likely corresponds to the other small mass seen mammographically. IMPRESSION: Left breast masses at 2:30 o'clock are consistent with benign cluster of cysts and simple cysts. No mammographic or sonographic evidence of malignancy. RECOMMENDATION: Screening mammogram in one year.(Code:SM-B-01Y) I have discussed the findings and recommendations with the patient. If applicable, a reminder letter will be sent to the patient regarding the next appointment. BI-RADS CATEGORY  2: Benign. Electronically Signed   By: Audie Pinto M.D.   On: 01/03/2020 11:42    Assessment & Plan:   [name] was seen today for hypothyroidism and hyperlipidemia.  Diagnoses and all orders for this visit:  Hyperparathyroidism ([place])- Her Ca++ level is normal  Acquired hypothyroidism- Her TSH is nearly 21 and she is symptomatic.  I recommended that she increase the dose of levothyroxine and switch to the brand name. -     TSH; Future -     TSH -     levothyroxine (SYNTHROID) 150 MCG tablet; Take 1 tablet (150 mcg total) by mouth daily before breakfast.  Primary hypertension- Her blood pressure is adequately well controlled.  Idiopathic chronic gout of multiple sites without tophus- She has not achieved a uric acid goal of less than 6 and she is negative for HLA B 58:01.1 so I recommended that she start taking allopurinol to reduce the risk of further gout attacks. -     HLA-B*58:01 Typing;  Future -     Uric acid; Future -     Uric acid -     HLA-B*58:01 Typing -     allopurinol (ZYLOPRIM) 100 MG tablet; Take 1 tablet (100 mg total) by mouth daily.  Hyperlipidemia with target LDL less than 160- To date she  has not agreed to take a statin for CV risk reduction.  I have asked her to let me know if she is try willing to try one of the new statins like Livalo. -     Lipid panel; Future -     Lipid panel  Flu vaccine need -     Flu Vaccine QUAD High Dose(Fluad)  B12 deficiency -     cyanocobalamin (,VITAMIN B-12,) 1000 MCG/ML injection; Inject 1 mL (1,000 mcg total) into the muscle every 30 (thirty) days.  Other orders -     LDL cholesterol, direct   I have discontinued [place] levothyroxine. I am also having her start on levothyroxine and allopurinol. Additionally, I am having her maintain her cholecalciferol, rizatriptan, methocarbamol, Calcium Carbonate-Vit D-Min (CALCIUM 1200 PO), Arnuity Ellipta, ALPRAZolam, tiZANidine, acetaminophen, colchicine, colestipol, SYRINGE-NEEDLE (DISP) 3 ML, amLODipine, potassium chloride SA, and cyanocobalamin.  Meds ordered this encounter  Medications   cyanocobalamin (,VITAMIN B-12,) 1000 MCG/ML injection    Sig: Inject 1 mL (1,000 mcg total) into the muscle every 30 (thirty) days.    Dispense:  4 mL    Refill:  3   levothyroxine (SYNTHROID) 150 MCG tablet    Sig: Take 1 tablet (150 mcg total) by mouth daily before breakfast.    Dispense:  90 tablet    Refill:  0   allopurinol (ZYLOPRIM) 100 MG tablet    Sig: Take 1 tablet (100 mg total) by mouth daily.    Dispense:  90 tablet    Refill:  1   I spent 50 minutes in preparing to see the patient by review of recent labs, imaging and procedures, obtaining and reviewing separately obtained history, communicating with the patient and family or caregiver, ordering medications, tests or procedures, and documenting clinical information in the EHR including the differential Dx,  treatment, and any further evaluation and other management of 1. Hyperparathyroidism ([place]) 2. Acquired hypothyroidism 3. Primary hypertension 4. Idiopathic chronic gout of multiple sites without tophus 5. Hyperlipidemia with target LDL less than 160 6. B12 deficiency      Follow-up: No follow-ups on file.  [name], MD

## 2020-06-18 MED ORDER — LEVOTHYROXINE SODIUM 150 MCG PO TABS: 150.0000 ug | ORAL_TABLET | Freq: Every day | ORAL | 0 refills | Status: DC

## 2020-06-22 LAB — HLA-B*58:01 TYPING: HLA-B*58:01 Typing: NEGATIVE

## 2020-06-23 ENCOUNTER — Encounter: Payer: Self-pay | Admitting: Internal Medicine

## 2020-06-23 MED ORDER — ALLOPURINOL 100 MG PO TABS: 100.0000 mg | ORAL_TABLET | Freq: Every day | ORAL | 1 refills | Status: DC

## 2020-06-23 NOTE — Patient Instructions (Signed)
Hypothyroidism  Hypothyroidism is when the thyroid gland does not make enough of certain hormones (it is underactive). The thyroid gland is a small gland located in the lower front part of the neck, just in front of the windpipe (trachea). This gland makes hormones that help control how the body uses food for energy (metabolism) as well as how the heart and brain function. These hormones also play a role in keeping your bones strong. When the thyroid is underactive, it produces too little of the hormones thyroxine (T4) and triiodothyronine (T3). What are the causes? This condition may be caused by:  Hashimoto's disease. This is a disease in which the body's disease-fighting system (immune system) attacks the thyroid gland. This is the most common cause.  Viral infections.  Pregnancy.  Certain medicines.  Birth defects.  Past radiation treatments to the head or neck for cancer.  Past treatment with radioactive iodine.  Past exposure to radiation in the environment.  Past surgical removal of part or all of the thyroid.  Problems with a gland in the center of the brain (pituitary gland).  Lack of enough iodine in the diet. What increases the risk? You are more likely to develop this condition if:  You are female.  You have a family history of thyroid conditions.  You use a medicine called lithium.  You take medicines that affect the immune system (immunosuppressants). What are the signs or symptoms? Symptoms of this condition include:  Feeling as though you have no energy (lethargy).  Not being able to tolerate cold.  Weight gain that is not explained by a change in diet or exercise habits.  Lack of appetite.  Dry skin.  Coarse hair.  Menstrual irregularity.  Slowing of thought processes.  Constipation.  Sadness or depression. How is this diagnosed? This condition may be diagnosed based on:  Your symptoms, your medical history, and a physical exam.  Blood  tests. You may also have imaging tests, such as an ultrasound or MRI. How is this treated? This condition is treated with medicine that replaces the thyroid hormones that your body does not make. After you begin treatment, it may take several weeks for symptoms to go away. Follow these instructions at home:  Take over-the-counter and prescription medicines only as told by your health care provider.  If you start taking any new medicines, tell your health care provider.  Keep all follow-up visits as told by your health care provider. This is important. ? As your condition improves, your dosage of thyroid hormone medicine may change. ? You will need to have blood tests regularly so that your health care provider can monitor your condition. Contact a health care provider if:  Your symptoms do not get better with treatment.  You are taking thyroid replacement medicine and you: ? Sweat a lot. ? Have tremors. ? Feel anxious. ? Lose weight rapidly. ? Cannot tolerate heat. ? Have emotional swings. ? Have diarrhea. ? Feel weak. Get help right away if you have:  Chest pain.  An irregular heartbeat.  A rapid heartbeat.  Difficulty breathing. Summary  Hypothyroidism is when the thyroid gland does not make enough of certain hormones (it is underactive).  When the thyroid is underactive, it produces too little of the hormones thyroxine (T4) and triiodothyronine (T3).  The most common cause is Hashimoto's disease, a disease in which the body's disease-fighting system (immune system) attacks the thyroid gland. The condition can also be caused by viral infections, medicine, pregnancy, or past   radiation treatment to the head or neck.  Symptoms may include weight gain, dry skin, constipation, feeling as though you do not have energy, and not being able to tolerate cold.  This condition is treated with medicine to replace the thyroid hormones that your body does not make. This information  is not intended to replace advice given to you by your health care provider. Make sure you discuss any questions you have with your health care provider. Document Revised: 08/13/2017 Document Reviewed: 08/11/2017 [employer] Patient Education  2020 [employer]  

## 2020-06-24 ENCOUNTER — Encounter: Payer: Self-pay | Admitting: Internal Medicine

## 2020-06-24 DIAGNOSIS — N1832 Chronic kidney disease, stage 3b: Secondary | ICD-10-CM | POA: Diagnosis not present

## 2020-06-24 DIAGNOSIS — I129 Hypertensive chronic kidney disease with stage 1 through stage 4 chronic kidney disease, or unspecified chronic kidney disease: Secondary | ICD-10-CM | POA: Diagnosis not present

## 2020-06-24 DIAGNOSIS — N2581 Secondary hyperparathyroidism of renal origin: Secondary | ICD-10-CM | POA: Diagnosis not present

## 2020-06-24 DIAGNOSIS — D631 Anemia in chronic kidney disease: Secondary | ICD-10-CM | POA: Diagnosis not present

## 2020-07-04 ENCOUNTER — Ambulatory Visit: Payer: Medicare Other | Admitting: Physician Assistant

## 2020-07-05 ENCOUNTER — Ambulatory Visit (INDEPENDENT_AMBULATORY_CARE_PROVIDER_SITE_OTHER): Payer: Medicare Other

## 2020-07-05 ENCOUNTER — Other Ambulatory Visit: Payer: Self-pay

## 2020-07-05 VITALS — BP 150/90 | HR 84 | Temp 98.2°F | Ht 65.0 in | Wt 202.8 lb

## 2020-07-05 DIAGNOSIS — Z0000 Encounter for general adult medical examination without abnormal findings: Secondary | ICD-10-CM

## 2020-07-05 NOTE — Progress Notes (Signed)
Subjective:   Valerie Jackson is a 74 y.o. female who presents for Medicare Annual (Subsequent) preventive examination.  Review of Systems    No ROS. Medicare Wellness Visit. Additional risk factors are reflected in social history. Cardiac Risk Factors include: advanced age (>42mn, >>39women);dyslipidemia;family history of premature cardiovascular disease;hypertension;obesity (BMI >30kg/m2) Sleep Patterns: No sleep issues, feels rested on waking and sleeps 8 hours nightly. Home Safety/Smoke Alarms: Feels safe in home; uses home alarm. Smoke alarms in place. Living environment: 2-story home; Lives with daughter and grandson; no needs for DME; good support system. Seat Belt Safety/Bike Helmet: Wears seat belt.    Objective:    Today's Vitals   07/05/20 1443  BP: (!) 150/90  Pulse: 84  Temp: 98.2 F (36.8 C)  SpO2: 97%  Weight: 202 lb 12.8 oz (92 kg)  Height: 5' 5"  (1.651 m)  PainSc: 0-No pain   Body mass index is 33.75 kg/m.  Advanced Directives 07/05/2020 05/27/2020 09/24/2019 11/03/2017 06/21/2017 10/15/2016 03/04/2016  Does Patient Have a Medical Advance Directive? Yes No No No No No No  Does patient want to make changes to medical advance directive? No - Patient declined - - - - - No - Patient declined  Copy of [place] Chart? - - - - - - No - copy requested  Would patient like information on creating a medical advance directive? - Yes (ED - Information included in AVS) - No - Patient declined No - Patient declined No - Patient declined No - patient declined information  Pre-existing out of facility DNR order (yellow form or pink MOST form) - - - - - - -    Current Medications (verified) Outpatient Encounter Medications as of 07/05/2020  Medication Sig  . acetaminophen (TYLENOL) 500 MG tablet Take 1 tablet (500 mg total) by mouth every 6 (six) hours as needed.  .Marland Kitchenallopurinol (ZYLOPRIM) 100 MG tablet Take 1 tablet (100 mg total) by mouth daily.  .[name] ALPRAZolam (XANAX) 0.5 MG tablet Take 1 tablet (0.5 mg total) by mouth at bedtime as needed for anxiety.  .Marland KitchenamLODipine (NORVASC) 5 MG tablet Take 1 tablet (5 mg total) by mouth daily.  . Calcium Carbonate-Vit D-Min (CALCIUM 1200 PO) Take 1 tablet by mouth daily.  . cholecalciferol (VITAMIN D) 1000 UNITS tablet Take 2,000 Units by mouth daily.   . colchicine 0.6 MG tablet Take 1 tablet (0.6 mg total) by mouth daily.  . colestipol (COLESTID) 1 g tablet Take 1 tablet (1 g total) by mouth 2 (two) times daily.  . cyanocobalamin (,VITAMIN B-12,) 1000 MCG/ML injection Inject 1 mL (1,000 mcg total) into the muscle every 30 (thirty) days.  . Fluticasone Furoate (ARNUITY ELLIPTA) 100 MCG/ACT AEPB Inhale 1 puff into the lungs daily.  .Marland Kitchenlevothyroxine (SYNTHROID) 150 MCG tablet Take 1 tablet (150 mcg total) by mouth daily before breakfast.  . methocarbamol (ROBAXIN) 500 MG tablet Take 1 tablet (500 mg total) by mouth every 8 (eight) hours as needed for muscle spasms.  . potassium chloride SA (KLOR-CON) 20 MEQ tablet TAKE 1 TABLET(20 MEQ) BY MOUTH TWICE DAILY  . rizatriptan (MAXALT-MLT) 10 MG disintegrating tablet Take 1 tablet (10 mg total) by mouth as needed for migraine. May repeat in 2 hours if needed  . SYRINGE-NEEDLE, DISP, 3 ML 25G X 5/8" 3 ML MISC 1 each by Does not apply route every 30 (thirty) days.  .Marland KitchentiZANidine (ZANAFLEX) 4 MG tablet Take 1 tablet (4 mg total)  by mouth every 6 (six) hours as needed for muscle spasms.  . [DISCONTINUED] indapamide (LOZOL) 1.25 MG tablet Take 1 tablet (1.25 mg total) by mouth daily.  . [DISCONTINUED] levothyroxine (SYNTHROID) 100 MCG tablet Take 1 tablet (100 mcg total) by mouth daily.  . [DISCONTINUED] potassium chloride SA (KLOR-CON) 20 MEQ tablet Take 1 tablet (20 mEq total) by mouth 2 (two) times daily.  . [DISCONTINUED] potassium chloride SA (KLOR-CON) 20 MEQ tablet TAKE 1 TABLET(20 MEQ) BY MOUTH TWICE DAILY   No facility-administered encounter medications on file  as of 07/05/2020.    Allergies (verified) Crestor [rosuvastatin] and Estrogens   History: Past Medical History:  Diagnosis Date  . Arthritis    fingers  . Blood clot in vein 1968  . Cataracts, bilateral   . CHF (congestive heart failure) ([place])   . Chronic kidney disease    CKD stage 2  . COPD (chronic obstructive pulmonary disease) ([place])   . Depression   . Diverticulitis 09/2013   with perforation  . Dizziness    with decreased BP readings or bending over  . Floaters   . GERD (gastroesophageal reflux disease)   . H/O hiatal hernia   . Headache(784.0)    migraine every 5- to 6 years  . Heart murmur since age 78   mild  . Hepatitis age 26 or 19   had heapatitis, not sure which kind  . History of blood transfusion 1965  . History of frequent urinary tract infections   . Hypercholesteremia   . Hypertension   . Hypothyroid   . Obesity   . Pericolonic abscess 2015  . Pneumonia   . Sickle cell trait [facility])    Past Surgical History:  Procedure Laterality Date  . CHOLECYSTECTOMY  2002  . COLON RESECTION N/A 10/11/2013   Procedure: EXPLORATORY LAPAROTOMY WITH RECTOSIGMOID COLECTOMY AND END COLOSTOMY;  Surgeon: Stark Klein, MD;  Location: [facility];  Service: General;  Laterality: N/A;  . COLOSTOMY TAKEDOWN N/A 02/27/2014   Procedure: COLOSTOMY TAKEDOWN [name] HERNIA REPAIR;  Surgeon: Stark Klein, MD;  Location: [facility];  Service: General;  Laterality: N/A;  . INCISIONAL HERNIA REPAIR N/A 03/04/2016   Procedure: LAPAROSCOPIC INCISIONAL HERNIA WITH MESH;  Surgeon: Ralene Ok, MD;  Location: [facility];  Service: General;  Laterality: N/A;  . LAPAROSCOPIC LYSIS OF ADHESIONS N/A 03/04/2016   Procedure: LAPAROSCOPIC LYSIS OF ADHESIONS;  Surgeon: Ralene Ok, MD;  Location: [facility];  Service: General;  Laterality: N/A;  . RIGHT HEART CATH N/A 06/24/2017   Procedure: RIGHT HEART CATH;  Surgeon: Jolaine Artist, MD;  Location: [place] [facility];  Service: Cardiovascular;   Laterality: N/A;  . TONSILLECTOMY  1952   Family History  Problem Relation Age of Onset  . Hypertension Mother   . Aneurysm Mother        Cerebral (died from this)  . Cancer Mother        Uterine  . Cancer Other   . Arthritis Sister   . Hyperlipidemia Sister   . Hypertension Sister   . Stroke Sister   . Kidney disease Sister        Cancer  . Hyperlipidemia Brother   . Glaucoma Brother   . Thyroid cancer Daughter   . Breast cancer Maternal Grandmother 78  . Hypertension Maternal Grandmother   . Hypercalcemia Neg Hx    Social History   Socioeconomic History  . Marital status: Divorced    Spouse name: Not on file  . Number  of children: 1  . Years of education: 25  . Highest education level: Not on file  Occupational History  . Occupation: Retired    Comment: Therapist, sports  Tobacco Use  . Smoking status: Former Smoker    Packs/day: 0.50    Years: 52.00    Pack years: 26.00    Types: Cigarettes    Quit date: 09/06/2012    Years since quitting: 7.8  . Smokeless tobacco: Never Used  . Tobacco comment: never smoked a full PPD.   Vaping Use  . Vaping Use: Never used  Substance and Sexual Activity  . Alcohol use: No    Alcohol/week: 0.0 standard drinks    Comment: very rarely  . Drug use: No  . Sexual activity: Not Currently  Other Topics Concern  . Not on file  Social History Narrative   Regular exercise-no   Caffeine Use-yes   Social Determinants of Health   Financial Resource Strain: Low Risk   . Difficulty of Paying Living Expenses: Not hard at all  Food Insecurity: No Food Insecurity  . Worried About Charity fundraiser in the Last Year: Never true  . Ran Out of Food in the Last Year: Never true  Transportation Needs: No Transportation Needs  . Lack of Transportation (Medical): No  . Lack of Transportation (Non-Medical): No  Physical Activity: Inactive  . Days of Exercise per Week: 0 days  . Minutes of Exercise per Session: 0 min  Stress: No Stress Concern  Present  . Feeling of Stress : Not at all  Social Connections: Socially Isolated  . Frequency of Communication with Friends and Family: More than three times a week  . Frequency of Social Gatherings with Friends and Family: More than three times a week  . Attends Religious Services: Never  . Active Member of Clubs or Organizations: No  . Attends Archivist Meetings: Never  . Marital Status: Widowed    Tobacco Counseling Counseling given: Not Answered Comment: never smoked a full PPD.    Clinical Intake:  Pre-visit preparation completed: Yes  Pain : No/denies pain Pain Score: 0-No pain     BMI - recorded: 33.75 Nutritional Status: BMI > 30  Obese Nutritional Risks: None Diabetes: No  How often do you need to have someone help you when you read instructions, pamphlets, or other written materials from your doctor or pharmacy?: 1 - Never What is the last grade level you completed in school?: Retired Therapist, sports  Diabetic? no  Interpreter Needed?: No  Information entered by :: Lisette Abu, LPN   Activities of Daily Living In your present state of health, do you have any difficulty performing the following activities: 07/05/2020  Hearing? N  Vision? N  Difficulty concentrating or making decisions? N  Walking or climbing stairs? N  Dressing or bathing? N  Doing errands, shopping? N  Preparing Food and eating ? N  Using the Toilet? N  In the past six months, have you accidently leaked urine? N  Do you have problems with loss of bowel control? N  Managing your Medications? N  Managing your Finances? N  Housekeeping or managing your Housekeeping? N  Some recent data might be hidden    Patient Care Team: Janith Lima, MD as PCP - General (Internal Medicine) Renato Shin, MD as Consulting Physician (Endocrinology) Terrance Mass, MD (Inactive) as Consulting Physician (Gynecology) Irene Shipper, MD as Consulting Physician (Gastroenterology) Princess Bruins, MD as Consulting Physician (Obstetrics and Gynecology)  Indicate any recent Medical Services you may have received from other than [facility] providers in the past year (date may be approximate).     Assessment:   This is a routine wellness examination for [name].  Hearing/Vision screen No exam data present  Dietary issues and exercise activities discussed: Current Exercise Habits: The patient does not participate in regular exercise at present, Exercise limited by: respiratory conditions(s);psychological condition(s)  Goals    .  patient (pt-stated)      More stress relief and balance; Getting out and meeting people and enjoying art;    .  Patient Stated      Maintain current health status.    .  Weight (lb) < 160 lb (72.6 kg)      Patient states she would like to lose 40 pounds.       Depression Screen PHQ 2/9 Scores 07/05/2020 01/17/2020 08/02/2019 01/16/2019 09/28/2018 11/03/2017 11/03/2017  PHQ - 2 Score 0 0 2 2 2 4 1   PHQ- 9 Score - 3 3 6 7 10  -    Fall Risk Fall Risk  07/05/2020 01/17/2020 01/16/2019 09/28/2018 11/03/2017  Falls in the past year? 0 0 0 0 No  Comment - - - - -  Number falls in past yr: 0 0 0 0 -  Injury with Fall? 0 0 0 0 -  Risk for fall due to : No Fall Risks No Fall Risks - - -  Follow up Falls evaluation completed Falls evaluation completed Falls evaluation completed Falls evaluation completed -    Any stairs in or around the home? Yes  If so, are there any without handrails? No  Home free of loose throw rugs in walkways, pet beds, electrical cords, etc? Yes  Adequate lighting in your home to reduce risk of falls? Yes   ASSISTIVE DEVICES UTILIZED TO PREVENT FALLS:  Life alert? No  Use of a cane, walker or w/c? No  Grab bars in the bathroom? No  Shower chair or bench in shower? No  Elevated toilet seat or a handicapped toilet? No   TIMED UP AND GO:  Was the test performed? No .  Length of time to ambulate 10 feet: 0 sec.   Gait steady and  fast without use of assistive device  Cognitive Function:     6CIT Screen 07/05/2020  What Year? 0 points  What month? 0 points  What time? 0 points  Count back from 20 0 points  Months in reverse 0 points  Repeat phrase 0 points  Total Score 0    Immunizations Immunization History  Administered Date(s) Administered  . Fluad Quad(high Dose 65+) 05/26/2019, 06/17/2020  . Influenza, High Dose Seasonal PF 07/19/2013, 09/28/2018  . PFIZER SARS-COV-2 Vaccination 12/09/2019, 12/30/2019  . Pneumococcal Conjugate-13 07/19/2013  . Pneumococcal Polysaccharide-23 09/25/2015  . Td 09/15/2011    TDAP status: Up to date Flu Vaccine status: Up to date Pneumococcal vaccine status: Up to date Covid-19 vaccine status: Completed vaccines  Qualifies for Shingles Vaccine? Yes   Zostavax completed No   Shingrix Completed?: No.    Education has been provided regarding the importance of this vaccine. Patient has been advised to call insurance company to determine out of pocket expense if they have not yet received this vaccine. Advised may also receive vaccine at local pharmacy or Health Dept. Verbalized acceptance and understanding.  Screening Tests Health Maintenance  Topic Date Due  . TETANUS/TDAP  09/14/2021  . COLONOSCOPY  03/14/2024  . INFLUENZA VACCINE  Completed  . DEXA SCAN  Completed  . COVID-19 Vaccine  Completed  . Hepatitis C Screening  Completed  . PNA vac Low Risk Adult  Completed    Health Maintenance  There are no preventive care reminders to display for this patient.  Colorectal cancer screening: Completed 03/15/2019. Repeat every 5 years Mammogram status: Completed 01/03/2020. Repeat every year Bone Density status: Completed 12/04/2019. Results reflect: Bone density results: OSTEOPOROSIS. Repeat every 2 years.  Lung Cancer Screening: (Low Dose CT Chest recommended if Age 32-80 years, 30 pack-year currently smoking OR have quit w/in 15years.) does not qualify.   Lung  Cancer Screening Referral: no  Additional Screening:  Hepatitis C Screening: does qualify; Completed yes  Vision Screening: Recommended annual ophthalmology exams for early detection of glaucoma and other disorders of the eye. Is the patient up to date with their annual eye exam?  Yes  Who is the provider or what is the name of the office in which the patient attends annual eye exams? Marygrace Drought, MD If pt is not established with a provider, would they like to be referred to a provider to establish care? No .   Dental Screening: Recommended annual dental exams for proper oral hygiene  Community Resource Referral / Chronic Care Management: CRR required this visit?  No   CCM required this visit?  No      Plan:     I have personally reviewed and noted the following in the patient's chart:   . Medical and social history . Use of alcohol, tobacco or illicit drugs  . Current medications and supplements . Functional ability and status . Nutritional status . Physical activity . Advanced directives . List of other physicians . Hospitalizations, surgeries, and ER visits in previous 12 months . Vitals . Screenings to include cognitive, depression, and falls . Referrals and appointments  In addition, I have reviewed and discussed with patient certain preventive protocols, quality metrics, and best practice recommendations. A written personalized care plan for preventive services as well as general preventive health recommendations were provided to patient.     Sheral Flow, LPN   [id number]   Nurse Notes: n/a

## 2020-07-05 NOTE — Patient Instructions (Signed)
Ms. Valerie Jackson , Thank you for taking time to come for your Medicare Wellness Visit. I appreciate your ongoing commitment to your health goals. Please review the following plan we discussed and let me know if I can assist you in the future.   Screening recommendations/referrals: Colonoscopy: 03/15/2019; due every 5 years Mammogram: 01/03/2020 Bone Density: 12/04/2019; due every 2 years Recommended yearly ophthalmology/optometry visit for glaucoma screening and checkup Recommended yearly dental visit for hygiene and checkup  Vaccinations: Influenza vaccine: 06/17/2020 Pneumococcal vaccine: up to date Tdap vaccine: up to date (due 2023) Shingles vaccine: never done Covid-19: up to date  Advanced directives: Advance directive discussed with you today. Even though you declined this today please call our office should you change your mind and we can give you the proper paperwork for you to fill out.  Conditions/risks identified: Yes; Reviewed health maintenance screenings with patient today and relevant education, vaccines, and/or referrals were provided. Please continue to do your personal lifestyle choices by: daily care of teeth and gums, regular physical activity (goal should be 5 days a week for 30 minutes), eat a healthy diet, avoid tobacco and drug use, limiting any alcohol intake, taking a low-dose aspirin (if not allergic or have been advised by your provider otherwise) and taking vitamins and minerals as recommended by your provider. Continue doing brain stimulating activities (puzzles, reading, adult coloring books, staying active) to keep memory sharp. Continue to eat heart healthy diet (full of fruits, vegetables, whole grains, lean protein, water--limit salt, fat, and sugar intake) and increase physical activity as tolerated.  Next appointment: Please schedule your next Medicare Wellness Visit with your Nurse Health Advisor in 1 year by calling [phone].   Preventive Care 1 Years and  Older, Female Preventive care refers to lifestyle choices and visits with your health care provider that can promote health and wellness. What does preventive care include?  A yearly physical exam. This is also called an annual well check.  Dental exams once or twice a year.  Routine eye exams. Ask your health care provider how often you should have your eyes checked.  Personal lifestyle choices, including:  Daily care of your teeth and gums.  Regular physical activity.  Eating a healthy diet.  Avoiding tobacco and drug use.  Limiting alcohol use.  Practicing safe sex.  Taking low-dose aspirin every day.  Taking vitamin and mineral supplements as recommended by your health care provider. What happens during an annual well check? The services and screenings done by your health care provider during your annual well check will depend on your age, overall health, lifestyle risk factors, and family history of disease. Counseling  Your health care provider may ask you questions about your:  Alcohol use.  Tobacco use.  Drug use.  Emotional well-being.  Home and relationship well-being.  Sexual activity.  Eating habits.  History of falls.  Memory and ability to understand (cognition).  Work and work Statistician.  Reproductive health. Screening  You may have the following tests or measurements:  Height, weight, and BMI.  Blood pressure.  Lipid and cholesterol levels. These may be checked every 5 years, or more frequently if you are over 91 years old.  Skin check.  Lung cancer screening. You may have this screening every year starting at age 36 if you have a 30-pack-year history of smoking and currently smoke or have quit within the past 15 years.  Fecal occult blood test (FOBT) of the stool. You may have this test every year  starting at age 59.  Flexible sigmoidoscopy or colonoscopy. You may have a sigmoidoscopy every 5 years or a colonoscopy every 10 years  starting at age 9.  Hepatitis C blood test.  Hepatitis B blood test.  Sexually transmitted disease (STD) testing.  Diabetes screening. This is done by checking your blood sugar (glucose) after you have not eaten for a while (fasting). You may have this done every 1-3 years.  Bone density scan. This is done to screen for osteoporosis. You may have this done starting at age 61.  Mammogram. This may be done every 1-2 years. Talk to your health care provider about how often you should have regular mammograms. Talk with your health care provider about your test results, treatment options, and if necessary, the need for more tests. Vaccines  Your health care provider may recommend certain vaccines, such as:  Influenza vaccine. This is recommended every year.  Tetanus, diphtheria, and acellular pertussis (Tdap, Td) vaccine. You may need a Td booster every 10 years.  Zoster vaccine. You may need this after age 30.  Pneumococcal 13-valent conjugate (PCV13) vaccine. One dose is recommended after age 60.  Pneumococcal polysaccharide (PPSV23) vaccine. One dose is recommended after age 30. Talk to your health care provider about which screenings and vaccines you need and how often you need them. This information is not intended to replace advice given to you by your health care provider. Make sure you discuss any questions you have with your health care provider. Document Released: 09/27/2015 Document Revised: 05/20/2016 Document Reviewed: 07/02/2015 Elsevier Interactive Patient Education  2017 [place] Prevention in the Home Falls can cause injuries. They can happen to people of all ages. There are many things you can do to make your home safe and to help prevent falls. What can I do on the outside of my home?  Regularly fix the edges of walkways and driveways and fix any cracks.  Remove anything that might make you trip as you walk through a door, such as a raised step or  threshold.  Trim any bushes or trees on the path to your home.  Use bright outdoor lighting.  Clear any walking paths of anything that might make someone trip, such as rocks or tools.  Regularly check to see if handrails are loose or broken. Make sure that both sides of any steps have handrails.  Any raised decks and porches should have guardrails on the edges.  Have any leaves, snow, or ice cleared regularly.  Use sand or salt on walking paths during winter.  Clean up any spills in your garage right away. This includes oil or grease spills. What can I do in the bathroom?  Use night lights.  Install grab bars by the toilet and in the tub and shower. Do not use towel bars as grab bars.  Use non-skid mats or decals in the tub or shower.  If you need to sit down in the shower, use a plastic, non-slip stool.  Keep the floor dry. Clean up any water that spills on the floor as soon as it happens.  Remove soap buildup in the tub or shower regularly.  Attach bath mats securely with double-sided non-slip rug tape.  Do not have throw rugs and other things on the floor that can make you trip. What can I do in the bedroom?  Use night lights.  Make sure that you have a light by your bed that is easy to reach.  Do not  use any sheets or blankets that are too big for your bed. They should not hang down onto the floor.  Have a firm chair that has side arms. You can use this for support while you get dressed.  Do not have throw rugs and other things on the floor that can make you trip. What can I do in the kitchen?  Clean up any spills right away.  Avoid walking on wet floors.  Keep items that you use a lot in easy-to-reach places.  If you need to reach something above you, use a strong step stool that has a grab bar.  Keep electrical cords out of the way.  Do not use floor polish or wax that makes floors slippery. If you must use wax, use non-skid floor wax.  Do not have  throw rugs and other things on the floor that can make you trip. What can I do with my stairs?  Do not leave any items on the stairs.  Make sure that there are handrails on both sides of the stairs and use them. Fix handrails that are broken or loose. Make sure that handrails are as long as the stairways.  Check any carpeting to make sure that it is firmly attached to the stairs. Fix any carpet that is loose or worn.  Avoid having throw rugs at the top or bottom of the stairs. If you do have throw rugs, attach them to the floor with carpet tape.  Make sure that you have a light switch at the top of the stairs and the bottom of the stairs. If you do not have them, ask someone to add them for you. What else can I do to help prevent falls?  Wear shoes that:  Do not have high heels.  Have rubber bottoms.  Are comfortable and fit you well.  Are closed at the toe. Do not wear sandals.  If you use a stepladder:  Make sure that it is fully opened. Do not climb a closed stepladder.  Make sure that both sides of the stepladder are locked into place.  Ask someone to hold it for you, if possible.  Clearly mark and make sure that you can see:  Any grab bars or handrails.  First and last steps.  Where the edge of each step is.  Use tools that help you move around (mobility aids) if they are needed. These include:  Canes.  Walkers.  Scooters.  Crutches.  Turn on the lights when you go into a dark area. Replace any light bulbs as soon as they burn out.  Set up your furniture so you have a clear path. Avoid moving your furniture around.  If any of your floors are uneven, fix them.  If there are any pets around you, be aware of where they are.  Review your medicines with your doctor. Some medicines can make you feel dizzy. This can increase your chance of falling. Ask your doctor what other things that you can do to help prevent falls. This information is not intended to  replace advice given to you by your health care provider. Make sure you discuss any questions you have with your health care provider. Document Released: 06/27/2009 Document Revised: 02/06/2016 Document Reviewed: 10/05/2014 Elsevier Interactive Patient Education  2017 [employer].

## 2020-07-12 ENCOUNTER — Telehealth: Payer: Self-pay | Admitting: Internal Medicine

## 2020-07-12 ENCOUNTER — Other Ambulatory Visit: Payer: Self-pay | Admitting: Internal Medicine

## 2020-07-12 DIAGNOSIS — F418 Other specified anxiety disorders: Secondary | ICD-10-CM

## 2020-07-12 MED ORDER — ALPRAZOLAM 0.5 MG PO TABS: 0.5000 mg | ORAL_TABLET | Freq: Every evening | ORAL | 5 refills | Status: DC | PRN

## 2020-07-12 NOTE — Telephone Encounter (Signed)
ALPRAZolam (XANAX) 0.5 MG tablet Patients states her prescription has expired and was wondering if she could get a refill  [facility] # [place], [place] Phone:  [phone]  Fax:  [phone]

## 2020-07-15 ENCOUNTER — Other Ambulatory Visit: Payer: Self-pay | Admitting: Internal Medicine

## 2020-07-15 DIAGNOSIS — E039 Hypothyroidism, unspecified: Secondary | ICD-10-CM

## 2020-07-23 ENCOUNTER — Other Ambulatory Visit: Payer: Self-pay | Admitting: Internal Medicine

## 2020-08-15 DIAGNOSIS — Z23 Encounter for immunization: Secondary | ICD-10-CM | POA: Diagnosis not present

## 2020-09-02 ENCOUNTER — Other Ambulatory Visit: Payer: Self-pay | Admitting: Internal Medicine

## 2020-09-16 ENCOUNTER — Ambulatory Visit (HOSPITAL_COMMUNITY)
Admission: EM | Admit: 2020-09-16 | Discharge: 2020-09-16 | Disposition: A | Discharge status: Home / Self Care | Payer: Medicare Other

## 2020-09-16 ENCOUNTER — Telehealth (INDEPENDENT_AMBULATORY_CARE_PROVIDER_SITE_OTHER): Payer: Medicare Other | Admitting: Family Medicine

## 2020-09-16 ENCOUNTER — Other Ambulatory Visit: Payer: Self-pay

## 2020-09-16 ENCOUNTER — Encounter (HOSPITAL_COMMUNITY): Payer: Self-pay | Admitting: Emergency Medicine

## 2020-09-16 DIAGNOSIS — M7989 Other specified soft tissue disorders: Secondary | ICD-10-CM

## 2020-09-16 DIAGNOSIS — R059 Cough, unspecified: Secondary | ICD-10-CM | POA: Diagnosis not present

## 2020-09-16 DIAGNOSIS — R0602 Shortness of breath: Secondary | ICD-10-CM

## 2020-09-16 NOTE — Progress Notes (Signed)
Virtual Visit via Video Note  I connected with [name]  on 09/16/20 at  3:20 PM EST by a video enabled telemedicine application and verified that I am speaking with the correct person using two identifiers.  Location patient: home, [place] Location provider:work or home office Persons participating in the virtual visit: patient, provider  I discussed the limitations of evaluation and management by telemedicine and the availability of in person appointments. The patient expressed understanding and agreed to proceed.   HPI:  Acute telemedicine visit for several symptoms: -Onset: yesterday morning -Symptoms include: cough, mild headache, R leg feels heavy and she noticed some swelling in the R leg that she reports she has never had before, some SOB - reports has a history of this - but feels more sob than usual -Denies: fevers, NVD, nasal congestion, loss of taste a little - but chronic for her, inability to eat/drink/get out of bed, sick contacts -Has tried:nothing -Pertinent past medical history: asthma, emphysema, ckd, obesity, hypothyroidism and more -Pertinent medication allergies: Crestor and estrogens  -COVID-19 vaccine status: has had vaccines and booster and had flu shot  ROS: See pertinent positives and negatives per HPI.  Past Medical History:  Diagnosis Date  . Arthritis    fingers  . Blood clot in vein 1968  . Cataracts, bilateral   . CHF (congestive heart failure) ([place])   . Chronic kidney disease    CKD stage 2  . COPD (chronic obstructive pulmonary disease) ([place])   . Depression   . Diverticulitis 09/2013   with perforation  . Dizziness    with decreased BP readings or bending over  . Floaters   . GERD (gastroesophageal reflux disease)   . H/O hiatal hernia   . Headache(784.0)    migraine every 5- to 6 years  . Heart murmur since age 34   mild  . Hepatitis age 19 or 2   had heapatitis, not sure which kind  . History of blood transfusion 1965  . History of  frequent urinary tract infections   . Hypercholesteremia   . Hypertension   . Hypothyroid   . Obesity   . Pericolonic abscess 2015  . Pneumonia   . Sickle cell trait [facility])     Past Surgical History:  Procedure Laterality Date  . CHOLECYSTECTOMY  2002  . COLON RESECTION N/A 10/11/2013   Procedure: EXPLORATORY LAPAROTOMY WITH RECTOSIGMOID COLECTOMY AND END COLOSTOMY;  Surgeon: Stark Klein, MD;  Location: [facility];  Service: General;  Laterality: N/A;  . COLOSTOMY TAKEDOWN N/A 02/27/2014   Procedure: COLOSTOMY TAKEDOWN [name] HERNIA REPAIR;  Surgeon: Stark Klein, MD;  Location: [facility];  Service: General;  Laterality: N/A;  . INCISIONAL HERNIA REPAIR N/A 03/04/2016   Procedure: LAPAROSCOPIC INCISIONAL HERNIA WITH MESH;  Surgeon: Ralene Ok, MD;  Location: [facility];  Service: General;  Laterality: N/A;  . LAPAROSCOPIC LYSIS OF ADHESIONS N/A 03/04/2016   Procedure: LAPAROSCOPIC LYSIS OF ADHESIONS;  Surgeon: Ralene Ok, MD;  Location: [facility];  Service: General;  Laterality: N/A;  . RIGHT HEART CATH N/A 06/24/2017   Procedure: RIGHT HEART CATH;  Surgeon: Jolaine Artist, MD;  Location: [place] [facility];  Service: Cardiovascular;  Laterality: N/A;  . TONSILLECTOMY  1952     Current Outpatient Medications:  .  acetaminophen (TYLENOL) 500 MG tablet, Take 1 tablet (500 mg total) by mouth every 6 (six) hours as needed., Disp: 30 tablet, Rfl: 0 .  allopurinol (ZYLOPRIM) 100 MG tablet, Take 1 tablet (100 mg  total) by mouth daily., Disp: 90 tablet, Rfl: 1 .  ALPRAZolam (XANAX) 0.5 MG tablet, Take 1 tablet (0.5 mg total) by mouth at bedtime as needed for anxiety., Disp: 30 tablet, Rfl: 5 .  amLODipine (NORVASC) 5 MG tablet, Take 1 tablet (5 mg total) by mouth daily., Disp: 90 tablet, Rfl: 1 .  Calcium Carbonate-Vit D-Min (CALCIUM 1200 PO), Take 1 tablet by mouth daily., Disp: , Rfl:  .  cholecalciferol (VITAMIN D) 1000 UNITS tablet, Take 2,000 Units by mouth daily. , Disp: , Rfl:  .   colchicine 0.6 MG tablet, TAKE ONE TABLET BY MOUTH ONE TIME DAILY, Disp: 30 tablet, Rfl: 0 .  colestipol (COLESTID) 1 g tablet, Take 1 tablet (1 g total) by mouth 2 (two) times daily., Disp: 60 tablet, Rfl: 3 .  cyanocobalamin (,VITAMIN B-12,) 1000 MCG/ML injection, Inject 1 mL (1,000 mcg total) into the muscle every 30 (thirty) days., Disp: 4 mL, Rfl: 3 .  Fluticasone Furoate (ARNUITY ELLIPTA) 100 MCG/ACT AEPB, Inhale 1 puff into the lungs daily., Disp: 90 each, Rfl: 3 .  levothyroxine (SYNTHROID) 150 MCG tablet, Take 1 tablet (150 mcg total) by mouth daily before breakfast., Disp: 90 tablet, Rfl: 0 .  methocarbamol (ROBAXIN) 500 MG tablet, Take 1 tablet (500 mg total) by mouth every 8 (eight) hours as needed for muscle spasms., Disp: 60 tablet, Rfl: 3 .  potassium chloride SA (KLOR-CON) 20 MEQ tablet, TAKE 1 TABLET(20 MEQ) BY MOUTH TWICE DAILY, Disp: 180 tablet, Rfl: 0 .  rizatriptan (MAXALT-MLT) 10 MG disintegrating tablet, Take 1 tablet (10 mg total) by mouth as needed for migraine. May repeat in 2 hours if needed, Disp: 30 tablet, Rfl: 1 .  SYRINGE-NEEDLE, DISP, 3 ML 25G X 5/8" 3 ML MISC, 1 each by Does not apply route every 30 (thirty) days., Disp: 50 each, Rfl: 0 .  tiZANidine (ZANAFLEX) 4 MG tablet, Take 1 tablet (4 mg total) by mouth every 6 (six) hours as needed for muscle spasms., Disp: 30 tablet, Rfl: 0  EXAM:  VITALS per patient if applicable:  GENERAL: alert, oriented, appears well and in no acute distress  HEENT: atraumatic, conjunttiva clear, no obvious abnormalities on inspection of external nose and ears  NECK: normal movements of the head and neck  LUNGS: on inspection no signs of respiratory distress, breathing rate appears normal, no obvious gross SOB, gasping or wheezing  CV: no obvious cyanosis, she reports pitting edema in one leg only over tibia  MS: moves all visible extremities without noticeable abnormality  PSYCH/NEURO: pleasant and cooperative, no obvious  depression or anxiety, speech and thought processing grossly intact  ASSESSMENT AND PLAN:  Discussed the following assessment and plan:  Shortness of breath  Leg swelling  Cough  -we discussed possible serious and likely etiologies, options for evaluation and workup, limitations of telemedicine visit vs in person visit, treatment, treatment risks and precautions. Pt prefers to treat via telemedicine empirically rather than in person at this moment.  I do not like that she is reporting acute onset of unilateral leg swelling along with shortness of breath.  Advised higher level of care for in person evaluation.  She was in agreement after discussion of potential etiologies and possible evaluation needed to rule out more serious pathologies.  Discussed options for care.  She and her daughter plan to seek out a urgent care today for evaluation. Advised to seek prompt in person care if worsening, new symptoms arise, or if is not improving with treatment.  Discussed options for inperson care if PCP office not available. Did let this patient know that I only do telemedicine on Tuesdays and Thursdays for [place]. Advised to schedule follow up visit with PCP or [facility] if any further questions or concerns to avoid delays in care.   I discussed the assessment and treatment plan with the patient. The patient was provided an opportunity to ask questions and all were answered. The patient agreed with the plan and demonstrated an understanding of the instructions.  Spent around 18 minutes for review of history, medications, allergies, evaluation and counseling this patient.     Lucretia Kern, DO

## 2020-09-16 NOTE — ED Triage Notes (Signed)
Pt states that she has a cough, SOB, lower extremity edema, and HA. Pt states that sx started yesterday morning.

## 2020-09-16 NOTE — ED Notes (Signed)
Pt advised to go to ED via POV by Dr. Meda Coffee due to SOB and low extremity edema. Pt understood plan of care. Pt stated that she is going home to take BP medication and monitor BP although she was advised to go the ED.

## 2020-09-16 NOTE — ED Provider Notes (Signed)
BP (!) 167/120 (BP Location: Left Arm)   Pulse 82   Temp 98.6 F (37 C) (Oral)   Resp 18   SpO2 95%   Symptomatic hypertension with headache, SOB and pedal edema.  Sent to the ER for higher level of care  Albin Felling, MD 09/16/20 [phone]

## 2020-10-01 ENCOUNTER — Other Ambulatory Visit: Payer: Self-pay | Admitting: Internal Medicine

## 2020-10-10 ENCOUNTER — Other Ambulatory Visit: Payer: Self-pay | Admitting: Internal Medicine

## 2020-10-10 DIAGNOSIS — E039 Hypothyroidism, unspecified: Secondary | ICD-10-CM

## 2020-11-02 ENCOUNTER — Other Ambulatory Visit: Payer: Self-pay | Admitting: Internal Medicine

## 2020-11-08 DIAGNOSIS — N183 Chronic kidney disease, stage 3 unspecified: Secondary | ICD-10-CM | POA: Diagnosis not present

## 2020-11-12 DIAGNOSIS — I129 Hypertensive chronic kidney disease with stage 1 through stage 4 chronic kidney disease, or unspecified chronic kidney disease: Secondary | ICD-10-CM | POA: Diagnosis not present

## 2020-11-12 DIAGNOSIS — N1832 Chronic kidney disease, stage 3b: Secondary | ICD-10-CM | POA: Diagnosis not present

## 2020-11-12 DIAGNOSIS — N2581 Secondary hyperparathyroidism of renal origin: Secondary | ICD-10-CM | POA: Diagnosis not present

## 2020-11-12 DIAGNOSIS — D631 Anemia in chronic kidney disease: Secondary | ICD-10-CM | POA: Diagnosis not present

## 2020-11-20 ENCOUNTER — Other Ambulatory Visit: Payer: Self-pay

## 2020-11-20 ENCOUNTER — Ambulatory Visit (INDEPENDENT_AMBULATORY_CARE_PROVIDER_SITE_OTHER): Payer: Medicare Other | Admitting: Endocrinology

## 2020-11-20 VITALS — BP 132/86 | HR 63 | Ht 65.0 in | Wt 204.6 lb

## 2020-11-20 DIAGNOSIS — R0600 Dyspnea, unspecified: Secondary | ICD-10-CM | POA: Diagnosis not present

## 2020-11-20 DIAGNOSIS — E039 Hypothyroidism, unspecified: Secondary | ICD-10-CM

## 2020-11-20 DIAGNOSIS — M8580 Other specified disorders of bone density and structure, unspecified site: Secondary | ICD-10-CM

## 2020-11-20 DIAGNOSIS — E559 Vitamin D deficiency, unspecified: Secondary | ICD-10-CM | POA: Insufficient documentation

## 2020-11-20 DIAGNOSIS — R0609 Other forms of dyspnea: Secondary | ICD-10-CM

## 2020-11-20 LAB — BASIC METABOLIC PANEL
BUN: 15 mg/dL (ref 6–23)
CO2: 29 mEq/L (ref 19–32)
Calcium: 10.3 mg/dL (ref 8.4–10.5)
Chloride: 103 mEq/L (ref 96–112)
Creatinine, Ser: 1.8 mg/dL — ABNORMAL HIGH (ref 0.40–1.20)
GFR: 27.18 mL/min — ABNORMAL LOW (ref >60.00)
Glucose, Bld: 77 mg/dL (ref 70–99)
Potassium: 3.8 mEq/L (ref 3.5–5.1)
Sodium: 140 mEq/L (ref 135–145)

## 2020-11-20 NOTE — Patient Instructions (Addendum)
Blood tests are requested for you today.  We'll let you know about the results.  Please come back for a follow-up appointment in 1 year.

## 2020-11-20 NOTE — Progress Notes (Signed)
Subjective:    Patient ID: Valerie Jackson, female    DOB: [birth year], 75 y.o.   MRN: [id number]  HPI Pt returns for f/u of hypercalcemia (dx'ed early 2017 (it was normal in 2016); chlorthalidone was continued, as hypercalcemia was mild; no other cause was found; it normalized with vit-D supplementation).   She also has mild hyperparathyroidism (renal insufficiency could contribute; she did not meet criteria for surgery; she has never had urolithiasis; level returned to normal with vit-D supplementation).   Pt returns for f/u of osteoporosis:  Dx'ed: 2016 Secondary cause: mild elev PTH, but this is resolved.   Fractures: left 5th toe (1970), with an injury.  Past rx: none Current rx: none Last DEXA result (3/21) worst T-score was -1.3 (LFN) Other: pt states she feels well in general. Interval hx: she denies edema  For HTN: She takes aldactone now.   For Vit-D deficiency: she takes OTC supplement, as rx'ed.   Main symptom is leg swelling.   Past Medical History:  Diagnosis Date  . Arthritis    fingers  . Blood clot in vein 1968  . Cataracts, bilateral   . CHF (congestive heart failure) ([place])   . Chronic kidney disease    CKD stage 2  . COPD (chronic obstructive pulmonary disease) ([place])   . Depression   . Diverticulitis 09/2013   with perforation  . Dizziness    with decreased BP readings or bending over  . Floaters   . GERD (gastroesophageal reflux disease)   . H/O hiatal hernia   . Headache(784.0)    migraine every 5- to 6 years  . Heart murmur since age 19   mild  . Hepatitis age 24 or 18   had heapatitis, not sure which kind  . History of blood transfusion 1965  . History of frequent urinary tract infections   . Hypercholesteremia   . Hypertension   . Hypothyroid   . Obesity   . Pericolonic abscess 2015  . Pneumonia   . Sickle cell trait [facility])     Past Surgical History:  Procedure Laterality Date  . CHOLECYSTECTOMY  2002  . COLON RESECTION N/A 10/11/2013    Procedure: EXPLORATORY LAPAROTOMY WITH RECTOSIGMOID COLECTOMY AND END COLOSTOMY;  Surgeon: Stark Klein, MD;  Location: [facility];  Service: General;  Laterality: N/A;  . COLOSTOMY TAKEDOWN N/A 02/27/2014   Procedure: COLOSTOMY TAKEDOWN [name] HERNIA REPAIR;  Surgeon: Stark Klein, MD;  Location: [facility];  Service: General;  Laterality: N/A;  . INCISIONAL HERNIA REPAIR N/A 03/04/2016   Procedure: LAPAROSCOPIC INCISIONAL HERNIA WITH MESH;  Surgeon: Ralene Ok, MD;  [facility];  Service: General;  Laterality: N/A;  . LAPAROSCOPIC LYSIS OF ADHESIONS N/A 03/04/2016   Procedure: LAPAROSCOPIC LYSIS OF ADHESIONS;  Surgeon: Ralene Ok, MD;  [facility];  Service: General;  Laterality: N/A;  . RIGHT HEART CATH N/A 06/24/2017   Procedure: RIGHT HEART CATH;  Surgeon: Jolaine Artist, MD;  Location: [place] [facility];  Service: Cardiovascular;  Laterality: N/A;  . TONSILLECTOMY  1952    Social History   Socioeconomic History  . Marital status: Divorced    Spouse name: Not on file  . Number of children: 1  . Years of education: 11  . Highest education level: Not on file  Occupational History  . Occupation: Retired    Comment: Therapist, sports  Tobacco Use  . Smoking status: Former Smoker    Packs/day: 0.50    Years: 52.00  Pack years: 26.00    Types: Cigarettes    Quit date: 09/06/2012    Years since quitting: 8.2  . Smokeless tobacco: Never Used  . Tobacco comment: never smoked a full PPD.   Vaping Use  . Vaping Use: Never used  Substance and Sexual Activity  . Alcohol use: No    Alcohol/week: 0.0 standard drinks    Comment: very rarely  . Drug use: No  . Sexual activity: Not Currently  Other Topics Concern  . Not on file  Social History Narrative   Regular exercise-no   Caffeine Use-yes   Social Determinants of Health   Financial Resource Strain: Low Risk   . Difficulty of Paying Living Expenses: Not hard at all  Food Insecurity: No Food Insecurity  . Worried  About Charity fundraiser in the Last Year: Never true  . Ran Out of Food in the Last Year: Never true  Transportation Needs: No Transportation Needs  . Lack of Transportation (Medical): No  . Lack of Transportation (Non-Medical): No  Physical Activity: Inactive  . Days of Exercise per Week: 0 days  . Minutes of Exercise per Session: 0 min  Stress: No Stress Concern Present  . Feeling of Stress : Not at all  Social Connections: Socially Isolated  . Frequency of Communication with Friends and Family: More than three times a week  . Frequency of Social Gatherings with Friends and Family: More than three times a week  . Attends Religious Services: Never  . Active Member of Clubs or Organizations: No  . Attends Archivist Meetings: Never  . Marital Status: Widowed  Intimate Partner Violence: Not on file    Current Outpatient Medications on File Prior to Visit  Medication Sig Dispense Refill  . acetaminophen (TYLENOL) 500 MG tablet Take 1 tablet (500 mg total) by mouth every 6 (six) hours as needed. 30 tablet 0  . allopurinol (ZYLOPRIM) 100 MG tablet Take 1 tablet (100 mg total) by mouth daily. 90 tablet 1  . ALPRAZolam (XANAX) 0.5 MG tablet Take 1 tablet (0.5 mg total) by mouth at bedtime as needed for anxiety. 30 tablet 5  . amLODipine (NORVASC) 5 MG tablet Take 1 tablet (5 mg total) by mouth daily. 90 tablet 1  . Calcium Carbonate-Vit D-Min (CALCIUM 1200 PO) Take 1 tablet by mouth daily.    . cholecalciferol (VITAMIN D) 1000 UNITS tablet Take 2,000 Units by mouth daily.     . colchicine 0.6 MG tablet TAKE ONE TABLET BY MOUTH ONE TIME DAILY 30 tablet 0  . colestipol (COLESTID) 1 g tablet Take 1 tablet (1 g total) by mouth 2 (two) times daily. 60 tablet 3  . cyanocobalamin (,VITAMIN B-12,) 1000 MCG/ML injection Inject 1 mL (1,000 mcg total) into the muscle every 30 (thirty) days. 4 mL 3  . Fluticasone Furoate (ARNUITY ELLIPTA) 100 MCG/ACT AEPB Inhale 1 puff into the lungs daily.  90 each 3  . methocarbamol (ROBAXIN) 500 MG tablet Take 1 tablet (500 mg total) by mouth every 8 (eight) hours as needed for muscle spasms. 60 tablet 3  . potassium chloride SA (KLOR-CON) 20 MEQ tablet TAKE 1 TABLET(20 MEQ) BY MOUTH TWICE DAILY 180 tablet 0  . rizatriptan (MAXALT-MLT) 10 MG disintegrating tablet Take 1 tablet (10 mg total) by mouth as needed for migraine. May repeat in 2 hours if needed 30 tablet 1  . SYRINGE-NEEDLE, DISP, 3 ML 25G X 5/8" 3 ML MISC 1 each by Does not apply  route every 30 (thirty) days. 50 each 0  . tiZANidine (ZANAFLEX) 4 MG tablet Take 1 tablet (4 mg total) by mouth every 6 (six) hours as needed for muscle spasms. 30 tablet 0   No current facility-administered medications on file prior to visit.    Allergies  Allergen Reactions  . Crestor [Rosuvastatin] Other (See Comments)    Myalgias   . Estrogens     Blood clot in leg    Family History  Problem Relation Age of Onset  . Hypertension Mother   . Aneurysm Mother        Cerebral (died from this)  . Cancer Mother        Uterine  . Cancer Other   . Arthritis Sister   . Hyperlipidemia Sister   . Hypertension Sister   . Stroke Sister   . Kidney disease Sister        Cancer  . Hyperlipidemia Brother   . Glaucoma Brother   . Thyroid cancer Daughter   . Breast cancer Maternal Grandmother 78  . Hypertension Maternal Grandmother   . Hypercalcemia Neg Hx     BP 132/86 (BP Location: Right Arm, Patient Position: Sitting, Cuff Size: Large)   Pulse 63   Ht 5' 5"  (1.651 m)   Wt 204 lb 9.6 oz (92.8 kg)   SpO2 97%   BMI 34.05 kg/m     Review of Systems Denies falls.  She has mild doe    Objective:   Physical Exam VITAL SIGNS:  See vs page GENERAL: no distress NECK: There is no palpable thyroid enlargement.  No thyroid nodule is palpable.  No palpable lymphadenopathy at the anterior neck. Ext: trace bilat leg edema  Lab Results  Component Value Date   TSH 5.60 (H) 11/20/2020   T3TOTAL  <10.0 (L) 02/27/2013        Assessment & Plan:  Hypothyroidism: uncontrolled.  I have sent a prescription to your pharmacy, to increase synthroid.  Vit-D def: recheck today.   Patient Instructions  Blood tests are requested for you today.  We'll let you know about the results.  Please come back for a follow-up appointment in 1 year.

## 2020-11-21 LAB — PTH, INTACT AND CALCIUM
Calcium: 10.1 mg/dL (ref 8.6–10.4)
PTH: 47 pg/mL (ref 14–64)

## 2020-11-21 LAB — TSH: TSH: 5.6 u[IU]/mL — ABNORMAL HIGH (ref 0.35–4.50)

## 2020-11-21 LAB — VITAMIN D 25 HYDROXY (VIT D DEFICIENCY, FRACTURES): VITD: 33.38 ng/mL (ref 30.00–100.00)

## 2020-11-21 LAB — T4, FREE: Free T4: 0.58 ng/dL — ABNORMAL LOW (ref 0.60–1.60)

## 2020-11-21 LAB — BRAIN NATRIURETIC PEPTIDE: Pro B Natriuretic peptide (BNP): 20 pg/mL (ref 0.0–100.0)

## 2020-11-21 MED ORDER — LEVOTHYROXINE SODIUM 175 MCG PO TABS: 175.0000 ug | ORAL_TABLET | Freq: Every day | ORAL | 3 refills | Status: DC

## 2020-12-06 ENCOUNTER — Other Ambulatory Visit: Payer: Self-pay | Admitting: Internal Medicine

## 2021-01-22 ENCOUNTER — Other Ambulatory Visit: Payer: Self-pay | Admitting: Internal Medicine

## 2021-02-05 ENCOUNTER — Emergency Department (HOSPITAL_COMMUNITY)
Admission: EM | Admit: 2021-02-05 | Discharge: 2021-02-06 | Disposition: A | Discharge status: Home / Self Care | Payer: Medicare Other | Attending: Emergency Medicine | Admitting: Emergency Medicine

## 2021-02-05 ENCOUNTER — Other Ambulatory Visit: Payer: Self-pay

## 2021-02-05 ENCOUNTER — Encounter (HOSPITAL_COMMUNITY): Payer: Self-pay

## 2021-02-05 DIAGNOSIS — R109 Unspecified abdominal pain: Secondary | ICD-10-CM | POA: Diagnosis present

## 2021-02-05 DIAGNOSIS — I130 Hypertensive heart and chronic kidney disease with heart failure and stage 1 through stage 4 chronic kidney disease, or unspecified chronic kidney disease: Secondary | ICD-10-CM | POA: Insufficient documentation

## 2021-02-05 DIAGNOSIS — K8689 Other specified diseases of pancreas: Secondary | ICD-10-CM | POA: Diagnosis not present

## 2021-02-05 DIAGNOSIS — K219 Gastro-esophageal reflux disease without esophagitis: Secondary | ICD-10-CM | POA: Diagnosis not present

## 2021-02-05 DIAGNOSIS — K449 Diaphragmatic hernia without obstruction or gangrene: Secondary | ICD-10-CM | POA: Diagnosis not present

## 2021-02-05 DIAGNOSIS — N182 Chronic kidney disease, stage 2 (mild): Secondary | ICD-10-CM | POA: Insufficient documentation

## 2021-02-05 DIAGNOSIS — Z87891 Personal history of nicotine dependence: Secondary | ICD-10-CM | POA: Diagnosis not present

## 2021-02-05 DIAGNOSIS — I509 Heart failure, unspecified: Secondary | ICD-10-CM | POA: Insufficient documentation

## 2021-02-05 DIAGNOSIS — N12 Tubulo-interstitial nephritis, not specified as acute or chronic: Secondary | ICD-10-CM | POA: Diagnosis not present

## 2021-02-05 DIAGNOSIS — N189 Chronic kidney disease, unspecified: Secondary | ICD-10-CM | POA: Diagnosis not present

## 2021-02-05 DIAGNOSIS — J449 Chronic obstructive pulmonary disease, unspecified: Secondary | ICD-10-CM | POA: Diagnosis not present

## 2021-02-05 DIAGNOSIS — M47819 Spondylosis without myelopathy or radiculopathy, site unspecified: Secondary | ICD-10-CM | POA: Diagnosis not present

## 2021-02-05 DIAGNOSIS — E039 Hypothyroidism, unspecified: Secondary | ICD-10-CM | POA: Diagnosis not present

## 2021-02-05 MED ORDER — ONDANSETRON 4 MG PO TBDP: 4.0000 mg | ORAL_TABLET | Freq: Once | ORAL | Status: DC | PRN

## 2021-02-05 NOTE — ED Triage Notes (Signed)
Arrived POV. C/o N/V and back pain that started today that is on the right. Pt did have Valium at 1500. Pt states this back pain is different than her chronic back pain that she experiences.

## 2021-02-06 ENCOUNTER — Emergency Department (HOSPITAL_COMMUNITY): Payer: Medicare Other

## 2021-02-06 DIAGNOSIS — N12 Tubulo-interstitial nephritis, not specified as acute or chronic: Secondary | ICD-10-CM | POA: Diagnosis not present

## 2021-02-06 DIAGNOSIS — K449 Diaphragmatic hernia without obstruction or gangrene: Secondary | ICD-10-CM | POA: Diagnosis not present

## 2021-02-06 DIAGNOSIS — M47819 Spondylosis without myelopathy or radiculopathy, site unspecified: Secondary | ICD-10-CM | POA: Diagnosis not present

## 2021-02-06 DIAGNOSIS — K8689 Other specified diseases of pancreas: Secondary | ICD-10-CM | POA: Diagnosis not present

## 2021-02-06 DIAGNOSIS — N189 Chronic kidney disease, unspecified: Secondary | ICD-10-CM | POA: Diagnosis not present

## 2021-02-06 LAB — COMPREHENSIVE METABOLIC PANEL
ALT: 44 U/L (ref 0–44)
AST: 32 U/L (ref 15–41)
Albumin: 4 g/dL (ref 3.5–5.0)
Alkaline Phosphatase: 75 U/L (ref 38–126)
Anion gap: 9 (ref 5–15)
BUN: 10 mg/dL (ref 8–23)
CO2: 25 mmol/L (ref 22–32)
Calcium: 9.6 mg/dL (ref 8.9–10.3)
Chloride: 105 mmol/L (ref 98–111)
Creatinine, Ser: 1.61 mg/dL — ABNORMAL HIGH (ref 0.44–1.00)
GFR, Estimated: 33 mL/min — ABNORMAL LOW (ref >60)
Glucose, Bld: 128 mg/dL — ABNORMAL HIGH (ref 70–99)
Potassium: 3.5 mmol/L (ref 3.5–5.1)
Sodium: 139 mmol/L (ref 135–145)
Total Bilirubin: 1.3 mg/dL — ABNORMAL HIGH (ref 0.3–1.2)
Total Protein: 7.4 g/dL (ref 6.5–8.1)

## 2021-02-06 LAB — URINALYSIS, ROUTINE W REFLEX MICROSCOPIC
Bilirubin Urine: NEGATIVE
Glucose, UA: NEGATIVE mg/dL
Hgb urine dipstick: NEGATIVE
Ketones, ur: NEGATIVE mg/dL
Nitrite: NEGATIVE
Protein, ur: 100 mg/dL — AB
Specific Gravity, Urine: 1.011 (ref 1.005–1.030)
WBC, UA: >50 WBC/hpf — ABNORMAL HIGH (ref 0–5)
pH: 7 (ref 5.0–8.0)

## 2021-02-06 LAB — CBC
HCT: 39.1 % (ref 36.0–46.0)
Hemoglobin: 13.2 g/dL (ref 12.0–15.0)
MCH: 30.8 pg (ref 26.0–34.0)
MCHC: 33.8 g/dL (ref 30.0–36.0)
MCV: 91.4 fL (ref 80.0–100.0)
Platelets: 227 10*3/uL (ref 150–400)
RBC: 4.28 MIL/uL (ref 3.87–5.11)
RDW: 12.4 % (ref 11.5–15.5)
WBC: 8.8 10*3/uL (ref 4.0–10.5)
nRBC: 0 % (ref 0.0–0.2)

## 2021-02-06 LAB — LIPASE, BLOOD: Lipase: 21 U/L (ref 11–51)

## 2021-02-06 MED ORDER — HYDROCODONE-ACETAMINOPHEN 5-325 MG PO TABS
1.0000 | ORAL_TABLET | Freq: Once | ORAL | Status: AC
  Administered 2021-02-06: 1 via ORAL
  Filled 2021-02-06: qty 1

## 2021-02-06 MED ORDER — CEPHALEXIN 500 MG PO CAPS: 500.0000 mg | ORAL_CAPSULE | Freq: Four times a day (QID) | ORAL | 0 refills | Status: AC

## 2021-02-06 MED ORDER — SODIUM CHLORIDE 0.9 % IV SOLN
2.0000 g | Freq: Once | INTRAVENOUS | Status: AC
  Administered 2021-02-06: 2 g via INTRAVENOUS
  Filled 2021-02-06: qty 20

## 2021-02-06 NOTE — ED Notes (Signed)
Pt ambulated to the restroom gait steady with no assistance

## 2021-02-06 NOTE — ED Provider Notes (Signed)
[facility] Emergency Department Provider Note MRN:  [id number]  Arrival date & time: 02/06/21     Chief Complaint   Back Pain, Nausea, and Emesis   History of Present Illness   Valerie Jackson is a 75 y.o. year-old female with a history of COPD, chronic back pain, hypertension presenting to the ED with chief complaint of back pain.  Location: Right flank Duration: 4 to 5 hours Onset: Gradual Timing: Constant Description: Sharp Severity: Severe Exacerbating/Alleviating Factors: None Associated Symptoms: Nausea, vomiting Pertinent Negatives: Denies fever, no cough or cold-like symptoms recently, no chest pain or shortness of breath, no numbness or weakness to the arms or legs, no bowel or bladder dysfunction.   Review of Systems  A complete 10 system review of systems was obtained and all systems are negative except as noted in the HPI and PMH.   Patient's Health History    Past Medical History:  Diagnosis Date  . Arthritis    fingers  . Blood clot in vein 1968  . Cataracts, bilateral   . CHF (congestive heart failure) ([place])   . Chronic kidney disease    CKD stage 2  . COPD (chronic obstructive pulmonary disease) ([place])   . Depression   . Diverticulitis 09/2013   with perforation  . Dizziness    with decreased BP readings or bending over  . Floaters   . GERD (gastroesophageal reflux disease)   . H/O hiatal hernia   . Headache(784.0)    migraine every 5- to 6 years  . Heart murmur since age 84   mild  . Hepatitis age 63 or 67   had heapatitis, not sure which kind  . History of blood transfusion 1965  . History of frequent urinary tract infections   . Hypercholesteremia   . Hypertension   . Hypothyroid   . Obesity   . Pericolonic abscess 2015  . Pneumonia   . Sickle cell trait [facility])     Past Surgical History:  Procedure Laterality Date  . CHOLECYSTECTOMY  2002  . COLON RESECTION N/A 10/11/2013   Procedure: EXPLORATORY LAPAROTOMY  WITH RECTOSIGMOID COLECTOMY AND END COLOSTOMY;  Surgeon: Stark Klein, MD;  [facility];  Service: General;  Laterality: N/A;  . COLOSTOMY TAKEDOWN N/A 02/27/2014   Procedure: COLOSTOMY TAKEDOWN [name] HERNIA REPAIR;  Surgeon: Stark Klein, MD;  [facility];  Service: General;  Laterality: N/A;  . INCISIONAL HERNIA REPAIR N/A 03/04/2016   Procedure: LAPAROSCOPIC INCISIONAL HERNIA WITH MESH;  Surgeon: Ralene Ok, MD;  [facility];  Service: General;  Laterality: N/A;  . LAPAROSCOPIC LYSIS OF ADHESIONS N/A 03/04/2016   Procedure: LAPAROSCOPIC LYSIS OF ADHESIONS;  Surgeon: Ralene Ok, MD;  [facility];  Service: General;  Laterality: N/A;  . RIGHT HEART CATH N/A 06/24/2017   Procedure: RIGHT HEART CATH;  Surgeon: Jolaine Artist, MD;  Location: [place] CV LAB;  Service: Cardiovascular;  Laterality: N/A;  . TONSILLECTOMY  1952    Family History  Problem Relation Age of Onset  . Hypertension Mother   . Aneurysm Mother        Cerebral (died from this)  . Cancer Mother        Uterine  . Cancer Other   . Arthritis Sister   . Hyperlipidemia Sister   . Hypertension Sister   . Stroke Sister   . Kidney disease Sister        Cancer  . Hyperlipidemia Brother   . Glaucoma Brother   .  Thyroid cancer Daughter   . Breast cancer Maternal Grandmother 78  . Hypertension Maternal Grandmother   . Hypercalcemia Neg Hx     Social History   Socioeconomic History  . Marital status: Divorced    Spouse name: Not on file  . Number of children: 1  . Years of education: 44  . Highest education level: Not on file  Occupational History  . Occupation: Retired    Comment: Therapist, sports  Tobacco Use  . Smoking status: Former Smoker    Packs/day: 0.50    Years: 52.00    Pack years: 26.00    Types: Cigarettes    Quit date: 09/06/2012    Years since quitting: 8.4  . Smokeless tobacco: Never Used  . Tobacco comment: never smoked a full PPD.   Vaping Use  . Vaping Use: Never  used  Substance and Sexual Activity  . Alcohol use: No    Alcohol/week: 0.0 standard drinks    Comment: very rarely  . Drug use: No  . Sexual activity: Not Currently  Other Topics Concern  . Not on file  Social History Narrative   Regular exercise-no   Caffeine Use-yes   Social Determinants of Health   Financial Resource Strain: Low Risk   . Difficulty of Paying Living Expenses: Not hard at all  Food Insecurity: No Food Insecurity  . Worried About Charity fundraiser in the Last Year: Never true  . Ran Out of Food in the Last Year: Never true  Transportation Needs: No Transportation Needs  . Lack of Transportation (Medical): No  . Lack of Transportation (Non-Medical): No  Physical Activity: Inactive  . Days of Exercise per Week: 0 days  . Minutes of Exercise per Session: 0 min  Stress: No Stress Concern Present  . Feeling of Stress : Not at all  Social Connections: Socially Isolated  . Frequency of Communication with Friends and Family: More than three times a week  . Frequency of Social Gatherings with Friends and Family: More than three times a week  . Attends Religious Services: Never  . Active Member of Clubs or Organizations: No  . Attends Archivist Meetings: Never  . Marital Status: Widowed  Intimate Partner Violence: Not on file     Physical Exam   Vitals:   02/06/21 0400 02/06/21 0430  BP: (!) 153/77 140/86  Pulse:  80  Resp: (!) 23 15  Temp:    SpO2:  99%    CONSTITUTIONAL: Well-appearing, NAD NEURO:  Alert and oriented x 3, no focal deficits EYES:  eyes equal and reactive ENT/NECK:  no LAD, no JVD CARDIO: Regular rate, well-perfused, normal S1 and S2 PULM:  CTAB no wheezing or rhonchi GI/GU:  normal bowel sounds, non-distended, non-tender MSK/SPINE:  No gross deformities, no edema SKIN:  no rash, atraumatic PSYCH:  Appropriate speech and behavior  *Additional and/or pertinent findings included in MDM below  Diagnostic and  Interventional Summary    EKG Interpretation  Date/Time:    Ventricular Rate:    PR Interval:    QRS Duration:   QT Interval:    QTC Calculation:   R Axis:     Text Interpretation:        Labs Reviewed  COMPREHENSIVE METABOLIC PANEL - Abnormal; Notable for the following components:      Result Value   Glucose, Bld 128 (*)    Creatinine, Ser 1.61 (*)    Total Bilirubin 1.3 (*)    GFR, Estimated 33 (*)  All other components within normal limits  URINALYSIS, ROUTINE W REFLEX MICROSCOPIC - Abnormal; Notable for the following components:   APPearance HAZY (*)    Protein, ur 100 (*)    Leukocytes,Ua LARGE (*)    WBC, UA >50 (*)    Bacteria, UA RARE (*)    All other components within normal limits  LIPASE, BLOOD  CBC    CT RENAL STONE STUDY  Final Result      Medications  ondansetron (ZOFRAN-ODT) disintegrating tablet 4 mg (has no administration in time range)  HYDROcodone-acetaminophen (NORCO/VICODIN) 5-325 MG per tablet 1 tablet (1 tablet Oral Given 02/06/21 0116)  cefTRIAXone (ROCEPHIN) 2 g in sodium chloride 0.9 % 100 mL IVPB (0 g Intravenous Stopped 02/06/21 0347)     Procedures  /  Critical Care Procedures  ED Course and Medical Decision Making  I have reviewed the triage vital signs, the nursing notes, and pertinent available records from the EMR.  Listed above are laboratory and imaging tests that I personally ordered, reviewed, and interpreted and then considered in my medical decision making (see below for details).  MSK versus kidney stone versus diverticulitis.  CT pending.     CT reveals some inflammatory findings that with the focal tenderness on exam and the urinalysis all suggest pyelonephritis.  Provided with ceftriaxone and patient is resting comfortably.  Differential diagnosis included on CT imaging of less concern at this time, patient is appropriate for discharge and antibiotics and will follow up with PCP to see if any follow-up is needed  regarding the CT imaging.  Barth Kirks. Sedonia Small, [place] [email or web address]  Final Clinical Impressions(s) / ED Diagnoses     ICD-10-CM   1. Pyelonephritis  N12     ED Discharge Orders         Ordered    cephALEXin (KEFLEX) 500 MG capsule  4 times daily        02/06/21 0606           Discharge Instructions Discussed with and Provided to Patient:     Discharge Instructions     You were evaluated in the Emergency Department and after careful evaluation, we did not find any emergent condition requiring admission or further testing in the hospital.  Your exam/testing today was overall reassuring.  Symptoms seem to be due to a kidney infection.  Please take the antibiotics as directed.  Use Tylenol or Motrin at home for discomfort.  We recommend that you keep your PCP follow-up appointment and discuss your emergency department visit with them.  Please have them review your CT imaging.  Please return to the Emergency Department if you experience any worsening of your condition.  Thank you for allowing [place] to be a part of your care.        Maudie Flakes, MD 02/06/21 [phone]

## 2021-02-06 NOTE — Discharge Instructions (Addendum)
You were evaluated in the Emergency Department and after careful evaluation, we did not find any emergent condition requiring admission or further testing in the hospital.  Your exam/testing today was overall reassuring.  Symptoms seem to be due to a kidney infection.  Please take the antibiotics as directed.  Use Tylenol or Motrin at home for discomfort.  We recommend that you keep your PCP follow-up appointment and discuss your emergency department visit with them.  Please have them review your CT imaging.  Please return to the Emergency Department if you experience any worsening of your condition.  Thank you for allowing [place] to be a part of your care.

## 2021-02-11 ENCOUNTER — Telehealth: Payer: Self-pay | Admitting: Internal Medicine

## 2021-02-11 ENCOUNTER — Other Ambulatory Visit: Payer: Self-pay | Admitting: Internal Medicine

## 2021-02-11 MED ORDER — FLUCONAZOLE 150 MG PO TABS: 150.0000 mg | ORAL_TABLET | Freq: Once | ORAL | 3 refills | Status: AC

## 2021-02-11 NOTE — Telephone Encounter (Signed)
Patient called and said that she was given an antibiotic in the ED for a kidney infection. She said that she has some severe itching near her vagina and rectum. She was wondering if she could stop the medication. She said that she has been taking it for the last 7 days. She can be reached at [phone]. She said that she has also lost 5 pounds since last week. Appointment with another provider was offered and patient declined. Please advise

## 2021-02-11 NOTE — Telephone Encounter (Signed)
Called pt, LVM.   

## 2021-02-11 NOTE — Telephone Encounter (Signed)
This sounds like a vaginal yeast infection Does she agree?

## 2021-02-11 NOTE — Telephone Encounter (Signed)
Patient called back and said that she agrees with Dr. Ronnald Ramp that this might be a vaginal yeast infection. She said that she thinks it is from the antibiotics.

## 2021-02-24 ENCOUNTER — Other Ambulatory Visit: Payer: Self-pay | Admitting: Internal Medicine

## 2021-02-24 DIAGNOSIS — F418 Other specified anxiety disorders: Secondary | ICD-10-CM

## 2021-02-27 ENCOUNTER — Other Ambulatory Visit: Payer: Self-pay

## 2021-02-27 ENCOUNTER — Encounter: Payer: Self-pay | Admitting: Internal Medicine

## 2021-02-27 ENCOUNTER — Ambulatory Visit (INDEPENDENT_AMBULATORY_CARE_PROVIDER_SITE_OTHER): Payer: Medicare Other | Admitting: Internal Medicine

## 2021-02-27 VITALS — BP 170/98 | HR 93 | Temp 98.2°F | Ht 65.0 in | Wt 201.0 lb

## 2021-02-27 DIAGNOSIS — R590 Localized enlarged lymph nodes: Secondary | ICD-10-CM | POA: Insufficient documentation

## 2021-02-27 DIAGNOSIS — I10 Essential (primary) hypertension: Secondary | ICD-10-CM

## 2021-02-27 DIAGNOSIS — E039 Hypothyroidism, unspecified: Secondary | ICD-10-CM | POA: Diagnosis not present

## 2021-02-27 LAB — URINALYSIS, ROUTINE W REFLEX MICROSCOPIC
Bilirubin Urine: NEGATIVE
Ketones, ur: NEGATIVE
Nitrite: NEGATIVE
Specific Gravity, Urine: 1.015 (ref 1.000–1.030)
Total Protein, Urine: 100 — AB
Urine Glucose: NEGATIVE
Urobilinogen, UA: 0.2 (ref 0.0–1.0)
pH: 6.5 (ref 5.0–8.0)

## 2021-02-27 LAB — TSH: TSH: 0.31 u[IU]/mL — ABNORMAL LOW (ref 0.35–4.50)

## 2021-02-27 LAB — TROPONIN I (HIGH SENSITIVITY): High Sens Troponin I: 5 ng/L (ref 2–17)

## 2021-02-27 MED ORDER — NEBIVOLOL HCL 5 MG PO TABS: 5.0000 mg | ORAL_TABLET | Freq: Every day | ORAL | 0 refills | Status: DC

## 2021-02-27 MED ORDER — LEVOTHYROXINE SODIUM 125 MCG PO TABS: 125.0000 ug | ORAL_TABLET | Freq: Every day | ORAL | 0 refills | Status: DC

## 2021-02-27 MED ORDER — AMLODIPINE BESYLATE 5 MG PO TABS
5.0000 mg | ORAL_TABLET | Freq: Every day | ORAL | 1 refills | Status: DC
Start: 2021-02-27 — End: 2021-05-13

## 2021-02-27 MED ORDER — INDAPAMIDE 1.25 MG PO TABS: 1.2500 mg | ORAL_TABLET | Freq: Every day | ORAL | 0 refills | Status: DC

## 2021-02-27 NOTE — Patient Instructions (Signed)

## 2021-02-27 NOTE — Progress Notes (Signed)
Subjective:  Patient ID: Valerie Jackson, female    DOB: [birth year]  Age: 75 y.o. MRN: [id number]  CC: Hypertension  This visit occurred during the SARS-CoV-2 public health emergency.  Safety protocols were in place, including screening questions prior to the visit, additional usage of staff PPE, and extensive cleaning of exam room while observing appropriate contact time as indicated for disinfecting solutions.    HPI Valerie Jackson presents for f/up -  She complains that her blood pressure has not been well controlled and she has had headaches for a couple of days.  She is not taking amlodipine because she does not like to take medications.  She is not taking the potassium supplement because it occasionally causes nausea.  She denies blurred vision, paresthesias, chest pain, shortness of breath, diaphoresis, dizziness, or lightheadedness.  Outpatient Medications Prior to Visit  Medication Sig Dispense Refill   acetaminophen (TYLENOL) 500 MG tablet Take 1 tablet (500 mg total) by mouth every 6 (six) hours as needed. 30 tablet 0   allopurinol (ZYLOPRIM) 100 MG tablet Take 1 tablet (100 mg total) by mouth daily. 90 tablet 1   ALPRAZolam (XANAX) 0.5 MG tablet TAKE ONE TABLET BY MOUTH AT BEDTIME AS NEEDED FOR ANXIETY 30 tablet 0   Calcium Carbonate-Vit D-Min (CALCIUM 1200 PO) Take 1 tablet by mouth daily.     cholecalciferol (VITAMIN D) 1000 UNITS tablet Take 2,000 Units by mouth daily.      colchicine 0.6 MG tablet TAKE ONE TABLET BY MOUTH ONE TIME DAILY 30 tablet 0   colestipol (COLESTID) 1 g tablet TAKE ONE TABLET BY MOUTH TWICE DAILY 60 tablet 0   cyanocobalamin (,VITAMIN B-12,) 1000 MCG/ML injection Inject 1 mL (1,000 mcg total) into the muscle every 30 (thirty) days. 4 mL 3   potassium chloride SA (KLOR-CON) 20 MEQ tablet TAKE 1 TABLET(20 MEQ) BY MOUTH TWICE DAILY 180 tablet 0   rizatriptan (MAXALT-MLT) 10 MG disintegrating tablet Take 1 tablet (10 mg total) by mouth as needed for  migraine. May repeat in 2 hours if needed 30 tablet 1   SYRINGE-NEEDLE, DISP, 3 ML 25G X 5/8" 3 ML MISC 1 each by Does not apply route every 30 (thirty) days. 50 each 0   amLODipine (NORVASC) 5 MG tablet Take 1 tablet (5 mg total) by mouth daily. 90 tablet 1   Fluticasone Furoate (ARNUITY ELLIPTA) 100 MCG/ACT AEPB Inhale 1 puff into the lungs daily. 90 each 3   levothyroxine (SYNTHROID) 175 MCG tablet Take 1 tablet (175 mcg total) by mouth daily before breakfast. 90 tablet 3   methocarbamol (ROBAXIN) 500 MG tablet Take 1 tablet (500 mg total) by mouth every 8 (eight) hours as needed for muscle spasms. 60 tablet 3   tiZANidine (ZANAFLEX) 4 MG tablet Take 1 tablet (4 mg total) by mouth every 6 (six) hours as needed for muscle spasms. 30 tablet 0   No facility-administered medications prior to visit.    ROS Review of Systems  Constitutional:  Negative for diaphoresis, fatigue and unexpected weight change.  HENT: Negative.    Eyes: Negative.  Negative for visual disturbance.  Respiratory:  Negative for apnea, cough, chest tightness, shortness of breath and wheezing.   Cardiovascular:  Negative for chest pain, palpitations and leg swelling.  Gastrointestinal:  Negative for abdominal pain, diarrhea, nausea and vomiting.  Endocrine: Positive for heat intolerance. Negative for cold intolerance.  Genitourinary: Negative.  Negative for difficulty urinating and hematuria.  Musculoskeletal:  Negative for  arthralgias and myalgias.  Skin: Negative.  Negative for color change and pallor.  Neurological:  Positive for headaches. Negative for dizziness, speech difficulty, weakness and light-headedness.  Hematological:  Negative for adenopathy. Does not bruise/bleed easily.  Psychiatric/Behavioral: Negative.     Objective:  BP (!) 170/98 (BP Location: Left Arm, Patient Position: Sitting, Cuff Size: Large)   Pulse 93   Temp 98.2 F (36.8 C) (Oral)   Ht 5' 5"  (1.651 m)   Wt 201 lb (91.2 kg)   SpO2 97%    BMI 33.45 kg/m   BP Readings from Last 3 Encounters:  02/27/21 (!) 170/98  02/06/21 139/86  11/20/20 132/86    Wt Readings from Last 3 Encounters:  02/27/21 201 lb (91.2 kg)  02/05/21 205 lb (93 kg)  11/20/20 204 lb 9.6 oz (92.8 kg)    Physical Exam Vitals reviewed.  Constitutional:      General: She is not in acute distress.    Appearance: She is not ill-appearing, toxic-appearing or diaphoretic.  HENT:     Nose: Nose normal.     Mouth/Throat:     Mouth: Mucous membranes are moist.  Eyes:     General: No scleral icterus.    Extraocular Movements: Extraocular movements intact.     Pupils: Pupils are equal, round, and reactive to light.  Cardiovascular:     Rate and Rhythm: Normal rate and regular rhythm.     Heart sounds: No murmur heard.    Comments: EKG- NSR, 80 bpm NS T waves changes No LVH or Q waves Pulmonary:     Effort: Pulmonary effort is normal.     Breath sounds: No stridor. No wheezing, rhonchi or rales.  Abdominal:     General: Abdomen is flat.     Palpations: There is no mass.     Tenderness: There is no abdominal tenderness. There is no guarding.     Hernia: No hernia is present.  Musculoskeletal:        General: No swelling or tenderness. Normal range of motion.     Cervical back: Neck supple.  Lymphadenopathy:     Cervical: No cervical adenopathy.  Skin:    General: Skin is warm and dry.  Neurological:     General: No focal deficit present.     Mental Status: She is alert and oriented to person, place, and time. Mental status is at baseline.  Psychiatric:        Mood and Affect: Mood normal.        Behavior: Behavior normal.    Lab Results  Component Value Date   WBC 8.8 02/06/2021   HGB 13.2 02/06/2021   HCT 39.1 02/06/2021   PLT 227 02/06/2021   GLUCOSE 128 (H) 02/06/2021   CHOL 256 (H) 06/17/2020   TRIG 210.0 (H) 06/17/2020   HDL 57.20 06/17/2020   LDLDIRECT 165.0 06/17/2020   LDLCALC 130 (H) 01/16/2019   ALT 44 02/06/2021    AST 32 02/06/2021   NA 139 02/06/2021   K 3.5 02/06/2021   CL 105 02/06/2021   CREATININE 1.61 (H) 02/06/2021   BUN 10 02/06/2021   CO2 25 02/06/2021   TSH 0.31 (L) 02/27/2021   INR 1.02 06/24/2017   HGBA1C 4.7 05/14/2014    CT RENAL STONE STUDY  Result Date: 02/06/2021 CLINICAL DATA:  Flank pain. Kidney stone suspected. Right side. White blood cell 8.8. Urinalysis pending. History: Colon resection with colostomy takedown. Cholecystectomy. Chronic kidney disease. Diverticulitis. GERD. Hiatal hernia. UTIs. EXAM:  CT ABDOMEN AND PELVIS WITHOUT CONTRAST TECHNIQUE: Multidetector CT imaging of the abdomen and pelvis was performed following the standard protocol without IV contrast. COMPARISON:  CT abdomen pelvis 04/01/2017 FINDINGS: Lower chest: No acute abnormality.  Small hiatal hernia. Hepatobiliary: The liver is enlarged measuring up to 20 cm. The hepatic parenchyma is diffusely hypodense compared to the splenic parenchyma consistent with fatty infiltration. No focal liver abnormality. Slight heterogeneity to the hepatic parenchyma similar to prior. Status post cholecystectomy. No biliary dilatation. Pancreas: Diffusely atrophic. No focal lesion. Normal pancreatic contour. No main pancreatic ductal dilatation. Spleen: Normal in size without focal abnormality. Adrenals/Urinary Tract: No adrenal nodule bilaterally. Subcentimeter hypodensity within the right kidney too small to characterize. No nephrolithiasis, no hydronephrosis, and no contour-deforming renal mass. No ureterolithiasis or hydroureter. The urinary bladder is unremarkable. Stomach/Bowel: Stomach is within normal limits. No evidence of bowel wall thickening or dilatation. Partial colectomy with sigmoid reanastomosis. No pneumatosis. Appendix appears normal. Vascular/Lymphatic: No mesenteric venous gas. No portal venous gas. Nonspecific flattened inferior vena cava. No abdominal aorta or iliac aneurysm. Mild atherosclerotic plaque of the aorta  and its branches. Prominent but nonenlarged retroperitoneal lymph nodes in the setting of retroperitoneal fat stranding. No abdominal, pelvic, or inguinal lymphadenopathy. Reproductive: Uterus and bilateral adnexa are unremarkable. Other: Fat stranding and trace free fluid within the upper abdomen with fluid centered along the retroperitoneum in the region of the pancreas and kidneys. No intraperitoneal free gas. No organized fluid collection. Musculoskeletal: No abdominal wall hernia or abnormality. No suspicious lytic or blastic osseous lesions. No acute displaced fracture. Multilevel degenerative changes of the spine. IMPRESSION: 1. Nonspecific fat stranding and trace free fluid within the upper abdomen with fluid centered along the retroperitoneum in the region of the pancreas and kidneys. Associated prominent retroperitoneal lymph nodes. Findings of unclear etiology. Differential diagnosis includes infection/inflammation (such as acute pancreatitis a renal infection), malignancy, thrombosis. Limited evaluation on this noncontrast study. 2. Hepatomegaly and marked hepatic steatosis. 3. Small hiatal hernia. Electronically Signed   By: Iven Finn M.D.   On: 02/06/2021 02:08    Assessment & Plan:   Valerie Jackson was seen today for hypertension.  Diagnoses and all orders for this visit:  Primary hypertension- Her blood pressure is not well controlled.  I have asked to be more compliant with amlodipine.  Will check labs to screen for secondary causes and endorgan damage.  Will add indapamide and nebivolol to her regimen. -     amLODipine (NORVASC) 5 MG tablet; Take 1 tablet (5 mg total) by mouth daily. -     Aldosterone + renin activity w/ ratio; Future -     TSH; Future -     Urinalysis, Routine w reflex microscopic; Future -     indapamide (LOZOL) 1.25 MG tablet; Take 1 tablet (1.25 mg total) by mouth daily. -     nebivolol (BYSTOLIC) 5 MG tablet; Take 1 tablet (5 mg total) by mouth daily. -      Troponin I (High Sensitivity); Future -     Troponin I (High Sensitivity) -     Urinalysis, Routine w reflex microscopic -     TSH -     Aldosterone + renin activity w/ ratio -     EKG 12-Lead  Retroperitoneal lymphadenopathy -     Ambulatory referral to Hematology / Oncology  Acquired hypothyroidism- Her T4 dose is too high.  I have asked her not to take T4 for 5 days and then to restart at 125  mcg a day. -     TSH; Future -     TSH -     levothyroxine (SYNTHROID) 125 MCG tablet; Take 1 tablet (125 mcg total) by mouth daily.  I have discontinued Valerie Jackson R. Sossamon's methocarbamol, Arnuity Ellipta, tiZANidine, and levothyroxine. I am also having her start on indapamide, nebivolol, and levothyroxine. Additionally, I am having her maintain her cholecalciferol, rizatriptan, Calcium Carbonate-Vit D-Min (CALCIUM 1200 PO), acetaminophen, SYRINGE-NEEDLE (DISP) 3 ML, potassium chloride SA, cyanocobalamin, allopurinol, colestipol, colchicine, ALPRAZolam, and amLODipine.  Meds ordered this encounter  Medications   amLODipine (NORVASC) 5 MG tablet    Sig: Take 1 tablet (5 mg total) by mouth daily.    Dispense:  90 tablet    Refill:  1   indapamide (LOZOL) 1.25 MG tablet    Sig: Take 1 tablet (1.25 mg total) by mouth daily.    Dispense:  90 tablet    Refill:  0   nebivolol (BYSTOLIC) 5 MG tablet    Sig: Take 1 tablet (5 mg total) by mouth daily.    Dispense:  90 tablet    Refill:  0   levothyroxine (SYNTHROID) 125 MCG tablet    Sig: Take 1 tablet (125 mcg total) by mouth daily.    Dispense:  90 tablet    Refill:  0     Follow-up: Return in about 3 weeks (around 03/20/2021).  [name], MD

## 2021-02-28 ENCOUNTER — Other Ambulatory Visit: Payer: Self-pay | Admitting: Internal Medicine

## 2021-03-06 LAB — ALDOSTERONE + RENIN ACTIVITY W/ RATIO
ALDO / PRA Ratio: 9.1 Ratio (ref 0.9–28.9)
Aldosterone: 3 ng/dL
Renin Activity: 0.33 ng/mL/h (ref 0.25–5.82)

## 2021-03-07 ENCOUNTER — Telehealth: Payer: Self-pay | Admitting: Internal Medicine

## 2021-03-07 MED ORDER — COLESTIPOL HCL 1 G PO TABS: 1.0000 g | ORAL_TABLET | Freq: Two times a day (BID) | ORAL | 0 refills | Status: DC

## 2021-03-07 NOTE — Telephone Encounter (Signed)
Colestipol sent to patients pharmacy [facility] as requested. Patient does have a follow up appointment scheduled

## 2021-03-07 NOTE — Telephone Encounter (Signed)
Inbound call from patient. Need medication refill for colestipol sent to [facility] on [address]. Have appt scheduled with Dr. Henrene Pastor 7/28

## 2021-03-24 ENCOUNTER — Other Ambulatory Visit: Payer: Self-pay | Admitting: Internal Medicine

## 2021-04-10 ENCOUNTER — Ambulatory Visit (INDEPENDENT_AMBULATORY_CARE_PROVIDER_SITE_OTHER): Payer: Medicare Other | Admitting: Internal Medicine

## 2021-04-10 ENCOUNTER — Encounter: Payer: Self-pay | Admitting: Internal Medicine

## 2021-04-10 ENCOUNTER — Other Ambulatory Visit: Payer: Self-pay

## 2021-04-10 VITALS — BP 116/68 | HR 72 | Temp 98.2°F | Ht 65.0 in | Wt 202.0 lb

## 2021-04-10 VITALS — BP 118/62 | HR 59 | Ht 65.0 in | Wt 202.0 lb

## 2021-04-10 DIAGNOSIS — R739 Hyperglycemia, unspecified: Secondary | ICD-10-CM | POA: Insufficient documentation

## 2021-04-10 DIAGNOSIS — Z86010 Personal history of colonic polyps: Secondary | ICD-10-CM

## 2021-04-10 DIAGNOSIS — I700 Atherosclerosis of aorta: Secondary | ICD-10-CM

## 2021-04-10 DIAGNOSIS — T502X5A Adverse effect of carbonic-anhydrase inhibitors, benzothiadiazides and other diuretics, initial encounter: Secondary | ICD-10-CM | POA: Diagnosis not present

## 2021-04-10 DIAGNOSIS — K9089 Other intestinal malabsorption: Secondary | ICD-10-CM | POA: Diagnosis not present

## 2021-04-10 DIAGNOSIS — E039 Hypothyroidism, unspecified: Secondary | ICD-10-CM | POA: Diagnosis not present

## 2021-04-10 DIAGNOSIS — R590 Localized enlarged lymph nodes: Secondary | ICD-10-CM

## 2021-04-10 DIAGNOSIS — E876 Hypokalemia: Secondary | ICD-10-CM | POA: Diagnosis not present

## 2021-04-10 DIAGNOSIS — K7581 Nonalcoholic steatohepatitis (NASH): Secondary | ICD-10-CM

## 2021-04-10 DIAGNOSIS — E538 Deficiency of other specified B group vitamins: Secondary | ICD-10-CM | POA: Diagnosis not present

## 2021-04-10 DIAGNOSIS — I10 Essential (primary) hypertension: Secondary | ICD-10-CM | POA: Diagnosis not present

## 2021-04-10 LAB — HEMOGLOBIN A1C: Hgb A1c MFr Bld: 5 % (ref 4.6–6.5)

## 2021-04-10 LAB — BASIC METABOLIC PANEL
BUN: 15 mg/dL (ref 6–23)
CO2: 27 mEq/L (ref 19–32)
Calcium: 10.1 mg/dL (ref 8.4–10.5)
Chloride: 103 mEq/L (ref 96–112)
Creatinine, Ser: 1.86 mg/dL — ABNORMAL HIGH (ref 0.40–1.20)
GFR: 26.06 mL/min — ABNORMAL LOW (ref >60.00)
Glucose, Bld: 136 mg/dL — ABNORMAL HIGH (ref 70–99)
Potassium: 3.2 mEq/L — ABNORMAL LOW (ref 3.5–5.1)
Sodium: 140 mEq/L (ref 135–145)

## 2021-04-10 LAB — FOLATE: Folate: 5.6 ng/mL — ABNORMAL LOW (ref >5.9)

## 2021-04-10 LAB — TSH: TSH: 1.7 u[IU]/mL (ref 0.35–5.50)

## 2021-04-10 MED ORDER — CYANOCOBALAMIN 1000 MCG/ML IJ SOLN: 1000.0000 ug | INTRAMUSCULAR | 3 refills | Status: DC

## 2021-04-10 MED ORDER — COLESTIPOL HCL 1 G PO TABS: 1.0000 g | ORAL_TABLET | Freq: Two times a day (BID) | ORAL | 11 refills | Status: DC

## 2021-04-10 MED ORDER — FOLIC ACID 1 MG PO TABS: 1.0000 mg | ORAL_TABLET | Freq: Every day | ORAL | 1 refills | Status: DC

## 2021-04-10 MED ORDER — POTASSIUM CHLORIDE CRYS ER 20 MEQ PO TBCR: 20.0000 meq | EXTENDED_RELEASE_TABLET | Freq: Two times a day (BID) | ORAL | 0 refills | Status: DC

## 2021-04-10 NOTE — Patient Instructions (Addendum)
If you are age 76 or older, your body mass index should be between 23-30. Your Body mass index is 33.61 kg/m. If this is out of the aforementioned range listed, please consider follow up with your Primary Care Provider.  __________________________________________________________  The [facility] providers would like to encourage you to use [facility] to communicate with providers for non-urgent requests or questions.  Due to long hold times on the telephone, sending your provider a message by [facility] may be a faster and more efficient way to get a response.  Please allow 48 business hours for a response.  Please remember that this is for non-urgent requests.   We have sent the following medications to your pharmacy for you to pick up at your convenience:  Colestid  We will see you in the office in 2 years (July 2023).  We will contact you to schedule the appointment.  Thank you for entrusting me with your care and choosing [facility].  Dr Henrene Pastor

## 2021-04-10 NOTE — Progress Notes (Signed)
HISTORY OF PRESENT ILLNESS:  Valerie Jackson is a 75 y.o. female, retired Marine scientist originally from [place], with remote history of perforated diverticulitis requiring surgery.  She was last seen via telehealth medicine March 07, 2019 regarding chronic but worsening postprandial diarrhea with urgency, incontinence, and decreased quality of life.  She also has a history of adenomatous and sessile serrated colon polyps.  She was set up for complete colonoscopy which was performed March 15, 2019.  She was found to have a diminutive adenomatous colon polyp which was removed.  As well diverticulosis.  Random colon biopsies returned normal.  She was placed on Colestid 1 to 2 g twice daily.  She follows up at this time to request medication refill.  Patient tells me that she has been taking Colestid 1 g twice daily.  With this she has 2 or 3 formed bowel movements per day.  Without Colestid, significant recurrent diarrhea.  She is delighted with the effect of the medication.  No appreciable side effects.  Review of blood work from May 2022 shows unremarkable comprehensive metabolic panel except for creatinine of 1.61.  Normal CBC with hemoglobin 13.2  REVIEW OF SYSTEMS:  All non-GI ROS negative unless otherwise stated in the HPI except for arthritis  Past Medical History:  Diagnosis Date   Arthritis    fingers   Blood clot in vein 1968   Cataracts, bilateral    CHF (congestive heart failure) (HCC)    Chronic kidney disease    CKD stage 2   COPD (chronic obstructive pulmonary disease) (HCC)    Depression    Diverticulitis 09/2013   with perforation   Dizziness    with decreased BP readings or bending over   Floaters    GERD (gastroesophageal reflux disease)    H/O hiatal hernia    Headache(784.0)    migraine every 5- to 6 years   Heart murmur since age 89   mild   Hepatitis age 59 or 30   had heapatitis, not sure which kind   History of blood transfusion 1965   History of frequent urinary tract  infections    Hypercholesteremia    Hypertension    Hypothyroid    Obesity    Pericolonic abscess 2015   Pneumonia    Sickle cell trait ([place])     Past Surgical History:  Procedure Laterality Date   CHOLECYSTECTOMY  2002   COLON RESECTION N/A 10/11/2013   Procedure: EXPLORATORY LAPAROTOMY WITH RECTOSIGMOID COLECTOMY AND END COLOSTOMY;  Surgeon: Stark Klein, MD;  Location: [facility];  Service: General;  Laterality: N/A;   COLOSTOMY TAKEDOWN N/A 02/27/2014   Procedure: COLOSTOMY TAKEDOWN [name] HERNIA REPAIR;  Surgeon: Stark Klein, MD;  Location: [facility];  Service: General;  Laterality: N/A;   INCISIONAL HERNIA REPAIR N/A 03/04/2016   Procedure: LAPAROSCOPIC INCISIONAL HERNIA WITH MESH;  Surgeon: Ralene Ok, MD;  Location: [facility];  Service: General;  Laterality: N/A;   LAPAROSCOPIC LYSIS OF ADHESIONS N/A 03/04/2016   Procedure: LAPAROSCOPIC LYSIS OF ADHESIONS;  Surgeon: Ralene Ok, MD;  Location: [facility];  Service: General;  Laterality: N/A;   RIGHT HEART CATH N/A 06/24/2017   Procedure: RIGHT HEART CATH;  Surgeon: Jolaine Artist, MD;  Location: [facility];  Service: Cardiovascular;  Laterality: N/A;   Luquillo  reports that she quit smoking about 8 years ago. Her smoking use included cigarettes. She has a 26.00 pack-year smoking history.  She has never used smokeless tobacco. She reports that she does not drink alcohol and does not use drugs.  family history includes Aneurysm in her mother; Arthritis in her sister; Breast cancer (age of onset: 38) in her maternal grandmother; Cancer in her mother and another family member; Glaucoma in her brother; Hyperlipidemia in her brother and sister; Hypertension in her maternal grandmother, mother, and sister; Kidney disease in her sister; Stroke in her sister; Thyroid cancer in her daughter.  Allergies  Allergen Reactions   Crestor [Rosuvastatin] Other (See Comments)    Myalgias     Estrogens     Blood clot in leg       PHYSICAL EXAMINATION: Vital signs: BP 118/62   Pulse (!) 59   Ht 5' 5"  (1.651 m)   Wt 202 lb (91.6 kg)   SpO2 97%   BMI 33.61 kg/m   Constitutional: generally well-appearing, no acute distress Psychiatric: alert and oriented x3, cooperative Eyes: extraocular movements intact, anicteric, conjunctiva pink Mouth: Mask Neck: supple no lymphadenopathy Cardiovascular: heart regular rate and rhythm, no murmur Lungs: clear to auscultation bilaterally Abdomen: soft, nontender, nondistended, no obvious ascites, no peritoneal signs, normal bowel sounds, no organomegaly Rectal: Omitted Extremities: no clubbing, cyanosis, or lower extremity edema bilaterally Skin: no lesions on visible extremities Neuro: No focal deficits.  Cranial nerves intact  ASSESSMENT:  1.  Bile salt related diarrhea.  Nicely controlled with Colestid 2.  History of perforated diverticulitis requiring surgery, elsewhere. 3.  History of adenomatous colon polyps.  Surveillance up-to-date 4.  General medical problems.  Stable   PLAN:  1.  Refill Colestid.  Currently taking 1 g twice daily.  Advised to take several hours removed from other medications.  Medication risks reviewed. 2.  Routine office follow-up 2 years.  Contact the office in the interim for any questions or problems 3.  Routine surveillance colonoscopy planned around July 2025

## 2021-04-10 NOTE — Progress Notes (Signed)
.0 folate  Subjective:  Patient ID: Valerie Jackson, female    DOB: 1946/06/29  Age: 75 y.o. MRN: [id number]  CC: Hypertension and Hypothyroidism  This visit occurred during the SARS-CoV-2 public health emergency.  Safety protocols were in place, including screening questions prior to the visit, additional usage of staff PPE, and extensive cleaning of exam room while observing appropriate contact time as indicated for disinfecting solutions.    HPI Valerie Jackson presents for f/up -  Her form of exercising is climbing stairs.  She does not experience chest pain, shortness of breath, diaphoresis, or edema.  She does complain of intermittent lightheadedness.  She also complains of worsening fatigue and joint pain.  Outpatient Medications Prior to Visit  Medication Sig Dispense Refill   acetaminophen (TYLENOL) 500 MG tablet Take 1 tablet (500 mg total) by mouth every 6 (six) hours as needed. 30 tablet 0   allopurinol (ZYLOPRIM) 100 MG tablet Take 1 tablet (100 mg total) by mouth daily. 90 tablet 1   ALPRAZolam (XANAX) 0.5 MG tablet TAKE ONE TABLET BY MOUTH AT BEDTIME AS NEEDED FOR ANXIETY 30 tablet 0   amLODipine (NORVASC) 5 MG tablet Take 1 tablet (5 mg total) by mouth daily. 90 tablet 1   Calcium Carbonate-Vit D-Min (CALCIUM 1200 PO) Take 1 tablet by mouth daily.     cholecalciferol (VITAMIN D) 1000 UNITS tablet Take 2,000 Units by mouth daily.      colchicine 0.6 MG tablet TAKE ONE TABLET BY MOUTH ONE TIME DAILY 30 tablet 0   colestipol (COLESTID) 1 g tablet Take 1 tablet (1 g total) by mouth 2 (two) times daily. 60 tablet 11   levothyroxine (SYNTHROID) 125 MCG tablet Take 1 tablet (125 mcg total) by mouth daily. 90 tablet 0   nebivolol (BYSTOLIC) 5 MG tablet Take 1 tablet (5 mg total) by mouth daily. 90 tablet 0   rizatriptan (MAXALT-MLT) 10 MG disintegrating tablet Take 1 tablet (10 mg total) by mouth as needed for migraine. May repeat in 2 hours if needed 30 tablet 1   SYRINGE-NEEDLE,  DISP, 3 ML 25G X 5/8" 3 ML MISC 1 each by Does not apply route every 30 (thirty) days. 50 each 0   cyanocobalamin (,VITAMIN B-12,) 1000 MCG/ML injection Inject 1 mL (1,000 mcg total) into the muscle every 30 (thirty) days. 4 mL 3   indapamide (LOZOL) 1.25 MG tablet Take 1 tablet (1.25 mg total) by mouth daily. 90 tablet 0   potassium chloride SA (KLOR-CON) 20 MEQ tablet TAKE 1 TABLET(20 MEQ) BY MOUTH TWICE DAILY 180 tablet 0   No facility-administered medications prior to visit.    ROS Review of Systems  Constitutional:  Positive for fatigue. Negative for chills, diaphoresis and unexpected weight change.  HENT: Negative.    Eyes: Negative.   Respiratory:  Negative for cough, chest tightness, shortness of breath and wheezing.   Cardiovascular:  Negative for chest pain, palpitations and leg swelling.  Gastrointestinal:  Negative for abdominal pain, diarrhea, nausea and vomiting.  Endocrine: Negative.   Genitourinary: Negative.  Negative for difficulty urinating.  Musculoskeletal:  Positive for arthralgias. Negative for back pain and myalgias.  Skin: Negative.  Negative for color change and pallor.  Neurological:  Positive for dizziness and light-headedness. Negative for weakness and numbness.  Hematological:  Negative for adenopathy. Does not bruise/bleed easily.  Psychiatric/Behavioral: Negative.     Objective:  BP 116/68 (BP Location: Left Arm, Patient Position: Sitting, Cuff Size: Large)   Pulse 72  Temp 98.2 F (36.8 C) (Oral)   Ht 5' 5"  (1.651 m)   Wt 202 lb (91.6 kg)   SpO2 95%   BMI 33.61 kg/m   BP Readings from Last 3 Encounters:  04/10/21 116/68  04/10/21 118/62  02/27/21 (!) 170/98    Wt Readings from Last 3 Encounters:  04/10/21 202 lb (91.6 kg)  04/10/21 202 lb (91.6 kg)  02/27/21 201 lb (91.2 kg)    Physical Exam Vitals reviewed.  Constitutional:      Appearance: Normal appearance.  HENT:     Nose: Nose normal.     Mouth/Throat:     Mouth: Mucous  membranes are moist.  Eyes:     Conjunctiva/sclera: Conjunctivae normal.  Cardiovascular:     Rate and Rhythm: Normal rate and regular rhythm.     Heart sounds: No murmur heard. Pulmonary:     Effort: Pulmonary effort is normal.     Breath sounds: No stridor. No wheezing, rhonchi or rales.  Abdominal:     General: Abdomen is flat.     Palpations: There is no mass.     Tenderness: There is no abdominal tenderness. There is no guarding.  Musculoskeletal:        General: Normal range of motion.     Cervical back: Neck supple. No tenderness.     Right lower leg: No edema.     Left lower leg: No edema.  Lymphadenopathy:     Cervical: No cervical adenopathy.  Skin:    General: Skin is warm and dry.  Neurological:     General: No focal deficit present.     Mental Status: She is alert.  Psychiatric:        Mood and Affect: Mood normal.        Behavior: Behavior normal.    Lab Results  Component Value Date   WBC 8.8 02/06/2021   HGB 13.2 02/06/2021   HCT 39.1 02/06/2021   PLT 227 02/06/2021   GLUCOSE 136 (H) 04/10/2021   CHOL 256 (H) 06/17/2020   TRIG 210.0 (H) 06/17/2020   HDL 57.20 06/17/2020   LDLDIRECT 165.0 06/17/2020   LDLCALC 130 (H) 01/16/2019   ALT 44 02/06/2021   AST 32 02/06/2021   NA 140 04/10/2021   K 3.2 (L) 04/10/2021   CL 103 04/10/2021   CREATININE 1.86 (H) 04/10/2021   BUN 15 04/10/2021   CO2 27 04/10/2021   TSH 1.70 04/10/2021   INR 1.02 06/24/2017   HGBA1C 5.0 04/10/2021    CT RENAL STONE STUDY  Result Date: 02/06/2021 CLINICAL DATA:  Flank pain. Kidney stone suspected. Right side. White blood cell 8.8. Urinalysis pending. History: Colon resection with colostomy takedown. Cholecystectomy. Chronic kidney disease. Diverticulitis. GERD. Hiatal hernia. UTIs. EXAM: CT ABDOMEN AND PELVIS WITHOUT CONTRAST TECHNIQUE: Multidetector CT imaging of the abdomen and pelvis was performed following the standard protocol without IV contrast. COMPARISON:  CT abdomen  pelvis 04/01/2017 FINDINGS: Lower chest: No acute abnormality.  Small hiatal hernia. Hepatobiliary: The liver is enlarged measuring up to 20 cm. The hepatic parenchyma is diffusely hypodense compared to the splenic parenchyma consistent with fatty infiltration. No focal liver abnormality. Slight heterogeneity to the hepatic parenchyma similar to prior. Status post cholecystectomy. No biliary dilatation. Pancreas: Diffusely atrophic. No focal lesion. Normal pancreatic contour. No main pancreatic ductal dilatation. Spleen: Normal in size without focal abnormality. Adrenals/Urinary Tract: No adrenal nodule bilaterally. Subcentimeter hypodensity within the right kidney too small to characterize. No nephrolithiasis, no hydronephrosis, and  no contour-deforming renal mass. No ureterolithiasis or hydroureter. The urinary bladder is unremarkable. Stomach/Bowel: Stomach is within normal limits. No evidence of bowel wall thickening or dilatation. Partial colectomy with sigmoid reanastomosis. No pneumatosis. Appendix appears normal. Vascular/Lymphatic: No mesenteric venous gas. No portal venous gas. Nonspecific flattened inferior vena cava. No abdominal aorta or iliac aneurysm. Mild atherosclerotic plaque of the aorta and its branches. Prominent but nonenlarged retroperitoneal lymph nodes in the setting of retroperitoneal fat stranding. No abdominal, pelvic, or inguinal lymphadenopathy. Reproductive: Uterus and bilateral adnexa are unremarkable. Other: Fat stranding and trace free fluid within the upper abdomen with fluid centered along the retroperitoneum in the region of the pancreas and kidneys. No intraperitoneal free gas. No organized fluid collection. Musculoskeletal: No abdominal wall hernia or abnormality. No suspicious lytic or blastic osseous lesions. No acute displaced fracture. Multilevel degenerative changes of the spine. IMPRESSION: 1. Nonspecific fat stranding and trace free fluid within the upper abdomen with  fluid centered along the retroperitoneum in the region of the pancreas and kidneys. Associated prominent retroperitoneal lymph nodes. Findings of unclear etiology. Differential diagnosis includes infection/inflammation (such as acute pancreatitis a renal infection), malignancy, thrombosis. Limited evaluation on this noncontrast study. 2. Hepatomegaly and marked hepatic steatosis. 3. Small hiatal hernia. Electronically Signed   By: Iven Finn M.D.   On: 02/06/2021 02:08    Assessment & Plan:   [name] was seen today for hypertension and hypothyroidism.  Diagnoses and all orders for this visit:  Primary hypertension- Her blood pressure is overcontrolled and she is symptomatic and has a low potassium level.  Will discontinue indapamide. -     Basic metabolic panel; Future -     Basic metabolic panel -     potassium chloride SA (KLOR-CON) 20 MEQ tablet; Take 1 tablet (20 mEq total) by mouth 2 (two) times daily.  Acquired hypothyroidism- Her TSH is in the normal range.  She will stay on the current dose of levothyroxine. -     TSH; Future -     TSH  Diuretic-induced hypokalemia -     Basic metabolic panel; Future -     Basic metabolic panel -     potassium chloride SA (KLOR-CON) 20 MEQ tablet; Take 1 tablet (20 mEq total) by mouth 2 (two) times daily.  Hyperglycemia -     Basic metabolic panel; Future -     Hemoglobin A1c; Future -     Hemoglobin A1c -     Basic metabolic panel  E31 deficiency -     Folate; Future -     Folate -     cyanocobalamin (,VITAMIN B-12,) 1000 MCG/ML injection; Inject 1 mL (1,000 mcg total) into the muscle every 30 (thirty) days.  Folate deficiency -     folic acid (FOLVITE) 1 MG tablet; Take 1 tablet (1 mg total) by mouth daily.  Atherosclerosis of aorta ([place])- Risk factor modifications addressed.  NASH (nonalcoholic steatohepatitis)- She is working on her lifestyle modifications.  Retroperitoneal lymphadenopathy- Will screen for malignant  transportation with a pet scan. -     NM PET Image Initial (PI) Skull Base To Thigh; Future  Localized enlarged lymph nodes  I have discontinued [place] indapamide. I have also changed her potassium chloride SA. Additionally, I am having her start on folic acid. Lastly, I am having her maintain her cholecalciferol, rizatriptan, Calcium Carbonate-Vit D-Min (CALCIUM 1200 PO), acetaminophen, SYRINGE-NEEDLE (DISP) 3 ML, allopurinol, ALPRAZolam, amLODipine, nebivolol, levothyroxine, colchicine, colestipol, and cyanocobalamin.  Meds ordered  this encounter  Medications   folic acid (FOLVITE) 1 MG tablet    Sig: Take 1 tablet (1 mg total) by mouth daily.    Dispense:  90 tablet    Refill:  1   potassium chloride SA (KLOR-CON) 20 MEQ tablet    Sig: Take 1 tablet (20 mEq total) by mouth 2 (two) times daily.    Dispense:  180 tablet    Refill:  0   cyanocobalamin (,VITAMIN B-12,) 1000 MCG/ML injection    Sig: Inject 1 mL (1,000 mcg total) into the muscle every 30 (thirty) days.    Dispense:  4 mL    Refill:  3      Follow-up: No follow-ups on file.  [name], MD

## 2021-04-11 DIAGNOSIS — I700 Atherosclerosis of aorta: Secondary | ICD-10-CM | POA: Insufficient documentation

## 2021-04-13 DIAGNOSIS — K7581 Nonalcoholic steatohepatitis (NASH): Secondary | ICD-10-CM | POA: Insufficient documentation

## 2021-04-15 ENCOUNTER — Telehealth: Payer: Self-pay | Admitting: Internal Medicine

## 2021-04-15 NOTE — Telephone Encounter (Signed)
Patient was sent to the triage nurse for calling of SOB. Pt has a history of COPD. Triage nurse recommend going to the ED now but pt refused and would rather see someone in the office.  Please advise.

## 2021-04-16 ENCOUNTER — Telehealth: Payer: Medicare Other | Admitting: Internal Medicine

## 2021-04-16 NOTE — Telephone Encounter (Signed)
Pt stated that she was not having SOB but was having a very deep cough in addition to a runny nose with clear fluid. She stated at times the cough made her incontinent. Please advise if you would like her to be placed in a virtual slot with another provider.

## 2021-04-17 ENCOUNTER — Telehealth: Payer: Medicare Other | Admitting: Internal Medicine

## 2021-04-22 ENCOUNTER — Other Ambulatory Visit: Payer: Self-pay | Admitting: Internal Medicine

## 2021-04-28 ENCOUNTER — Other Ambulatory Visit: Payer: Self-pay | Admitting: Internal Medicine

## 2021-04-28 DIAGNOSIS — F418 Other specified anxiety disorders: Secondary | ICD-10-CM

## 2021-05-13 ENCOUNTER — Encounter: Payer: Self-pay | Admitting: Internal Medicine

## 2021-05-13 ENCOUNTER — Ambulatory Visit (INDEPENDENT_AMBULATORY_CARE_PROVIDER_SITE_OTHER): Payer: Medicare Other

## 2021-05-13 ENCOUNTER — Other Ambulatory Visit: Payer: Self-pay

## 2021-05-13 ENCOUNTER — Ambulatory Visit (INDEPENDENT_AMBULATORY_CARE_PROVIDER_SITE_OTHER): Payer: Medicare Other | Admitting: Internal Medicine

## 2021-05-13 ENCOUNTER — Telehealth: Payer: Self-pay | Admitting: Internal Medicine

## 2021-05-13 VITALS — BP 140/88 | HR 89 | Temp 97.9°F | Ht 65.0 in | Wt 204.0 lb

## 2021-05-13 DIAGNOSIS — M1711 Unilateral primary osteoarthritis, right knee: Secondary | ICD-10-CM | POA: Insufficient documentation

## 2021-05-13 DIAGNOSIS — M25562 Pain in left knee: Secondary | ICD-10-CM | POA: Insufficient documentation

## 2021-05-13 DIAGNOSIS — I10 Essential (primary) hypertension: Secondary | ICD-10-CM

## 2021-05-13 MED ORDER — HYDROCODONE-ACETAMINOPHEN 5-325 MG PO TABS: 1.0000 | ORAL_TABLET | Freq: Four times a day (QID) | ORAL | 0 refills | Status: DC | PRN

## 2021-05-13 NOTE — Assessment & Plan Note (Signed)
Minor, little to no pain, continue to monitor with tylenol prn

## 2021-05-13 NOTE — Patient Instructions (Signed)
Please take all new medication as prescribed - the pain medication  Please continue all other medications as before, and refills have been done if requested.  Please have the pharmacy call with any other refills you may need.  Please keep your appointments with your specialists as you may have planned  You will be contacted regarding the referral for: sports medicine  Please go to the XRAY Department in the first floor for the x-ray testing  You will be contacted by phone if any changes need to be made immediately.  Otherwise, you will receive a letter about your results with an explanation, but please check with MyChart first.  Please remember to sign up for MyChart if you have not done so, as this will be important to you in the future with finding out test results, communicating by private email, and scheduling acute appointments online when needed.

## 2021-05-13 NOTE — Assessment & Plan Note (Signed)
Stable overall, to continue current med tx - bystolic

## 2021-05-13 NOTE — Assessment & Plan Note (Signed)
Acute pain medial aspect with large swelling, reduced ROM after ? Torque injury now mod to severe; ok for hydrocodone 5 325 pr, left knee film, and refer sport medicine for u/ s- cant r/o medial meniscus tear, vs djd vs gout (the latter unlikely );

## 2021-05-13 NOTE — Progress Notes (Signed)
Patient ID: Valerie Jackson, female   DOB: 1946/05/30, 75 y.o.   MRN: [id number]        Chief Complaint: follow up HTN, acute left knee pain, right knee arthritis       HPI:  Valerie Jackson is a 75 y.o. female here overall doing ok, but had severe onset pain to right knee with swelling 8/10 constant, sharp, worse to walk, better to sit after possibly twisting the knee with getting in and out of bed.  No falls, trauma, fever, and no giveaways. Has hx of gout on colchicine but quiet recently to the left foot.   Has stairs in the home which she really tries to avoid now.  Has hx fo arthritis and grating to the right knee but essentially no pain, so this is dramatically worse than baseline.  Did have a few of duaghters left over narcotic at home which helped.  No recent ortho, films, MRI or other procedures.  BP at home has been < 140/90, good compliance with meds, not taking the amlodipine as is not needed per pt.  Pt denies chest pain, increased sob or doe, wheezing, orthopnea, PND, increased LE swelling, palpitations, dizziness or syncope.   Pt denies polydipsia, polyuria, or new focl neuro s/s.        Wt Readings from Last 3 Encounters:  05/13/21 204 lb (92.5 kg)  04/10/21 202 lb (91.6 kg)  04/10/21 202 lb (91.6 kg)   BP Readings from Last 3 Encounters:  05/13/21 140/88  04/10/21 116/68  04/10/21 118/62         Past Medical History:  Diagnosis Date   Arthritis    fingers   Blood clot in vein 1968   Cataracts, bilateral    CHF (congestive heart failure) (HCC)    Chronic kidney disease    CKD stage 2   COPD (chronic obstructive pulmonary disease) (HCC)    Depression    Diverticulitis 09/2013   with perforation   Dizziness    with decreased BP readings or bending over   Floaters    GERD (gastroesophageal reflux disease)    H/O hiatal hernia    Headache(784.0)    migraine every 5- to 6 years   Heart murmur since age 53   mild   Hepatitis age 26 or 29   had heapatitis, not sure which  kind   History of blood transfusion 1965   History of frequent urinary tract infections    Hypercholesteremia    Hypertension    Hypothyroid    Obesity    Pericolonic abscess 2015   Pneumonia    Sickle cell trait ([place])    Past Surgical History:  Procedure Laterality Date   CHOLECYSTECTOMY  2002   COLON RESECTION N/A 10/11/2013   Procedure: EXPLORATORY LAPAROTOMY WITH RECTOSIGMOID COLECTOMY AND END COLOSTOMY;  Surgeon: Stark Klein, MD;  Location: [facility];  Service: General;  Laterality: N/A;   COLOSTOMY TAKEDOWN N/A 02/27/2014   Procedure: COLOSTOMY TAKEDOWN [name] HERNIA REPAIR;  Surgeon: Stark Klein, MD;  Location: [facility];  Service: General;  Laterality: N/A;   INCISIONAL HERNIA REPAIR N/A 03/04/2016   Procedure: LAPAROSCOPIC INCISIONAL HERNIA WITH MESH;  Surgeon: Ralene Ok, MD;  Location: [facility];  Service: General;  Laterality: N/A;   LAPAROSCOPIC LYSIS OF ADHESIONS N/A 03/04/2016   Procedure: LAPAROSCOPIC LYSIS OF ADHESIONS;  Surgeon: Ralene Ok, MD;  Location: [facility];  Service: General;  Laterality: N/A;   RIGHT HEART CATH N/A 06/24/2017   Procedure:  RIGHT HEART CATH;  Surgeon: Jolaine Artist, MD;  Location: [facility];  Service: Cardiovascular;  Laterality: N/A;   TONSILLECTOMY  1952    reports that she quit smoking about 8 years ago. Her smoking use included cigarettes. She has a 26.00 pack-year smoking history. She has never used smokeless tobacco. She reports that she does not drink alcohol and does not use drugs. family history includes Aneurysm in her mother; Arthritis in her sister; Breast cancer (age of onset: 75) in her maternal grandmother; Cancer in her mother and another family member; Glaucoma in her brother; Hyperlipidemia in her brother and sister; Hypertension in her maternal grandmother, mother, and sister; Kidney disease in her sister; Stroke in her sister; Thyroid cancer in her daughter. Allergies  Allergen Reactions   Crestor  [Rosuvastatin] Other (See Comments)    Myalgias    Estrogens     Blood clot in leg   Current Outpatient Medications on File Prior to Visit  Medication Sig Dispense Refill   acetaminophen (TYLENOL) 500 MG tablet Take 1 tablet (500 mg total) by mouth every 6 (six) hours as needed. 30 tablet 0   allopurinol (ZYLOPRIM) 100 MG tablet Take 1 tablet (100 mg total) by mouth daily. 90 tablet 1   ALPRAZolam (XANAX) 0.5 MG tablet TAKE ONE TABLET BY MOUTH AT BEDTIME AS NEEDED FOR ANXIETY 30 tablet 2   Calcium Carbonate-Vit D-Min (CALCIUM 1200 PO) Take 1 tablet by mouth daily.     cholecalciferol (VITAMIN D) 1000 UNITS tablet Take 2,000 Units by mouth daily.      colchicine 0.6 MG tablet TAKE ONE TABLET BY MOUTH ONE TIME DAILY 30 tablet 0   colestipol (COLESTID) 1 g tablet Take 1 tablet (1 g total) by mouth 2 (two) times daily. 60 tablet 11   cyanocobalamin (,VITAMIN B-12,) 1000 MCG/ML injection Inject 1 mL (1,000 mcg total) into the muscle every 30 (thirty) days. 4 mL 3   folic acid (FOLVITE) 1 MG tablet Take 1 tablet (1 mg total) by mouth daily. 90 tablet 1   levothyroxine (SYNTHROID) 125 MCG tablet Take 1 tablet (125 mcg total) by mouth daily. (Patient taking differently: Take 75 mcg by mouth daily.) 90 tablet 0   nebivolol (BYSTOLIC) 5 MG tablet Take 1 tablet (5 mg total) by mouth daily. 90 tablet 0   potassium chloride SA (KLOR-CON) 20 MEQ tablet Take 1 tablet (20 mEq total) by mouth 2 (two) times daily. 180 tablet 0   rizatriptan (MAXALT-MLT) 10 MG disintegrating tablet Take 1 tablet (10 mg total) by mouth as needed for migraine. May repeat in 2 hours if needed 30 tablet 1   SYRINGE-NEEDLE, DISP, 3 ML 25G X 5/8" 3 ML MISC 1 each by Does not apply route every 30 (thirty) days. 50 each 0   No current facility-administered medications on file prior to visit.        ROS:  All others reviewed and negative.  Objective        PE:  BP 140/88 (BP Location: Left Arm, Patient Position: Sitting, Cuff Size:  Large)   Pulse 89   Temp 97.9 F (36.6 C) (Oral)   Ht 5' 5"  (1.651 m)   Wt 204 lb (92.5 kg)   SpO2 98%   BMI 33.95 kg/m                 Constitutional: Pt appears in NAD               HENT:  Head: NCAT.                Right Ear: External ear normal.                 Left Ear: External ear normal.                Eyes: . Pupils are equal, round, and reactive to light. Conjunctivae and EOM are normal               Nose: without d/c or deformity               Neck: Neck supple. Gross normal ROM               Cardiovascular: Normal rate and regular rhythm.                 Pulmonary/Chest: Effort normal and breath sounds without rales or wheezing.                Right knee with mild crepitus only               Left knee with medial tender over te joint line and warm with overall 2+ knee effusion and reduced ROM               Neurological: Pt is alert. At baseline orientation, motor grossly intact               Skin: Skin is warm. No rashes, no other new lesions, LE edema - none               Psychiatric: Pt behavior is normal without agitation   Micro: none  Cardiac tracings I have personally interpreted today:  none  Pertinent Radiological findings (summarize): none   Lab Results  Component Value Date   WBC 8.8 02/06/2021   HGB 13.2 02/06/2021   HCT 39.1 02/06/2021   PLT 227 02/06/2021   GLUCOSE 136 (H) 04/10/2021   CHOL 256 (H) 06/17/2020   TRIG 210.0 (H) 06/17/2020   HDL 57.20 06/17/2020   LDLDIRECT 165.0 06/17/2020   LDLCALC 130 (H) 01/16/2019   ALT 44 02/06/2021   AST 32 02/06/2021   NA 140 04/10/2021   K 3.2 (L) 04/10/2021   CL 103 04/10/2021   CREATININE 1.86 (H) 04/10/2021   BUN 15 04/10/2021   CO2 27 04/10/2021   TSH 1.70 04/10/2021   INR 1.02 06/24/2017   HGBA1C 5.0 04/10/2021   Assessment/Plan:  Valerie Jackson is a 75 y.o. Black or African American [2] female with  has a past medical history of Arthritis, Blood clot in vein (1968), Cataracts, bilateral, CHF  (congestive heart failure) ([place]), Chronic kidney disease, COPD (chronic obstructive pulmonary disease) ([place]), Depression, Diverticulitis (09/2013), Dizziness, Floaters, GERD (gastroesophageal reflux disease), H/O hiatal hernia, Headache(784.0), Heart murmur (since age 18), Hepatitis (age 22 or 60), History of blood transfusion (1965), History of frequent urinary tract infections, Hypercholesteremia, Hypertension, Hypothyroid, Obesity, Pericolonic abscess (2015), Pneumonia, and Sickle cell trait ([place]).  Hypertension Stable overall, to continue current med tx - bystolic  Arthritis of right knee Minor, little to no pain, continue to monitor with tylenol prn  Acute pain of left knee Acute pain medial aspect with large swelling, reduced ROM after ? Torque injury now mod to severe; ok for hydrocodone 5 325 pr, left knee film, and refer sport medicine for u/ s- cant r/o medial meniscus tear, vs djd vs gout (the latter unlikely );  Followup:  Return if symptoms worsen or fail to improve.  Cathlean Cower, MD 05/13/2021 2:43 PM [facility]

## 2021-05-15 ENCOUNTER — Ambulatory Visit: Payer: Self-pay

## 2021-05-15 ENCOUNTER — Encounter: Payer: Self-pay | Admitting: Internal Medicine

## 2021-05-15 ENCOUNTER — Other Ambulatory Visit: Payer: Self-pay

## 2021-05-15 ENCOUNTER — Ambulatory Visit (INDEPENDENT_AMBULATORY_CARE_PROVIDER_SITE_OTHER): Payer: Medicare Other | Admitting: Sports Medicine

## 2021-05-15 VITALS — BP 118/82 | HR 80 | Ht 65.0 in | Wt 205.4 lb

## 2021-05-15 DIAGNOSIS — M25562 Pain in left knee: Secondary | ICD-10-CM | POA: Diagnosis not present

## 2021-05-15 DIAGNOSIS — M1712 Unilateral primary osteoarthritis, left knee: Secondary | ICD-10-CM | POA: Diagnosis not present

## 2021-05-15 NOTE — Progress Notes (Signed)
Benito Mccreedy D.[place] Phone: [phone]   Assessment and Plan:   I, Peterson Lombard, PhD, LAT, ATC, am acting as a Education administrator for [employer], DO, CAQSM.   1. Acute pain of left knee 2. Primary osteoarthritis of left knee -Acute on chronic, no improvement, initial sports medicine visit - Likely acute flare of medial OA.  Meniscal pathology may be contributing although no evidence of large tear based off of no traumatic event, physical exam.  Unlikely gout as patient has never had gout flare in her knee, no erythema/warmth on physical exam - Patient elected to have aspiration and injection at today's visit.  Tolerated well per procedure note below - RICE therapy - HEP focused on ROM and strengthening. - Tylenol/NSAIDs as needed for pain control.  - [place] LIMITED JOINT SPACE STRUCTURES LOW LEFT(NO LINKED CHARGES); Future - [place] LIMITED JOINT SPACE STRUCTURES LOW LEFT(NO LINKED CHARGES); Future     Procedure: Ultrasound Guided Knee Joint Injection/Aspiration Side: left  Indication: acute knee pain/effusion  After PARQ discussed and consent was given verbally. The site was cleaned with alcohol prep. An ultrasound transducer was placed on the anterior knee proximal to the patella.  The suprapatellar pouch of the knee was identified. A needle was introduced to the pouch under ultrasound guidance with sterile technique. 34m clear, straw colored fluid aspirated. The needle was exchanged and  240mof 1% lidocaine without epinephrine and 40 mg of kenalog was injected. This was well tolerated and resulted in relief.  Needle removed and dressing placed and post injection instructions were given including  a discussion of likely return of pain today after the anesthetic wears off (with the possibility of worsened pain) until the steroid starts to work in 1-3 days.   Pt was advised to call or return to clinic if these symptoms  worsen or fail to improve as anticipated.    All pertinent previous records reviewed prior to and during visit.    Follow Up: 4 to 6 weeks for reevaluation.  If no improvement, patient may benefit from formal PT versus advanced imaging versus prolonged NSAID course versus HA injection    Subjective:    Chief Complaint: L knee pain  HPI: Pt is a [age]/o female c/o L knee pain ongoing for about 2 weeks w/ no known MOI. Pt locates pain to anterior-medial aspect of the L knee.  Patient states she has had knee pain for several years, however this flare of pain over the last 2 weeks has made it difficult for her to do her daily activities including going up and down stairs.  Occasionally will have episodes where it feels like her knee is in a give out from under her.  Denies locking, numbness/tingling/weakness of extremities.  Never had corticosteroid injections in knees before.  L knee swelling: yes Mechanical symptoms: no Aggravates: weightbearing, stairs Treatments tried: tylenol w/ codeine, elevation, hydrocodone  Dx imaging: 05/13/21 L knee XR  Relevant Historical Information: gout to left 2nd, never had flare in her knees.  Last uric acid level was still above normal limits.  Additional pertinent review of systems negative.   Current Outpatient Medications:    acetaminophen (TYLENOL) 500 MG tablet, Take 1 tablet (500 mg total) by mouth every 6 (six) hours as needed., Disp: 30 tablet, Rfl: 0   allopurinol (ZYLOPRIM) 100 MG tablet, Take 1 tablet (100 mg total) by mouth daily., Disp: 90 tablet, Rfl: 1  ALPRAZolam (XANAX) 0.5 MG tablet, TAKE ONE TABLET BY MOUTH AT BEDTIME AS NEEDED FOR ANXIETY, Disp: 30 tablet, Rfl: 2   Calcium Carbonate-Vit D-Min (CALCIUM 1200 PO), Take 1 tablet by mouth daily., Disp: , Rfl:    cholecalciferol (VITAMIN D) 1000 UNITS tablet, Take 2,000 Units by mouth daily. , Disp: , Rfl:    colchicine 0.6 MG tablet, TAKE ONE TABLET BY MOUTH ONE TIME DAILY, Disp: 30  tablet, Rfl: 0   colestipol (COLESTID) 1 g tablet, Take 1 tablet (1 g total) by mouth 2 (two) times daily., Disp: 60 tablet, Rfl: 11   cyanocobalamin (,VITAMIN B-12,) 1000 MCG/ML injection, Inject 1 mL (1,000 mcg total) into the muscle every 30 (thirty) days., Disp: 4 mL, Rfl: 3   folic acid (FOLVITE) 1 MG tablet, Take 1 tablet (1 mg total) by mouth daily., Disp: 90 tablet, Rfl: 1   HYDROcodone-acetaminophen (NORCO/VICODIN) 5-325 MG tablet, Take 1 tablet by mouth every 6 (six) hours as needed., Disp: 30 tablet, Rfl: 0   levothyroxine (SYNTHROID) 125 MCG tablet, Take 1 tablet (125 mcg total) by mouth daily. (Patient taking differently: Take 75 mcg by mouth daily.), Disp: 90 tablet, Rfl: 0   nebivolol (BYSTOLIC) 5 MG tablet, Take 1 tablet (5 mg total) by mouth daily., Disp: 90 tablet, Rfl: 0   potassium chloride SA (KLOR-CON) 20 MEQ tablet, Take 1 tablet (20 mEq total) by mouth 2 (two) times daily., Disp: 180 tablet, Rfl: 0   rizatriptan (MAXALT-MLT) 10 MG disintegrating tablet, Take 1 tablet (10 mg total) by mouth as needed for migraine. May repeat in 2 hours if needed, Disp: 30 tablet, Rfl: 1   SYRINGE-NEEDLE, DISP, 3 ML 25G X 5/8" 3 ML MISC, 1 each by Does not apply route every 30 (thirty) days., Disp: 50 each, Rfl: 0   Objective:     There were no vitals filed for this visit.    There is no height or weight on file to calculate BMI.    Physical Exam:    General:  awake, alert oriented, no acute distress nontoxic Skin: no suspicious lesions or rashes Neuro:sensation intact, no deficits, strength 5/5 with no deficits, no atrophy, normal muscle tone Psych: No signs of anxiety, depression or other mood disorder  Knee: Moderate swelling No deformity Positive fluid wave, joint milking ROM Flex 100, Ext 10 TTP medial femoral condyle NTTP over the quad tendon, medial fem condyle, lat fem condyle, patella, plica, patella tendon, tibial tuberostiy, fibular head, posterior fossa, pes anserine  bursa, gerdy's tubercle, lateral jt line Neg anterior and posterior drawer Neg lachman Neg sag sign Negative varus stress Negative valgus stress Negative McMurray Unable to perform Thessaly due to instability  Gait ataxic favoring right leg    Electronically signed by:  Benito Mccreedy D.[facility] 7:51 AM 05/15/21

## 2021-05-15 NOTE — Patient Instructions (Addendum)
Thank you for coming in today.   You received a steroid injection today and fluid drained from your knee. Seek immediate medical attention if the joint becomes red, extremely painful, or is oozing fluid.   Please complete the exercises that the athletic trainer went over with you:  View at [email or web address] using code: [email or web address]  Tylenol and ibuprofen as needed for pain relief.  Recheck back in 4-6 weeks, unless you are not any better within 2 weeks.

## 2021-05-17 NOTE — Telephone Encounter (Signed)
PA started on 05/13/21  Key: [facility]

## 2021-05-20 NOTE — Telephone Encounter (Signed)
PA has been intiated

## 2021-05-21 NOTE — Telephone Encounter (Signed)
Approved until 09/13/2021

## 2021-05-28 ENCOUNTER — Other Ambulatory Visit: Payer: Self-pay | Admitting: Internal Medicine

## 2021-06-03 ENCOUNTER — Other Ambulatory Visit: Payer: Self-pay | Admitting: Internal Medicine

## 2021-06-03 DIAGNOSIS — E039 Hypothyroidism, unspecified: Secondary | ICD-10-CM

## 2021-06-03 MED ORDER — COLCHICINE 0.6 MG PO TABS: 0.6000 mg | ORAL_TABLET | Freq: Every day | ORAL | 0 refills | Status: DC

## 2021-06-03 MED ORDER — LEVOTHYROXINE SODIUM 125 MCG PO TABS: 125.0000 ug | ORAL_TABLET | Freq: Every day | ORAL | 0 refills | Status: DC

## 2021-06-12 ENCOUNTER — Ambulatory Visit (INDEPENDENT_AMBULATORY_CARE_PROVIDER_SITE_OTHER): Payer: Medicare Other | Admitting: Sports Medicine

## 2021-06-12 ENCOUNTER — Other Ambulatory Visit: Payer: Self-pay

## 2021-06-12 VITALS — BP 130/86 | HR 105 | Ht 65.0 in | Wt 201.0 lb

## 2021-06-12 DIAGNOSIS — M25562 Pain in left knee: Secondary | ICD-10-CM

## 2021-06-12 DIAGNOSIS — M1712 Unilateral primary osteoarthritis, left knee: Secondary | ICD-10-CM | POA: Diagnosis not present

## 2021-06-12 NOTE — Progress Notes (Signed)
Valerie Jackson D.[place] Phone: [phone]   Assessment and Plan:     1. Primary osteoarthritis of left knee 2. Acute pain of left knee -Acute on chronic, improving, subsequent sports medicine visit - Mild to moderate improvement in pain consistent with acute flare of osteoarthritis - Continue HEP -Continue prescription pain medication including Norco as needed for pain control - Can follow-up for additional CSI versus discussing HA injection if she has additional flare.  Would recommend waiting until after 08/14/2021 for additional CSI  Pertinent previous records reviewed include previous office note, knee x-ray   Follow Up: As needed if no improvement or worsening of symptoms.Can follow-up for additional CSI versus discussing HA injection if she has additional flare.  Would recommend waiting until after 08/14/2021 for additional CSI   Subjective:   I, Valerie Jackson, am serving as a scribe for Dr. Glennon Mac  Chief Complaint: Left knee pain   HPI:   05/15/21 HPI: Pt is a 75 y/o female c/o L knee pain ongoing for about 2 weeks w/ no known MOI. Pt locates pain to anterior-medial aspect of the L knee.  Patient states she has had knee pain for several years, however this flare of pain over the last 2 weeks has made it difficult for her to do her daily activities including going up and down stairs.  Occasionally will have episodes where it feels like her knee is in a give out from under her.  Denies locking, numbness/tingling/weakness of extremities.  Never had corticosteroid injections in knees before.  06/12/21 Patient states the injection did help at first but the cat caused her to move quickly and it irritated her knee and it is hurting again. Patient states the ROM is not much better states that the knee feels like it did at the last visits.   Relevant Historical Information: gout to left 2nd, never  had flare in her knees.  Last uric acid level was still above normal limits.  Additional pertinent review of systems negative.   Current Outpatient Medications:    acetaminophen (TYLENOL) 500 MG tablet, Take 1 tablet (500 mg total) by mouth every 6 (six) hours as needed., Disp: 30 tablet, Rfl: 0   allopurinol (ZYLOPRIM) 100 MG tablet, Take 1 tablet (100 mg total) by mouth daily., Disp: 90 tablet, Rfl: 1   ALPRAZolam (XANAX) 0.5 MG tablet, TAKE ONE TABLET BY MOUTH AT BEDTIME AS NEEDED FOR ANXIETY, Disp: 30 tablet, Rfl: 2   Calcium Carbonate-Vit D-Min (CALCIUM 1200 PO), Take 1 tablet by mouth daily., Disp: , Rfl:    cholecalciferol (VITAMIN D) 1000 UNITS tablet, Take 2,000 Units by mouth daily. , Disp: , Rfl:    colchicine 0.6 MG tablet, Take 1 tablet (0.6 mg total) by mouth daily., Disp: 30 tablet, Rfl: 0   colestipol (COLESTID) 1 g tablet, Take 1 tablet (1 g total) by mouth 2 (two) times daily., Disp: 60 tablet, Rfl: 11   cyanocobalamin (,VITAMIN B-12,) 1000 MCG/ML injection, Inject 1 mL (1,000 mcg total) into the muscle every 30 (thirty) days., Disp: 4 mL, Rfl: 3   folic acid (FOLVITE) 1 MG tablet, Take 1 tablet (1 mg total) by mouth daily., Disp: 90 tablet, Rfl: 1   HYDROcodone-acetaminophen (NORCO/VICODIN) 5-325 MG tablet, Take 1 tablet by mouth every 6 (six) hours as needed., Disp: 30 tablet, Rfl: 0   indapamide (LOZOL) 1.25 MG tablet, TAKE ONE TABLET BY MOUTH ONE TIME DAILY,  Disp: 90 tablet, Rfl: 0   levothyroxine (SYNTHROID) 125 MCG tablet, Take 1 tablet (125 mcg total) by mouth daily., Disp: 90 tablet, Rfl: 0   nebivolol (BYSTOLIC) 5 MG tablet, Take 1 tablet (5 mg total) by mouth daily., Disp: 90 tablet, Rfl: 0   potassium chloride SA (KLOR-CON) 20 MEQ tablet, Take 1 tablet (20 mEq total) by mouth 2 (two) times daily., Disp: 180 tablet, Rfl: 0   rizatriptan (MAXALT-MLT) 10 MG disintegrating tablet, Take 1 tablet (10 mg total) by mouth as needed for migraine. May repeat in 2 hours if  needed, Disp: 30 tablet, Rfl: 1   SYRINGE-NEEDLE, DISP, 3 ML 25G X 5/8" 3 ML MISC, 1 each by Does not apply route every 30 (thirty) days., Disp: 50 each, Rfl: 0   Objective:     Vitals:   06/12/21 0949  BP: 130/86  Pulse: (!) 105  SpO2: 98%  Weight: 201 lb (91.2 kg)  Height: 5' 5"  (1.651 m)      Body mass index is 33.45 kg/m.    Physical Exam:    General:  awake, alert oriented, no acute distress nontoxic Skin: no suspicious lesions or rashes Neuro:sensation intact, no deficits, strength 5/5 with no deficits, no atrophy, normal muscle tone Psych: No signs of anxiety, depression or other mood disorder  Left knee: No swelling No deformity Negative fluid wave, joint milking ROM Flex 100, Ext 5 TTP medial femoral condyle mildly NTTP over the quad tendon, medial fem condyle, lat fem condyle, patella, plica, patella tendon, tibial tuberostiy, fibular head, posterior fossa, pes anserine bursa, gerdy's tubercle, lateral jt line Neg anterior and posterior drawer Neg lachman Neg sag sign Negative varus stress Negative valgus stress Negative McMurray    Gait with short walks cycle but no longer favoring right leg     Electronically signed by:  Valerie Jackson D.[facility] 10:09 AM 06/12/21

## 2021-06-12 NOTE — Patient Instructions (Signed)
Good to see you  Continue home exercises Continue pain medications as needed See me again as needed

## 2021-06-23 ENCOUNTER — Ambulatory Visit (HOSPITAL_COMMUNITY)
Admission: RE | Admit: 2021-06-23 | Discharge: 2021-06-23 | Disposition: A | Discharge status: Home / Self Care | Payer: Medicare Other | Source: Ambulatory Visit | Attending: Internal Medicine | Admitting: Internal Medicine

## 2021-06-23 ENCOUNTER — Other Ambulatory Visit: Payer: Self-pay

## 2021-06-23 DIAGNOSIS — K760 Fatty (change of) liver, not elsewhere classified: Secondary | ICD-10-CM | POA: Diagnosis not present

## 2021-06-23 DIAGNOSIS — R590 Localized enlarged lymph nodes: Secondary | ICD-10-CM | POA: Diagnosis not present

## 2021-06-23 DIAGNOSIS — I700 Atherosclerosis of aorta: Secondary | ICD-10-CM | POA: Diagnosis not present

## 2021-06-23 DIAGNOSIS — Z9049 Acquired absence of other specified parts of digestive tract: Secondary | ICD-10-CM | POA: Diagnosis not present

## 2021-06-23 LAB — GLUCOSE, CAPILLARY: Glucose-Capillary: 112 mg/dL — ABNORMAL HIGH (ref 70–99)

## 2021-06-23 MED ORDER — FLUDEOXYGLUCOSE F - 18 (FDG) INJECTION
10.0000 | Freq: Once | INTRAVENOUS | Status: AC
  Administered 2021-06-23: 9.95 via INTRAVENOUS

## 2021-07-01 ENCOUNTER — Other Ambulatory Visit: Payer: Self-pay | Admitting: Internal Medicine

## 2021-07-01 DIAGNOSIS — E039 Hypothyroidism, unspecified: Secondary | ICD-10-CM

## 2021-07-07 MED ORDER — COLCHICINE 0.6 MG PO TABS: 0.6000 mg | ORAL_TABLET | Freq: Every day | ORAL | 0 refills | Status: DC

## 2021-07-07 MED ORDER — LEVOTHYROXINE SODIUM 125 MCG PO TABS: 125.0000 ug | ORAL_TABLET | Freq: Every day | ORAL | 0 refills | Status: DC

## 2021-07-07 MED ORDER — INDAPAMIDE 1.25 MG PO TABS: 1.2500 mg | ORAL_TABLET | Freq: Every day | ORAL | 0 refills | Status: DC

## 2021-07-14 ENCOUNTER — Encounter: Payer: Self-pay | Admitting: Internal Medicine

## 2021-07-14 ENCOUNTER — Ambulatory Visit (INDEPENDENT_AMBULATORY_CARE_PROVIDER_SITE_OTHER): Payer: Medicare Other | Admitting: Internal Medicine

## 2021-07-14 ENCOUNTER — Other Ambulatory Visit: Payer: Self-pay

## 2021-07-14 VITALS — BP 136/84 | HR 82 | Temp 98.1°F | Ht 65.0 in | Wt 206.0 lb

## 2021-07-14 DIAGNOSIS — I10 Essential (primary) hypertension: Secondary | ICD-10-CM | POA: Diagnosis not present

## 2021-07-14 DIAGNOSIS — E785 Hyperlipidemia, unspecified: Secondary | ICD-10-CM

## 2021-07-14 DIAGNOSIS — E039 Hypothyroidism, unspecified: Secondary | ICD-10-CM

## 2021-07-14 DIAGNOSIS — E213 Hyperparathyroidism, unspecified: Secondary | ICD-10-CM

## 2021-07-14 DIAGNOSIS — R739 Hyperglycemia, unspecified: Secondary | ICD-10-CM | POA: Diagnosis not present

## 2021-07-14 DIAGNOSIS — E538 Deficiency of other specified B group vitamins: Secondary | ICD-10-CM | POA: Diagnosis not present

## 2021-07-14 DIAGNOSIS — Z23 Encounter for immunization: Secondary | ICD-10-CM

## 2021-07-14 DIAGNOSIS — E876 Hypokalemia: Secondary | ICD-10-CM | POA: Diagnosis not present

## 2021-07-14 DIAGNOSIS — T502X5A Adverse effect of carbonic-anhydrase inhibitors, benzothiadiazides and other diuretics, initial encounter: Secondary | ICD-10-CM | POA: Diagnosis not present

## 2021-07-14 DIAGNOSIS — N182 Chronic kidney disease, stage 2 (mild): Secondary | ICD-10-CM | POA: Diagnosis not present

## 2021-07-14 LAB — BASIC METABOLIC PANEL
BUN: 15 mg/dL (ref 6–23)
CO2: 29 mEq/L (ref 19–32)
Calcium: 10 mg/dL (ref 8.4–10.5)
Chloride: 103 mEq/L (ref 96–112)
Creatinine, Ser: 1.88 mg/dL — ABNORMAL HIGH (ref 0.40–1.20)
GFR: 25.68 mL/min — ABNORMAL LOW (ref >60.00)
Glucose, Bld: 95 mg/dL (ref 70–99)
Potassium: 3.5 mEq/L (ref 3.5–5.1)
Sodium: 141 mEq/L (ref 135–145)

## 2021-07-14 LAB — LIPID PANEL
Cholesterol: 214 mg/dL — ABNORMAL HIGH (ref 0–200)
HDL: 60.1 mg/dL (ref >39.00)
LDL Cholesterol: 123 mg/dL — ABNORMAL HIGH (ref 0–99)
NonHDL: 153.69
Total CHOL/HDL Ratio: 4
Triglycerides: 152 mg/dL — ABNORMAL HIGH (ref 0.0–149.0)
VLDL: 30.4 mg/dL (ref 0.0–40.0)

## 2021-07-14 LAB — CBC WITH DIFFERENTIAL/PLATELET
Basophils Absolute: 0.1 10*3/uL (ref 0.0–0.1)
Basophils Relative: 0.7 % (ref 0.0–3.0)
Eosinophils Absolute: 0.2 10*3/uL (ref 0.0–0.7)
Eosinophils Relative: 2.4 % (ref 0.0–5.0)
HCT: 39.5 % (ref 36.0–46.0)
Hemoglobin: 13.3 g/dL (ref 12.0–15.0)
Lymphocytes Relative: 22.4 % (ref 12.0–46.0)
Lymphs Abs: 2.2 10*3/uL (ref 0.7–4.0)
MCHC: 33.7 g/dL (ref 30.0–36.0)
MCV: 91.9 fl (ref 78.0–100.0)
Monocytes Absolute: 0.5 10*3/uL (ref 0.1–1.0)
Monocytes Relative: 5 % (ref 3.0–12.0)
Neutro Abs: 6.9 10*3/uL (ref 1.4–7.7)
Neutrophils Relative %: 69.5 % (ref 43.0–77.0)
Platelets: 197 10*3/uL (ref 150.0–400.0)
RBC: 4.3 Mil/uL (ref 3.87–5.11)
RDW: 12.8 % (ref 11.5–15.5)
WBC: 9.9 10*3/uL (ref 4.0–10.5)

## 2021-07-14 LAB — TSH: TSH: 5.52 u[IU]/mL — ABNORMAL HIGH (ref 0.35–5.50)

## 2021-07-14 NOTE — Progress Notes (Signed)
Subjective:  Patient ID: [name], female    DOB: 1946/03/08  Age: 75 y.o. MRN: [id number]  CC: Hypertension  This visit occurred during the SARS-CoV-2 public health emergency.  Safety protocols were in place, including screening questions prior to the visit, additional usage of staff PPE, and extensive cleaning of exam room while observing appropriate contact time as indicated for disinfecting solutions.    HPI [place] Palladino presents for f/up -  She has felt well recently and offers no complaints.  Last summer she had some lymphadenopathy in the retroperitoneum in the setting of a UTI.  A recent PET scan revealed no lymphadenopathy.  She denies abdominal pain, nausea, vomiting, fever, chills, dysuria, or hematuria.  Outpatient Medications Prior to Visit  Medication Sig Dispense Refill   acetaminophen (TYLENOL) 500 MG tablet Take 1 tablet (500 mg total) by mouth every 6 (six) hours as needed. 30 tablet 0   allopurinol (ZYLOPRIM) 100 MG tablet Take 1 tablet (100 mg total) by mouth daily. 90 tablet 1   ALPRAZolam (XANAX) 0.5 MG tablet TAKE ONE TABLET BY MOUTH AT BEDTIME AS NEEDED FOR ANXIETY 30 tablet 2   Calcium Carbonate-Vit D-Min (CALCIUM 1200 PO) Take 1 tablet by mouth daily.     cholecalciferol (VITAMIN D) 1000 UNITS tablet Take 2,000 Units by mouth daily.      colchicine 0.6 MG tablet Take 1 tablet (0.6 mg total) by mouth daily. 30 tablet 0   colestipol (COLESTID) 1 g tablet Take 1 tablet (1 g total) by mouth 2 (two) times daily. 60 tablet 11   cyanocobalamin (,VITAMIN B-12,) 1000 MCG/ML injection Inject 1 mL (1,000 mcg total) into the muscle every 30 (thirty) days. 4 mL 3   folic acid (FOLVITE) 1 MG tablet Take 1 tablet (1 mg total) by mouth daily. 90 tablet 1   HYDROcodone-acetaminophen (NORCO/VICODIN) 5-325 MG tablet Take 1 tablet by mouth every 6 (six) hours as needed. 30 tablet 0   indapamide (LOZOL) 1.25 MG tablet Take 1 tablet (1.25 mg total) by mouth daily. 90 tablet 0    levothyroxine (SYNTHROID) 125 MCG tablet Take 1 tablet (125 mcg total) by mouth daily. 90 tablet 0   nebivolol (BYSTOLIC) 5 MG tablet Take 1 tablet (5 mg total) by mouth daily. 90 tablet 0   potassium chloride SA (KLOR-CON) 20 MEQ tablet Take 1 tablet (20 mEq total) by mouth 2 (two) times daily. 180 tablet 0   rizatriptan (MAXALT-MLT) 10 MG disintegrating tablet Take 1 tablet (10 mg total) by mouth as needed for migraine. May repeat in 2 hours if needed 30 tablet 1   SYRINGE-NEEDLE, DISP, 3 ML 25G X 5/8" 3 ML MISC 1 each by Does not apply route every 30 (thirty) days. 50 each 0   No facility-administered medications prior to visit.    ROS Review of Systems  Constitutional:  Negative for diaphoresis, fatigue and unexpected weight change.  HENT: Negative.    Eyes: Negative.   Respiratory:  Negative for cough, chest tightness, shortness of breath and wheezing.   Cardiovascular:  Negative for chest pain, palpitations and leg swelling.  Gastrointestinal:  Negative for abdominal pain, constipation, diarrhea and vomiting.  Endocrine: Negative for cold intolerance and heat intolerance.  Genitourinary: Negative.  Negative for difficulty urinating.  Musculoskeletal:  Positive for arthralgias. Negative for myalgias.  Skin: Negative.  Negative for color change and pallor.  Neurological: Negative.  Negative for dizziness, weakness, light-headedness and numbness.  Hematological:  Negative for adenopathy. Does  not bruise/bleed easily.  Psychiatric/Behavioral: Negative.     Objective:  BP 136/84 (BP Location: Left Arm, Patient Position: Sitting, Cuff Size: Large)   Pulse 82   Temp 98.1 F (36.7 C) (Oral)   Ht 5' 5"  (1.651 m)   Wt 206 lb (93.4 kg)   SpO2 97%   BMI 34.28 kg/m   BP Readings from Last 3 Encounters:  07/14/21 136/84  06/12/21 130/86  05/15/21 118/82    Wt Readings from Last 3 Encounters:  07/14/21 206 lb (93.4 kg)  06/12/21 201 lb (91.2 kg)  05/15/21 205 lb 6.4 oz (93.2  kg)    Physical Exam Vitals reviewed.  Constitutional:      Appearance: She is not ill-appearing.  HENT:     Nose: Nose normal.     Mouth/Throat:     Mouth: Mucous membranes are moist.  Eyes:     General: No scleral icterus.    Conjunctiva/sclera: Conjunctivae normal.  Cardiovascular:     Rate and Rhythm: Normal rate and regular rhythm.     Heart sounds: No murmur heard. Pulmonary:     Effort: Pulmonary effort is normal.     Breath sounds: No stridor. No wheezing, rhonchi or rales.  Abdominal:     General: Abdomen is flat.     Palpations: There is no mass.     Tenderness: There is no abdominal tenderness. There is no guarding.     Hernia: No hernia is present.  Musculoskeletal:        General: Normal range of motion.     Cervical back: Neck supple.     Right lower leg: No edema.     Left lower leg: No edema.  Lymphadenopathy:     Cervical: No cervical adenopathy.  Skin:    General: Skin is warm and dry.     Findings: No rash.  Neurological:     General: No focal deficit present.     Mental Status: She is alert.  Psychiatric:        Mood and Affect: Mood normal.        Behavior: Behavior normal.    Lab Results  Component Value Date   WBC 9.9 07/14/2021   HGB 13.3 07/14/2021   HCT 39.5 07/14/2021   PLT 197.0 07/14/2021   GLUCOSE 95 07/14/2021   CHOL 214 (H) 07/14/2021   TRIG 152.0 (H) 07/14/2021   HDL 60.10 07/14/2021   LDLDIRECT 165.0 06/17/2020   LDLCALC 123 (H) 07/14/2021   ALT 44 02/06/2021   AST 32 02/06/2021   NA 141 07/14/2021   K 3.5 07/14/2021   CL 103 07/14/2021   CREATININE 1.88 (H) 07/14/2021   BUN 15 07/14/2021   CO2 29 07/14/2021   TSH 5.52 (H) 07/14/2021   INR 1.02 06/24/2017   HGBA1C 5.0 04/10/2021    NM PET Image Initial (PI) Skull Base To Thigh  Result Date: 06/24/2021 CLINICAL DATA:  Initial treatment strategy for retroperitoneal adenopathy. EXAM: NUCLEAR MEDICINE PET SKULL BASE TO THIGH TECHNIQUE: 9.95 mCi F-18 FDG was injected  intravenously. Full-ring PET imaging was performed from the skull base to thigh after the radiotracer. CT data was obtained and used for attenuation correction and anatomic localization. Fasting blood glucose: 112 mg/dl COMPARISON:  CT AP 02/06/2021 FINDINGS: Mediastinal blood pool activity: SUV max 3.61 Liver activity: SUV max 4.83 NECK: No hypermetabolic lymph nodes in the neck. Incidental CT findings: none CHEST: No hypermetabolic mediastinal or hilar nodes. No suspicious pulmonary nodules on the CT  scan. Incidental CT findings: Mild aortic atherosclerosis. ABDOMEN/PELVIS: No abnormal hypermetabolic activity within the liver, pancreas, adrenal glands, or spleen. No hypermetabolic lymph nodes in the abdomen or pelvis. Incidental CT findings: Aortic atherosclerosis. Hepatic steatosis. Status post cholecystectomy. SKELETON: No focal hypermetabolic activity to suggest skeletal metastasis. Incidental CT findings: none IMPRESSION: 1. No FDG avid mass or adenopathy identified to suggest metastatic disease or lymphoproliferative disorder. 2. Diffuse hepatic steatosis. 3.  Aortic Atherosclerosis (ICD10-I70.0). Electronically Signed   By: Kerby Moors M.D.   On: 06/24/2021 08:23    Assessment & Plan:   [name] was seen today for hypertension.  Diagnoses and all orders for this visit:  Diuretic-induced hypokalemia- Her potassium level is normal now. -     Basic metabolic panel; Future -     Basic metabolic panel  Flu vaccine need -     Flu Vaccine QUAD High Dose(Fluad)  Primary hypertension- Her blood pressure is adequately well controlled. -     CBC with Differential/Platelet; Future -     Basic metabolic panel; Future -     Basic metabolic panel -     CBC with Differential/Platelet  Acquired hypothyroidism- Her TSH is in the acceptable range.  She will stay on the current dose of T4. -     TSH; Future -     TSH  Hyperparathyroidism ([place])- Her calcium level is in the normal range. -     Basic  metabolic panel; Future -     Basic metabolic panel  CKD (chronic kidney disease) stage 2, GFR 60-89 ml/min-her renal function is stable. -     Basic metabolic panel; Future -     Basic metabolic panel  Hyperlipidemia with target LDL less than 160- Her ASCVD risk score is 19%.  She does not tolerate statins. -     Lipid panel; Future -     Lipid panel  Hyperglycemia- Her A1c is normal at 5.0%. -     Basic metabolic panel; Future -     Basic metabolic panel  Folate deficiency  I am having [place] maintain her cholecalciferol, rizatriptan, Calcium Carbonate-Vit D-Min (CALCIUM 1200 PO), acetaminophen, SYRINGE-NEEDLE (DISP) 3 ML, allopurinol, nebivolol, colestipol, folic acid, potassium chloride SA, cyanocobalamin, ALPRAZolam, HYDROcodone-acetaminophen, indapamide, levothyroxine, and colchicine.  No orders of the defined types were placed in this encounter.    Follow-up: Return in about 6 months (around 01/11/2022).  [name], MD

## 2021-07-14 NOTE — Patient Instructions (Signed)

## 2021-08-08 ENCOUNTER — Other Ambulatory Visit: Payer: Self-pay | Admitting: Internal Medicine

## 2021-08-12 DIAGNOSIS — N1832 Chronic kidney disease, stage 3b: Secondary | ICD-10-CM | POA: Diagnosis not present

## 2021-08-15 ENCOUNTER — Telehealth: Payer: Self-pay | Admitting: Internal Medicine

## 2021-08-15 NOTE — Telephone Encounter (Signed)
N/A unable to leave a message for patient to call me back at [phone] to schedule Medicare Annual Wellness Visit   Last AWV  07/05/20  Please schedule at anytime with [facility] if patient calls the office back.    40 Minutes appointment   Any questions, please call me at [phone]

## 2021-08-22 DIAGNOSIS — N2581 Secondary hyperparathyroidism of renal origin: Secondary | ICD-10-CM | POA: Diagnosis not present

## 2021-08-22 DIAGNOSIS — N1832 Chronic kidney disease, stage 3b: Secondary | ICD-10-CM | POA: Diagnosis not present

## 2021-08-22 DIAGNOSIS — I129 Hypertensive chronic kidney disease with stage 1 through stage 4 chronic kidney disease, or unspecified chronic kidney disease: Secondary | ICD-10-CM | POA: Diagnosis not present

## 2021-08-22 DIAGNOSIS — D631 Anemia in chronic kidney disease: Secondary | ICD-10-CM | POA: Diagnosis not present

## 2021-08-27 DIAGNOSIS — H43813 Vitreous degeneration, bilateral: Secondary | ICD-10-CM | POA: Diagnosis not present

## 2021-08-27 DIAGNOSIS — H524 Presbyopia: Secondary | ICD-10-CM | POA: Diagnosis not present

## 2021-08-27 DIAGNOSIS — H26493 Other secondary cataract, bilateral: Secondary | ICD-10-CM | POA: Diagnosis not present

## 2021-08-27 DIAGNOSIS — H35372 Puckering of macula, left eye: Secondary | ICD-10-CM | POA: Diagnosis not present

## 2021-09-09 ENCOUNTER — Telehealth: Payer: Self-pay

## 2021-09-09 ENCOUNTER — Other Ambulatory Visit: Payer: Self-pay | Admitting: Internal Medicine

## 2021-09-09 NOTE — Telephone Encounter (Signed)
Key: [id number]

## 2021-09-10 NOTE — Telephone Encounter (Signed)
Approved through 09/13/2022.

## 2021-09-13 ENCOUNTER — Other Ambulatory Visit: Payer: Self-pay | Admitting: Internal Medicine

## 2021-09-13 DIAGNOSIS — E538 Deficiency of other specified B group vitamins: Secondary | ICD-10-CM

## 2021-10-08 ENCOUNTER — Other Ambulatory Visit: Payer: Self-pay | Admitting: Internal Medicine

## 2021-11-10 ENCOUNTER — Other Ambulatory Visit: Payer: Self-pay | Admitting: Internal Medicine

## 2021-11-15 ENCOUNTER — Other Ambulatory Visit: Payer: Self-pay | Admitting: Internal Medicine

## 2021-11-15 DIAGNOSIS — E538 Deficiency of other specified B group vitamins: Secondary | ICD-10-CM

## 2021-11-20 ENCOUNTER — Other Ambulatory Visit: Payer: Self-pay

## 2021-11-20 ENCOUNTER — Ambulatory Visit (INDEPENDENT_AMBULATORY_CARE_PROVIDER_SITE_OTHER): Payer: Medicare Other | Admitting: Endocrinology

## 2021-11-20 VITALS — BP 110/70 | HR 78 | Ht 65.0 in | Wt 206.4 lb

## 2021-11-20 DIAGNOSIS — E559 Vitamin D deficiency, unspecified: Secondary | ICD-10-CM

## 2021-11-20 DIAGNOSIS — Z780 Asymptomatic menopausal state: Secondary | ICD-10-CM | POA: Diagnosis not present

## 2021-11-20 DIAGNOSIS — M8580 Other specified disorders of bone density and structure, unspecified site: Secondary | ICD-10-CM | POA: Diagnosis not present

## 2021-11-20 DIAGNOSIS — N1832 Chronic kidney disease, stage 3b: Secondary | ICD-10-CM | POA: Diagnosis not present

## 2021-11-20 NOTE — Patient Instructions (Addendum)
Please ask that the results of your blood tests from the kidney doctor be sent here.   ?Please call [phone] to schedule a Bone Density (DexaScan) a the [place] office at [place].  It should be after 12/07/21.   ?Please come back for a follow-up appointment in 1 year.   ? ?

## 2021-11-20 NOTE — Progress Notes (Signed)
? ?Subjective:  ? ? Patient ID: Valerie Jackson, female    DOB: 05/03/1946, 76 y.o.   MRN: [id number] ? ?HPI ?Pt returns for f/u of hypercalcemia (dx'ed early 2017 (it was normal in 2016); chlorthalidone was continued, as hypercalcemia was mild; no other cause was found; it normalized with vit-D supplementation).   ?She also has mild hyperparathyroidism (renal insufficiency could contribute; she did not meet criteria for surgery; she has never had urolithiasis; level returned to normal with vit-D supplementation).   ?Pt returns for f/u of osteoporosis:  ?Dx'ed: 2016 ?Secondary cause: mild elev PTH, but this is resolved.   ?Fractures: left 5th toe (1970), with an injury.  ?Past rx: none ?Current rx: none ?Last DEXA result (3/21) worst T-score was -1.3 ([facility]).   ?Other: pt states she feels well in general. ?Interval hx: she denies edema  ?For HTN: She takes aldactone now.   ?For Vit-D deficiency: she takes OTC supplement, uncertain dosage.   ?Past Medical History:  ?Diagnosis Date  ? Arthritis   ? fingers  ? Blood clot in vein 1968  ? Cataracts, bilateral   ? CHF (congestive heart failure) ([place])   ? Chronic kidney disease   ? CKD stage 2  ? COPD (chronic obstructive pulmonary disease) ([place])   ? Depression   ? Diverticulitis 09/2013  ? with perforation  ? Dizziness   ? with decreased BP readings or bending over  ? Floaters   ? GERD (gastroesophageal reflux disease)   ? H/O hiatal hernia   ? Headache(784.0)   ? migraine every 5- to 6 years  ? Heart murmur since age 64  ? mild  ? Hepatitis age 34 or 28  ? had heapatitis, not sure which kind  ? History of blood transfusion 1965  ? History of frequent urinary tract infections   ? Hypercholesteremia   ? Hypertension   ? Hypothyroid   ? Obesity   ? Pericolonic abscess 2015  ? Pneumonia   ? Sickle cell trait ([place])   ? ? ?Past Surgical History:  ?Procedure Laterality Date  ? CHOLECYSTECTOMY  2002  ? COLON RESECTION N/A 10/11/2013  ? Procedure: EXPLORATORY LAPAROTOMY WITH  RECTOSIGMOID COLECTOMY AND END COLOSTOMY;  Surgeon: Stark Klein, MD;  Location: [facility];  Service: General;  Laterality: N/A;  ? COLOSTOMY TAKEDOWN N/A 02/27/2014  ? Procedure: COLOSTOMY TAKEDOWN [name] HERNIA REPAIR;  Surgeon: Stark Klein, MD;  Location: [facility];  Service: General;  Laterality: N/A;  ? [place] N/A 03/04/2016  ? Procedure: LAPAROSCOPIC INCISIONAL HERNIA WITH MESH;  Surgeon: Ralene Ok, MD;  Location: [facility];  Service: General;  Laterality: N/A;  ? LAPAROSCOPIC LYSIS OF ADHESIONS N/A 03/04/2016  ? Procedure: LAPAROSCOPIC LYSIS OF ADHESIONS;  Surgeon: Ralene Ok, MD;  Location: [facility];  Service: General;  Laterality: N/A;  ? RIGHT HEART CATH N/A 06/24/2017  ? Procedure: RIGHT HEART CATH;  Surgeon: Jolaine Artist, MD;  Location: [facility];  Service: Cardiovascular;  Laterality: N/A;  ? TONSILLECTOMY  1952  ? ? ?Social History  ? ?Socioeconomic History  ? Marital status: Widowed  ?  Spouse name: Not on file  ? Number of children: 1  ? Years of education: 2  ? Highest education level: Not on file  ?Occupational History  ? Occupation: Retired  ?  Comment: RN  ?Tobacco Use  ? Smoking status: Former  ?  Packs/day: 0.50  ?  Years: 52.00  ?  Pack years: 26.00  ?  Types: Cigarettes  ?  Quit date: 09/06/2012  ?  Years since quitting: 9.2  ? Smokeless tobacco: Never  ? Tobacco comments:  ?  never smoked a full PPD.   ?Vaping Use  ? Vaping Use: Never used  ?Substance and Sexual Activity  ? Alcohol use: No  ?  Alcohol/week: 0.0 standard drinks  ?  Comment: very rarely  ? Drug use: No  ? Sexual activity: Not Currently  ?Other Topics Concern  ? Not on file  ?Social History Narrative  ? Regular exercise-no  ? Caffeine Use-yes  ? ?Social Determinants of Health  ? ?Financial Resource Strain: Not on file  ?Food Insecurity: Not on file  ?Transportation Needs: Not on file  ?Physical Activity: Not on file  ?Stress: Not on file  ?Social Connections: Not on file  ?Intimate Partner  Violence: Not on file  ? ? ?Current Outpatient Medications on File Prior to Visit  ?Medication Sig Dispense Refill  ? acetaminophen (TYLENOL) 500 MG tablet Take 1 tablet (500 mg total) by mouth every 6 (six) hours as needed. 30 tablet 0  ? allopurinol (ZYLOPRIM) 100 MG tablet Take 1 tablet (100 mg total) by mouth daily. 90 tablet 1  ? ALPRAZolam (XANAX) 0.5 MG tablet TAKE ONE TABLET BY MOUTH AT BEDTIME AS NEEDED FOR ANXIETY 30 tablet 2  ? Calcium Carbonate-Vit D-Min (CALCIUM 1200 PO) Take 1 tablet by mouth daily.    ? cholecalciferol (VITAMIN D) 1000 UNITS tablet Take 2,000 Units by mouth daily.     ? colchicine 0.6 MG tablet TAKE ONE TABLET BY MOUTH ONE TIME DAILY 30 tablet 6  ? colestipol (COLESTID) 1 g tablet Take 1 tablet (1 g total) by mouth 2 (two) times daily. 60 tablet 11  ? cyanocobalamin (,VITAMIN B-12,) 1000 MCG/ML injection Inject 1 mL (1,000 mcg total) into the muscle every 30 (thirty) days. 4 mL 3  ? folic acid (FOLVITE) 1 MG tablet TAKE ONE TABLET BY MOUTH ONE TIME DAILY 90 tablet 0  ? HYDROcodone-acetaminophen (NORCO/VICODIN) 5-325 MG tablet Take 1 tablet by mouth every 6 (six) hours as needed. 30 tablet 0  ? indapamide (LOZOL) 1.25 MG tablet Take 1 tablet (1.25 mg total) by mouth daily. 90 tablet 0  ? levothyroxine (SYNTHROID) 125 MCG tablet Take 1 tablet (125 mcg total) by mouth daily. 90 tablet 0  ? nebivolol (BYSTOLIC) 5 MG tablet Take 1 tablet (5 mg total) by mouth daily. 90 tablet 0  ? potassium chloride SA (KLOR-CON) 20 MEQ tablet Take 1 tablet (20 mEq total) by mouth 2 (two) times daily. 180 tablet 0  ? rizatriptan (MAXALT-MLT) 10 MG disintegrating tablet Take 1 tablet (10 mg total) by mouth as needed for migraine. May repeat in 2 hours if needed 30 tablet 1  ? SYRINGE-NEEDLE, DISP, 3 ML 25G X 5/8" 3 ML MISC 1 each by Does not apply route every 30 (thirty) days. 50 each 0  ? ?No current facility-administered medications on file prior to visit.  ? ? ?Allergies  ?Allergen Reactions  ? Crestor  [Rosuvastatin] Other (See Comments)  ?  Myalgias ?  ? Estrogens   ?  Blood clot in leg  ? ? ?Family History  ?Problem Relation Age of Onset  ? Hypertension Mother   ? Aneurysm Mother   ?     Cerebral (died from this)  ? Cancer Mother   ?     Uterine  ? Cancer Other   ? Arthritis Sister   ? Hyperlipidemia Sister   ?  Hypertension Sister   ? Stroke Sister   ? Kidney disease Sister   ?     Cancer  ? Hyperlipidemia Brother   ? Glaucoma Brother   ? Thyroid cancer Daughter   ? Breast cancer Maternal Grandmother 26  ? Hypertension Maternal Grandmother   ? Hypercalcemia Neg Hx   ? ? ?BP 110/70   Pulse 78   Ht 5' 5"  (1.651 m)   Wt 206 lb 6.4 oz (93.6 kg)   SpO2 99%   BMI 34.35 kg/m?  ? ? ?Review of Systems ? ?   ?Objective:  ? Physical Exam ? ? ? ?   ?Assessment & Plan:  ?Osteoporosis, due for recheck soon.  ?Hyperparathyroidism: pt says she will have recheck at nephrol  ? ?Patient Instructions  ?Please ask that the results of your blood tests from the kidney doctor be sent here.   ?Please call [phone] to schedule a Bone Density (DexaScan) a the [facility] office at [place].  It should be after 12/07/21.   ?Please come back for a follow-up appointment in 1 year.   ? ? ? ?

## 2021-11-21 DIAGNOSIS — D631 Anemia in chronic kidney disease: Secondary | ICD-10-CM | POA: Diagnosis not present

## 2021-11-21 DIAGNOSIS — N2581 Secondary hyperparathyroidism of renal origin: Secondary | ICD-10-CM | POA: Diagnosis not present

## 2021-11-21 DIAGNOSIS — N184 Chronic kidney disease, stage 4 (severe): Secondary | ICD-10-CM | POA: Diagnosis not present

## 2021-11-21 DIAGNOSIS — I129 Hypertensive chronic kidney disease with stage 1 through stage 4 chronic kidney disease, or unspecified chronic kidney disease: Secondary | ICD-10-CM | POA: Diagnosis not present

## 2021-12-07 ENCOUNTER — Other Ambulatory Visit: Payer: Self-pay | Admitting: Internal Medicine

## 2021-12-07 DIAGNOSIS — E039 Hypothyroidism, unspecified: Secondary | ICD-10-CM

## 2021-12-08 ENCOUNTER — Telehealth: Payer: Self-pay | Admitting: Internal Medicine

## 2021-12-08 NOTE — Telephone Encounter (Signed)
1.Medication Requested: HYDROcodone-acetaminophen (NORCO/VICODIN) 5-325 MG tablet ? ?2. Pharmacy (Name, [facility] [place], [facility]): [facility] [address], [place] ? ?3. On Med List: Y ? ?4. Last Visit with PCP: 07-14-2021 ? ?5. Next visit date with PCP: 01-12-2022 ? ? ?Agent: Please be advised that RX refills may take up to 3 business days. We ask that you follow-up with your pharmacy.  ?

## 2021-12-09 ENCOUNTER — Other Ambulatory Visit: Payer: Self-pay | Admitting: Internal Medicine

## 2021-12-09 DIAGNOSIS — M1A09X0 Idiopathic chronic gout, multiple sites, without tophus (tophi): Secondary | ICD-10-CM

## 2021-12-09 DIAGNOSIS — M159 Polyosteoarthritis, unspecified: Secondary | ICD-10-CM

## 2021-12-09 MED ORDER — HYDROCODONE-ACETAMINOPHEN 5-325 MG PO TABS: 1.0000 | ORAL_TABLET | Freq: Four times a day (QID) | ORAL | 0 refills | Status: DC | PRN

## 2022-01-12 ENCOUNTER — Other Ambulatory Visit: Payer: Self-pay | Admitting: Internal Medicine

## 2022-01-12 ENCOUNTER — Encounter: Payer: Self-pay | Admitting: Internal Medicine

## 2022-01-12 ENCOUNTER — Ambulatory Visit (INDEPENDENT_AMBULATORY_CARE_PROVIDER_SITE_OTHER): Payer: Medicare Other | Admitting: Internal Medicine

## 2022-01-12 VITALS — BP 132/82 | HR 80 | Temp 97.6°F | Resp 16 | Ht 65.0 in | Wt 196.0 lb

## 2022-01-12 DIAGNOSIS — H9222 Otorrhagia, left ear: Secondary | ICD-10-CM | POA: Diagnosis not present

## 2022-01-12 DIAGNOSIS — E039 Hypothyroidism, unspecified: Secondary | ICD-10-CM

## 2022-01-12 DIAGNOSIS — T502X5A Adverse effect of carbonic-anhydrase inhibitors, benzothiadiazides and other diuretics, initial encounter: Secondary | ICD-10-CM | POA: Diagnosis not present

## 2022-01-12 DIAGNOSIS — I700 Atherosclerosis of aorta: Secondary | ICD-10-CM | POA: Diagnosis not present

## 2022-01-12 DIAGNOSIS — E876 Hypokalemia: Secondary | ICD-10-CM | POA: Diagnosis not present

## 2022-01-12 DIAGNOSIS — N182 Chronic kidney disease, stage 2 (mild): Secondary | ICD-10-CM

## 2022-01-12 DIAGNOSIS — E538 Deficiency of other specified B group vitamins: Secondary | ICD-10-CM

## 2022-01-12 DIAGNOSIS — I10 Essential (primary) hypertension: Secondary | ICD-10-CM | POA: Diagnosis not present

## 2022-01-12 LAB — CBC WITH DIFFERENTIAL/PLATELET
Basophils Absolute: 0.1 10*3/uL (ref 0.0–0.1)
Basophils Relative: 1.4 % (ref 0.0–3.0)
Eosinophils Absolute: 0.3 10*3/uL (ref 0.0–0.7)
Eosinophils Relative: 4.7 % (ref 0.0–5.0)
HCT: 39.3 % (ref 36.0–46.0)
Hemoglobin: 13.5 g/dL (ref 12.0–15.0)
Lymphocytes Relative: 25.7 % (ref 12.0–46.0)
Lymphs Abs: 1.8 10*3/uL (ref 0.7–4.0)
MCHC: 34.3 g/dL (ref 30.0–36.0)
MCV: 90.5 fl (ref 78.0–100.0)
Monocytes Absolute: 0.4 10*3/uL (ref 0.1–1.0)
Monocytes Relative: 5.2 % (ref 3.0–12.0)
Neutro Abs: 4.5 10*3/uL (ref 1.4–7.7)
Neutrophils Relative %: 63 % (ref 43.0–77.0)
Platelets: 230 10*3/uL (ref 150.0–400.0)
RBC: 4.35 Mil/uL (ref 3.87–5.11)
RDW: 13.5 % (ref 11.5–15.5)
WBC: 7.1 10*3/uL (ref 4.0–10.5)

## 2022-01-12 LAB — BASIC METABOLIC PANEL
BUN: 8 mg/dL (ref 6–23)
CO2: 28 mEq/L (ref 19–32)
Calcium: 10.1 mg/dL (ref 8.4–10.5)
Chloride: 102 mEq/L (ref 96–112)
Creatinine, Ser: 1.66 mg/dL — ABNORMAL HIGH (ref 0.40–1.20)
GFR: 29.71 mL/min — ABNORMAL LOW (ref >60.00)
Glucose, Bld: 131 mg/dL — ABNORMAL HIGH (ref 70–99)
Potassium: 3 mEq/L — ABNORMAL LOW (ref 3.5–5.1)
Sodium: 140 mEq/L (ref 135–145)

## 2022-01-12 LAB — TSH: TSH: 5.74 u[IU]/mL — ABNORMAL HIGH (ref 0.35–5.50)

## 2022-01-12 LAB — VITAMIN B12: Vitamin B-12: 493 pg/mL (ref 211–911)

## 2022-01-12 MED ORDER — POTASSIUM CHLORIDE CRYS ER 15 MEQ PO TBCR: 15.0000 meq | EXTENDED_RELEASE_TABLET | Freq: Three times a day (TID) | ORAL | 0 refills | Status: DC

## 2022-01-12 NOTE — Progress Notes (Signed)
? ?Subjective:  ?Patient ID: Valerie Jackson, female    DOB: 09-27-1945  Age: 76 y.o. MRN: [id number] ? ?CC: Hypertension and Hypothyroidism ? ? ?HPI ?[name] presents for f/up -  ? ?She complains of a 90-monthhistory of painless, bleeding from her left ear.  She uses Q-tips.  She is active and denies chest pain, shortness of breath, diaphoresis, or edema. ? ?Outpatient Medications Prior to Visit  ?Medication Sig Dispense Refill  ? acetaminophen (TYLENOL) 500 MG tablet Take 1 tablet (500 mg total) by mouth every 6 (six) hours as needed. 30 tablet 0  ? allopurinol (ZYLOPRIM) 100 MG tablet Take 1 tablet (100 mg total) by mouth daily. 90 tablet 1  ? ALPRAZolam (XANAX) 0.5 MG tablet TAKE ONE TABLET BY MOUTH AT BEDTIME AS NEEDED FOR ANXIETY 30 tablet 2  ? cholecalciferol (VITAMIN D) 1000 UNITS tablet Take 2,000 Units by mouth daily.     ? colchicine 0.6 MG tablet TAKE ONE TABLET BY MOUTH ONE TIME DAILY 30 tablet 6  ? colestipol (COLESTID) 1 g tablet Take 1 tablet (1 g total) by mouth 2 (two) times daily. 60 tablet 11  ? cyanocobalamin (,VITAMIN B-12,) 1000 MCG/ML injection Inject 1 mL (1,000 mcg total) into the muscle every 30 (thirty) days. 4 mL 3  ? folic acid (FOLVITE) 1 MG tablet TAKE ONE TABLET BY MOUTH ONE TIME DAILY 90 tablet 0  ? HYDROcodone-acetaminophen (NORCO/VICODIN) 5-325 MG tablet Take 1 tablet by mouth every 6 (six) hours as needed. 30 tablet 0  ? levothyroxine (SYNTHROID) 125 MCG tablet TAKE ONE TABLET BY MOUTH ONE TIME DAILY 90 tablet 0  ? nebivolol (BYSTOLIC) 5 MG tablet Take 1 tablet (5 mg total) by mouth daily. 90 tablet 0  ? rizatriptan (MAXALT-MLT) 10 MG disintegrating tablet Take 1 tablet (10 mg total) by mouth as needed for migraine. May repeat in 2 hours if needed 30 tablet 1  ? indapamide (LOZOL) 1.25 MG tablet Take 1 tablet (1.25 mg total) by mouth daily. 90 tablet 0  ? SYRINGE-NEEDLE, DISP, 3 ML 25G X 5/8" 3 ML MISC 1 each by Does not apply route every 30 (thirty) days. 50 each 0  ?  Calcium Carbonate-Vit D-Min (CALCIUM 1200 PO) Take 1 tablet by mouth daily.    ? potassium chloride SA (KLOR-CON) 20 MEQ tablet Take 1 tablet (20 mEq total) by mouth 2 (two) times daily. 180 tablet 0  ? ?No facility-administered medications prior to visit.  ? ? ?ROS ?Review of Systems  ?Constitutional:  Positive for appetite change (decreased), fatigue and unexpected weight change (wt loss). Negative for chills, diaphoresis and fever.  ?HENT: Negative.  Negative for ear discharge, ear pain, facial swelling, hearing loss and nosebleeds.   ?     Left ear bleeding for 6 months ?She uses Q-tips  ?Eyes: Negative.   ?Respiratory: Negative.  Negative for cough, chest tightness, shortness of breath and wheezing.   ?Cardiovascular:  Negative for chest pain, palpitations and leg swelling.  ?Gastrointestinal:  Negative for abdominal pain, constipation, diarrhea and nausea.  ?Genitourinary: Negative.  Negative for difficulty urinating.  ?Musculoskeletal:  Negative for arthralgias.  ?Neurological: Negative.  Negative for dizziness, weakness and light-headedness.  ?Hematological:  Negative for adenopathy. Does not bruise/bleed easily.  ?Psychiatric/Behavioral: Negative.    ? ?Objective:  ?BP 132/82 (BP Location: Right Arm, Patient Position: Sitting, Cuff Size: Large)   Pulse 80   Temp 97.6 ?F (36.4 ?C) (Oral)   Resp 16   Ht 5'  5" (1.651 m)   Wt 196 lb (88.9 kg)   SpO2 96%   BMI 32.62 kg/m?  ? ?BP Readings from Last 3 Encounters:  ?01/12/22 132/82  ?11/20/21 110/70  ?07/14/21 136/84  ? ? ?Wt Readings from Last 3 Encounters:  ?01/12/22 196 lb (88.9 kg)  ?11/20/21 206 lb 6.4 oz (93.6 kg)  ?07/14/21 206 lb (93.4 kg)  ? ? ?Physical Exam ?Vitals reviewed.  ?HENT:  ?   Right Ear: Hearing, tympanic membrane, ear canal and external ear normal.  ?   Left Ear: Hearing, tympanic membrane and external ear normal.  ?   Ears:  ?   Comments: There is a small pool of dried blood over the distal floor of the left EAC but no swelling, ttp,  exudate, or FB. ?   Nose: Nose normal.  ?   Mouth/Throat:  ?   Mouth: Mucous membranes are moist.  ?Eyes:  ?   General: No scleral icterus. ?   Conjunctiva/sclera: Conjunctivae normal.  ?Cardiovascular:  ?   Rate and Rhythm: Normal rate and regular rhythm.  ?   Heart sounds: No murmur heard. ?Pulmonary:  ?   Effort: Pulmonary effort is normal.  ?   Breath sounds: No stridor. No wheezing, rhonchi or rales.  ?Abdominal:  ?   General: Abdomen is flat.  ?   Palpations: There is no mass.  ?   Tenderness: There is no abdominal tenderness. There is no guarding.  ?   Hernia: No hernia is present.  ?Musculoskeletal:  ?   Cervical back: Neck supple.  ?Lymphadenopathy:  ?   Cervical: No cervical adenopathy.  ?Skin: ?   General: Skin is warm and dry.  ?   Findings: No rash.  ?Neurological:  ?   General: No focal deficit present.  ?   Mental Status: She is alert. Mental status is at baseline.  ? ? ?Lab Results  ?Component Value Date  ? WBC 7.1 01/12/2022  ? HGB 13.5 01/12/2022  ? HCT 39.3 01/12/2022  ? PLT 230.0 01/12/2022  ? GLUCOSE 131 (H) 01/12/2022  ? CHOL 214 (H) 07/14/2021  ? TRIG 152.0 (H) 07/14/2021  ? HDL 60.10 07/14/2021  ? LDLDIRECT 165.0 06/17/2020  ? LDLCALC 123 (H) 07/14/2021  ? ALT 44 02/06/2021  ? AST 32 02/06/2021  ? NA 140 01/12/2022  ? K 3.0 (L) 01/12/2022  ? CL 102 01/12/2022  ? CREATININE 1.66 (H) 01/12/2022  ? BUN 8 01/12/2022  ? CO2 28 01/12/2022  ? TSH 5.74 (H) 01/12/2022  ? INR 1.02 06/24/2017  ? HGBA1C 5.0 04/10/2021  ? ? ?NM PET Image Initial (PI) Skull Base To Thigh ? ?Result Date: 06/24/2021 ?CLINICAL DATA:  Initial treatment strategy for retroperitoneal adenopathy. EXAM: NUCLEAR MEDICINE PET SKULL BASE TO THIGH TECHNIQUE: 9.95 mCi F-18 FDG was injected intravenously. Full-ring PET imaging was performed from the skull base to thigh after the radiotracer. CT data was obtained and used for attenuation correction and anatomic localization. Fasting blood glucose: 112 mg/dl COMPARISON:  CT AP 02/06/2021  FINDINGS: Mediastinal blood pool activity: SUV max 3.61 Liver activity: SUV max 4.83 NECK: No hypermetabolic lymph nodes in the neck. Incidental CT findings: none CHEST: No hypermetabolic mediastinal or hilar nodes. No suspicious pulmonary nodules on the CT scan. Incidental CT findings: Mild aortic atherosclerosis. ABDOMEN/PELVIS: No abnormal hypermetabolic activity within the liver, pancreas, adrenal glands, or spleen. No hypermetabolic lymph nodes in the abdomen or pelvis. Incidental CT findings: Aortic atherosclerosis. Hepatic steatosis. Status post cholecystectomy.  SKELETON: No focal hypermetabolic activity to suggest skeletal metastasis. Incidental CT findings: none IMPRESSION: 1. No FDG avid mass or adenopathy identified to suggest metastatic disease or lymphoproliferative disorder. 2. Diffuse hepatic steatosis. 3.  Aortic Atherosclerosis (ICD10-I70.0). Electronically Signed   By: Kerby Moors M.D.   On: 06/24/2021 08:23  ? ? ?Assessment & Plan:  ? ?[name] was seen today for hypertension and hypothyroidism. ? ?Diagnoses and all orders for this visit: ? ?Ear bleeding, left- I have asked her to stop using Q-tips. ?-     Ambulatory referral to ENT ? ?Primary hypertension- Her blood pressure is well controlled.  I will treat the hypokalemia. ?-     Basic metabolic panel; Future ?-     Basic metabolic panel ?-     potassium chloride SA (KLOR-CON M15) 15 MEQ tablet; Take 1 tablet (15 mEq total) by mouth 3 (three) times daily. ? ?Atherosclerosis of aorta ([place])- Risk factor modifications addressed. ? ?Acquired hypothyroidism- She is euthyroid. ?-     TSH; Future ?-     TSH ? ?CKD (chronic kidney disease) stage 2, GFR 60-89 ml/min- She is avoiding nephrotoxic agents. ?-     Basic metabolic panel; Future ?-     Basic metabolic panel ? ?B12 deficiency ?-     Vitamin B12; Future ?-     CBC with Differential/Platelet; Future ?-     Cancel: Folate; Future ?-     CBC with Differential/Platelet ?-     Vitamin  B12 ? ?Diuretic-induced hypokalemia ?-     Basic metabolic panel; Future ?-     Basic metabolic panel ?-     potassium chloride SA (KLOR-CON M15) 15 MEQ tablet; Take 1 tablet (15 mEq total) by mouth 3 (three) times daily. ? ?

## 2022-01-12 NOTE — Patient Instructions (Signed)

## 2022-01-13 MED ORDER — "SYRINGE/NEEDLE (DISP) 25G X 5/8"" 3 ML MISC": 1.0000 | 0 refills | Status: AC

## 2022-01-24 ENCOUNTER — Telehealth: Payer: Self-pay | Admitting: Physician Assistant

## 2022-01-24 NOTE — Telephone Encounter (Signed)
Patient called in today requesting refill for colestipol 1 g twice daily ? ?I called this into [id number] with 6 refills ? ?Patient of Dr. Henrene Pastor. ?

## 2022-02-13 ENCOUNTER — Other Ambulatory Visit: Payer: Self-pay | Admitting: Internal Medicine

## 2022-02-13 DIAGNOSIS — E538 Deficiency of other specified B group vitamins: Secondary | ICD-10-CM

## 2022-02-13 MED ORDER — FOLIC ACID 1 MG PO TABS: 1.0000 mg | ORAL_TABLET | Freq: Every day | ORAL | 0 refills | Status: DC

## 2022-03-07 ENCOUNTER — Other Ambulatory Visit: Payer: Self-pay | Admitting: Internal Medicine

## 2022-03-07 DIAGNOSIS — E039 Hypothyroidism, unspecified: Secondary | ICD-10-CM

## 2022-03-25 ENCOUNTER — Ambulatory Visit
Admission: RE | Admit: 2022-03-25 | Discharge: 2022-03-25 | Disposition: A | Discharge status: Home / Self Care | Payer: Medicare Other | Source: Ambulatory Visit | Attending: Internal Medicine | Admitting: Internal Medicine

## 2022-03-25 VITALS — BP 112/79 | HR 97 | Temp 97.9°F | Resp 16

## 2022-03-25 DIAGNOSIS — N950 Postmenopausal bleeding: Secondary | ICD-10-CM

## 2022-03-25 NOTE — ED Provider Notes (Signed)
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  Patient states she has not had pelvic exam in about 10 years.  Patient was advised to follow-up with gynecology to rule out endometrial hyperplasia, malignancy.   Lynden Oxford Scales, [place] 03/25/22 [phone]

## 2022-03-25 NOTE — ED Triage Notes (Addendum)
Reports moderate amount of bright red blood on toilet paper, enough to necessitate the use of a pad, starting Sunday and continuing into today. Denies any issues with urination or defecation, near syncope, abdominal pain, fever, no history of hysterectomy.

## 2022-04-06 ENCOUNTER — Ambulatory Visit (INDEPENDENT_AMBULATORY_CARE_PROVIDER_SITE_OTHER): Payer: Medicare Other

## 2022-04-06 ENCOUNTER — Telehealth: Payer: Self-pay

## 2022-04-06 ENCOUNTER — Other Ambulatory Visit: Payer: Self-pay | Admitting: Internal Medicine

## 2022-04-06 DIAGNOSIS — Z1231 Encounter for screening mammogram for malignant neoplasm of breast: Secondary | ICD-10-CM

## 2022-04-06 DIAGNOSIS — M8088XA Other osteoporosis with current pathological fracture, vertebra(e), initial encounter for fracture: Secondary | ICD-10-CM

## 2022-04-06 DIAGNOSIS — Z1239 Encounter for other screening for malignant neoplasm of breast: Secondary | ICD-10-CM

## 2022-04-06 DIAGNOSIS — G43009 Migraine without aura, not intractable, without status migrainosus: Secondary | ICD-10-CM

## 2022-04-06 DIAGNOSIS — Z0000 Encounter for general adult medical examination without abnormal findings: Secondary | ICD-10-CM | POA: Diagnosis not present

## 2022-04-06 DIAGNOSIS — Z13820 Encounter for screening for osteoporosis: Secondary | ICD-10-CM | POA: Diagnosis not present

## 2022-04-06 DIAGNOSIS — M1A09X0 Idiopathic chronic gout, multiple sites, without tophus (tophi): Secondary | ICD-10-CM

## 2022-04-06 DIAGNOSIS — F418 Other specified anxiety disorders: Secondary | ICD-10-CM

## 2022-04-06 DIAGNOSIS — M159 Polyosteoarthritis, unspecified: Secondary | ICD-10-CM

## 2022-04-06 DIAGNOSIS — R300 Dysuria: Secondary | ICD-10-CM | POA: Insufficient documentation

## 2022-04-06 MED ORDER — RIZATRIPTAN BENZOATE 10 MG PO TBDP: 10.0000 mg | ORAL_TABLET | ORAL | 1 refills | Status: DC | PRN

## 2022-04-06 MED ORDER — HYDROCODONE-ACETAMINOPHEN 5-325 MG PO TABS: 1.0000 | ORAL_TABLET | Freq: Four times a day (QID) | ORAL | 0 refills | Status: DC | PRN

## 2022-04-06 MED ORDER — ALPRAZOLAM 0.5 MG PO TABS: ORAL_TABLET | ORAL | 2 refills | Status: AC

## 2022-04-06 NOTE — Telephone Encounter (Signed)
Patient is requesting the following refills: Hydrocodone-apap, Rizatriptan, Xanax.  Also she is requesting to drop a urine samples by the lab due to cloudy urine and odor. Wants to r/o UTI. CB# [phone]

## 2022-04-06 NOTE — Progress Notes (Signed)
I connected with Oval Linsey today by telephone and verified that I am speaking with the correct person using two identifiers. Location patient: home Location provider: work Persons participating in the virtual visit: patient, provider.   I discussed the limitations, risks, security and privacy concerns of performing an evaluation and management service by telephone and the availability of in person appointments. I also discussed with the patient that there may be a patient responsible charge related to this service. The patient expressed understanding and verbally consented to this telephonic visit.    Interactive audio and video telecommunications were attempted between this provider and patient, however failed, due to patient having technical difficulties OR patient did not have access to video capability.  We continued and completed visit with audio only.  Some vital signs may be absent or patient reported.   Time Spent with patient on telephone encounter: 30 minutes  Subjective:   Valerie Jackson is a 76 y.o. female who presents for Medicare Annual (Subsequent) preventive examination.  Review of Systems     Cardiac Risk Factors include: advanced age (>37mn, >>64women);dyslipidemia;family history of premature cardiovascular disease;hypertension;obesity (BMI >30kg/m2)     Objective:    There were no vitals filed for this visit. There is no height or weight on file to calculate BMI.     04/06/2022   11:44 AM 07/05/2020    4:13 PM 05/27/2020   12:55 PM 09/24/2019    9:15 PM 11/03/2017   10:31 AM 06/21/2017    8:24 PM 10/15/2016   11:12 AM  Advanced Directives  Does Patient Have a Medical Advance Directive? No Yes No No No No No  Does patient want to make changes to medical advance directive?  No - Patient declined       Would patient like information on creating a medical advance directive? No - Patient declined  Yes (ED - Information included in AVS)  No - Patient declined No -  Patient declined No - Patient declined    Current Medications (verified) Outpatient Encounter Medications as of 04/06/2022  Medication Sig   acetaminophen (TYLENOL) 500 MG tablet Take 1 tablet (500 mg total) by mouth every 6 (six) hours as needed.   allopurinol (ZYLOPRIM) 100 MG tablet Take 1 tablet (100 mg total) by mouth daily.   ALPRAZolam (XANAX) 0.5 MG tablet TAKE ONE TABLET BY MOUTH AT BEDTIME AS NEEDED FOR ANXIETY   aspirin EC 81 MG tablet Take 81 mg by mouth daily. Swallow whole.   colchicine 0.6 MG tablet TAKE ONE TABLET BY MOUTH ONE TIME DAILY   colestipol (COLESTID) 1 g tablet Take 1 tablet (1 g total) by mouth 2 (two) times daily.   cyanocobalamin (,VITAMIN B-12,) 1000 MCG/ML injection Inject 1 mL (1,000 mcg total) into the muscle every 30 (thirty) days.   folic acid (FOLVITE) 1 MG tablet Take 1 tablet (1 mg total) by mouth daily.   HYDROcodone-acetaminophen (NORCO/VICODIN) 5-325 MG tablet Take 1 tablet by mouth every 6 (six) hours as needed.   indapamide (LOZOL) 1.25 MG tablet Take 1.25 mg by mouth daily.   levothyroxine (SYNTHROID) 125 MCG tablet TAKE ONE TABLET BY MOUTH ONE TIME DAILY   potassium chloride SA (KLOR-CON M15) 15 MEQ tablet Take 1 tablet (15 mEq total) by mouth 3 (three) times daily.   rizatriptan (MAXALT-MLT) 10 MG disintegrating tablet Take 1 tablet (10 mg total) by mouth as needed for migraine. May repeat in 2 hours if needed   SYRINGE-NEEDLE, DISP, 3 ML 25G  X 5/8" 3 ML MISC 1 each by Does not apply route every 30 (thirty) days.   cholecalciferol (VITAMIN D) 1000 UNITS tablet Take 2,000 Units by mouth daily.  (Patient not taking: Reported on 04/06/2022)   nebivolol (BYSTOLIC) 5 MG tablet Take 1 tablet (5 mg total) by mouth daily. (Patient not taking: Reported on 04/06/2022)   No facility-administered encounter medications on file as of 04/06/2022.    Allergies (verified) Crestor [rosuvastatin] and Estrogens   History: Past Medical History:  Diagnosis Date    Arthritis    fingers   Blood clot in vein 1968   Cataracts, bilateral    CHF (congestive heart failure) (HCC)    Chronic kidney disease    CKD stage 2   COPD (chronic obstructive pulmonary disease) (HCC)    Depression    Diverticulitis 09/2013   with perforation   Dizziness    with decreased BP readings or bending over   Floaters    GERD (gastroesophageal reflux disease)    H/O hiatal hernia    Headache(784.0)    migraine every 5- to 6 years   Heart murmur since age 4   mild   Hepatitis age 54 or 62   had heapatitis, not sure which kind   History of blood transfusion 1965   History of frequent urinary tract infections    Hypercholesteremia    Hypertension    Hypothyroid    Obesity    Pericolonic abscess 2015   Pneumonia    Sickle cell trait ([place])    Past Surgical History:  Procedure Laterality Date   CHOLECYSTECTOMY  2002   COLON RESECTION N/A 10/11/2013   Procedure: EXPLORATORY LAPAROTOMY WITH RECTOSIGMOID COLECTOMY AND END COLOSTOMY;  Surgeon: Stark Klein, MD;  Location: [facility];  Service: General;  Laterality: N/A;   COLOSTOMY TAKEDOWN N/A 02/27/2014   Procedure: COLOSTOMY TAKEDOWN [name] HERNIA REPAIR;  Surgeon: Stark Klein, MD;  Location: [facility];  Service: General;  Laterality: N/A;   INCISIONAL HERNIA REPAIR N/A 03/04/2016   Procedure: LAPAROSCOPIC INCISIONAL HERNIA WITH MESH;  Surgeon: Ralene Ok, MD;  Location: [facility];  Service: General;  Laterality: N/A;   LAPAROSCOPIC LYSIS OF ADHESIONS N/A 03/04/2016   Procedure: LAPAROSCOPIC LYSIS OF ADHESIONS;  Surgeon: Ralene Ok, MD;  Location: [facility];  Service: General;  Laterality: N/A;   RIGHT HEART CATH N/A 06/24/2017   Procedure: RIGHT HEART CATH;  Surgeon: Jolaine Artist, MD;  Location: [facility];  Service: Cardiovascular;  Laterality: N/A;   TONSILLECTOMY  1952   Family History  Problem Relation Age of Onset   Hypertension Mother    Aneurysm Mother        Cerebral (died from this)    Cancer Mother        Uterine   Cancer Other    Arthritis Sister    Hyperlipidemia Sister    Hypertension Sister    Stroke Sister    Kidney disease Sister        Cancer   Hyperlipidemia Brother    Glaucoma Brother    Thyroid cancer Daughter    Breast cancer Maternal Grandmother 78   Hypertension Maternal Grandmother    Hypercalcemia Neg Hx    Social History   Socioeconomic History   Marital status: Widowed    Spouse name: Not on file   Number of children: 1   Years of education: 14   Highest education level: Not on file  Occupational History   Occupation: Retired  Comment: RN  Tobacco Use   Smoking status: Former    Packs/day: 0.50    Years: 52.00    Total pack years: 26.00    Types: Cigarettes    Quit date: 09/06/2012    Years since quitting: 9.5   Smokeless tobacco: Never   Tobacco comments:    never smoked a full PPD.   Vaping Use   Vaping Use: Never used  Substance and Sexual Activity   Alcohol use: No    Alcohol/week: 0.0 standard drinks of alcohol    Comment: very rarely   Drug use: No   Sexual activity: Not Currently  Other Topics Concern   Not on file  Social History Narrative   Regular exercise-no   Caffeine Use-yes   Social Determinants of Health   Financial Resource Strain: Low Risk  (04/06/2022)   Overall Financial Resource Strain (CARDIA)    Difficulty of Paying Living Expenses: Not hard at all  Food Insecurity: No Food Insecurity (04/06/2022)   Hunger Vital Sign    Worried About Running Out of Food in the Last Year: Never true    Ran Out of Food in the Last Year: Never true  Transportation Needs: No Transportation Needs (04/06/2022)   PRAPARE - Hydrologist (Medical): No    Lack of Transportation (Non-Medical): No  Physical Activity: Inactive (07/05/2020)   Exercise Vital Sign    Days of Exercise per Week: 0 days    Minutes of Exercise per Session: 0 min  Stress: No Stress Concern Present (04/06/2022)    [place]    Feeling of Stress : Not at all  Social Connections: Socially Isolated (07/05/2020)   Social Connection and Isolation Panel [NHANES]    Frequency of Communication with Friends and Family: More than three times a week    Frequency of Social Gatherings with Friends and Family: More than three times a week    Attends Religious Services: Never    Marine scientist or Organizations: No    Attends Archivist Meetings: Never    Marital Status: Widowed    Tobacco Counseling Counseling given: Not Answered Tobacco comments: never smoked a full PPD.    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 no  Interpreter Needed?: No  Information entered by :: [employer], LPN.   Activities of Daily Living    04/06/2022   11:50 AM 07/14/2021   10:55 AM  In your present state of health, do you have any difficulty performing the following activities:  Hearing? 0 0  Vision? 0 0  Difficulty concentrating or making decisions? 1 0  Walking or climbing stairs? 0 0  Dressing or bathing? 0 0  Doing errands, shopping? 0 0  Preparing Food and eating ? N   Using the Toilet? N   In the past six months, have you accidently leaked urine? Y   Do you have problems with loss of bowel control? N   Managing your Medications? N   Managing your Finances? N  Housekeeping or managing your Housekeeping? N     Patient Care Team: Janith Lima, MD as PCP - General (Internal Medicine) Renato Shin, MD (Inactive) as Consulting  Physician (Endocrinology) Terrance Mass, MD (Inactive) as Consulting Physician (Gynecology) Irene Shipper, MD as Consulting Physician (Gastroenterology) Princess Bruins, MD as Consulting Physician (Obstetrics and Gynecology) Marygrace Drought, MD as Consulting Physician (Ophthalmology)  Indicate any recent Medical Services you may have received from other than [facility] providers in the past year (date may be approximate).     Assessment:   This is a routine wellness examination for Valerie Jackson.  Hearing/Vision screen Hearing Screening - Comments:: Patient denied any hearing difficulty.   No hearing aids.   Vision Screening - Comments:: Patient does wear readers.  Eye exam done by: Marygrace Drought, MD.   Dietary issues and exercise activities discussed: Current Exercise Habits: The patient does not participate in regular exercise at present, Exercise limited by: orthopedic condition(s);respiratory conditions(s)   Goals Addressed             This Visit's Progress    Patient declined health goal at this time.        Depression Screen    04/06/2022   11:49 AM 01/12/2022   10:50 AM 07/05/2020    4:11 PM 01/17/2020    1:15 PM 08/02/2019   11:55 AM 01/16/2019    2:40 PM 09/28/2018    3:06 PM  PHQ 2/9 Scores  PHQ - 2 Score 0 2 0 0 2 2 2   PHQ- 9 Score  11  3 3 6 7     Fall Risk    04/06/2022   11:45 AM 01/12/2022   10:51 AM 07/05/2020    4:13 PM 01/17/2020    1:15 PM 01/16/2019    2:39 PM  Fall Risk   Falls in the past year? 0 0 0 0 0  Number falls in past yr: 0  0 0 0  Injury with Fall? 0  0 0 0  Risk for fall due to : No Fall Risks  No Fall Risks No Fall Risks   Follow up Falls evaluation completed  Falls evaluation completed Falls evaluation completed Falls evaluation completed    [place]:  Any stairs in or around the home? Yes  If so, are there any without handrails? No  Home free of loose throw rugs in walkways, pet beds, electrical  cords, etc? Yes  Adequate lighting in your home to reduce risk of falls? Yes   ASSISTIVE DEVICES UTILIZED TO PREVENT FALLS:  Life alert? No  Use of a cane, walker or w/c? No  Grab bars in the bathroom? No  Shower chair or bench in shower? No  Elevated toilet seat or a handicapped toilet? No   TIMED UP AND GO:  Was the test performed? No .  Length of time to ambulate 10 feet: n/a sec.   Appearance of gait: Gait not evaluated during this visit.  Cognitive Function:        04/06/2022   11:57 AM 07/05/2020    4:15 PM  6CIT Screen  What Year? 0 points 0 points  What month? 0 points 0 points  What time? 0 points 0 points  Count back from 20 0 points 0 points  Months in reverse 0 points 0 points  Repeat phrase 0 points 0 points  Total Score 0 points 0 points    Immunizations Immunization History  Administered Date(s) Administered  Fluad Quad(high Dose 65+) 05/26/2019, 06/17/2020, 07/14/2021   Influenza, High Dose Seasonal PF 07/19/2013, 09/28/2018   PFIZER(Purple Top)SARS-COV-2 Vaccination 12/09/2019, 12/30/2019, 08/15/2020   Pneumococcal Conjugate-13 07/19/2013   Pneumococcal Polysaccharide-23 09/25/2015   Td 09/15/2011    TDAP status: Due, Education has been provided regarding the importance of this vaccine. Advised may receive this vaccine at local pharmacy or Health Dept. Aware to provide a copy of the vaccination record if obtained from local pharmacy or Health Dept. Verbalized acceptance and understanding.  Flu Vaccine status: Up to date  Pneumococcal vaccine status: Up to date  Covid-19 vaccine status: Completed vaccines  Qualifies for Shingles Vaccine? Yes   Zostavax completed No   Shingrix Completed?: No.    Education has been provided regarding the importance of this vaccine. Patient has been advised to call insurance company to determine out of pocket expense if they have not yet received this vaccine. Advised may also receive vaccine at local pharmacy or  Health Dept. Verbalized acceptance and understanding.  Screening Tests Health Maintenance  Topic Date Due   Zoster Vaccines- Shingrix (1 of 2) Never done   COVID-19 Vaccine (4 - Pfizer series) 10/10/2020   TETANUS/TDAP  09/14/2021   INFLUENZA VACCINE  04/14/2022   COLONOSCOPY (Pts 45-58yr Insurance coverage will need to be confirmed)  03/14/2024   Pneumonia Vaccine 76 Years old  Completed   DEXA SCAN  Completed   Hepatitis C Screening  Completed   HPV VACCINES  Aged Out    Health Maintenance  Health Maintenance Due  Topic Date Due   Zoster Vaccines- Shingrix (1 of 2) Never done   COVID-19 Vaccine (4 - Pfizer series) 10/10/2020   TETANUS/TDAP  09/14/2021    Colorectal cancer screening: Type of screening: Colonoscopy. Completed 03/15/2019. Repeat every 5 years  Mammogram status: Ordered 04/06/2022. Pt provided with contact info and advised to call to schedule appt.   Bone Density status: Ordered 04/06/2022. Pt provided with contact info and advised to call to schedule appt.  Lung Cancer Screening: (Low Dose CT Chest recommended if Age 77-80years, 30 pack-year currently smoking OR have quit w/in 15years.) does not qualify.   Lung Cancer Screening Referral: no  Additional Screening:  Hepatitis C Screening: does qualify; Completed 07/19/2013  Vision Screening: Recommended annual ophthalmology exams for early detection of glaucoma and other disorders of the eye. Is the patient up to date with their annual eye exam?  Yes  Who is the provider or what is the name of the office in which the patient attends annual eye exams? MMarygrace Drought MD. If pt is not established with a provider, would they like to be referred to a provider to establish care? No .   Dental Screening: Recommended annual dental exams for proper oral hygiene  Community Resource Referral / Chronic Care Management: CRR required this visit?  No   CCM required this visit?  No      Plan:     I have personally  reviewed and noted the following in the patient's chart:   Medical and social history Use of alcohol, tobacco or illicit drugs  Current medications and supplements including opioid prescriptions.  Functional ability and status Nutritional status Physical activity Advanced directives List of other physicians Hospitalizations, surgeries, and ER visits in previous 12 months Vitals Screenings to include cognitive, depression, and falls Referrals and appointments  In addition, I have reviewed and discussed with patient certain preventive protocols, quality metrics, and best practice recommendations. A written personalized care  plan for preventive services as well as general preventive health recommendations were provided to patient.     Sheral Flow, LPN   12/11/5186   Nurse Notes:  Patient is cogitatively intact. There were no vitals filed for this visit. There is no height or weight on file to calculate BMI.

## 2022-04-06 NOTE — Telephone Encounter (Signed)
Pt has been informed Rx's sent and UA ordered.

## 2022-04-06 NOTE — Patient Instructions (Signed)
Valerie Jackson , Thank you for taking time to come for your Medicare Wellness Visit. I appreciate your ongoing commitment to your health goals. Please review the following plan we discussed and let me know if I can assist you in the future.   Screening recommendations/referrals: Colonoscopy: 03/15/2019; due every 5 years Mammogram: order placed 04/06/2022  Bone Density: order placed 04/06/2022 Recommended yearly ophthalmology/optometry visit for glaucoma screening and checkup Recommended yearly dental visit for hygiene and checkup  Vaccinations: Influenza vaccine: 07/14/2021 Pneumococcal vaccine: 07/19/2013, 09/25/2015 Tdap vaccine: 09/15/2011; due every 10 years (overdue) Shingles vaccine: never done   Covid-19: 12/09/2019, 12/30/2019, 08/15/2020  Advanced directives: No  Conditions/risks identified: Yes  Next appointment: Please schedule your next Medicare Wellness Visit with your Nurse Health Advisor in 1 year by calling [phone].   Preventive Care 77 Years and Older, Female Preventive care refers to lifestyle choices and visits with your health care provider that can promote health and wellness. What does preventive care include? A yearly physical exam. This is also called an annual well check. Dental exams once or twice a year. Routine eye exams. Ask your health care provider how often you should have your eyes checked. Personal lifestyle choices, including: Daily care of your teeth and gums. Regular physical activity. Eating a healthy diet. Avoiding tobacco and drug use. Limiting alcohol use. Practicing safe sex. Taking low-dose aspirin every day. Taking vitamin and mineral supplements as recommended by your health care provider. What happens during an annual well check? The services and screenings done by your health care provider during your annual well check will depend on your age, overall health, lifestyle risk factors, and family history of disease. Counseling  Your health  care provider may ask you questions about your: Alcohol use. Tobacco use. Drug use. Emotional well-being. Home and relationship well-being. Sexual activity. Eating habits. History of falls. Memory and ability to understand (cognition). Work and work Statistician. Reproductive health. Screening  You may have the following tests or measurements: Height, weight, and BMI. Blood pressure. Lipid and cholesterol levels. These may be checked every 5 years, or more frequently if you are over 77 years old. Skin check. Lung cancer screening. You may have this screening every year starting at age 77 if you have a 30-pack-year history of smoking and currently smoke or have quit within the past 15 years. Fecal occult blood test (FOBT) of the stool. You may have this test every year starting at age 77. Flexible sigmoidoscopy or colonoscopy. You may have a sigmoidoscopy every 5 years or a colonoscopy every 10 years starting at age 77. Hepatitis C blood test. Hepatitis B blood test. Sexually transmitted disease (STD) testing. Diabetes screening. This is done by checking your blood sugar (glucose) after you have not eaten for a while (fasting). You may have this done every 1-3 years. Bone density scan. This is done to screen for osteoporosis. You may have this done starting at age 77. Mammogram. This may be done every 1-2 years. Talk to your health care provider about how often you should have regular mammograms. Talk with your health care provider about your test results, treatment options, and if necessary, the need for more tests. Vaccines  Your health care provider may recommend certain vaccines, such as: Influenza vaccine. This is recommended every year. Tetanus, diphtheria, and acellular pertussis (Tdap, Td) vaccine. You may need a Td booster every 10 years. Zoster vaccine. You may need this after age 77. Pneumococcal 13-valent conjugate (PCV13) vaccine. One dose is recommended  after age  18. Pneumococcal polysaccharide (PPSV23) vaccine. One dose is recommended after age 45. Talk to your health care provider about which screenings and vaccines you need and how often you need them. This information is not intended to replace advice given to you by your health care provider. Make sure you discuss any questions you have with your health care provider. Document Released: 09/27/2015 Document Revised: 05/20/2016 Document Reviewed: 07/02/2015 Elsevier Interactive Patient Education  2017 [place] Prevention in the Home Falls can cause injuries. They can happen to people of all ages. There are many things you can do to make your home safe and to help prevent falls. What can I do on the outside of my home? Regularly fix the edges of walkways and driveways and fix any cracks. Remove anything that might make you trip as you walk through a door, such as a raised step or threshold. Trim any bushes or trees on the path to your home. Use bright outdoor lighting. Clear any walking paths of anything that might make someone trip, such as rocks or tools. Regularly check to see if handrails are loose or broken. Make sure that both sides of any steps have handrails. Any raised decks and porches should have guardrails on the edges. Have any leaves, snow, or ice cleared regularly. Use sand or salt on walking paths during winter. Clean up any spills in your garage right away. This includes oil or grease spills. What can I do in the bathroom? Use night lights. Install grab bars by the toilet and in the tub and shower. Do not use towel bars as grab bars. Use non-skid mats or decals in the tub or shower. If you need to sit down in the shower, use a plastic, non-slip stool. Keep the floor dry. Clean up any water that spills on the floor as soon as it happens. Remove soap buildup in the tub or shower regularly. Attach bath mats securely with double-sided non-slip rug tape. Do not have throw  rugs and other things on the floor that can make you trip. What can I do in the bedroom? Use night lights. Make sure that you have a light by your bed that is easy to reach. Do not use any sheets or blankets that are too big for your bed. They should not hang down onto the floor. Have a firm chair that has side arms. You can use this for support while you get dressed. Do not have throw rugs and other things on the floor that can make you trip. What can I do in the kitchen? Clean up any spills right away. Avoid walking on wet floors. Keep items that you use a lot in easy-to-reach places. If you need to reach something above you, use a strong step stool that has a grab bar. Keep electrical cords out of the way. Do not use floor polish or wax that makes floors slippery. If you must use wax, use non-skid floor wax. Do not have throw rugs and other things on the floor that can make you trip. What can I do with my stairs? Do not leave any items on the stairs. Make sure that there are handrails on both sides of the stairs and use them. Fix handrails that are broken or loose. Make sure that handrails are as long as the stairways. Check any carpeting to make sure that it is firmly attached to the stairs. Fix any carpet that is loose or worn. Avoid having throw  rugs at the top or bottom of the stairs. If you do have throw rugs, attach them to the floor with carpet tape. Make sure that you have a light switch at the top of the stairs and the bottom of the stairs. If you do not have them, ask someone to add them for you. What else can I do to help prevent falls? Wear shoes that: Do not have high heels. Have rubber bottoms. Are comfortable and fit you well. Are closed at the toe. Do not wear sandals. If you use a stepladder: Make sure that it is fully opened. Do not climb a closed stepladder. Make sure that both sides of the stepladder are locked into place. Ask someone to hold it for you, if  possible. Clearly mark and make sure that you can see: Any grab bars or handrails. First and last steps. Where the edge of each step is. Use tools that help you move around (mobility aids) if they are needed. These include: Canes. Walkers. Scooters. Crutches. Turn on the lights when you go into a dark area. Replace any light bulbs as soon as they burn out. Set up your furniture so you have a clear path. Avoid moving your furniture around. If any of your floors are uneven, fix them. If there are any pets around you, be aware of where they are. Review your medicines with your doctor. Some medicines can make you feel dizzy. This can increase your chance of falling. Ask your doctor what other things that you can do to help prevent falls. This information is not intended to replace advice given to you by your health care provider. Make sure you discuss any questions you have with your health care provider. Document Released: 06/27/2009 Document Revised: 02/06/2016 Document Reviewed: 10/05/2014 Elsevier Interactive Patient Education  2017 [employer].

## 2022-04-08 ENCOUNTER — Other Ambulatory Visit (INDEPENDENT_AMBULATORY_CARE_PROVIDER_SITE_OTHER): Payer: Medicare Other

## 2022-04-08 DIAGNOSIS — R300 Dysuria: Secondary | ICD-10-CM | POA: Diagnosis not present

## 2022-04-08 LAB — URINALYSIS, ROUTINE W REFLEX MICROSCOPIC
Bilirubin Urine: NEGATIVE
Hgb urine dipstick: NEGATIVE
Ketones, ur: NEGATIVE
Nitrite: POSITIVE — AB
Specific Gravity, Urine: 1.02 (ref 1.000–1.030)
Total Protein, Urine: 30 — AB
Urine Glucose: NEGATIVE
Urobilinogen, UA: 0.2 (ref 0.0–1.0)
pH: 5.5 (ref 5.0–8.0)

## 2022-04-09 ENCOUNTER — Other Ambulatory Visit: Payer: Self-pay | Admitting: Internal Medicine

## 2022-04-09 DIAGNOSIS — N3000 Acute cystitis without hematuria: Secondary | ICD-10-CM | POA: Insufficient documentation

## 2022-04-09 MED ORDER — SULFAMETHOXAZOLE-TRIMETHOPRIM 400-80 MG PO TABS: 1.0000 | ORAL_TABLET | Freq: Two times a day (BID) | ORAL | 0 refills | Status: AC

## 2022-04-11 LAB — CULTURE, URINE COMPREHENSIVE

## 2022-04-14 ENCOUNTER — Encounter: Payer: Self-pay | Admitting: Internal Medicine

## 2022-04-20 ENCOUNTER — Ambulatory Visit
Admission: RE | Admit: 2022-04-20 | Discharge: 2022-04-20 | Disposition: A | Discharge status: Home / Self Care | Payer: Medicare Other | Source: Ambulatory Visit | Attending: Internal Medicine | Admitting: Internal Medicine

## 2022-04-20 DIAGNOSIS — Z1231 Encounter for screening mammogram for malignant neoplasm of breast: Secondary | ICD-10-CM

## 2022-04-20 DIAGNOSIS — Z1239 Encounter for other screening for malignant neoplasm of breast: Secondary | ICD-10-CM

## 2022-04-29 DIAGNOSIS — N184 Chronic kidney disease, stage 4 (severe): Secondary | ICD-10-CM | POA: Diagnosis not present

## 2022-05-04 DIAGNOSIS — N2581 Secondary hyperparathyroidism of renal origin: Secondary | ICD-10-CM | POA: Diagnosis not present

## 2022-05-04 DIAGNOSIS — I129 Hypertensive chronic kidney disease with stage 1 through stage 4 chronic kidney disease, or unspecified chronic kidney disease: Secondary | ICD-10-CM | POA: Diagnosis not present

## 2022-05-04 DIAGNOSIS — D631 Anemia in chronic kidney disease: Secondary | ICD-10-CM | POA: Diagnosis not present

## 2022-05-04 DIAGNOSIS — N184 Chronic kidney disease, stage 4 (severe): Secondary | ICD-10-CM | POA: Diagnosis not present

## 2022-05-04 DIAGNOSIS — N390 Urinary tract infection, site not specified: Secondary | ICD-10-CM | POA: Diagnosis not present

## 2022-05-24 ENCOUNTER — Other Ambulatory Visit: Payer: Self-pay | Admitting: Internal Medicine

## 2022-05-24 DIAGNOSIS — E538 Deficiency of other specified B group vitamins: Secondary | ICD-10-CM

## 2022-05-26 ENCOUNTER — Other Ambulatory Visit: Payer: Self-pay | Admitting: Internal Medicine

## 2022-06-03 ENCOUNTER — Other Ambulatory Visit: Payer: Self-pay | Admitting: Internal Medicine

## 2022-06-03 DIAGNOSIS — E039 Hypothyroidism, unspecified: Secondary | ICD-10-CM

## 2022-07-01 ENCOUNTER — Telehealth: Payer: Self-pay

## 2022-07-01 ENCOUNTER — Other Ambulatory Visit: Payer: Self-pay | Admitting: Internal Medicine

## 2022-07-01 DIAGNOSIS — E538 Deficiency of other specified B group vitamins: Secondary | ICD-10-CM

## 2022-07-01 NOTE — Telephone Encounter (Signed)
Verbal ok given to [employer], [employer], Pharmacy, to proceed with alternative levothyroxine manufacturer.

## 2022-07-01 NOTE — Telephone Encounter (Signed)
[facility] called and they need a verbal okay from [place] that it is okay for them to dispense levothyroxine (SYNTHROID) 125 MCG tablet from another manufacture.

## 2022-08-10 ENCOUNTER — Telehealth: Payer: Self-pay | Admitting: Internal Medicine

## 2022-08-10 NOTE — Telephone Encounter (Signed)
Patient is calling stating her gout has spread to other foot, middle toe to third toe below toe area. Her foot is super sensitive to touch and slightly warm to touch as well. Patient would like a call back on next steps. Call back number [phone]. Out of blood pressure medicine as well.

## 2022-08-10 NOTE — Telephone Encounter (Signed)
Pt has been scheduled for 11/30 @ 11.20 with PCP. CM attempted to book with another provider. However, those providers had no openings until Wednesday. Pt stated that she would be seen at [facility] if the pain becomes unbearable.

## 2022-08-13 ENCOUNTER — Ambulatory Visit (INDEPENDENT_AMBULATORY_CARE_PROVIDER_SITE_OTHER): Payer: Medicare Other | Admitting: Internal Medicine

## 2022-08-13 ENCOUNTER — Encounter: Payer: Self-pay | Admitting: Internal Medicine

## 2022-08-13 VITALS — BP 138/88 | HR 78 | Temp 97.9°F | Ht 65.0 in | Wt 195.0 lb

## 2022-08-13 DIAGNOSIS — E538 Deficiency of other specified B group vitamins: Secondary | ICD-10-CM

## 2022-08-13 DIAGNOSIS — Z23 Encounter for immunization: Secondary | ICD-10-CM | POA: Diagnosis not present

## 2022-08-13 DIAGNOSIS — M109 Gout, unspecified: Secondary | ICD-10-CM | POA: Insufficient documentation

## 2022-08-13 MED ORDER — CYANOCOBALAMIN 1000 MCG/ML IJ SOLN: 1000.0000 ug | INTRAMUSCULAR | 3 refills | Status: DC

## 2022-08-13 MED ORDER — METHYLPREDNISOLONE ACETATE 80 MG/ML IJ SUSP
120.0000 mg | Freq: Once | INTRAMUSCULAR | Status: AC
  Administered 2022-08-13: 120 mg via INTRAMUSCULAR

## 2022-08-13 MED ORDER — OXYCODONE HCL 5 MG PO TABS: 5.0000 mg | ORAL_TABLET | ORAL | 0 refills | Status: AC | PRN

## 2022-08-13 MED ORDER — COLCHICINE 0.6 MG PO TABS: 0.6000 mg | ORAL_TABLET | Freq: Every day | ORAL | 0 refills | Status: DC

## 2022-08-13 MED ORDER — OXYCODONE HCL 5 MG PO TABS: 5.0000 mg | ORAL_TABLET | ORAL | 0 refills | Status: DC | PRN

## 2022-08-13 NOTE — Patient Instructions (Signed)
Gout  Gout is a condition that causes painful swelling of the joints. Gout is a type of inflammation of the joints (arthritis). This condition is caused by having too much uric acid in the body. Uric acid is a chemical that forms when the body breaks down substances called purines. Purines are important for building body proteins. When the body has too much uric acid, sharp crystals can form and build up inside the joints. This causes pain and swelling. Gout attacks can happen quickly and may be very painful (acute gout). Over time, the attacks can affect more joints and become more frequent (chronic gout). Gout can also cause uric acid to build up under the skin and inside the kidneys. What are the causes? This condition is caused by too much uric acid in your blood. This can happen because: Your kidneys do not remove enough uric acid from your blood. This is the most common cause. Your body makes too much uric acid. This can happen with some cancers and cancer treatments. It can also occur if your body is breaking down too many red blood cells (hemolytic anemia). You eat too many foods that are high in purines. These foods include organ meats and some seafood. Alcohol, especially beer, is also high in purines. A gout attack may be triggered by trauma or stress. What increases the risk? The following factors may make you more likely to develop this condition: Having a family history of gout. Being female and middle-aged. Being female and having gone through menopause. Taking certain medicines, including aspirin, cyclosporine, diuretics, levodopa, and niacin. Having an organ transplant. Having certain conditions, such as: Being obese. Lead poisoning. Kidney disease. A skin condition called psoriasis. Other factors include: Losing weight too quickly. Being dehydrated. Frequently drinking alcohol, especially beer. Frequently drinking beverages that are sweetened with a type of sugar called  fructose. What are the signs or symptoms? An attack of acute gout happens quickly. It usually occurs in just one joint. The most common place is the big toe. Attacks often start at night. Other joints that may be affected include joints of the feet, ankle, knee, fingers, wrist, or elbow. Symptoms of this condition may include: Severe pain. Warmth. Swelling. Stiffness. Tenderness. The affected joint may be very painful to touch. Shiny, red, or purple skin. Chills and fever. Chronic gout may cause symptoms more frequently. More joints may be involved. You may also have white or yellow lumps (tophi) on your hands or feet or in other areas near your joints. How is this diagnosed? This condition is diagnosed based on your symptoms, your medical history, and a physical exam. You may have tests, such as: Blood tests to measure uric acid levels. Removal of joint fluid with a thin needle (aspiration) to look for uric acid crystals. X-rays to look for joint damage. How is this treated? Treatment for this condition has two phases: treating an acute attack and preventing future attacks. Acute gout treatment may include medicines to reduce pain and swelling, including: NSAIDs, such as ibuprofen. Steroids. These are strong anti-inflammatory medicines that can be taken by mouth (orally) or injected into a joint. Colchicine. This medicine relieves pain and swelling when it is taken soon after an attack. It can be given by mouth or through an IV. Preventive treatment may include: Daily use of smaller doses of NSAIDs or colchicine. Use of a medicine that reduces uric acid levels in your blood, such as allopurinol. Changes to your diet. You may need to see  a dietitian about what to eat and drink to prevent gout. Follow these instructions at home: During a gout attack  If directed, put ice on the affected area. To do this: Put ice in a plastic bag. Place a towel between your skin and the bag. Leave the  ice on for 20 minutes, 2-3 times a day. Remove the ice if your skin turns bright red. This is very important. If you cannot feel pain, heat, or cold, you have a greater risk of damage to the area. Raise (elevate) the affected joint above the level of your heart as often as possible. Rest the joint as much as possible. If the affected joint is in your leg, you may be given crutches to use. Follow instructions from your health care provider about eating or drinking restrictions. Avoiding future gout attacks Follow a low-purine diet as told by your dietitian or health care provider. Avoid foods and drinks that are high in purines, including liver, kidney, anchovies, asparagus, herring, mushrooms, mussels, and beer. Maintain a healthy weight or lose weight if you are overweight. If you want to lose weight, talk with your health care provider. Do not lose weight too quickly. Start or maintain an exercise program as told by your health care provider. Eating and drinking Avoid drinking beverages that contain fructose. Drink enough fluids to keep your urine pale yellow. If you drink alcohol: Limit how much you have to: 0-1 drink a day for women who are not pregnant. 0-2 drinks a day for men. Know how much alcohol is in a drink. In the [place] one drink equals one 12 oz bottle of beer (355 mL), one 5 oz glass of wine (148 mL), or one 1 oz glass of hard liquor (44 mL). General instructions Take over-the-counter and prescription medicines only as told by your health care provider. Ask your health care provider if the medicine prescribed to you requires you to avoid driving or using machinery. Return to your normal activities as told by your health care provider. Ask your health care provider what activities are safe for you. Keep all follow-up visits. This is important. Where to find more information [employer] [facility]: www.niams.[employer].es Contact a health care provider if you have: Another  gout attack. Continuing symptoms of a gout attack after 10 days of treatment. Side effects from your medicines. Chills or a fever. Burning pain when you urinate. Pain in your lower back or abdomen. Get help right away if you: Have severe or uncontrolled pain. Cannot urinate. Summary Gout is painful swelling of the joints caused by having too much uric acid in the body. The most common site for gout to occur is in the big toe, but it can affect other joints in the body. Medicines and dietary changes can help to prevent and treat gout attacks. This information is not intended to replace advice given to you by your health care provider. Make sure you discuss any questions you have with your health care provider. Document Revised: 06/04/2021 Document Reviewed: 06/04/2021 Elsevier Patient Education  [employer].

## 2022-08-13 NOTE — Progress Notes (Signed)
Subjective:  Patient ID: [name], female    DOB: [birth year]  Age: 76 y.o. MRN: [id number]  CC: Hypertension   HPI Valerie Jackson presents for f/up -  She complains of a 4 day history of non-traumatic pain/swelling in her right foot at the base of the great toe.  Outpatient Medications Prior to Visit  Medication Sig Dispense Refill   acetaminophen (TYLENOL) 500 MG tablet Take 1 tablet (500 mg total) by mouth every 6 (six) hours as needed. 30 tablet 0   allopurinol (ZYLOPRIM) 100 MG tablet Take 1 tablet (100 mg total) by mouth daily. 90 tablet 1   ALPRAZolam (XANAX) 0.5 MG tablet TAKE ONE TABLET BY MOUTH AT BEDTIME AS NEEDED FOR ANXIETY 30 tablet 2   aspirin EC 81 MG tablet Take 81 mg by mouth daily. Swallow whole.     cholecalciferol (VITAMIN D) 1000 UNITS tablet Take 2,000 Units by mouth daily.     colestipol (COLESTID) 1 g tablet Take 1 tablet (1 g total) by mouth 2 (two) times daily. 60 tablet 11   folic acid (FOLVITE) 1 MG tablet TAKE ONE TABLET BY MOUTH ONE TIME DAILY 90 tablet 0   indapamide (LOZOL) 1.25 MG tablet Take 1.25 mg by mouth daily.     levothyroxine (SYNTHROID) 125 MCG tablet TAKE ONE TABLET BY MOUTH ONE TIME DAILY 90 tablet 0   nebivolol (BYSTOLIC) 5 MG tablet Take 1 tablet (5 mg total) by mouth daily. 90 tablet 0   potassium chloride SA (KLOR-CON M15) 15 MEQ tablet Take 1 tablet (15 mEq total) by mouth 3 (three) times daily. 270 tablet 0   rizatriptan (MAXALT-MLT) 10 MG disintegrating tablet Take 1 tablet (10 mg total) by mouth as needed for migraine. May repeat in 2 hours if needed 30 tablet 1   SYRINGE-NEEDLE, DISP, 3 ML 25G X 5/8" 3 ML MISC 1 each by Does not apply route every 30 (thirty) days. 50 each 0   colchicine 0.6 MG tablet TAKE ONE TABLET BY MOUTH ONE TIME DAILY 90 tablet 0   cyanocobalamin (,VITAMIN B-12,) 1000 MCG/ML injection Inject 1 mL (1,000 mcg total) into the muscle every 30 (thirty) days. 4 mL 3   HYDROcodone-acetaminophen (NORCO/VICODIN)  5-325 MG tablet Take 1 tablet by mouth every 6 (six) hours as needed. 30 tablet 0   No facility-administered medications prior to visit.    ROS Review of Systems  Constitutional: Negative.  Negative for chills, fatigue and unexpected weight change.  HENT: Negative.    Eyes: Negative.   Respiratory: Negative.  Negative for cough, chest tightness, shortness of breath and wheezing.   Cardiovascular:  Negative for chest pain, palpitations and leg swelling.  Gastrointestinal:  Negative for abdominal pain, diarrhea, nausea and vomiting.  Endocrine: Negative.   Genitourinary: Negative.  Negative for difficulty urinating.  Musculoskeletal:  Positive for arthralgias and joint swelling.  Skin:  Negative for color change and pallor.  Neurological:  Negative for dizziness and weakness.  Hematological:  Negative for adenopathy. Does not bruise/bleed easily.  Psychiatric/Behavioral: Negative.  Negative for self-injury.     Objective:  BP 138/88 (BP Location: Left Arm, Patient Position: Sitting, Cuff Size: Large)   Pulse 78   Temp 97.9 F (36.6 C) (Oral)   Ht 5' 5"  (1.651 m)   Wt 195 lb (88.5 kg)   SpO2 96%   BMI 32.45 kg/m   BP Readings from Last 3 Encounters:  08/13/22 138/88  03/25/22 112/79  01/12/22 132/82  Wt Readings from Last 3 Encounters:  08/13/22 195 lb (88.5 kg)  01/12/22 196 lb (88.9 kg)  11/20/21 206 lb 6.4 oz (93.6 kg)    Physical Exam Vitals reviewed.  Constitutional:      Appearance: She is not ill-appearing.  HENT:     Mouth/Throat:     Mouth: Mucous membranes are moist.  Eyes:     General: No scleral icterus.    Conjunctiva/sclera: Conjunctivae normal.  Cardiovascular:     Rate and Rhythm: Normal rate and regular rhythm.     Heart sounds: No murmur heard. Pulmonary:     Effort: Pulmonary effort is normal.     Breath sounds: No stridor. No wheezing, rhonchi or rales.  Abdominal:     General: Abdomen is flat.     Palpations: There is no mass.      Tenderness: There is no abdominal tenderness. There is no guarding.     Hernia: No hernia is present.  Musculoskeletal:        General: Swelling, tenderness and deformity present.     Cervical back: Neck supple.     Comments: There is ttp and swelling in the right 1st MTP joint. The skin is intact.  Lymphadenopathy:     Cervical: No cervical adenopathy.  Skin:    General: Skin is warm.  Neurological:     General: No focal deficit present.     Lab Results  Component Value Date   WBC 7.1 01/12/2022   HGB 13.5 01/12/2022   HCT 39.3 01/12/2022   PLT 230.0 01/12/2022   GLUCOSE 131 (H) 01/12/2022   CHOL 214 (H) 07/14/2021   TRIG 152.0 (H) 07/14/2021   HDL 60.10 07/14/2021   LDLDIRECT 165.0 06/17/2020   LDLCALC 123 (H) 07/14/2021   ALT 44 02/06/2021   AST 32 02/06/2021   NA 140 01/12/2022   K 3.0 (L) 01/12/2022   CL 102 01/12/2022   CREATININE 1.66 (H) 01/12/2022   BUN 8 01/12/2022   CO2 28 01/12/2022   TSH 5.74 (H) 01/12/2022   INR 1.02 06/24/2017   HGBA1C 5.0 04/10/2021    MM 3D SCREEN BREAST BILATERAL  Result Date: 04/21/2022 CLINICAL DATA:  Screening. EXAM: DIGITAL SCREENING BILATERAL MAMMOGRAM WITH TOMOSYNTHESIS AND CAD TECHNIQUE: Bilateral screening digital craniocaudal and mediolateral oblique mammograms were obtained. Bilateral screening digital breast tomosynthesis was performed. The images were evaluated with computer-aided detection. COMPARISON:  Previous exam(s). ACR Breast Density Category b: There are scattered areas of fibroglandular density. FINDINGS: There are no findings suspicious for malignancy. IMPRESSION: No mammographic evidence of malignancy. A result letter of this screening mammogram will be mailed directly to the patient. RECOMMENDATION: Screening mammogram in one year. (Code:SM-B-01Y) BI-RADS CATEGORY  1: Negative. Electronically Signed   By: Abelardo Diesel M.D.   On: 04/21/2022 13:49    Assessment & Plan:   Valerie Jackson was seen today for  hypertension.  Diagnoses and all orders for this visit:  Gouty arthritis of right foot- She is having an acute flare. -     colchicine 0.6 MG tablet; Take 1 tablet (0.6 mg total) by mouth daily. -     methylPREDNISolone acetate (DEPO-MEDROL) injection 120 mg -     Discontinue: oxyCODONE (OXY IR/ROXICODONE) 5 MG immediate release tablet; Take 1 tablet (5 mg total) by mouth every 4 (four) hours as needed for up to 7 days for severe pain. -     oxyCODONE (OXY IR/ROXICODONE) 5 MG immediate release tablet; Take 1 tablet (5 mg total)  by mouth every 4 (four) hours as needed for up to 7 days for severe pain.  B12 deficiency -     cyanocobalamin (VITAMIN B12) 1000 MCG/ML injection; Inject 1 mL (1,000 mcg total) into the muscle every 30 (thirty) days.  Flu vaccine need -     Flu Vaccine QUAD High Dose(Fluad)   I have discontinued Valerie Jackson R. Manzer's HYDROcodone-acetaminophen. I have also changed her cyanocobalamin and colchicine. Additionally, I am having her maintain her cholecalciferol, acetaminophen, allopurinol, nebivolol, colestipol, potassium chloride SA, SYRINGE-NEEDLE (DISP) 3 ML, indapamide, aspirin EC, ALPRAZolam, rizatriptan, levothyroxine, folic acid, and oxyCODONE. We administered methylPREDNISolone acetate.  Meds ordered this encounter  Medications   cyanocobalamin (VITAMIN B12) 1000 MCG/ML injection    Sig: Inject 1 mL (1,000 mcg total) into the muscle every 30 (thirty) days.    Dispense:  4 mL    Refill:  3   colchicine 0.6 MG tablet    Sig: Take 1 tablet (0.6 mg total) by mouth daily.    Dispense:  90 tablet    Refill:  0   methylPREDNISolone acetate (DEPO-MEDROL) injection 120 mg   DISCONTD: oxyCODONE (OXY IR/ROXICODONE) 5 MG immediate release tablet    Sig: Take 1 tablet (5 mg total) by mouth every 4 (four) hours as needed for up to 7 days for severe pain.    Dispense:  30 tablet    Refill:  0   oxyCODONE (OXY IR/ROXICODONE) 5 MG immediate release tablet    Sig: Take 1  tablet (5 mg total) by mouth every 4 (four) hours as needed for up to 7 days for severe pain.    Dispense:  30 tablet    Refill:  0     Follow-up: Return in about 3 weeks (around 09/03/2022).  [name], MD

## 2022-08-18 DIAGNOSIS — N184 Chronic kidney disease, stage 4 (severe): Secondary | ICD-10-CM | POA: Diagnosis not present

## 2022-08-26 DIAGNOSIS — N2581 Secondary hyperparathyroidism of renal origin: Secondary | ICD-10-CM | POA: Diagnosis not present

## 2022-08-26 DIAGNOSIS — D631 Anemia in chronic kidney disease: Secondary | ICD-10-CM | POA: Diagnosis not present

## 2022-08-26 DIAGNOSIS — N1832 Chronic kidney disease, stage 3b: Secondary | ICD-10-CM | POA: Diagnosis not present

## 2022-08-26 DIAGNOSIS — I129 Hypertensive chronic kidney disease with stage 1 through stage 4 chronic kidney disease, or unspecified chronic kidney disease: Secondary | ICD-10-CM | POA: Diagnosis not present

## 2022-09-16 ENCOUNTER — Other Ambulatory Visit: Payer: Self-pay | Admitting: Internal Medicine

## 2022-09-16 DIAGNOSIS — E039 Hypothyroidism, unspecified: Secondary | ICD-10-CM

## 2022-10-01 ENCOUNTER — Ambulatory Visit
Admission: RE | Admit: 2022-10-01 | Discharge: 2022-10-01 | Disposition: A | Discharge status: Home / Self Care | Payer: Medicare Other | Source: Ambulatory Visit | Attending: Internal Medicine | Admitting: Internal Medicine

## 2022-10-01 DIAGNOSIS — M8088XA Other osteoporosis with current pathological fracture, vertebra(e), initial encounter for fracture: Secondary | ICD-10-CM

## 2022-10-01 DIAGNOSIS — M85852 Other specified disorders of bone density and structure, left thigh: Secondary | ICD-10-CM | POA: Diagnosis not present

## 2022-10-01 DIAGNOSIS — Z13820 Encounter for screening for osteoporosis: Secondary | ICD-10-CM

## 2022-10-01 DIAGNOSIS — Z780 Asymptomatic menopausal state: Secondary | ICD-10-CM | POA: Diagnosis not present

## 2022-10-06 DIAGNOSIS — H43813 Vitreous degeneration, bilateral: Secondary | ICD-10-CM | POA: Diagnosis not present

## 2022-10-06 DIAGNOSIS — H04123 Dry eye syndrome of bilateral lacrimal glands: Secondary | ICD-10-CM | POA: Diagnosis not present

## 2022-10-06 DIAGNOSIS — H26493 Other secondary cataract, bilateral: Secondary | ICD-10-CM | POA: Diagnosis not present

## 2022-10-06 DIAGNOSIS — H524 Presbyopia: Secondary | ICD-10-CM | POA: Diagnosis not present

## 2022-10-06 DIAGNOSIS — H52203 Unspecified astigmatism, bilateral: Secondary | ICD-10-CM | POA: Diagnosis not present

## 2022-10-20 ENCOUNTER — Other Ambulatory Visit: Payer: Self-pay | Admitting: Internal Medicine

## 2022-10-20 DIAGNOSIS — M109 Gout, unspecified: Secondary | ICD-10-CM

## 2022-10-21 ENCOUNTER — Telehealth: Payer: Self-pay

## 2022-10-21 ENCOUNTER — Other Ambulatory Visit (HOSPITAL_COMMUNITY): Payer: Self-pay

## 2022-10-21 NOTE — Telephone Encounter (Signed)
Prolia VOB initiated via [email or web address] on 10/21/22.  Id: [id number]  Status is in progress.

## 2022-10-24 ENCOUNTER — Ambulatory Visit
Admission: EM | Admit: 2022-10-24 | Discharge: 2022-10-24 | Disposition: A | Discharge status: Home / Self Care | Payer: Medicare Other

## 2022-10-24 ENCOUNTER — Telehealth: Payer: Self-pay

## 2022-10-24 DIAGNOSIS — M109 Gout, unspecified: Secondary | ICD-10-CM | POA: Diagnosis not present

## 2022-10-24 MED ORDER — METHYLPREDNISOLONE SODIUM SUCC 125 MG IJ SOLR: 60.0000 mg | Freq: Once | INTRAMUSCULAR | Status: DC

## 2022-10-24 MED ORDER — METHYLPREDNISOLONE ACETATE 40 MG/ML IJ SUSP: 60.0000 mg | Freq: Once | INTRAMUSCULAR | Status: DC

## 2022-10-24 MED ORDER — COLCHICINE 0.6 MG PO TABS: 0.6000 mg | ORAL_TABLET | Freq: Every day | ORAL | 0 refills | Status: DC

## 2022-10-24 MED ORDER — PREDNISONE 20 MG PO TABS: 40.0000 mg | ORAL_TABLET | Freq: Every day | ORAL | 0 refills | Status: DC

## 2022-10-24 MED ORDER — PREDNISONE 20 MG PO TABS: 40.0000 mg | ORAL_TABLET | Freq: Every day | ORAL | 0 refills | Status: AC

## 2022-10-24 MED ORDER — METHYLPREDNISOLONE ACETATE 80 MG/ML IJ SUSP
60.0000 mg | Freq: Once | INTRAMUSCULAR | Status: AC
  Administered 2022-10-24: 60 mg via INTRAMUSCULAR

## 2022-10-24 NOTE — ED Triage Notes (Addendum)
Patient states she has gout and has been dealing with a flare up in the right foot radiating upward. Pt needing refill on colchicine.   Started: Thursday   Home interventions: tylenol/ codeine

## 2022-10-24 NOTE — Discharge Instructions (Addendum)
Start oral prednisone tomorrow as you were given an injection today of a steroid while in clinic Colchicine as prescribed Follow-up with your PCP if symptoms do not improve Please go to the ER for any worsening symptoms

## 2022-10-24 NOTE — ED Provider Notes (Signed)
[facility]    CSN: [id number] Arrival date & time: 10/24/22  [id number]      History   Chief Complaint Chief Complaint  Patient presents with   Foot Pain   Nausea    HPI Valerie Jackson is a 77 y.o. female presents for evaluation of gout flare.  Patient reports 2 days of pain, redness, swelling, warmth at the base of her right toe.  She states this is a typical location and presentation for gout flares.  She is on allopurinol daily.  She denies any change in diet.  She normally has a refill of colchicine but is out.  She has been taking Tylenol OTC.  Denies any injury to the area no fevers or chills.  No other concerns at this time.   Foot Pain    Past Medical History:  Diagnosis Date   Arthritis    fingers   Blood clot in vein 1968   Cataracts, bilateral    CHF (congestive heart failure) (HCC)    Chronic kidney disease    CKD stage 2   COPD (chronic obstructive pulmonary disease) (HCC)    Depression    Diverticulitis 09/2013   with perforation   Dizziness    with decreased BP readings or bending over   Floaters    GERD (gastroesophageal reflux disease)    H/O hiatal hernia    Headache(784.0)    migraine every 5- to 6 years   Heart murmur since age 73   mild   Hepatitis age 24 or 61   had heapatitis, not sure which kind   History of blood transfusion 1965   History of frequent urinary tract infections    Hypercholesteremia    Hypertension    Hypothyroid    Obesity    Pericolonic abscess 2015   Pneumonia    Sickle cell trait ([place])     Patient Active Problem List   Diagnosis Date Noted   Gouty arthritis of right foot 08/13/2022   Acute cystitis without hematuria 04/09/2022   Dysuria 04/06/2022   NASH (nonalcoholic steatohepatitis) 04/13/2021   Atherosclerosis of aorta ([place]) 04/11/2021   Hyperglycemia 04/10/2021   Folate deficiency 04/10/2021   Vitamin D deficiency 11/20/2020   Idiopathic chronic gout of multiple sites without tophus 06/17/2020    Diuretic-induced hypokalemia 11/17/2019   B12 deficiency 11/21/2018   Asthma 08/19/2017   Eczema 06/29/2017   Hyperparathyroidism ([place]) 04/07/2017   Migraine without aura and without status migrainosus, not intractable 11/11/2016   Symptomatic PVCs 05/19/2016   Osteopenia 08/23/2015   Insomnia due to anxiety and fear 09/18/2014   Esophageal reflux 04/06/2014   CKD (chronic kidney disease) stage 2, GFR 60-89 ml/min 03/03/2014   Emphysema lung ([place]) 11/08/2013   Sickle cell trait ([place]) 07/19/2013   Obesity (BMI 30-39.9) 04/12/2013   Hyperlipidemia with target LDL less than 160 02/28/2013   Hypertension 02/28/2013   Hypothyroidism 02/28/2013    Past Surgical History:  Procedure Laterality Date   CHOLECYSTECTOMY  2002   COLON RESECTION N/A 10/11/2013   Procedure: EXPLORATORY LAPAROTOMY WITH RECTOSIGMOID COLECTOMY AND END COLOSTOMY;  Surgeon: Stark Klein, MD;  Location: [facility];  Service: General;  Laterality: N/A;   COLOSTOMY TAKEDOWN N/A 02/27/2014   Procedure: COLOSTOMY TAKEDOWN [name] HERNIA REPAIR;  Surgeon: Stark Klein, MD;  Location: [facility];  Service: General;  Laterality: N/A;   INCISIONAL HERNIA REPAIR N/A 03/04/2016   Procedure: LAPAROSCOPIC INCISIONAL HERNIA WITH MESH;  Surgeon: Ralene Ok, MD;  Location: [facility];  Service: General;  Laterality: N/A;   LAPAROSCOPIC LYSIS OF ADHESIONS N/A 03/04/2016   Procedure: LAPAROSCOPIC LYSIS OF ADHESIONS;  Surgeon: Ralene Ok, MD;  Location: [facility];  Service: General;  Laterality: N/A;   RIGHT HEART CATH N/A 06/24/2017   Procedure: RIGHT HEART CATH;  Surgeon: Jolaine Artist, MD;  Location: [facility];  Service: Cardiovascular;  Laterality: N/A;   TONSILLECTOMY  1952    OB History     Gravida  1   Para  1   Term      Preterm      AB      Living  2      SAB      IAB      Ectopic      Multiple      Live Births  1            Home Medications    Prior to Admission medications    Medication Sig Start Date End Date Taking? Authorizing Provider  amLODipine (NORVASC) 5 MG tablet Take 5 mg by mouth daily.   Yes [provider]  colchicine 0.6 MG tablet Take 1 tablet (0.6 mg total) by mouth daily. Take 1.2 mg (2 tablets) once and then take third tablet one hour later 10/24/22  Yes Melynda Ripple, NP  predniSONE (DELTASONE) 20 MG tablet Take 2 tablets (40 mg total) by mouth daily with breakfast for 5 days. 10/25/22 10/30/22 Yes Melynda Ripple, NP  acetaminophen (TYLENOL) 500 MG tablet Take 1 tablet (500 mg total) by mouth every 6 (six) hours as needed. 09/25/19   Tedd Sias, PA  allopurinol (ZYLOPRIM) 100 MG tablet Take 1 tablet (100 mg total) by mouth daily. 06/23/20   Janith Lima, MD  ALPRAZolam Duanne Moron) 0.5 MG tablet TAKE ONE TABLET BY MOUTH AT BEDTIME AS NEEDED FOR ANXIETY 04/06/22   Janith Lima, MD  aspirin EC 81 MG tablet Take 81 mg by mouth daily. Swallow whole.    [provider]  cholecalciferol (VITAMIN D) 1000 UNITS tablet Take 2,000 Units by mouth daily.    [provider]  colestipol (COLESTID) 1 g tablet Take 1 tablet (1 g total) by mouth 2 (two) times daily. 04/10/21   Irene Shipper, MD  cyanocobalamin (VITAMIN B12) 1000 MCG/ML injection Inject 1 mL (1,000 mcg total) into the muscle every 30 (thirty) days. 08/13/22   Janith Lima, MD  folic acid (FOLVITE) 1 MG tablet TAKE ONE TABLET BY MOUTH ONE TIME DAILY 07/01/22   Janith Lima, MD  indapamide (LOZOL) 1.25 MG tablet Take 1.25 mg by mouth daily.    [provider]  levothyroxine (SYNTHROID) 125 MCG tablet TAKE ONE TABLET BY MOUTH ONE TIME DAILY 09/16/22   Janith Lima, MD  nebivolol (BYSTOLIC) 5 MG tablet Take 1 tablet (5 mg total) by mouth daily. 02/27/21   Janith Lima, MD  potassium chloride SA (KLOR-CON M15) 15 MEQ tablet Take 1 tablet (15 mEq total) by mouth 3 (three) times daily. 01/12/22   Janith Lima, MD  rizatriptan (MAXALT-MLT) 10 MG disintegrating  tablet Take 1 tablet (10 mg total) by mouth as needed for migraine. May repeat in 2 hours if needed 04/06/22   Janith Lima, MD  SYRINGE-NEEDLE, DISP, 3 ML 25G X 5/8" 3 ML MISC 1 each by Does not apply route every 30 (thirty) days. 01/13/22   Janith Lima, MD    Family History Family  History  Problem Relation Age of Onset   Hypertension Mother    Aneurysm Mother        Cerebral (died from this)   Cancer Mother        Uterine   Cancer Other    Arthritis Sister    Hyperlipidemia Sister    Hypertension Sister    Stroke Sister    Kidney disease Sister        Cancer   Hyperlipidemia Brother    Glaucoma Brother    Thyroid cancer Daughter    Breast cancer Maternal Grandmother 78   Hypertension Maternal Grandmother    Hypercalcemia Neg Hx     Social History Social History   Tobacco Use   Smoking status: Former    Packs/day: 0.50    Years: 52.00    Total pack years: 26.00    Types: Cigarettes    Quit date: 09/06/2012    Years since quitting: 10.1   Smokeless tobacco: Never   Tobacco comments:    never smoked a full PPD.   Vaping Use   Vaping Use: Never used  Substance Use Topics   Alcohol use: No    Alcohol/week: 0.0 standard drinks of alcohol    Comment: very rarely   Drug use: No     Allergies   Crestor [rosuvastatin] and Estrogens   Review of Systems Review of Systems  Musculoskeletal:        Redness and pain and swelling to the base of the right big toe     Physical Exam Triage Vital Signs ED Triage Vitals  Enc Vitals Group     BP 10/24/22 1056 137/89     Pulse Rate 10/24/22 1056 91     Resp 10/24/22 1056 18     Temp 10/24/22 1056 98.2 F (36.8 C)     Temp Source 10/24/22 1056 Oral     SpO2 10/24/22 1056 93 %     Weight --      Height --      Head Circumference --      Peak Flow --      Pain Score 10/24/22 1052 8     Pain Loc --      Pain Edu? --      Excl. in [place]? --    No data found.  Updated Vital Signs BP 137/89 (BP Location: Left  Arm)   Pulse 91   Temp 98.2 F (36.8 C) (Oral)   Resp 18   SpO2 93%   Visual Acuity Right Eye Distance:   Left Eye Distance:   Bilateral Distance:    Right Eye Near:   Left Eye Near:    Bilateral Near:     Physical Exam Vitals and nursing note reviewed.  Constitutional:      Appearance: Normal appearance.  HENT:     Head: Normocephalic and atraumatic.  Eyes:     Pupils: Pupils are equal, round, and reactive to light.  Cardiovascular:     Rate and Rhythm: Normal rate.  Pulmonary:     Effort: Pulmonary effort is normal.  Musculoskeletal:       Feet:  Skin:    General: Skin is warm and dry.  Neurological:     General: No focal deficit present.     Mental Status: She is alert and oriented to person, place, and time.  Psychiatric:        Mood and Affect: Mood normal.        Behavior: Behavior  normal.      UC Treatments / Results  Labs (all labs ordered are listed, but only abnormal results are displayed) Labs Reviewed - No data to display  EKG   Radiology No results found.  Procedures Procedures (including critical care time)  Medications Ordered in UC Medications  methylPREDNISolone acetate (DEPO-MEDROL) injection 60 mg (has no administration in time range)    Initial Impression / Assessment and Plan / UC Course  I have reviewed the triage vital signs and the nursing notes.  Pertinent labs & imaging results that were available during my care of the patient were reviewed by me and considered in my medical decision making (see chart for details).     Reviewed exam and symptoms with patient.  No red flags on exam. Patient requested IM steroid in clinic.  She was given Solu-Medrol 60 mg IM.  She was monitored for 5 minutes after injection with no reaction noted and tolerated well She will start oral prednisone tomorrow Colchicine today Patient to follow-up with PCP in 2 to 3 days for recheck ER precautions reviewed and patient verbalized  understanding Final Clinical Impressions(s) / UC Diagnoses   Final diagnoses:  Acute gout involving toe of right foot, unspecified cause     Discharge Instructions      Start oral prednisone tomorrow as you were given an injection today of a steroid while in clinic Colchicine as prescribed Follow-up with your PCP if symptoms do not improve Please go to the ER for any worsening symptoms    ED Prescriptions     Medication Sig Dispense Auth. Provider   colchicine 0.6 MG tablet Take 1 tablet (0.6 mg total) by mouth daily. Take 1.2 mg (2 tablets) once and then take third tablet one hour later 3 tablet Melynda Ripple, NP   predniSONE (DELTASONE) 20 MG tablet Take 2 tablets (40 mg total) by mouth daily with breakfast for 5 days. 10 tablet Melynda Ripple, NP      PDMP not reviewed this encounter.   Melynda Ripple, NP 10/24/22 1126

## 2022-10-26 ENCOUNTER — Other Ambulatory Visit (HOSPITAL_COMMUNITY): Payer: Self-pay

## 2022-10-26 NOTE — Telephone Encounter (Signed)
Received a fax regarding medical coverage for Prolia.  Your patient's [place] advised that Prolia is not covered thorough you patient's Medical benefit at this time, or that the proposed use of Prolia is not consistent with the payer's policy and therefore is not covered.   Prolia letter is attached to chart.

## 2022-11-02 NOTE — Telephone Encounter (Signed)
Resubmitted Prolia VOB via [email or web address]

## 2022-11-16 ENCOUNTER — Encounter: Payer: Self-pay | Admitting: Internal Medicine

## 2022-11-18 ENCOUNTER — Other Ambulatory Visit (HOSPITAL_COMMUNITY): Payer: Self-pay

## 2022-11-18 NOTE — Telephone Encounter (Signed)
Pt ready for scheduling for Prolia on or after : 11/18/22  Out-of-pocket cost due at time of visit: $101.43  Primary: [facility] Prolia co-insurance: 0% Admin fee co-insurance: 0%  Secondary: [employer] - Medicare supplement Prolia co-insurance:  Admin fee co-insurance:   Medical Benefit Details: Date Benefits were checked: 11/18/22 Deductible: $138.57 met of $240 required/ Coinsurance: 0%/ Admin Fee: 0%  Prior Auth: not required PA# Expiration Date:    Pharmacy benefit: Copay $816.22 If patient wants fill through the pharmacy benefit please send prescription to: [facility] and include estimated need by date in rx notes. Pharmacy will ship medication directly to the office.  Patient NOT eligible for Prolia Copay Card. Copay Card can make patient's cost as little as $25. Link to apply: [email or web address]://www.amgensupportplus.[email or web address]/copay  ** This summary of benefits is an estimation of the patient's out-of-pocket cost. Exact cost may very based on individual plan coverage.

## 2022-11-18 NOTE — Telephone Encounter (Signed)
Called pt gave her insurance coverage on Prolia. Pt agreed and made appt for 11/26/22.Marland KitchenJohny Chess

## 2022-11-26 ENCOUNTER — Ambulatory Visit: Payer: Medicare Other | Admitting: Radiology

## 2022-11-26 DIAGNOSIS — M8088XA Other osteoporosis with current pathological fracture, vertebra(e), initial encounter for fracture: Secondary | ICD-10-CM

## 2022-11-26 MED ORDER — DENOSUMAB 60 MG/ML ~~LOC~~ SOSY
60.0000 mg | PREFILLED_SYRINGE | Freq: Once | SUBCUTANEOUS | Status: AC
  Administered 2022-11-26: 60 mg via SUBCUTANEOUS

## 2022-11-30 ENCOUNTER — Encounter: Payer: Self-pay | Admitting: Internal Medicine

## 2022-11-30 NOTE — Progress Notes (Signed)
Pt rec'd prolia and tolerated well.

## 2022-12-01 NOTE — Progress Notes (Signed)
Prolia given. Pt tolerated well.

## 2022-12-12 ENCOUNTER — Other Ambulatory Visit: Payer: Self-pay | Admitting: Internal Medicine

## 2022-12-12 DIAGNOSIS — E039 Hypothyroidism, unspecified: Secondary | ICD-10-CM

## 2022-12-15 ENCOUNTER — Other Ambulatory Visit: Payer: Self-pay | Admitting: Internal Medicine

## 2022-12-15 DIAGNOSIS — E039 Hypothyroidism, unspecified: Secondary | ICD-10-CM

## 2022-12-30 ENCOUNTER — Other Ambulatory Visit: Payer: Self-pay | Admitting: Internal Medicine

## 2022-12-30 DIAGNOSIS — E039 Hypothyroidism, unspecified: Secondary | ICD-10-CM

## 2023-01-01 ENCOUNTER — Other Ambulatory Visit: Payer: Self-pay | Admitting: Internal Medicine

## 2023-01-01 DIAGNOSIS — E039 Hypothyroidism, unspecified: Secondary | ICD-10-CM

## 2023-01-01 DIAGNOSIS — N1832 Chronic kidney disease, stage 3b: Secondary | ICD-10-CM | POA: Diagnosis not present

## 2023-01-01 NOTE — Telephone Encounter (Signed)
Pt states she is sitting outside of her pharmacy right now so she can pick it up. Pt is out and is needing a refill .

## 2023-01-02 LAB — LAB REPORT - SCANNED
Albumin, Urine POC: 4.4
Creatinine, POC: 1.61 mg/dL
EGFR: 33

## 2023-01-04 ENCOUNTER — Other Ambulatory Visit: Payer: Self-pay | Admitting: Internal Medicine

## 2023-01-04 DIAGNOSIS — E039 Hypothyroidism, unspecified: Secondary | ICD-10-CM

## 2023-01-04 LAB — COMPREHENSIVE METABOLIC PANEL
Albumin: 4.4 (ref 3.5–5.0)
Calcium: 9.9 (ref 8.7–10.7)
eGFR: 33

## 2023-01-04 LAB — VITAMIN D 25 HYDROXY (VIT D DEFICIENCY, FRACTURES): Vit D, 25-Hydroxy: 16

## 2023-01-04 LAB — CBC AND DIFFERENTIAL
HCT: 40 (ref 36–46)
Hemoglobin: 13.4 (ref 12.0–16.0)
Platelets: 196 10*3/uL (ref 150–400)
WBC: 8.2

## 2023-01-04 LAB — BASIC METABOLIC PANEL
BUN: 11 (ref 4–21)
CO2: 26 — AB (ref 13–22)
Chloride: 106 (ref 99–108)
Creatinine: 1.6 — AB (ref 0.5–1.1)
Glucose: 106
Potassium: 3.6 mEq/L (ref 3.5–5.1)
Sodium: 142 (ref 137–147)

## 2023-01-05 ENCOUNTER — Ambulatory Visit (INDEPENDENT_AMBULATORY_CARE_PROVIDER_SITE_OTHER): Payer: Medicare Other | Admitting: Internal Medicine

## 2023-01-05 ENCOUNTER — Telehealth: Payer: Self-pay | Admitting: Internal Medicine

## 2023-01-05 ENCOUNTER — Encounter: Payer: Self-pay | Admitting: Internal Medicine

## 2023-01-05 VITALS — BP 156/94 | HR 82 | Temp 97.8°F | Resp 16 | Ht 65.0 in | Wt 200.2 lb

## 2023-01-05 DIAGNOSIS — I700 Atherosclerosis of aorta: Secondary | ICD-10-CM

## 2023-01-05 DIAGNOSIS — E785 Hyperlipidemia, unspecified: Secondary | ICD-10-CM

## 2023-01-05 DIAGNOSIS — N1832 Chronic kidney disease, stage 3b: Secondary | ICD-10-CM | POA: Diagnosis not present

## 2023-01-05 DIAGNOSIS — E538 Deficiency of other specified B group vitamins: Secondary | ICD-10-CM | POA: Diagnosis not present

## 2023-01-05 DIAGNOSIS — R739 Hyperglycemia, unspecified: Secondary | ICD-10-CM | POA: Diagnosis not present

## 2023-01-05 DIAGNOSIS — M810 Age-related osteoporosis without current pathological fracture: Secondary | ICD-10-CM

## 2023-01-05 DIAGNOSIS — I10 Essential (primary) hypertension: Secondary | ICD-10-CM

## 2023-01-05 DIAGNOSIS — D631 Anemia in chronic kidney disease: Secondary | ICD-10-CM | POA: Diagnosis not present

## 2023-01-05 DIAGNOSIS — M1A09X0 Idiopathic chronic gout, multiple sites, without tophus (tophi): Secondary | ICD-10-CM

## 2023-01-05 DIAGNOSIS — I129 Hypertensive chronic kidney disease with stage 1 through stage 4 chronic kidney disease, or unspecified chronic kidney disease: Secondary | ICD-10-CM | POA: Diagnosis not present

## 2023-01-05 DIAGNOSIS — E039 Hypothyroidism, unspecified: Secondary | ICD-10-CM | POA: Diagnosis not present

## 2023-01-05 DIAGNOSIS — K7581 Nonalcoholic steatohepatitis (NASH): Secondary | ICD-10-CM

## 2023-01-05 DIAGNOSIS — N2581 Secondary hyperparathyroidism of renal origin: Secondary | ICD-10-CM | POA: Diagnosis not present

## 2023-01-05 DIAGNOSIS — R072 Precordial pain: Secondary | ICD-10-CM | POA: Insufficient documentation

## 2023-01-05 DIAGNOSIS — N182 Chronic kidney disease, stage 2 (mild): Secondary | ICD-10-CM

## 2023-01-05 LAB — LIPID PANEL
Cholesterol: 219 mg/dL — ABNORMAL HIGH (ref 0–200)
HDL: 52.4 mg/dL (ref >39.00)
LDL Cholesterol: 137 mg/dL — ABNORMAL HIGH (ref 0–99)
NonHDL: 166.83
Total CHOL/HDL Ratio: 4
Triglycerides: 148 mg/dL (ref 0.0–149.0)
VLDL: 29.6 mg/dL (ref 0.0–40.0)

## 2023-01-05 LAB — HEPATIC FUNCTION PANEL
ALT: 36 U/L — ABNORMAL HIGH (ref 0–35)
AST: 38 U/L — ABNORMAL HIGH (ref 0–37)
Albumin: 4.3 g/dL (ref 3.5–5.2)
Alkaline Phosphatase: 89 U/L (ref 39–117)
Bilirubin, Direct: 0.2 mg/dL (ref 0.0–0.3)
Total Bilirubin: 0.8 mg/dL (ref 0.2–1.2)
Total Protein: 7.2 g/dL (ref 6.0–8.3)

## 2023-01-05 LAB — TSH: TSH: 6.55 u[IU]/mL — ABNORMAL HIGH (ref 0.35–5.50)

## 2023-01-05 LAB — VITAMIN B12: Vitamin B-12: 701 pg/mL (ref 211–911)

## 2023-01-05 LAB — TROPONIN I (HIGH SENSITIVITY): High Sens Troponin I: 6 ng/L (ref 2–17)

## 2023-01-05 LAB — URIC ACID: Uric Acid, Serum: 5.2 mg/dL (ref 2.4–7.0)

## 2023-01-05 LAB — HEMOGLOBIN A1C: Hgb A1c MFr Bld: 4.8 % (ref 4.6–6.5)

## 2023-01-05 MED ORDER — NEBIVOLOL HCL 5 MG PO TABS: 5.0000 mg | ORAL_TABLET | Freq: Every day | ORAL | 0 refills | Status: DC

## 2023-01-05 MED ORDER — FOLIC ACID 1 MG PO TABS: 1.0000 mg | ORAL_TABLET | Freq: Every day | ORAL | 0 refills | Status: DC

## 2023-01-05 MED ORDER — UNITHROID 137 MCG PO TABS: 137.0000 ug | ORAL_TABLET | Freq: Every day | ORAL | 1 refills | Status: DC

## 2023-01-05 MED ORDER — HYDRALAZINE HCL 25 MG PO TABS: 25.0000 mg | ORAL_TABLET | Freq: Three times a day (TID) | ORAL | 0 refills | Status: DC

## 2023-01-05 MED ORDER — PITAVASTATIN CALCIUM 2 MG PO TABS: 2.0000 mg | ORAL_TABLET | Freq: Every day | ORAL | 1 refills | Status: DC

## 2023-01-05 MED ORDER — AMLODIPINE BESYLATE 5 MG PO TABS: 5.0000 mg | ORAL_TABLET | Freq: Every day | ORAL | 1 refills | Status: DC

## 2023-01-05 NOTE — Telephone Encounter (Signed)
Prescription Request  01/05/2023  LOV: 08/13/2022  What is the name of the medication or equipment?  levothyroxine (SYNTHROID) 125 MCG tablet  Have you contacted your pharmacy to request a refill? Yes   Which pharmacy would you like this sent to?  [id number] - [place], [place] - [address] [place] [address] Phone: [phone] Fax: [phone]    Patient notified that their request is being sent to the clinical staff for review and that they should receive a response within 2 business days.   Please advise at Mobile [phone] (mobile)

## 2023-01-05 NOTE — Progress Notes (Signed)
Subjective:  Patient ID: Valerie Jackson, female    DOB: [birth year]  Age: 77 y.o. MRN: [id number]  CC: Hypertension, Hypothyroidism, and Hyperlipidemia   HPI Valerie Jackson presents for f/up --  She complains of a several month history of substernal chest pain that she describes as twinges that occur only at rest.  She is not taking any antihypertensives.  She had labs done with nephrology earlier today.  She is active and denies dyspnea on exertion, diaphoresis, edema, palpitations, dizziness, or lightheadedness.  Outpatient Medications Prior to Visit  Medication Sig Dispense Refill   acetaminophen (TYLENOL) 500 MG tablet Take 1 tablet (500 mg total) by mouth every 6 (six) hours as needed. 30 tablet 0   allopurinol (ZYLOPRIM) 100 MG tablet Take 1 tablet (100 mg total) by mouth daily. 90 tablet 1   ALPRAZolam (XANAX) 0.5 MG tablet TAKE ONE TABLET BY MOUTH AT BEDTIME AS NEEDED FOR ANXIETY 30 tablet 2   aspirin EC 81 MG tablet Take 81 mg by mouth daily. Swallow whole.     cholecalciferol (VITAMIN D) 1000 UNITS tablet Take 2,000 Units by mouth daily.     colchicine 0.6 MG tablet Take 1 tablet (0.6 mg total) by mouth daily. Take 1.2 mg (2 tablets) once and then take third tablet one hour later 3 tablet 0   colestipol (COLESTID) 1 g tablet Take 1 tablet (1 g total) by mouth 2 (two) times daily. 60 tablet 11   cyanocobalamin (VITAMIN B12) 1000 MCG/ML injection Inject 1 mL (1,000 mcg total) into the muscle every 30 (thirty) days. 4 mL 3   indapamide (LOZOL) 1.25 MG tablet Take 1.25 mg by mouth daily.     rizatriptan (MAXALT-MLT) 10 MG disintegrating tablet Take 1 tablet (10 mg total) by mouth as needed for migraine. May repeat in 2 hours if needed 30 tablet 1   SYRINGE-NEEDLE, DISP, 3 ML 25G X 5/8" 3 ML MISC 1 each by Does not apply route every 30 (thirty) days. 50 each 0   folic acid (FOLVITE) 1 MG tablet TAKE ONE TABLET BY MOUTH ONE TIME DAILY 90 tablet 0   levothyroxine (SYNTHROID) 125 MCG  tablet TAKE ONE TABLET BY MOUTH ONE TIME DAILY 90 tablet 0   nebivolol (BYSTOLIC) 5 MG tablet Take 1 tablet (5 mg total) by mouth daily. 90 tablet 0   potassium chloride SA (KLOR-CON M15) 15 MEQ tablet Take 1 tablet (15 mEq total) by mouth 3 (three) times daily. 270 tablet 0   amLODipine (NORVASC) 5 MG tablet Take 5 mg by mouth daily. (Patient not taking: Reported on 01/05/2023)     No facility-administered medications prior to visit.    ROS Review of Systems  Constitutional:  Positive for unexpected weight change (wt gain). Negative for appetite change, diaphoresis and fatigue.  HENT: Negative.  Negative for sore throat and trouble swallowing.   Eyes: Negative.   Respiratory: Negative.  Negative for chest tightness, shortness of breath, wheezing and stridor.   Cardiovascular:  Positive for chest pain. Negative for palpitations and leg swelling.  Gastrointestinal:  Negative for abdominal pain, constipation, diarrhea, nausea and vomiting.  Endocrine: Negative.  Negative for cold intolerance and heat intolerance.  Genitourinary: Negative.  Negative for difficulty urinating.  Musculoskeletal: Negative.  Negative for myalgias.  Skin: Negative.   Neurological:  Negative for dizziness, weakness, light-headedness and headaches.  Hematological:  Negative for adenopathy. Does not bruise/bleed easily.  Psychiatric/Behavioral: Negative.      Objective:  BP (!) 156/94 (  BP Location: Left Arm, Patient Position: Sitting, Cuff Size: Normal)   Pulse 82   Temp 97.8 F (36.6 C) (Oral)   Resp 16   Ht 5\' 5"  (1.651 m)   Wt 200 lb 3.2 oz (90.8 kg)   SpO2 96%   BMI 33.32 kg/m   BP Readings from Last 3 Encounters:  01/05/23 (!) 156/94  10/24/22 137/89  08/13/22 138/88    Wt Readings from Last 3 Encounters:  01/05/23 200 lb 3.2 oz (90.8 kg)  08/13/22 195 lb (88.5 kg)  01/12/22 196 lb (88.9 kg)    Physical Exam Vitals reviewed.  Constitutional:      Appearance: She is not ill-appearing.   HENT:     Nose: Nose normal.     Mouth/Throat:     Mouth: Mucous membranes are moist.  Eyes:     General: No scleral icterus.    Conjunctiva/sclera: Conjunctivae normal.  Cardiovascular:     Rate and Rhythm: Normal rate and regular rhythm.     Heart sounds: Normal heart sounds, S1 normal and S2 normal. No murmur heard.    No friction rub. No gallop.     Comments: EKG- SR with PAC's, 88 bpm Diffusely flat T waves No LVH or Q waves Pulmonary:     Effort: Pulmonary effort is normal.     Breath sounds: No stridor. No wheezing, rhonchi or rales.  Abdominal:     General: Abdomen is flat.     Palpations: There is no mass.     Tenderness: There is no abdominal tenderness. There is no guarding.     Hernia: No hernia is present.  Musculoskeletal:     Cervical back: Neck supple.     Right lower leg: No edema.     Left lower leg: No edema.  Skin:    General: Skin is warm and dry.     Findings: No lesion.  Neurological:     General: No focal deficit present.     Mental Status: She is alert and oriented to person, place, and time.  Psychiatric:        Mood and Affect: Mood normal.        Behavior: Behavior normal.     Lab Results  Component Value Date   WBC 8.2 01/04/2023   HGB 13.4 01/04/2023   HCT 40 01/04/2023   PLT 196 01/04/2023   GLUCOSE 131 (H) 01/12/2022   CHOL 219 (H) 01/05/2023   TRIG 148.0 01/05/2023   HDL 52.40 01/05/2023   LDLDIRECT 165.0 06/17/2020   LDLCALC 137 (H) 01/05/2023   ALT 36 (H) 01/05/2023   AST 38 (H) 01/05/2023   NA 142 01/04/2023   K 3.6 01/04/2023   CL 106 01/04/2023   CREATININE 1.6 (A) 01/04/2023   BUN 11 01/04/2023   CO2 26 (A) 01/04/2023   TSH 6.55 (H) 01/05/2023   INR 1.02 06/24/2017   HGBA1C 4.8 01/05/2023    No results found.  Assessment & Plan:   B12 deficiency -     Vitamin B12; Future  Acquired hypothyroidism- Her TSH is nearly 7 and she is symptomatic.  Will increase the T4 dosage. -     TSH; Future -     Unithroid;  Take 1 tablet (137 mcg total) by mouth daily before breakfast.  Dispense: 90 tablet; Refill: 1  CKD (chronic kidney disease) stage 2, GFR 60-89 ml/min  Folate deficiency -     Vitamin B12; Future -     Folic Acid;  Take 1 tablet (1 mg total) by mouth daily.  Dispense: 90 tablet; Refill: 0  Hyperglycemia -     Hemoglobin A1c; Future  Hyperlipidemia with target LDL less than 160 -     Lipid panel; Future -     Hepatic function panel; Future -     Pitavastatin Calcium; Take 1 tablet (2 mg total) by mouth daily.  Dispense: 90 tablet; Refill: 1 -     CT CARDIAC SCORING (SELF PAY ONLY); Future  Primary hypertension -     EKG 12-Lead -     Nebivolol HCl; Take 1 tablet (5 mg total) by mouth daily.  Dispense: 90 tablet; Refill: 0 -     amLODIPine Besylate; Take 1 tablet (5 mg total) by mouth daily.  Dispense: 90 tablet; Refill: 1 -     hydrALAZINE HCl; Take 1 tablet (25 mg total) by mouth 3 (three) times daily.  Dispense: 270 tablet; Refill: 0  Idiopathic chronic gout of multiple sites without tophus -     Uric acid; Future  NASH (nonalcoholic steatohepatitis) -     Hepatic function panel; Future  Age-related osteoporosis without current pathological fracture  Substernal precordial chest pain-EKG and labs are reassuring.  I recommended that she undergo a coronary calcium score. -     Troponin I (High Sensitivity); Future -     EKG 12-Lead -     CT CARDIAC SCORING (SELF PAY ONLY); Future  Atherosclerosis of aorta (HCC) -     Pitavastatin Calcium; Take 1 tablet (2 mg total) by mouth daily.  Dispense: 90 tablet; Refill: 1  Stage 3b chronic kidney disease (HCC)- Her renal function has declined.  Will try to get better control of her blood pressure.     Follow-up: Return in about 3 months (around 04/06/2023).  Sanda Linger, MD

## 2023-01-05 NOTE — Patient Instructions (Signed)
Hypertension, Adult High blood pressure (hypertension) is when the force of blood pumping through the arteries is too strong. The arteries are the blood vessels that carry blood from the heart throughout the body. Hypertension forces the heart to work harder to pump blood and may cause arteries to become narrow or stiff. Untreated or uncontrolled hypertension can lead to a heart attack, heart failure, a stroke, kidney disease, and other problems. A blood pressure reading consists of a higher number over a lower number. Ideally, your blood pressure should be below 120/80. The first ("top") number is called the systolic pressure. It is a measure of the pressure in your arteries as your heart beats. The second ("bottom") number is called the diastolic pressure. It is a measure of the pressure in your arteries as the heart relaxes. What are the causes? The exact cause of this condition is not known. There are some conditions that result in high blood pressure. What increases the risk? Certain factors may make you more likely to develop high blood pressure. Some of these risk factors are under your control, including: Smoking. Not getting enough exercise or physical activity. Being overweight. Having too much fat, sugar, calories, or salt (sodium) in your diet. Drinking too much alcohol. Other risk factors include: Having a personal history of heart disease, diabetes, high cholesterol, or kidney disease. Stress. Having a family history of high blood pressure and high cholesterol. Having obstructive sleep apnea. Age. The risk increases with age. What are the signs or symptoms? High blood pressure may not cause symptoms. Very high blood pressure (hypertensive crisis) may cause: Headache. Fast or irregular heartbeats (palpitations). Shortness of breath. Nosebleed. Nausea and vomiting. Vision changes. Severe chest pain, dizziness, and seizures. How is this diagnosed? This condition is diagnosed by  measuring your blood pressure while you are seated, with your arm resting on a flat surface, your legs uncrossed, and your feet flat on the floor. The cuff of the blood pressure monitor will be placed directly against the skin of your upper arm at the level of your heart. Blood pressure should be measured at least twice using the same arm. Certain conditions can cause a difference in blood pressure between your right and left arms. If you have a high blood pressure reading during one visit or you have normal blood pressure with other risk factors, you may be asked to: Return on a different day to have your blood pressure checked again. Monitor your blood pressure at home for 1 week or longer. If you are diagnosed with hypertension, you may have other blood or imaging tests to help your health care provider understand your overall risk for other conditions. How is this treated? This condition is treated by making healthy lifestyle changes, such as eating healthy foods, exercising more, and reducing your alcohol intake. You may be referred for counseling on a healthy diet and physical activity. Your health care provider may prescribe medicine if lifestyle changes are not enough to get your blood pressure under control and if: Your systolic blood pressure is above 130. Your diastolic blood pressure is above 80. Your personal target blood pressure may vary depending on your medical conditions, your age, and other factors. Follow these instructions at home: Eating and drinking  Eat a diet that is high in fiber and potassium, and low in sodium, added sugar, and fat. An example of this eating plan is called the DASH diet. DASH stands for Dietary Approaches to Stop Hypertension. To eat this way: Eat   plenty of fresh fruits and vegetables. Try to fill one half of your plate at each meal with fruits and vegetables. Eat whole grains, such as whole-wheat pasta, brown rice, or whole-grain bread. Fill about one  fourth of your plate with whole grains. Eat or drink low-fat dairy products, such as skim milk or low-fat yogurt. Avoid fatty cuts of meat, processed or cured meats, and poultry with skin. Fill about one fourth of your plate with lean proteins, such as fish, chicken without skin, beans, eggs, or tofu. Avoid pre-made and processed foods. These tend to be higher in sodium, added sugar, and fat. Reduce your daily sodium intake. Many people with hypertension should eat less than 1,500 mg of sodium a day. Do not drink alcohol if: Your health care provider tells you not to drink. You are pregnant, may be pregnant, or are planning to become pregnant. If you drink alcohol: Limit how much you have to: 0-1 drink a day for women. 0-2 drinks a day for men. Know how much alcohol is in your drink. In the [place] one drink equals one 12 oz bottle of beer (355 mL), one 5 oz glass of wine (148 mL), or one 1 oz glass of hard liquor (44 mL). Lifestyle  Work with your health care provider to maintain a healthy body weight or to lose weight. Ask what an ideal weight is for you. Get at least 30 minutes of exercise that causes your heart to beat faster (aerobic exercise) most days of the week. Activities may include walking, swimming, or biking. Include exercise to strengthen your muscles (resistance exercise), such as Pilates or lifting weights, as part of your weekly exercise routine. Try to do these types of exercises for 30 minutes at least 3 days a week. Do not use any products that contain nicotine or tobacco. These products include cigarettes, chewing tobacco, and vaping devices, such as e-cigarettes. If you need help quitting, ask your health care provider. Monitor your blood pressure at home as told by your health care provider. Keep all follow-up visits. This is important. Medicines Take over-the-counter and prescription medicines only as told by your health care provider. Follow directions carefully. Blood  pressure medicines must be taken as prescribed. Do not skip doses of blood pressure medicine. Doing this puts you at risk for problems and can make the medicine less effective. Ask your health care provider about side effects or reactions to medicines that you should watch for. Contact a health care provider if you: Think you are having a reaction to a medicine you are taking. Have headaches that keep coming back (recurring). Feel dizzy. Have swelling in your ankles. Have trouble with your vision. Get help right away if you: Develop a severe headache or confusion. Have unusual weakness or numbness. Feel faint. Have severe pain in your chest or abdomen. Vomit repeatedly. Have trouble breathing. These symptoms may be an emergency. Get help right away. Call 911. Do not wait to see if the symptoms will go away. Do not drive yourself to the hospital. Summary Hypertension is when the force of blood pumping through your arteries is too strong. If this condition is not controlled, it may put you at risk for serious complications. Your personal target blood pressure may vary depending on your medical conditions, your age, and other factors. For most people, a normal blood pressure is less than 120/80. Hypertension is treated with lifestyle changes, medicines, or a combination of both. Lifestyle changes include losing weight, eating a healthy,   low-sodium diet, exercising more, and limiting alcohol. This information is not intended to replace advice given to you by your health care provider. Make sure you discuss any questions you have with your health care provider. Document Revised: 07/08/2021 Document Reviewed: 07/08/2021 Elsevier Patient Education  2023 [employer]  

## 2023-01-05 NOTE — Telephone Encounter (Signed)
MD denied refill he states ." Patient needs an appointment " pt has appt today at 11:20../lmb

## 2023-01-05 NOTE — Telephone Encounter (Signed)
Patient scheduled her follow up appointment for 04/07/23, she would like lab orders put in so she can do her labs before her follow up appointment.

## 2023-01-08 ENCOUNTER — Telehealth: Payer: Self-pay | Admitting: Internal Medicine

## 2023-01-08 NOTE — Telephone Encounter (Signed)
Pt called wanting alternative medication for synthroid because her insurance will not cover it and it's too expensive out of pocket.

## 2023-01-09 ENCOUNTER — Other Ambulatory Visit: Payer: Self-pay | Admitting: Internal Medicine

## 2023-01-09 DIAGNOSIS — E039 Hypothyroidism, unspecified: Secondary | ICD-10-CM

## 2023-01-09 MED ORDER — LEVOTHYROXINE SODIUM 137 MCG PO TABS: 137.0000 ug | ORAL_TABLET | Freq: Every day | ORAL | 0 refills | Status: DC

## 2023-01-21 ENCOUNTER — Telehealth: Payer: Self-pay

## 2023-01-21 ENCOUNTER — Encounter: Payer: Self-pay | Admitting: Internal Medicine

## 2023-01-21 NOTE — Telephone Encounter (Signed)
PA initiated via Covermymeds; KEY: [id number]. Awaiting determination.

## 2023-01-22 ENCOUNTER — Ambulatory Visit (INDEPENDENT_AMBULATORY_CARE_PROVIDER_SITE_OTHER): Payer: Medicare Other | Admitting: Family Medicine

## 2023-01-22 ENCOUNTER — Encounter: Payer: Self-pay | Admitting: Family Medicine

## 2023-01-22 VITALS — BP 132/88 | HR 63 | Temp 97.6°F | Ht 65.0 in

## 2023-01-22 DIAGNOSIS — M25561 Pain in right knee: Secondary | ICD-10-CM | POA: Diagnosis not present

## 2023-01-22 NOTE — Patient Instructions (Signed)
Continue with conservative treatment including elevation and Tylenol.  You may consider using an ice pack and over-the-counter Voltaren gel if needed.  Follow-up if worsening or as needed.

## 2023-01-22 NOTE — Telephone Encounter (Signed)
PA denied.   The drug you asked for is not listed in your preferred drug list (formulary). The preferred drug(s), you may not have tried are: Zypitamag 2 mg tablet, Zypitamag 4 mg tablet, Nexlizet tablet, Nexletol tablet AND ezetimibe tablet. Your provider needs to give [place] medical reasons why the preferred drug(s) would not work for you and/or would have bad side effects. Sometimes a preferred drug needs more review for approval. Additionally, some preferred drugs listed may be the same drugs with different strengths or forms. [employer] may only require one strength or form of that drug to be tried. This decision was from [employer] Non-Formulary Exceptions Coverage Policy

## 2023-01-22 NOTE — Progress Notes (Signed)
Subjective:     Patient ID: Valerie Jackson, female    DOB: Feb 15, 1946, 77 y.o.   MRN: [id number]  Chief Complaint  Patient presents with   Knee Pain    Right knee pain, started Monday when she went to get up and joint didn't seem to come along when she turned. Really bad pain for about the first 3 days and starting to let up now. Took pain meds today tylenol with codeine.     Knee Pain    Patient is in today for a 3 to 4-day history of right knee pain after twisting her knee when standing up.  Denies falling.  Denies history of knee pain or surgery.  No swelling. Reports pain is 75% improved today.  She is elevating it and taking Tylenol with codeine.  Health Maintenance Due  Topic Date Due   Zoster Vaccines- Shingrix (1 of 2) Never done   Lung Cancer Screening  01/17/2020   DTaP/Tdap/Td (2 - Tdap) 09/14/2021    Past Medical History:  Diagnosis Date   Arthritis    fingers   Blood clot in vein 1968   Cataracts, bilateral    CHF (congestive heart failure) (HCC)    Chronic kidney disease    CKD stage 2   COPD (chronic obstructive pulmonary disease) (HCC)    Depression    Diverticulitis 09/2013   with perforation   Dizziness    with decreased BP readings or bending over   Floaters    GERD (gastroesophageal reflux disease)    H/O hiatal hernia    Headache(784.0)    migraine every 5- to 6 years   Heart murmur since age 9   mild   Hepatitis age 71 or 9   had heapatitis, not sure which kind   History of blood transfusion 1965   History of frequent urinary tract infections    Hypercholesteremia    Hypertension    Hypothyroid    Obesity    Pericolonic abscess 2015   Pneumonia    Sickle cell trait (HCC)     Past Surgical History:  Procedure Laterality Date   CHOLECYSTECTOMY  2002   COLON RESECTION N/A 10/11/2013   Procedure: EXPLORATORY LAPAROTOMY WITH RECTOSIGMOID COLECTOMY AND END COLOSTOMY;  Surgeon: Almond Lint, MD;  Location: [facility];  Service: General;   Laterality: N/A;   COLOSTOMY TAKEDOWN N/A 02/27/2014   Procedure: COLOSTOMY TAKEDOWN [place] HERNIA REPAIR;  Surgeon: Almond Lint, MD;  Location: [facility];  Service: General;  Laterality: N/A;   INCISIONAL HERNIA REPAIR N/A 03/04/2016   Procedure: LAPAROSCOPIC INCISIONAL HERNIA WITH MESH;  Surgeon: Axel Filler, MD;  Location: [facility];  Service: General;  Laterality: N/A;   LAPAROSCOPIC LYSIS OF ADHESIONS N/A 03/04/2016   Procedure: LAPAROSCOPIC LYSIS OF ADHESIONS;  Surgeon: Axel Filler, MD;  Location: [facility];  Service: General;  Laterality: N/A;   RIGHT HEART CATH N/A 06/24/2017   Procedure: RIGHT HEART CATH;  Surgeon: Dolores Patty, MD;  Location: [facility];  Service: Cardiovascular;  Laterality: N/A;   TONSILLECTOMY  1952    Family History  Problem Relation Age of Onset   Hypertension Mother    Aneurysm Mother        Cerebral (died from this)   Cancer Mother        Uterine   Cancer Other    Arthritis Sister    Hyperlipidemia Sister    Hypertension Sister    Stroke Sister    Kidney disease  Sister        Cancer   Hyperlipidemia Brother    Glaucoma Brother    Thyroid cancer Daughter    Breast cancer Maternal Grandmother 86   Hypertension Maternal Grandmother    Hypercalcemia Neg Hx     Social History   Socioeconomic History   Marital status: Widowed    Spouse name: Not on file   Number of children: 1   Years of education: 14   Highest education level: Associate degree: occupational, Scientist, product/process development, or vocational program  Occupational History   Occupation: Retired    Comment: Charity fundraiser  Tobacco Use   Smoking status: Former    Packs/day: 0.50    Years: 52.00    Additional pack years: 0.00    Total pack years: 26.00    Types: Cigarettes    Quit date: 09/06/2012    Years since quitting: 10.3   Smokeless tobacco: Never   Tobacco comments:    never smoked a full PPD.   Vaping Use   Vaping Use: Never used  Substance and Sexual Activity   Alcohol use: No     Alcohol/week: 0.0 standard drinks of alcohol    Comment: very rarely   Drug use: No   Sexual activity: Not Currently  Other Topics Concern   Not on file  Social History Narrative   Regular exercise-no   Caffeine Use-yes   Social Determinants of Health   Financial Resource Strain: Low Risk  (01/21/2023)   Overall Financial Resource Strain (CARDIA)    Difficulty of Paying Living Expenses: Not hard at all  Food Insecurity: No Food Insecurity (01/21/2023)   Hunger Vital Sign    Worried About Running Out of Food in the Last Year: Never true    Ran Out of Food in the Last Year: Never true  Transportation Needs: No Transportation Needs (01/21/2023)   PRAPARE - Administrator, Civil Service (Medical): No    Lack of Transportation (Non-Medical): No  Physical Activity: Unknown (01/21/2023)   Exercise Vital Sign    Days of Exercise per Week: 0 days    Minutes of Exercise per Session: Not on file  Stress: Stress Concern Present (01/21/2023)   [employer] of Occupational Health - Occupational Stress Questionnaire    Feeling of Stress : Rather much  Social Connections: Unknown (01/21/2023)   Social Connection and Isolation Panel [NHANES]    Frequency of Communication with Friends and Family: Patient declined    Frequency of Social Gatherings with Friends and Family: Patient declined    Attends Religious Services: Patient declined    Database administrator or Organizations: No    Attends Engineer, structural: Not on file    Marital Status: Patient declined  Catering manager Violence: Not on file    Outpatient Medications Prior to Visit  Medication Sig Dispense Refill   acetaminophen (TYLENOL) 500 MG tablet Take 1 tablet (500 mg total) by mouth every 6 (six) hours as needed. 30 tablet 0   allopurinol (ZYLOPRIM) 100 MG tablet Take 1 tablet (100 mg total) by mouth daily. 90 tablet 1   ALPRAZolam (XANAX) 0.5 MG tablet TAKE ONE TABLET BY MOUTH AT BEDTIME AS NEEDED FOR  ANXIETY 30 tablet 2   amLODipine (NORVASC) 5 MG tablet Take 1 tablet (5 mg total) by mouth daily. 90 tablet 1   aspirin EC 81 MG tablet Take 81 mg by mouth daily. Swallow whole.     cholecalciferol (VITAMIN D) 1000 UNITS tablet Take  2,000 Units by mouth daily.     colchicine 0.6 MG tablet Take 1 tablet (0.6 mg total) by mouth daily. Take 1.2 mg (2 tablets) once and then take third tablet one hour later 3 tablet 0   colestipol (COLESTID) 1 g tablet Take 1 tablet (1 g total) by mouth 2 (two) times daily. 60 tablet 11   cyanocobalamin (VITAMIN B12) 1000 MCG/ML injection Inject 1 mL (1,000 mcg total) into the muscle every 30 (thirty) days. 4 mL 3   folic acid (FOLVITE) 1 MG tablet Take 1 tablet (1 mg total) by mouth daily. 90 tablet 0   hydrALAZINE (APRESOLINE) 25 MG tablet Take 1 tablet (25 mg total) by mouth 3 (three) times daily. 270 tablet 0   indapamide (LOZOL) 1.25 MG tablet Take 1.25 mg by mouth daily.     levothyroxine (SYNTHROID) 137 MCG tablet Take 1 tablet (137 mcg total) by mouth daily before breakfast. 90 tablet 0   nebivolol (BYSTOLIC) 5 MG tablet Take 1 tablet (5 mg total) by mouth daily. 90 tablet 0   Pitavastatin Calcium 2 MG TABS Take 1 tablet (2 mg total) by mouth daily. 90 tablet 1   potassium chloride SA (KLOR-CON M15) 15 MEQ tablet Take 1 tablet (15 mEq total) by mouth 3 (three) times daily. 270 tablet 0   rizatriptan (MAXALT-MLT) 10 MG disintegrating tablet Take 1 tablet (10 mg total) by mouth as needed for migraine. May repeat in 2 hours if needed 30 tablet 1   SYRINGE-NEEDLE, DISP, 3 ML 25G X 5/8" 3 ML MISC 1 each by Does not apply route every 30 (thirty) days. 50 each 0   No facility-administered medications prior to visit.    Allergies  Allergen Reactions   Crestor [Rosuvastatin] Other (See Comments)    Myalgias    Estrogens     Blood clot in leg    Review of Systems  Constitutional:  Negative for chills and fever.  Respiratory:  Negative for shortness of breath.    Cardiovascular:  Negative for chest pain, palpitations and leg swelling.  Gastrointestinal:  Negative for nausea and vomiting.  Musculoskeletal:  Positive for joint pain. Negative for falls.  Neurological:  Negative for dizziness.       Objective:    Physical Exam Constitutional:      General: She is not in acute distress.    Appearance: She is not ill-appearing.  Eyes:     Extraocular Movements: Extraocular movements intact.     Conjunctiva/sclera: Conjunctivae normal.  Cardiovascular:     Rate and Rhythm: Normal rate.  Pulmonary:     Effort: Pulmonary effort is normal.  Musculoskeletal:     Cervical back: Normal range of motion.     Right knee: No swelling, effusion or bony tenderness. Normal range of motion. No tenderness. Normal patellar mobility. Normal pulse.     Instability Tests: Medial McMurray test negative and lateral McMurray test negative.     Right lower leg: No edema.     Left lower leg: No edema.  Skin:    General: Skin is warm and dry.     Findings: No bruising, erythema or rash.  Neurological:     General: No focal deficit present.     Mental Status: She is alert and oriented to person, place, and time.     Sensory: No sensory deficit.     Motor: No weakness.     Coordination: Coordination normal.     Gait: Gait abnormal.  Psychiatric:  Mood and Affect: Mood normal.        Behavior: Behavior normal.        Thought Content: Thought content normal.     BP 132/88 (BP Location: Left Arm, Patient Position: Sitting, Cuff Size: Large)   Pulse 63   Temp 97.6 F (36.4 C) (Temporal)   Ht 5\' 5"  (1.651 m)   SpO2 97%   BMI 33.32 kg/m  Wt Readings from Last 3 Encounters:  01/05/23 200 lb 3.2 oz (90.8 kg)  08/13/22 195 lb (88.5 kg)  01/12/22 196 lb (88.9 kg)       Assessment & Plan:   Problem List Items Addressed This Visit   None Visit Diagnoses     Acute pain of right knee    -  Primary      Discussed possible mild sprain.  Reports 75%  improvement since onset.  No fall.  Continue conservative treatment including ice, elevation, topical pain medicine and Tylenol as needed.  No need for imaging.   I am having [place] maintain her cholecalciferol, acetaminophen, allopurinol, colestipol, potassium chloride SA, SYRINGE-NEEDLE (DISP) 3 ML, indapamide, aspirin EC, ALPRAZolam, rizatriptan, cyanocobalamin, colchicine, folic acid, nebivolol, amLODipine, hydrALAZINE, Pitavastatin Calcium, and levothyroxine.  No orders of the defined types were placed in this encounter.

## 2023-01-26 ENCOUNTER — Telehealth (HOSPITAL_BASED_OUTPATIENT_CLINIC_OR_DEPARTMENT_OTHER): Payer: Self-pay

## 2023-03-02 ENCOUNTER — Other Ambulatory Visit (HOSPITAL_COMMUNITY): Payer: Self-pay

## 2023-03-08 ENCOUNTER — Other Ambulatory Visit: Payer: Self-pay | Admitting: Internal Medicine

## 2023-03-08 DIAGNOSIS — Z1231 Encounter for screening mammogram for malignant neoplasm of breast: Secondary | ICD-10-CM

## 2023-04-04 ENCOUNTER — Other Ambulatory Visit: Payer: Self-pay | Admitting: Internal Medicine

## 2023-04-04 ENCOUNTER — Ambulatory Visit
Admission: EM | Admit: 2023-04-04 | Discharge: 2023-04-04 | Disposition: A | Discharge status: Home / Self Care | Payer: Medicare Other | Attending: Emergency Medicine | Admitting: Emergency Medicine

## 2023-04-04 DIAGNOSIS — E538 Deficiency of other specified B group vitamins: Secondary | ICD-10-CM

## 2023-04-04 DIAGNOSIS — M109 Gout, unspecified: Secondary | ICD-10-CM

## 2023-04-04 DIAGNOSIS — I10 Essential (primary) hypertension: Secondary | ICD-10-CM

## 2023-04-04 MED ORDER — COLCHICINE 0.6 MG PO TABS: ORAL_TABLET | ORAL | 0 refills | Status: DC

## 2023-04-04 MED ORDER — METHYLPREDNISOLONE SODIUM SUCC 125 MG IJ SOLR: 60.0000 mg | Freq: Once | INTRAMUSCULAR | Status: DC

## 2023-04-04 MED ORDER — METHYLPREDNISOLONE ACETATE 40 MG/ML IJ SUSP
40.0000 mg | Freq: Once | INTRAMUSCULAR | Status: AC
  Administered 2023-04-04: 40 mg via INTRAMUSCULAR

## 2023-04-04 MED ORDER — PREDNISONE 20 MG PO TABS: 40.0000 mg | ORAL_TABLET | Freq: Every day | ORAL | 0 refills | Status: AC

## 2023-04-04 MED ORDER — METHYLPREDNISOLONE SODIUM SUCC 125 MG IJ SOLR: 40.0000 mg | Freq: Once | INTRAMUSCULAR | Status: DC

## 2023-04-04 NOTE — Discharge Instructions (Addendum)
I have given you a shot of Solu-Medrol here.  I would consider taking the steroids anyway.  You may take at 1000 mg Tylenol 3 times a day.  Take 1.2 mg of colchicine x 1 and then another 0.6 mg 1 hour later.  You can resume your normal dose of colchicine tomorrow.  Follow-up with your primary care provider.  Sometimes renal insufficiency can cause a gout flare.  Go to the ER for fevers above 100.4, worsening symptoms, or for any other concerns

## 2023-04-04 NOTE — ED Provider Notes (Signed)
HPI  SUBJECTIVE:  Valerie Jackson is a 77 y.o. female who presents with left first MTP pain, erythema, swelling starting yesterday.  She reports hypersensitivity, increased temperature and limitation of motion of the toe.  No trauma , fevers, body aches, known insect bite or bruising.  She states this is identical to previous gout flares which she usually gets in her right MTP.  No recent change in her diet.  She is on allopurinol and colchicine 0.6 mg daily.  She has not tried anything else for her symptoms.  Symptoms are worse with movement, palpation, wearing shoes, or having a sheet on it.  She has chronic kidney disease stage III, gout, provisional diagnosis of diabetes, hypertension, PVCs, atrial fibrillation on 81 mg aspirin only, and emphysema.  PCP: [facility].  Patient was seen here in February with gout right foot.  She was given Solu-Medrol 60 mg IM here, sent home with prednisone 40 mg for 5 days and colchicine.  Past Medical History:  Diagnosis Date   Arthritis    fingers   Blood clot in vein 1968   Cataracts, bilateral    CHF (congestive heart failure) (HCC)    Chronic kidney disease    CKD stage 2   COPD (chronic obstructive pulmonary disease) (HCC)    Depression    Diverticulitis 09/2013   with perforation   Dizziness    with decreased BP readings or bending over   Floaters    GERD (gastroesophageal reflux disease)    H/O hiatal hernia    Headache(784.0)    migraine every 5- to 6 years   Heart murmur since age 40   mild   Hepatitis age 76 or 9   had heapatitis, not sure which kind   History of blood transfusion 1965   History of frequent urinary tract infections    Hypercholesteremia    Hypertension    Hypothyroid    Obesity    Pericolonic abscess 2015   Pneumonia    Sickle cell trait (HCC)     Past Surgical History:  Procedure Laterality Date   CHOLECYSTECTOMY  2002   COLON RESECTION N/A 10/11/2013   Procedure: EXPLORATORY LAPAROTOMY WITH  RECTOSIGMOID COLECTOMY AND END COLOSTOMY;  Surgeon: Almond Lint, MD;  Location: [facility];  Service: General;  Laterality: N/A;   COLOSTOMY TAKEDOWN N/A 02/27/2014   Procedure: COLOSTOMY TAKEDOWN [place] HERNIA REPAIR;  Surgeon: Almond Lint, MD;  Location: [facility];  Service: General;  Laterality: N/A;   INCISIONAL HERNIA REPAIR N/A 03/04/2016   Procedure: LAPAROSCOPIC INCISIONAL HERNIA WITH MESH;  Surgeon: Axel Filler, MD;  Location: [facility];  Service: General;  Laterality: N/A;   LAPAROSCOPIC LYSIS OF ADHESIONS N/A 03/04/2016   Procedure: LAPAROSCOPIC LYSIS OF ADHESIONS;  Surgeon: Axel Filler, MD;  Location: [facility];  Service: General;  Laterality: N/A;   RIGHT HEART CATH N/A 06/24/2017   Procedure: RIGHT HEART CATH;  Surgeon: Dolores Patty, MD;  Location: [facility];  Service: Cardiovascular;  Laterality: N/A;   TONSILLECTOMY  1952    Family History  Problem Relation Age of Onset   Hypertension Mother    Aneurysm Mother        Cerebral (died from this)   Cancer Mother        Uterine   Cancer Other    Arthritis Sister    Hyperlipidemia Sister    Hypertension Sister    Stroke Sister    Kidney disease Sister  Cancer   Hyperlipidemia Brother    Glaucoma Brother    Thyroid cancer Daughter    Breast cancer Maternal Grandmother 53   Hypertension Maternal Grandmother    Hypercalcemia Neg Hx     Social History   Tobacco Use   Smoking status: Former    Current packs/day: 0.00    Average packs/day: 0.5 packs/day for 52.0 years (26.0 ttl pk-yrs)    Types: Cigarettes    Start date: 09/06/1960    Quit date: 09/06/2012    Years since quitting: 10.5   Smokeless tobacco: Never   Tobacco comments:    never smoked a full PPD.   Vaping Use   Vaping status: Never Used  Substance Use Topics   Alcohol use: No    Alcohol/week: 0.0 standard drinks of alcohol    Comment: very rarely   Drug use: No    No current facility-administered medications for this  encounter.  Current Outpatient Medications:    colchicine 0.6 MG tablet, 2 tabs po x 1, then one tab po 1 hour later, Disp: 6 tablet, Rfl: 0   predniSONE (DELTASONE) 20 MG tablet, Take 2 tablets (40 mg total) by mouth daily with breakfast for 5 days., Disp: 10 tablet, Rfl: 0   acetaminophen (TYLENOL) 500 MG tablet, Take 1 tablet (500 mg total) by mouth every 6 (six) hours as needed., Disp: 30 tablet, Rfl: 0   allopurinol (ZYLOPRIM) 100 MG tablet, Take 1 tablet (100 mg total) by mouth daily., Disp: 90 tablet, Rfl: 1   ALPRAZolam (XANAX) 0.5 MG tablet, TAKE ONE TABLET BY MOUTH AT BEDTIME AS NEEDED FOR ANXIETY, Disp: 30 tablet, Rfl: 2   amLODipine (NORVASC) 5 MG tablet, Take 1 tablet (5 mg total) by mouth daily., Disp: 90 tablet, Rfl: 1   aspirin EC 81 MG tablet, Take 81 mg by mouth daily. Swallow whole., Disp: , Rfl:    cholecalciferol (VITAMIN D) 1000 UNITS tablet, Take 2,000 Units by mouth daily., Disp: , Rfl:    colestipol (COLESTID) 1 g tablet, Take 1 tablet (1 g total) by mouth 2 (two) times daily., Disp: 60 tablet, Rfl: 11   cyanocobalamin (VITAMIN B12) 1000 MCG/ML injection, Inject 1 mL (1,000 mcg total) into the muscle every 30 (thirty) days., Disp: 4 mL, Rfl: 3   folic acid (FOLVITE) 1 MG tablet, TAKE ONE TABLET BY MOUTH ONCE A DAY, Disp: 90 tablet, Rfl: 0   hydrALAZINE (APRESOLINE) 25 MG tablet, TAKE ONE TABLET BY MOUTH THREE TIMES DAILY, Disp: 270 tablet, Rfl: 0   indapamide (LOZOL) 1.25 MG tablet, Take 1.25 mg by mouth daily., Disp: , Rfl:    levothyroxine (SYNTHROID) 137 MCG tablet, TAKE ONE TABLET BY MOUTH ONCE A DAY BEFORE BREAKFAST, Disp: 90 tablet, Rfl: 0   nebivolol (BYSTOLIC) 5 MG tablet, Take 1 tablet (5 mg total) by mouth daily., Disp: 90 tablet, Rfl: 0   Pitavastatin Calcium 2 MG TABS, Take 1 tablet (2 mg total) by mouth daily., Disp: 90 tablet, Rfl: 1   potassium chloride SA (KLOR-CON M15) 15 MEQ tablet, Take 1 tablet (15 mEq total) by mouth 3 (three) times daily., Disp: 270  tablet, Rfl: 0   rizatriptan (MAXALT-MLT) 10 MG disintegrating tablet, Take 1 tablet (10 mg total) by mouth as needed for migraine. May repeat in 2 hours if needed, Disp: 30 tablet, Rfl: 1   SYRINGE-NEEDLE, DISP, 3 ML 25G X 5/8" 3 ML MISC, 1 each by Does not apply route every 30 (thirty) days., Disp: 50 each,  Rfl: 0  Allergies  Allergen Reactions   Crestor [Rosuvastatin] Other (See Comments)    Myalgias    Estrogens     Blood clot in leg     ROS  As noted in HPI.   Physical Exam  BP 132/80 (BP Location: Right Arm)   Pulse 64   Temp 97.9 F (36.6 C) (Oral)   Resp 20   SpO2 94%   Constitutional: Well developed, well nourished, no acute distress Eyes:  EOMI, conjunctiva normal bilaterally HENT: Normocephalic, atraumatic,mucus membranes moist Respiratory: Normal inspiratory effort Cardiovascular: Normal rate GI: nondistended skin: No rash, skin intact Musculoskeletal: Tender erythema, edema left first MTP.  Limited range of motion of the first toe.  Patient will not let me touch the MTP unassisted.  No tenderness over the rest of the foot.  DP 2+.  Neurologic: Alert & oriented x 3, no focal neuro deficits Psychiatric: Speech and behavior appropriate   ED Course   Medications  methylPREDNISolone acetate (DEPO-MEDROL) injection 40 mg (40 mg Intramuscular Given 04/04/23 1127)    No orders of the defined types were placed in this encounter.   No results found for this or any previous visit (from the past 24 hour(s)). No results found.  ED Clinical Impression  1. Acute gout involving toe of left foot, unspecified cause      ED Assessment/Plan     Previous records reviewed.  As noted in HPI.  Presentation consistent with gout.  Doubt septic joint, fracture.  Deferring imaging today in the absence of trauma.  Patient states that she does not really want to do oral steroids, will give a dose of 40 mg of Solu-Medrol per up-to-date recommendations but also send her  home with 5 days of steroids for her to consider taking.  We do not have 60 mg of Solu-Medrol available here.  Tylenol 3 times daily.  Increase colchicine to 1.2 mg x 1 with an additional dose 1 hour later.  Continue current colchicine dose tomorrow.  Gave patient low purine eating plan.  Follow-up with PCP for BUN/creatinine check.  Discussed with her that renal insufficiency can cause gout flares.  ER return precautions given.   Discussed MDM, treatment plan, and plan for follow-up with patient. Discussed sn/sx that should prompt return to the ED. patient agrees with plan.   Meds ordered this encounter  Medications   DISCONTD: methylPREDNISolone sodium succinate (SOLU-MEDROL) 125 mg/2 mL injection 60 mg   colchicine 0.6 MG tablet    Sig: 2 tabs po x 1, then one tab po 1 hour later    Dispense:  6 tablet    Refill:  0   predniSONE (DELTASONE) 20 MG tablet    Sig: Take 2 tablets (40 mg total) by mouth daily with breakfast for 5 days.    Dispense:  10 tablet    Refill:  0   DISCONTD: methylPREDNISolone sodium succinate (SOLU-MEDROL) 125 mg/2 mL injection 40 mg   methylPREDNISolone acetate (DEPO-MEDROL) injection 40 mg      *This clinic note was created using Scientist, clinical (histocompatibility and immunogenetics). Therefore, there may be occasional mistakes despite careful proofreading.  ?    Domenick Gong, MD 04/05/23 1101

## 2023-04-04 NOTE — ED Triage Notes (Signed)
Pt c/o gout to left foot x 3 days-NAD-slow gait

## 2023-04-05 ENCOUNTER — Other Ambulatory Visit: Payer: Self-pay | Admitting: Internal Medicine

## 2023-04-05 DIAGNOSIS — E039 Hypothyroidism, unspecified: Secondary | ICD-10-CM

## 2023-04-07 ENCOUNTER — Ambulatory Visit: Payer: Medicare Other | Admitting: Internal Medicine

## 2023-04-07 ENCOUNTER — Encounter: Payer: Self-pay | Admitting: Internal Medicine

## 2023-04-07 ENCOUNTER — Ambulatory Visit (INDEPENDENT_AMBULATORY_CARE_PROVIDER_SITE_OTHER): Payer: Medicare Other

## 2023-04-07 VITALS — BP 124/78 | HR 66 | Temp 97.8°F | Ht 65.0 in | Wt 195.4 lb

## 2023-04-07 VITALS — BP 124/78 | HR 66 | Temp 97.8°F | Resp 16 | Ht 65.0 in | Wt 195.4 lb

## 2023-04-07 DIAGNOSIS — I700 Atherosclerosis of aorta: Secondary | ICD-10-CM | POA: Diagnosis not present

## 2023-04-07 DIAGNOSIS — Z0000 Encounter for general adult medical examination without abnormal findings: Secondary | ICD-10-CM

## 2023-04-07 DIAGNOSIS — I10 Essential (primary) hypertension: Secondary | ICD-10-CM

## 2023-04-07 DIAGNOSIS — N182 Chronic kidney disease, stage 2 (mild): Secondary | ICD-10-CM | POA: Diagnosis not present

## 2023-04-07 DIAGNOSIS — E039 Hypothyroidism, unspecified: Secondary | ICD-10-CM | POA: Diagnosis not present

## 2023-04-07 DIAGNOSIS — M10372 Gout due to renal impairment, left ankle and foot: Secondary | ICD-10-CM

## 2023-04-07 MED ORDER — OXYCODONE-ACETAMINOPHEN 7.5-325 MG PO TABS: 1.0000 | ORAL_TABLET | Freq: Three times a day (TID) | ORAL | 0 refills | Status: AC | PRN

## 2023-04-07 NOTE — Patient Instructions (Signed)
Valerie Jackson , Thank you for taking time to come for your Medicare Wellness Visit. I appreciate your ongoing commitment to your health goals. Please review the following plan we discussed and let me know if I can assist you in the future.   These are the goals we discussed:  Goals      My goal for 2024 is to make it to 2025.        This is a list of the screening recommended for you and due dates:  Health Maintenance  Topic Date Due   Zoster (Shingles) Vaccine (1 of 2) Never done   Screening for Lung Cancer  01/17/2020   DTaP/Tdap/Td vaccine (2 - Tdap) 09/14/2021   COVID-19 Vaccine (4 - 2023-24 season) 05/15/2022   Flu Shot  04/15/2023   Colon Cancer Screening  03/14/2024   Medicare Annual Wellness Visit  04/06/2024   Pneumonia Vaccine  Completed   DEXA scan (bone density measurement)  Completed   Hepatitis C Screening  Completed   HPV Vaccine  Aged Out    Advanced directives: No  Conditions/risks identified: Yes  Next appointment: Follow up in one year for your annual wellness visit by calling [phone] to schedule with Nurse.   Preventive Care 35 Years and Older, Female Preventive care refers to lifestyle choices and visits with your health care provider that can promote health and wellness. What does preventive care include? A yearly physical exam. This is also called an annual well check. Dental exams once or twice a year. Routine eye exams. Ask your health care provider how often you should have your eyes checked. Personal lifestyle choices, including: Daily care of your teeth and gums. Regular physical activity. Eating a healthy diet. Avoiding tobacco and drug use. Limiting alcohol use. Practicing safe sex. Taking low-dose aspirin every day. Taking vitamin and mineral supplements as recommended by your health care provider. What happens during an annual well check? The services and screenings done by your health care provider during your annual well check will  depend on your age, overall health, lifestyle risk factors, and family history of disease. Counseling  Your health care provider may ask you questions about your: Alcohol use. Tobacco use. Drug use. Emotional well-being. Home and relationship well-being. Sexual activity. Eating habits. History of falls. Memory and ability to understand (cognition). Work and work Astronomer. Reproductive health. Screening  You may have the following tests or measurements: Height, weight, and BMI. Blood pressure. Lipid and cholesterol levels. These may be checked every 5 years, or more frequently if you are over 42 years old. Skin check. Lung cancer screening. You may have this screening every year starting at age 48 if you have a 30-pack-year history of smoking and currently smoke or have quit within the past 15 years. Fecal occult blood test (FOBT) of the stool. You may have this test every year starting at age 89. Flexible sigmoidoscopy or colonoscopy. You may have a sigmoidoscopy every 5 years or a colonoscopy every 10 years starting at age 34. Hepatitis C blood test. Hepatitis B blood test. Sexually transmitted disease (STD) testing. Diabetes screening. This is done by checking your blood sugar (glucose) after you have not eaten for a while (fasting). You may have this done every 1-3 years. Bone density scan. This is done to screen for osteoporosis. You may have this done starting at age 106. Mammogram. This may be done every 1-2 years. Talk to your health care provider about how often you should have regular mammograms.  Talk with your health care provider about your test results, treatment options, and if necessary, the need for more tests. Vaccines  Your health care provider may recommend certain vaccines, such as: Influenza vaccine. This is recommended every year. Tetanus, diphtheria, and acellular pertussis (Tdap, Td) vaccine. You may need a Td booster every 10 years. Zoster vaccine. You may  need this after age 66. Pneumococcal 13-valent conjugate (PCV13) vaccine. One dose is recommended after age 27. Pneumococcal polysaccharide (PPSV23) vaccine. One dose is recommended after age 56. Talk to your health care provider about which screenings and vaccines you need and how often you need them. This information is not intended to replace advice given to you by your health care provider. Make sure you discuss any questions you have with your health care provider. Document Released: 09/27/2015 Document Revised: 05/20/2016 Document Reviewed: 07/02/2015 Elsevier Interactive Patient Education  2017 [employer].  Fall Prevention in the Home Falls can cause injuries. They can happen to people of all ages. There are many things you can do to make your home safe and to help prevent falls. What can I do on the outside of my home? Regularly fix the edges of walkways and driveways and fix any cracks. Remove anything that might make you trip as you walk through a door, such as a raised step or threshold. Trim any bushes or trees on the path to your home. Use bright outdoor lighting. Clear any walking paths of anything that might make someone trip, such as rocks or tools. Regularly check to see if handrails are loose or broken. Make sure that both sides of any steps have handrails. Any raised decks and porches should have guardrails on the edges. Have any leaves, snow, or ice cleared regularly. Use sand or salt on walking paths during winter. Clean up any spills in your garage right away. This includes oil or grease spills. What can I do in the bathroom? Use night lights. Install grab bars by the toilet and in the tub and shower. Do not use towel bars as grab bars. Use non-skid mats or decals in the tub or shower. If you need to sit down in the shower, use a plastic, non-slip stool. Keep the floor dry. Clean up any water that spills on the floor as soon as it happens. Remove soap buildup in  the tub or shower regularly. Attach bath mats securely with double-sided non-slip rug tape. Do not have throw rugs and other things on the floor that can make you trip. What can I do in the bedroom? Use night lights. Make sure that you have a light by your bed that is easy to reach. Do not use any sheets or blankets that are too big for your bed. They should not hang down onto the floor. Have a firm chair that has side arms. You can use this for support while you get dressed. Do not have throw rugs and other things on the floor that can make you trip. What can I do in the kitchen? Clean up any spills right away. Avoid walking on wet floors. Keep items that you use a lot in easy-to-reach places. If you need to reach something above you, use a strong step stool that has a grab bar. Keep electrical cords out of the way. Do not use floor polish or wax that makes floors slippery. If you must use wax, use non-skid floor wax. Do not have throw rugs and other things on the floor that can make  you trip. What can I do with my stairs? Do not leave any items on the stairs. Make sure that there are handrails on both sides of the stairs and use them. Fix handrails that are broken or loose. Make sure that handrails are as long as the stairways. Check any carpeting to make sure that it is firmly attached to the stairs. Fix any carpet that is loose or worn. Avoid having throw rugs at the top or bottom of the stairs. If you do have throw rugs, attach them to the floor with carpet tape. Make sure that you have a light switch at the top of the stairs and the bottom of the stairs. If you do not have them, ask someone to add them for you. What else can I do to help prevent falls? Wear shoes that: Do not have high heels. Have rubber bottoms. Are comfortable and fit you well. Are closed at the toe. Do not wear sandals. If you use a stepladder: Make sure that it is fully opened. Do not climb a closed  stepladder. Make sure that both sides of the stepladder are locked into place. Ask someone to hold it for you, if possible. Clearly mark and make sure that you can see: Any grab bars or handrails. First and last steps. Where the edge of each step is. Use tools that help you move around (mobility aids) if they are needed. These include: Canes. Walkers. Scooters. Crutches. Turn on the lights when you go into a dark area. Replace any light bulbs as soon as they burn out. Set up your furniture so you have a clear path. Avoid moving your furniture around. If any of your floors are uneven, fix them. If there are any pets around you, be aware of where they are. Review your medicines with your doctor. Some medicines can make you feel dizzy. This can increase your chance of falling. Ask your doctor what other things that you can do to help prevent falls. This information is not intended to replace advice given to you by your health care provider. Make sure you discuss any questions you have with your health care provider. Document Released: 06/27/2009 Document Revised: 02/06/2016 Document Reviewed: 10/05/2014 Elsevier Interactive Patient Education  2017 [employer].

## 2023-04-07 NOTE — Patient Instructions (Signed)
Gout  Gout is a condition that causes painful swelling of the joints. Gout is a type of inflammation of the joints (arthritis). This condition is caused by having too much uric acid in the body. Uric acid is a chemical that forms when the body breaks down substances called purines. Purines are important for building body proteins. When the body has too much uric acid, sharp crystals can form and build up inside the joints. This causes pain and swelling. Gout attacks can happen quickly and may be very painful (acute gout). Over time, the attacks can affect more joints and become more frequent (chronic gout). Gout can also cause uric acid to build up under the skin and inside the kidneys. What are the causes? This condition is caused by too much uric acid in your blood. This can happen because: Your kidneys do not remove enough uric acid from your blood. This is the most common cause. Your body makes too much uric acid. This can happen with some cancers and cancer treatments. It can also occur if your body is breaking down too many red blood cells (hemolytic anemia). You eat too many foods that are high in purines. These foods include organ meats and some seafood. Alcohol, especially beer, is also high in purines. A gout attack may be triggered by trauma or stress. What increases the risk? The following factors may make you more likely to develop this condition: Having a family history of gout. Being female and middle-aged. Being female and having gone through menopause. Taking certain medicines, including aspirin, cyclosporine, diuretics, levodopa, and niacin. Having an organ transplant. Having certain conditions, such as: Being obese. Lead poisoning. Kidney disease. A skin condition called psoriasis. Other factors include: Losing weight too quickly. Being dehydrated. Frequently drinking alcohol, especially beer. Frequently drinking beverages that are sweetened with a type of sugar called  fructose. What are the signs or symptoms? An attack of acute gout happens quickly. It usually occurs in just one joint. The most common place is the big toe. Attacks often start at night. Other joints that may be affected include joints of the feet, ankle, knee, fingers, wrist, or elbow. Symptoms of this condition may include: Severe pain. Warmth. Swelling. Stiffness. Tenderness. The affected joint may be very painful to touch. Shiny, red, or purple skin. Chills and fever. Chronic gout may cause symptoms more frequently. More joints may be involved. You may also have white or yellow lumps (tophi) on your hands or feet or in other areas near your joints. How is this diagnosed? This condition is diagnosed based on your symptoms, your medical history, and a physical exam. You may have tests, such as: Blood tests to measure uric acid levels. Removal of joint fluid with a thin needle (aspiration) to look for uric acid crystals. X-rays to look for joint damage. How is this treated? Treatment for this condition has two phases: treating an acute attack and preventing future attacks. Acute gout treatment may include medicines to reduce pain and swelling, including: NSAIDs, such as ibuprofen. Steroids. These are strong anti-inflammatory medicines that can be taken by mouth (orally) or injected into a joint. Colchicine. This medicine relieves pain and swelling when it is taken soon after an attack. It can be given by mouth or through an IV. Preventive treatment may include: Daily use of smaller doses of NSAIDs or colchicine. Use of a medicine that reduces uric acid levels in your blood, such as allopurinol. Changes to your diet. You may need to see   a dietitian about what to eat and drink to prevent gout. Follow these instructions at home: During a gout attack  If directed, put ice on the affected area. To do this: Put ice in a plastic bag. Place a towel between your skin and the bag. Leave the  ice on for 20 minutes, 2-3 times a day. Remove the ice if your skin turns bright red. This is very important. If you cannot feel pain, heat, or cold, you have a greater risk of damage to the area. Raise (elevate) the affected joint above the level of your heart as often as possible. Rest the joint as much as possible. If the affected joint is in your leg, you may be given crutches to use. Follow instructions from your health care provider about eating or drinking restrictions. Avoiding future gout attacks Follow a low-purine diet as told by your dietitian or health care provider. Avoid foods and drinks that are high in purines, including liver, kidney, anchovies, asparagus, herring, mushrooms, mussels, and beer. Maintain a healthy weight or lose weight if you are overweight. If you want to lose weight, talk with your health care provider. Do not lose weight too quickly. Start or maintain an exercise program as told by your health care provider. Eating and drinking Avoid drinking beverages that contain fructose. Drink enough fluids to keep your urine pale yellow. If you drink alcohol: Limit how much you have to: 0-1 drink a day for women who are not pregnant. 0-2 drinks a day for men. Know how much alcohol is in a drink. In the [place] one drink equals one 12 oz bottle of beer (355 mL), one 5 oz glass of wine (148 mL), or one 1 oz glass of hard liquor (44 mL). General instructions Take over-the-counter and prescription medicines only as told by your health care provider. Ask your health care provider if the medicine prescribed to you requires you to avoid driving or using machinery. Return to your normal activities as told by your health care provider. Ask your health care provider what activities are safe for you. Keep all follow-up visits. This is important. Where to find more information National [employer] of [employer]: www.niams.nih.gov Contact a health care provider if you have: Another  gout attack. Continuing symptoms of a gout attack after 10 days of treatment. Side effects from your medicines. Chills or a fever. Burning pain when you urinate. Pain in your lower back or abdomen. Get help right away if you: Have severe or uncontrolled pain. Cannot urinate. Summary Gout is painful swelling of the joints caused by having too much uric acid in the body. The most common site for gout to occur is in the big toe, but it can affect other joints in the body. Medicines and dietary changes can help to prevent and treat gout attacks. This information is not intended to replace advice given to you by your health care provider. Make sure you discuss any questions you have with your health care provider. Document Revised: 06/04/2021 Document Reviewed: 06/04/2021 Elsevier Patient Education  2024 [employer]  

## 2023-04-07 NOTE — Progress Notes (Signed)
Subjective:   Valerie Jackson is a 77 y.o. female who presents for Medicare Annual (Subsequent) preventive examination.  Visit Complete: In person  Patient Medicare AWV questionnaire was completed by the patient on 04/03/2023; I have confirmed that all information answered by patient is correct and no changes since this date.  Review of Systems     Cardiac Risk Factors include: advanced age (>92men, >48 women);dyslipidemia;hypertension;sedentary lifestyle;family history of premature cardiovascular disease     Objective:    Today's Vitals   04/07/23 0909  BP: 124/78  Pulse: 66  Temp: 97.8 F (36.6 C)  TempSrc: Temporal  SpO2: 97%  Weight: 195 lb 6.4 oz (88.6 kg)  Height: 5\' 5"  (1.651 m)  PainSc: 6    Body mass index is 32.52 kg/m.     04/07/2023    9:31 AM 04/06/2022   11:44 AM 07/05/2020    4:13 PM 05/27/2020   12:55 PM 09/24/2019    9:15 PM 11/03/2017   10:31 AM 06/21/2017    8:24 PM  Advanced Directives  Does Patient Have a Medical Advance Directive? No No Yes No No No No  Does patient want to make changes to medical advance directive?   No - Patient declined      Would patient like information on creating a medical advance directive? No - Patient declined No - Patient declined  Yes (ED - Information included in AVS)  No - Patient declined No - Patient declined    Current Medications (verified) Outpatient Encounter Medications as of 04/07/2023  Medication Sig   acetaminophen (TYLENOL) 500 MG tablet Take 1 tablet (500 mg total) by mouth every 6 (six) hours as needed.   allopurinol (ZYLOPRIM) 100 MG tablet Take 1 tablet (100 mg total) by mouth daily.   ALPRAZolam (XANAX) 0.5 MG tablet TAKE ONE TABLET BY MOUTH AT BEDTIME AS NEEDED FOR ANXIETY   amLODipine (NORVASC) 5 MG tablet Take 1 tablet (5 mg total) by mouth daily.   aspirin EC 81 MG tablet Take 81 mg by mouth daily. Swallow whole.   cholecalciferol (VITAMIN D) 1000 UNITS tablet Take 2,000 Units by mouth daily.    colchicine 0.6 MG tablet 2 tabs po x 1, then one tab po 1 hour later   colestipol (COLESTID) 1 g tablet Take 1 tablet (1 g total) by mouth 2 (two) times daily.   cyanocobalamin (VITAMIN B12) 1000 MCG/ML injection Inject 1 mL (1,000 mcg total) into the muscle every 30 (thirty) days.   folic acid (FOLVITE) 1 MG tablet TAKE ONE TABLET BY MOUTH ONCE A DAY   hydrALAZINE (APRESOLINE) 25 MG tablet TAKE ONE TABLET BY MOUTH THREE TIMES DAILY   indapamide (LOZOL) 1.25 MG tablet Take 1.25 mg by mouth daily.   levothyroxine (SYNTHROID) 137 MCG tablet TAKE ONE TABLET BY MOUTH ONCE A DAY BEFORE BREAKFAST   nebivolol (BYSTOLIC) 5 MG tablet Take 1 tablet (5 mg total) by mouth daily.   Pitavastatin Calcium 2 MG TABS Take 1 tablet (2 mg total) by mouth daily.   predniSONE (DELTASONE) 20 MG tablet Take 2 tablets (40 mg total) by mouth daily with breakfast for 5 days.   rizatriptan (MAXALT-MLT) 10 MG disintegrating tablet Take 1 tablet (10 mg total) by mouth as needed for migraine. May repeat in 2 hours if needed   SYRINGE-NEEDLE, DISP, 3 ML 25G X 5/8" 3 ML MISC 1 each by Does not apply route every 30 (thirty) days.   [DISCONTINUED] potassium chloride SA (KLOR-CON M15) 15  MEQ tablet Take 1 tablet (15 mEq total) by mouth 3 (three) times daily.   No facility-administered encounter medications on file as of 04/07/2023.    Allergies (verified) Crestor [rosuvastatin] and Estrogens   History: Past Medical History:  Diagnosis Date   Arthritis    fingers   Blood clot in vein 1968   Cataracts, bilateral    CHF (congestive heart failure) (HCC)    Chronic kidney disease    CKD stage 2   COPD (chronic obstructive pulmonary disease) (HCC)    Depression    Diverticulitis 09/2013   with perforation   Dizziness    with decreased BP readings or bending over   Floaters    GERD (gastroesophageal reflux disease)    H/O hiatal hernia    Headache(784.0)    migraine every 5- to 6 years   Heart murmur since age 79    mild   Hepatitis age 41 or 9   had heapatitis, not sure which kind   History of blood transfusion 1965   History of frequent urinary tract infections    Hypercholesteremia    Hypertension    Hypothyroid    Obesity    Pericolonic abscess 2015   Pneumonia    Sickle cell trait (HCC)    Past Surgical History:  Procedure Laterality Date   CHOLECYSTECTOMY  2002   COLON RESECTION N/A 10/11/2013   Procedure: EXPLORATORY LAPAROTOMY WITH RECTOSIGMOID COLECTOMY AND END COLOSTOMY;  Surgeon: Almond Lint, MD;  Location: [facility];  Service: General;  Laterality: N/A;   COLOSTOMY TAKEDOWN N/A 02/27/2014   Procedure: COLOSTOMY TAKEDOWN [place] HERNIA REPAIR;  Surgeon: Almond Lint, MD;  Location: [facility];  Service: General;  Laterality: N/A;   INCISIONAL HERNIA REPAIR N/A 03/04/2016   Procedure: LAPAROSCOPIC INCISIONAL HERNIA WITH MESH;  Surgeon: Axel Filler, MD;  Location: [facility];  Service: General;  Laterality: N/A;   LAPAROSCOPIC LYSIS OF ADHESIONS N/A 03/04/2016   Procedure: LAPAROSCOPIC LYSIS OF ADHESIONS;  Surgeon: Axel Filler, MD;  Location: [facility];  Service: General;  Laterality: N/A;   RIGHT HEART CATH N/A 06/24/2017   Procedure: RIGHT HEART CATH;  Surgeon: Dolores Patty, MD;  Location: [facility];  Service: Cardiovascular;  Laterality: N/A;   TONSILLECTOMY  1952   Family History  Problem Relation Age of Onset   Hypertension Mother    Aneurysm Mother        Cerebral (died from this)   Cancer Mother        Uterine   Cancer Other    Arthritis Sister    Hyperlipidemia Sister    Hypertension Sister    Stroke Sister    Kidney disease Sister        Cancer   Hyperlipidemia Brother    Glaucoma Brother    Thyroid cancer Daughter    Breast cancer Maternal Grandmother 31   Hypertension Maternal Grandmother    Hypercalcemia Neg Hx    Social History   Socioeconomic History   Marital status: Widowed    Spouse name: Not on file   Number of children: 1   Years of  education: 14   Highest education level: Associate degree: occupational, Scientist, product/process development, or vocational program  Occupational History   Occupation: Retired    Comment: Charity fundraiser  Tobacco Use   Smoking status: Former    Current packs/day: 0.00    Average packs/day: 0.5 packs/day for 52.0 years (26.0 ttl pk-yrs)    Types: Cigarettes    Start date: 09/06/1960  Quit date: 09/06/2012    Years since quitting: 10.5   Smokeless tobacco: Never   Tobacco comments:    never smoked a full PPD.   Vaping Use   Vaping status: Never Used  Substance and Sexual Activity   Alcohol use: No    Alcohol/week: 0.0 standard drinks of alcohol    Comment: very rarely   Drug use: No   Sexual activity: Not Currently  Other Topics Concern   Not on file  Social History Narrative   Regular exercise-no   Caffeine Use-yes   Social Determinants of Health   Financial Resource Strain: Low Risk  (04/07/2023)   Overall Financial Resource Strain (CARDIA)    Difficulty of Paying Living Expenses: Not hard at all  Food Insecurity: No Food Insecurity (04/07/2023)   Hunger Vital Sign    Worried About Running Out of Food in the Last Year: Never true    Ran Out of Food in the Last Year: Never true  Transportation Needs: No Transportation Needs (04/07/2023)   PRAPARE - Administrator, Civil Service (Medical): No    Lack of Transportation (Non-Medical): No  Physical Activity: Inactive (04/07/2023)   Exercise Vital Sign    Days of Exercise per Week: 0 days    Minutes of Exercise per Session: 0 min  Stress: Stress Concern Present (04/07/2023)   [employer] of Occupational Health - Occupational Stress Questionnaire    Feeling of Stress : To some extent  Social Connections: Unknown (04/07/2023)   Social Connection and Isolation Panel [NHANES]    Frequency of Communication with Friends and Family: Once a week    Frequency of Social Gatherings with Friends and Family: Never    Attends Religious Services: Patient  declined    Database administrator or Organizations: No    Attends Banker Meetings: Never    Marital Status: Widowed    Tobacco Counseling Counseling given: Not Answered Tobacco comments: never smoked a full PPD.    Clinical Intake:  Pre-visit preparation completed: Yes  Pain : 0-10 Pain Score: 6  Pain Location: Foot (Gout) Pain Orientation: Left, Right     BMI - recorded: 32.52 Nutritional Status: BMI > 30  Obese Nutritional Risks: None Diabetes: No  How often do you need to have someone help you when you read instructions, pamphlets, or other written materials from your doctor or pharmacy?: 1 - Never What is the last grade level you completed in school?: 3RD YEAR OF COLLEGE; [employer] N[employer]  Interpreter Needed?: No  Information entered by :: Susie Cassette, LPN.   Activities of Daily Living    04/07/2023    9:14 AM 04/03/2023    9:03 PM  In your present state of health, do you have any difficulty performing the following activities:  Hearing? 0 0  Vision? 0 0  Walking or climbing stairs? 0 0  Dressing or bathing? 0 0  Doing errands, shopping? 0 0  Preparing Food and eating ? N N  Using the Toilet? N N  In the past six months, have you accidently leaked urine? Y Y  Do you have problems with loss of bowel control? N N  Managing your Medications? N N  Managing your Finances? N N  Housekeeping or managing your Housekeeping? N N    Patient Care Team: Etta Grandchild, MD as PCP - General (Internal Medicine) Romero Belling, MD (Inactive) as Consulting Physician (Endocrinology) Ok Edwards, MD (Inactive) as Consulting Physician (Gynecology) Marina Goodell,  Wilhemina Bonito, MD as Consulting Physician (Gastroenterology) Genia Del, MD as Consulting Physician (Obstetrics and Gynecology) Janet Berlin, MD as Consulting Physician (Ophthalmology)  Indicate any recent Medical Services you may have received from other than [facility] providers in the past year (date  may be approximate).     Assessment:   This is a routine wellness examination for [place].  Hearing/Vision screen Hearing Screening - Comments:: Denies hearing difficulties   Vision Screening - Comments:: Wears rx glasses; cataracts removed - up to date with routine eye exams with Janet Berlin, MD.   Dietary issues and exercise activities discussed:     Goals Addressed             This Visit's Progress    My goal for 2024 is to make it to 2025.        Depression Screen    04/07/2023    9:32 AM 01/05/2023   11:28 AM 04/06/2022   11:49 AM 01/12/2022   10:50 AM 07/05/2020    4:11 PM 01/17/2020    1:15 PM 08/02/2019   11:55 AM  PHQ 2/9 Scores  PHQ - 2 Score 3 2 0 2 0 0 2  PHQ- 9 Score 8 10  11  3 3     Fall Risk    04/07/2023    9:13 AM 04/03/2023    9:03 PM 01/05/2023   11:27 AM 04/06/2022   11:45 AM 01/12/2022   10:51 AM  Fall Risk   Falls in the past year? 0 0 0 0 0  Number falls in past yr: 0 0 0 0   Injury with Fall? 0 0 0 0   Risk for fall due to : No Fall Risks  No Fall Risks No Fall Risks   Follow up Falls prevention discussed  Falls evaluation completed Falls evaluation completed     MEDICARE RISK AT HOME:  Medicare Risk at Home - 04/07/23 0914     Any stairs in or around the home? Yes    If so, are there any without handrails? Yes    Home free of loose throw rugs in walkways, pet beds, electrical cords, etc? No    Adequate lighting in your home to reduce risk of falls? Yes    Life alert? No    Use of a cane, walker or w/c? No    Grab bars in the bathroom? No    Shower chair or bench in shower? No    Elevated toilet seat or a handicapped toilet? No             TIMED UP AND GO:  Was the test performed?  Yes  Length of time to ambulate 10 feet: 10 sec Gait steady and fast without use of assistive device    Cognitive Function:        04/07/2023    9:14 AM 04/06/2022   11:57 AM 07/05/2020    4:15 PM  6CIT Screen  What Year? 0 points 0 points  0 points  What month? 0 points 0 points 0 points  What time? 0 points 0 points 0 points  Count back from 20 0 points 0 points 0 points  Months in reverse 0 points 0 points 0 points  Repeat phrase 0 points 0 points 0 points  Total Score 0 points 0 points 0 points    Immunizations Immunization History  Administered Date(s) Administered   Fluad Quad(high Dose 65+) 05/26/2019, 06/17/2020, 07/14/2021, 08/13/2022   Influenza, High Dose Seasonal PF  07/19/2013, 09/28/2018   PFIZER(Purple Top)SARS-COV-2 Vaccination 12/09/2019, 12/30/2019, 08/15/2020   Pneumococcal Conjugate-13 07/19/2013   Pneumococcal Polysaccharide-23 09/25/2015   Td 09/15/2011    TDAP status: Due, Education has been provided regarding the importance of this vaccine. Advised may receive this vaccine at local pharmacy or Health Dept. Aware to provide a copy of the vaccination record if obtained from local pharmacy or Health Dept. Verbalized acceptance and understanding.  Flu Vaccine status: Up to date  Pneumococcal vaccine status: Up to date  Covid-19 vaccine status: Completed vaccines  Qualifies for Shingles Vaccine? Yes   Zostavax completed No   Shingrix Completed?: No.    Education has been provided regarding the importance of this vaccine. Patient has been advised to call insurance company to determine out of pocket expense if they have not yet received this vaccine. Advised may also receive vaccine at local pharmacy or Health Dept. Verbalized acceptance and understanding.  Screening Tests Health Maintenance  Topic Date Due   Zoster Vaccines- Shingrix (1 of 2) Never done   Lung Cancer Screening  01/17/2020   DTaP/Tdap/Td (2 - Tdap) 09/14/2021   COVID-19 Vaccine (4 - 2023-24 season) 05/15/2022   INFLUENZA VACCINE  04/15/2023   Colonoscopy  03/14/2024   Medicare Annual Wellness (AWV)  04/06/2024   Pneumonia Vaccine 34+ Years old  Completed   DEXA SCAN  Completed   Hepatitis C Screening  Completed   HPV VACCINES   Aged Out    Health Maintenance  Health Maintenance Due  Topic Date Due   Zoster Vaccines- Shingrix (1 of 2) Never done   Lung Cancer Screening  01/17/2020   DTaP/Tdap/Td (2 - Tdap) 09/14/2021   COVID-19 Vaccine (4 - 2023-24 season) 05/15/2022    Colorectal cancer screening: Type of screening: Colonoscopy. Completed 03/15/2019. Repeat every 5 years  Mammogram status: Completed 04/20/2022. Repeat every year  Bone Density status: Completed 10/01/2022. Results reflect: Bone density results: OSTEOPENIA. Repeat every 2-3 years.  Lung Cancer Screening: (Low Dose CT Chest recommended if Age 71-80 years, 20 pack-year currently smoking OR have quit w/in 15years.) does qualify.   Lung Cancer Screening Referral: no; will discuss with pcp  Additional Screening:  Hepatitis C Screening: does qualify; Completed 07/19/2013  Vision Screening: Recommended annual ophthalmology exams for early detection of glaucoma and other disorders of the eye. Is the patient up to date with their annual eye exam?  Yes  Who is the provider or what is the name of the office in which the patient attends annual eye exams? Janet Berlin, MD. If pt is not established with a provider, would they like to be referred to a provider to establish care? No .   Dental Screening: Recommended annual dental exams for proper oral hygiene  Diabetic Foot Exam: N/A  Community Resource Referral / Chronic Care Management: CRR required this visit?  No   CCM required this visit?  No     Plan:     I have personally reviewed and noted the following in the patient's chart:   Medical and social history Use of alcohol, tobacco or illicit drugs  Current medications and supplements including opioid prescriptions. Patient is not currently taking opioid prescriptions. Functional ability and status Nutritional status Physical activity Advanced directives List of other physicians Hospitalizations, surgeries, and ER visits in previous  12 months Vitals Screenings to include cognitive, depression, and falls Referrals and appointments  In addition, I have reviewed and discussed with patient certain preventive protocols, quality metrics, and best practice recommendations.  A written personalized care plan for preventive services as well as general preventive health recommendations were provided to patient.     Mickeal Needy, LPN   1/85/6314   After Visit Summary: Printed and given to patient at visit.  Nurse Notes: Normal cognitive status assessed by direct observation by this Nurse Health Advisor. No abnormalities found.

## 2023-04-07 NOTE — Progress Notes (Signed)
Subjective:  Patient ID: Valerie Jackson, female    DOB: 1946-08-24  Age: 77 y.o. MRN: [id number]  CC: Hypertension and Hypothyroidism   HPI [place] presents for f/up ----  Discussed the use of AI scribe software for clinical note transcription with the patient, who gave verbal consent to proceed.  History of Present Illness   The patient, with a history of gout, presented with a severe gout attack affecting the foot. The affected area was significantly swollen, to the point where the patient was unable to wear a slipper. The swelling extended from the foot up to the lower leg. The patient sought care at an urgent care center and was prescribed prednisone, which they started taking two days prior to this consultation. The patient reported that the prednisone has helped reduce the swelling. They also reported taking colchicine and oxycodone for pain management, with only two doses of oxycodone remaining. The patient denied any associated symptoms such as fever, chills, or night sweats. They also reported that this is the third gout attack they have experienced, with two previous attacks affecting the other foot. The patient's last uric acid level check was likely during their last visit to a nephrologist, which was approximately four months prior to this consultation. The patient requested a refill of the oxycodone prescription. They denied any current use of tobacco or alcohol.       Outpatient Medications Prior to Visit  Medication Sig Dispense Refill   allopurinol (ZYLOPRIM) 100 MG tablet Take 1 tablet (100 mg total) by mouth daily. 90 tablet 1   ALPRAZolam (XANAX) 0.5 MG tablet TAKE ONE TABLET BY MOUTH AT BEDTIME AS NEEDED FOR ANXIETY 30 tablet 2   amLODipine (NORVASC) 5 MG tablet Take 1 tablet (5 mg total) by mouth daily. 90 tablet 1   aspirin EC 81 MG tablet Take 81 mg by mouth daily. Swallow whole.     cholecalciferol (VITAMIN D) 1000 UNITS tablet Take 2,000 Units by mouth daily.      colchicine 0.6 MG tablet 2 tabs po x 1, then one tab po 1 hour later 6 tablet 0   colestipol (COLESTID) 1 g tablet Take 1 tablet (1 g total) by mouth 2 (two) times daily. 60 tablet 11   cyanocobalamin (VITAMIN B12) 1000 MCG/ML injection Inject 1 mL (1,000 mcg total) into the muscle every 30 (thirty) days. 4 mL 3   folic acid (FOLVITE) 1 MG tablet TAKE ONE TABLET BY MOUTH ONCE A DAY 90 tablet 0   hydrALAZINE (APRESOLINE) 25 MG tablet TAKE ONE TABLET BY MOUTH THREE TIMES DAILY 270 tablet 0   indapamide (LOZOL) 1.25 MG tablet Take 1.25 mg by mouth daily.     levothyroxine (SYNTHROID) 137 MCG tablet TAKE ONE TABLET BY MOUTH ONCE A DAY BEFORE BREAKFAST 90 tablet 0   nebivolol (BYSTOLIC) 5 MG tablet Take 1 tablet (5 mg total) by mouth daily. 90 tablet 0   Pitavastatin Calcium 2 MG TABS Take 1 tablet (2 mg total) by mouth daily. 90 tablet 1   predniSONE (DELTASONE) 20 MG tablet Take 2 tablets (40 mg total) by mouth daily with breakfast for 5 days. 10 tablet 0   rizatriptan (MAXALT-MLT) 10 MG disintegrating tablet Take 1 tablet (10 mg total) by mouth as needed for migraine. May repeat in 2 hours if needed 30 tablet 1   SYRINGE-NEEDLE, DISP, 3 ML 25G X 5/8" 3 ML MISC 1 each by Does not apply route every 30 (thirty) days. 50  each 0   acetaminophen (TYLENOL) 500 MG tablet Take 1 tablet (500 mg total) by mouth every 6 (six) hours as needed. 30 tablet 0   potassium chloride SA (KLOR-CON M15) 15 MEQ tablet Take 1 tablet (15 mEq total) by mouth 3 (three) times daily. 270 tablet 0   No facility-administered medications prior to visit.    ROS Review of Systems  Constitutional: Negative.  Negative for appetite change, chills, diaphoresis and fatigue.  HENT: Negative.    Eyes: Negative.   Respiratory: Negative.  Negative for cough, chest tightness and shortness of breath.   Cardiovascular:  Negative for chest pain, palpitations and leg swelling.  Gastrointestinal:  Negative for abdominal pain, diarrhea,  nausea and vomiting.  Genitourinary: Negative.  Negative for difficulty urinating.  Musculoskeletal:  Positive for arthralgias, gait problem and joint swelling.  Hematological:  Negative for adenopathy. Does not bruise/bleed easily.  Psychiatric/Behavioral: Negative.      Objective:  BP 124/78   Pulse 66   Temp 97.8 F (36.6 C) (Oral)   Resp 16   Ht 5\' 5"  (1.651 m)   Wt 195 lb 6.4 oz (88.6 kg)   SpO2 97%   BMI 32.52 kg/m   BP Readings from Last 3 Encounters:  04/07/23 124/78  04/07/23 124/78  04/04/23 132/80    Wt Readings from Last 3 Encounters:  04/07/23 195 lb 6.4 oz (88.6 kg)  04/07/23 195 lb 6.4 oz (88.6 kg)  01/05/23 200 lb 3.2 oz (90.8 kg)    Physical Exam Vitals reviewed.  Constitutional:      Appearance: She is ill-appearing.  HENT:     Mouth/Throat:     Mouth: Mucous membranes are moist.  Eyes:     General: No scleral icterus.    Conjunctiva/sclera: Conjunctivae normal.  Cardiovascular:     Rate and Rhythm: Normal rate and regular rhythm.     Pulses: Normal pulses.  Pulmonary:     Breath sounds: No stridor. No wheezing, rhonchi or rales.  Abdominal:     General: Abdomen is flat.     Palpations: There is no mass.     Tenderness: There is no abdominal tenderness. There is no guarding.     Hernia: No hernia is present.  Musculoskeletal:        General: Swelling present.     Cervical back: Neck supple.     Right lower leg: No edema.     Left lower leg: No edema.  Lymphadenopathy:     Cervical: No cervical adenopathy.  Skin:    General: Skin is warm and dry.  Neurological:     General: No focal deficit present.     Mental Status: She is alert.     Lab Results  Component Value Date   WBC 8.2 01/04/2023   HGB 13.4 01/04/2023   HCT 40 01/04/2023   PLT 196 01/04/2023   GLUCOSE 131 (H) 01/12/2022   CHOL 219 (H) 01/05/2023   TRIG 148.0 01/05/2023   HDL 52.40 01/05/2023   LDLDIRECT 165.0 06/17/2020   LDLCALC 137 (H) 01/05/2023   ALT 36 (H)  01/05/2023   AST 38 (H) 01/05/2023   NA 142 01/04/2023   K 3.6 01/04/2023   CL 106 01/04/2023   CREATININE 1.6 (A) 01/04/2023   BUN 11 01/04/2023   CO2 26 (A) 01/04/2023   TSH 6.55 (H) 01/05/2023   INR 1.02 06/24/2017   HGBA1C 4.8 01/05/2023    No results found.  Assessment & Plan:  CKD (chronic  kidney disease) stage 2, GFR 60-89 ml/min- Will avoid NSAIDs.  Atherosclerosis of aorta (HCC)- Risk factor modifications addressed.  Primary hypertension- Her blood pressure is adequately well-controlled.  Acquired hypothyroidism-she is euthyroid.  Acute gout due to renal impairment involving left foot -     oxyCODONE-Acetaminophen; Take 1 tablet by mouth every 8 (eight) hours as needed for up to 8 days.  Dispense: 25 tablet; Refill: 0     Follow-up: Return in about 3 months (around 07/08/2023).  Sanda Linger, MD

## 2023-04-14 DIAGNOSIS — N1832 Chronic kidney disease, stage 3b: Secondary | ICD-10-CM | POA: Diagnosis not present

## 2023-04-14 LAB — COMPREHENSIVE METABOLIC PANEL
Albumin: 4.1 (ref 3.5–5.0)
Calcium: 9.9 (ref 8.7–10.7)
eGFR: 29

## 2023-04-14 LAB — CBC AND DIFFERENTIAL
HCT: 41 (ref 36–46)
Hemoglobin: 13.6 (ref 12.0–16.0)
WBC: 9.5

## 2023-04-14 LAB — BASIC METABOLIC PANEL
BUN: 12 (ref 4–21)
CO2: 29 — AB (ref 13–22)
Chloride: 105 (ref 99–108)
Creatinine: 1.8 — AB (ref 0.5–1.1)
Glucose: 91
Potassium: 3.5 meq/L (ref 3.5–5.1)
Sodium: 142 (ref 137–147)

## 2023-04-19 DIAGNOSIS — D631 Anemia in chronic kidney disease: Secondary | ICD-10-CM | POA: Diagnosis not present

## 2023-04-19 DIAGNOSIS — I129 Hypertensive chronic kidney disease with stage 1 through stage 4 chronic kidney disease, or unspecified chronic kidney disease: Secondary | ICD-10-CM | POA: Diagnosis not present

## 2023-04-19 DIAGNOSIS — N1832 Chronic kidney disease, stage 3b: Secondary | ICD-10-CM | POA: Diagnosis not present

## 2023-04-19 DIAGNOSIS — N2581 Secondary hyperparathyroidism of renal origin: Secondary | ICD-10-CM | POA: Diagnosis not present

## 2023-04-22 ENCOUNTER — Ambulatory Visit: Payer: Medicare Other

## 2023-04-26 ENCOUNTER — Other Ambulatory Visit: Payer: Self-pay | Admitting: Physician Assistant

## 2023-04-27 ENCOUNTER — Ambulatory Visit
Admission: RE | Admit: 2023-04-27 | Discharge: 2023-04-27 | Disposition: A | Discharge status: Home / Self Care | Payer: Medicare Other | Source: Ambulatory Visit | Attending: Internal Medicine | Admitting: Internal Medicine

## 2023-04-27 DIAGNOSIS — Z1231 Encounter for screening mammogram for malignant neoplasm of breast: Secondary | ICD-10-CM | POA: Diagnosis not present

## 2023-04-30 ENCOUNTER — Encounter: Payer: Self-pay | Admitting: Internal Medicine

## 2023-05-16 ENCOUNTER — Other Ambulatory Visit: Payer: Self-pay | Admitting: Internal Medicine

## 2023-05-16 DIAGNOSIS — I10 Essential (primary) hypertension: Secondary | ICD-10-CM

## 2023-05-25 ENCOUNTER — Other Ambulatory Visit: Payer: Self-pay | Admitting: Physician Assistant

## 2023-05-25 ENCOUNTER — Other Ambulatory Visit: Payer: Self-pay | Admitting: Internal Medicine

## 2023-05-25 NOTE — Telephone Encounter (Signed)
Pt is calling mentioning that she need this medication refilled if possible. Dr Marina Goodell prescribed the medicine.

## 2023-06-18 ENCOUNTER — Other Ambulatory Visit: Payer: Self-pay | Admitting: Internal Medicine

## 2023-06-18 ENCOUNTER — Other Ambulatory Visit: Payer: Self-pay | Admitting: Physician Assistant

## 2023-06-18 DIAGNOSIS — E538 Deficiency of other specified B group vitamins: Secondary | ICD-10-CM

## 2023-06-18 DIAGNOSIS — E039 Hypothyroidism, unspecified: Secondary | ICD-10-CM

## 2023-06-18 DIAGNOSIS — I10 Essential (primary) hypertension: Secondary | ICD-10-CM

## 2023-06-20 ENCOUNTER — Other Ambulatory Visit: Payer: Self-pay | Admitting: Physician Assistant

## 2023-07-08 ENCOUNTER — Ambulatory Visit (INDEPENDENT_AMBULATORY_CARE_PROVIDER_SITE_OTHER): Payer: Medicare Other | Admitting: Internal Medicine

## 2023-07-08 ENCOUNTER — Encounter: Payer: Self-pay | Admitting: Internal Medicine

## 2023-07-08 ENCOUNTER — Other Ambulatory Visit: Payer: Self-pay | Admitting: Physician Assistant

## 2023-07-08 ENCOUNTER — Other Ambulatory Visit: Payer: Self-pay | Admitting: Internal Medicine

## 2023-07-08 VITALS — BP 148/96 | HR 93 | Temp 97.6°F | Resp 16 | Ht 65.0 in | Wt 188.0 lb

## 2023-07-08 DIAGNOSIS — N184 Chronic kidney disease, stage 4 (severe): Secondary | ICD-10-CM | POA: Diagnosis not present

## 2023-07-08 DIAGNOSIS — K7581 Nonalcoholic steatohepatitis (NASH): Secondary | ICD-10-CM

## 2023-07-08 DIAGNOSIS — K9089 Other intestinal malabsorption: Secondary | ICD-10-CM

## 2023-07-08 DIAGNOSIS — E039 Hypothyroidism, unspecified: Secondary | ICD-10-CM | POA: Diagnosis not present

## 2023-07-08 DIAGNOSIS — N3000 Acute cystitis without hematuria: Secondary | ICD-10-CM | POA: Insufficient documentation

## 2023-07-08 DIAGNOSIS — T502X5A Adverse effect of carbonic-anhydrase inhibitors, benzothiadiazides and other diuretics, initial encounter: Secondary | ICD-10-CM

## 2023-07-08 DIAGNOSIS — I10 Essential (primary) hypertension: Secondary | ICD-10-CM | POA: Diagnosis not present

## 2023-07-08 DIAGNOSIS — R10814 Left lower quadrant abdominal tenderness: Secondary | ICD-10-CM | POA: Diagnosis not present

## 2023-07-08 DIAGNOSIS — E785 Hyperlipidemia, unspecified: Secondary | ICD-10-CM | POA: Diagnosis not present

## 2023-07-08 DIAGNOSIS — E876 Hypokalemia: Secondary | ICD-10-CM

## 2023-07-08 LAB — CBC WITH DIFFERENTIAL/PLATELET
Basophils Absolute: 0.1 10*3/uL (ref 0.0–0.1)
Basophils Relative: 1.2 % (ref 0.0–3.0)
Eosinophils Absolute: 0.7 10*3/uL (ref 0.0–0.7)
Eosinophils Relative: 7.6 % — ABNORMAL HIGH (ref 0.0–5.0)
HCT: 43.2 % (ref 36.0–46.0)
Hemoglobin: 14.2 g/dL (ref 12.0–15.0)
Lymphocytes Relative: 27.3 % (ref 12.0–46.0)
Lymphs Abs: 2.7 10*3/uL (ref 0.7–4.0)
MCHC: 33 g/dL (ref 30.0–36.0)
MCV: 94.6 fL (ref 78.0–100.0)
Monocytes Absolute: 0.6 10*3/uL (ref 0.1–1.0)
Monocytes Relative: 5.8 % (ref 3.0–12.0)
Neutro Abs: 5.7 10*3/uL (ref 1.4–7.7)
Neutrophils Relative %: 58.1 % (ref 43.0–77.0)
Platelets: 227 10*3/uL (ref 150.0–400.0)
RBC: 4.56 Mil/uL (ref 3.87–5.11)
RDW: 13.4 % (ref 11.5–15.5)
WBC: 9.8 10*3/uL (ref 4.0–10.5)

## 2023-07-08 LAB — URINALYSIS, ROUTINE W REFLEX MICROSCOPIC
Ketones, ur: NEGATIVE
Nitrite: POSITIVE — AB
Specific Gravity, Urine: 1.02 (ref 1.000–1.030)
Total Protein, Urine: 100 — AB
Urine Glucose: NEGATIVE
Urobilinogen, UA: 0.2 (ref 0.0–1.0)
pH: 6 (ref 5.0–8.0)

## 2023-07-08 LAB — BASIC METABOLIC PANEL
BUN: 7 mg/dL (ref 6–23)
CO2: 32 meq/L (ref 19–32)
Calcium: 10.9 mg/dL — ABNORMAL HIGH (ref 8.4–10.5)
Chloride: 101 meq/L (ref 96–112)
Creatinine, Ser: 1.73 mg/dL — ABNORMAL HIGH (ref 0.40–1.20)
GFR: 27.98 mL/min — ABNORMAL LOW (ref >60.00)
Glucose, Bld: 106 mg/dL — ABNORMAL HIGH (ref 70–99)
Potassium: 3 meq/L — ABNORMAL LOW (ref 3.5–5.1)
Sodium: 141 meq/L (ref 135–145)

## 2023-07-08 LAB — HEPATIC FUNCTION PANEL
ALT: 28 U/L (ref 0–35)
AST: 36 U/L (ref 0–37)
Albumin: 4.7 g/dL (ref 3.5–5.2)
Alkaline Phosphatase: 105 U/L (ref 39–117)
Bilirubin, Direct: 0.3 mg/dL (ref 0.0–0.3)
Total Bilirubin: 1.7 mg/dL — ABNORMAL HIGH (ref 0.2–1.2)
Total Protein: 8.1 g/dL (ref 6.0–8.3)

## 2023-07-08 LAB — PROTIME-INR
INR: 1.2 {ratio} — ABNORMAL HIGH (ref 0.8–1.0)
Prothrombin Time: 12.2 s (ref 9.6–13.1)

## 2023-07-08 LAB — TSH: TSH: 6.14 u[IU]/mL — ABNORMAL HIGH (ref 0.35–5.50)

## 2023-07-08 LAB — AMYLASE: Amylase: 27 U/L (ref 27–131)

## 2023-07-08 LAB — LIPASE: Lipase: 3 U/L — ABNORMAL LOW (ref 11.0–59.0)

## 2023-07-08 MED ORDER — POTASSIUM CHLORIDE ER 10 MEQ PO TBCR
10.0000 meq | EXTENDED_RELEASE_TABLET | Freq: Every day | ORAL | 0 refills | Status: DC
Start: 2023-07-08 — End: 2023-10-18

## 2023-07-08 MED ORDER — TERAZOSIN HCL 2 MG PO CAPS: 2.0000 mg | ORAL_CAPSULE | Freq: Every day | ORAL | 0 refills | Status: DC

## 2023-07-08 MED ORDER — SULFAMETHOXAZOLE-TRIMETHOPRIM 400-80 MG PO TABS
1.0000 | ORAL_TABLET | Freq: Two times a day (BID) | ORAL | 0 refills | Status: AC
Start: 2023-07-08 — End: 2023-07-13

## 2023-07-08 MED ORDER — COLESTIPOL HCL 1 G PO TABS
1.0000 g | ORAL_TABLET | Freq: Two times a day (BID) | ORAL | 0 refills | Status: DC
Start: 2023-07-08 — End: 2024-01-26

## 2023-07-08 NOTE — Patient Instructions (Signed)
 Chronic Kidney Disease, Adult Chronic kidney disease (CKD) occurs when the kidneys are slowly and permanently damaged over a long period of time. The kidneys are a pair of organs that do many important jobs in the body, including: Removing waste and extra fluid from the blood to make urine. Making hormones that maintain the amount of fluid in tissues and blood vessels. Maintaining the right amount of fluids and chemicals in the body. A small amount of kidney damage may not cause problems, but a large amount of damage may make it hard or impossible for the kidneys to work right. Steps must be taken to slow kidney damage or to stop it from getting worse. If steps are not taken, the kidneys may stop working permanently (end-stage renal disease, or ESRD). Most of the time, CKD does not go away, but it can often be controlled. People who have CKD are usually able to live full lives. What are the causes? The most common causes of this condition are diabetes and high blood pressure (hypertension). Other causes include: Cardiovascular diseases. These affect the heart and blood vessels. Kidney diseases. These include: Glomerulonephritis, or inflammation of the tiny filters in the kidneys. Interstitial nephritis. This is swelling of the small tubes of the kidneys and of the surrounding structures. Polycystic kidney disease, in which clusters of fluid-filled sacs form within the kidneys. Renal vascular disease. This includes disorders that affect the arteries and veins of the kidneys. Diseases that affect the body's defense system (immune system). A problem with urine flow. This may be caused by: Kidney stones. Cancer. An enlarged prostate, in males. A kidney infection or urinary tract infection (UTI) that keeps coming back. Vasculitis. This is swelling or inflammation of the blood vessels. What increases the risk? Your chances of having kidney disease increase with age. The following factors may make  you more likely to develop this condition: A family history of kidney disease or kidney failure. Kidney failure means the kidneys can no longer work right. Certain genetic diseases. Taking medicines often that are damaging to the kidneys. Being around or being in contact with toxic substances. Obesity. A history of tobacco use. What are the signs or symptoms? Symptoms of this condition include: Feeling very tired (lethargic) and having less energy. Swelling, or edema, of the face, legs, ankles, or feet. Nausea or vomiting, or loss of appetite. Confusion or trouble concentrating. Muscle twitches and cramps, especially in the legs. Dry, itchy skin. A metallic taste in the mouth. Producing less urine, or producing more urine (especially at night). Shortness of breath. Trouble sleeping. CKD may also result in not having enough red blood cells or hemoglobin in the blood (anemia) or having weak bones (bone disease). Symptoms develop slowly and may not be obvious until the kidney damage becomes severe. It is possible to have kidney disease for years without having symptoms. How is this diagnosed? This condition may be diagnosed based on: Blood tests. Urine tests. Imaging tests, such as an ultrasound or a CT scan. A kidney biopsy. This involves removing a sample of kidney tissue to be looked at under a microscope. Results from these tests will help to determine how serious the CKD is. How is this treated? There is no cure for most cases of this condition, but treatment usually relieves symptoms and prevents or slows the worsening of the disease. Treatment may include: Diet changes, which may require you to avoid alcohol and foods that are high in salt, potassium, phosphorous, and protein. Medicines. These may:  Lower blood pressure. Control blood sugar (glucose). Relieve anemia. Relieve swelling. Protect your bones. Improve the balance of salts and minerals in your blood  (electrolytes). Dialysis, which is a type of treatment that removes toxic waste from the body. It may be needed if you have kidney failure. Managing any other conditions that are causing your CKD or making it worse. Follow these instructions at home: Medicines Take over-the-counter and prescription medicines only as told by your health care provider. The amount of some medicines that you take may need to be changed. Do not take any new medicines unless approved by your health care provider. Many medicines can make kidney damage worse. Do not take any vitamin and mineral supplements unless approved by your health care provider. Many nutritional supplements can make kidney damage worse. Lifestyle  Do not use any products that contain nicotine or tobacco, such as cigarettes, e-cigarettes, and chewing tobacco. If you need help quitting, ask your health care provider. If you drink alcohol: Limit how much you use to: 0-1 drink a day for women who are not pregnant. 0-2 drinks a day for men. Know how much alcohol is in your drink. In the [place] one drink equals one 12 oz bottle of beer (355 mL), one 5 oz glass of wine (148 mL), or one 1 oz glass of hard liquor (44 mL). Maintain a healthy weight. If you need help, ask your health care provider. General instructions  Follow instructions from your health care provider about eating or drinking restrictions, including any prescribed diet. Track your blood pressure at home. Report changes in your blood pressure as told. If you are being treated for diabetes, track your blood glucose levels as told. Start or continue an exercise plan. Exercise at least 30 minutes a day, 5 days a week. Keep your immunizations up to date as told. Keep all follow-up visits. This is important. Where to find more information American Association of Kidney Patients: [employer].is [employer]: www.kidney.org American Kidney Fund: FightingMatch.com.ee Life Options:  www.lifeoptions.org Kidney School: www.kidneyschool.org Contact a health care provider if: Your symptoms get worse. You develop new symptoms. Get help right away if: You develop symptoms of ESRD. These include: Headaches. Numbness in your hands or feet. Easy bruising. Frequent hiccups. Chest pain. Shortness of breath. Lack of menstrual periods, in women. You have a fever. You are producing less urine than usual. You have pain or bleeding when you urinate or when you have a bowel movement. These symptoms may represent a serious problem that is an emergency. Do not wait to see if the symptoms will go away. Get medical help right away. Call your local emergency services (911 in the [place] Do not drive yourself to the hospital. Summary Chronic kidney disease (CKD) occurs when the kidneys become damaged slowly over a long period of time. The most common causes of this condition are diabetes and high blood pressure (hypertension). There is no cure for most cases of CKD, but treatment usually relieves symptoms and prevents or slows the worsening of the disease. Treatment may include a combination of lifestyle changes, medicines, and dialysis. This information is not intended to replace advice given to you by your health care provider. Make sure you discuss any questions you have with your health care provider. Document Revised: 12/01/2019 Document Reviewed: 12/06/2019 Elsevier Patient Education  2024 [employer].

## 2023-07-08 NOTE — Progress Notes (Signed)
Subjective:  Patient ID: Valerie Jackson, female    DOB: [birth year]  Age: 77 y.o. MRN: [id number]  CC: Hypertension, Anemia, and Hypothyroidism   HPI Valerie Jackson presents for f/up ----  Discussed the use of AI scribe software for clinical note transcription with the patient, who gave verbal consent to proceed.  History of Present Illness   The patient has been experiencing fatigue and decreased appetite for the past two to three weeks. She reports occasional nausea but denies vomiting, leg or feet swelling, headaches, blurred vision, and pain elsewhere. The patient has been managing her diarrhea with cholestyramine, which she takes once daily, although the prescription is for twice daily. Without this medication, she experiences severe, watery diarrhea, but no blood has been noted in the stool.   The patient also reports a localized pain in the abdominal area, which has been intermittent for the past month. The pain sometimes flares up and becomes tender, but pressure seems to alleviate it.       Outpatient Medications Prior to Visit  Medication Sig Dispense Refill   allopurinol (ZYLOPRIM) 100 MG tablet Take 1 tablet (100 mg total) by mouth daily. 90 tablet 1   ALPRAZolam (XANAX) 0.5 MG tablet TAKE ONE TABLET BY MOUTH AT BEDTIME AS NEEDED FOR ANXIETY 30 tablet 2   amLODipine (NORVASC) 5 MG tablet TAKE ONE TABLET BY MOUTH ONCE A DAY 90 tablet 0   cholecalciferol (VITAMIN D) 1000 UNITS tablet Take 2,000 Units by mouth daily.     colchicine 0.6 MG tablet 2 tabs po x 1, then one tab po 1 hour later 6 tablet 0   cyanocobalamin (VITAMIN B12) 1000 MCG/ML injection Inject 1 mL (1,000 mcg total) into the muscle every 30 (thirty) days. 4 mL 3   folic acid (FOLVITE) 1 MG tablet TAKE ONE TABLET BY MOUTH ONCE A DAY 90 tablet 0   hydrALAZINE (APRESOLINE) 25 MG tablet TAKE ONE TABLET BY MOUTH THREE TIMES DAILY 270 tablet 0   levothyroxine (SYNTHROID) 137 MCG tablet TAKE ONE TABLET BY MOUTH DAILY  BEFORE BREAKFAST 90 tablet 0   nebivolol (BYSTOLIC) 5 MG tablet Take 1 tablet (5 mg total) by mouth daily. 90 tablet 0   SYRINGE-NEEDLE, DISP, 3 ML 25G X 5/8" 3 ML MISC 1 each by Does not apply route every 30 (thirty) days. 50 each 0   colestipol (COLESTID) 1 g tablet TAKE ONE TABLET BY MOUTH TWICE DAILY 30 tablet 0   indapamide (LOZOL) 1.25 MG tablet Take 1.25 mg by mouth daily.     aspirin EC 81 MG tablet Take 81 mg by mouth daily. Swallow whole.     Pitavastatin Calcium 2 MG TABS Take 1 tablet (2 mg total) by mouth daily. 90 tablet 1   rizatriptan (MAXALT-MLT) 10 MG disintegrating tablet Take 1 tablet (10 mg total) by mouth as needed for migraine. May repeat in 2 hours if needed 30 tablet 1   No facility-administered medications prior to visit.    ROS Review of Systems  Constitutional:  Positive for appetite change, fatigue and unexpected weight change.  HENT:  Negative for sore throat and trouble swallowing.   Respiratory:  Negative for cough, chest tightness, shortness of breath and wheezing.   Cardiovascular:  Negative for chest pain, palpitations and leg swelling.  Gastrointestinal:  Positive for abdominal pain and diarrhea. Negative for blood in stool, constipation, nausea and vomiting.  Genitourinary:  Negative for difficulty urinating, dysuria and hematuria.  Musculoskeletal: Negative.  Negative  for arthralgias and myalgias.  Skin: Negative.   Neurological:  Positive for dizziness. Negative for weakness.  Hematological:  Negative for adenopathy. Does not bruise/bleed easily.  Psychiatric/Behavioral:  Positive for confusion and decreased concentration. Negative for sleep disturbance. The patient is not nervous/anxious.     Objective:  BP (!) 148/96 (BP Location: Left Arm, Patient Position: Sitting, Cuff Size: Normal)   Pulse 93   Temp 97.6 F (36.4 C) (Oral)   Resp 16   Ht 5\' 5"  (1.651 m)   Wt 188 lb (85.3 kg)   SpO2 98%   BMI 31.28 kg/m   BP Readings from Last 3  Encounters:  07/08/23 (!) 148/96  04/07/23 124/78  04/07/23 124/78    Wt Readings from Last 3 Encounters:  07/08/23 188 lb (85.3 kg)  04/07/23 195 lb 6.4 oz (88.6 kg)  04/07/23 195 lb 6.4 oz (88.6 kg)    Physical Exam Vitals reviewed.  Constitutional:      General: She is not in acute distress.    Appearance: Normal appearance. She is not toxic-appearing or diaphoretic.  HENT:     Nose: Nose normal.     Mouth/Throat:     Mouth: Mucous membranes are moist.  Eyes:     General: No scleral icterus.    Conjunctiva/sclera: Conjunctivae normal.  Cardiovascular:     Rate and Rhythm: Regular rhythm. Tachycardia present.     Heart sounds: Normal heart sounds, S1 normal and S2 normal. No murmur heard.    No gallop.     Comments: EKG- ST, 98 bpm Prolonged QT No LVH, Q waves, or ST/T waves  Pulmonary:     Effort: Pulmonary effort is normal.     Breath sounds: No stridor. No wheezing, rhonchi or rales.  Abdominal:     General: Abdomen is protuberant. Bowel sounds are normal. There is no distension.     Palpations: Abdomen is soft. There is no hepatomegaly, splenomegaly or mass.     Tenderness: There is no abdominal tenderness. There is no guarding or rebound.     Hernia: No hernia is present.  Musculoskeletal:     Cervical back: Neck supple.     Right lower leg: No edema.     Left lower leg: No edema.  Skin:    General: Skin is warm and dry.  Neurological:     General: No focal deficit present.     Mental Status: She is alert. Mental status is at baseline.  Psychiatric:        Mood and Affect: Mood normal.        Behavior: Behavior normal.     Lab Results  Component Value Date   WBC 9.8 07/08/2023   HGB 14.2 07/08/2023   HCT 43.2 07/08/2023   PLT 227.0 07/08/2023   GLUCOSE 106 (H) 07/08/2023   CHOL 219 (H) 01/05/2023   TRIG 148.0 01/05/2023   HDL 52.40 01/05/2023   LDLDIRECT 165.0 06/17/2020   LDLCALC 137 (H) 01/05/2023   ALT 28 07/08/2023   AST 36 07/08/2023    NA 141 07/08/2023   K 3.0 (L) 07/08/2023   CL 101 07/08/2023   CREATININE 1.73 (H) 07/08/2023   BUN 7 07/08/2023   CO2 32 07/08/2023   TSH 6.14 (H) 07/08/2023   INR 1.2 (H) 07/08/2023   HGBA1C 4.8 01/05/2023    MM 3D SCREENING MAMMOGRAM BILATERAL BREAST  Result Date: 04/29/2023 CLINICAL DATA:  Screening. EXAM: DIGITAL SCREENING BILATERAL MAMMOGRAM WITH TOMOSYNTHESIS AND CAD TECHNIQUE: Bilateral screening  digital craniocaudal and mediolateral oblique mammograms were obtained. Bilateral screening digital breast tomosynthesis was performed. The images were evaluated with computer-aided detection. COMPARISON:  Previous exam(s). ACR Breast Density Category b: There are scattered areas of fibroglandular density. FINDINGS: There are no findings suspicious for malignancy. IMPRESSION: No mammographic evidence of malignancy. A result letter of this screening mammogram will be mailed directly to the patient. RECOMMENDATION: Screening mammogram in one year. (Code:SM-B-01Y) BI-RADS CATEGORY  1: Negative. Electronically Signed   By: Elberta Fortis M.D.   On: 04/29/2023 09:58    Assessment & Plan:  Acquired hypothyroidism --  -     TSH; Future  NASH (nonalcoholic steatohepatitis) -     Hepatic function panel; Future -     Protime-INR; Future  Stage 4 chronic kidney disease (HCC) -     Urinalysis, Routine w reflex microscopic; Future -     Basic metabolic panel; Future -     AMB Referral [facility]BCI Care Management  Primary hypertension- Her BP is not at goal and she is hyperkalemic. Will discontinue the thiazide diuretic and start an alpha-blocker. -     EKG 12-Lead -     Urinalysis, Routine w reflex microscopic; Future -     CBC with Differential/Platelet; Future -     Basic metabolic panel; Future -     Terazosin HCl; Take 1 capsule (2 mg total) by mouth at bedtime.  Dispense: 90 capsule; Refill: 0 -     AMB Referral VBCI Care Management  Left lower quadrant abdominal tenderness without rebound  tenderness - Will treat for UTI. -     Lipase; Future -     Amylase; Future -     CBC with Differential/Platelet; Future  Acute cystitis without hematuria -     Sulfamethoxazole-Trimethoprim; Take 1 tablet by mouth 2 (two) times daily for 5 days.  Dispense: 10 tablet; Refill: 0  Diuretic-induced hypokalemia -     Potassium Chloride ER; Take 1 tablet (10 mEq total) by mouth daily.  Dispense: 90 tablet; Refill: 0 -     AMB Referral VBCI Care Management  Bile salt-induced diarrhea -     Colestipol HCl; Take 1 tablet (1 g total) by mouth 2 (two) times daily.  Dispense: 180 tablet; Refill: 0 -     AMB Referral VBCI Care Management  Hyperlipidemia with target LDL less than 160 -     Colestipol HCl; Take 1 tablet (1 g total) by mouth 2 (two) times daily.  Dispense: 180 tablet; Refill: 0 -     AMB Referral VBCI Care Management     Follow-up: Return in about 3 months (around 10/08/2023).  Sanda Linger, MD

## 2023-07-09 ENCOUNTER — Telehealth: Payer: Self-pay

## 2023-07-09 NOTE — Progress Notes (Signed)
Care Guide Note  07/09/2023 Name: EVANGELINA TAMBASCO MRN: [id number] DOB: Dec 31, 1945  Referred by: Etta Grandchild, MD Reason for referral : Care Coordination (Outreach to schedule with Pharm d )   HARIKA JEANPHILIPPE is a 77 y.o. year old female who is a primary care patient of Etta Grandchild, MD. [name] was referred to the pharmacist for assistance related to HTN and HLD.    An unsuccessful telephone outreach was attempted today to contact the patient who was referred to the pharmacy team for assistance with medication management. Additional attempts will be made to contact the patient.   [name], RMA Care Guide [facility]  [place], [place] [address] Direct Dial: [phone] [email or web address]

## 2023-07-19 NOTE — Progress Notes (Signed)
   Care Guide Note  07/19/2023 Name: Valerie Jackson MRN: [id number] DOB: 26-Oct-1945  Referred by: Etta Grandchild, MD Reason for referral : Care Coordination (Outreach to schedule with Pharm d )   Valerie Jackson is a 77 y.o. year old female who is a primary care patient of Etta Grandchild, MD. [name] was referred to the pharmacist for assistance related to HTN and HLD.    Successful contact was made with the patient to discuss pharmacy services including being ready for the pharmacist to call at least 5 minutes before the scheduled appointment time, to have medication bottles and any blood sugar or blood pressure readings ready for review. The patient agreed to meet with the pharmacist via with the pharmacist via telephone visit on (date/time).  07/22/2023   [name], RMA Care Guide [facility]  [place], [place] [address] Direct Dial: [phone] [email or web address]

## 2023-07-22 ENCOUNTER — Other Ambulatory Visit: Payer: Medicare Other | Admitting: Pharmacist

## 2023-07-22 VITALS — BP 104/72

## 2023-07-22 DIAGNOSIS — M109 Gout, unspecified: Secondary | ICD-10-CM

## 2023-07-22 DIAGNOSIS — E538 Deficiency of other specified B group vitamins: Secondary | ICD-10-CM

## 2023-07-22 DIAGNOSIS — I10 Essential (primary) hypertension: Secondary | ICD-10-CM

## 2023-07-22 MED ORDER — COLCHICINE 0.6 MG PO TABS: 0.6000 mg | ORAL_TABLET | ORAL | Status: DC

## 2023-07-22 MED ORDER — FOLIC ACID 1 MG PO TABS
1.0000 mg | ORAL_TABLET | Freq: Every day | ORAL | 0 refills | Status: DC
Start: 2023-07-22 — End: 2023-11-08

## 2023-07-22 NOTE — Patient Instructions (Signed)
It was a pleasure speaking with you today!  Continue taking terazosin 2 mg every night and monitoring blood pressures. Please call me if experiencing frequent dizziness.  Reduce colchicine to 0.6 mg every other day and monitor for gout flare symptoms.  Feel free to call with any questions or concerns!  Arbutus Leas, PharmD, BCPS  [facility] Clinical Pharmacist [facility] [phone]

## 2023-07-22 NOTE — Progress Notes (Signed)
07/22/2023 Name: Valerie Jackson MRN: [id number] DOB: March 02, 1946  Chief Complaint  Patient presents with   Hypertension   Gout   Medication Management    Valerie Jackson is a 77 y.o. year old female who presented for a telephone visit.   They were referred to the pharmacist by their PCP for assistance in managing complex medication management.    Subjective:  Care Team: Primary Care Provider: Etta Grandchild, MD ; Next Scheduled Visit: 10/06/2023   Medication Access/Adherence  Current Pharmacy:  [facility] [id number], [place] - [address] [place] [address] Phone: [phone] Fax: [phone]  [id number] - [place], [place] - [address] [place] [address] Phone: [phone] Fax: [phone]   Patient reports affordability concerns with their medications: No  Patient reports access/transportation concerns to their pharmacy: No  Patient reports adherence concerns with their medications:  Yes    Pt notes she was not taking indapamide or any of the other BP meds on her list.  Hypertension:  Current medications: terazosin 2 mg at bedtime Medications previously tried: indapamide, amlodipine, nebivolol, hydralazine  Patient has a validated, automated, upper arm home BP cuff Current blood pressure readings readings: BP during call was 104/72 - pt was SOB and dizzy after moving around a lot and rushing She reports her BP at home is typically 120-145/80-90  Patient reports hypotensive s/sx including dizziness, lightheadedness.  Patient denies hypertensive symptoms including headache, chest pain, shortness of breath  Gout: Current medications: allopurinol 100 mg 2 tablets daily, colchicine 0.6 mg daily  Pt had a gout flare in July - went to the ED. Allopurinol was increased in August by Dr. Allena Katz. Pt then had a smaller flare in September.   Objective: BP Readings from Last 3  Encounters:  07/22/23 104/72  07/08/23 (!) 148/96  04/07/23 124/78    Lab Results  Component Value Date   HGBA1C 4.8 01/05/2023    Lab Results  Component Value Date   CREATININE 1.73 (H) 07/08/2023   BUN 7 07/08/2023   NA 141 07/08/2023   K 3.0 (L) 07/08/2023   CL 101 07/08/2023   CO2 32 07/08/2023    Lab Results  Component Value Date   CHOL 219 (H) 01/05/2023   HDL 52.40 01/05/2023   LDLCALC 137 (H) 01/05/2023   LDLDIRECT 165.0 06/17/2020   TRIG 148.0 01/05/2023   CHOLHDL 4 01/05/2023    Medications Reviewed Today     Reviewed by Bonita Quin, RPH (Pharmacist) on 07/22/23 at 1518  Med List Status: <None>   Medication Order Taking? Sig Documenting Provider Last Dose Status Informant  allopurinol (ZYLOPRIM) 100 MG tablet [id number] Yes Take 1 tablet (100 mg total) by mouth daily.  Patient taking differently: Take 200 mg by mouth daily.   Etta Grandchild, MD Taking Active   ALPRAZolam Prudy Feeler) 0.5 MG tablet [id number] No TAKE ONE TABLET BY MOUTH AT BEDTIME AS NEEDED FOR ANXIETY  Patient not taking: Reported on 07/22/2023   Etta Grandchild, MD Not Taking Active   Biotin 30160 MCG TABS [id number] Yes Take 1 tablet by mouth daily. [provider] Taking Active Self  cholecalciferol (VITAMIN D) 1000 UNITS tablet [id number] Yes Take 1,000 Units by mouth daily. [provider] Taking Active Self  colchicine 0.6 MG tablet [id number] Yes 2 tabs po x 1, then one tab po 1 hour later  Patient taking differently:  Take 0.6 mg by mouth daily. 2 tabs po x 1, then one tab po 1 hour later   Domenick Gong, MD Taking Active   colestipol (COLESTID) 1 g tablet [id number] Yes Take 1 tablet (1 g total) by mouth 2 (two) times daily.  Patient taking differently: Take 1 g by mouth daily.   Etta Grandchild, MD Taking Active   cyanocobalamin (VITAMIN B12) 1000 MCG/ML injection [id number] Yes Inject 1 mL (1,000 mcg total) into the muscle every 30 (thirty) days. Etta Grandchild,  MD Taking Active   folic acid (FOLVITE) 1 MG tablet [id number] Yes TAKE ONE TABLET BY MOUTH ONCE A DAY Etta Grandchild, MD Taking Active   levothyroxine (SYNTHROID) 137 MCG tablet [id number] Yes TAKE ONE TABLET BY MOUTH DAILY BEFORE Drucie Opitz, MD Taking Active   potassium chloride (KLOR-CON 10) 10 MEQ tablet [id number] Yes Take 1 tablet (10 mEq total) by mouth daily. Etta Grandchild, MD Taking Active   SYRINGE-NEEDLE, DISP, 3 ML 25G X 5/8" 3 ML MISC [id number] Yes 1 each by Does not apply route every 30 (thirty) days. Etta Grandchild, MD Taking Active   terazosin (HYTRIN) 2 MG capsule [id number] Yes Take 1 capsule (2 mg total) by mouth at bedtime. Etta Grandchild, MD Taking Active               Assessment/Plan:   Hypertension: - Currently controlled, BP goal <130/80. - Removed other HTN medications from her list that she has not been taking. - Recommended to continue terazosin and monitor BP. Notify us if having dizziness/weakness more often.  Gout: -Currently controlled. - Flare in September possibly related to recently increased allopurinol dose.  - Due to renal function (GFR 27), recommend decreasing colchicine to 0.6 mg every other day. - Recommend updating uric acid level   *needs folic acid refill  Follow Up Plan: 12/5 4 week follow up  Arbutus Leas, PharmD, BCPS Clinical Pharmacist [facility] [phone]

## 2023-08-14 ENCOUNTER — Other Ambulatory Visit: Payer: Self-pay | Admitting: Internal Medicine

## 2023-08-14 DIAGNOSIS — I10 Essential (primary) hypertension: Secondary | ICD-10-CM

## 2023-08-17 ENCOUNTER — Other Ambulatory Visit: Payer: Self-pay | Admitting: Internal Medicine

## 2023-08-17 DIAGNOSIS — I10 Essential (primary) hypertension: Secondary | ICD-10-CM

## 2023-08-19 ENCOUNTER — Other Ambulatory Visit: Payer: Medicare Other | Admitting: Pharmacist

## 2023-08-19 DIAGNOSIS — M1A09X0 Idiopathic chronic gout, multiple sites, without tophus (tophi): Secondary | ICD-10-CM

## 2023-08-19 DIAGNOSIS — I10 Essential (primary) hypertension: Secondary | ICD-10-CM

## 2023-08-19 NOTE — Progress Notes (Signed)
   08/19/2023 Name: Valerie Jackson MRN: [id number] DOB: 1946/02/23  Chief Complaint  Patient presents with   Medication Management    Valerie Jackson is a 77 y.o. year old female who presented for a telephone visit.   They were referred to the pharmacist by their PCP for assistance in managing complex medication management.    Subjective:  Care Team: Primary Care Provider: Etta Grandchild, MD ; Next Scheduled Visit: 10/06/2023   Medication Access/Adherence  Current Pharmacy:  [facility] [address], [place] - [address] [place] [address] Phone: [phone] Fax: [phone]  [id number] - [place], [place] - [address] [place] [address] Phone: [phone] Fax: [phone]   Patient reports affordability concerns with their medications: No  Patient reports access/transportation concerns to their pharmacy: No  Patient reports adherence concerns with their medications:  Yes     Hypertension:  Current medications: terazosin 2 mg at bedtime (does not appear she has been taking - has history of self d/c BP meds) Medications previously tried: indapamide, amlodipine, nebivolol, hydralazine  Patient has a validated, automated, upper arm home BP cuff Current blood pressure readings readings:  She reports her BP at home is typically 130-140s/80-86  Patient denies hypotensive s/sx including dizziness, lightheadedness.  Patient denies hypertensive symptoms including headache, chest pain, shortness of breath  Gout: Current medications: allopurinol 100 mg 1 tablet daily (previous appt she reported 2 tabs daily), colchicine 0.6 mg daily (had previously recommended every other day due to renal function)  Pt notes about a week ago she had a gout flare and she tried getting an appt with PCP office but unsure why appt wasn't made. She notes gout flare resolved on its own after she applied ice,  elevated leg, and increased her colchicine back to daily.   Objective: BP Readings from Last 3 Encounters:  07/22/23 104/72  07/08/23 (!) 148/96  04/07/23 124/78    Lab Results  Component Value Date   HGBA1C 4.8 01/05/2023    Lab Results  Component Value Date   CREATININE 1.73 (H) 07/08/2023   BUN 7 07/08/2023   NA 141 07/08/2023   K 3.0 (L) 07/08/2023   CL 101 07/08/2023   CO2 32 07/08/2023    Lab Results  Component Value Date   CHOL 219 (H) 01/05/2023   HDL 52.40 01/05/2023   LDLCALC 137 (H) 01/05/2023   LDLDIRECT 165.0 06/17/2020   TRIG 148.0 01/05/2023   CHOLHDL 4 01/05/2023    Medications Reviewed Today   Medications were not reviewed in this encounter       Assessment/Plan:   Hypertension: - Currently uncontrolled, BP goal <130/80. - Continue monitoring BP and continue terazosin  Gout: - Recommend increasing allopurinol back to 200 mg daily as that is how Dr. Allena Katz prescribed. - Due to renal function (GFR 27), recommend decreasing colchicine to 0.6 mg every other day. She plans to continue daily colchicine and recommended she discuss with Dr. Allena Katz at her appt in a couple of days regarding colchicine dosing and recent gout flare - Recommend updating uric acid level   Follow Up Plan: PRN  Arbutus Leas, PharmD, BCPS Clinical Pharmacist [facility] [phone]

## 2023-08-25 ENCOUNTER — Other Ambulatory Visit: Payer: Self-pay | Admitting: Internal Medicine

## 2023-08-25 DIAGNOSIS — E039 Hypothyroidism, unspecified: Secondary | ICD-10-CM

## 2023-08-25 DIAGNOSIS — I10 Essential (primary) hypertension: Secondary | ICD-10-CM

## 2023-08-25 MED ORDER — LEVOTHYROXINE SODIUM 137 MCG PO TABS
137.0000 ug | ORAL_TABLET | Freq: Every day | ORAL | 0 refills | Status: DC
Start: 2023-08-25 — End: 2023-10-18

## 2023-08-31 ENCOUNTER — Other Ambulatory Visit: Payer: Self-pay | Admitting: Internal Medicine

## 2023-08-31 DIAGNOSIS — I10 Essential (primary) hypertension: Secondary | ICD-10-CM

## 2023-09-03 DIAGNOSIS — N390 Urinary tract infection, site not specified: Secondary | ICD-10-CM | POA: Diagnosis not present

## 2023-09-03 DIAGNOSIS — N1832 Chronic kidney disease, stage 3b: Secondary | ICD-10-CM | POA: Diagnosis not present

## 2023-09-06 DIAGNOSIS — N189 Chronic kidney disease, unspecified: Secondary | ICD-10-CM | POA: Diagnosis not present

## 2023-09-06 DIAGNOSIS — D631 Anemia in chronic kidney disease: Secondary | ICD-10-CM | POA: Diagnosis not present

## 2023-09-06 DIAGNOSIS — I129 Hypertensive chronic kidney disease with stage 1 through stage 4 chronic kidney disease, or unspecified chronic kidney disease: Secondary | ICD-10-CM | POA: Diagnosis not present

## 2023-09-06 DIAGNOSIS — E559 Vitamin D deficiency, unspecified: Secondary | ICD-10-CM | POA: Diagnosis not present

## 2023-09-06 DIAGNOSIS — N1832 Chronic kidney disease, stage 3b: Secondary | ICD-10-CM | POA: Diagnosis not present

## 2023-10-06 ENCOUNTER — Ambulatory Visit: Payer: Medicare Other | Admitting: Internal Medicine

## 2023-10-06 ENCOUNTER — Encounter: Payer: Self-pay | Admitting: Internal Medicine

## 2023-10-06 VITALS — BP 122/84 | HR 93 | Temp 97.5°F | Ht 65.0 in | Wt 192.6 lb

## 2023-10-06 DIAGNOSIS — E039 Hypothyroidism, unspecified: Secondary | ICD-10-CM | POA: Diagnosis not present

## 2023-10-06 DIAGNOSIS — N184 Chronic kidney disease, stage 4 (severe): Secondary | ICD-10-CM | POA: Diagnosis not present

## 2023-10-06 DIAGNOSIS — B372 Candidiasis of skin and nail: Secondary | ICD-10-CM | POA: Insufficient documentation

## 2023-10-06 DIAGNOSIS — I10 Essential (primary) hypertension: Secondary | ICD-10-CM

## 2023-10-06 LAB — BASIC METABOLIC PANEL
BUN: 8 mg/dL (ref 6–23)
CO2: 28 meq/L (ref 19–32)
Calcium: 10.4 mg/dL (ref 8.4–10.5)
Chloride: 105 meq/L (ref 96–112)
Creatinine, Ser: 1.61 mg/dL — ABNORMAL HIGH (ref 0.40–1.20)
GFR: 30.45 mL/min — ABNORMAL LOW (ref >60.00)
Glucose, Bld: 103 mg/dL — ABNORMAL HIGH (ref 70–99)
Potassium: 3.8 meq/L (ref 3.5–5.1)
Sodium: 142 meq/L (ref 135–145)

## 2023-10-06 LAB — CBC WITH DIFFERENTIAL/PLATELET
Basophils Absolute: 0.1 10*3/uL (ref 0.0–0.1)
Basophils Relative: 1.3 % (ref 0.0–3.0)
Eosinophils Absolute: 0.8 10*3/uL — ABNORMAL HIGH (ref 0.0–0.7)
Eosinophils Relative: 10.2 % — ABNORMAL HIGH (ref 0.0–5.0)
HCT: 41.7 % (ref 36.0–46.0)
Hemoglobin: 13.8 g/dL (ref 12.0–15.0)
Lymphocytes Relative: 27.3 % (ref 12.0–46.0)
Lymphs Abs: 2.1 10*3/uL (ref 0.7–4.0)
MCHC: 33.1 g/dL (ref 30.0–36.0)
MCV: 96.4 fL (ref 78.0–100.0)
Monocytes Absolute: 0.5 10*3/uL (ref 0.1–1.0)
Monocytes Relative: 6.7 % (ref 3.0–12.0)
Neutro Abs: 4.2 10*3/uL (ref 1.4–7.7)
Neutrophils Relative %: 54.5 % (ref 43.0–77.0)
Platelets: 195 10*3/uL (ref 150.0–400.0)
RBC: 4.32 Mil/uL (ref 3.87–5.11)
RDW: 12.8 % (ref 11.5–15.5)
WBC: 7.7 10*3/uL (ref 4.0–10.5)

## 2023-10-06 LAB — TSH: TSH: 2.22 u[IU]/mL (ref 0.35–5.50)

## 2023-10-06 MED ORDER — FLUCONAZOLE 50 MG PO TABS
50.0000 mg | ORAL_TABLET | Freq: Every day | ORAL | 1 refills | Status: AC
Start: 2023-10-06 — End: 2023-10-13

## 2023-10-06 NOTE — Patient Instructions (Signed)
Skin Yeast Infection  A skin yeast infection is a condition in which there is an overgrowth of yeast (Candida) that normally lives on the skin. This condition usually occurs in areas of the skin that are constantly warm and moist, such as the skin under the breasts or armpits, or in the groin and other body folds. What are the causes? This condition is caused by a change in the normal balance of the yeast that live on the skin. What increases the risk? You are more likely to develop this condition if you: Are obese. Are pregnant. Are 44 years of age or older. Wear tight clothing. Have any of the following conditions: Diabetes. Malnutrition. A weak body defense system (immune system). Take medicines such as: Birth control pills. Antibiotics. Steroid medicines. What are the signs or symptoms? The most common symptom of this condition is itchiness in the affected area. Other symptoms include: A red, swollen area of the skin. Bumps on the skin. How is this diagnosed? This condition is diagnosed with a medical history and physical exam. Your health care provider may check for yeast by taking scrapings of the skin to be viewed under a microscope. How is this treated? This condition is treated with medicine. Medicines may be prescribed or available over the counter. The medicines may be: Taken by mouth (orally). Applied as a cream or powder to your skin. Follow these instructions at home:  Take or apply over-the-counter and prescription medicines only as told by your health care provider. Maintain a healthy weight. If you need help losing weight, talk with your health care provider. Keep your skin clean and dry. Wear loose-fitting clothing. If you have diabetes, keep your blood sugar under control. Keep all follow-up visits. This is important. Contact a health care provider if: Your symptoms go away and then come back. Your symptoms do not get better with treatment. Your symptoms get  worse. Your rash spreads. You have a fever or chills. You have new symptoms. You have new warmth or redness of your skin. Your rash is painful or bleeding. Summary A skin yeast infection is a condition in which there is an overgrowth of yeast (Candida) that normally lives on the skin. Take or apply over-the-counter and prescription medicines only as told by your health care provider. Keep your skin clean and dry. Contact a health care provider if your symptoms do not get better with treatment. This information is not intended to replace advice given to you by your health care provider. Make sure you discuss any questions you have with your health care provider. Document Revised: 11/19/2020 Document Reviewed: 11/19/2020 Elsevier Patient Education  2024 [employer].

## 2023-10-06 NOTE — Progress Notes (Signed)
Subjective:  Patient ID: Valerie Jackson, female    DOB: 04/08/1946  Age: 78 y.o. MRN: [id number]  CC: Rash and Hypothyroidism   HPI [place] presents for f/up ----  Discussed the use of AI scribe software for clinical note transcription with the patient, who gave verbal consent to proceed.  History of Present Illness   The patient presents with a persistent rash under the breasts and in the groin area, which has been present for approximately nine months. Initially, the rash was seasonal, appearing only in the summer months, but it has since become a year-round issue. The patient describes the rash as burning at times and has attempted various treatments, including an antifungal cream used for athlete's foot and a deodorant paste. Despite these efforts, the rash has not improved and has begun to spread to the groin area, causing occasional itchiness.  In addition to the rash, the patient reports persistent fatigue, a consistently runny nose with occasional small bleeds, and worsening blurry vision. The blurry vision has become severe enough to affect the patient's ability to drive. The patient denies experiencing chest pain, shortness of breath, nausea, or vomiting. The patient is scheduled to see an ophthalmologist for the vision issues.       Outpatient Medications Prior to Visit  Medication Sig Dispense Refill   allopurinol (ZYLOPRIM) 100 MG tablet Take 1 tablet (100 mg total) by mouth daily. 90 tablet 1   ALPRAZolam (XANAX) 0.5 MG tablet TAKE ONE TABLET BY MOUTH AT BEDTIME AS NEEDED FOR ANXIETY 30 tablet 2   Biotin 40981 MCG TABS Take 1 tablet by mouth daily.     cholecalciferol (VITAMIN D) 1000 UNITS tablet Take 1,000 Units by mouth daily.     colchicine 0.6 MG tablet Take 1 tablet (0.6 mg total) by mouth every other day. 2 tabs po x 1, then one tab po 1 hour later (Patient taking differently: Take 0.6 mg by mouth daily. 2 tabs po x 1, then one tab po 1 hour later)      colestipol (COLESTID) 1 g tablet Take 1 tablet (1 g total) by mouth 2 (two) times daily. (Patient taking differently: Take 1 g by mouth daily.) 180 tablet 0   cyanocobalamin (VITAMIN B12) 1000 MCG/ML injection Inject 1 mL (1,000 mcg total) into the muscle every 30 (thirty) days. 4 mL 3   folic acid (FOLVITE) 1 MG tablet Take 1 tablet (1 mg total) by mouth daily. 90 tablet 0   levothyroxine (SYNTHROID) 137 MCG tablet Take 1 tablet (137 mcg total) by mouth daily before breakfast. 90 tablet 0   potassium chloride (KLOR-CON 10) 10 MEQ tablet Take 1 tablet (10 mEq total) by mouth daily. 90 tablet 0   SYRINGE-NEEDLE, DISP, 3 ML 25G X 5/8" 3 ML MISC 1 each by Does not apply route every 30 (thirty) days. 50 each 0   terazosin (HYTRIN) 2 MG capsule Take 1 capsule (2 mg total) by mouth at bedtime. 90 capsule 0   No facility-administered medications prior to visit.    ROS Review of Systems  Constitutional:  Positive for fatigue. Negative for appetite change, chills, diaphoresis and unexpected weight change.  HENT:  Positive for nosebleeds. Negative for facial swelling, sinus pressure, sore throat and trouble swallowing.   Eyes:  Positive for visual disturbance.  Respiratory:  Negative for cough, chest tightness, shortness of breath and wheezing.   Cardiovascular:  Negative for chest pain, palpitations and leg swelling.  Gastrointestinal:  Negative for  abdominal pain, constipation, diarrhea and vomiting.  Endocrine: Negative.   Genitourinary:  Negative for difficulty urinating and dysuria.  Musculoskeletal: Negative.  Negative for arthralgias and myalgias.  Skin:  Positive for color change and rash.  Neurological: Negative.  Negative for dizziness and weakness.  Hematological:  Negative for adenopathy. Does not bruise/bleed easily.    Objective:  BP 122/84 (BP Location: Left Arm, Patient Position: Sitting, Cuff Size: Normal)   Pulse 93   Temp (!) 97.5 F (36.4 C) (Oral)   Ht 5\' 5"  (1.651 m)   Wt  192 lb 9.6 oz (87.4 kg)   SpO2 99%   BMI 32.05 kg/m   BP Readings from Last 3 Encounters:  10/06/23 122/84  07/22/23 104/72  07/08/23 (!) 148/96    Wt Readings from Last 3 Encounters:  10/06/23 192 lb 9.6 oz (87.4 kg)  07/08/23 188 lb (85.3 kg)  04/07/23 195 lb 6.4 oz (88.6 kg)    Physical Exam Vitals reviewed.  Constitutional:      Appearance: Normal appearance.  HENT:     Nose: Nose normal.     Right Nostril: No epistaxis.     Left Nostril: No epistaxis.     Mouth/Throat:     Mouth: Mucous membranes are moist.  Eyes:     General: No scleral icterus.    Conjunctiva/sclera: Conjunctivae normal.  Cardiovascular:     Rate and Rhythm: Normal rate and regular rhythm.     Heart sounds: No murmur heard.    No friction rub. No gallop.  Pulmonary:     Effort: Pulmonary effort is normal.     Breath sounds: No stridor. No wheezing, rhonchi or rales.  Chest:    Musculoskeletal:     Cervical back: Neck supple.  Lymphadenopathy:     Cervical: No cervical adenopathy.  Neurological:     Mental Status: She is alert.     Lab Results  Component Value Date   WBC 7.7 10/06/2023   HGB 13.8 10/06/2023   HCT 41.7 10/06/2023   PLT 195.0 10/06/2023   GLUCOSE 103 (H) 10/06/2023   CHOL 219 (H) 01/05/2023   TRIG 148.0 01/05/2023   HDL 52.40 01/05/2023   LDLDIRECT 165.0 06/17/2020   LDLCALC 137 (H) 01/05/2023   ALT 28 07/08/2023   AST 36 07/08/2023   NA 142 10/06/2023   K 3.8 10/06/2023   CL 105 10/06/2023   CREATININE 1.61 (H) 10/06/2023   BUN 8 10/06/2023   CO2 28 10/06/2023   TSH 2.22 10/06/2023   INR 1.2 (H) 07/08/2023   HGBA1C 4.8 01/05/2023    MM 3D SCREENING MAMMOGRAM BILATERAL BREAST Result Date: 04/29/2023 CLINICAL DATA:  Screening. EXAM: DIGITAL SCREENING BILATERAL MAMMOGRAM WITH TOMOSYNTHESIS AND CAD TECHNIQUE: Bilateral screening digital craniocaudal and mediolateral oblique mammograms were obtained. Bilateral screening digital breast tomosynthesis was  performed. The images were evaluated with computer-aided detection. COMPARISON:  Previous exam(s). ACR Breast Density Category b: There are scattered areas of fibroglandular density. FINDINGS: There are no findings suspicious for malignancy. IMPRESSION: No mammographic evidence of malignancy. A result letter of this screening mammogram will be mailed directly to the patient. RECOMMENDATION: Screening mammogram in one year. (Code:SM-B-01Y) BI-RADS CATEGORY  1: Negative. Electronically Signed   By: Elberta Fortis M.D.   On: 04/29/2023 09:58    Assessment & Plan:   Primary hypertension- Her blood pressure is well-controlled. -     Basic metabolic panel; Future -     CBC with Differential/Platelet; Future -  TSH; Future  Acquired hypothyroidism- She is euthyroid. -     CBC with Differential/Platelet; Future -     TSH; Future  Stage 4 chronic kidney disease (HCC) -     Basic metabolic panel; Future -     CBC with Differential/Platelet; Future  Candidal skin infection -     Fluconazole; Take 1 tablet (50 mg total) by mouth daily for 7 days.  Dispense: 7 tablet; Refill: 1     Follow-up: Return if symptoms worsen or fail to improve.  Sanda Linger, MD

## 2023-10-08 DIAGNOSIS — H04123 Dry eye syndrome of bilateral lacrimal glands: Secondary | ICD-10-CM | POA: Diagnosis not present

## 2023-10-08 DIAGNOSIS — H52203 Unspecified astigmatism, bilateral: Secondary | ICD-10-CM | POA: Diagnosis not present

## 2023-10-08 DIAGNOSIS — H43813 Vitreous degeneration, bilateral: Secondary | ICD-10-CM | POA: Diagnosis not present

## 2023-10-08 DIAGNOSIS — H26493 Other secondary cataract, bilateral: Secondary | ICD-10-CM | POA: Diagnosis not present

## 2023-10-08 DIAGNOSIS — H524 Presbyopia: Secondary | ICD-10-CM | POA: Diagnosis not present

## 2023-10-16 ENCOUNTER — Encounter: Payer: Self-pay | Admitting: Internal Medicine

## 2023-10-18 ENCOUNTER — Other Ambulatory Visit: Payer: Self-pay | Admitting: Internal Medicine

## 2023-10-18 DIAGNOSIS — E039 Hypothyroidism, unspecified: Secondary | ICD-10-CM

## 2023-10-18 DIAGNOSIS — T502X5A Adverse effect of carbonic-anhydrase inhibitors, benzothiadiazides and other diuretics, initial encounter: Secondary | ICD-10-CM

## 2023-10-19 DIAGNOSIS — H26492 Other secondary cataract, left eye: Secondary | ICD-10-CM | POA: Diagnosis not present

## 2023-11-08 ENCOUNTER — Other Ambulatory Visit: Payer: Self-pay | Admitting: Internal Medicine

## 2023-11-08 DIAGNOSIS — E538 Deficiency of other specified B group vitamins: Secondary | ICD-10-CM

## 2023-11-08 DIAGNOSIS — M109 Gout, unspecified: Secondary | ICD-10-CM

## 2023-11-09 ENCOUNTER — Other Ambulatory Visit: Payer: Self-pay | Admitting: Internal Medicine

## 2023-11-09 DIAGNOSIS — M109 Gout, unspecified: Secondary | ICD-10-CM

## 2023-11-09 MED ORDER — FOLIC ACID 1 MG PO TABS: 1.0000 mg | ORAL_TABLET | Freq: Every day | ORAL | 0 refills | Status: DC

## 2023-11-09 MED ORDER — COLCHICINE 0.6 MG PO TABS
0.6000 mg | ORAL_TABLET | Freq: Every day | ORAL | 0 refills | Status: DC
Start: 2023-11-09 — End: 2023-11-11

## 2023-11-10 ENCOUNTER — Telehealth: Payer: Self-pay | Admitting: Internal Medicine

## 2023-11-10 NOTE — Telephone Encounter (Signed)
 Copied from CRM [id number]. Topic: Clinical - Prescription Issue >> Nov 09, 2023  4:40 PM Sim Boast F wrote: Reason for CRM: [facility] calling to get claification on the Colchicine 0.6 MG tablet that was sent to the pharmacy today

## 2023-11-11 ENCOUNTER — Other Ambulatory Visit: Payer: Self-pay | Admitting: Internal Medicine

## 2023-11-11 DIAGNOSIS — M109 Gout, unspecified: Secondary | ICD-10-CM

## 2023-11-11 MED ORDER — COLCHICINE 0.6 MG PO TABS: 0.6000 mg | ORAL_TABLET | Freq: Every day | ORAL | 0 refills | Status: DC

## 2023-11-11 NOTE — Telephone Encounter (Signed)
 May you please clarify the rx you sent on the 25th so I can call them back. I read it myself and I am perplexed.

## 2023-11-17 ENCOUNTER — Other Ambulatory Visit: Payer: Self-pay | Admitting: Internal Medicine

## 2023-11-17 DIAGNOSIS — E538 Deficiency of other specified B group vitamins: Secondary | ICD-10-CM

## 2023-11-23 DIAGNOSIS — H26491 Other secondary cataract, right eye: Secondary | ICD-10-CM | POA: Diagnosis not present

## 2023-11-30 ENCOUNTER — Other Ambulatory Visit: Payer: Self-pay

## 2023-11-30 ENCOUNTER — Telehealth: Payer: Self-pay

## 2023-11-30 DIAGNOSIS — M810 Age-related osteoporosis without current pathological fracture: Secondary | ICD-10-CM

## 2023-11-30 MED ORDER — DENOSUMAB 60 MG/ML ~~LOC~~ SOSY
60.0000 mg | PREFILLED_SYRINGE | Freq: Once | SUBCUTANEOUS | Status: DC
Start: 2023-12-14 — End: 2024-06-09

## 2023-11-30 NOTE — Telephone Encounter (Signed)
 Called and spoke with patient. Informed them of potential OOP cost with prolia. Patient expressed understanding and I will be following up with them after we receive prior authorization.

## 2023-12-24 DIAGNOSIS — N1832 Chronic kidney disease, stage 3b: Secondary | ICD-10-CM | POA: Diagnosis not present

## 2023-12-24 DIAGNOSIS — N390 Urinary tract infection, site not specified: Secondary | ICD-10-CM | POA: Diagnosis not present

## 2023-12-31 ENCOUNTER — Telehealth: Payer: Self-pay

## 2023-12-31 NOTE — Telephone Encounter (Signed)
 Pt ready for scheduling for PROLIA  on or after : 12/31/23  Option# 1: Buy/Bill (Office supplied medication)  Out-of-pocket cost due at time of clinic visit: $0  Number of injection/visits approved: ---  Primary: MEDICARE Prolia  co-insurance: 0% Admin fee co-insurance: 0%  Secondary: [employer] Prolia  co-insurance:  Admin fee co-insurance:   Medical Benefit Details: Date Benefits were checked: 12/31/23 Deductible: $257 Met of $257 Required/ Coinsurance: 0%/ Admin Fee: 0%  Prior Auth: N/A PA# Expiration Date:   # of doses approved: ----------------------------------------------------------------------- Option# 2- Med Obtained from pharmacy:  Pharmacy benefit: Copay $--- (Paid to pharmacy) Admin Fee: --- (Pay at clinic)  Prior Auth: N/A PA# Expiration Date:   # of doses approved:   If patient wants fill through the pharmacy benefit please send prescription to:  --- , and include estimated need by date in rx notes. Pharmacy will ship medication directly to the office.  Patient NOT eligible for Prolia  Copay Card. Copay Card can make patient's cost as little as $25. Link to apply: [email or web address]://www.amgensupportplus.[email or web address]/copay  ** This summary of benefits is an estimation of the patient's out-of-pocket cost. Exact cost may very based on individual plan coverage.

## 2023-12-31 NOTE — Telephone Encounter (Signed)
 Valerie Jackson

## 2023-12-31 NOTE — Telephone Encounter (Signed)
 Prolia VOB initiated via [email or web address]  Next Prolia inj DUE: NOW

## 2024-01-12 ENCOUNTER — Other Ambulatory Visit: Payer: Self-pay | Admitting: Internal Medicine

## 2024-01-12 DIAGNOSIS — T502X5A Adverse effect of carbonic-anhydrase inhibitors, benzothiadiazides and other diuretics, initial encounter: Secondary | ICD-10-CM

## 2024-01-21 ENCOUNTER — Other Ambulatory Visit: Payer: Self-pay | Admitting: Internal Medicine

## 2024-01-21 DIAGNOSIS — E785 Hyperlipidemia, unspecified: Secondary | ICD-10-CM

## 2024-01-21 DIAGNOSIS — E876 Hypokalemia: Secondary | ICD-10-CM

## 2024-01-21 DIAGNOSIS — K9089 Other intestinal malabsorption: Secondary | ICD-10-CM

## 2024-01-21 DIAGNOSIS — M109 Gout, unspecified: Secondary | ICD-10-CM

## 2024-01-21 NOTE — Telephone Encounter (Signed)
 Copied from CRM [id number]. Topic: Clinical - Prescription Issue >> Jan 21, 2024  3:53 PM [place] E wrote: Reason for CRM: Patient stated she has been going back and forth with her Pharmacy ([facility]), and she is trying to get a refill for her prescriptions: colestipol  (COLESTID ) 1 g tablet, colchicine  0.6 MG tablet, and potassium chloride  (KLOR-CON ) 10 MEQ tablet. Patient states [facility] has been trying to contact PCP's office and has not heard anything about the status of these medications. Callback number for patient is [phone].

## 2024-01-25 ENCOUNTER — Other Ambulatory Visit: Payer: Self-pay

## 2024-01-26 MED ORDER — COLCHICINE 0.6 MG PO TABS: 0.6000 mg | ORAL_TABLET | Freq: Every day | ORAL | 0 refills | Status: DC

## 2024-01-31 ENCOUNTER — Other Ambulatory Visit: Payer: Self-pay | Admitting: Internal Medicine

## 2024-01-31 DIAGNOSIS — E876 Hypokalemia: Secondary | ICD-10-CM

## 2024-02-24 ENCOUNTER — Encounter: Payer: Self-pay | Admitting: Internal Medicine

## 2024-02-24 ENCOUNTER — Ambulatory Visit (INDEPENDENT_AMBULATORY_CARE_PROVIDER_SITE_OTHER): Admitting: Internal Medicine

## 2024-02-24 VITALS — BP 132/86 | HR 73 | Temp 98.2°F | Ht 65.0 in | Wt 173.8 lb

## 2024-02-24 DIAGNOSIS — R7989 Other specified abnormal findings of blood chemistry: Secondary | ICD-10-CM | POA: Diagnosis not present

## 2024-02-24 DIAGNOSIS — R10816 Epigastric abdominal tenderness: Secondary | ICD-10-CM | POA: Insufficient documentation

## 2024-02-24 DIAGNOSIS — E538 Deficiency of other specified B group vitamins: Secondary | ICD-10-CM | POA: Diagnosis not present

## 2024-02-24 DIAGNOSIS — E039 Hypothyroidism, unspecified: Secondary | ICD-10-CM | POA: Diagnosis not present

## 2024-02-24 DIAGNOSIS — I10 Essential (primary) hypertension: Secondary | ICD-10-CM | POA: Diagnosis not present

## 2024-02-24 DIAGNOSIS — R634 Abnormal weight loss: Secondary | ICD-10-CM | POA: Insufficient documentation

## 2024-02-24 DIAGNOSIS — K7581 Nonalcoholic steatohepatitis (NASH): Secondary | ICD-10-CM

## 2024-02-24 DIAGNOSIS — L233 Allergic contact dermatitis due to drugs in contact with skin: Secondary | ICD-10-CM

## 2024-02-24 DIAGNOSIS — N184 Chronic kidney disease, stage 4 (severe): Secondary | ICD-10-CM | POA: Diagnosis not present

## 2024-02-24 DIAGNOSIS — B372 Candidiasis of skin and nail: Secondary | ICD-10-CM | POA: Diagnosis not present

## 2024-02-24 LAB — URINALYSIS, ROUTINE W REFLEX MICROSCOPIC
Bilirubin Urine: NEGATIVE
Hgb urine dipstick: NEGATIVE
Ketones, ur: NEGATIVE
Nitrite: NEGATIVE
RBC / HPF: NONE SEEN (ref 0–?)
Specific Gravity, Urine: 1.015 (ref 1.000–1.030)
Urine Glucose: NEGATIVE
Urobilinogen, UA: 0.2 (ref 0.0–1.0)
pH: 6 (ref 5.0–8.0)

## 2024-02-24 NOTE — Progress Notes (Signed)
 Subjective:  Patient ID: Valerie Jackson, female    DOB: March 19, 1946  Age: 78 y.o. MRN: [id number]  CC: Weight Loss (Unexplainable. Lack of appetite, not drinking fluids like she should. ), Fatigue, Nausea, and Abdominal Pain   HPI Valerie Jackson presents for f/up ----  Discussed the use of AI scribe software for clinical note transcription with the patient, who gave verbal consent to proceed.  History of Present Illness   Valerie Jackson is a 78 year old female who presents with weight loss and skin rash.  She has experienced a weight loss of twenty pounds, attributed to a loss of appetite. She has no early satiety or odynophagia. She experiences nausea without a clear cause and occasional vomiting, though there is not much in her stomach when this occurs. No chest pain or significant abdominal pain, but she mentions recurring discomfort in the left lower abdomen since a previous surgery. Bowel movements are reduced due to decreased food intake, with occasional loose stools but no constipation or hematochezia. She is not currently taking any medications for these symptoms.  She describes a persistent and intense skin rash located under her breasts, which has not resolved despite trying three different over-the-counter antifungal creams, including Vagisil and a cream for athlete's foot. The rash waxes and wanes in severity, sometimes becoming so intense that she feels the urge to 'rip the skin off'. She uses Benadryl  spray for relief, applying it and then waiting before washing and reapplying creams.       Outpatient Medications Prior to Visit  Medication Sig Dispense Refill   allopurinol  (ZYLOPRIM ) 100 MG tablet Take 1 tablet (100 mg total) by mouth daily. 90 tablet 1   ALPRAZolam  (XANAX ) 0.5 MG tablet TAKE ONE TABLET BY MOUTH AT BEDTIME AS NEEDED FOR ANXIETY 30 tablet 2   Biotin 10000 MCG TABS Take 1 tablet by mouth daily.     cholecalciferol (VITAMIN D ) 1000 UNITS tablet Take 1,000 Units  by mouth daily.     colchicine  0.6 MG tablet Take 1 tablet (0.6 mg total) by mouth daily. 90 tablet 0   colestipol  (COLESTID ) 1 g tablet Take 1 tablet (1 g total) by mouth 2 (two) times daily. 180 tablet 0   cyanocobalamin  (VITAMIN B12) 1000 MCG/ML injection INJECT 1 ML INTO THE MUSCLE EVERY 30 DAYS 4 mL 0   folic acid  (FOLVITE ) 1 MG tablet Take 1 tablet (1 mg total) by mouth daily. 90 tablet 0   potassium chloride  (KLOR-CON ) 10 MEQ tablet Take 1 tablet (10 mEq total) by mouth daily. 90 tablet 1   SYRINGE-NEEDLE, DISP, 3 ML 25G X 5/8 3 ML MISC 1 each by Does not apply route every 30 (thirty) days. 50 each 0   terazosin  (HYTRIN ) 2 MG capsule Take 1 capsule (2 mg total) by mouth at bedtime. 90 capsule 0   levothyroxine  (SYNTHROID ) 137 MCG tablet TAKE ONE TABLET BY MOUTH ONCE A DAY BEFORE BREAKFAST 90 tablet 0   Facility-Administered Medications Prior to Visit  Medication Dose Route Frequency Provider Last Rate Last Admin   denosumab  (PROLIA ) injection 60 mg  60 mg Subcutaneous Once Arcadio Knuckles, MD        ROS Review of Systems  Constitutional:  Positive for appetite change, fatigue and unexpected weight change. Negative for chills and diaphoresis.  HENT: Negative.  Negative for trouble swallowing and voice change.   Eyes:  Negative for visual disturbance.  Respiratory:  Negative for cough, chest tightness, shortness of  breath and wheezing.   Cardiovascular:  Negative for chest pain, palpitations and leg swelling.  Gastrointestinal:  Positive for abdominal pain and nausea. Negative for anal bleeding, blood in stool, constipation, diarrhea and vomiting.  Genitourinary: Negative.  Negative for difficulty urinating, dysuria and hematuria.  Musculoskeletal:  Positive for arthralgias. Negative for myalgias.  Skin:  Positive for color change and rash. Negative for pallor and wound.  Neurological: Negative.  Negative for dizziness, weakness and light-headedness.  Hematological:  Negative for  adenopathy. Does not bruise/bleed easily.  Psychiatric/Behavioral: Negative.      Objective:  BP 132/86 (BP Location: Left Arm, Patient Position: Sitting, Cuff Size: Normal)   Pulse 73   Temp 98.2 F (36.8 C) (Oral)   Ht 5' 5 (1.651 m)   Wt 173 lb 12.8 oz (78.8 kg)   SpO2 95%   BMI 28.92 kg/m   BP Readings from Last 3 Encounters:  02/24/24 132/86  10/06/23 122/84  07/22/23 104/72    Wt Readings from Last 3 Encounters:  02/24/24 173 lb 12.8 oz (78.8 kg)  10/06/23 192 lb 9.6 oz (87.4 kg)  07/08/23 188 lb (85.3 kg)    Physical Exam Vitals reviewed. Exam conducted with a chaperone present Roxan Copes).  Constitutional:      General: She is not in acute distress.    Appearance: She is well-developed. She is not ill-appearing, toxic-appearing or diaphoretic.  HENT:     Nose: Nose normal.     Mouth/Throat:     Mouth: Mucous membranes are moist.   Eyes:     General: No scleral icterus.   Cardiovascular:     Rate and Rhythm: Normal rate and regular rhythm.     Heart sounds: No murmur heard.    No friction rub. No gallop.  Pulmonary:     Effort: Pulmonary effort is normal.     Breath sounds: No stridor. No wheezing, rhonchi or rales.  Abdominal:     General: Abdomen is flat. Bowel sounds are normal. There is no distension.     Palpations: Abdomen is soft. There is no hepatomegaly, splenomegaly or mass.     Tenderness: There is abdominal tenderness in the epigastric area. There is no guarding or rebound.     Hernia: No hernia is present.  Genitourinary:    Rectum: Normal. Guaiac result negative. No mass, tenderness, anal fissure, external hemorrhoid or internal hemorrhoid. Normal anal tone.   Musculoskeletal:        General: Normal range of motion.     Cervical back: Neck supple.     Right lower leg: No edema.     Left lower leg: No edema.  Lymphadenopathy:     Cervical: No cervical adenopathy.   Skin:    General: Skin is warm and dry.     Findings: Erythema and  rash present.   Neurological:     General: No focal deficit present.     Mental Status: She is alert.   Psychiatric:        Mood and Affect: Mood normal.        Behavior: Behavior normal.     Lab Results  Component Value Date   WBC 8.0 02/24/2024   HGB 12.6 02/24/2024   HCT 38.3 02/24/2024   PLT 157 02/24/2024   GLUCOSE 98 02/24/2024   CHOL 219 (H) 01/05/2023   TRIG 148.0 01/05/2023   HDL 52.40 01/05/2023   LDLDIRECT 165.0 06/17/2020   LDLCALC 137 (H) 01/05/2023   ALT 41 (H) 02/24/2024  AST 36 (H) 02/24/2024   NA 140 02/24/2024   K 3.7 02/24/2024   CL 103 02/24/2024   CREATININE 1.53 (H) 02/24/2024   BUN 9 02/24/2024   CO2 26 02/24/2024   TSH 0.32 (L) 02/24/2024   INR 1.2 (H) 07/08/2023   HGBA1C 4.8 01/05/2023    MM 3D SCREENING MAMMOGRAM BILATERAL BREAST Result Date: 04/29/2023 CLINICAL DATA:  Screening. EXAM: DIGITAL SCREENING BILATERAL MAMMOGRAM WITH TOMOSYNTHESIS AND CAD TECHNIQUE: Bilateral screening digital craniocaudal and mediolateral oblique mammograms were obtained. Bilateral screening digital breast tomosynthesis was performed. The images were evaluated with computer-aided detection. COMPARISON:  Previous exam(s). ACR Breast Density Category b: There are scattered areas of fibroglandular density. FINDINGS: There are no findings suspicious for malignancy. IMPRESSION: No mammographic evidence of malignancy. A result letter of this screening mammogram will be mailed directly to the patient. RECOMMENDATION: Screening mammogram in one year. (Code:SM-B-01Y) BI-RADS CATEGORY  1: Negative. Electronically Signed   By: Roda Cirri M.D.   On: 04/29/2023 09:58    Assessment & Plan:  Weight loss, unintentional -     Lipase; Future -     Amylase; Future -     Hepatic function panel; Future -     MR ABDOMEN W WO CONTRAST; Future  Stage 4 chronic kidney disease (HCC) -     Basic metabolic panel with GFR; Future -     Urinalysis, Routine w reflex microscopic;  Future  Acquired hypothyroidism- Will lower her T4 dose. -     TSH; Future -     Levothyroxine  Sodium; Take 1 tablet (150 mcg total) by mouth daily.  Dispense: 90 tablet; Refill: 0  Primary hypertension -     Basic metabolic panel with GFR; Future -     Urinalysis, Routine w reflex microscopic; Future -     CBC with Differential/Platelet; Future  NASH (nonalcoholic steatohepatitis) -     Hepatic function panel; Future  Epigastric abdominal tenderness without rebound tenderness -     Lipase; Future -     Amylase; Future -     Hepatic function panel; Future -     MR ABDOMEN W WO CONTRAST; Future  B12 deficiency -     Folate; Future  Elevated LFTs -     MR ABDOMEN W WO CONTRAST; Future  Allergic contact dermatitis due to drugs in contact with skin -     Triamcinolone  Acetonide; Apply 1 Application topically 3 (three) times daily.  Dispense: 30 g; Refill: 1  Candidal skin infection -     Ketoconazole; Apply 1 Application topically 2 (two) times daily.  Dispense: 60 g; Refill: 2     Follow-up: Return in about 3 months (around 05/26/2024).  Sandra Crouch, MD

## 2024-02-24 NOTE — Patient Instructions (Signed)
 Abdominal Pain, Adult  Pain in the abdomen (abdominal pain) can be caused by many things. In most cases, it gets better with no treatment or by being treated at home. But in some cases, it can be serious. Your health care provider will ask questions about your medical history and do a physical exam to try to figure out what is causing your pain. Follow these instructions at home: Medicines Take over-the-counter and prescription medicines only as told by your provider. Do not take medicines that help you poop (laxatives) unless told by your provider. General instructions Watch your condition for any changes. Drink enough fluid to keep your pee (urine) pale yellow. Contact a health care provider if: Your pain changes, gets worse, or lasts longer than expected. You have severe cramping or bloating in your abdomen, or you vomit. Your pain gets worse with meals, after eating, or with certain foods. You are constipated or have diarrhea for more than 2-3 days. You are not hungry, or you lose weight without trying. You have signs of dehydration. These may include: Dark pee, very little pee, or no pee. Cracked lips or dry mouth. Sleepiness or weakness. You have pain when you pee (urinate) or poop. Your abdominal pain wakes you up at night. You have blood in your pee. You have a fever. Get help right away if: You cannot stop vomiting. Your pain is only in one part of the abdomen. Pain on the right side could be caused by appendicitis. You have bloody or black poop (stool), or poop that looks like tar. You have trouble breathing. You have chest pain. These symptoms may be an emergency. Get help right away. Call 911. Do not wait to see if the symptoms will go away. Do not drive yourself to the hospital. This information is not intended to replace advice given to you by your health care provider. Make sure you discuss any questions you have with your health care provider. Document Revised:  06/17/2022 Document Reviewed: 06/17/2022 Elsevier Patient Education  2024 [employer].

## 2024-02-25 ENCOUNTER — Ambulatory Visit: Payer: Self-pay | Admitting: Internal Medicine

## 2024-02-25 DIAGNOSIS — L233 Allergic contact dermatitis due to drugs in contact with skin: Secondary | ICD-10-CM | POA: Insufficient documentation

## 2024-02-25 DIAGNOSIS — R7989 Other specified abnormal findings of blood chemistry: Secondary | ICD-10-CM | POA: Insufficient documentation

## 2024-02-25 LAB — CBC WITH DIFFERENTIAL/PLATELET
Absolute Lymphocytes: 2216 {cells}/uL (ref 850–3900)
Absolute Monocytes: 600 {cells}/uL (ref 200–950)
Basophils Absolute: 56 {cells}/uL (ref 0–200)
Basophils Relative: 0.7 %
Eosinophils Absolute: 600 {cells}/uL — ABNORMAL HIGH (ref 15–500)
Eosinophils Relative: 7.5 %
HCT: 38.3 % (ref 35.0–45.0)
Hemoglobin: 12.6 g/dL (ref 11.7–15.5)
MCH: 31.5 pg (ref 27.0–33.0)
MCHC: 32.9 g/dL (ref 32.0–36.0)
MCV: 95.8 fL (ref 80.0–100.0)
MPV: 10.8 fL (ref 7.5–12.5)
Monocytes Relative: 7.5 %
Neutro Abs: 4528 {cells}/uL (ref 1500–7800)
Neutrophils Relative %: 56.6 %
Platelets: 157 10*3/uL (ref 140–400)
RBC: 4 10*6/uL (ref 3.80–5.10)
RDW: 12.4 % (ref 11.0–15.0)
Total Lymphocyte: 27.7 %
WBC: 8 10*3/uL (ref 3.8–10.8)

## 2024-02-25 LAB — BASIC METABOLIC PANEL WITH GFR
BUN/Creatinine Ratio: 6 (calc) (ref 6–22)
BUN: 9 mg/dL (ref 7–25)
CO2: 26 mmol/L (ref 20–32)
Calcium: 10.5 mg/dL — ABNORMAL HIGH (ref 8.6–10.4)
Chloride: 103 mmol/L (ref 98–110)
Creat: 1.53 mg/dL — ABNORMAL HIGH (ref 0.60–1.00)
Glucose, Bld: 98 mg/dL (ref 65–99)
Potassium: 3.7 mmol/L (ref 3.5–5.3)
Sodium: 140 mmol/L (ref 135–146)
eGFR: 35 mL/min/{1.73_m2} — ABNORMAL LOW (ref >=60)

## 2024-02-25 LAB — HEPATIC FUNCTION PANEL
AG Ratio: 1.5 (calc) (ref 1.0–2.5)
ALT: 41 U/L — ABNORMAL HIGH (ref 6–29)
AST: 36 U/L — ABNORMAL HIGH (ref 10–35)
Albumin: 4.2 g/dL (ref 3.6–5.1)
Alkaline phosphatase (APISO): 141 U/L (ref 37–153)
Bilirubin, Direct: 0.2 mg/dL (ref 0.0–0.2)
Globulin: 2.8 g/dL (ref 1.9–3.7)
Indirect Bilirubin: 0.7 mg/dL (ref 0.2–1.2)
Total Bilirubin: 0.9 mg/dL (ref 0.2–1.2)
Total Protein: 7 g/dL (ref 6.1–8.1)

## 2024-02-25 LAB — LIPASE: Lipase: 26 U/L (ref 7–60)

## 2024-02-25 LAB — AMYLASE: Amylase: 48 U/L (ref 21–101)

## 2024-02-25 LAB — FOLATE: Folate: >24 ng/mL

## 2024-02-25 LAB — TSH: TSH: 0.32 m[IU]/L — ABNORMAL LOW (ref 0.40–4.50)

## 2024-02-25 MED ORDER — KETOCONAZOLE 2 % EX CREA
1.0000 | TOPICAL_CREAM | Freq: Two times a day (BID) | CUTANEOUS | 2 refills | Status: AC
Start: 2024-02-25 — End: ?

## 2024-02-25 MED ORDER — LEVOTHYROXINE SODIUM 150 MCG PO TABS
150.0000 ug | ORAL_TABLET | Freq: Every day | ORAL | 0 refills | Status: DC
Start: 2024-02-25 — End: 2024-05-03

## 2024-02-25 MED ORDER — TRIAMCINOLONE ACETONIDE 0.5 % EX CREA
1.0000 | TOPICAL_CREAM | Freq: Three times a day (TID) | CUTANEOUS | 1 refills | Status: DC
Start: 2024-02-25 — End: 2024-07-05

## 2024-02-29 ENCOUNTER — Ambulatory Visit: Admitting: Internal Medicine

## 2024-03-08 ENCOUNTER — Ambulatory Visit
Admission: RE | Admit: 2024-03-08 | Discharge: 2024-03-08 | Disposition: A | Discharge status: Home / Self Care | Source: Ambulatory Visit | Attending: Internal Medicine | Admitting: Internal Medicine

## 2024-03-08 DIAGNOSIS — R634 Abnormal weight loss: Secondary | ICD-10-CM

## 2024-03-08 DIAGNOSIS — R7989 Other specified abnormal findings of blood chemistry: Secondary | ICD-10-CM

## 2024-03-08 DIAGNOSIS — N281 Cyst of kidney, acquired: Secondary | ICD-10-CM | POA: Diagnosis not present

## 2024-03-08 DIAGNOSIS — R109 Unspecified abdominal pain: Secondary | ICD-10-CM | POA: Diagnosis not present

## 2024-03-08 DIAGNOSIS — K760 Fatty (change of) liver, not elsewhere classified: Secondary | ICD-10-CM | POA: Diagnosis not present

## 2024-03-08 DIAGNOSIS — R10816 Epigastric abdominal tenderness: Secondary | ICD-10-CM

## 2024-03-08 DIAGNOSIS — K8689 Other specified diseases of pancreas: Secondary | ICD-10-CM | POA: Diagnosis not present

## 2024-03-08 MED ORDER — GADOTERIDOL 279.3 MG/ML IV SOLN
7.5000 mL | Freq: Once | INTRAVENOUS | Status: AC | PRN
  Administered 2024-03-08: 7.5 mL via INTRAVENOUS

## 2024-03-28 ENCOUNTER — Telehealth: Payer: Self-pay | Admitting: Internal Medicine

## 2024-03-28 NOTE — Telephone Encounter (Signed)
 Copied from CRM [id number]. Topic: Clinical - Lab/Test Results >> Mar 28, 2024  2:15 PM Valerie Jackson wrote: Reason for CRM: Patient called in requesting a phone call to go over her MRI results. Patient can be reached at [phone].

## 2024-03-31 NOTE — Telephone Encounter (Signed)
 MRI results has been gone over for her.

## 2024-04-10 ENCOUNTER — Ambulatory Visit (INDEPENDENT_AMBULATORY_CARE_PROVIDER_SITE_OTHER): Payer: Medicare Other

## 2024-04-10 VITALS — Ht 65.0 in | Wt 173.0 lb

## 2024-04-10 DIAGNOSIS — Z0000 Encounter for general adult medical examination without abnormal findings: Secondary | ICD-10-CM

## 2024-04-10 DIAGNOSIS — K635 Polyp of colon: Secondary | ICD-10-CM

## 2024-04-10 NOTE — Progress Notes (Signed)
 Subjective:   Valerie Jackson is a 78 y.o. who presents for a Medicare Wellness preventive visit.  As a reminder, Annual Wellness Visits don't include a physical exam, and some assessments may be limited, especially if this visit is performed virtually. We may recommend an in-person follow-up visit with your provider if needed.  Visit Complete: Virtual I connected with  Valerie Jackson on 04/10/24 by a audio enabled telemedicine application and verified that I am speaking with the correct person using two identifiers.  Patient Location: Home  Provider Location: Office/Clinic  I discussed the limitations of evaluation and management by telemedicine. The patient expressed understanding and agreed to proceed.  Vital Signs: Because this visit was a virtual/telehealth visit, some criteria may be missing or patient reported. Any vitals not documented were not able to be obtained and vitals that have been documented are patient reported.  VideoDeclined- This patient declined Librarian, academic. Therefore the visit was completed with audio only.  Persons Participating in Visit: Patient.  AWV Questionnaire: No: Patient Medicare AWV questionnaire was not completed prior to this visit.  Cardiac Risk Factors include: advanced age (>70men, >16 women);dyslipidemia;hypertension     Objective:    Today's Vitals   04/10/24 1020  Weight: 173 lb (78.5 kg)  Height: 5' 5 (1.651 m)   Body mass index is 28.79 kg/m.     04/10/2024   10:20 AM 04/07/2023    9:31 AM 04/06/2022   11:44 AM 07/05/2020    4:13 PM 05/27/2020   12:55 PM 09/24/2019    9:15 PM 11/03/2017   10:31 AM  Advanced Directives  Does Patient Have a Medical Advance Directive? No No No Yes No No No   Does patient want to make changes to medical advance directive?    No - Patient declined     Would patient like information on creating a medical advance directive? Yes (MAU/Ambulatory/Procedural Areas -  Information given) No - Patient declined No - Patient declined  Yes (ED - Information included in AVS)  No - Patient declined      Data saved with a previous flowsheet row definition    Current Medications (verified) Outpatient Encounter Medications as of 04/10/2024  Medication Sig   allopurinol  (ZYLOPRIM ) 100 MG tablet Take 1 tablet (100 mg total) by mouth daily.   ALPRAZolam  (XANAX ) 0.5 MG tablet TAKE ONE TABLET BY MOUTH AT BEDTIME AS NEEDED FOR ANXIETY   Biotin 89999 MCG TABS Take 1 tablet by mouth daily.   cholecalciferol (VITAMIN D ) 1000 UNITS tablet Take 1,000 Units by mouth daily.   colchicine  0.6 MG tablet Take 1 tablet (0.6 mg total) by mouth daily.   colestipol  (COLESTID ) 1 g tablet Take 1 tablet (1 g total) by mouth 2 (two) times daily.   cyanocobalamin  (VITAMIN B12) 1000 MCG/ML injection INJECT 1 ML INTO THE MUSCLE EVERY 30 DAYS   folic acid  (FOLVITE ) 1 MG tablet Take 1 tablet (1 mg total) by mouth daily.   ketoconazole  (NIZORAL ) 2 % cream Apply 1 Application topically 2 (two) times daily.   levothyroxine  (SYNTHROID ) 150 MCG tablet Take 1 tablet (150 mcg total) by mouth daily.   potassium chloride  (KLOR-CON ) 10 MEQ tablet Take 1 tablet (10 mEq total) by mouth daily.   SYRINGE-NEEDLE, DISP, 3 ML 25G X 5/8 3 ML MISC 1 each by Does not apply route every 30 (thirty) days.   terazosin  (HYTRIN ) 2 MG capsule Take 1 capsule (2 mg total) by mouth at bedtime.  triamcinolone  cream (KENALOG ) 0.5 % Apply 1 Application topically 3 (three) times daily.   Facility-Administered Encounter Medications as of 04/10/2024  Medication   denosumab  (PROLIA ) injection 60 mg    Allergies (verified) Crestor  [rosuvastatin ] and Estrogens   History: Past Medical History:  Diagnosis Date   Arthritis    fingers   Blood clot in vein 1968   Cataracts, bilateral    CHF (congestive heart failure) (HCC)    Chronic kidney disease    CKD stage 2   COPD (chronic obstructive pulmonary disease) (HCC)     Depression    Diverticulitis 09/2013   with perforation   Dizziness    with decreased BP readings or bending over   Floaters    GERD (gastroesophageal reflux disease)    H/O hiatal hernia    Headache(784.0)    migraine every 5- to 6 years   Heart murmur since age 82   mild   Hepatitis age 16 or 9   had heapatitis, not sure which kind   History of blood transfusion 1965   History of frequent urinary tract infections    Hypercholesteremia    Hypertension    Hypothyroid    Obesity    Pericolonic abscess 2015   Pneumonia    Sickle cell trait (HCC)    Past Surgical History:  Procedure Laterality Date   CHOLECYSTECTOMY  2002   COLON RESECTION N/A 10/11/2013   Procedure: EXPLORATORY LAPAROTOMY WITH RECTOSIGMOID COLECTOMY AND END COLOSTOMY;  Surgeon: Jina Nephew, MD;  Location: [facility];  Service: General;  Laterality: N/A;   COLOSTOMY TAKEDOWN N/A 02/27/2014   Procedure: COLOSTOMY TAKEDOWN MENA HERNIA REPAIR;  Surgeon: Jina Nephew, MD;  Location: [facility];  Service: General;  Laterality: N/A;   INCISIONAL HERNIA REPAIR N/A 03/04/2016   Procedure: LAPAROSCOPIC INCISIONAL HERNIA WITH MESH;  Surgeon: Lynda Leos, MD;  Location: [facility];  Service: General;  Laterality: N/A;   LAPAROSCOPIC LYSIS OF ADHESIONS N/A 03/04/2016   Procedure: LAPAROSCOPIC LYSIS OF ADHESIONS;  Surgeon: Lynda Leos, MD;  Location: [facility];  Service: General;  Laterality: N/A;   RIGHT HEART CATH N/A 06/24/2017   Procedure: RIGHT HEART CATH;  Surgeon: Cherrie Toribio SAUNDERS, MD;  Location: [facility];  Service: Cardiovascular;  Laterality: N/A;   TONSILLECTOMY  1952   Family History  Problem Relation Age of Onset   Hypertension Mother    Aneurysm Mother        Cerebral (died from this)   Cancer Mother        Uterine   Cancer Other    Arthritis Sister    Hyperlipidemia Sister    Hypertension Sister    Stroke Sister    Kidney disease Sister        Cancer   Hyperlipidemia Brother    Glaucoma  Brother    Thyroid  cancer Daughter    Breast cancer Maternal Grandmother 17   Hypertension Maternal Grandmother    Hypercalcemia Neg Hx    Social History   Socioeconomic History   Marital status: Widowed    Spouse name: Not on file   Number of children: 1   Years of education: 14   Highest education level: Bachelor's degree (e.g., BA, AB, BS)  Occupational History   Occupation: Retired    Comment: Charity fundraiser  Tobacco Use   Smoking status: Former    Current packs/day: 0.00    Average packs/day: 0.5 packs/day for 52.0 years (26.0 ttl pk-yrs)    Types: Cigarettes  Start date: 09/06/1960    Quit date: 09/06/2012    Years since quitting: 11.6   Smokeless tobacco: Never   Tobacco comments:    never smoked a full PPD.   Vaping Use   Vaping status: Never Used  Substance and Sexual Activity   Alcohol use: No    Alcohol/week: 0.0 standard drinks of alcohol    Comment: very rarely   Drug use: No   Sexual activity: Not Currently  Other Topics Concern   Not on file  Social History Narrative   Widowed   Caffeine Use-yes   Social Drivers of Health   Financial Resource Strain: Low Risk  (04/10/2024)   Overall Financial Resource Strain (CARDIA)    Difficulty of Paying Living Expenses: Not hard at all  Food Insecurity: No Food Insecurity (04/10/2024)   Hunger Vital Sign    Worried About Running Out of Food in the Last Year: Never true    Ran Out of Food in the Last Year: Never true  Transportation Needs: No Transportation Needs (04/10/2024)   PRAPARE - Administrator, Civil Service (Medical): No    Lack of Transportation (Non-Medical): No  Physical Activity: Inactive (04/10/2024)   Exercise Vital Sign    Days of Exercise per Week: 0 days    Minutes of Exercise per Session: 0 min  Stress: Stress Concern Present (04/10/2024)   [employer] of Occupational Health - Occupational Stress Questionnaire    Feeling of Stress: To some extent  Social Connections: Unknown  (04/10/2024)   Social Connection and Isolation Panel    Frequency of Communication with Friends and Family: Once a week    Frequency of Social Gatherings with Friends and Family: Patient declined    Attends Religious Services: Never    Database administrator or Organizations: No    Attends Banker Meetings: Never    Marital Status: Widowed    Tobacco Counseling Counseling given: No Tobacco comments: never smoked a full PPD.     Clinical Intake:  Pre-visit preparation completed: Yes  Pain : No/denies pain     BMI - recorded: 28.79 Nutritional Status: BMI 25 -29 Overweight Nutritional Risks: None Diabetes: No  Lab Results  Component Value Date   HGBA1C 4.8 01/05/2023   HGBA1C 5.0 04/10/2021   HGBA1C 4.7 05/14/2014     How often do you need to have someone help you when you read instructions, pamphlets, or other written materials from your doctor or pharmacy?: 1 - Never  Interpreter Needed?: No  Information entered by :: Verdie Saba, CMA   Activities of Daily Living     04/10/2024   10:23 AM  In your present state of health, do you have any difficulty performing the following activities:  Hearing? 0  Vision? 0  Difficulty concentrating or making decisions? 0  Walking or climbing stairs? 0  Dressing or bathing? 0  Doing errands, shopping? 0  Preparing Food and eating ? N  Using the Toilet? N  In the past six months, have you accidently leaked urine? N  Do you have problems with loss of bowel control? N  Managing your Medications? N  Managing your Finances? N  Housekeeping or managing your Housekeeping? N    Patient Care Team: Joshua Debby CROME, MD as PCP - General (Internal Medicine) Kassie Mallick, MD (Inactive) as Consulting Physician (Endocrinology) Winfred Curlee DEL, MD (Inactive) as Consulting Physician (Gynecology) Abran Norleen SAILOR, MD as Consulting Physician (Gastroenterology) Georgia Blonder, MD as  Consulting Physician (Obstetrics and  Gynecology) Patrcia Sharper, MD as Consulting Physician (Ophthalmology) Merceda Lela SAUNDERS, [facility] (Pharmacist)  I have updated your Care Teams any recent Medical Services you may have received from other providers in the past year.     Assessment:   This is a routine wellness examination for Valerie Jackson.  Hearing/Vision screen Hearing Screening - Comments:: Denies hearing difficulties   Vision Screening - Comments:: Wears eyeglasses for reading only - up to date with routine eye exams with     Goals Addressed               This Visit's Progress     Patient Stated (pt-stated)        Patient stated she plans to exercise more       Depression Screen     04/10/2024   10:24 AM 02/24/2024    8:25 AM 10/06/2023   10:31 AM 07/08/2023    1:25 PM 04/07/2023    9:32 AM 01/05/2023   11:28 AM 04/06/2022   11:49 AM  PHQ 2/9 Scores  PHQ - 2 Score 0 0  5 3 2  0  PHQ- 9 Score 0   15 8 10    Exception Documentation   Patient refusal        Fall Risk     04/10/2024   10:24 AM 02/24/2024    8:25 AM 10/06/2023   10:31 AM 07/08/2023    1:25 PM 04/07/2023    9:13 AM  Fall Risk   Falls in the past year? 0 0 0 0 0  Number falls in past yr: 0 0 0 0 0  Injury with Fall? 0 0 0 0 0  Risk for fall due to : No Fall Risks No Fall Risks No Fall Risks No Fall Risks No Fall Risks  Follow up Falls evaluation completed;Falls prevention discussed Falls evaluation completed Falls evaluation completed Falls evaluation completed Falls prevention discussed    MEDICARE RISK AT HOME:  Medicare Risk at Home Any stairs in or around the home?: Yes If so, are there any without handrails?: No Home free of loose throw rugs in walkways, pet beds, electrical cords, etc?: Yes Adequate lighting in your home to reduce risk of falls?: Yes Life alert?: No Use of a cane, walker or w/c?: No Grab bars in the bathroom?: No Shower chair or bench in shower?: No Elevated toilet seat or a handicapped toilet?: No  TIMED UP AND  GO:  Was the test performed?  No  Cognitive Function: 6CIT completed        04/10/2024   10:26 AM 04/07/2023    9:14 AM 04/06/2022   11:57 AM 07/05/2020    4:15 PM  6CIT Screen  What Year? 0 points 0 points 0 points 0 points  What month? 0 points 0 points 0 points 0 points  What time? 0 points 0 points 0 points 0 points  Count back from 20  0 points 0 points 0 points  Months in reverse  0 points 0 points 0 points  Repeat phrase 2 points 0 points 0 points 0 points  Total Score  0 points 0 points 0 points    Immunizations Immunization History  Administered Date(s) Administered   Fluad Quad(high Dose 65+) 05/26/2019, 06/17/2020, 07/14/2021, 08/13/2022   Influenza, High Dose Seasonal PF 07/19/2013, 09/28/2018   PFIZER(Purple Top)SARS-COV-2 Vaccination 12/09/2019, 12/30/2019, 08/15/2020   Pneumococcal Conjugate-13 07/19/2013   Pneumococcal Polysaccharide-23 09/25/2015   Td 09/15/2011    Screening Tests Health  Maintenance  Topic Date Due   Zoster Vaccines- Shingrix (1 of 2) Never done   COVID-19 Vaccine (4 - 2024-25 season) 05/16/2023   Colonoscopy  03/14/2024   Lung Cancer Screening  10/05/2024 (Originally 01/17/2020)   DTaP/Tdap/Td (2 - Tdap) 10/05/2024 (Originally 09/14/2021)   INFLUENZA VACCINE  04/14/2024   Medicare Annual Wellness (AWV)  04/10/2025   Pneumococcal Vaccine: 50+ Years  Completed   DEXA SCAN  Completed   Hepatitis C Screening  Completed   Hepatitis B Vaccines  Aged Out   HPV VACCINES  Aged Out   Meningococcal B Vaccine  Aged Out    Health Maintenance  Health Maintenance Due  Topic Date Due   Zoster Vaccines- Shingrix (1 of 2) Never done   COVID-19 Vaccine (4 - 2024-25 season) 05/16/2023   Colonoscopy  03/14/2024   Health Maintenance Items Addressed: Referral sent to GI for colonoscopy  Additional Screening:  Vision Screening: Recommended annual ophthalmology exams for early detection of glaucoma and other disorders of the eye. Would you like a  referral to an eye doctor? No    Dental Screening: Recommended annual dental exams for proper oral hygiene  Community Resource Referral / Chronic Care Management: CRR required this visit?  No   CCM required this visit?  No   Plan:    I have personally reviewed and noted the following in the patient's chart:   Medical and social history Use of alcohol, tobacco or illicit drugs  Current medications and supplements including opioid prescriptions. Patient is not currently taking opioid prescriptions. Functional ability and status Nutritional status Physical activity Advanced directives List of other physicians Hospitalizations, surgeries, and ER visits in previous 12 months Vitals Screenings to include cognitive, depression, and falls Referrals and appointments  In addition, I have reviewed and discussed with patient certain preventive protocols, quality metrics, and best practice recommendations. A written personalized care plan for preventive services as well as general preventive health recommendations were provided to patient.   Verdie CHRISTELLA Saba, CMA   04/10/2024   After Visit Summary: (MyChart) Due to this being a telephonic visit, the after visit summary with patients personalized plan was offered to patient via MyChart   Notes: Nothing significant to report at this time.

## 2024-04-10 NOTE — Patient Instructions (Signed)
 Valerie Jackson , Thank you for taking time out of your busy schedule to complete your Annual Wellness Visit with me. I enjoyed our conversation and look forward to speaking with you again next year. I, as well as your care team,  appreciate your ongoing commitment to your health goals. Please review the following plan we discussed and let me know if I can assist you in the future. Your Game plan/ To Do List    Referrals: If you haven't heard from the office you've been referred to, please reach out to them at the phone provided.  Referral to Dr Abran [facility]) for a repeat Colonoscopy Follow up Visits: Next Medicare AWV with our clinical staff: pt declined [place]   Have you seen your provider in the last 6 months (3 months if uncontrolled diabetes)? No Next Office Visit with your provider: no pending appointment  Clinician Recommendations:  Aim for 30 minutes of exercise or brisk walking, 6-8 glasses of water, and 5 servings of fruits and vegetables each day. Educated and advised on getting the Tdap and COVID prior to the move out the country.      This is a list of the screening recommended for you and due dates:  Health Maintenance  Topic Date Due   Zoster (Shingles) Vaccine (1 of 2) Never done   COVID-19 Vaccine (4 - 2024-25 season) 05/16/2023   Colon Cancer Screening  03/14/2024   Screening for Lung Cancer  10/05/2024*   DTaP/Tdap/Td vaccine (2 - Tdap) 10/05/2024*   Flu Shot  04/14/2024   Medicare Annual Wellness Visit  04/10/2025   Pneumococcal Vaccine for age over 63  Completed   DEXA scan (bone density measurement)  Completed   Hepatitis C Screening  Completed   Hepatitis B Vaccine  Aged Out   HPV Vaccine  Aged Out   Meningitis B Vaccine  Aged Out  *Topic was postponed. The date shown is not the original due date.    Advanced directives: (Declined) Advance directive discussed with you today. Even though you declined this today, please call our office  should you change your mind, and we can give you the proper paperwork for you to fill out. Advance Care Planning is important because it:  [x]  Makes sure you receive the medical care that is consistent with your values, goals, and preferences  [x]  It provides guidance to your family and loved ones and reduces their decisional burden about whether or not they are making the right decisions based on your wishes.  Follow the link provided in your after visit summary or read over the paperwork we have mailed to you to help you started getting your Advance Directives in place. If you need assistance in completing these, please reach out to us  so that we can help you!

## 2024-04-25 ENCOUNTER — Other Ambulatory Visit: Payer: Self-pay | Admitting: Internal Medicine

## 2024-04-25 DIAGNOSIS — E538 Deficiency of other specified B group vitamins: Secondary | ICD-10-CM

## 2024-05-02 ENCOUNTER — Other Ambulatory Visit: Payer: Self-pay | Admitting: Internal Medicine

## 2024-05-02 DIAGNOSIS — M109 Gout, unspecified: Secondary | ICD-10-CM

## 2024-05-02 DIAGNOSIS — E039 Hypothyroidism, unspecified: Secondary | ICD-10-CM

## 2024-05-19 ENCOUNTER — Other Ambulatory Visit: Payer: Self-pay | Admitting: Internal Medicine

## 2024-05-19 DIAGNOSIS — M1A09X0 Idiopathic chronic gout, multiple sites, without tophus (tophi): Secondary | ICD-10-CM

## 2024-05-19 DIAGNOSIS — E876 Hypokalemia: Secondary | ICD-10-CM

## 2024-06-07 ENCOUNTER — Encounter: Payer: Self-pay | Admitting: Internal Medicine

## 2024-06-07 ENCOUNTER — Ambulatory Visit (INDEPENDENT_AMBULATORY_CARE_PROVIDER_SITE_OTHER): Admitting: Internal Medicine

## 2024-06-07 VITALS — BP 124/86 | HR 98 | Temp 98.4°F | Resp 16 | Ht 65.0 in | Wt 164.2 lb

## 2024-06-07 DIAGNOSIS — E538 Deficiency of other specified B group vitamins: Secondary | ICD-10-CM | POA: Diagnosis not present

## 2024-06-07 DIAGNOSIS — E039 Hypothyroidism, unspecified: Secondary | ICD-10-CM | POA: Diagnosis not present

## 2024-06-07 DIAGNOSIS — R002 Palpitations: Secondary | ICD-10-CM | POA: Diagnosis not present

## 2024-06-07 DIAGNOSIS — Z23 Encounter for immunization: Secondary | ICD-10-CM

## 2024-06-07 DIAGNOSIS — R7989 Other specified abnormal findings of blood chemistry: Secondary | ICD-10-CM | POA: Diagnosis not present

## 2024-06-07 DIAGNOSIS — E876 Hypokalemia: Secondary | ICD-10-CM

## 2024-06-07 DIAGNOSIS — K7581 Nonalcoholic steatohepatitis (NASH): Secondary | ICD-10-CM

## 2024-06-07 DIAGNOSIS — N184 Chronic kidney disease, stage 4 (severe): Secondary | ICD-10-CM

## 2024-06-07 DIAGNOSIS — J439 Emphysema, unspecified: Secondary | ICD-10-CM

## 2024-06-07 MED ORDER — COVID-19 MRNA VAC-TRIS(PFIZER) 30 MCG/0.3ML IM SUSY
0.3000 mL | PREFILLED_SYRINGE | Freq: Once | INTRAMUSCULAR | 0 refills | Status: AC
Start: 2024-06-07 — End: 2024-06-07

## 2024-06-07 NOTE — Progress Notes (Signed)
 "  Subjective:  Patient ID: Valerie Jackson, female    DOB: 12-Aug-1946  Age: 78 y.o. MRN: [id number]  CC: Medical Management of Chronic Issues (Discuss prolia  injections )   HPI [name] presents for f/up ----  Discussed the use of AI scribe software for clinical note transcription with the patient, who gave verbal consent to proceed.  History of Present Illness Valerie Jackson is a 78 year old female who presents with weight loss and lack of appetite.  She has experienced significant weight loss and a lack of appetite, leading to increased fatigue and weakness. No nausea, vomiting, or abdominal pain. Dizziness is rare.  She describes episodes approximately once every other week where she feels her heart is 'doing something' unusual, located on one side, accompanied by unexplained nervousness.  She reports discomfort in her left hip and knee, requiring specific sleeping positions to avoid discomfort. She has attempted exercises like chair yoga but gets winded easily.  She occasionally feels tremulous. She reports no current dizziness or lightheadedness, but does have weakness.     Outpatient Medications Prior to Visit  Medication Sig Dispense Refill   allopurinol  (ZYLOPRIM ) 100 MG tablet TAKE TWO TABLETS BY MOUTH DAILY 180 tablet 0   ALPRAZolam  (XANAX ) 0.5 MG tablet TAKE ONE TABLET BY MOUTH AT BEDTIME AS NEEDED FOR ANXIETY 30 tablet 2   Biotin 10000 MCG TABS Take 1 tablet by mouth daily.     cholecalciferol (VITAMIN D ) 1000 UNITS tablet Take 1,000 Units by mouth daily.     colchicine  0.6 MG tablet Take 1 tablet (0.6 mg total) by mouth daily. 90 tablet 0   colestipol  (COLESTID ) 1 g tablet Take 1 tablet (1 g total) by mouth 2 (two) times daily. 180 tablet 0   cyanocobalamin  (VITAMIN B12) 1000 MCG/ML injection INJECT 1 ML INTO THE MUSCLE EVERY 30 DAYS 4 mL 0   folic acid  (FOLVITE ) 1 MG tablet Take 1 tablet (1 mg total) by mouth daily. 90 tablet 0   ketoconazole  (NIZORAL ) 2 %  cream Apply 1 Application topically 2 (two) times daily. 60 g 2   levothyroxine  (SYNTHROID ) 150 MCG tablet Take 1 tablet (150 mcg total) by mouth daily. 90 tablet 0   SYRINGE-NEEDLE, DISP, 3 ML 25G X 5/8 3 ML MISC 1 each by Does not apply route every 30 (thirty) days. 50 each 0   triamcinolone  cream (KENALOG ) 0.5 % Apply 1 Application topically 3 (three) times daily. 30 g 1   potassium chloride  (KLOR-CON ) 10 MEQ tablet Take 1 tablet (10 mEq total) by mouth daily. 90 tablet 0   terazosin  (HYTRIN ) 2 MG capsule Take 1 capsule (2 mg total) by mouth at bedtime. 90 capsule 0   denosumab  (PROLIA ) injection 60 mg      No facility-administered medications prior to visit.    ROS Review of Systems  Constitutional:  Negative for appetite change, chills, diaphoresis, fatigue and fever.  HENT: Negative.  Negative for trouble swallowing.   Eyes: Negative.   Respiratory: Negative.  Negative for cough, chest tightness, shortness of breath and wheezing.   Cardiovascular:  Positive for palpitations. Negative for chest pain and leg swelling.  Gastrointestinal:  Negative for abdominal pain, constipation, diarrhea, nausea and vomiting.  Endocrine: Negative.   Genitourinary: Negative.  Negative for difficulty urinating.  Musculoskeletal:  Positive for arthralgias. Negative for joint swelling and myalgias.  Skin: Negative.   Neurological:  Negative for dizziness, weakness and light-headedness.  Hematological:  Negative for adenopathy. Does  not bruise/bleed easily.  Psychiatric/Behavioral: Negative.      Objective:  BP 124/86 (BP Location: Left Arm, Patient Position: Sitting, Cuff Size: Normal)   Pulse 98   Temp 98.4 F (36.9 C) (Oral)   Resp 16   Ht 5' 5 (1.651 m)   Wt 164 lb 3.2 oz (74.5 kg)   SpO2 96%   BMI 27.32 kg/m   BP Readings from Last 3 Encounters:  06/07/24 124/86  02/24/24 132/86  10/06/23 122/84    Wt Readings from Last 3 Encounters:  06/07/24 164 lb 3.2 oz (74.5 kg)  04/10/24  173 lb (78.5 kg)  02/24/24 173 lb 12.8 oz (78.8 kg)    Physical Exam Vitals reviewed.  Constitutional:      Appearance: Normal appearance.  HENT:     Nose: Nose normal.     Mouth/Throat:     Mouth: Mucous membranes are moist.  Eyes:     General: No scleral icterus.    Conjunctiva/sclera: Conjunctivae normal.  Cardiovascular:     Rate and Rhythm: Normal rate and regular rhythm.     Heart sounds: Normal heart sounds and S1 normal. No murmur heard.    No friction rub. No gallop.     Comments: EKG---- NSR, 88 bpm No LVH, Q waves, or ST/T wave changes  Unchanged Pulmonary:     Effort: Pulmonary effort is normal.     Breath sounds: No stridor. No wheezing, rhonchi or rales.  Abdominal:     General: Abdomen is flat.     Palpations: There is no mass.     Tenderness: There is no abdominal tenderness. There is no guarding.     Hernia: No hernia is present.  Musculoskeletal:        General: Normal range of motion.     Cervical back: Neck supple.     Right lower leg: No edema.     Left lower leg: No edema.  Lymphadenopathy:     Cervical: No cervical adenopathy.  Skin:    General: Skin is warm and dry.     Coloration: Skin is not pale.  Neurological:     General: No focal deficit present.     Mental Status: She is alert. Mental status is at baseline.  Psychiatric:        Mood and Affect: Mood normal.        Behavior: Behavior normal.     Lab Results  Component Value Date   WBC 8.6 06/07/2024   HGB 14.3 06/07/2024   HCT 41.8 06/07/2024   PLT 196.0 06/07/2024   GLUCOSE 96 06/07/2024   CHOL 219 (H) 01/05/2023   TRIG 148.0 01/05/2023   HDL 52.40 01/05/2023   LDLDIRECT 165.0 06/17/2020   LDLCALC 137 (H) 01/05/2023   ALT 24 06/07/2024   AST 28 06/07/2024   NA 142 06/07/2024   K 3.1 (L) 06/07/2024   CL 102 06/07/2024   CREATININE 1.55 (H) 06/07/2024   BUN 6 06/07/2024   CO2 27 06/07/2024   TSH 0.88 06/07/2024   INR 1.2 (H) 07/08/2023   HGBA1C 4.8 01/05/2023     MR Abdomen W Wo Contrast Result Date: 03/10/2024 CLINICAL DATA:  Abdominal pain and weight loss, elevated liver function tests. EXAM: MRI ABDOMEN WITHOUT AND WITH CONTRAST TECHNIQUE: Multiplanar multisequence MR imaging of the abdomen was performed both before and after the administration of intravenous contrast. CONTRAST:  7.5ml Vueway, 0 wasted COMPARISON:  PET-CT June 20, 2021, CT abdomen pelvis April 01, 2017 FINDINGS:  Lower chest: No acute findings. Hepatobiliary: Mild hepatic steatosis with hepatic fat fraction of 6.2% liver measures 16.2 cm in craniocaudal dimension. Non cirrhotic morphology. No suspicious enhancing liver lesion. Cholecystectomy. No biliary dilatation. CBD measures 6 mm. Pancreas: Atrophic changes of the pancreas. No mass, inflammatory changes, or other pancreatic ductal dilatation. Spleen: Splenomegaly measuring 13.2 cm. There are 2 T2 hyperintense lesions measuring 5 and 4 mm in upper and lower pole without significant enhancement on the postcontrast arterial phase, isointense to spleen on delayed phase sequence suggestive of small hemangiomas. These lesions were similar in size on prior CT from 2018 Adrenals/Urinary Tract: No masses identified. Multiple bilateral subcentimeter simple (Bosniak 1) this renal cortical cyst. No evidence of hydronephrosis. Stomach/Bowel: Visualized portions within the abdomen are unremarkable. Vascular/Lymphatic: No pathologically enlarged lymph nodes identified. No abdominal aortic aneurysm demonstrated. Other:  None. Musculoskeletal: No suspicious bone lesions identified. T2 hyperintense T10 and L1 hemangiomas, stable to prior CT. Tarlov cyst in sacral region IMPRESSION: Mild hepatic steatosis.  No suspicious liver lesion. Bosniak 1 renal cortical cysts which does not require imaging follow-up. Atrophic changes of the pancreas. Suggestion of 2 splenic hemangiomas, stable to prior CT from 2018. Electronically Signed   By: Megan  Zare M.D.   On:  03/10/2024 12:15   Fibrosis 4 Score = 2.3  Fib-4 interpretation is not validated for people under 35 or over 4 years of age. However, scores under 2.0 are generally considered low risk.   Assessment & Plan:  Stage 4 chronic kidney disease (HCC)- Renal function is stable. -     Basic metabolic panel with GFR; Future -     CBC with Differential/Platelet; Future -     Uric acid; Future  Elevated LFTs -     Hepatic function panel; Future  Need for immunization against influenza -     Flu vaccine HIGH DOSE PF(Fluzone Trivalent)  Palpitations- EKG is normal. The palpitations sound benign. -     EKG 12-Lead -     TSH; Future  Acquired hypothyroidism- She is euthyroid. -     TSH; Future  NASH (nonalcoholic steatohepatitis)   Hypokalemia -     Potassium Chloride  ER; Take 1 tablet (10 mEq total) by mouth daily.  Dispense: 90 tablet; Refill: 0  Other orders -     COVID-19 mRNA Vac-TriS(Pfizer); Inject 0.3 mLs into the muscle once for 1 dose.  Dispense: 0.3 mL; Refill: 0     Follow-up: Return if symptoms worsen or fail to improve.  Debby Molt, MD "

## 2024-06-07 NOTE — Patient Instructions (Signed)
 Chronic Kidney Disease in Adults: What to Know Chronic kidney disease (CKD) is when lasting damage happens to the kidneys slowly over time. The kidneys are two organs that do many important things in the body. These include: Taking waste and extra fluid out of the blood to make pee (urine). Making hormones. Keeping the right amount of fluids and chemicals in the body. A small amount of kidney damage may not cause problems. You must take steps to help keep the kidney damage from getting worse. A lot of damage may cause kidney failure. Kidney failure means the kidneys can no longer work right. What are the causes? Diabetes. High blood pressure. Diseases that affect the heart and blood vessels. Other kidney diseases. Diseases that affect the body's defense system (immune system). A problem with the flow of pee. This may be caused by: Kidney stones. Cancer. An enlarged prostate, in males. A kidney infection or urinary tract infection (UTI) that keeps coming back. What increases the risk? Getting older. The chances of having CKD increase with age. A family history of kidney disease or kidney failure. Having a disease caused by genes. Taking medicines that can harm the kidneys. Being near or having contact with harmful substances. Being very overweight. Using tobacco now or in the past. What are the signs or symptoms? Common symptoms of CKD include: Feeling very tired and having less energy. Swelling of the face, legs, ankles, or feet. Throwing up or feeling like you may throw up. Not wanting to eat as much as normal. Being confused or not able to focus. Twitches and cramps in the leg muscles or other muscles. Dry, itchy skin. Other symptoms may include: Shortness of breath. Trouble sleeping. Making less pee, or making more pee, especially at night. A taste of metal in your mouth. You may also become anemic. Anemia means there's not enough red blood cells in your blood. You may get  symptoms slowly. You may not notice them until the kidney damage gets very bad. How is this diagnosed? CKD may be diagnosed based on: Tests on your blood or pee. Imaging tests, like an ultrasound or a CT scan. A kidney biopsy. For this test, a sample of kidney tissue is removed to be looked at under a microscope. These tests will help to find out how serious the CKD is. How is this treated? Often, there's no cure for CKD. Treatment can help with symptoms and help keep the disease from getting worse. Treatment may include: Treating other problems that are causing your CKD or making it worse. Diet changes. You may need to: Avoid alcohol. Avoid foods that are high in salt, potassium, phosphorous, and protein. Taking medicines for symptoms and to help control other conditions. Dialysis. This treatment gets harmful waste out of your body. It may be needed if you have kidney failure. Follow these instructions at home: Medicines Take your medicines only as told. The amount of some medicines you take may need to be changed. Do not take any new medicines, vitamins, or supplements unless your health care provider says it's okay. These may make kidney damage worse. Lifestyle Do not smoke, vape, or use nicotine or tobacco. If you drink alcohol: Limit how much you have to: 0-1 drink a day if you're female. 0-2 drinks a day if you're female. Know how much alcohol is in your drink. In the [place] one drink is one 12 oz bottle of beer (355 mL), one 5 oz glass of wine (148 mL), or one 1 oz  glass of hard liquor (44 mL). Stay at a healthy weight. If you need help, ask your provider. General instructions  Eat and drink as told. Track your blood pressure at home. Tell your provider about any changes. If you have diabetes, track your blood sugar as told. Exercise at least 30 minutes a day, 5 days a week. Keep your shots (vaccinations) up to date. Keep all follow-up visits. Your provider may need to change  your treatments over time. Where to find support American Kidney Fund: [place].com.au Kidney School: kidneyschool.org American Association of Kidney Patients: https://www.miller-montoya.com/ Where to find more information National Kidney Foundation: kidney.org Centers for Disease Control and Prevention. To learn more: Go to DiningCalendar.de. Click "Search". Type "chronic kidney disease" in the search box. Contact a health care provider if: You have new symptoms. You get symptoms of end-stage kidney disease. These include: Headaches. Numbness in your hands or feet. Leg cramps. Easy bruising. Get help right away if: You have a fever. You make less pee than usual. You have pain or bleeding when you pee or poop. You have chest pain. You have shortness of breath. These symptoms may be an emergency. Call 911 right away. Do not wait to see if the symptoms will go away. Do not drive yourself to the hospital. This information is not intended to replace advice given to you by your health care provider. Make sure you discuss any questions you have with your health care provider. Document Revised: 07/13/2023 Document Reviewed: 03/05/2023 Elsevier Patient Education  2024 [employer].

## 2024-06-08 ENCOUNTER — Ambulatory Visit: Payer: Self-pay | Admitting: Internal Medicine

## 2024-06-08 LAB — BASIC METABOLIC PANEL WITH GFR
BUN: 6 mg/dL (ref 6–23)
CO2: 27 meq/L (ref 19–32)
Calcium: 11 mg/dL — ABNORMAL HIGH (ref 8.4–10.5)
Chloride: 102 meq/L (ref 96–112)
Creatinine, Ser: 1.55 mg/dL — ABNORMAL HIGH (ref 0.40–1.20)
GFR: 31.72 mL/min — ABNORMAL LOW (ref >60.00)
Glucose, Bld: 96 mg/dL (ref 70–99)
Potassium: 3.1 meq/L — ABNORMAL LOW (ref 3.5–5.1)
Sodium: 142 meq/L (ref 135–145)

## 2024-06-08 LAB — CBC WITH DIFFERENTIAL/PLATELET
Basophils Absolute: 0.2 K/uL — ABNORMAL HIGH (ref 0.0–0.1)
Basophils Relative: 2.1 % (ref 0.0–3.0)
Eosinophils Absolute: 0.6 K/uL (ref 0.0–0.7)
Eosinophils Relative: 7.4 % — ABNORMAL HIGH (ref 0.0–5.0)
HCT: 41.8 % (ref 36.0–46.0)
Hemoglobin: 14.3 g/dL (ref 12.0–15.0)
Lymphocytes Relative: 28.7 % (ref 12.0–46.0)
Lymphs Abs: 2.5 K/uL (ref 0.7–4.0)
MCHC: 34.3 g/dL (ref 30.0–36.0)
MCV: 92 fl (ref 78.0–100.0)
Monocytes Absolute: 0.5 K/uL (ref 0.1–1.0)
Monocytes Relative: 5.6 % (ref 3.0–12.0)
Neutro Abs: 4.8 K/uL (ref 1.4–7.7)
Neutrophils Relative %: 56.2 % (ref 43.0–77.0)
Platelets: 196 K/uL (ref 150.0–400.0)
RBC: 4.54 Mil/uL (ref 3.87–5.11)
RDW: 13 % (ref 11.5–15.5)
WBC: 8.6 K/uL (ref 4.0–10.5)

## 2024-06-08 LAB — HEPATIC FUNCTION PANEL
ALT: 24 U/L (ref 0–35)
AST: 28 U/L (ref 0–37)
Albumin: 4.5 g/dL (ref 3.5–5.2)
Alkaline Phosphatase: 126 U/L — ABNORMAL HIGH (ref 39–117)
Bilirubin, Direct: 0.2 mg/dL (ref 0.0–0.3)
Total Bilirubin: 1.2 mg/dL (ref 0.2–1.2)
Total Protein: 7.8 g/dL (ref 6.0–8.3)

## 2024-06-08 LAB — URIC ACID: Uric Acid, Serum: 4.9 mg/dL (ref 2.4–7.0)

## 2024-06-08 LAB — TSH: TSH: 0.88 u[IU]/mL (ref 0.35–5.50)

## 2024-06-09 DIAGNOSIS — E876 Hypokalemia: Secondary | ICD-10-CM | POA: Insufficient documentation

## 2024-06-09 MED ORDER — POTASSIUM CHLORIDE ER 10 MEQ PO TBCR: 10.0000 meq | EXTENDED_RELEASE_TABLET | Freq: Every day | ORAL | 0 refills | Status: DC

## 2024-07-05 ENCOUNTER — Other Ambulatory Visit: Payer: Self-pay | Admitting: Internal Medicine

## 2024-07-05 DIAGNOSIS — L233 Allergic contact dermatitis due to drugs in contact with skin: Secondary | ICD-10-CM

## 2024-07-05 NOTE — Telephone Encounter (Signed)
 Copied from CRM #[id number]. Topic: Clinical - Medication Refill >> Jul 05, 2024  9:53 AM [place] M wrote: Medication: triamcinolone  cream (KENALOG ) 0.5 %  Has the patient contacted their pharmacy? Yes (Agent: If no, request that the patient contact the pharmacy for the refill. If patient does not wish to contact the pharmacy document the reason why and proceed with request.) (Agent: If yes, when and what did the pharmacy advise?)  This is the patient's preferred pharmacy:   [id number] - [place], [place] - [address] [place] [address] Phone: [phone] Fax: [phone]  Is this the correct pharmacy for this prescription? Yes If no, delete pharmacy and type the correct one.   Has the prescription been filled recently? Yes  Is the patient out of the medication? Yes  Has the patient been seen for an appointment in the last year OR does the patient have an upcoming appointment? Yes  Can we respond through MyChart? Yes  Agent: Please be advised that Rx refills may take up to 3 business days. We ask that you follow-up with your pharmacy.

## 2024-07-13 MED ORDER — TRIAMCINOLONE ACETONIDE 0.5 % EX CREA: 1.0000 | TOPICAL_CREAM | Freq: Three times a day (TID) | CUTANEOUS | 1 refills | Status: AC

## 2024-08-12 ENCOUNTER — Encounter: Payer: Self-pay | Admitting: Internal Medicine

## 2024-08-17 ENCOUNTER — Other Ambulatory Visit: Payer: Self-pay | Admitting: Internal Medicine

## 2024-08-17 DIAGNOSIS — E538 Deficiency of other specified B group vitamins: Secondary | ICD-10-CM

## 2024-08-17 DIAGNOSIS — E876 Hypokalemia: Secondary | ICD-10-CM

## 2024-08-17 DIAGNOSIS — K9089 Other intestinal malabsorption: Secondary | ICD-10-CM

## 2024-08-17 DIAGNOSIS — E039 Hypothyroidism, unspecified: Secondary | ICD-10-CM

## 2024-08-17 DIAGNOSIS — M109 Gout, unspecified: Secondary | ICD-10-CM

## 2024-08-17 DIAGNOSIS — E785 Hyperlipidemia, unspecified: Secondary | ICD-10-CM
